# Patient Record
Sex: Male | Born: 1940 | Hispanic: Yes | Marital: Married | Smoking: Never smoker
Health system: Southern US, Community
[De-identification: ages and names within clinical notes are randomized; demographics above are authoritative.]

## PROBLEM LIST (undated history)

## (undated) DIAGNOSIS — K219 Gastro-esophageal reflux disease without esophagitis: Secondary | ICD-10-CM

## (undated) DIAGNOSIS — I10 Essential (primary) hypertension: Secondary | ICD-10-CM

## (undated) DIAGNOSIS — M1990 Unspecified osteoarthritis, unspecified site: Secondary | ICD-10-CM

## (undated) DIAGNOSIS — M549 Dorsalgia, unspecified: Secondary | ICD-10-CM

## (undated) DIAGNOSIS — D649 Anemia, unspecified: Secondary | ICD-10-CM

## (undated) DIAGNOSIS — E785 Hyperlipidemia, unspecified: Secondary | ICD-10-CM

## (undated) DIAGNOSIS — N184 Chronic kidney disease, stage 4 (severe): Secondary | ICD-10-CM

## (undated) DIAGNOSIS — E119 Type 2 diabetes mellitus without complications: Secondary | ICD-10-CM

## (undated) DIAGNOSIS — A0472 Enterocolitis due to Clostridium difficile, not specified as recurrent: Secondary | ICD-10-CM

## (undated) DIAGNOSIS — N400 Enlarged prostate without lower urinary tract symptoms: Secondary | ICD-10-CM

## (undated) HISTORY — DX: Hyperlipidemia, unspecified: E78.5

## (undated) HISTORY — PX: OTHER SURGICAL HISTORY: SHX169

## (undated) HISTORY — PX: CATARACT EXTRACTION: SUR2

## (undated) HISTORY — DX: Type 2 diabetes mellitus without complications: E11.9

## (undated) HISTORY — DX: Essential (primary) hypertension: I10

## (undated) HISTORY — DX: Chronic kidney disease, stage 4 (severe): N18.4

---

## 2004-03-15 ENCOUNTER — Other Ambulatory Visit: Payer: Self-pay

## 2004-09-15 ENCOUNTER — Encounter: Payer: Self-pay | Admitting: Family Medicine

## 2004-10-16 ENCOUNTER — Encounter: Payer: Self-pay | Admitting: Family Medicine

## 2005-07-21 ENCOUNTER — Emergency Department: Payer: Self-pay | Admitting: General Practice

## 2005-07-26 ENCOUNTER — Emergency Department: Payer: Self-pay | Admitting: Emergency Medicine

## 2005-07-28 ENCOUNTER — Emergency Department: Payer: Self-pay | Admitting: Emergency Medicine

## 2007-10-13 ENCOUNTER — Emergency Department: Payer: Self-pay | Admitting: Emergency Medicine

## 2009-03-09 ENCOUNTER — Ambulatory Visit: Payer: Self-pay | Admitting: Family Medicine

## 2009-05-31 ENCOUNTER — Emergency Department: Payer: Self-pay | Admitting: Emergency Medicine

## 2012-12-28 ENCOUNTER — Ambulatory Visit: Payer: Self-pay | Admitting: Surgery

## 2012-12-28 LAB — BASIC METABOLIC PANEL
Anion Gap: 7 (ref 7–16)
Calcium, Total: 8.6 mg/dL (ref 8.5–10.1)
Chloride: 111 mmol/L — ABNORMAL HIGH (ref 98–107)
Co2: 24 mmol/L (ref 21–32)
EGFR (African American): 34 — ABNORMAL LOW
EGFR (Non-African Amer.): 29 — ABNORMAL LOW
Glucose: 153 mg/dL — ABNORMAL HIGH (ref 65–99)
Potassium: 3.7 mmol/L (ref 3.5–5.1)
Sodium: 142 mmol/L (ref 136–145)

## 2012-12-28 LAB — CBC
HCT: 33.4 % — ABNORMAL LOW (ref 40.0–52.0)
HGB: 11.3 g/dL — ABNORMAL LOW (ref 13.0–18.0)
MCHC: 33.8 g/dL (ref 32.0–36.0)
MCV: 89 fL (ref 80–100)
Platelet: 218 10*3/uL (ref 150–440)
RBC: 3.76 10*6/uL — ABNORMAL LOW (ref 4.40–5.90)
RDW: 13.2 % (ref 11.5–14.5)
WBC: 6.8 10*3/uL (ref 3.8–10.6)

## 2012-12-28 LAB — PROTIME-INR
INR: 1
Prothrombin Time: 13.3 secs (ref 11.5–14.7)

## 2013-01-01 ENCOUNTER — Ambulatory Visit: Payer: Self-pay | Admitting: Surgery

## 2013-08-29 ENCOUNTER — Inpatient Hospital Stay: Payer: Self-pay | Admitting: Internal Medicine

## 2013-08-29 LAB — COMPREHENSIVE METABOLIC PANEL
Albumin: 2.9 g/dL — ABNORMAL LOW (ref 3.4–5.0)
Alkaline Phosphatase: 109 U/L (ref 50–136)
Anion Gap: 8 (ref 7–16)
BUN: 46 mg/dL — ABNORMAL HIGH (ref 7–18)
Calcium, Total: 7.9 mg/dL — ABNORMAL LOW (ref 8.5–10.1)
Co2: 20 mmol/L — ABNORMAL LOW (ref 21–32)
EGFR (African American): 21 — ABNORMAL LOW
EGFR (Non-African Amer.): 18 — ABNORMAL LOW
Glucose: 214 mg/dL — ABNORMAL HIGH (ref 65–99)
Osmolality: 292 (ref 275–301)
Potassium: 3.6 mmol/L (ref 3.5–5.1)
SGOT(AST): 22 U/L (ref 15–37)
SGPT (ALT): 19 U/L (ref 12–78)
Sodium: 137 mmol/L (ref 136–145)
Total Protein: 6.9 g/dL (ref 6.4–8.2)

## 2013-08-29 LAB — URINALYSIS, COMPLETE
Glucose,UR: 50 mg/dL (ref 0–75)
Ketone: NEGATIVE
Specific Gravity: 1.015 (ref 1.003–1.030)
Squamous Epithelial: 1

## 2013-08-29 LAB — CBC WITH DIFFERENTIAL/PLATELET
Basophil #: 0 10*3/uL (ref 0.0–0.1)
Basophil %: 0 %
Eosinophil #: 0 10*3/uL (ref 0.0–0.7)
Eosinophil %: 0 %
Lymphocyte %: 2.7 %
MCHC: 33.5 g/dL (ref 32.0–36.0)
MCV: 88 fL (ref 80–100)
Monocyte %: 6 %
Neutrophil #: 28.5 10*3/uL — ABNORMAL HIGH (ref 1.4–6.5)
Neutrophil %: 91.3 %
Platelet: 195 10*3/uL (ref 150–440)
RBC: 3.54 10*6/uL — ABNORMAL LOW (ref 4.40–5.90)
WBC: 31.3 10*3/uL — ABNORMAL HIGH (ref 3.8–10.6)

## 2013-08-30 LAB — BASIC METABOLIC PANEL
Anion Gap: 8 (ref 7–16)
BUN: 52 mg/dL — ABNORMAL HIGH (ref 7–18)
Chloride: 114 mmol/L — ABNORMAL HIGH (ref 98–107)
Co2: 19 mmol/L — ABNORMAL LOW (ref 21–32)
EGFR (African American): 23 — ABNORMAL LOW
EGFR (Non-African Amer.): 20 — ABNORMAL LOW
Glucose: 82 mg/dL (ref 65–99)
Osmolality: 294 (ref 275–301)

## 2013-08-30 LAB — CBC WITH DIFFERENTIAL/PLATELET
HCT: 29.8 % — ABNORMAL LOW (ref 40.0–52.0)
Lymphocyte %: 7.5 %
MCHC: 34 g/dL (ref 32.0–36.0)
MCV: 89 fL (ref 80–100)
Monocyte #: 1.8 x10 3/mm — ABNORMAL HIGH (ref 0.2–1.0)
Monocyte %: 6.2 %
Neutrophil #: 24.4 10*3/uL — ABNORMAL HIGH (ref 1.4–6.5)
Neutrophil %: 85.9 %
RBC: 3.34 10*6/uL — ABNORMAL LOW (ref 4.40–5.90)
WBC: 28.4 10*3/uL — ABNORMAL HIGH (ref 3.8–10.6)

## 2013-08-30 LAB — SODIUM, URINE, RANDOM: Sodium, Urine Random: 25 mmol/L (ref 20–110)

## 2013-08-31 LAB — BASIC METABOLIC PANEL
Calcium, Total: 7.9 mg/dL — ABNORMAL LOW (ref 8.5–10.1)
Chloride: 114 mmol/L — ABNORMAL HIGH (ref 98–107)
Co2: 20 mmol/L — ABNORMAL LOW (ref 21–32)
EGFR (Non-African Amer.): 22 — ABNORMAL LOW
Glucose: 94 mg/dL (ref 65–99)
Potassium: 3.6 mmol/L (ref 3.5–5.1)

## 2013-08-31 LAB — CBC WITH DIFFERENTIAL/PLATELET
Basophil %: 0.2 %
HCT: 27.4 % — ABNORMAL LOW (ref 40.0–52.0)
HGB: 9.3 g/dL — ABNORMAL LOW (ref 13.0–18.0)
Lymphocyte #: 1.5 10*3/uL (ref 1.0–3.6)
Lymphocyte %: 6.6 %
MCHC: 34 g/dL (ref 32.0–36.0)
Monocyte #: 1.2 x10 3/mm — ABNORMAL HIGH (ref 0.2–1.0)
Monocyte %: 5.5 %
Neutrophil #: 19.6 10*3/uL — ABNORMAL HIGH (ref 1.4–6.5)
Neutrophil %: 87.1 %
Platelet: 171 10*3/uL (ref 150–440)
RBC: 3.05 10*6/uL — ABNORMAL LOW (ref 4.40–5.90)
RDW: 14.1 % (ref 11.5–14.5)
WBC: 22.5 10*3/uL — ABNORMAL HIGH (ref 3.8–10.6)

## 2013-09-01 LAB — BASIC METABOLIC PANEL
Anion Gap: 8 (ref 7–16)
Calcium, Total: 8.1 mg/dL — ABNORMAL LOW (ref 8.5–10.1)
Chloride: 115 mmol/L — ABNORMAL HIGH (ref 98–107)
Creatinine: 2.43 mg/dL — ABNORMAL HIGH (ref 0.60–1.30)
EGFR (Non-African Amer.): 25 — ABNORMAL LOW
Glucose: 87 mg/dL (ref 65–99)
Osmolality: 293 (ref 275–301)
Potassium: 3.7 mmol/L (ref 3.5–5.1)
Sodium: 142 mmol/L (ref 136–145)

## 2013-09-01 LAB — CBC WITH DIFFERENTIAL/PLATELET
Basophil #: 0 10*3/uL (ref 0.0–0.1)
Basophil %: 0.4 %
Eosinophil #: 0.3 10*3/uL (ref 0.0–0.7)
Eosinophil %: 2.5 %
HCT: 27.3 % — ABNORMAL LOW (ref 40.0–52.0)
HGB: 9.2 g/dL — ABNORMAL LOW (ref 13.0–18.0)
MCH: 30.2 pg (ref 26.0–34.0)
Monocyte #: 0.8 x10 3/mm (ref 0.2–1.0)
Monocyte %: 6.2 %
Neutrophil #: 9 10*3/uL — ABNORMAL HIGH (ref 1.4–6.5)
Neutrophil %: 74.4 %
Platelet: 181 10*3/uL (ref 150–440)
RBC: 3.06 10*6/uL — ABNORMAL LOW (ref 4.40–5.90)

## 2013-09-02 LAB — CBC WITH DIFFERENTIAL/PLATELET
Basophil #: 0.1 10*3/uL (ref 0.0–0.1)
Basophil %: 1 %
Eosinophil #: 0.2 10*3/uL (ref 0.0–0.7)
HCT: 27 % — ABNORMAL LOW (ref 40.0–52.0)
Lymphocyte #: 1.7 10*3/uL (ref 1.0–3.6)
MCH: 30.7 pg (ref 26.0–34.0)
MCHC: 34.5 g/dL (ref 32.0–36.0)
Monocyte #: 0.7 x10 3/mm (ref 0.2–1.0)
Monocyte %: 9 %
Neutrophil #: 5.1 10*3/uL (ref 1.4–6.5)
Platelet: 184 10*3/uL (ref 150–440)
RDW: 13.7 % (ref 11.5–14.5)
WBC: 7.8 10*3/uL (ref 3.8–10.6)

## 2013-09-02 LAB — BASIC METABOLIC PANEL
Anion Gap: 7 (ref 7–16)
BUN: 36 mg/dL — ABNORMAL HIGH (ref 7–18)
Calcium, Total: 8.5 mg/dL (ref 8.5–10.1)
Chloride: 114 mmol/L — ABNORMAL HIGH (ref 98–107)
Co2: 20 mmol/L — ABNORMAL LOW (ref 21–32)
Creatinine: 2.22 mg/dL — ABNORMAL HIGH (ref 0.60–1.30)
EGFR (African American): 33 — ABNORMAL LOW
EGFR (Non-African Amer.): 28 — ABNORMAL LOW
Osmolality: 290 (ref 275–301)

## 2013-09-07 DIAGNOSIS — N401 Enlarged prostate with lower urinary tract symptoms: Secondary | ICD-10-CM | POA: Insufficient documentation

## 2014-04-13 ENCOUNTER — Emergency Department: Payer: Self-pay | Admitting: Emergency Medicine

## 2014-04-13 LAB — BASIC METABOLIC PANEL
Anion Gap: 7 (ref 7–16)
BUN: 34 mg/dL — AB (ref 7–18)
CALCIUM: 8.3 mg/dL — AB (ref 8.5–10.1)
CHLORIDE: 113 mmol/L — AB (ref 98–107)
Co2: 22 mmol/L (ref 21–32)
Creatinine: 2.5 mg/dL — ABNORMAL HIGH (ref 0.60–1.30)
GFR CALC AF AMER: 28 — AB
GFR CALC NON AF AMER: 25 — AB
Glucose: 202 mg/dL — ABNORMAL HIGH (ref 65–99)
OSMOLALITY: 296 (ref 275–301)
Potassium: 3.8 mmol/L (ref 3.5–5.1)
Sodium: 142 mmol/L (ref 136–145)

## 2014-04-13 LAB — TROPONIN I
Troponin-I: 0.09 ng/mL — ABNORMAL HIGH
Troponin-I: 0.08 ng/mL — ABNORMAL HIGH

## 2014-04-13 LAB — CBC
HCT: 30.4 % — ABNORMAL LOW (ref 40.0–52.0)
HGB: 10.1 g/dL — ABNORMAL LOW (ref 13.0–18.0)
MCH: 30.3 pg (ref 26.0–34.0)
MCHC: 33.1 g/dL (ref 32.0–36.0)
MCV: 91 fL (ref 80–100)
Platelet: 221 10*3/uL (ref 150–440)
RBC: 3.33 10*6/uL — AB (ref 4.40–5.90)
RDW: 13.6 % (ref 11.5–14.5)
WBC: 6.2 10*3/uL (ref 3.8–10.6)

## 2014-04-13 LAB — PRO B NATRIURETIC PEPTIDE: B-Type Natriuretic Peptide: 2286 pg/mL — ABNORMAL HIGH (ref 0–125)

## 2014-05-11 ENCOUNTER — Ambulatory Visit: Payer: Self-pay | Admitting: Ophthalmology

## 2015-02-01 ENCOUNTER — Inpatient Hospital Stay: Payer: Self-pay | Admitting: Internal Medicine

## 2015-02-06 ENCOUNTER — Emergency Department: Payer: Self-pay | Admitting: Emergency Medicine

## 2015-02-07 ENCOUNTER — Other Ambulatory Visit: Payer: Self-pay | Admitting: Physician Assistant

## 2015-02-07 DIAGNOSIS — R079 Chest pain, unspecified: Secondary | ICD-10-CM

## 2015-02-07 DIAGNOSIS — N189 Chronic kidney disease, unspecified: Secondary | ICD-10-CM

## 2015-02-07 DIAGNOSIS — N179 Acute kidney failure, unspecified: Secondary | ICD-10-CM

## 2015-02-07 DIAGNOSIS — I10 Essential (primary) hypertension: Secondary | ICD-10-CM

## 2015-02-10 ENCOUNTER — Inpatient Hospital Stay: Payer: Self-pay | Admitting: Internal Medicine

## 2015-02-15 ENCOUNTER — Ambulatory Visit (INDEPENDENT_AMBULATORY_CARE_PROVIDER_SITE_OTHER): Payer: Medicare Other | Admitting: Cardiovascular Disease

## 2015-02-15 ENCOUNTER — Encounter (INDEPENDENT_AMBULATORY_CARE_PROVIDER_SITE_OTHER): Payer: Self-pay

## 2015-02-15 ENCOUNTER — Encounter: Payer: Self-pay | Admitting: Cardiovascular Disease

## 2015-02-15 VITALS — BP 140/54 | HR 82 | Ht 69.0 in | Wt 194.5 lb

## 2015-02-15 DIAGNOSIS — R519 Headache, unspecified: Secondary | ICD-10-CM | POA: Insufficient documentation

## 2015-02-15 DIAGNOSIS — N184 Chronic kidney disease, stage 4 (severe): Secondary | ICD-10-CM | POA: Insufficient documentation

## 2015-02-15 DIAGNOSIS — R079 Chest pain, unspecified: Secondary | ICD-10-CM | POA: Insufficient documentation

## 2015-02-15 DIAGNOSIS — M542 Cervicalgia: Secondary | ICD-10-CM | POA: Diagnosis not present

## 2015-02-15 DIAGNOSIS — G8929 Other chronic pain: Secondary | ICD-10-CM

## 2015-02-15 DIAGNOSIS — E1165 Type 2 diabetes mellitus with hyperglycemia: Secondary | ICD-10-CM | POA: Diagnosis not present

## 2015-02-15 DIAGNOSIS — R51 Headache: Secondary | ICD-10-CM | POA: Diagnosis not present

## 2015-02-15 DIAGNOSIS — IMO0002 Reserved for concepts with insufficient information to code with codable children: Secondary | ICD-10-CM

## 2015-02-15 NOTE — Patient Instructions (Addendum)
You are doing well. No medication changes were made.  We will schedule you for a lexiscan pharmacologic myoview (stress test) [place]  Your caregiver has ordered a Stress Test with nuclear imaging. The purpose of this test is to evaluate the blood supply to your heart muscle. This procedure is referred to as a "Non-Invasive Stress Test." This is because other than having an IV started in your vein, nothing is inserted or "invades" your body. Cardiac stress tests are done to find areas of poor blood flow to the heart by determining the extent of coronary artery disease (CAD). Some patients exercise on a treadmill, which naturally increases the blood flow to your heart, while others who are  unable to walk on a treadmill due to physical limitations have a pharmacologic/chemical stress agent called Lexiscan . This medicine will mimic walking on a treadmill by temporarily increasing your coronary blood flow.   Please note: these test may take anywhere between 2-4 hours to complete  PLEASE REPORT TO [facility] AT THE FIRST DESK WILL DIRECT YOU WHERE TO GO  Date of Procedure:_____Monday, March 7______  Arrival Time for Procedure:______9:45 am__________  Instructions regarding medication:   __x__:  Hold medications as follows:__Do not take the amlodipine or metoprolol the night before or the morning of your stress test. __________  PLEASE NOTIFY THE OFFICE AT LEAST 24 HOURS IN ADVANCE IF YOU ARE UNABLE TO [place].  [phone] AND  PLEASE NOTIFY NUCLEAR MEDICINE AT [facility] AT LEAST 24 HOURS IN ADVANCE IF YOU ARE UNABLE TO KEEP YOUR APPOINTMENT. [phone]  How to prepare for your Myoview test:  1. Do not eat or drink after midnight 2. No caffeine for 24 hours prior to test 3. No smoking 24 hours prior to test. 4. Your medication may be taken with water.  If your doctor stopped a medication because of this test, do not take that  medication. 5. Ladies, please do not wear dresses.  Skirts or pants are appropriate. Please wear a short sleeve shirt. 6. No perfume, cologne or lotion. 7. Wear comfortable walking shoes. No heels!  Hold the amlodipine and metoprolol the evening before and morning of the test No food the morning of the test No caffeine for 24 hours before the test  Please call us if you have new issues that need to be addressed before your next appt.  Your physician wants you to follow-up in: 6 months.  You will receive a reminder letter in the mail two months in advance. If you don't receive a letter, please call our office to schedule the follow-up appointment.

## 2015-02-15 NOTE — Assessment & Plan Note (Signed)
Recommended close follow-up with primary care. Will likely require endocrine if unable to be controlled with his current medications

## 2015-02-15 NOTE — Assessment & Plan Note (Signed)
Severe headaches and neck pain, likely arthritis. He has seen primary care and was given Flexeril. He reports this has not helped his symptoms. He is unable to take NSAIDs secondary to underlying renal failure. Will refer to the pain clinic. Might need injections or pain medication. He denies having any x-rays or MRI

## 2015-02-15 NOTE — Assessment & Plan Note (Signed)
Suspect symptoms are from arthritis or musculoskeletal. May need imaging such as x-rays, even MRI

## 2015-02-15 NOTE — Assessment & Plan Note (Signed)
Follow-up with renal. Underlying renal failure likely secondary to long history of poor control diabetes

## 2015-02-15 NOTE — Assessment & Plan Note (Signed)
He is high risk of coronary artery disease. Frequent visits to the hospital with admission for neck pain, headache, chest pain. Stress test has been ordered to rule out ischemia

## 2015-02-15 NOTE — Progress Notes (Signed)
Patient ID: Martin Gilbert, male    DOB: 1941/07/27, 74 y.o.   MRN: [id number]  HPI Comments: [name] is a 74 year old gentleman with poorly controlled diabetes, chronic renal failure, hypertension, recently presenting to [facility] 02/07/2015 with chest pain, neck pain, headaches. He presents to establish care in the clinic He has been in the hospital 3 times in February most recently February 26 with discharge 02/12/2015 with headache  In the hospital February 17 to 02/03/2015 for acute on chronic kidney disease, hypertension, lower extremity swelling. Echocardiogram at that time showed normal ejection fraction Primary care had been treating leg edema with Lasix. Lower extremity Doppler ruled out DVT. Lasix likely contributed to worsening renal failure. Seen by renal, started on fluids, Lasix was held. Renal function improved to baseline, creatinine 3.4. Ultrasound showed chronic medical renal disease He was discharged with hydralazine.  Most recent hospital admission he had VQ scan and ruled out for PE. D-dimer was mildly elevated He presents today for discussion of stress testing. He continues to have neck pain and headaches. Occasional chest pain. CT scan of the head in the hospital showed no abnormalities He was felt his headache and neck pain were possibly from arthritis or musculoskeletal etiology. The symptoms have been ongoing for several months  hes not very active at baseline secondary to his neck and headaches.     No Known Allergies  Outpatient Encounter Prescriptions as of 02/15/2015  Medication Sig  . acetaminophen (TYLENOL) 500 MG tablet Take 500 mg by mouth every 8 (eight) hours as needed.  Marland Kitchen amLODipine (NORVASC) 2.5 MG tablet Take 2.5 mg by mouth daily.  Marland Kitchen aspirin 81 MG tablet Take 81 mg by mouth daily.  Marland Kitchen atorvastatin (LIPITOR) 20 MG tablet Take 20 mg by mouth daily.  . ciprofloxacin (CIPRO) 250 MG tablet Take 250 mg by mouth 2 (two) times daily.  .  cyclobenzaprine (FLEXERIL) 10 MG tablet Take 10 mg by mouth 3 (three) times daily as needed for muscle spasms.  Marland Kitchen doxazosin (CARDURA) 2 MG tablet Take 2 mg by mouth daily.  Marland Kitchen glipiZIDE (GLUCOTROL XL) 2.5 MG 24 hr tablet Take 2.5 mg by mouth daily with breakfast.  . hydrALAZINE (APRESOLINE) 50 MG tablet Take 50 mg by mouth 3 (three) times daily.  . metoprolol succinate (TOPROL-XL) 25 MG 24 hr tablet Take 25 mg by mouth daily.  . sodium bicarbonate 650 MG tablet Take 650 mg by mouth 4 (four) times daily.  . Vitamin D, Cholecalciferol, 1000 UNITS TABS Take by mouth.    Past Medical History  Diagnosis Date  . CKD (chronic kidney disease), stage IV   . HTN (hypertension)   . Diabetes mellitus without complication   . Hyperlipidemia     Past Surgical History  Procedure Laterality Date  . Hernia repair      Social History  reports that he has never smoked. He does not have any smokeless tobacco history on file. He reports that he does not drink alcohol or use illicit drugs.  Family History Family history is unknown by patient.    Review of Systems  Constitutional: Negative.   Respiratory: Negative.   Cardiovascular: Negative.   Gastrointestinal: Negative.   Musculoskeletal: Negative.   Skin: Negative.   Neurological: Negative.   Hematological: Negative.   Psychiatric/Behavioral: Negative.   All other systems reviewed and are negative.   BP 140/54 mmHg  Pulse 82  Ht 5\' 9"  (1.753 m)  Wt 194 lb 8 oz (88.225 kg)  BMI 28.71 kg/m2  Physical Exam  Constitutional: He is oriented to person, place, and time. He appears well-developed and well-nourished.  HENT:  Head: Normocephalic.  Nose: Nose normal.  Mouth/Throat: Oropharynx is clear and moist.  Eyes: Conjunctivae are normal. Pupils are equal, round, and reactive to light.  Neck: Normal range of motion. Neck supple. No JVD present.  Cardiovascular: Normal rate, regular rhythm, S1 normal, S2 normal, normal heart sounds and  intact distal pulses.  Exam reveals no gallop and no friction rub.   No murmur heard. Pulmonary/Chest: Effort normal and breath sounds normal. No respiratory distress. He has no wheezes. He has no rales. He exhibits no tenderness.  Abdominal: Soft. Bowel sounds are normal. He exhibits no distension. There is no tenderness.  Musculoskeletal: Normal range of motion. He exhibits no edema or tenderness.  Lymphadenopathy:    He has no cervical adenopathy.  Neurological: He is alert and oriented to person, place, and time. Coordination normal.  Skin: Skin is warm and dry. No rash noted. No erythema.  Psychiatric: He has a normal mood and affect. His behavior is normal. Judgment and thought content normal.      Assessment and Plan   Nursing note and vitals reviewed.

## 2015-02-20 ENCOUNTER — Other Ambulatory Visit: Payer: Self-pay

## 2015-02-20 ENCOUNTER — Ambulatory Visit: Payer: Self-pay | Admitting: Cardiovascular Disease

## 2015-02-20 DIAGNOSIS — R079 Chest pain, unspecified: Secondary | ICD-10-CM

## 2015-02-27 ENCOUNTER — Emergency Department: Payer: Self-pay | Admitting: Student

## 2015-04-07 NOTE — Discharge Summary (Signed)
PATIENT NAME:  Martin Gilbert, Martin [name]:  [id number] DATE OF BIRTH:  1941-06-07  DATE OF ADMISSION:  08/29/2013 DATE OF DISCHARGE:  09/02/2013  PRIMARY CARE PHYSICIAN:  [facility].  DISCHARGE DIAGNOSES: 1.  Urinary tract infection.  2.  Sepsis.  3.  Acute renal failure.  4.  Urinary retention. 5.  Diabetes mellitus.  6.  Benign prostatic hypertrophy.   CONSULTANTS:  None.   IMAGING STUDIES DONE: Include an ultrasound of the kidneys bilaterally showed moderate postvoid residual urinary bladder volume. Enlargement of the prostate gland. Normal kidneys.   ADMITTING HISTORY AND PHYSICAL:  Please see detailed H and P dictated by Dr. Lavetta Nielsen on 08/29/2013.  In brief, a 74 year old Hispanic male patient with past history type 2 diabetes mellitus, hypertension, CKD, presented to the hospital with 2 days of fever, chills and dysuria. The patient was found to have sepsis, UTI, admitted to the hospitalist service.   HOSPITAL COURSE:  Urinary tract infection with sepsis.  Urine cultures were not sent, but patient has responded well to Levaquin with no fever, normal white count. He did have acute renal failure for which ultrasound of the kidneys was done which showed no hydronephrosis secondary to his benign prostatic hypertrophy and ongoing UTI. The patient did have urinary retention for which a Foley has been placed. A Foley removal trial was done, but the patient could not void and he has been started on Flomax, Foley placed back. He will be given a leg bag and the patient will follow up with urology in 1 to 2 weeks. The patient's acute renal failure has resolved. He does have CKD stage III with baseline creatinine of 2.1.   Today, the patient does not have any abdominal pain on exam. Cardiac exam shows S1, S2, without any tachycardia or murmurs.   DISCHARGE MEDICATIONS: Include:  1.  Flomax 0.4 mg oral once a day. 2.  Doxazosin 8 mg oral once a day.  3.  Quinapril 20 mg oral once a day.   4.  Sodium bicarbonate 650 mg 2 tablets oral 2 times a day.  5.  Multivitamin 1 tablet oral once a day. 6.  Glucotrol XL 5 mg oral extended release 2 times a day.  7.  Levaquin 750 mg oral every other day for 6 days.   DISCHARGE INSTRUCTIONS:  Diabetic diet. Activity as tolerated. Follow up urology in 1 to 2 weeks regarding Foley catheter. The patient will also follow up with primary care physician in a week.   TIME SPENT:  On day of discharge in discharge activity was 45 minutes.   ____________________________ Leia Alf Sheniqua Carolan, MD srs:ce D: 09/02/2013 15:28:41 ET T: 09/02/2013 16:57:49 ET JOB#: [id number]  cc: Alveta Heimlich R. Sarahann Horrell, MD, <Dictator> Kelley MD ELECTRONICALLY SIGNED 09/06/2013 10:41

## 2015-04-07 NOTE — Op Note (Signed)
PATIENT NAME:  Martin Gilbert, Martin [name]:  [id number] DATE OF BIRTH:  09/17/1941  DATE OF PROCEDURE:  01/01/2013  PREOPERATIVE DIAGNOSIS: Umbilical hernia.   POSTOPERATIVE DIAGNOSIS: Umbilical hernia.   PROCEDURE: Umbilical hernia repair.   SURGEON: Rochel Brome, MD   ANESTHESIA: General.   INDICATIONS: This 74 year old has had bulging at the umbilicus with associated pain. Hernia was demonstrated on physical exam and appeared to be the cause of the pain, and repair was recommended for definitive treatment.   DESCRIPTION OF PROCEDURE: The patient was placed on the operating table in the supine position under general anesthesia. The abdomen was prepared with ChloraPrep and draped in a sterile manner.   A transversely oriented supraumbilical incision was made some 3 cm in length, carried down through subcutaneous tissues to encounter an umbilical hernia which consisted primarily of herniated properitoneal fat. This bulging properitoneal fat created a mass some 3 cm in dimension. It was dissected free from surrounding structures and was reduced through the fascial ring defect. The defect itself was approximately 12 mm in dimension. The properitoneal fat was dissected away from the fascia circumferentially. The fascia appeared to be somewhat thin, and I elected to use Atrium mesh which was cut to create an oval shape of some 1.5 x 2.5 cm and was placed into the properitoneal plane and sutured to the overlying fascia above and below the defect with through-and-through 0 Surgilon sutures. Next, the repair was further carried out with a transversely oriented suture line of interrupted 0 Surgilon figure-of-eight sutures, incorporating each suture into the mesh. The repair looked good. It was noted that hemostasis was intact. The tissues surrounding the repair were infiltrated with 0.5% Sensorcaine with epinephrine. The skin was closed with running 5-0 Monocryl subcuticular suture and Dermabond. The patient  tolerated surgery satisfactorily and was then prepared for transfer to the recovery room.   ____________________________ Lenna Sciara. Rochel Brome, MD jws:OSi D: 01/01/2013 14:21:01 ET T: 01/02/2013 09:29:49 ET JOB#: [id number]  cc: Loreli Dollar, MD, <Dictator> Loreli Dollar MD ELECTRONICALLY SIGNED 01/04/2013 10:24

## 2015-04-07 NOTE — H&P (Signed)
PATIENT NAME:  Martin Gilbert, Misty MR#:  [id number] DATE OF BIRTH:  06/16/1941  DATE OF ADMISSION:  08/29/2013  REFERRING PHYSICIAN:   Dr. Kaminski PRIMARY CARE PHYSICIAN: [facility]     HISTORY OF PRESENT ILLNESS:  [name] is a 74-year-old Hispanic gentleman who speaks [place] dialect of Spanish with past medical history of type 2 diabetes, hypertension as well as chronic kidney disease who is presenting with 2-day duration of fever, chills and dysuria. He also notes increased urinary frequency and describes burning sensation whenever he urinates. He also mentions abdominal pain present only when urinating which is a burning sensation, intensity ranging between 2 and 4 out of 10 without radiation. No relieving factors. He has noted subjective fevers and chills but did not actually measure his temperature at home. In the Emergency Department work-up, he was found to have urinary tract infection with marked leukocytosis above 30,000.    REVIEW OF SYSTEMS: CONSTITUTIONAL: Fever, chills as above.  He also mentions generalized fatigue and weakness.  EYES: No double vision or pain.  EARS, NOSE AND THROAT:  No hearing loss or dysphagia.  RESPIRATORY: Denies cough or shortness of breath.  CARDIOVASCULAR: No chest pain, palpitations.  GASTROINTESTINAL: No nausea, vomiting, diarrhea. Abdominal pain mentioned as above.  GENITOURINARY: Once again, positive for dysuria, increased frequency, denies any hematuria.  ENDOCRINE: Denies nocturia or thyroid problems.  HEMATOLOGIC/LYMPHATIC: Denies easy bruising or bleeding.  SKIN: Denies any rashes or lesions.  MUSCULOSKELETAL: Denies any arthritis or cramps.  NEUROLOGIC: No paralysis or paresthesias.  PSYCHIATRIC: No anxiety or depressive symptoms.  Otherwise, full review of systems performed by me, with the assistance of daughter for translation, negative.   PAST MEDICAL HISTORY: Diabetes, hypertension, chronic kidney disease.  FAMILY  HISTORY: No known history of heart disease or kidney disease.   SOCIAL HISTORY: Previously drank alcohol, has not for many years. Denies any tobacco or drug usage.   ALLERGIES: TYLENOL, WHICH CAUSES LIP SWELLING.  HOME MEDICATIONS: Doxazosin 8 mg p.o. daily, Glucotrol XL 5 mg p.o. b.i.d., multivitamin 1 tab p.o. daily, quinapril 20 mg p.o. daily, sodium bicarb 650 mg 2 tabs p.o. b.i.d.   PHYSICAL EXAMINATION: VITAL SIGNS: Temperature 99.1, heart rate 70, respirations 18, blood pressure 125/60, saturating 96% on room air. Weight 80.3 kg, BMI 29.5.  GENERAL: No acute distress, awake, alert and oriented. Speaks [place], requires translation through his daughter who is at bedside.  Our Spanish interpreter is unable to speak the dialect of Spanish, and we have no interpreters available via telephone.  HEENT: Normocephalic, atraumatic. Extraocular muscles intact. Pupils equal, round, reactive to light as well as accommodation. Dry mucosal membranes.  CARDIOVASCULAR: S1, S2, regular rate and rhythm. No murmurs, rubs or gallops.  PULMONARY: Clear to auscultation bilaterally without wheezes, rubs or rhonchi.  ABDOMEN: Soft, nontender, nondistended with positive bowel sounds. There is no CVA tenderness noted.  EXTREMITIES: Reveal no cyanosis, edema or clubbing.  NEUROLOGIC: Cranial nerves II through XII intact. No gross focal neurological deficits.   LABORATORY DATA: Sodium 137, potassium 3.6, chloride 109, bicarb 20, BUN 46, creatinine 3.16 with the last known reading of 2.18 in January of 2014. Calcium of 7.9, protein 6.9, albumin 2.9, bilirubin 2.4, alk phos 109, AST 22, ALT 19. WBC 31.3, which is neutrophil predominant, absolute neutrophil count to 28.5, hemoglobin 10.5, MCV of 88, RDW of 14, platelets 195. Urinalysis: Urine turbid with glucosuria of 50, protein 150, leukocyte esterase 3+, nitrite negative, RBCs 140, WBC is 965, bacteria   3+, epithelial cells 1.   ASSESSMENT AND PLAN: A 74-year-old  Hispanic gentleman who speaks [place], history of diabetes, hypertension, chronic kidney disease, presenting with 2 days' duration of fevers, chills, dysuria.   1.  Sepsis secondary to urinary source, meets sepsis criteria with presenting heart rate as well as white count. Panculture including blood and urine. Follow culture data.  He has been given Levaquin thus far. We will continue that for antibiotic coverage. IV fluids. Keep MAP greater than 65. 2.  Acute kidney injury on chronic kidney disease, likely prerenal in nature.  Check urine sodium and creatinine to calculate FENa as well as order a renal ultrasound. Continue IV fluid hydration. Hold ACE inhibitor and avoid further nephrotoxic agents.  3.  Hypertension. Hold his ACE inhibitor.  If needed, can use hydralazine versus Norvasc. 4.  Type 2 diabetes. Hold p.o. agents, place on insulin sliding scale.   CODE STATUS:  The patient is a FULL CODE.     TIME SPENT: 33 minutes.   ____________________________  K. , MD dkh:cb D: 08/29/2013 23:33:50 ET T: 08/29/2013 23:46:54 ET JOB#: [id number]  cc:  K. , MD, <Dictator>  K  MD ELECTRONICALLY SIGNED 08/30/2013 1:32 

## 2015-04-08 ENCOUNTER — Emergency Department
Admit: 2015-04-08 | Disposition: A | Discharge status: Home / Self Care | Payer: Self-pay | Admitting: Emergency Medicine

## 2015-04-08 LAB — BASIC METABOLIC PANEL
ANION GAP: 5 — AB (ref 7–16)
BUN: 60 mg/dL — AB
CHLORIDE: 108 mmol/L
Calcium, Total: 8.5 mg/dL — ABNORMAL LOW
Co2: 25 mmol/L
Creatinine: 4.04 mg/dL — ABNORMAL HIGH
EGFR (African American): 16 — ABNORMAL LOW
EGFR (Non-African Amer.): 14 — ABNORMAL LOW
Glucose: 181 mg/dL — ABNORMAL HIGH
POTASSIUM: 4 mmol/L
Sodium: 138 mmol/L

## 2015-04-08 LAB — CBC
HCT: 28.5 % — ABNORMAL LOW (ref 40.0–52.0)
HGB: 9.5 g/dL — AB (ref 13.0–18.0)
MCH: 30.3 pg (ref 26.0–34.0)
MCHC: 33.4 g/dL (ref 32.0–36.0)
MCV: 91 fL (ref 80–100)
PLATELETS: 302 10*3/uL (ref 150–440)
RBC: 3.14 10*6/uL — AB (ref 4.40–5.90)
RDW: 13 % (ref 11.5–14.5)
WBC: 9.3 10*3/uL (ref 3.8–10.6)

## 2015-04-08 LAB — TROPONIN I: Troponin-I: 0.04 ng/mL — ABNORMAL HIGH

## 2015-04-09 LAB — HEPATIC FUNCTION PANEL A (ARMC)
Albumin: 3.1 g/dL — ABNORMAL LOW
Alkaline Phosphatase: 104 U/L
Bilirubin,Total: 0.5 mg/dL
SGOT(AST): 19 U/L
SGPT (ALT): 13 U/L — ABNORMAL LOW
Total Protein: 7 g/dL

## 2015-04-09 LAB — LIPASE, BLOOD: LIPASE: 46 U/L

## 2015-04-09 LAB — TSH: THYROID STIMULATING HORM: 2.355 u[IU]/mL

## 2015-04-09 LAB — TROPONIN I: TROPONIN-I: 0.04 ng/mL — AB

## 2015-04-09 LAB — CK: CK, TOTAL: 59 U/L

## 2015-04-16 NOTE — Consult Note (Signed)
General Aspect Primary Cardiologist: New to [facility] ____________  74 year old male with history of CKD stage IV, HTN, and DM2 who was recently admitted to [facility] from 2/17-2/19 for acute on CKD and lower extremity swelling presents to [facility] this morning with continued intermittent chest pain, neck pain, and headache.  __________  PMH: 1. CKD stage IV 2. HTN 3. DM2 ___________   Present Illness 74 year old male who is spanish speaking with the above problem list presents to [facility] today with the above CC.  He has never seen a cardiologist previously. No prior ischemic evaluation. During his recent hospitalization from 2/17-2/19 for acute on CKD, accelerated HTN (182/71), and lower extremity swelling he did undergo an echo that showed an EF of 55-60%. At that time he was sent to [facility] from his PCP office 2/2 worsening renal failure and increased lower extremity swelling. PCP had increased his Lasix in an effort to diurese him. He underwent lower extremity doppler which was negative for DVT. He was seen by renal. He received IV fluids which did not significantly improve renal function. His Lasix was stopped. Eventually, his renal did return back to baseline, around 3.4. Ultrasound of his kidneys shows chronic medical renal disease, prostate enlarged, bladder distention. He was discharged with the addition of hydralazine 50 mg tid 2/2 his accelerated HTN.  He comes into [facility] today with continued intermittent chest pain, posterior neck pain, and right sided headache. The chest pain has been present since his above admission. He initially thought he slept wrong while he was admitted, but the pain persisted. He then developed the posterior neck pain and headache. Some associated SOB, nausea, palpitations, and diaphoresis the prior night. No presyncope or syncope. Currently chest pain free. Pain is not exertional as he does not exert himself ever he states. Pain occurs when at rest. He is a non-smoker. Previously  drank moderately years ago. No illicit drugs. He does not know his family medical history as his parents passed when he was young. He does not know his siblings PMH. His lower extremity swelling has resolved. His weight has remained stable. No orthopnea. His daughter is here to translate.   Upon his arrival to [facility] ED his troponin was found to be 0.13-->0.13-->0.11 (previously 0.08 x 2 during last admission, and during April 2015 troponin was 0.09). SCr 3.79. CXR with minimal subsegmental left lower lobe atelectasis. CT head negative.   Physical Exam:  GEN well developed, well nourished, no acute distress   HEENT hearing intact to voice, moist oral mucosa   NECK No masses  no JVD   RESP normal resp effort  clear BS   CARD Regular rate and rhythm  No murmur   ABD denies tenderness  soft   LYMPH negative neck   EXTR negative edema   SKIN normal to palpation   NEURO cranial nerves intact, motor/sensory function intact   PSYCH alert, A+O to time, place, person, good insight   Review of Systems:  Subjective/Chief Complaint leg swelling   General: No Complaints   Skin: No Complaints   ENT: No Complaints   Eyes: No Complaints   Neck: No Complaints   Respiratory: Short of breath   Cardiovascular: Chest pain or discomfort  Edema   Gastrointestinal: Nausea   Genitourinary: No Complaints   Vascular: No Complaints   Musculoskeletal: No Complaints   Neurologic: No Complaints   Hematologic: No Complaints   Endocrine: No Complaints   Psychiatric: No Complaints   Review  of Systems: All other systems were reviewed and found to be negative   Medications/Allergies Reviewed Medications/Allergies reviewed   Family & Social History:  Family and Social History:  Family History Negative  patient is unsure   Social History negative tobacco, negative ETOH, negative Illicit drugs   Place of Living Home     ckd stage iv:    Hyperlipidemia:    HTN:    Diabetes:    Home Medications:  Medication Instructions Status  hydrALAZINE 50 mg oral tablet 1 tab(s) orally 3 times a day Active  amLODIPine 2.5 mg oral tablet 1 tab(s) orally once a day Active  glipiZIDE 2.5 mg oral tablet, extended release 1 tab(s) orally once a day Active  Vitamin D3 1000 intl units oral tablet 1 tab(s) orally once a day Active  sodium bicarbonate 650 mg oral tablet 2 tab(s) orally 2 times a day Active  doxazosin 2 mg oral tablet 1 tab(s) orally once a day (at bedtime) Active  aspirin 81 mg oral tablet 1 tab(s) orally once a day Active   Lab Results:  Routine Chem:  22-Feb-16 22:40   Result Comment TROPONIN - RESULTS VERIFIED BY REPEAT TESTING.  - C/ JENNIFER MEADOWS '@[id number]'  02-06-15.Marland KitchenAJO  - READ-BACK PROCESS PERFORMED.  Result(s) reported on 07 Feb 2015 at 12:03AM.  Lipase 298 (Result(s) reported on 07 Feb 2015 at 12:30AM.)  Glucose, Serum 82  BUN  42  Creatinine (comp)  3.79  Sodium, Serum 141  Potassium, Serum 4.2  Chloride, Serum  111  CO2, Serum 21  Calcium (Total), Serum  7.9  Anion Gap 9  Osmolality (calc) 291  eGFR (African American)  20  eGFR (Non-African American)  17 (eGFR values <77m/min/1.73 m2 may be an indication of chronic kidney disease (CKD). Calculated eGFR, using the MRDR Study equation, is useful in  patients with stable renal function. The eGFR calculation will not be reliable in acutely ill patients when serum creatinine is changing rapidly. It is not useful in patients on dialysis. The eGFR calculation may not be applicable to patients at the low and high extremes of body sizes, pregnant women, and vegetarians.)  Cardiac:  22-Feb-16 22:40   Troponin I  0.13 (0.00-0.05 0.05 ng/mL or less: NEGATIVE  Repeat testing in 3-6 hrs  if clinically indicated. >0.05 ng/mL: POTENTIAL  MYOCARDIAL INJURY. Repeat  testing in 3-6 hrs if  clinically indicated. NOTE: An increase or decrease  of 30% or more on serial  testing suggests a clinically  important change)  23-Feb-16 04:24   Troponin I  0.13 (0.00-0.05 0.05 ng/mL or less: NEGATIVE  Repeat testing in 3-6 hrs  if clinically indicated. >0.05 ng/mL: POTENTIAL  MYOCARDIAL INJURY. Repeat  testing in 3-6 hrs if  clinically indicated. NOTE: An increase or decrease  of 30% or more on serial  testing suggests a  clinically important change)    08:28   Troponin I  0.11 (0.00-0.05 0.05 ng/mL or less: NEGATIVE  Repeat testing in 3-6 hrs  if clinically indicated. >0.05 ng/mL: POTENTIAL  MYOCARDIAL INJURY. Repeat  testing in 3-6 hrs if  clinically indicated. NOTE: An increase or decrease  of 30% or more on serial  testing suggests a  clinically important change)  Routine Coag:  22-Feb-16 22:40   D-Dimer, Quantitative 813 (INTERPRETATION <> Exclusion of Venous Thromboembolism (VTE) - OUTPATIENT ONLY       (Emergency Department or [place])             0-499 ng/ml (FEU)  :  With a low to intermediate pretest                                  probability for VTE this test result                                  excludes the diagnosis of VTE.             > 499 ng/ml (FEU)  : VTE not excluded; additional work up                                  for VTE is required. <> Testing on Inpatients and Evaluation of Disseminated Intravascular        Coagulation (DIC)             Reference Range:  0-499 ng/ml (FEU))  Routine Hem:  22-Feb-16 22:40   WBC (CBC) 7.3  RBC (CBC)  3.15  Hemoglobin (CBC)  9.9  Hematocrit (CBC)  28.6  Platelet Count (CBC) 209 (Result(s) reported on 06 Feb 2015 at 10:58PM.)  MCV 91  MCH 31.4  MCHC 34.6  RDW 13.2   EKG:  Interpretation EKG with NSR with no significant ST or T wave changes   Radiology Results:  XRay:    23-Feb-16 00:51, Chest PA and Lateral  Chest PA and Lateral   REASON FOR EXAM:    chest pain and SOB eval  COMMENTS:       PROCEDURE: DXR - DXR CHEST PA (OR AP) AND LATERAL  - Feb 07 2015 12:51AM     CLINICAL DATA:  Chest pain for 4  days.    EXAM:  CHEST  2 VIEW    COMPARISON:  04/13/2014    FINDINGS:  The cardiomediastinal contours are normal. Minimal subsegmental  atelectasis in the left lower lobe. Pulmonary vasculature is normal.  No consolidation, pleural effusion, or pneumothorax. No acute  osseous abnormalities are seen. Stable degenerative change inthe  thoracic spine.     IMPRESSION:  Minimal subsegmental left lower lobe atelectasis.      Electronically Signed    By: Jeb Levering M.D.    On: 02/07/2015 01:04         Verified By: Rollene Fare. Marisue Humble, M.D.,  CT:    23-Feb-16 00:46, CT Head Without Contrast  CT Head Without Contrast   REASON FOR EXAM:    headache x 4 days  COMMENTS:       PROCEDURE: CT  - CT HEAD WITHOUT CONTRAST  - Feb 07 2015 12:46AM     CLINICAL DATA:  Headache for 4 days.    EXAM:  CT HEAD WITHOUT CONTRAST    TECHNIQUE:  Contiguous axial images were obtained from the base of the skull  through the vertex without intravenous contrast.    COMPARISON:  None.  FINDINGS:  No intracranial hemorrhage, mass effect, or midline shift. No  hydrocephalus. The basilar cisterns are patent. Question low lying  cerebral tonsils. No evidence of territorial infarct. There is  periventricular and deep white matter hypodensity, nonspecific,  likely related to chronic small vessel ischemia. No intracranial  fluid collection. Calvarium is intact. Included paranasal sinuses  and mastoid air cells are well aerated.     IMPRESSION:  No acute intracranial abnormality.  Electronically Signed    By: Jeb Levering M.D.    On: 02/07/2015 01:02         Verified By: Rollene Fare. Marisue Humble, M.D.,  Nuclear Med:    23-Feb-16 12:21, Lung VQ Scan - Nuc Med  Lung VQ Scan - Nuc Med   REASON FOR EXAM:    elevaed d-dimer, chest pain  COMMENTS:       PROCEDURE: NM  - NM VQ LUNG SCAN  - Feb 07 2015 12:21PM     CLINICAL DATA:  Elevated D-dimer and chest pain    EXAM:  NUCLEAR  MEDICINE VENTILATION - PERFUSION LUNG SCAN    TECHNIQUE:  Ventilation images were obtained in multiple projections using  inhaled aerosol technetium 99 M DTPA. Perfusion images were obtained  in multiple projections after intravenous injection of Tc-42mMAA.  RADIOPHARMACEUTICALS:  43.3 mCi Tc-969mTPA aerosol and 4.39 mCi  Tc-9931mA    COMPARISON:  Chest x-ray from earlier in the same day    FINDINGS:  Ventilation: No focal ventilation defect.    Perfusion: No wedge shaped peripheral perfusion defects to suggest  acute pulmonary embolism.     IMPRESSION:  No evidence of pulmonary embolism.    Electronically Signed    By: MarInez CatalinaD.    On: 02/07/2015 12:45         Verified By: MAREverlene Farrier.D.,    Tylenol: Swelling   Impression [age]ar old male with history of CKD stage IV, HTN, and DM2 who [facility] acute on CKD and lower extremity swelling [facility] with continued intermittent chest pain, neck pain, and headache.   1. Atypical chest pain: -No prior ischemic evaluations -Patient is scheduled for VQ scan today -He will need Lexiscan to evaluate for high risk ischemia (will need to wait 48 hours post VQ scan),  could be done as an outpatient would follow up in clinic   2. Elevated D-dimer: VQ normal could be discharged with outpatient follow up for Lexiscan  3. HTN: -Improved from previous admission -Add Toprol 25 mg daily -Continue hydralazine 50 mg tid and amlodipine 2.5 mg daily  4. Headache and neck pain: -Unclear etiology -CT head negative -No signs of infection -Check sed rate  5. Acute on CKD stage IV: -Monitor SCr follow up with renal  6. Lower extremity swelling: -Improved -If this becomes an issue could look at changing amlodipine lasix PRN   Electronic Signatures: DunRise MuA-C)  (Signed 23-Feb-16 11:09)  Authored: General Aspect/Present Illness, History and Physical  Exam, Review of System, Family & Social History, Past Medical History, Home Medications, Labs, Radiology, Allergies, Impression/Plan GolIda RogueD)  (Signed 23-Feb-16 14:06)  Authored: General Aspect/Present Illness, History and Physical Exam, Review of System, Family & Social History, Past Medical History, Home Medications, EKG , Radiology, Impression/Plan  Co-Signer: General Aspect/Present Illness, History and Physical Exam, Review of System, Family & Social History, Past Medical History, Home Medications, Labs, Radiology, Allergies, Impression/Plan   Last Updated: 23-Feb-16 14:06 by GolIda RogueD)

## 2015-04-16 NOTE — H&P (Signed)
PATIENT NAME:  Martin Gilbert, Martin [name]:  [id number] DATE OF BIRTH:  May 31, 1941  DATE OF ADMISSION:  02/06/2015  PRIMARY CARE PHYSICIAN: Nonlocal    REFERRING PHYSICIAN: Loney Hering, MD    CHIEF COMPLAINT: Chest pain and headache for 4 days.   HISTORY OF PRESENT ILLNESS: A 74 years old Spanish-speaking gentleman had presented to the ED with above chief complaint. The patient is alert, awake, oriented. According to patient, patient has had chest pain and headache for the past 4 days. Chest pain is in the substernal area, tight, without radiation. In addition, the patient has a headache, which is constant. In addition, the patient complains of neck pain and back pain. The patient denies any nausea, vomiting, or diarrhea. No cough, sputum, or shortness of breath. The patient was just discharged 5 days ago after treatment of acute renal failure. The patient has elevated troponin 0.13 and elevated D-dimer at 813.     PAST MEDICAL HISTORY: Hypertension, diabetes, acute renal failure, and CKD.   SOCIAL HISTORY: The patient was alcoholic previously but for the last 3 to 4 years, stopped drinking. Denies any alcohol drinking, smoking, or illicit drugs. 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   FAMILY HISTORY: Negative for diabetes and CAD.   SURGICAL HISTORY: none.    ALLERGY: TYLENOL.   HOME MEDICATIONS: Vitamin D3 1000 international units 1 tablet p.o. daily, sodium bicarbonate 650 mg p.o. 2 tablets b.i.d., hydralazine 50 mg p.o. t.i.d., glypizide 2.5 mg p.o. daily, doxazosin 2 mg p.o. daily at bedtime, aspirin 81 mg daily, Norvasc 2.5 mg p.o. daily.    REVIEW OF SYSTEMS: CONSTITUTIONAL: The patient denies any fever, chills but has headache and neck pain.  EYES: No double vision or blurred vision.  EARS, NOSE, THROAT: No postnasal drip, slurred speech, or dysphagia.  CARDIOVASCULAR: Positive for chest tightness. No palpitation, orthopnea, nocturnal dyspnea. Has  leg edema.  PULMONARY: No cough, sputum, shortness of breath, or hematemesis.  GASTROINTESTINAL: No abdominal pain, nausea, vomiting, or diarrhea. No melena or bloody stool.  GENITOURINARY: No dysuria, hematuria, or incontinence but has urine frequency and difficulty to void urine.   HEMATOLOGIC: No easy bruising or bleeding.  NEUROLOGY: No syncope, loss of consciousness, or seizure.   PHYSICAL EXAMINATION: VITAL SIGNS: Temperature 98.2, blood pressure 135/65, pulse 71, O2 saturation 96% in room air.  GENERAL: The patient is alert, awake, oriented, in no acute distress.  HEENT: Pupils round, equal, react to light and accommodation.  NECK: Supple. No JVD or carotid bruit. No lymphadenopathy. No thyromegaly.  CARDIOVASCULAR: S1, S2, regular rate, rhythm. No murmurs or gallop.  PULMONARY: Bilateral air entry. No wheezing or rales. No use of accessory muscles to breathe.  ABDOMEN: Soft and no distention or tenderness. No organomegaly. Bowel sounds present.  EXTREMITIES: Bilateral lower extremity edema 1+. No clubbing or cyanosis. No calf tenderness. Bilateral pedal pulses present.  SKIN: No rash or jaundice.  NEUROLOGIC: A and O x 3. No focal deficit. Power 5/5. Sensation intact.  The patient has tenderness on the head and neck.   LABORATORY DATA: Glucose 82, BUN 42, creatinine 3.79, sodium 141, potassium 4.2, chloride 111, bicarbonate 21, lipase 298, troponin 0.13, WBC 7.3, hemoglobin 9.9, platelets 209. D-dimer 813.   Chest x-ray: Minimal subsegmental left lower lobe atelectasis. CAT scan of head: No acute intracranial abnormality. EKG showed normal sinus rhythm at 89 BPM.   IMPRESSIONS:  1.  Headache and neck pain, unclear etiology.  2.  Chest pain with  elevated troponin.  3.  Elevated D-dimer.  4.  Acute renal failure on chronic kidney disease.  5.  Diabetes.  6.  Hypertension.   PLAN OF TREATMENT:  1.  The patient will be placed for observation. Continue aspirin, give Zocor, and  follow up troponin level, give Lipitor, follow up troponin level.  2.  For elevated D-dimer, we will get a V/Q scan to rule out PE. Since the patient has acute renal failure on chronic kidney disease, we cannot get a CT angiogram at this time.  3.  For diabetes, we will start sliding scale, hold glipizide.  4.  For hypertension, continue patient's hypertension medication.  5.  Discussed the patient's condition and plan of treatment with the patient and patient's wife.   TIME SPENT: About 68 minutes.   All the information is obtained with Spanish interpreter.   The patient wants full code.   ____________________________ Demetrios Loll, MD qc:AT D: 02/07/2015 04:27:32 ET T: 02/07/2015 05:11:43 ET JOB#: [id number]  cc: Demetrios Loll, MD, <Dictator> Demetrios Loll MD ELECTRONICALLY SIGNED 02/09/2015 16:19

## 2015-04-16 NOTE — Discharge Summary (Signed)
PATIENT NAME:  Martin Gilbert, Martin [name]:  [id number] DATE OF BIRTH:  09-Apr-1941  DATE OF ADMISSION:  02/01/2015 DATE OF DISCHARGE:  02/03/2015  ADMITTING DIAGNOSES:  1.  Swelling of the lower extremity.  2.  Acute renal failure.   DISCHARGE DIAGNOSES: 1.  Acute renal failure on chronic renal failure. Likely there is progression of his renal disease. No improvement with IV hydration. Avoid diuretic therapy.  2.  Hypertension essential.  3.  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  4.  Elevated troponin, likely due to demand ischemia.  5.  Anemia, likely anemia of chronic disease.   CONSULTANTS: Dr. Lynnea Ferrier of [facility] nephrology.   PERTINENT LABORATORIES AND EVALUATIONS: Admitting glucose 197, BUN 42, creatinine 3.57, sodium 142, potassium 4.3, chloride 109, CO2 of 25, calcium 8.3. LFTs showed a total protein of 5.8, albumin 2.3, bilirubin total 0.5, alkaline phosphatase 108, troponin was 0.08, WBC 7.2, hemoglobin 10.7, platelet count was 233,000. X-ray of the foot shows no acute abnormality. Bilateral Doppler of the lower extremities shows no evidence of DVT. Ultrasound of his kidneys shows chronic medical renal disease, prostate enlarged, bladder distention.   HOSPITAL COURSE: Please refer to H and P done by the admitting physician. The patient is a 74 year old Spanish male who was sent from his primary care's office due to worsening renal failure. The patient recently had Lasix increased due to swelling of the lower extremities. The patient came to the ED also with complaint of swelling of the legs. He was noted to have a creatinine that was worsening. He was given aggressive IV fluids. By day 2 his renal function did not show much improvement. He was seen by primary nephrology team. They recommended stopping the IV fluids, stopping his Lasix. The patient's renal function remains stable but elevated compared to baseline. He needs this monitored. He has an appointment on March 2  which he needs to keep. At that time, renal function will need to be repeated. At this time he is stable for discharge.   DISCHARGE MEDICATIONS: Amlodipine 2.5 p.o. daily, glipizide 2.5 mg 1 tab p.o. daily, vitamin D3 at 1000 international units daily, sodium bicarbonate 650 two tablets 2 times a day, doxazosin 2 mg at bedtime, aspirin 81 mg 1 tab p.o. daily, hydralazine 50 one tablet p.o. t.i.d.   DIET: Low-sodium, low-fat, low-cholesterol, carbohydrate -controlled diet.   ACTIVITY: As tolerated.   DISCHARGE FOLLOWUP: Follow with [facility] nephrology on 03/02 as previously scheduled. The patient to wear compression stockings at all times. If they become uncomfortable, okay to take off a few hours.    TIME SPENT: 35 minutes on this discharge.   ____________________________ Lafonda Mosses. Posey Pronto, MD shp:at D: 02/03/2015 14:59:00 ET T: 02/03/2015 15:55:06 ET JOB#: [id number]  cc: Marieelena Bartko H. Posey Pronto, MD, <Dictator> Alric Seton MD ELECTRONICALLY SIGNED 02/11/2015 10:32

## 2015-04-16 NOTE — H&P (Signed)
PATIENT NAME:  Martin Gilbert, Martin [name]:  [id number] DATE OF BIRTH:  27-Apr-1941  DATE OF ADMISSION:  02/01/2015  PRIMARY CARE PHYSICIAN: [facility]    PRIMARY NEPHROLOGIST: At [facility]   REFERRING EMERGENCY ROOM PHYSICIAN: Dr. Archie Balboa.   CHIEF COMPLAINT: Swelling on the legs.   HISTORY OF PRESENT ILLNESS: A 74 year old Spanish-speaking man, who has a history of diabetes, hypertension, and chronic kidney disease. Lives with family. He has been having swelling on his legs for the last few days or weeks, so he went to his doctor's office at [facility] and they gave him Lasix, 3 tablets, which he took for the last 3 days. He also had complained of some pain on his foot, which is mainly on the left side and the left leg also appears to be a little more swollen compared to the right one. So finally, after the 3 days of Lasix therapy when he did not benefit that much, he decided to come to the Emergency Room. In the ER, he was noted to have worsening in his baseline renal function. Creatinine was around 2.5 baseline and noted to have more than 3 over here so  hospitalist team for further management of this issue.   I obtained history from patient and his family with the help of Spanish translation from the hospital and the patient denies any fall or injury to his left leg. He denies any shortness of breath or orthopnea symptoms or any palpitations or chest pain. He denies any cough, and he claims to be taking all of his medications, regular basis.   REVIEW OF SYSTEMS:  CONSTITUTIONAL: Negative for fever, fatigue, weakness, pain or weight loss.  EYES: No blurring, double vision, discharge or redness.  EARS, NOSE, THROAT: No tinnitus, ear pain or hearing loss.  RESPIRATORY: No cough, wheezing, shortness of breath and.  CARDIOVASCULAR: No chest pain, orthopnea, edema, arrhythmia, palpitation.  GASTROINTESTINAL: No nausea, vomiting, diarrhea, abdominal pain.  GENITOURINARY: No  dysuria, hematuria, increased frequency.  ENDOCRINE: No heat or cold intolerance.  SKIN: No excessive sweating, rashes, or lesions.  MUSCULOSKELETAL: No pain or swelling in the joints, except for his left side foot.  NEUROLOGICAL: No numbness, weakness, tremor or vertigo.  PSYCHIATRIC: No anxiety, insomnia, bipolar disorder.   PAST MEDICAL HISTORY:  1. Hypertension.  2. Diabetes.  3. Chronic renal failure.   SOCIAL HISTORY: He was an alcoholic previously, but for the last 3 to 4 years, stopped drinking. Denies smoking or illegal drug use. 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   FAMILY HISTORY: Negative for diabetes and coronary artery disease.   HOME MEDICATIONS:  1. Vitamin D3, 1000 international units once a day.  2. Sodium bicarbonate 650 mg 2 tablets 2 times a day.  3. Glipizide 2.5 mg oral once a day.  4. Furosemide 20 mg oral once a day.  5. Doxazosin 2 mg oral tablet once a day.  6. Aspirin 81 mg once a day.  7. Amlodipine 2.5 mg once a day.   PHYSICAL EXAMINATION:  VITAL SIGNS: Temperature 98.6, pulse 62, respirations 19, blood pressure 182/71, and pulse oximetry is 97% on room air.  GENERAL: The patient is fully alert and oriented to time, place, and person. Does not appear in any acute distress.  HEENT: Head and neck atraumatic. Conjunctivae pink. Oral mucosa moist.  NECK: Supple. No JVD. Thyroid nontender.  RESPIRATORY: Bilateral equal and clear air entry.  CARDIOVASCULAR: S1, S2 present,  regular. No murmur appreciated.  ABDOMEN: Soft, nontender. Bowel sounds present. No organomegaly.  SKIN: No acne, rashes, or lesions.  MUSCULOSKELETAL: No tenderness or swelling in the joints.  NEUROLOGICAL: Power 5/5, follows commands and moves all four limbs. No tremor or rigidity.  LEGS: On the left side leg, there is more edema compared to the right one. The edema is pitting and he has some tenderness present on his foot. Pulsations are  intact in both side dorsalis pedis. There is no redness of the skin. Tenderness is present on left side foot on the dorsal and ventral aspect.  PSYCHIATRIC: Does not appear in acute psychiatric illness at this time.   IMPORTANT LABORATORY RESULTS: Troponin 0.08, WBC 7.2, hemoglobin 10.7, platelet count 233,000, MCV is 91. Glucose 197, BUN 42, creatinine 3.57, sodium 142, potassium 4.3, chloride 109, CO2 25. Calcium is 8.3. BNP is 1338.   Left foot x-ray shows no acute finding.   ASSESSMENT AND PLAN: A 74 year old male, who has past history of chronic renal failure and hypertension and diabetes, who came to the Emergency Room with complaints of leg swelling for the last few days and was given Lasix as outpatient, has worsening in renal function.   1. Acute on chronic renal failure. Creatinine is worsening. Will stop his Lasix and continue monitoring. Does not want to give any IV fluids because of renal issues and elevated BNP. We will get consult of nephrology with his primary nephrology team at [facility]  2. Leg edema. This problem the patient has had for the last few days to a week. We will check an echocardiogram to rule out any cardiac weakness that and we will also get deep vein thrombosis studies. Meanwhile, we will continue monitoring.  3. Hypertension. We will continue his home medications.  4. Diabetes. We will stop glipizide because of the renal failure and continue insulin on sliding scale coverage.   CODE STATUS: Full code.   TOTAL TIME SPENT ON THIS ADMISSION: 50 minutes.   ____________________________ Ceasar Lund Anselm Jungling, MD vgv:ap D: 02/01/2015 21:43:37 ET T: 02/01/2015 22:46:26 ET JOB#: [id number]  cc: Ceasar Lund. Anselm Jungling, MD, <Dictator> [place] MD ELECTRONICALLY SIGNED 02/27/2015 12:49

## 2015-04-16 NOTE — Discharge Summary (Signed)
PATIENT NAME:  LANGLEY, KROM MR#:  [id number] DATE OF BIRTH:  1941-07-26  DATE OF ADMISSION:  02/06/2015 DATE OF DISCHARGE:  02/07/2015  DISCHARGE DIAGNOSES:  1.  Musculoskeletal chest pain,  diabetes mellitus,  headache, and neck pain.  2.  Hypertension.  3.  Chronic kidney disease stage 4.   DISCHARGE MEDICATIONS:   1.  Glipizide 2.5 mg p.o. daily.  2.  Vitamin D 1000 units once a day.  3.  Sodium bicarbonate 650 mg 2 tablets p.o. b.i.d.  4.  Doxazocin 2 mg p.o. daily.  5.  Aspirin 81 mg p.o. daily.  6.  Hydralazine 50 mg p.o. t.i.d.  7.  Amlodipine 2.5 mg p.o. daily.  8.  Atorvastatin 20 mg p.o. daily.  9.  Metoprolol succinate 25 mg daily (new medication)  DIET: Low-sodium bite.  CONSULTATION: Cardiology consult with Dr. Rockey Situ from [place].  The patient went to follow Dr. Rockey Situ in about a week for outpatient stress test.   HOSPITAL COURSE: A 74 year old male patient with CKD stage IV, hypertension, diabetes mellitus was admitted because of chest pain. The patient was here recently from 02/01/2015 to 02/03/2015 for acute on chronic renal failure. The patient came in because of chest pain and neck pain and headache. The patient also had multiple other complaints of neck pain and headache as well. The patient also had some associated trouble breathing, nausea, and palpitations.  He did not have any syncope. The patient's troponin was around 0.13, 0.10, and 0.11. The patient's chest x-ray showed some left lower lobe atelectasis. CT of the head was unremarkable. He was seen by Dr. Rockey Situ from cardiology and the patient's D-dimer was slightly high, so he had a V/Q scan which was negative so he has to wait about 48 hours post VQ scan to have a Lexiscan stress test. The patient was advised that he can have a stress test as an outpatient and his chest pain is thought to be secondary to musculoskeletal.  The patient is given an appointment to follow up with Dr.  Rockey Situ.   Hypertension. The patient is on hydralazine and amlodipine and we added Toprol-XL 25 mg daily.   Headache and neck pain likely due to viral and his symptoms resolved.   Chronic kidney disease stage 4.  The patient will follow up with the kidney doctor.  Lower extremity swelling improved. If the patient's swelling continues, amlodipine can be discontinued. The daughter is very helpful with the patient and the translation. The patient understood all of the directions and he was eager to go home. He did not have chest pain or neck pain at the time of discharge.   DISCHARGE VITAL SIGNS: Temperature 98.2 Fahrenheit, heart rate is 66, blood pressure 129/67, saturation 97% on room air.   PHYSICAL EXAMINATION:  CARDIOVASCULAR: S1, S2 regular.  LUNGS: Clear to auscultation. No wheeze. No rales.  ABDOMEN: Soft, nontender, nondistended. Bowel sounds present.   LABORATORY DATA: Electrolytes on admission, sodium 141, potassium 4.2, chloride 111, bicarbonate 21, BUN 42, creatinine 3.7, glucose 82. Troponin 0.13 and 0.13. The patient's CT head was unremarkable. CBC: WBC 7.3, hemoglobin 9.9, hematocrit 28.6, platelets 209,000.  D-dimer 8.13.  V/Q scan is negative for pulmonary emboli.   TIME SPENT: More than 30 minutes.   ____________________________ Epifanio Lesches, MD sk:mc D: 02/08/2015 19:08:00 ET T: 02/09/2015 11:44:38 ET JOB#: [id number]  cc: Epifanio Lesches, MD, <Dictator> Epifanio Lesches MD ELECTRONICALLY SIGNED 02/20/2015 13:16

## 2015-04-16 NOTE — H&P (Signed)
PATIENT NAME:  Martin, [name]:  [id number] DATE OF BIRTH:  [birth year]  DATE OF ADMISSION:  02/10/2015  REFERRING PHYSICIAN: Lurline Hare, MD   PRIMARY CARE PHYSICIAN: Nonlocal.   ADMISSION DIAGNOSIS: Acute on chronic kidney failure.   HISTORY OF PRESENT ILLNESS: This is a 74 year old Hispanic gentleman, who presents to the Emergency Department complaining of headache, posterior neck pain, occasional abdomen and chest pain. The patient states that the latter is intermittent. He denies any associated shortness of breath, nausea, diaphoresis, or numbness in his arms or jaw. He admits that he has felt very cold lately, but denies any fevers. He states that the aforementioned symptoms have been ongoing intermittently since last week when he was admitted to the hospital for the same. He states that all the symptoms seem to be worse at this point and he is concerned that they  have not been addressed by the last hospital admission. In the Emergency Department, the patient was found to have a worsening renal function. Due to his recurrent symptoms and worsening laboratory values, Emergency Department called for admission.   REVIEW OF SYSTEMS:  CONSTITUTIONAL: The patient denies fevers, but admits to chills and general malaise.  EYES: Denies blurred vision or inflammation.  EARS, NOSE AND THROAT: Denies tinnitus or sore throat.  RESPIRATORY: Denies cough or shortness of breath.  CARDIOVASCULAR: Admits to intermittent chest pain that is sharp in character. He denies palpitations or dyspnea on exertion, or paroxysmal nocturnal dyspnea.  GASTROINTESTINAL: Denies nausea, vomiting, diarrhea, or abdominal pain.  GENITOURINARY: Denies dysuria, increased frequency, or hesitancy of urination.  ENDOCRINE: Denies polyuria or polydipsia.  HEMATOLOGIC AND LYMPHATIC: Denies easy bruising or bleeding.  INTEGUMENTARY: Denies rashes or lesions.  NEUROLOGIC: Denies numbness in his extremities or dysarthria.   PSYCHIATRIC: Denies depression or suicidal ideation.   PAST MEDICAL HISTORY: Hypertension, diabetes type 2, chronic kidney disease, and prostatic hypertrophy with questionable malignancy.   PAST SURGICAL HISTORY: Abdominal hernia repair.   SOCIAL HISTORY: The patient does not smoke, drink, or do any drugs. He lives with his wife.   FAMILY HISTORY: He is unaware of any chronic kidney disease in his family members and does not know his brothers and sisters very well, thus, he cannot contribute much to this part of his medical record.   MEDICATIONS:  1. Amlodipine 2.5 mg 1 tablet p.o. daily.  2. Aspirin 81 mg 1 tablet p.o. daily.  3. Atorvastatin 20 mg 1 tablet p.o. daily.  4. Doxazosin 2 mg 1 tablet p.o. at bedtime.  5. Glipizide 2.5 mg extended release 1 tablet p.o. daily.  6. Hydralazine 50 mg 1 tablet p.o. t.i.d.  7. Metoprolol succinate 25 mg extended release tablet 1 tab p.o. daily.  8. Sodium bicarbonate 650 mg 2 tablets p.o. b.i.d.  9. Vitamin D3, 1000 international units 1 tablet p.o. daily.   ALLERGIES: TYLENOL.   PERTINENT LABORATORY RESULTS AND RADIOGRAPHIC FINDINGS: Serum glucose is 111, BUN is 57, creatinine 4.26, serum sodium 141, potassium is 4.1, chloride is 111, bicarbonate is 19, calcium is 8.2. Lipase is 193, serum albumin is 2.7, alkaline phosphatase 127, AST is 31, ALT is 20 total bilirubin 0.5. Troponin is 0.12. White blood cell count is 7, hemoglobin is 9.2, hematocrit is 28.3, platelet count 227,000. MCV is 91. Urinalysis shows 17 white blood cells per high-power field, as well as 2 red blood cells per high-power field, 1+ leukocyte esterase and 3+ bacteria.   Chest x-ray shows shallow inspiration with linear atelectasis at the  left lung base and ultrasound of the abdomen shows mild hepatic steatosis, as well as normal gallbladder and no biliary distention.   PHYSICAL EXAMINATION:  VITAL SIGNS: Temperature is 98.4, pulse 80, respirations 20, blood pressure 152/65,  pulse oximetry is 94% on room air.  GENERAL: The patient is alert and oriented, in no apparent distress. He is somnolent, but easily arousable.  HEENT: Normocephalic, atraumatic. Pupils equal, round, and reactive to light and accommodation. Extraocular movements are intact. Mucous membranes are moist.  NECK: Trachea is midline. No adenopathy. Thyroid is nonpalpable and nontender.  CHEST: Symmetric and atraumatic.  CARDIOVASCULAR: Regular rate and rhythm. Normal S1, S2. No rubs, clicks, or murmurs appreciated.  LUNGS: Clear to auscultation bilaterally. Normal effort and excursion.  ABDOMEN: Positive bowel sounds. Soft, nontender. No hepatosplenomegaly. The patient's abdomen does feel distended.  GENITOURINARY: Deferred.  MUSCULOSKELETAL: The patient moves all 4 extremities equally. I have not tested his gait.  EXTREMITIES: No clubbing, cyanosis, but there is some edema of his lower extremities.  SKIN: Warm and dry. There are no rashes or lesions. The patient is jaundiced. NEUROLOGIC: Cranial nerves II through XII are grossly intact.  PSYCHIATRIC: Mood is normal. Affect is congruent. The patient appears to have good insight and judgment into his medical condition.   ASSESSMENT AND PLAN: This is a 74 year old male admitted for acute on chronic kidney failure.   1. Acute on chronic kidney disease, currently stage IV. We will give the patient intravenous hydration and avoid nephrotoxic agents. I have ordered a nephrology consult.  2. Hypertension. We will continue the patient's home regimen.  3. Diabetes type 2.  We will hold the patient's oral glycemic agents for now, and simply give sliding scale insulin while hospitalized.  4. Prostatic hypertrophy. This diagnosis is unclear. We will obtain a PSA and then an amplified urine DNA test if that appears to be abnormal. We do not have imaging of the prostate at this time either, but the ultrasound of his abdomen at least reveals that he does not have any  metastases that may have infiltrated his liver. We should consider abdominal CT to visualize prostate or any local node involvement.  5. Deep vein thrombosis prophylaxis. Heparin.  6. Gastrointestinal prophylaxis. None.   CODE STATUS: The patient is a full code.   TIME SPENT ON ADMISSION ORDERS AND PATIENT CARE: Approximately 35 minutes.    ____________________________ Norva Riffle. Marcille Blanco, MD msd:ap D: 02/10/2015 08:06:26 ET T: 02/10/2015 08:37:24 ET JOB#: [id number]  cc: Norva Riffle. Marcille Blanco, MD, <Dictator> Norva Riffle Emeli Goguen MD ELECTRONICALLY SIGNED 02/11/2015 19:30

## 2015-04-16 NOTE — Discharge Summary (Signed)
PATIENT NAME:  Martin Gilbert, MOHL MR#:  [id number] DATE OF BIRTH:  [birth year]  DATE OF ADMISSION:  02/10/2015 DATE OF DISCHARGE:  02/12/2015  ADMISSION COMPLAINT:  Headache.   DISCHARGE DIAGNOSES:  1.  Acute on chronic kidney failure stage III. 2.  Headache.  3.  Urinary tract infection versus prostatitis.  4.  Elevated troponin due to cardiac strain and poor renal function.  5.  Diabetes mellitus type 2 with diabetic nephropathy.  6.  Hypertension.  7.  Constipation.   CONSULTATIONS: None.   PROCEDURES: 1.  Chest x-ray shows shallow inspiration and linear atelectasis. No acute changes.  2.  Ultrasound of the abdomen shows mild hepatic steatosis, normal gallbladder. No biliary dilatation.   HISTORY OF PRESENT ILLNESS: This 74 year old Hispanic gentleman presents to the Emergency Room with complaint of headache and neck pain with additional abdominal and chest pain. He was discharged from this facility 3 days prior to his admission and at that time ruled out for ACS. He also complained of chills. No fevers. On presentation his renal function was found to be worse than his baseline and he was admitted for acute renal failure.  HOSPITAL COURSE BY PROBLEM:  1.  Acute on chronic kidney disease stage III+: I called [facility] to discuss his renal function. He has an appointment on Tuesday, 02/14/2015, with them. His renal function at the time of discharge is almost back to his baseline with a creatinine of close to 3. Acute renal failure, likely due to poor p.o. intake and dehydration. He was given IV fluid resuscitation and creatinine improved.  2.  Urinary tract infection versus prostatitis:  The patient complains of lower urinary tract symptoms and has a history of prostatitis in the past. His urinalysis shows 17 white blood cells and unfortunately was not cultured. He was given IV Rocephin while in hospital. He will be treated for prostatitis with a prolonged course of ciprofloxacin.  Note that his PSA was mildly elevated and this should be followed in the outpatient setting once this acute episode has cleared.  3.  Elevated troponin: Please review the last chart from prior discharge. Elevated troponins most likely due to strain. No EKG changes and troponins remained stable from prior admission.  4.  Anemia of chronic disease. No bleeding noted. This is due to renal disease. Hemoglobin fairly stable, slightly decreased after volume resuscitation which is to be expected.  5.  Diabetes mellitus type 2. He will continue on glipizide.  Blood sugars were controlled with sliding scale insulin.  6.  Hypertension: Blood pressure is controlled on hydralazine and amlodipine as well as metoprolol. No changes were made.   HOME MEDICATIONS:  1.  Glipizide 2.5 mg 1 tablet daily.  2.  Vitamin D3 at 1000 international units 1 tablet daily.  3.  Sodium bicarbonate 650 mg 2 tablets twice a day.  4.  Doxazocin 2 mg 1 tablet once a day at bedtime.  5.  Hydralazine 50 mg 1 tablet 3 times a day.  6.  Amlodipine 2.5 mg 1 tablet once a day.  7.  Atorvastatin 20 mg 1 tablet once a day.  8.  Aspirin 81 mg 1 tablet once a day.  9.  Metoprolol succinate extended release 25 mg 1 tablet once a day.  10.  Ciprofloxacin 250 mg 1 tablet every 12 hours for 10 days and then stop.   PHYSICAL EXAMINATION:  VITAL SIGNS: Temperature 97.8, pulse 59, respirations 19, blood pressure 148/63, oxygenation 95% on room air.  GENERAL: No acute distress.  RESPIRATORY: Lungs clear to auscultation bilaterally with good air movement.  CARDIOVASCULAR: Regular rate and rhythm. No murmurs, rubs, or gallops. No peripheral edema. Peripheral pulses 2+.  PSYCHIATRIC: All interviews were performed with the help of a Optometrist. The patient is alert and oriented and should good insight into his clinical condition.   LABORATORY DATA: Sodium 144, potassium 4.0, chloride 115, bicarbonate 20, BUN 42, creatinine 3.4, glucose 81.  Hemoglobin A1c 5.4. Thyroid stimulating hormone 0.93, white blood cells 5.1, hemoglobin 8.3, hematocrit 25.7, platelets 208,000, MCV 91. Urinalysis with 17 white blood cells per high-powered field.   CONDITION ON DISCHARGE: Stable.   DISPOSITION: Discharged to home with no further home health needs.   DISCHARGE INSTRUCTIONS:  DIET: Renal diet.  ACTIVITY: No restrictions.  FOLLOW UP:  Please follow up this week with [facility] as scheduled previously. Follow up within 1-2 weeks with primary care physician.    TIME SPENT ON DISCHARGE: 40 minutes.   ____________________________ Earleen Newport. Volanda Napoleon, MD cpw:mc D: 02/13/2015 20:57:03 ET T: 02/14/2015 09:14:00 ET JOB#: [id number]  cc: Barnetta Chapel P. Volanda Napoleon, MD, <Dictator> [facility] Kelon Easom Christen Butter MD ELECTRONICALLY SIGNED 02/25/2015 10:50

## 2015-04-16 NOTE — Consult Note (Signed)
Admit Diagnosis:   AC ON CH RENAL FAILURE ACUTE ON CHRONIC: Onset Date: 02-Feb-2015, Status: Active, Description: AC ON CH RENAL FAILURE ACUTE ON CHRONIC      Admit Reason:   Acute-on-chronic renal failure (N17.9): Status: Active, Coding System: ICD10, Coded Name: Acute kidney failure, unspecified   General Aspect 74 yo gentleman with CKD IVman with CKD IV, DM, HTN, BPH presents with LE edema not responsive to PO lasix.   Present Illness My Martin Gilbert is a 74 yo gentleman with CKD IV, DM, HTN and BPH. He presented yesterday to ER with worsening of chronic LE edema that was non-responsive to 3 days of 74m PO lasix Rx'd by his PCP. His Cr was noted to be elevated to ~3.5. His BL Cr is 3. He [facility] He has noted increased urination since being here in the hospital despite receiving no further doses of lasix, although he has been getting fluids with medications. He notes improvement in edema since being in the hospital as well. He thinks the compression stockings are really helping. At home he has diabetic socks but not compression stockings.  He also notes that he does all the cooking (his wife is blind), so he is usually up on his feet most of the day. He has nocturia, urinating every 30 min-1 hr. He also notes that he sometimes might use more salt than he should in their cooking. They don't eat out.  No SOB or DOE. He did trip just prior to hospitalization, but he attributes this to age. He did not hurt anything when he tripped.   Home Medications: Medication Instructions Status  furosemide 20 mg oral tablet 1 tab(s) orally once a day (in the morning) Active  amLODIPine 2.5 mg oral tablet 1 tab(s) orally once a day Active  glipiZIDE 2.5 mg oral tablet, extended release 1 tab(s) orally once a day Active  Vitamin D3 1000 intl units oral tablet 1 tab(s) orally once a day Active  sodium bicarbonate 650 mg oral tablet 2 tab(s) orally 2 times a day Active   doxazosin 2 mg oral tablet 1 tab(s) orally once a day (at bedtime) Active  aspirin 81 mg oral tablet 1 tab(s) orally once a day Active    Tylenol: Swelling  Case History and Physical Exam:  Chief Complaint LE edema   Past Medical Health Hypertension, Diabetes Mellitus   Primary Care Provider [facility]  Family History Non-Contributory   HEENT PERLA   Neck/Nodes Supple  No Adenopathy   Chest/Lungs Clear   Cardiovascular No Murmurs or Gallops  Normal Sinus Rhythm   Abdomen Benign   Neurological Grossly WNL   Skin WNL   Nursing/Ancillary Notes: **Vital Signs.:   18-Feb-16 05:13  Celsius 36.7  Pulse Pulse 56  Respirations Respirations 18  Systolic BP Systolic BP 1433 Diastolic BP (mmHg) Diastolic BP (mmHg) 70  Pulse Ox % Pulse Ox % 98    08:49  Pulse Pulse 65  Respirations Respirations 18  Systolic BP Systolic BP 1295 Diastolic BP (mmHg) Diastolic BP (mmHg) 67  Pulse Ox % Pulse Ox % 99  *Intake and Output.:   18-Feb-16 02:59  Grand Totals Intake:   Output:  400    Net:  -400 24 Hr.:  -50  Urine ml     Out:  400    03:00  Grand Totals Intake:  600 Output:      Net:  6188 41Hr.:  5660  04:59  Grand Totals Intake:   Output:  350    Net:  -350 24 Hr.:  200  Urine ml     Out:  350    05:14  Grand Totals Intake:   Output:      Net:   24 Hr.:  200    06:21  Grand Totals Intake:  600 Output:      Net:  600 24 Hr.:  800    Shift 07:00  Grand Totals Intake:  1200 Output:  750    Net:  409 73 Hr.:  800  Urine ml     Out:  750    Daily 07:00  Grand Totals Intake:  1800 Output:  1000    Net:  800 24 Hr.:  800  Urine ml     Out:  1000    07:28  Grand Totals Intake:   Output:  500    Net:  -500 24 Hr.:  -500  Urine ml     Out:  500    Shift 15:00  Grand Totals Intake:   Output:  500    Net:  -500 24 Hr.:  -500  Urine ml     Out:  500   Routine Chem:  17-Feb-16 14:27   Glucose, Serum  197  BUN  42   Creatinine (comp)  3.57  Sodium, Serum 142  Potassium, Serum 4.3  Chloride, Serum  109  CO2, Serum 25  Calcium (Total), Serum  8.3  Anion Gap 8  eGFR (Non-African American)  18 (eGFR values <5m/min/1.73 m2 may be an indication of chronic kidney disease (CKD). Calculated eGFR, using the MRDR Study equation, is useful in  patients with stable renal function. The eGFR calculation will not be reliable in acutely ill patients when serum creatinine is changing rapidly. It is not useful in patients on dialysis. The eGFR calculation may not be applicable to patients at the low and high extremes of body sizes, pregnant women, and vegetarians.)  B-Type Natriuretic Peptide ([facility]  1338 (Result(s) reported on 01 Feb 2015 at 03:24PM.)  18-Feb-16 05:09   Glucose, Serum 90  BUN  39  Creatinine (comp)  3.47  Sodium, Serum 144  Potassium, Serum 4.4  Chloride, Serum  114  CO2, Serum 24  Calcium (Total), Serum  7.9  Anion Gap  6  eGFR (Non-African American)  18 (eGFR values <696mmin/1.73 m2 may be an indication of chronic kidney disease (CKD). Calculated eGFR, using the MRDR Study equation, is useful in  patients with stable renal function. The eGFR calculation will not be reliable in acutely ill patients when serum creatinine is changing rapidly. It is not useful in patients on dialysis. The eGFR calculation may not be applicable to patients at the low and high extremes of body sizes, pregnant women, and vegetarians.)   [place]   18-Feb-16 11:11, [place] Flow Doppler Low Extrem Bilat (Legs)  [place] Flow Doppler Low Extrem Bilat (Legs)   REASON FOR EXAM:    swelling- r/o DCT  COMMENTS:       PROCEDURE: [place]- US[place]OPPLER LOW EXTR BILATERAL  - Feb 02 2015 11:11AM     CLINICAL DATA:  [age]ear old male with a history of swelling    EXAM:  BILATERAL LOWER EXTREMITY VENOUS DOPPLER ULTRASOUND    TECHNIQUE:  Gray-scale sonography with graded compression, as well as color  Doppler and  duplex ultrasound were performed to evaluate the lower  extremity deep venous systems  from the level of the common femoral  vein and including the common femoral, femoral, profunda femoral,  popliteal and calf veins including the posterior tibial, peroneal  and gastrocnemius veins when visible. The superficial great  saphenous vein was also interrogated. Spectral Doppler was utilized  to evaluate flow at rest and with distal augmentation maneuvers in  the common femoral, femoral and popliteal veins.    COMPARISON:  None.    FINDINGS:  RIGHT LOWER EXTREMITY    Common Femoral Vein: No evidence of thrombus. Normal  compressibility, respiratory phasicity and response to augmentation.    Saphenofemoral Junction: No evidence of thrombus. Normal  compressibility and flow on color Doppler imaging.    Profunda Femoral Vein: No evidence of thrombus. Normal  compressibility and flow on color Doppler imaging.    Femoral Vein: No evidence of thrombus. Normal compressibility,  respiratory phasicity and response to augmentation.    Popliteal Vein: No evidence of thrombus. Normal compressibility,  respiratory phasicity and response to augmentation.    Calf Veins: No evidence of thrombus. Normal compressibility and flow  on color Doppler imaging.    Superficial Great Saphenous Vein: No evidence of thrombus. Normal  compressibility and flow on color Doppler imaging.    Other Findings:  None.    LEFT LOWER EXTREMITY    Common Femoral Vein: No evidence of thrombus. Normal  compressibility, respiratory phasicity and response to augmentation.    Saphenofemoral Junction: No evidence of thrombus. Normal  compressibility and flow on color Doppler imaging.    Profunda Femoral Vein: No evidence of thrombus. Normal  compressibility and flow on color Doppler imaging.  Femoral Vein: No evidence of thrombus. Normal compressibility,  respiratory phasicity and response to augmentation.    Popliteal  Vein: No evidence of thrombus. Normal compressibility,  respiratory phasicity and response to augmentation.    Calf Veins: No evidence of thrombus. Normal compressibility and flow  on color Doppler imaging.    Superficial Great Saphenous Vein: No evidence of thrombus. Normal  compressibility and flow on color Doppler imaging.    Other Findings:  None.     IMPRESSION:  Sonographic survey of the bilateral lower extremities negative for  DVT.    Signed,    Dulcy Fanny. Earleen Newport, DO    [facility]    [facility]      Electronically Signed    By: Corrie Mckusick D.O.    On: 02/02/2015 11:22     Verified By: Johny Shears, M.D.,    Impression 74 yo man with HTN, DM, CKD IV with LE edema due to CKD, fluid shifts and possible dietary salt indiscretions. He received 3 doses of lasix PO without response, partly because the dose was very low, but also possibly because the fluid was pooled in his legs. Since being in the hospital, the fluid has mobilized back to his heart and kidneys leading to increased urination. This is likely from elevation of his legs, compression stockings and some IV fluid input. His kidneys likely did experience mild dehydration and thus an acute on chronic picture, although his BL creatinine is actually 3, which may have been very mildly exacerbated by lasix. I believe the acute component is reversible and more likely pre-renal in nature. I don't think he needs any further Lasix at this time, although he may in the future. I do recommend mobilization of fluid with compression stockings and leg elevation. This can be continued at home and has been discussed with the  patient.   Plan - Agree with renal US to make sure no obstruction, given h/o BPH. - Agree wtih ECHO to ensure his heart function is good. - Agree with no Lasix. - Agree with no IVFs. - PO hydration to thirst. - Recommend he use compression stockings at home 10  hours a day, preferably when he is on his feet. - Recommend he try to elevate his legs when he has a chance to sit. This will help reduce nocturia as well. - Recommend low salt diet. - He also asked about queso in his diet. I am not sure his Phos at this time, but Dr Juanito Doom, his outpatient nephrologist, has requested this with his next set of labs. He can provide further recommendations on this at their next appt. - He already has appt with Dr. Juanito Doom at [facility] on [address] on 02/15/15. He should keep that appointment and obtain the requested labwork prior.  All of this was discussed with the patient, his wife and son, with assistance of Spanish interpreter.   Electronic Signatures: Clarice Pole (MD)  (Signed 18-Feb-16 12:36)  Authored: Health Issues, General Aspect/Present Illness, Home Medications, Allergies, History and Physical Exam, Vital Signs, Labs, Radiology, Impression/Plan   Last Updated: 18-Feb-16 12:36 by Clarice Pole (MD)

## 2015-05-27 ENCOUNTER — Emergency Department: Payer: Medicare Other

## 2015-05-27 ENCOUNTER — Emergency Department
Admission: EM | Admit: 2015-05-27 | Discharge: 2015-05-27 | Disposition: A | Discharge status: Home / Self Care | Payer: Medicare Other | Attending: Emergency Medicine | Admitting: Emergency Medicine

## 2015-05-27 DIAGNOSIS — M1712 Unilateral primary osteoarthritis, left knee: Secondary | ICD-10-CM | POA: Diagnosis not present

## 2015-05-27 DIAGNOSIS — M25562 Pain in left knee: Secondary | ICD-10-CM

## 2015-05-27 DIAGNOSIS — Z7982 Long term (current) use of aspirin: Secondary | ICD-10-CM | POA: Diagnosis not present

## 2015-05-27 DIAGNOSIS — M899 Disorder of bone, unspecified: Secondary | ICD-10-CM | POA: Diagnosis not present

## 2015-05-27 DIAGNOSIS — Z79899 Other long term (current) drug therapy: Secondary | ICD-10-CM | POA: Insufficient documentation

## 2015-05-27 MED ORDER — LIDOCAINE HCL (PF) 1 % IJ SOLN: 5.0000 mL | Freq: Once | INTRAMUSCULAR | Status: DC

## 2015-05-27 MED ORDER — OXYCODONE-ACETAMINOPHEN 5-325 MG PO TABS: 1.0000 | ORAL_TABLET | Freq: Four times a day (QID) | ORAL | Status: DC | PRN

## 2015-05-27 MED ORDER — LIDOCAINE HCL (PF) 1 % IJ SOLN
INTRAMUSCULAR | Status: AC
  Filled 2015-05-27: qty 5

## 2015-05-27 NOTE — ED Provider Notes (Signed)
[facility] Emergency Department Provider Note ____________________________________________  Time seen: Approximately 2:51 PM  I have reviewed the triage vital signs and the nursing notes.   HISTORY  Chief Complaint Knee Pain  Historian: Includes patient, wife and 2 granddaughters. Spanish interpreter utilized.  HPI Martin Gilbert is a 74 y.o. male presents to the ER for complaints of left knee pain for 5 days. Patient states that he awoke with left knee pain. Denies fall or injury. Denies any recent increase in ambulation or activity.Reports continues to ambulate but with pain.  States pain to front of his knee at 6/10. Denies pain radiation. Denies pain to left lower leg. States history of similar pain, and states he had fluid removed and a "shot" put in the knee which then resolved the pain.   Denies chest pain, shortness of breath, fevers, abdominal pain, back pain, swelling, or other complaints.    Past Medical History  Diagnosis Date  . CKD (chronic kidney disease), stage IV (Reports not on dialysis)   . HTN (hypertension)   . Diabetes mellitus without complication   . Hyperlipidemia   Gout: states right foot with gout pain 2 weeks ago, now resolved.  Osteoarthritis   Patient Active Problem List   Diagnosis Date Noted  . Pain in the chest 02/15/2015  . Neck pain 02/15/2015  . Diabetes type 2, uncontrolled 02/15/2015  . Chronic kidney disease, stage IV (severe) 02/15/2015  . Frequent headaches 02/15/2015    Past Surgical History  Procedure Laterality Date  . Hernia repair      Current Outpatient Rx  Name  Route  Sig  Dispense  Refill  . acetaminophen (TYLENOL) 500 MG tablet   Oral   Take 500 mg by mouth every 8 (eight) hours as needed.         Marland Kitchen amLODipine (NORVASC) 2.5 MG tablet   Oral   Take 2.5 mg by mouth daily.         Marland Kitchen aspirin 81 MG tablet   Oral   Take 81 mg by mouth daily.         Marland Kitchen atorvastatin (LIPITOR)  20 MG tablet   Oral   Take 20 mg by mouth daily.                      .                          . glipiZIDE (GLUCOTROL XL) 2.5 MG 24 hr tablet   Oral   Take 2.5 mg by mouth daily with breakfast.         . hydrALAZINE (APRESOLINE) 50 MG tablet   Oral   Take 50 mg by mouth 3 (three) times daily.         . metoprolol succinate (TOPROL-XL) 25 MG 24 hr tablet   Oral   Take 25 mg by mouth daily.         . sodium bicarbonate 650 MG tablet   Oral   Take 650 mg by mouth 4 (four) times daily.         . Vitamin D, Cholecalciferol, 1000 UNITS TABS   Oral   Take by mouth.           Allergies Review of patient's allergies indicates no known allergies.  Family History  Problem Relation Age of Onset  . Family history unknown: Yes    Social History History  Substance  Use Topics  . Smoking status: Never Smoker   . Smokeless tobacco: Not on file  . Alcohol Use: No     Comment: Drank in the past, but has stopped for several years.     Review of Systems Constitutional: No fever/chills Eyes: No visual changes. ENT: No sore throat. Cardiovascular: Denies chest pain. Respiratory: Denies shortness of breath. Gastrointestinal: No abdominal pain.  No nausea, no vomiting.  No diarrhea.  No constipation. Genitourinary: Negative for dysuria. Musculoskeletal: Negative for back pain.positive for left knee pain Skin: Negative for rash. Neurological: Negative for headaches, focal weakness or numbness.  10-point ROS otherwise negative.  ____________________________________________   PHYSICAL EXAM:  VITAL SIGNS: ED Triage Vitals  Enc Vitals Group     BP 05/27/15 1354 128/46 mmHg     Pulse Rate 05/27/15 1354 69     Resp 05/27/15 1354 18     Temp 05/27/15 1354 99 F (37.2 C)     Temp Source 05/27/15 1354 Oral     SpO2 05/27/15 1354 96 %     Weight 05/27/15 1354 183 lb (83.008 kg)     Height 05/27/15 1354 5\' 9"  (1.753 m)     Head Cir --      Peak Flow --       Pain Score 05/27/15 1355 10     Pain Loc --      Pain Edu? --      Excl. in [place]? --     Constitutional: Alert and oriented. Well appearing and in no acute distress. Eyes: Conjunctivae are normal. PERRL. EOMI. Head: Atraumatic. Nose: No congestion/rhinnorhea. Mouth/Throat: Mucous membranes are moist.   Neck: No stridor.  No cervical spine tenderness to palpation. Hematological/Lymphatic/Immunilogical: No cervical lymphadenopathy. Cardiovascular: Normal rate, regular rhythm. Grossly normal heart sounds.  Good peripheral circulation. Respiratory: Normal respiratory effort.  No retractions. Lungs CTAB. Gastrointestinal: Soft and nontender. No distention. No CVA tenderness. Musculoskeletal: Upper extremities nontender.  Left anterior knee mild to mod TTP, with painful flexion and extension. Full ROM present. Mild swelling and fluid noted to superior and inferior left patella. No erythema, skin intact. Distal pedal pulses equal bilaterally and easily found.  Neurologic:  Normal speech and language. No gross focal neurologic deficits are appreciated. Speech is normal. No gait instability. Skin:  Skin is warm, dry and intact. No rash noted. Psychiatric: Mood and affect are normal. Speech and behavior are normal.  _________________________________________  RADIOLOGY   ____________________________________________   PROCEDURES  Procedure(s) performed:  Pt refuse left knee arthrocentesis. Procedure, risks and benefits explained.  ____________________________________________   INITIAL IMPRESSION / ASSESSMENT AND PLAN / ED COURSE  Pertinent labs & imaging results that were available during my care of the patient were reviewed by me and considered in my medical decision making (see chart for details).  1510: Pt initially verbalized wanting left knee arthrocentesis in ER. However, pt then reports he does NOT want procedure done in ER. Patient reports he will follow up with his primary care  physician or orthopedic for procedure.   Well appearing. Complains of left knee pain, nontraumatic. History of similar which resolved with joint injection per patient. No signs of infection. Full ROM. Will evaluate by xray and ultrasound. Suspect osteoarthritis. Awaiting results.   1545: Care transferred to [facility], [place]. Arvella Nigh to resume care.  ____________________________________________   FINAL CLINICAL IMPRESSION(S) / ED DIAGNOSES      Left Knee pain, acute     Marylene Land, NP 05/28/15 [place],  MD 05/28/15 2318

## 2015-05-27 NOTE — ED Notes (Signed)
Left knee pain X 5 days, denies injury. Awoke with pain x 5 days ago per patient report. Pain to front of left knee, worse with walking and weight bearing, color WNL.

## 2015-05-27 NOTE — Discharge Instructions (Signed)
Artritis inespecfica (Arthritis, Nonspecific) La artritis es la inflamacin de una articulacin. Los sntomas son dolor, enrojecimiento, calor o hinchazn. Pueden verse involucradas una o ms articulaciones. Hay diferentes tipos de artritis. El mdico no podr Lobbyist cul es el tipo de artritis que usted sufre.  CAUSAS La causa ms frecuente es el desgaste de la articulacin (osteoartritis). Esto ocasiona lesiones en el cartlago, que puede romperse con [place]. Las zonas ms afectadas por este tipo de artritis son las rodillas, caderas, espalda y cuello. Otros tipos de artritis y causas frecuentes de dolor en la articulacin son:  Esguinces y otras lesiones cercanas a la articulacin}. En algunos casos, esguinces y lesiones menores causan dolor e hinchazn que aparece horas ms tarde.  Artritis reumatoidea [employer], pies y rodillas. Generalmente afecta ambos lados del cuerpo al [employer]. Generalmente se asocia a enfermedades crnicas, fiebre, prdida de peso y debilidad general.  Artritis por cristales. La gota y la pseudogota pueden causar dolor intenso agudo ocasional, enrojecimiento e hinchazn del pie, el tobillo o la rodilla.  Artritis infecciosa. Las bacterias pueden penetrar en la articulacin a travs de una herida en la piel. Esto puede causar una infeccin en la articulacin. Las bacterias y virus tambin pueden diseminarse a travs del torrente sanguneo y Print production planner las articulaciones.  Reacciones a medicamentos, infecciosas y Camera operator. En algunos casos las articulaciones duelen levemente y estn ligeramente hinchadas en este tipo de enfermedad. SNTOMAS  El dolor es el sntoma principal.  La articulacin tambin pueden verse roja, hinchada y caliente al tacto.  En ciertos tipos de artritis hay fiebre o malestar general.  En la articulacin que presenta artritis sentir dolor con el movimiento. En otros tipos de artritis hay  rigidez. DIAGNSTICO: El mdico sospechar artritis basndose en la descripcin de los sntomas y en el examen. Ser necesario realizar pruebas para diagnosticar el tipo de artritis.  Anlisis de [place] y en algunos casos de [place].  Radiografas y en algunos casos tomografa computada o diagnstico por imgenes.  La remocin del lquido de Water engineer (artrocentesis) se realiza para Aeronautical engineer presencia de bacterias, cristales o por otras causas. Su mdico (o un especialista) adormecern la zona de la articulacin con un anestsico local y utilizarn una aguja para retirar lquido de la articulacin para ser examinado. Este procedimiento es slo mnimamente molesto.  An con estas pruebas, el mdico no podr decir qu tipo de artritis usted sufre. La consulta con un especialista (reumatlogo) puede ser de [place]. TRATAMIENTO El mdico comentar con usted el tratamiento especfico para su tipo de artritis. Si el tipo especfico no puede determinarse, podrn aplicarse las siguientes recomendaciones generales.  El tratamiento para el dolor intenso de las articulaciones consiste en:  Hacer reposo  Elevar el [place].  Podrn prescribirle medicamentos antiinflamatorios (como ibuprofeno). Evite las actividades que aumenten Conservation officer, historic buildings.  Slo tome medicamentos de venta libre o prescriptos para Glass blower/designer y las [place], segn las indicaciones de su mdico.  Puede aplicarse compresas fras sobre la articulacin dolorida durante 10 a 15 minutos cada hora. Las compresas calientes tambin pueden ser beneficiosas, pero no las utilice durante la noche. No use compresas calientes sin autorizacin de su mdico, si es diabtico.  Una inyeccin de corticoides en la articulacin artrtica puede ayudar a reducir el dolor y la hinchazn.  Si una artritis aguda [employer] siguientes 1  2 [place], ser necesario descartar una infeccin. El tratamiento prolongado implica la modificacin de  [place] y del estilo  de vida para reducir Pension scheme manager. Puede ser necesario que baje de [place]. La actividad fsica es necesaria para nutrir Charity fundraiser de la articulacin y Tax adviser. Esto ayuda a Theatre manager fuertes los msculos que rodean Water engineer. INSTRUCCIONES PARA EL CUIDADO DOMICILIARIO  No tome aspirina para Best boy si se sospecha que sufre gota. Esto eleva los niveles de cido rico.  Solo tome medicamentos que se pueden comprar sin receta o recetados para Conservation officer, historic buildings, Tree surgeon o fiebre, como le indica el mdico.  [place] reposo todo el tiempo que pueda.  Si la articulacin est hinchada, mantngala elevada.  Utilice muletas si la articulacin que le duele est en la pierna.  Beber abundante cantidad de lquidos ser beneficioso para ciertos tipos de artritis.  [place] fsica regular puede ser beneficiosa, incluyendo las actividades de bajo impacto como:  [place].  [place].  Andar en bicicleta.  Caminar.  La rigidez matutina se Charleen Kirks con una ducha caliente.  Tambin es beneficioso que realice ejercicios de amplitud de movimiento. SOLICITE ATENCIN MDICA SI:  No se siente mejor o empeora luego de las 24 horas.  Presenta efectos adversos por los medicamentos y no mejora con Dispensing optician. SOLICITE ATENCIN MDICA INMEDIATAMENTE SI:  Tiene fiebre.  Presenta fiebre o dolor intenso, hinchazn o enrojecimiento.  Muchas articulaciones estn involucradas y estn hinchadas y Tree surgeon.  Tiene un dolor intenso en la espalda o siente debilidad en las piernas.  Pierde el control de la vejiga o del intestino. Document Released: 12/02/2005 Document Revised: 02/24/2012 [facility] Patient Information 2015 [place]. This information is not intended to replace advice given to you by your health care provider. Make sure you discuss any questions you have with your health care  provider.

## 2015-05-27 NOTE — ED Provider Notes (Signed)
  Physical Exam  BP 128/46 mmHg  Pulse 69  Temp(Src) 99 F (37.2 C) (Oral)  Resp 18  Ht 5\' 9"  (1.753 m)  Wt 183 lb (83.008 kg)  BMI 27.01 kg/m2  SpO2 96%  Physical Exam  Constitutional: He is oriented to person, place, and time. He appears well-developed and well-nourished.  HENT:  Head: Normocephalic and atraumatic.  Neck: Normal range of motion. Neck supple.  Cardiovascular: Normal rate and regular rhythm.   Pulmonary/Chest: Effort normal. No respiratory distress.  Musculoskeletal:  Left knee with mild swelling and effusion. Range of motion normal but with mild discomfort with hyperflexion. Mild tenderness to palpation over the anterior knee along the patella. Knee is stable to valgus and varus stress testing. Patient's neuro rest intact left lower extremity  Neurological: He is alert and oriented to person, place, and time.  Skin: Skin is warm and dry.  Psychiatric: He has a normal mood and affect. His behavior is normal. Thought content normal.      ED Course  Procedures Imaging:  Ultrasound left lower extremity: No evidence of DVT  X-ray: 4 views of the left knee showed no evidence of acute bony abnormality. There is arthritic changes present. There is an osseous lesion in the distal portion of the femur. Radiologist recommended nonemergent MRI of the distal femur in the near future.   MDM 1. Patient with nontraumatic left knee pain. History of knee osteoarthritis that has been present in the past, relieved with intra-articular cortisone injection. Patient was offered aspiration of the knee but refused. Plain films of the left knee did show a lesion in the distal femur that will need further workup. This was explained to the patient and family. They will follow-up with [facility] in 2 days. Patient was given a walker to help with ambulation today. He is also given Percocet 5-3 25, one to 2 tabs by mouth every 6 hours as needed for pain quantity #25 was 0  refills.      Duanne Guess, PA-C 05/27/15 1655  Hinda Kehr, MD 05/28/15 [phone]

## 2015-06-05 ENCOUNTER — Other Ambulatory Visit: Payer: Self-pay | Admitting: Surgery

## 2015-06-05 DIAGNOSIS — M170 Bilateral primary osteoarthritis of knee: Secondary | ICD-10-CM

## 2015-06-05 DIAGNOSIS — M899 Disorder of bone, unspecified: Secondary | ICD-10-CM

## 2015-06-13 ENCOUNTER — Other Ambulatory Visit: Payer: Self-pay | Admitting: Surgery

## 2015-06-13 ENCOUNTER — Ambulatory Visit
Admission: RE | Admit: 2015-06-13 | Discharge: 2015-06-13 | Disposition: A | Discharge status: Home / Self Care | Payer: Medicare Other | Source: Ambulatory Visit | Attending: Surgery | Admitting: Surgery

## 2015-06-13 DIAGNOSIS — M899 Disorder of bone, unspecified: Secondary | ICD-10-CM

## 2015-06-13 DIAGNOSIS — M170 Bilateral primary osteoarthritis of knee: Secondary | ICD-10-CM | POA: Diagnosis present

## 2015-07-27 ENCOUNTER — Emergency Department
Admission: EM | Admit: 2015-07-27 | Discharge: 2015-07-27 | Disposition: A | Discharge status: Home / Self Care | Payer: Medicare Other | Attending: Emergency Medicine | Admitting: Emergency Medicine

## 2015-07-27 DIAGNOSIS — Z79899 Other long term (current) drug therapy: Secondary | ICD-10-CM | POA: Diagnosis not present

## 2015-07-27 DIAGNOSIS — I129 Hypertensive chronic kidney disease with stage 1 through stage 4 chronic kidney disease, or unspecified chronic kidney disease: Secondary | ICD-10-CM | POA: Insufficient documentation

## 2015-07-27 DIAGNOSIS — R799 Abnormal finding of blood chemistry, unspecified: Secondary | ICD-10-CM | POA: Insufficient documentation

## 2015-07-27 DIAGNOSIS — Z792 Long term (current) use of antibiotics: Secondary | ICD-10-CM | POA: Diagnosis not present

## 2015-07-27 DIAGNOSIS — N184 Chronic kidney disease, stage 4 (severe): Secondary | ICD-10-CM | POA: Insufficient documentation

## 2015-07-27 DIAGNOSIS — Z7982 Long term (current) use of aspirin: Secondary | ICD-10-CM | POA: Insufficient documentation

## 2015-07-27 DIAGNOSIS — R899 Unspecified abnormal finding in specimens from other organs, systems and tissues: Secondary | ICD-10-CM

## 2015-07-27 LAB — COMPREHENSIVE METABOLIC PANEL
ALBUMIN: 3 g/dL — AB (ref 3.5–5.0)
ALT: 15 U/L — ABNORMAL LOW (ref 17–63)
ANION GAP: 8 (ref 5–15)
AST: 19 U/L (ref 15–41)
Alkaline Phosphatase: 117 U/L (ref 38–126)
BILIRUBIN TOTAL: 0.4 mg/dL (ref 0.3–1.2)
BUN: 62 mg/dL — ABNORMAL HIGH (ref 6–20)
CO2: 17 mmol/L — ABNORMAL LOW (ref 22–32)
CREATININE: 4.8 mg/dL — AB (ref 0.61–1.24)
Calcium: 8.3 mg/dL — ABNORMAL LOW (ref 8.9–10.3)
Chloride: 112 mmol/L — ABNORMAL HIGH (ref 101–111)
GFR calc Af Amer: 12 mL/min — ABNORMAL LOW (ref >60)
GFR calc non Af Amer: 11 mL/min — ABNORMAL LOW (ref >60)
GLUCOSE: 79 mg/dL (ref 65–99)
POTASSIUM: 4.7 mmol/L (ref 3.5–5.1)
Sodium: 137 mmol/L (ref 135–145)
Total Protein: 6.6 g/dL (ref 6.5–8.1)

## 2015-07-27 LAB — CBC
HEMATOCRIT: 30 % — AB (ref 40.0–52.0)
Hemoglobin: 9.8 g/dL — ABNORMAL LOW (ref 13.0–18.0)
MCH: 29.2 pg (ref 26.0–34.0)
MCHC: 32.7 g/dL (ref 32.0–36.0)
MCV: 89.4 fL (ref 80.0–100.0)
Platelets: 265 10*3/uL (ref 150–440)
RBC: 3.36 MIL/uL — ABNORMAL LOW (ref 4.40–5.90)
RDW: 13.6 % (ref 11.5–14.5)
WBC: 9.2 10*3/uL (ref 3.8–10.6)

## 2015-07-27 NOTE — ED Provider Notes (Signed)
[facility] Emergency Department Provider Note    ____________________________________________  Time seen: 1640  I have reviewed the triage vital signs and the nursing notes.   HISTORY  Chief Complaint Abnormal Lab   History limited by: [place] utilized   HPI Martin Gilbert is a 74 y.o. male who presented to the emergency department today at the request of his kidney doctor because of concerns for abnormal labs. He had laboratory work done 2 days ago which showed a decreased bicarbonate and elevated potassium. He does have a history of chronic kidney disease. Patient does admit to not taking his medications for the past 2 weeks. He states he was feeling good so that he didn't need them. He does state that he took his medications after his appointment with his doctor and his blood draw. Denies any chest pain or shortness breath. Denies any weakness.     Past Medical History  Diagnosis Date  . CKD (chronic kidney disease), stage IV   . HTN (hypertension)   . Diabetes mellitus without complication   . Hyperlipidemia     Patient Active Problem List   Diagnosis Date Noted  . Pain in the chest 02/15/2015  . Neck pain 02/15/2015  . Diabetes type 2, uncontrolled 02/15/2015  . Chronic kidney disease, stage IV (severe) 02/15/2015  . Frequent headaches 02/15/2015    Past Surgical History  Procedure Laterality Date  . Hernia repair      Current Outpatient Rx  Name  Route  Sig  Dispense  Refill  . acetaminophen (TYLENOL) 500 MG tablet   Oral   Take 500 mg by mouth every 8 (eight) hours as needed.         Marland Kitchen amLODipine (NORVASC) 2.5 MG tablet   Oral   Take 2.5 mg by mouth daily.         Marland Kitchen aspirin 81 MG tablet   Oral   Take 81 mg by mouth daily.         Marland Kitchen atorvastatin (LIPITOR) 20 MG tablet   Oral   Take 20 mg by mouth daily.         . ciprofloxacin (CIPRO) 250 MG tablet   Oral   Take 250 mg by  mouth 2 (two) times daily.         . cyclobenzaprine (FLEXERIL) 10 MG tablet   Oral   Take 10 mg by mouth 3 (three) times daily as needed for muscle spasms.         Marland Kitchen doxazosin (CARDURA) 2 MG tablet   Oral   Take 2 mg by mouth daily.         Marland Kitchen glipiZIDE (GLUCOTROL XL) 2.5 MG 24 hr tablet   Oral   Take 2.5 mg by mouth daily with breakfast.         . hydrALAZINE (APRESOLINE) 50 MG tablet   Oral   Take 50 mg by mouth 3 (three) times daily.         . metoprolol succinate (TOPROL-XL) 25 MG 24 hr tablet   Oral   Take 25 mg by mouth daily.         Marland Kitchen oxyCODONE-acetaminophen (ROXICET) 5-325 MG per tablet   Oral   Take 1-2 tablets by mouth every 6 (six) hours as needed for severe pain.   25 tablet   0   . sodium bicarbonate 650 MG tablet   Oral   Take 650 mg by mouth 4 (four) times  daily.         . Vitamin D, Cholecalciferol, 1000 UNITS TABS   Oral   Take by mouth.           Allergies Review of patient's allergies indicates no known allergies.  Family History  Problem Relation Age of Onset  . Family history unknown: Yes    Social History Social History  Substance Use Topics  . Smoking status: Never Smoker   . Smokeless tobacco: None  . Alcohol Use: No     Comment: Drank in the past, but has stopped for several years.     Review of Systems  Constitutional: Negative for fever. Cardiovascular: Negative for chest pain. Respiratory: Negative for shortness of breath. Gastrointestinal: Negative for abdominal pain, vomiting and diarrhea. Genitourinary: Negative for dysuria. Musculoskeletal: Negative for back pain. Skin: Negative for rash. Neurological: Negative for headaches, focal weakness or numbness.  10-point ROS otherwise negative.  ____________________________________________   PHYSICAL EXAM:  VITAL SIGNS: ED Triage Vitals  Enc Vitals Group     BP 07/27/15 1501 114/59 mmHg     Pulse Rate 07/27/15 1501 74     Resp 07/27/15 1501 18      Temp 07/27/15 1501 98.4 F (36.9 C)     Temp Source 07/27/15 1501 Oral     SpO2 07/27/15 1501 96 %     Weight 07/27/15 1501 160 lb (72.576 kg)     Height 07/27/15 1501 5\' 6"  (1.676 m)   Constitutional: Alert and oriented. Well appearing and in no distress. Eyes: Conjunctivae are normal. PERRL. Normal extraocular movements. ENT   Head: Normocephalic and atraumatic.   Nose: No congestion/rhinnorhea.   Mouth/Throat: Mucous membranes are moist.   Neck: No stridor. Hematological/Lymphatic/Immunilogical: No cervical lymphadenopathy. Cardiovascular: Normal rate, regular rhythm.  No murmurs, rubs, or gallops. Respiratory: Normal respiratory effort without tachypnea nor retractions. Breath sounds are clear and equal bilaterally. No wheezes/rales/rhonchi. Gastrointestinal: Soft and nontender. No distention.  Genitourinary: Deferred Musculoskeletal: Normal range of motion in all extremities. No joint effusions.  No lower extremity tenderness nor edema. Neurologic:  Normal speech and language. No gross focal neurologic deficits are appreciated. Speech is normal.  Skin:  Skin is warm, dry and intact. No rash noted. Psychiatric: Mood and affect are normal. Speech and behavior are normal. Patient exhibits appropriate insight and judgment.  ____________________________________________    LABS (pertinent positives/negatives)  Labs Reviewed  COMPREHENSIVE METABOLIC PANEL - Abnormal; Notable for the following:    Chloride 112 (*)    CO2 17 (*)    BUN 62 (*)    Creatinine, Ser 4.80 (*)    Calcium 8.3 (*)    Albumin 3.0 (*)    ALT 15 (*)    GFR calc non Af Amer 11 (*)    GFR calc Af Amer 12 (*)    All other components within normal limits  CBC - Abnormal; Notable for the following:    RBC 3.36 (*)    Hemoglobin 9.8 (*)    HCT 30.0 (*)    All other components within normal limits  BLOOD GAS, VENOUS - Abnormal; Notable for the following:    pCO2, Ven 33 (*)    Bicarbonate 17.0  (*)    Acid-base deficit 8.1 (*)    All other components within normal limits     ____________________________________________   EKG  None  ____________________________________________    RADIOLOGY  None  ____________________________________________   PROCEDURES  Procedure(s) performed: None  Critical Care performed: No  ____________________________________________   INITIAL IMPRESSION / ASSESSMENT AND PLAN / ED COURSE  Pertinent labs & imaging results that were available during my care of the patient were reviewed by me and considered in my medical decision making (see chart for details).  Patient presents to the emergency department today because of concerns for abnormal labs. Lab work today is much improved over the outpatient lab. I think likely this is an large part due to the patient restarting his medications. Did instruct patient to take double his bicarbonate for the next couple of days. He will have follow-up appointment on Monday with his nephrologist.  ____________________________________________   FINAL CLINICAL IMPRESSION(S) / ED DIAGNOSES  Final diagnoses:  Abnormal laboratory test     Nance Pear, MD 07/27/15 1708

## 2015-07-27 NOTE — Discharge Instructions (Signed)
Please seek medical attention for any high fevers, chest pain, shortness of breath, change in behavior, persistent vomiting, bloody stool or any other new or concerning symptoms.  Enfermedad renal crnica. (Chronic Kidney Disease) La enfermedad renal crnica se produce cuando los riones sufren un dao durante un largo perodo. Los riones son dos rganos que se encuentran a cada lado de la columna vertebral entre el centro de la espalda y la parte anterior del abdomen. Los riones:   [employer] y el exceso de agua de la [place].  Producen hormonas importantes. Ayudan a [employer], a regular la presin arterial y a formar glbulos rojos.  Regulan los lquidos y los productos qumicos en la sangre y los tejidos. Un dao pequeo puede no causar problemas, pero un dao importante puede impedir que los riones funcionen de la [place]. Si no se toman medidas para frenar el dao renal o evitar que empeore, los riones pueden dejar de funcionar de forma [place]. La mayora de las veces, la enfermedad renal crnica no se cura. Sin embargo, muchas veces se puede Chief Technology Officer, y los que la sufren, por lo general pueden llevar una vida normal. CAUSAS  Las causas ms comunes de la enfermedad renal crnica son la diabetes y la presin arterial alta (hipertensin). Otras causas de enfermedad renal crnica pueden ser:   [employer] filtros del rin.  Enfermedades que afectan el sistema inmunolgico.  Enfermedades genticas.  Medicamentos que daan los riones, como los antiinflamatorios no esteroides [place]).  Envenenamiento o exposicin a sustancias txicas.  Una infeccin urinaria o renal recurrente.  Problemas en el flujo de orina. Las causas pueden ser:  Hotel manager.  Clculos en el rin.  La prstata agrandada en los hombres. SIGNOS Y SNTOMAS  Debido a que el dao renal en la enfermedad renal crnica se produce gradualmente, los sntomas se  desarrollan lentamente y pueden no ser evidentes hasta que el dao renal es grave. Una persona puede tener una enfermedad renal durante aos sin mostrar ningn sntoma. Los sntomas pueden ser:   Hinchazn (edema) de las piernas, tobillos o pies.  Cansancio (letargo).  Nuseas o vmitos.  Confusin.  Problemas en la miccin, tales como:  Disminucin de la produccin de [place].  Aumento de los deseos de orinar, especialmente por la noche.  Accidentes frecuentes en los nios que estn entrenando el control de esfnteres.  Contracciones o calambres musculares.  Falta de aire.  Debilidad.  Picazn persistente.  Prdida del apetito.  Gusto metlico en la boca  Dificultad para dormir.  Movimientos lentos en los nios.  [place] [employer] DIAGNSTICO  La enfermedad renal crnica se Emergency planning/management officer y diagnosticar mediante estudios que incluyen anlisis de [place], de [place], diagnsticos por imgenes, o una biopsia de rin.  TRATAMIENTO  La mayora de las enfermedades renales crnicas no pueden curarse. El tratamiento generalmente incluye el alivio de los sntomas y prevenir o Lexicographer la progresin de la enfermedad. El tratamiento puede incluir:   Una dieta especial. Es posible que tenga que evitar el alcohol y los alimentos con alto contenido de sal y Field seismologist.  Medicamentos. Pueden ser:  Controlar la presin arterial.  Mejorar la anemia.  Mejorar la hinchazn.  Proteger los [employer]. INSTRUCCIONES PARA EL CUIDADO EN EL HOGAR   Siga la dieta que le han indicado.  Tome los medicamentos solamente como se lo haya indicado el mdico. No utilice ningn medicamento nuevo (recetado, de venta libre o suplementos nutricionales) excepto si el  mdico lo aprueba. Muchos medicamentos pueden empeorar la enfermedad renal, o ser necesario ajustar las dosis.  Si fuma, deje de hacerlo. Converse con su mdico acerca de los programas para dejar de fumar.  Concurra a todas las  visitas de control como se lo haya indicado el mdico. SOLICITE ATENCIN MDICA DE INMEDIATO SI:  Empeora o desarrolla nuevos sntomas.  Aparecen sntomas de enfermedad renal en etapa terminal. Estos incluyen:  Dolores de cabeza.  La piel se oscurece o se aclara de manera anormal.  Entumecimiento en las manos o en los pies.  Aparecen hematomas con facilidad.  Hipo frecuente.  Se detiene la menstruacin.  Tiene fiebre.  Disminuye la produccin de [place].  Siente dolor o tiene sangrado al [employer]. ASEGRESE DE QUE:  Comprende estas instrucciones.  Controlar su afeccin.  Recibir ayuda de inmediato si no mejora o si empeora. PARA OBTENER MS INFORMACIN   Asociacin Americana de Pacientes Renales (American Association of Kidney Patients, AAKP): BombTimer.gl  [place] ([place]): www.kidney.org  Fondo Americano para Problemas Renales ([place]): https://mathis.com/  Programa de rehabilitacin de Life Options: www.lifeoptions.org y www.kidneyschool.org Document Released: 02/28/2009 Document Revised: 04/18/2014 [facility] Patient Information 2015 [place], [place]. This information is not intended to replace advice given to you by your health care provider. Make sure you discuss any questions you have with your health care provider.

## 2015-07-27 NOTE — ED Notes (Signed)
With interpretor in room pt states he stopped taking his meds x 14 days while in [place] - came back 2 days ago and restarted meds yesterday. Family did not realize he had stopped his meds.

## 2015-07-27 NOTE — ED Notes (Signed)
Pt sent in by pcp for abnormal lab results. Labs drawn from triage prior to pt coming into room 7.  vbg drawn as ordered.

## 2015-07-27 NOTE — ED Notes (Addendum)
Pt here for elevated BUN/Creat from MD at [facility].the patient c/o LOSS OF appetite and having issues with renal disease..has not started any dialysis.Martin Gilbert

## 2015-07-28 LAB — BLOOD GAS, VENOUS
ACID-BASE DEFICIT: 8.1 mmol/L — AB (ref 0.0–2.0)
Bicarbonate: 17 mEq/L — ABNORMAL LOW (ref 21.0–28.0)
PATIENT TEMPERATURE: 37
pCO2, Ven: 33 mmHg — ABNORMAL LOW (ref 44.0–60.0)
pH, Ven: 7.32 (ref 7.320–7.430)

## 2015-07-31 DIAGNOSIS — M1A3710 Chronic gout due to renal impairment, right ankle and foot, without tophus (tophi): Secondary | ICD-10-CM | POA: Insufficient documentation

## 2015-11-28 ENCOUNTER — Encounter: Payer: Self-pay | Admitting: Cardiovascular Disease

## 2015-11-28 ENCOUNTER — Ambulatory Visit (INDEPENDENT_AMBULATORY_CARE_PROVIDER_SITE_OTHER): Payer: Medicare Other | Admitting: Cardiovascular Disease

## 2015-11-28 VITALS — BP 125/60 | HR 60 | Ht 67.0 in | Wt 189.1 lb

## 2015-11-28 DIAGNOSIS — R519 Headache, unspecified: Secondary | ICD-10-CM

## 2015-11-28 DIAGNOSIS — N184 Chronic kidney disease, stage 4 (severe): Secondary | ICD-10-CM | POA: Diagnosis not present

## 2015-11-28 DIAGNOSIS — I10 Essential (primary) hypertension: Secondary | ICD-10-CM | POA: Diagnosis not present

## 2015-11-28 DIAGNOSIS — E1165 Type 2 diabetes mellitus with hyperglycemia: Secondary | ICD-10-CM

## 2015-11-28 DIAGNOSIS — E1159 Type 2 diabetes mellitus with other circulatory complications: Secondary | ICD-10-CM

## 2015-11-28 DIAGNOSIS — R51 Headache: Secondary | ICD-10-CM

## 2015-11-28 DIAGNOSIS — R079 Chest pain, unspecified: Secondary | ICD-10-CM

## 2015-11-28 DIAGNOSIS — IMO0002 Reserved for concepts with insufficient information to code with codable children: Secondary | ICD-10-CM

## 2015-11-28 NOTE — Assessment & Plan Note (Signed)
Headaches have resolved, no further workup needed, previously felt to be musculoskeletal

## 2015-11-28 NOTE — Assessment & Plan Note (Addendum)
Long times been going through each one of his medications. He is not taking amlodipine or metoprolol  blood pressure elevated on arrival at [id number] systolic down to 0000000 on recheck by myself Encouraged him to hold the amlodipine and metoprolol, stay on hydralazine twice a day and Cardura

## 2015-11-28 NOTE — Progress Notes (Signed)
Patient ID: Martin Gilbert, male    DOB: June 23, 1941, 74 y.o.   MRN: [id number]  HPI Comments: Martin Gilbert is a 74 year old gentleman with poorly controlled diabetes, chronic renal failure creatinine greater than 4, hypertension, previously presenting to [facility] 02/07/2015 with chest pain, neck pain, headaches. He presents for routine follow-up of his chest pain He has been in the hospital 3 times in February most recently February 26 with discharge 02/12/2015 with headache He had a stress test in March 2016 that showed no ischemia  In follow-up today, he reports that he has leg swelling He has not been taking Lasix for several days Intact on review of his medications he has not been taking amlodipine, metoprolol He takes hydralazine 50 mg twice a day, Cardura and his diabetes pill He reports that he did not feel well on metoprolol and amlodipine  EKG on today's visit shows normal sinus rhythm with rate 60 bpm, no significant ST or T-wave changes Eats lots of tortillas Which may contribute to his high sugars Recent flareup of his gout, better now Prednisone did push his sugars higher He does take iron daily. Unclear why he has not been taking his Lasix Headaches seem to have improved, no more chest pain symptoms  Other past medical history In the hospital February 17 to 02/03/2015 for acute on chronic kidney disease, hypertension, lower extremity swelling. Echocardiogram at that time showed normal ejection fraction Primary care had been treating leg edema with Lasix. Lower extremity Doppler ruled out DVT. Lasix likely contributed to worsening renal failure. Seen by renal, started on fluids, Lasix was held. Renal function improved to baseline, creatinine 3.4. Ultrasound showed chronic medical renal disease He was discharged with hydralazine.  Most recent hospital admission he had VQ scan and ruled out for PE. D-dimer was mildly elevated He presents today for discussion of stress  testing. He continues to have neck pain and headaches. Occasional chest pain. CT scan of the head in the hospital showed no abnormalities He was felt his headache and neck pain were possibly from arthritis or musculoskeletal etiology. The symptoms have been ongoing for several months  hes not very active at baseline secondary to his neck and headaches.     Allergies  Allergen Reactions  . Acetaminophen Hives    Possible lip swelling.  Patient is not sure  . Aspirin Rash    Outpatient Encounter Prescriptions as of 11/28/2015  Medication Sig  . acetaminophen (TYLENOL) 500 MG tablet Take 500 mg by mouth every 8 (eight) hours as needed.  Marland Kitchen allopurinol (ZYLOPRIM) 100 MG tablet Take 100 mg by mouth daily.  Marland Kitchen aspirin 81 MG tablet Take 81 mg by mouth daily.  Marland Kitchen atorvastatin (LIPITOR) 20 MG tablet Take 20 mg by mouth daily.  Marland Kitchen doxazosin (CARDURA) 2 MG tablet Take 2 mg by mouth daily.  . ferrous sulfate 325 (65 FE) MG tablet Take 325 mg by mouth daily with breakfast.  . furosemide (LASIX) 40 MG tablet Take 40 mg by mouth daily.  Marland Kitchen glipiZIDE (GLUCOTROL XL) 2.5 MG 24 hr tablet Take 2.5 mg by mouth daily with breakfast.  . hydrALAZINE (APRESOLINE) 50 MG tablet Take 50 mg by mouth 3 (three) times daily.  . Multiple Vitamin (MULTI-VITAMINS) TABS Take 1 tablet by mouth daily.  . sodium bicarbonate 650 MG tablet Take 650 mg by mouth 4 (four) times daily.  . Vitamin D, Cholecalciferol, 1000 UNITS TABS Take by mouth.  . [DISCONTINUED] amLODipine (NORVASC) 2.5 MG tablet Take  2.5 mg by mouth daily.  . [DISCONTINUED] metoprolol succinate (TOPROL-XL) 25 MG 24 hr tablet Take 25 mg by mouth daily.  . [DISCONTINUED] ciprofloxacin (CIPRO) 250 MG tablet Take 250 mg by mouth 2 (two) times daily.  . [DISCONTINUED] cyclobenzaprine (FLEXERIL) 10 MG tablet Take 10 mg by mouth 3 (three) times daily as needed for muscle spasms.  . [DISCONTINUED] oxyCODONE-acetaminophen (ROXICET) 5-325 MG per tablet Take 1-2 tablets  by mouth every 6 (six) hours as needed for severe pain.   No facility-administered encounter medications on file as of 11/28/2015.    Past Medical History  Diagnosis Date  . CKD (chronic kidney disease), stage IV ([place])   . HTN (hypertension)   . Diabetes mellitus without complication ([place])   . Hyperlipidemia     Past Surgical History  Procedure Laterality Date  . Hernia repair      Social History  reports that he has never smoked. He does not have any smokeless tobacco history on file. He reports that he does not drink alcohol or use illicit drugs.  Family History Family history is unknown by patient.    Review of Systems  Constitutional: Negative.   Respiratory: Negative.   Cardiovascular: Negative.   Gastrointestinal: Negative.   Musculoskeletal: Positive for arthralgias.  Neurological: Negative.   Hematological: Negative.   Psychiatric/Behavioral: Negative.   All other systems reviewed and are negative.   BP 125/60 mmHg  Pulse 60  Ht 5\' 7"  (1.702 m)  Wt 189 lb 1.9 oz (85.784 kg)  BMI 29.61 kg/m2  Physical Exam  Constitutional: He is oriented to person, place, and time. He appears well-developed and well-nourished.  HENT:  Head: Normocephalic.  Nose: Nose normal.  Mouth/Throat: Oropharynx is clear and moist.  Eyes: Conjunctivae are normal. Pupils are equal, round, and reactive to light.  Neck: Normal range of motion. Neck supple. No JVD present.  Cardiovascular: Normal rate, regular rhythm, S1 normal, S2 normal, normal heart sounds and intact distal pulses.  Exam reveals no gallop and no friction rub.   No murmur heard. Pulmonary/Chest: Effort normal and breath sounds normal. No respiratory distress. He has no wheezes. He has no rales. He exhibits no tenderness.  Abdominal: Soft. Bowel sounds are normal. He exhibits no distension. There is no tenderness.  Musculoskeletal: Normal range of motion. He exhibits no edema or tenderness.  Lymphadenopathy:    He  has no cervical adenopathy.  Neurological: He is alert and oriented to person, place, and time. Coordination normal.  Skin: Skin is warm and dry. No rash noted. No erythema.  Psychiatric: He has a normal mood and affect. His behavior is normal. Judgment and thought content normal.      Assessment and Plan   Nursing note and vitals reviewed.

## 2015-11-28 NOTE — Assessment & Plan Note (Signed)
Encouraged him to take his Lasix as needed for leg edema, has not been taking this for the past several days He does have 1+ pitting edema to the mid shins likely secondary to renal dysfunction/failure

## 2015-11-28 NOTE — Assessment & Plan Note (Signed)
Followed by nephrology at [facility], Dr. Juanito Doom May be close to dialysis but creatinine has been stable so far

## 2015-11-28 NOTE — Patient Instructions (Addendum)
You are doing well. Blood pressure is ok  Ok to stop the amlodipine and metoprolol  Take lasix as needed for leg swelling  Please call us if you have new issues that need to be addressed before your next appt.  Your physician wants you to follow-up in: 12 months.  You will receive a reminder letter in the mail two months in advance. If you don't receive a letter, please call our office to schedule the follow-up appointment.

## 2015-11-28 NOTE — Assessment & Plan Note (Signed)
No further episodes of chest pain. Prior stress test March 2016 with no ischemia

## 2016-02-09 ENCOUNTER — Encounter: Payer: Self-pay | Admitting: *Deleted

## 2016-02-09 ENCOUNTER — Emergency Department
Admission: EM | Admit: 2016-02-09 | Discharge: 2016-02-09 | Disposition: A | Discharge status: Home / Self Care | Payer: Medicare Other | Attending: Emergency Medicine | Admitting: Emergency Medicine

## 2016-02-09 DIAGNOSIS — Z7982 Long term (current) use of aspirin: Secondary | ICD-10-CM | POA: Insufficient documentation

## 2016-02-09 DIAGNOSIS — R112 Nausea with vomiting, unspecified: Secondary | ICD-10-CM | POA: Insufficient documentation

## 2016-02-09 DIAGNOSIS — E119 Type 2 diabetes mellitus without complications: Secondary | ICD-10-CM | POA: Diagnosis not present

## 2016-02-09 DIAGNOSIS — Z79899 Other long term (current) drug therapy: Secondary | ICD-10-CM | POA: Insufficient documentation

## 2016-02-09 DIAGNOSIS — N184 Chronic kidney disease, stage 4 (severe): Secondary | ICD-10-CM | POA: Diagnosis not present

## 2016-02-09 DIAGNOSIS — I129 Hypertensive chronic kidney disease with stage 1 through stage 4 chronic kidney disease, or unspecified chronic kidney disease: Secondary | ICD-10-CM | POA: Insufficient documentation

## 2016-02-09 LAB — CBC
HEMATOCRIT: 33.3 % — AB (ref 40.0–52.0)
HEMOGLOBIN: 11 g/dL — AB (ref 13.0–18.0)
MCH: 29.9 pg (ref 26.0–34.0)
MCHC: 32.9 g/dL (ref 32.0–36.0)
MCV: 90.7 fL (ref 80.0–100.0)
Platelets: 288 10*3/uL (ref 150–440)
RBC: 3.68 MIL/uL — AB (ref 4.40–5.90)
RDW: 13.8 % (ref 11.5–14.5)
WBC: 15.4 10*3/uL — ABNORMAL HIGH (ref 3.8–10.6)

## 2016-02-09 LAB — COMPREHENSIVE METABOLIC PANEL
ALBUMIN: 3.3 g/dL — AB (ref 3.5–5.0)
ALK PHOS: 126 U/L (ref 38–126)
ALT: 26 U/L (ref 17–63)
AST: 30 U/L (ref 15–41)
Anion gap: 8 (ref 5–15)
BUN: 51 mg/dL — ABNORMAL HIGH (ref 6–20)
CHLORIDE: 110 mmol/L (ref 101–111)
CO2: 19 mmol/L — ABNORMAL LOW (ref 22–32)
Calcium: 9.1 mg/dL (ref 8.9–10.3)
Creatinine, Ser: 4.49 mg/dL — ABNORMAL HIGH (ref 0.61–1.24)
GFR calc Af Amer: 13 mL/min — ABNORMAL LOW (ref >60)
GFR calc non Af Amer: 12 mL/min — ABNORMAL LOW (ref >60)
GLUCOSE: 187 mg/dL — AB (ref 65–99)
Potassium: 4.3 mmol/L (ref 3.5–5.1)
Sodium: 137 mmol/L (ref 135–145)
Total Bilirubin: 0.7 mg/dL (ref 0.3–1.2)
Total Protein: 6.6 g/dL (ref 6.5–8.1)

## 2016-02-09 LAB — LIPASE, BLOOD: LIPASE: 38 U/L (ref 11–51)

## 2016-02-09 MED ORDER — ONDANSETRON HCL 4 MG PO TABS: 4.0000 mg | ORAL_TABLET | Freq: Three times a day (TID) | ORAL | Status: DC | PRN

## 2016-02-09 MED ORDER — ONDANSETRON 4 MG PO TBDP
4.0000 mg | ORAL_TABLET | Freq: Once | ORAL | Status: AC
  Administered 2016-02-09: 4 mg via ORAL
  Filled 2016-02-09: qty 1

## 2016-02-09 NOTE — ED Notes (Addendum)
Per interpeter [phone] pt states vomiting that began this AM, denies any pain or diaherra, states he went to the clinic yesterday for lower back pain and was given predisone and baclofen yesterday and states since then he has been vomiting

## 2016-02-09 NOTE — ED Provider Notes (Signed)
[facility] Emergency Department Provider Note    ____________________________________________  Time seen: ~1800  I have reviewed the triage vital signs and the nursing notes.   HISTORY  Chief Complaint Emesis   History limited by: [place] utilized   HPI Martin Gilbert is a 75 y.o. male who presents to the emergency department today because of concerns for nausea. Patient states that it started this morning. He states it is worse when he stands up. He has not had any true episodes of vomiting.The patient has not had any associated abdominal pain. Patient states he did start a couple medications yesterday for low back pain. These include prednisone and baclofen. No fevers. No chest pain or shortness of breath.    Past Medical History  Diagnosis Date  . CKD (chronic kidney disease), stage IV ([place])   . HTN (hypertension)   . Diabetes mellitus without complication ([place])   . Hyperlipidemia     Patient Active Problem List   Diagnosis Date Noted  . Essential hypertension 11/28/2015  . Pain in the chest 02/15/2015  . Neck pain 02/15/2015  . Diabetes type 2, uncontrolled ([place]) 02/15/2015  . Chronic kidney disease, stage IV (severe) ([place]) 02/15/2015  . Frequent headaches 02/15/2015    Past Surgical History  Procedure Laterality Date  . Hernia repair      Current Outpatient Rx  Name  Route  Sig  Dispense  Refill  . acetaminophen (TYLENOL) 500 MG tablet   Oral   Take 500 mg by mouth every 8 (eight) hours as needed.         Marland Kitchen allopurinol (ZYLOPRIM) 100 MG tablet   Oral   Take 100 mg by mouth daily.         Marland Kitchen aspirin 81 MG tablet   Oral   Take 81 mg by mouth daily.         Marland Kitchen atorvastatin (LIPITOR) 20 MG tablet   Oral   Take 20 mg by mouth daily.         Marland Kitchen doxazosin (CARDURA) 2 MG tablet   Oral   Take 2 mg by mouth daily.         . ferrous sulfate 325 (65 FE) MG tablet   Oral   Take 325  mg by mouth daily with breakfast.      11   . furosemide (LASIX) 40 MG tablet   Oral   Take 40 mg by mouth daily.         Marland Kitchen glipiZIDE (GLUCOTROL XL) 2.5 MG 24 hr tablet   Oral   Take 2.5 mg by mouth daily with breakfast.         . hydrALAZINE (APRESOLINE) 50 MG tablet   Oral   Take 50 mg by mouth 3 (three) times daily.         . Multiple Vitamin (MULTI-VITAMINS) TABS   Oral   Take 1 tablet by mouth daily.         . sodium bicarbonate 650 MG tablet   Oral   Take 650 mg by mouth 4 (four) times daily.         . Vitamin D, Cholecalciferol, 1000 UNITS TABS   Oral   Take by mouth.           Allergies Acetaminophen and Aspirin  Family History  Problem Relation Age of Onset  . Family history unknown: Yes    Social History Social History  Substance Use Topics  .  Smoking status: Never Smoker   . Smokeless tobacco: None  . Alcohol Use: No     Comment: Drank in the past, but has stopped for several years.     Review of Systems  Constitutional: Negative for fever. Cardiovascular: Negative for chest pain. Respiratory: Negative for shortness of breath. Gastrointestinal: Negative for abdominal pain. Positive for nausea Neurological: Negative for headaches, focal weakness or numbness.   10-point ROS otherwise negative.  ____________________________________________   PHYSICAL EXAM:  VITAL SIGNS: ED Triage Vitals  Enc Vitals Group     BP 02/09/16 1707 176/64 mmHg     Pulse Rate 02/09/16 1707 65     Resp 02/09/16 1707 18     Temp 02/09/16 1707 98.2 F (36.8 C)     Temp Source 02/09/16 1707 Oral     SpO2 02/09/16 1707 95 %     Weight 02/09/16 1707 180 lb (81.647 kg)     Height 02/09/16 1707 5' 6.93" (1.7 m)   Constitutional: Alert and oriented. Well appearing and in no distress. Eyes: Conjunctivae are normal. PERRL. Normal extraocular movements. ENT   Head: Normocephalic and atraumatic.   Nose: No congestion/rhinnorhea.   Mouth/Throat:  Mucous membranes are moist.   Neck: No stridor. Hematological/Lymphatic/Immunilogical: No cervical lymphadenopathy. Cardiovascular: Normal rate, regular rhythm.  No murmurs, rubs, or gallops. Respiratory: Normal respiratory effort without tachypnea nor retractions. Breath sounds are clear and equal bilaterally. No wheezes/rales/rhonchi. Gastrointestinal: Soft and nontender. No distention. There is no CVA tenderness. Genitourinary: Deferred Musculoskeletal: Normal range of motion in all extremities. No joint effusions.  No lower extremity tenderness nor edema. Neurologic:  Normal speech and language. No gross focal neurologic deficits are appreciated.  Skin:  Skin is warm, dry and intact. No rash noted. Psychiatric: Mood and affect are normal. Speech and behavior are normal. Patient exhibits appropriate insight and judgment.  ____________________________________________    LABS (pertinent positives/negatives)  Labs Reviewed  COMPREHENSIVE METABOLIC PANEL - Abnormal; Notable for the following:    CO2 19 (*)    Glucose, Bld 187 (*)    BUN 51 (*)    Creatinine, Ser 4.49 (*)    Albumin 3.3 (*)    GFR calc non Af Amer 12 (*)    GFR calc Af Amer 13 (*)    All other components within normal limits  CBC - Abnormal; Notable for the following:    WBC 15.4 (*)    RBC 3.68 (*)    Hemoglobin 11.0 (*)    HCT 33.3 (*)    All other components within normal limits  LIPASE, BLOOD     ____________________________________________   EKG  I, Nance Pear, attending physician, personally viewed and interpreted this EKG  EKG Time: 1829 Rate: 59 Rhythm: sinus bradycardia with 1st degree av block Axis: left axis deviation Intervals: qtc 457 QRS: narrow ST changes: no st elevation Impression: abnormal ekg ____________________________________________    RADIOLOGY  None   ____________________________________________   PROCEDURES  Procedure(s) performed: None  Critical Care  performed: No  ____________________________________________   INITIAL IMPRESSION / ASSESSMENT AND PLAN / ED COURSE  Pertinent labs & imaging results that were available during my care of the patient were reviewed by me and considered in my medical decision making (see chart for details).  Patient presented to the emergency department today because of concerns from some nausea and potential vomiting that started after initiating new medications of prednisone and baclofen. Patient's vital signs here without any fever or tachycardia. Mild leukocytosis on blood  work however could be related to steroid use. On physical exam patient without any abdominal tenderness, distention. Patient also did not complain of any abdominal tenderness. This point I think intra-abdominal infection unlikely. EKG was without any acute ischemic findings. Patient did feel better after Zofran. Was able to tolerate by mouth. Will discharge home. Did discuss return precautions.  ____________________________________________   FINAL CLINICAL IMPRESSION(S) / ED DIAGNOSES  Final diagnoses:  Nausea and vomiting, vomiting of unspecified type     Nance Pear, MD 02/09/16 2029

## 2016-02-09 NOTE — ED Notes (Signed)
AAOx3.  Skin warm and dry.  Discharge done through [facility].  Understanding verbalized.  D/C home

## 2016-02-09 NOTE — Discharge Instructions (Signed)
Please seek medical attention for any high fevers, chest pain, shortness of breath, change in behavior, persistent vomiting, bloody stool or any other new or concerning symptoms.   Nuseas y Vmitos (Nausea and Vomiting) La nusea es la sensacin de Tree surgeon en el estmago o de la necesidad de vomitar. El vmito es un reflejo por el que los contenidos del estmago salen por la boca. El vmito puede ocasionar prdida de lquidos del organismo (deshidratacin). Los nios y los [employer] pueden deshidratarse rpidamente (en especial si tambin tienen diarrea). Las nuseas y los vmitos son sntoma de un trastorno o enfermedad. Es importante Energy manager causa de los sntomas. CAUSAS  Irritacin directa de la membrana que cubre el [place]. Esta irritacin puede ser resultado del aumento de la produccin de cido, (reflujo gastroesofgico), infecciones, intoxicacin alimentaria, ciertos medicamentos (como antinflamatorios no esteroideos), consumo de alcohol o de tabaco.  Seales del cerebro.Estas seales pueden ser un dolor de cabeza, exposicin al calor, trastornos del odo interno, aumento de la presin en el cerebro por lesiones, infeccin, un tumor o conmocin cerebral, estmulos emocionales o problemas metablicos.  Una obstruccin en el tracto gastrointestinal (obstruccin intestinal).  Ciertas enfermedades como la diabetes, problemas en la vescula biliar, apendicitis, problemas renales, cncer, sepsis, sntomas atpicos de infarto o trastornos alimentarios.  Tratamientos mdicos como la quimioterapia y la radiacin.  Medicamentos que inducen al sueo (anestesia general) durante Clementeen Hoof. DIAGNSTICO  El mdico podr solicitarle algunos anlisis si los problemas no mejoran luego de algunos das. Tambin podrn pedirle anlisis si los sntomas son graves o si el motivo de los vmitos o las nuseas no est claro. Los [employer] ser:   Anlisis de [place].  Anlisis de  [place].  Pruebas de materia fecal.  Cultivos (para buscar evidencias de infeccin).  Radiografas u otros estudios por imgenes. Los [employer] de las pruebas lo ayudarn al mdico a tomar decisiones acerca del mejor curso de tratamiento o la necesidad de [employer].  TRATAMIENTO  Debe estar bien hidratado. Beba con frecuencia pequeas cantidades de lquido.Puede beber agua, bebidas deportivas, caldos claros o comer pequeos trocitos de hielo o gelatina para mantenerse hidratado.Cuando coma, hgalo lentamente para evitar las nuseas.Hay medicamentos para evitar las nuseas que pueden aliviarlo.  INSTRUCCIONES PARA EL CUIDADO DOMICILIARIO  Si su mdico le prescribe medicamentos tmelos como se le haya indicado.  Si no tiene hambre, no se fuerce a comer. Sin embargo, es necesario que tome lquidos.  Si tiene hambre alimntese con una dieta normal, a menos que el mdico le indique otra cosa.  Los mejores alimentos son Ardelia Mems combinacin de carbohidratos complejos (arroz, trigo, papas, pan), carnes magras, yogur, frutas y Photographer.  Evite los alimentos ricos en grasas porque dificultan la digestin.  Beba gran cantidad de lquido para mantener la orina de tono claro o color amarillo plido.  Si est deshidratado, consulte a su mdico para que le d instrucciones especficas para volver a hidratarlo. Los signos de deshidratacin son:  Doristine Section sed.  Labios y boca secos.  Mareos.  Elmon Else.  Disminucin de la frecuencia y cantidad de la [place].  Confusin.  Tiene el pulso o la respiracin acelerados. SOLICITE ATENCIN MDICA DE INMEDIATO SI:  Vomita sangre o algo similar a la borra del caf.  La materia fecal (heces) es negra o tiene [place].  Sufre una cefalea grave o rigidez en el cuello.  Se siente confundido.  Siente dolor abdominal intenso.  Tiene dolor en el pecho o dificultad para respirar.  No  orina por 8 horas.  Tiene la piel fra y  pegajosa.  Sigue vomitando durante ms de 24 a 48 horas.  Tiene fiebre. ASEGRESE QUE:   Comprende estas instrucciones.  Controlar su enfermedad.  Solicitar ayuda inmediatamente si no mejora o si empeora.   Esta informacin no tiene Marine scientist el consejo del mdico. Asegrese de hacerle al mdico cualquier pregunta que tenga.   Document Released: 12/22/2007 Document Revised: 02/24/2012 Elsevier Interactive Patient Education [employer].

## 2016-02-11 ENCOUNTER — Emergency Department
Admission: EM | Admit: 2016-02-11 | Discharge: 2016-02-12 | Disposition: A | Discharge status: Home / Self Care | Payer: Medicare Other | Attending: Emergency Medicine | Admitting: Emergency Medicine

## 2016-02-11 ENCOUNTER — Encounter: Payer: Self-pay | Admitting: Emergency Medicine

## 2016-02-11 DIAGNOSIS — E119 Type 2 diabetes mellitus without complications: Secondary | ICD-10-CM | POA: Insufficient documentation

## 2016-02-11 DIAGNOSIS — R1084 Generalized abdominal pain: Secondary | ICD-10-CM | POA: Diagnosis not present

## 2016-02-11 DIAGNOSIS — M549 Dorsalgia, unspecified: Secondary | ICD-10-CM | POA: Diagnosis not present

## 2016-02-11 DIAGNOSIS — R51 Headache: Secondary | ICD-10-CM

## 2016-02-11 DIAGNOSIS — Z79899 Other long term (current) drug therapy: Secondary | ICD-10-CM | POA: Diagnosis not present

## 2016-02-11 DIAGNOSIS — R531 Weakness: Secondary | ICD-10-CM | POA: Diagnosis not present

## 2016-02-11 DIAGNOSIS — G44019 Episodic cluster headache, not intractable: Secondary | ICD-10-CM | POA: Insufficient documentation

## 2016-02-11 DIAGNOSIS — I129 Hypertensive chronic kidney disease with stage 1 through stage 4 chronic kidney disease, or unspecified chronic kidney disease: Secondary | ICD-10-CM | POA: Diagnosis not present

## 2016-02-11 DIAGNOSIS — R519 Headache, unspecified: Secondary | ICD-10-CM

## 2016-02-11 DIAGNOSIS — R110 Nausea: Secondary | ICD-10-CM | POA: Insufficient documentation

## 2016-02-11 DIAGNOSIS — N184 Chronic kidney disease, stage 4 (severe): Secondary | ICD-10-CM | POA: Insufficient documentation

## 2016-02-11 DIAGNOSIS — Z7982 Long term (current) use of aspirin: Secondary | ICD-10-CM | POA: Diagnosis not present

## 2016-02-11 HISTORY — DX: Benign prostatic hyperplasia without lower urinary tract symptoms: N40.0

## 2016-02-11 HISTORY — DX: Dorsalgia, unspecified: M54.9

## 2016-02-11 LAB — CBC
HCT: 37.5 % — ABNORMAL LOW (ref 40.0–52.0)
Hemoglobin: 12.6 g/dL — ABNORMAL LOW (ref 13.0–18.0)
MCH: 30.8 pg (ref 26.0–34.0)
MCHC: 33.6 g/dL (ref 32.0–36.0)
MCV: 91.8 fL (ref 80.0–100.0)
Platelets: 280 10*3/uL (ref 150–440)
RBC: 4.08 MIL/uL — AB (ref 4.40–5.90)
RDW: 14.6 % — AB (ref 11.5–14.5)
WBC: 8.7 10*3/uL (ref 3.8–10.6)

## 2016-02-11 LAB — COMPREHENSIVE METABOLIC PANEL
ALT: 21 U/L (ref 17–63)
AST: 29 U/L (ref 15–41)
Albumin: 3.4 g/dL — ABNORMAL LOW (ref 3.5–5.0)
Alkaline Phosphatase: 103 U/L (ref 38–126)
Anion gap: 9 (ref 5–15)
BILIRUBIN TOTAL: 1.1 mg/dL (ref 0.3–1.2)
BUN: 53 mg/dL — AB (ref 6–20)
CO2: 22 mmol/L (ref 22–32)
CREATININE: 4.26 mg/dL — AB (ref 0.61–1.24)
Calcium: 8.7 mg/dL — ABNORMAL LOW (ref 8.9–10.3)
Chloride: 111 mmol/L (ref 101–111)
GFR, EST AFRICAN AMERICAN: 14 mL/min — AB (ref >60)
GFR, EST NON AFRICAN AMERICAN: 12 mL/min — AB (ref >60)
Glucose, Bld: 266 mg/dL — ABNORMAL HIGH (ref 65–99)
POTASSIUM: 3.9 mmol/L (ref 3.5–5.1)
Sodium: 142 mmol/L (ref 135–145)
TOTAL PROTEIN: 6.8 g/dL (ref 6.5–8.1)

## 2016-02-11 LAB — URINALYSIS COMPLETE WITH MICROSCOPIC (ARMC ONLY)
BILIRUBIN URINE: NEGATIVE
GLUCOSE, UA: 150 mg/dL — AB
Ketones, ur: NEGATIVE mg/dL
Leukocytes, UA: NEGATIVE
Nitrite: NEGATIVE
Protein, ur: >500 mg/dL — AB
Specific Gravity, Urine: 1.017 (ref 1.005–1.030)
pH: 5 (ref 5.0–8.0)

## 2016-02-11 LAB — TROPONIN I: Troponin I: 0.11 ng/mL — ABNORMAL HIGH (ref <0.031)

## 2016-02-11 LAB — LIPASE, BLOOD: Lipase: 49 U/L (ref 11–51)

## 2016-02-11 MED ORDER — IOHEXOL 240 MG/ML SOLN
25.0000 mL | INTRAMUSCULAR | Status: AC
  Administered 2016-02-11: 25 mL via ORAL

## 2016-02-11 NOTE — ED Notes (Signed)
Critical troponin of 0.11 called from lab. Charge rn notified, pt to go to room 17.

## 2016-02-11 NOTE — ED Notes (Signed)
Attempt to utilize interpreter pt's native language not available.

## 2016-02-11 NOTE — ED Notes (Signed)
RAC iv...multiple iv sticks recently

## 2016-02-11 NOTE — ED Notes (Signed)
Pt reports intermittent headache X 2 weeks. Pt c/o abdominal pain rated at a 8 out of 10 with abdominal tenderness in all four quadrants. Pt reports abdominal swelling/bloating. Pt denies N/V/D. Pt c/o weakness in both hands - pt unable to grasp objects X 3 days. Pt also c/o of weakness X 3 days. Pt has slightly stronger grip in left hand. Pt has weak plantarflexion in both feet.

## 2016-02-11 NOTE — ED Notes (Signed)
Pt complains of headache, generalized abd pain for 2 days since starting baclofen and zofran for back pain and nausea. Pt denies diarrhea, vomiting, nausea currently. Pt denies fever. Skin normal color warm and dry. Pt with no facial droop noted, strong equal bilateral upper extremity grips. Pt's daughter states pt has been dropping "things out of his hands since he started the medicine." pt moving all extremities without difficulty.

## 2016-02-12 ENCOUNTER — Emergency Department: Payer: Medicare Other

## 2016-02-12 DIAGNOSIS — G44019 Episodic cluster headache, not intractable: Secondary | ICD-10-CM | POA: Diagnosis not present

## 2016-02-12 LAB — LACTIC ACID, PLASMA: Lactic Acid, Venous: 0.9 mmol/L (ref 0.5–2.0)

## 2016-02-12 LAB — TROPONIN I: TROPONIN I: 0.1 ng/mL — AB (ref <0.031)

## 2016-02-12 MED ORDER — GI COCKTAIL ~~LOC~~
30.0000 mL | Freq: Once | ORAL | Status: AC
  Administered 2016-02-12: 30 mL via ORAL
  Filled 2016-02-12: qty 30

## 2016-02-12 MED ORDER — OXYCODONE HCL 5 MG PO TABS
5.0000 mg | ORAL_TABLET | Freq: Once | ORAL | Status: AC
  Administered 2016-02-12: 5 mg via ORAL
  Filled 2016-02-12: qty 1

## 2016-02-12 NOTE — ED Notes (Signed)
Pt discharged to home with family.  Daughter at [facility] to verbalize understanding of discharge paperwork and follow-up instructions.  Pt reports improved pain with GI cocktail, ambulatory to discharge.

## 2016-02-12 NOTE — ED Provider Notes (Signed)
[facility] Emergency Department Provider Note  ____________________________________________  Time seen: Approximately 2309 AM  I have reviewed the triage vital signs and the nursing notes.   HISTORY  Chief Complaint Abdominal Pain and Headache    HPI Martin Gilbert is a 75 y.o. male who comes into the hospital today with abdominal pain with swelling and bloating. The patient's family also reports that his head is been hurting. He felt as though something was shooting him in the head and his arms felt weak. He was unable to hold anything on the bottom of his feet have been hurting. According to the family the patient's been having these pains since Thursday or Friday. He had been having some back pain and went to the acute care clinic where he was given medicine. The medicine made him nauseous so he came into the hospital on Friday to be seen. He was given pills and has been doing well but the pain in his belly started. His pain is in his entire abdomen. He has not been taking anything for the pain. The headache comes and goes and currently he has no headache. He denies any nausea vomiting or diarrhea and has had no urinary problems. The patient has some kidney disease but is not on dialysis. He's had no chest pain or shortness of breath. The patient rates his abdominal pain and 8 out of 10 in intensity. He is here for evaluation.   Past Medical History  Diagnosis Date  . CKD (chronic kidney disease), stage IV ([place])   . HTN (hypertension)   . Diabetes mellitus without complication ([place])   . Hyperlipidemia   . Back pain   . BPH (benign prostatic hyperplasia)     Patient Active Problem List   Diagnosis Date Noted  . Essential hypertension 11/28/2015  . Pain in the chest 02/15/2015  . Neck pain 02/15/2015  . Diabetes type 2, uncontrolled ([place]) 02/15/2015  . Chronic kidney disease, stage IV (severe) ([place]) 02/15/2015  . Frequent headaches 02/15/2015     Past Surgical History  Procedure Laterality Date  . Hernia repair      Current Outpatient Rx  Name  Route  Sig  Dispense  Refill  . acetaminophen (TYLENOL) 500 MG tablet   Oral   Take 500 mg by mouth every 8 (eight) hours as needed.         Marland Kitchen allopurinol (ZYLOPRIM) 100 MG tablet   Oral   Take 100 mg by mouth daily.         Marland Kitchen aspirin 81 MG tablet   Oral   Take 81 mg by mouth daily.         Marland Kitchen atorvastatin (LIPITOR) 20 MG tablet   Oral   Take 20 mg by mouth daily.         . baclofen (LIORESAL) 10 MG tablet   Oral   Take 10 mg by mouth 3 (three) times daily.         Marland Kitchen doxazosin (CARDURA) 2 MG tablet   Oral   Take 2 mg by mouth daily.         . ferrous sulfate 325 (65 FE) MG tablet   Oral   Take 325 mg by mouth daily with breakfast.      11   . furosemide (LASIX) 40 MG tablet   Oral   Take 40 mg by mouth daily.         . hydrALAZINE (APRESOLINE) 50 MG tablet  Oral   Take 50 mg by mouth 3 (three) times daily.         . ondansetron (ZOFRAN) 4 MG tablet   Oral   Take 1 tablet (4 mg total) by mouth every 8 (eight) hours as needed for nausea or vomiting.   20 tablet   0   . sodium bicarbonate 650 MG tablet   Oral   Take 650 mg by mouth 4 (four) times daily.         . Vitamin D, Cholecalciferol, 1000 UNITS TABS   Oral   Take by mouth.         Marland Kitchen glipiZIDE (GLUCOTROL XL) 2.5 MG 24 hr tablet   Oral   Take 2.5 mg by mouth daily with breakfast.         . Multiple Vitamin (MULTI-VITAMINS) TABS   Oral   Take 1 tablet by mouth daily.           Allergies Acetaminophen and Aspirin  Family History  Problem Relation Age of Onset  . Family history unknown: Yes    Social History Social History  Substance Use Topics  . Smoking status: Never Smoker   . Smokeless tobacco: Never Used  . Alcohol Use: No     Comment: Drank in the past, but has stopped for several years.     Review of Systems Constitutional: No fever/chills Eyes:  No visual changes. ENT: No sore throat. Cardiovascular: Denies chest pain. Respiratory: Denies shortness of breath. Gastrointestinal: abdominal pain and nausea, no vomiting.  No diarrhea.  No constipation. Genitourinary: Negative for dysuria. Musculoskeletal: back pain. Skin: Negative for rash. Neurological: Bilateral upper extremity weakness, intermittent headache  10-point ROS otherwise negative.  ____________________________________________   PHYSICAL EXAM:  VITAL SIGNS: ED Triage Vitals  Enc Vitals Group     BP 02/11/16 1912 152/91 mmHg     Pulse Rate 02/11/16 1912 94     Resp 02/11/16 1912 16     Temp 02/11/16 1912 98 F (36.7 C)     Temp Source 02/11/16 1912 Oral     SpO2 02/11/16 1912 95 %     Weight 02/11/16 1912 170 lb (77.111 kg)     Height 02/11/16 1912 5\' 6"  (1.676 m)     Head Cir --      Peak Flow --      Pain Score 02/11/16 1913 8     Pain Loc --      Pain Edu? --      Excl. in [place]? --     Constitutional: Alert and oriented. Well appearing and in mild distress. Eyes: Conjunctivae are normal. PERRL. EOMI. Head: Atraumatic. Nose: No congestion/rhinnorhea. Mouth/Throat: Mucous membranes are moist.  Oropharynx non-erythematous. Cardiovascular: Normal rate, regular rhythm. Grossly normal heart sounds.  Good peripheral circulation. Respiratory: Normal respiratory effort.  No retractions. Lungs CTAB. Gastrointestinal: Soft and diffusely. No distention. Positive bowel sounds Musculoskeletal: No lower extremity tenderness nor edema.  Neurologic:  Normal speech and language. Cranial nerves II through XII are grossly intact with no focal motor or neuro deficit Skin:  Skin is warm, dry and intact.  Psychiatric: Mood and affect are normal.   ____________________________________________   LABS (all labs ordered are listed, but only abnormal results are displayed)  Labs Reviewed  COMPREHENSIVE METABOLIC PANEL - Abnormal; Notable for the following:    Glucose,  Bld 266 (*)    BUN 53 (*)    Creatinine, Ser 4.26 (*)    Calcium 8.7 (*)  Albumin 3.4 (*)    GFR calc non Af Amer 12 (*)    GFR calc Af Amer 14 (*)    All other components within normal limits  CBC - Abnormal; Notable for the following:    RBC 4.08 (*)    Hemoglobin 12.6 (*)    HCT 37.5 (*)    RDW 14.6 (*)    All other components within normal limits  URINALYSIS COMPLETEWITH MICROSCOPIC ([facility] ONLY) - Abnormal; Notable for the following:    Color, Urine YELLOW (*)    APPearance CLEAR (*)    Glucose, UA 150 (*)    Hgb urine dipstick 1+ (*)    Protein, ur >500 (*)    Bacteria, UA RARE (*)    Squamous Epithelial / LPF 0-5 (*)    All other components within normal limits  TROPONIN I - Abnormal; Notable for the following:    Troponin I 0.11 (*)    All other components within normal limits  TROPONIN I - Abnormal; Notable for the following:    Troponin I 0.10 (*)    All other components within normal limits  LIPASE, BLOOD  LACTIC ACID, PLASMA   ____________________________________________  EKG  ED ECG REPORT I, Loney Hering, the attending physician, personally viewed and interpreted this ECG.   Date: 02/11/2016  EKG Time: 1924  Rate: 81  Rhythm: atrial fibrillation, versus sinus arrhythmia  Axis: Normal  Intervals:none  ST&T Change: None  ____________________________________________  RADIOLOGY  CT head: No acuter intracranial hemorrhage, mild are related atrophy and chronic microvascular ischemic disease.  CT abd and pelvis: No acute intra abdominal or pelvis pathology, no evidence of bowel obstruction or inflammation, Normal appendix, Apparent irregularity of the hepatic contour concerning for underlying cirrhosis.  ____________________________________________   PROCEDURES  Procedure(s) performed: None  Critical Care performed: No  ____________________________________________   INITIAL IMPRESSION / ASSESSMENT AND PLAN / ED COURSE  Pertinent labs  & imaging results that were available during my care of the patient were reviewed by me and considered in my medical decision making (see chart for details).  This is a 75 year old male who comes into the hospital today with some abdominal pain. The pain has been going on for the past day. He had some nausea earlier in the weekend but that has been resolved. I will do a CT scan to evaluate the patient's abdominal pain and bloating. I will be and oral contrast only given the patient can disease.  Patient CT scan is unremarkable. When I did go into the room the patient was sleeping comfortably and when I went back to reassess and he was sitting up on the bed in no acute distress. I did give him a dose of oxycodone as well as a GI cocktail. I informed the patient and his family of the results of the CT scan his blood work which were unremarkable. At this time I am unsure of the cause of the patient's abdominal pain but I feel that he may be discharged home to follow back up with his primary care physician. The patient's family did ask if he should continue to take his medicine which she was given on Friday and I did inform her that should he feel nauseous he should take the medication. Otherwise the patient will be discharged home to follow-up with his primary care physician. ____________________________________________   FINAL CLINICAL IMPRESSION(S) / ED DIAGNOSES  Final diagnoses:  Generalized abdominal pain  Nonintractable episodic headache, unspecified headache type  Loney Hering, MD 02/12/16 [phone]

## 2016-02-12 NOTE — Discharge Instructions (Signed)
Dolor abdominal en adultos (Abdominal Pain, Adult) El dolor puede tener muchas causas. Normalmente la causa del dolor abdominal no es una enfermedad y Teacher, English as a foreign language sin Clinical research associate. Frecuentemente puede controlarse y tratarse en casa. Su mdico le Chartered certified accountant examen fsico y posiblemente solicite anlisis de sangre y radiografas para ayudar a Teacher, adult education la gravedad de su dolor. Sin embargo, en [employer], debe transcurrir ms tiempo antes de que se pueda Pension scheme manager una causa evidente del dolor. Antes de llegar a ese punto, es posible que su mdico no sepa si necesita ms pruebas o un tratamiento ms profundo. INSTRUCCIONES PARA EL CUIDADO EN EL HOGAR  Est atento al dolor para ver si hay cambios. Las siguientes indicaciones ayudarn a Chief Strategy Officer que pueda sentir:  Buena solo medicamentos de venta libre o recetados, segn las indicaciones del mdico.  No tome laxantes a menos que se lo haya indicado su mdico.  Pruebe con Ardelia Mems dieta lquida absoluta (caldo, t o agua) segn se lo indique su mdico. Introduzca gradualmente una dieta normal, segn su tolerancia. SOLICITE ATENCIN MDICA SI:  Tiene dolor abdominal sin explicacin.  Tiene dolor abdominal relacionado con nuseas o diarrea.  Tiene dolor cuando orina o defeca.  Experimenta dolor abdominal que lo despierta de noche.  Tiene dolor abdominal que empeora o mejora cuando come alimentos.  Tiene dolor abdominal que empeora cuando come alimentos grasosos.  Tiene fiebre. SOLICITE ATENCIN MDICA DE INMEDIATO SI:   El dolor no desaparece en un plazo mximo de 2horas.  No deja de (vomitar).  El Social research officer, government se siente solo en partes del abdomen, como el lado derecho o la parte inferior izquierda del abdomen.  Evaca materia fecal sanguinolenta o negra, de aspecto alquitranado. ASEGRESE DE QUE:  Comprende estas instrucciones.  Controlar su afeccin.  Recibir ayuda de inmediato si no mejora o si empeora.   Esta  informacin no tiene Marine scientist el consejo del mdico. Asegrese de hacerle al mdico cualquier pregunta que tenga.   Document Released: 12/02/2005 Document Revised: 12/23/2014 Elsevier Interactive Patient Education 2016 [place] general sin causa (General Headache Without Cause) El dolor de cabeza es un dolor o Tree surgeon que se siente en la zona de la cabeza o del cuello. Puede no tener una causa especfica. Hay muchas causas y tipos de dolores de [place]. Los dolores de cabeza ms comunes son los siguientes:  Cefalea tensional.  Cefaleas migraosas.  Cefalea en brotes.  Cefaleas diarias crnicas. INSTRUCCIONES PARA EL CUIDADO EN EL HOGAR  Controle su afeccin para ver si hay cambios. Siga estos pasos para Aeronautical engineer afeccin: Control del [employer] medicamentos de venta libre y los recetados solamente como se lo haya indicado el mdico.  Cuando sienta dolor de cabeza acustese en un cuarto oscuro y tranquilo.  Si se lo indican, aplique hielo sobre la cabeza y la zona del cuello:  Ponga el hielo en una bolsa plstica.  Coloque una toalla entre la piel y la bolsa de hielo.  Coloque el hielo durante 74minutos, 2 a 3veces por Training and development officer.  Utilice una almohadilla trmica o tome una ducha con agua caliente para aplicar calor en la cabeza y la zona del cuello como se lo haya indicado el [place] luces tenues si le [employer] luces brillantes o sus dolores de cabeza empeoran. Comida y bebida  Mantenga un horario para las comidas.  Limite el consumo de bebidas alcohlicas.  Consuma menos cantidad de cafena o deje de tomarla.  Instrucciones generales  Concurra a todas las visitas de control como se lo haya indicado el mdico. Esto es importante.  Lleve un diario de los dolores de cabeza para Neurosurgeon qu factores pueden desencadenarlos. Por ejemplo, escriba los siguientes datos:  Lo que usted come y Buyer, retail.  Cunto tiempo  duerme.  Algn cambio en su dieta o en los medicamentos.  Pruebe algunas tcnicas de relajacin, como los [place].  Limite el estrs.  Sintese con la espalda recta y no tense los msculos.  No consuma productos que contengan tabaco, incluidos cigarrillos, tabaco de Higher education careers adviser o cigarrillos electrnicos. Si necesita ayuda para dejar de fumar, consulte al mdico.  Haga actividad fsica habitualmente como se lo haya indicado el mdico.  Tenga un horario fijo para dormir. Duerma entre 7 y 9horas o la cantidad de horas que le haya recomendado el mdico. SOLICITE ATENCIN MDICA SI:   Los medicamentos no Dealer los sntomas.  Tiene un dolor de cabeza que es diferente del dolor de cabeza habitual.  Tiene nuseas o vmitos.  Tiene fiebre. SOLICITE ATENCIN MDICA DE INMEDIATO SI:   El dolor se hace cada vez ms intenso.  Ha vomitado repetidas veces.  Presenta rigidez en el cuello.  Sufre prdida de la visin.  Tiene problemas para hablar.  Siente dolor en el ojo o en el odo.  Presenta debilidad muscular o prdida del control muscular.  Pierde el equilibrio o tiene problemas para Writer.  Sufre mareos o se desmaya.  Se siente confundido.   Esta informacin no tiene Marine scientist el consejo del mdico. Asegrese de hacerle al mdico cualquier pregunta que tenga.   Document Released: 09/11/2005 Document Revised: 08/23/2015 Elsevier Interactive Patient Education [employer].

## 2016-05-27 ENCOUNTER — Emergency Department
Admission: EM | Admit: 2016-05-27 | Discharge: 2016-05-27 | Disposition: A | Discharge status: Home / Self Care | Payer: Medicare Other | Attending: Emergency Medicine | Admitting: Emergency Medicine

## 2016-05-27 ENCOUNTER — Encounter: Payer: Self-pay | Admitting: Emergency Medicine

## 2016-05-27 DIAGNOSIS — N184 Chronic kidney disease, stage 4 (severe): Secondary | ICD-10-CM | POA: Diagnosis not present

## 2016-05-27 DIAGNOSIS — Z79899 Other long term (current) drug therapy: Secondary | ICD-10-CM | POA: Diagnosis not present

## 2016-05-27 DIAGNOSIS — E1122 Type 2 diabetes mellitus with diabetic chronic kidney disease: Secondary | ICD-10-CM | POA: Insufficient documentation

## 2016-05-27 DIAGNOSIS — Z7982 Long term (current) use of aspirin: Secondary | ICD-10-CM | POA: Insufficient documentation

## 2016-05-27 DIAGNOSIS — Z7984 Long term (current) use of oral hypoglycemic drugs: Secondary | ICD-10-CM | POA: Insufficient documentation

## 2016-05-27 DIAGNOSIS — H6122 Impacted cerumen, left ear: Secondary | ICD-10-CM | POA: Diagnosis not present

## 2016-05-27 DIAGNOSIS — H9202 Otalgia, left ear: Secondary | ICD-10-CM

## 2016-05-27 DIAGNOSIS — I129 Hypertensive chronic kidney disease with stage 1 through stage 4 chronic kidney disease, or unspecified chronic kidney disease: Secondary | ICD-10-CM | POA: Insufficient documentation

## 2016-05-27 DIAGNOSIS — E785 Hyperlipidemia, unspecified: Secondary | ICD-10-CM | POA: Insufficient documentation

## 2016-05-27 MED ORDER — IBUPROFEN 800 MG PO TABS: 800.0000 mg | ORAL_TABLET | Freq: Three times a day (TID) | ORAL | Status: DC | PRN

## 2016-05-27 MED ORDER — CARBAMIDE PEROXIDE 6.5 % OT SOLN: 5.0000 [drp] | Freq: Two times a day (BID) | OTIC | Status: DC

## 2016-05-27 MED ORDER — BACLOFEN 10 MG PO TABS: 10.0000 mg | ORAL_TABLET | Freq: Three times a day (TID) | ORAL | Status: DC

## 2016-05-27 NOTE — ED Provider Notes (Signed)
[facility] Emergency Department Provider Note  ____________________________________________  Time seen: Approximately 10:39 AM  I have reviewed the triage vital signs and the nursing notes.   HISTORY  Chief Complaint Otalgia    HPI Martin Gilbert is a 75 y.o. male presents for sudden onset of left sided neck pain for several days. Patient states he woke up 2 days ago with pain in his left ear radiating down the neck. Denies chest pain. Denies difficulty breathing. Denies any hearing loss.   Past Medical History  Diagnosis Date  . CKD (chronic kidney disease), stage IV ([place])   . HTN (hypertension)   . Diabetes mellitus without complication ([place])   . Hyperlipidemia   . Back pain   . BPH (benign prostatic hyperplasia)     Patient Active Problem List   Diagnosis Date Noted  . Essential hypertension 11/28/2015  . Pain in the chest 02/15/2015  . Neck pain 02/15/2015  . Diabetes type 2, uncontrolled ([place]) 02/15/2015  . Chronic kidney disease, stage IV (severe) ([place]) 02/15/2015  . Frequent headaches 02/15/2015    Past Surgical History  Procedure Laterality Date  . Hernia repair      Current Outpatient Rx  Name  Route  Sig  Dispense  Refill  . acetaminophen (TYLENOL) 500 MG tablet   Oral   Take 500 mg by mouth every 8 (eight) hours as needed.         Marland Kitchen allopurinol (ZYLOPRIM) 100 MG tablet   Oral   Take 100 mg by mouth daily.         Marland Kitchen aspirin 81 MG tablet   Oral   Take 81 mg by mouth daily.         Marland Kitchen atorvastatin (LIPITOR) 20 MG tablet   Oral   Take 20 mg by mouth daily.         . baclofen (LIORESAL) 10 MG tablet   Oral   Take 1 tablet (10 mg total) by mouth 3 (three) times daily.   30 tablet   0   . carbamide peroxide (DEBROX) 6.5 % otic solution   Right Ear   Place 5 drops into the right ear 2 (two) times daily.   15 mL   2   . doxazosin (CARDURA) 2 MG tablet   Oral   Take 2 mg by mouth daily.         .  ferrous sulfate 325 (65 FE) MG tablet   Oral   Take 325 mg by mouth daily with breakfast.      11   . furosemide (LASIX) 40 MG tablet   Oral   Take 40 mg by mouth daily.         Marland Kitchen glipiZIDE (GLUCOTROL XL) 2.5 MG 24 hr tablet   Oral   Take 2.5 mg by mouth daily with breakfast.         . hydrALAZINE (APRESOLINE) 50 MG tablet   Oral   Take 50 mg by mouth 3 (three) times daily.         Marland Kitchen ibuprofen (ADVIL,MOTRIN) 800 MG tablet   Oral   Take 1 tablet (800 mg total) by mouth every 8 (eight) hours as needed.   30 tablet   0   . Multiple Vitamin (MULTI-VITAMINS) TABS   Oral   Take 1 tablet by mouth daily.         . sodium bicarbonate 650 MG tablet   Oral   Take 650 mg by  mouth 4 (four) times daily.         . Vitamin D, Cholecalciferol, 1000 UNITS TABS   Oral   Take by mouth.           Allergies Acetaminophen and Aspirin  Family History  Problem Relation Age of Onset  . Family history unknown: Yes    Social History Social History  Substance Use Topics  . Smoking status: Never Smoker   . Smokeless tobacco: Never Used  . Alcohol Use: No     Comment: Drank in the past, but has stopped for several years.     Review of Systems Constitutional: No fever/chills Eyes: No visual changes. ENT: No sore throat.Positive for left ear and neck pain. Cardiovascular: Denies chest pain. Respiratory: Denies shortness of breath. Musculoskeletal: Negative for back pain. Skin: Negative for rash. Neurological: Negative for headaches, focal weakness or numbness.  10-point ROS otherwise negative.  ____________________________________________   PHYSICAL EXAM:  VITAL SIGNS: ED Triage Vitals  Enc Vitals Group     BP 05/27/16 0940 154/56 mmHg     Pulse Rate 05/27/16 0940 87     Resp 05/27/16 0940 20     Temp 05/27/16 0940 98 F (36.7 C)     Temp Source 05/27/16 0940 Oral     SpO2 05/27/16 0940 94 %     Weight 05/27/16 0938 183 lb (83.008 kg)     Height --       Head Cir --      Peak Flow --      Pain Score 05/27/16 0937 8     Pain Loc --      Pain Edu? --      Excl. in [place]? --     Constitutional: Alert and oriented. Well appearing and in no acute distress. Eyes: Conjunctivae are normal. PERRL. EOMI. Head: Atraumatic.Left TM cerumen impacted. Right TM unremarkable. Nose: No congestion/rhinnorhea. Mouth/Throat: Mucous membranes are moist.  Oropharynx non-erythematous. Neck: No stridor.  Full range of motion painful with lateralization to the left. Cardiovascular: Normal rate, regular rhythm. Grossly normal heart sounds.  Good peripheral circulation. Respiratory: Normal respiratory effort.  No retractions. Lungs CTAB. Musculoskeletal: No lower extremity tenderness nor edema.  No joint effusions. Neurologic:  Normal speech and language. No gross focal neurologic deficits are appreciated. No gait instability. Skin:  Skin is warm, dry and intact. No rash noted. Psychiatric: Mood and affect are normal. Speech and behavior are normal.  ____________________________________________   LABS (all labs ordered are listed, but only abnormal results are displayed)  Labs Reviewed - No data to display ____________________________________________    PROCEDURES  Procedure(s) performed: None  Critical Care performed: No  ____________________________________________   INITIAL IMPRESSION / ASSESSMENT AND PLAN / ED COURSE  Pertinent labs & imaging results that were available during my care of the patient were reviewed by me and considered in my medical decision making (see chart for details).  Cerumen impaction left ear. Nonspecific left ear pain. Patient given prescription for Debrox and instructed to follow-up with Dr. Richardson Landry, ENT.  ____________________________________________   FINAL CLINICAL IMPRESSION(S) / ED DIAGNOSES  Final diagnoses:  Otalgia, left     This chart was dictated using voice recognition software/Dragon. Despite best  efforts to proofread, errors can occur which can change the meaning. Any change was purely unintentional.   Arlyss Repress, PA-C 05/27/16 1148  Daymon Larsen, MD 05/27/16 [phone]

## 2016-05-27 NOTE — ED Notes (Addendum)
Left ear pain for one week. Per interpreter.

## 2016-05-27 NOTE — ED Notes (Signed)
States he is having some pain to left side of neck for several days   Pain is there with movement but able to turn head w/o diff. Also having some pain to left ear with touch

## 2016-05-27 NOTE — ED Notes (Signed)
Irrigated left ear with 20 mls of warm water  No results  PA aware

## 2016-05-27 NOTE — ED Notes (Signed)
Irrigated ear with 39ml of warm water; no results.

## 2016-05-27 NOTE — Discharge Instructions (Signed)
Gotas ticas - Adultos (Ear Drops, Adult) Le han diagnosticado una afeccin que requiere que se coloque un medicamento en gotas en el odo externo. Elberon gotas en el odo afectado segn las instrucciones. Luego de [employer], deber recostarse con el odo afectado hacia arriba durante diez minutos, para que las gotas permanezcan en el canal auditivo y Journalist, newspaper canal. Contine usando las gotas ticas durante el tiempo que le haya indicado el mdico.  Antes de incorporarse, colquese suavemente una mota de algodn dentro del Control and instrumentation engineer. Deje suficiente algodn afuera como para retirarlo con facilidad. No intente empujar el algodn dentro del canal auditivo con un hisopo u otro instrumento.  No irrigue ni se lave los odos si tiene el tmpano perforado o fue sometido a [place] de [place], salvo que el mdico se lo haya indicado.  Cumpla con las visitas de control con el mdico segn las indicaciones.  Termine todo el medicamento o utilcelo por el tiempo que le haya indicado su mdico. Contine con el uso de las gotas aun si su problema parece haber mejorado luego de algunos das, o contine usndolas del modo en que le han indicado. SOLICITE ATENCIN MDICA SI:  Los sntomas empeoran o Engineer, water.  Nota una secrecin inusual que proviene del odo (en especial, si la secrecin tiene mal olor).  Tiene dificultad para escuchar.  Experimenta alguna forma de mareo intenso, en el que siente como si la habitacin le diera vueltas y tiene nuseas (vrtigo).  La parte externa del odo est irritada, hinchada o ambas. Estos pueden ser sntomas de Nurse, mental health. ASEGRESE DE QUE:   Comprende estas instrucciones.  Controlar su afeccin.  Recibir ayuda de inmediato si no mejora o si empeora.   Esta informacin no tiene Marine scientist el consejo del mdico. Asegrese de hacerle al mdico cualquier  pregunta que tenga.   Document Released: 12/02/2005 Document Revised: 12/23/2014 Elsevier Interactive Patient Education 2016 [place] odos (Earache) El dolor de odos, tambin llamado otalgia, puede tener muchas causas. Puede ser agudo, sordo o ardiente, transitorio o Agricultural engineer. Los dolores de odos pueden deberse a problemas de los odos, como infecciones en el odo medio o el conducto auditivo externo, lesiones, tapones de cera, presin en el odo medio o un cuerpo extrao en el odo. Tambin, a problemas en otras zonas, lo que se conoce como dolor referido. Por ejemplo, el dolor puede deberse a una faringitis, una infeccin dental o problemas de la mandbula o de la articulacin que se encuentra entre la mandbula y el crneo (articulacin temporomandibular o ATM). No siempre es fcil identificar la causa de un dolor de odos. La observacin cautelosa puede ser la [place] en el caso de algunos dolores de odos, [place] se descubra una causa clara. INSTRUCCIONES PARA EL CUIDADO EN EL HOGAR Controle su afeccin para ver si hay cambios. Las siguientes medidas pueden servir para Public house manager cualquier molestia que est sintiendo:  Tome los medicamentos solamente como se lo haya indicado el mdico. Esto incluye la aplicacin de las gotas ticas.  Pngase hielo en la oreja para ayudar a Best boy.  Ponga el hielo en una bolsa plstica.  Coloque una toalla entre la piel y la bolsa de hielo.  Coloque el hielo durante 26mnutos, 2 a 3veces por dTraining and development officer  No se ponga nada en el odo que no sean los medicamentos que el mdico  le recet.  Intente descansar en posicin erguida, en lugar de recostarse. Esto puede ayudar a reducir la presin en el odo medio y Best boy.  Mastique goma de mascar si esto ayuda a Best boy de odos.  Controle cualquier alergia que tenga.  Concurra a todas las visitas de control como se lo haya indicado el mdico. Esto es  importante. SOLICITE ATENCIN MDICA SI:  El dolor no mejora en el trmino de 2das.  Tiene fiebre.  Tiene sntomas nuevos o estos empeoran. SOLICITE ATENCIN MDICA DE INMEDIATO SI:  Sufre un dolor intenso de [place].  Presenta rigidez en el cuello.  Tiene dificultad para tragar.  Hay enrojecimiento o hinchazn detrs de la oreja.  Tiene secrecin del odo.  Tiene prdida de la audicin.  Siente mareos.   Esta informacin no tiene Marine scientist el consejo del mdico. Asegrese de hacerle al mdico cualquier pregunta que tenga.   Document Released: 03/10/2008 Document Revised: 12/23/2014 Elsevier Interactive Patient Education [employer].

## 2016-07-30 ENCOUNTER — Emergency Department
Admission: EM | Admit: 2016-07-30 | Discharge: 2016-07-30 | Disposition: A | Discharge status: Home / Self Care | Payer: Medicare Other | Attending: Emergency Medicine | Admitting: Emergency Medicine

## 2016-07-30 DIAGNOSIS — L299 Pruritus, unspecified: Secondary | ICD-10-CM

## 2016-07-30 DIAGNOSIS — N179 Acute kidney failure, unspecified: Secondary | ICD-10-CM

## 2016-07-30 DIAGNOSIS — E1122 Type 2 diabetes mellitus with diabetic chronic kidney disease: Secondary | ICD-10-CM | POA: Insufficient documentation

## 2016-07-30 DIAGNOSIS — I129 Hypertensive chronic kidney disease with stage 1 through stage 4 chronic kidney disease, or unspecified chronic kidney disease: Secondary | ICD-10-CM | POA: Diagnosis present

## 2016-07-30 DIAGNOSIS — L298 Other pruritus: Secondary | ICD-10-CM | POA: Diagnosis not present

## 2016-07-30 DIAGNOSIS — N184 Chronic kidney disease, stage 4 (severe): Secondary | ICD-10-CM | POA: Insufficient documentation

## 2016-07-30 DIAGNOSIS — Z7984 Long term (current) use of oral hypoglycemic drugs: Secondary | ICD-10-CM | POA: Insufficient documentation

## 2016-07-30 DIAGNOSIS — N189 Chronic kidney disease, unspecified: Secondary | ICD-10-CM

## 2016-07-30 LAB — URINALYSIS COMPLETE WITH MICROSCOPIC (ARMC ONLY)
Bacteria, UA: NONE SEEN
Bilirubin Urine: NEGATIVE
Glucose, UA: 50 mg/dL — AB
Hgb urine dipstick: NEGATIVE
KETONES UR: NEGATIVE mg/dL
LEUKOCYTES UA: NEGATIVE
Nitrite: NEGATIVE
PH: 6 (ref 5.0–8.0)
PROTEIN: 100 mg/dL — AB
RBC / HPF: NONE SEEN RBC/hpf (ref 0–5)
Specific Gravity, Urine: 1.01 (ref 1.005–1.030)

## 2016-07-30 LAB — CBC
HEMATOCRIT: 30.9 % — AB (ref 40.0–52.0)
HEMOGLOBIN: 10.4 g/dL — AB (ref 13.0–18.0)
MCH: 30.2 pg (ref 26.0–34.0)
MCHC: 33.6 g/dL (ref 32.0–36.0)
MCV: 90.1 fL (ref 80.0–100.0)
PLATELETS: 259 10*3/uL (ref 150–440)
RBC: 3.43 MIL/uL — AB (ref 4.40–5.90)
RDW: 14.5 % (ref 11.5–14.5)
WBC: 8.1 10*3/uL (ref 3.8–10.6)

## 2016-07-30 LAB — BASIC METABOLIC PANEL
ANION GAP: 10 (ref 5–15)
BUN: 64 mg/dL — ABNORMAL HIGH (ref 6–20)
CHLORIDE: 108 mmol/L (ref 101–111)
CO2: 20 mmol/L — AB (ref 22–32)
CREATININE: 5.86 mg/dL — AB (ref 0.61–1.24)
Calcium: 8.8 mg/dL — ABNORMAL LOW (ref 8.9–10.3)
GFR calc non Af Amer: 8 mL/min — ABNORMAL LOW (ref >60)
GFR, EST AFRICAN AMERICAN: 10 mL/min — AB (ref >60)
Glucose, Bld: 143 mg/dL — ABNORMAL HIGH (ref 65–99)
POTASSIUM: 3.9 mmol/L (ref 3.5–5.1)
SODIUM: 138 mmol/L (ref 135–145)

## 2016-07-30 LAB — GLUCOSE, CAPILLARY
GLUCOSE-CAPILLARY: 126 mg/dL — AB (ref 65–99)
GLUCOSE-CAPILLARY: 82 mg/dL (ref 65–99)

## 2016-07-30 NOTE — Discharge Instructions (Signed)
Debe hacer un seguimiento con el Dr. Orvil Feil a la 1 PM y su clnica ubicada en [address] en [place]. Vuelva al departamento de emergencia si usted desarrolla dificultad para respirar, palpitaciones, siente que va a desmayarse, confusin o cualquier otro sntoma relacionado con usted.  You must follow-up with Dr. Zollie Scale tomorrow at 1 PM and his clinic located at [facility] [address] in [place]. Return to the emergency department if you develop Shortness of breath, palpitations, you feel like you are going to pass out, confusion, or any other symptoms concerning to you.

## 2016-07-30 NOTE — ED Provider Notes (Signed)
[facility] Emergency Department Provider Note  ____________________________________________  Time seen: Approximately 3:13 PM  I have reviewed the triage vital signs and the nursing notes.   HISTORY  Chief Complaint Rash and Hyperglycemia   HPI Bashir Kusner is a 75 y.o. male a history of chronic kidney disease, BPH, diabetes type 2, hypertension, hyperlipidemia who presents for evaluation of pruritus and elevated blood sugars. Patient reports that he has been having pruritus for the last week which is progressively worse. He also reports that his sugars have been in the 600s at home. He is not an insulin just on oral glycemic agents. Patient was last seen a [facility] nephrology on June 2017 were discussions were started to start patient on hemodialysis. Patient was not interested at that time. Patient reported that he is interested in talking with a nephrologist here for second opinion. He reports that the pruritus is intense and diffuse in his entire body. Denies rash, fever, any new medications. He also denies chest pain, shortness of breath, fever, abdominal pain, nausea, vomiting.  Past Medical History:  Diagnosis Date  . Back pain   . BPH (benign prostatic hyperplasia)   . CKD (chronic kidney disease), stage IV ([place])   . Diabetes mellitus without complication ([place])   . HTN (hypertension)   . Hyperlipidemia     Patient Active Problem List   Diagnosis Date Noted  . Essential hypertension 11/28/2015  . Pain in the chest 02/15/2015  . Neck pain 02/15/2015  . Diabetes type 2, uncontrolled ([place]) 02/15/2015  . Chronic kidney disease, stage IV (severe) ([place]) 02/15/2015  . Frequent headaches 02/15/2015    Past Surgical History:  Procedure Laterality Date  . hernia repair      Prior to Admission medications   Medication Sig Start Date End Date Taking? Authorizing Provider  acetaminophen (TYLENOL) 500 MG tablet Take 500 mg by mouth every 8 (eight)  hours as needed.    Historical Provider, MD  allopurinol (ZYLOPRIM) 100 MG tablet Take 100 mg by mouth daily. 10/17/15   Historical Provider, MD  aspirin 81 MG tablet Take 81 mg by mouth daily.    Historical Provider, MD  atorvastatin (LIPITOR) 20 MG tablet Take 20 mg by mouth daily.    Historical Provider, MD  baclofen (LIORESAL) 10 MG tablet Take 1 tablet (10 mg total) by mouth 3 (three) times daily. 05/27/16   [name], PA-C  carbamide peroxide (DEBROX) 6.5 % otic solution Place 5 drops into the right ear 2 (two) times daily. 05/27/16 05/27/17  [name], PA-C  doxazosin (CARDURA) 2 MG tablet Take 2 mg by mouth daily.    Historical Provider, MD  ferrous sulfate 325 (65 FE) MG tablet Take 325 mg by mouth daily with breakfast. 08/30/15   Historical Provider, MD  furosemide (LASIX) 40 MG tablet Take 40 mg by mouth daily. 10/17/15   Historical Provider, MD  glipiZIDE (GLUCOTROL XL) 2.5 MG 24 hr tablet Take 2.5 mg by mouth daily with breakfast.    Historical Provider, MD  hydrALAZINE (APRESOLINE) 50 MG tablet Take 50 mg by mouth 3 (three) times daily.    Historical Provider, MD  ibuprofen (ADVIL,MOTRIN) 800 MG tablet Take 1 tablet (800 mg total) by mouth every 8 (eight) hours as needed. 05/27/16   Arlyss Repress, PA-C  Multiple Vitamin (MULTI-VITAMINS) TABS Take 1 tablet by mouth daily.    Historical Provider, MD  sodium bicarbonate 650 MG tablet Take 650 mg by mouth  4 (four) times daily.    Historical Provider, MD  Vitamin D, Cholecalciferol, 1000 UNITS TABS Take by mouth.    Historical Provider, MD    Allergies Acetaminophen and Aspirin  Family History  Problem Relation Age of Onset  . Family history unknown: Yes    Social History Social History  Substance Use Topics  . Smoking status: Never Smoker  . Smokeless tobacco: Never Used  . Alcohol use No     Comment: Drank in the past, but has stopped for several years.     Review of Systems  Constitutional: Negative for  fever. Eyes: Negative for visual changes. ENT: Negative for sore throat. Cardiovascular: Negative for chest pain. Respiratory: Negative for shortness of breath. Gastrointestinal: Negative for abdominal pain, vomiting or diarrhea. Genitourinary: Negative for dysuria. Musculoskeletal: Negative for back pain. Skin: Negative for rash. + itching Neurological: Negative for headaches, weakness or numbness.  ____________________________________________   PHYSICAL EXAM:  VITAL SIGNS: ED Triage Vitals  Enc Vitals Group     BP 07/30/16 1149 132/61     Pulse Rate 07/30/16 1149 75     Resp 07/30/16 1149 16     Temp 07/30/16 1149 98.3 F (36.8 C)     Temp Source 07/30/16 1149 Oral     SpO2 07/30/16 1149 96 %     Weight 07/30/16 1057 180 lb (81.6 kg)     Height 07/30/16 1057 5\' 10"  (1.778 m)     Head Circumference --      Peak Flow --      Pain Score --      Pain Loc --      Pain Edu? --      Excl. in [place]? --     Constitutional: Alert and oriented. Well appearing and in no apparent distress. HEENT:      Head: Normocephalic and atraumatic.         Eyes: Conjunctivae are normal. Sclera is non-icteric. EOMI. PERRL      Mouth/Throat: Mucous membranes are moist.       Neck: Supple with no signs of meningismus. Cardiovascular: Regular rate and rhythm. No murmurs, gallops, or rubs. 2+ symmetrical distal pulses are present in all extremities. No JVD. Respiratory: Normal respiratory effort. Lungs are clear to auscultation bilaterally. No wheezes, crackles, or rhonchi.  Gastrointestinal: Soft, non tender, and non distended with positive bowel sounds. No rebound or guarding. Genitourinary: No CVA tenderness. Musculoskeletal: Nontender with normal range of motion in all extremities. No edema, cyanosis, or erythema of extremities. Neurologic: Normal speech and language. Face is symmetric. Moving all extremities. No gross focal neurologic deficits are appreciated. Skin: Skin is warm, dry and  intact. No rash noted. Excoriations presents most on b/l wrists Psychiatric: Mood and affect are normal. Speech and behavior are normal.  ____________________________________________   LABS (all labs ordered are listed, but only abnormal results are displayed)  Labs Reviewed  BASIC METABOLIC PANEL - Abnormal; Notable for the following:       Result Value   CO2 20 (*)    Glucose, Bld 143 (*)    BUN 64 (*)    Creatinine, Ser 5.86 (*)    Calcium 8.8 (*)    GFR calc non Af Amer 8 (*)    GFR calc Af Amer 10 (*)    All other components within normal limits  CBC - Abnormal; Notable for the following:    RBC 3.43 (*)    Hemoglobin 10.4 (*)    HCT 30.9 (*)  All other components within normal limits  URINALYSIS COMPLETEWITH MICROSCOPIC ([facility] ONLY) - Abnormal; Notable for the following:    Color, Urine YELLOW (*)    APPearance CLEAR (*)    Glucose, UA 50 (*)    Protein, ur 100 (*)    Squamous Epithelial / LPF 0-5 (*)    All other components within normal limits  GLUCOSE, CAPILLARY - Abnormal; Notable for the following:    Glucose-Capillary 126 (*)    All other components within normal limits  GLUCOSE, CAPILLARY  CBG MONITORING, ED  CBG MONITORING, ED   ____________________________________________  EKG  none ____________________________________________  RADIOLOGY  none  ____________________________________________   PROCEDURES  Procedure(s) performed: None Procedures Critical Care performed:  None ____________________________________________   INITIAL IMPRESSION / ASSESSMENT AND PLAN / ED COURSE  75 y.o. male a history of chronic kidney disease, BPH, diabetes type 2, hypertension, hyperlipidemia who presents for evaluation of pruritus and elevated blood sugars. Patient's kidney function is deteriorating with current creatinine of 5.86. Patient is mildly acidotic with a bicarbonate of 20. His sugars are well-controlled here at 126. He has no hyperkalemia, no altered  mental status, no oxygen requirement, no shortness of breath. I discussed patient with Dr. Holley Raring from nephrology who was happy to see patient in his office tomorrow at 1 PM for discussion of initiation of dialysis. I do believe the patient's pruritus is uremic in nature as his BUN is 64. Dr. Holley Raring agrees that there is no true indication for emergent dialysis and admission at this time. Patient agrees to follow-up tomorrow at 1 PM. I had a long discussion with the patient about risks and benefits of not initiating dialysis at this time and explained to him the potential of toxin accumulation and even death. Patient understand these risks and will follow-up tomorrow at 1 PM. Will discharge patient at this time.  Clinical Course    Pertinent labs & imaging results that were available during my care of the patient were reviewed by me and considered in my medical decision making (see chart for details).    ____________________________________________   FINAL CLINICAL IMPRESSION(S) / ED DIAGNOSES  Final diagnoses:  Acute on chronic kidney failure ([name])  Uremic pruritus      NEW MEDICATIONS STARTED DURING THIS VISIT:  New Prescriptions   No medications on file     Note:  This document was prepared using Dragon voice recognition software and may include unintentional dictation errors.    Rudene Re, MD 07/30/16 [phone]

## 2016-07-30 NOTE — ED Notes (Signed)
Interpreter used for discharge.

## 2016-07-30 NOTE — ED Triage Notes (Signed)
Pt c/o itchy rash all over for the past 2 weeks and states this morning his glucose was >600 with a known hx of DM.Marland Kitchen

## 2016-07-30 NOTE — ED Notes (Signed)
Pt alert and oriented X4, active, cooperative, pt in NAD. RR even and unlabored, color WNL.  Pt informed to return if any life threatening symptoms occur.   

## 2016-09-01 ENCOUNTER — Emergency Department: Payer: Medicare Other

## 2016-09-01 ENCOUNTER — Emergency Department
Admission: EM | Admit: 2016-09-01 | Discharge: 2016-09-01 | Disposition: A | Discharge status: Home / Self Care | Payer: Medicare Other | Attending: Emergency Medicine | Admitting: Emergency Medicine

## 2016-09-01 ENCOUNTER — Other Ambulatory Visit: Payer: Self-pay

## 2016-09-01 ENCOUNTER — Encounter: Payer: Self-pay | Admitting: Emergency Medicine

## 2016-09-01 DIAGNOSIS — R1013 Epigastric pain: Secondary | ICD-10-CM | POA: Insufficient documentation

## 2016-09-01 DIAGNOSIS — Z79899 Other long term (current) drug therapy: Secondary | ICD-10-CM | POA: Diagnosis not present

## 2016-09-01 DIAGNOSIS — R079 Chest pain, unspecified: Secondary | ICD-10-CM

## 2016-09-01 DIAGNOSIS — E119 Type 2 diabetes mellitus without complications: Secondary | ICD-10-CM | POA: Diagnosis not present

## 2016-09-01 DIAGNOSIS — N184 Chronic kidney disease, stage 4 (severe): Secondary | ICD-10-CM | POA: Diagnosis not present

## 2016-09-01 DIAGNOSIS — I129 Hypertensive chronic kidney disease with stage 1 through stage 4 chronic kidney disease, or unspecified chronic kidney disease: Secondary | ICD-10-CM | POA: Diagnosis not present

## 2016-09-01 LAB — CBC
HCT: 30.6 % — ABNORMAL LOW (ref 40.0–52.0)
HEMOGLOBIN: 10.3 g/dL — AB (ref 13.0–18.0)
MCH: 31.2 pg (ref 26.0–34.0)
MCHC: 33.5 g/dL (ref 32.0–36.0)
MCV: 93.1 fL (ref 80.0–100.0)
PLATELETS: 223 10*3/uL (ref 150–440)
RBC: 3.29 MIL/uL — AB (ref 4.40–5.90)
RDW: 15 % — ABNORMAL HIGH (ref 11.5–14.5)
WBC: 5.7 10*3/uL (ref 3.8–10.6)

## 2016-09-01 LAB — BASIC METABOLIC PANEL
ANION GAP: 8 (ref 5–15)
BUN: 76 mg/dL — ABNORMAL HIGH (ref 6–20)
CALCIUM: 8.7 mg/dL — AB (ref 8.9–10.3)
CHLORIDE: 112 mmol/L — AB (ref 101–111)
CO2: 20 mmol/L — AB (ref 22–32)
Creatinine, Ser: 6.19 mg/dL — ABNORMAL HIGH (ref 0.61–1.24)
GFR calc non Af Amer: 8 mL/min — ABNORMAL LOW (ref >60)
GFR, EST AFRICAN AMERICAN: 9 mL/min — AB (ref >60)
Glucose, Bld: 163 mg/dL — ABNORMAL HIGH (ref 65–99)
Potassium: 4.5 mmol/L (ref 3.5–5.1)
SODIUM: 140 mmol/L (ref 135–145)

## 2016-09-01 LAB — TROPONIN I: TROPONIN I: 0.05 ng/mL — AB (ref <0.03)

## 2016-09-01 LAB — HEPATIC FUNCTION PANEL
ALT: 13 U/L — ABNORMAL LOW (ref 17–63)
AST: 24 U/L (ref 15–41)
Albumin: 3.1 g/dL — ABNORMAL LOW (ref 3.5–5.0)
Alkaline Phosphatase: 85 U/L (ref 38–126)
BILIRUBIN DIRECT: 0.2 mg/dL (ref 0.1–0.5)
BILIRUBIN INDIRECT: 0.7 mg/dL (ref 0.3–0.9)
BILIRUBIN TOTAL: 0.9 mg/dL (ref 0.3–1.2)
Total Protein: 6.6 g/dL (ref 6.5–8.1)

## 2016-09-01 LAB — LIPASE, BLOOD: Lipase: 54 U/L — ABNORMAL HIGH (ref 11–51)

## 2016-09-01 MED ORDER — ONDANSETRON HCL 4 MG/2ML IJ SOLN
4.0000 mg | Freq: Once | INTRAMUSCULAR | Status: AC
Start: 2016-09-01 — End: 2016-09-01
  Administered 2016-09-01: 4 mg via INTRAVENOUS
  Filled 2016-09-01: qty 2

## 2016-09-01 MED ORDER — MORPHINE SULFATE (PF) 4 MG/ML IV SOLN
4.0000 mg | Freq: Once | INTRAVENOUS | Status: AC
  Administered 2016-09-01: 4 mg via INTRAVENOUS
  Filled 2016-09-01: qty 1

## 2016-09-01 MED ORDER — OXYCODONE HCL 5 MG PO TABS: 5.0000 mg | ORAL_TABLET | Freq: Three times a day (TID) | ORAL | 0 refills | Status: DC | PRN

## 2016-09-01 NOTE — ED Notes (Signed)
Pt in via triage with complaints of central chest pain and epigastric pain without radiation x 4-5days.  Pt reports associated dizziness and shortness of breath.  Pt reports generalized weakness and diminished appetite x 1 month.  Pt A/Ox4, vitals WDL, no immediate distress at this time.  Pt speaks a native spanish dialect which our mobile interpreter does not have available.  Family at bedside.

## 2016-09-01 NOTE — ED Provider Notes (Signed)
[facility] Emergency Department Provider Note  Time seen: 1:44 PM  I have reviewed the triage vital signs and the nursing notes.  Attempted to use an interpreter for this visit however the patient speaks a dialect of Spanish that the interpreter could not interpret, so family is interpreting.   HISTORY  Chief Complaint Chest Pain    HPI Martin Gilbert is a 75 y.o. male with a past medical history of CK D, diabetes, hypertension, hyperlipidemia who presents the emergency department with epigastric and central chest pain. According to the patient for the past 5 days he has been experiencing a constant pain in the center upper abdomen and central lower chest. States the pain is moderate, somewhat better if he pushes on the area. Denies any nausea or vomiting. Denies diarrhea. Denies fever. Denies cough or congestion. Denies any worsening with food. Denies alcohol intake.  Past Medical History:  Diagnosis Date  . Back pain   . BPH (benign prostatic hyperplasia)   . CKD (chronic kidney disease), stage IV ([place])   . Diabetes mellitus without complication ([place])   . HTN (hypertension)   . Hyperlipidemia     Patient Active Problem List   Diagnosis Date Noted  . Essential hypertension 11/28/2015  . Pain in the chest 02/15/2015  . Neck pain 02/15/2015  . Diabetes type 2, uncontrolled ([place]) 02/15/2015  . Chronic kidney disease, stage IV (severe) ([place]) 02/15/2015  . Frequent headaches 02/15/2015    Past Surgical History:  Procedure Laterality Date  . hernia repair      Prior to Admission medications   Medication Sig Start Date End Date Taking? Authorizing Provider  acetaminophen (TYLENOL) 500 MG tablet Take 500 mg by mouth every 8 (eight) hours as needed.   Yes Historical Provider, MD  allopurinol (ZYLOPRIM) 100 MG tablet Take 100 mg by mouth daily. 10/17/15  Yes Historical Provider, MD  aspirin 81 MG tablet Take 81 mg by mouth daily.   Yes Historical  Provider, MD  atorvastatin (LIPITOR) 20 MG tablet Take 20 mg by mouth daily.   Yes Historical Provider, MD  baclofen (LIORESAL) 10 MG tablet Take 1 tablet (10 mg total) by mouth 3 (three) times daily. 05/27/16  Yes [name], PA-C  carbamide peroxide (DEBROX) 6.5 % otic solution Place 5 drops into the right ear 2 (two) times daily. 05/27/16 05/27/17 Yes Charles M Beers, PA-C  doxazosin (CARDURA) 2 MG tablet Take 2 mg by mouth daily.   Yes Historical Provider, MD  ferrous sulfate 325 (65 FE) MG tablet Take 325 mg by mouth daily with breakfast. 08/30/15  Yes Historical Provider, MD  furosemide (LASIX) 40 MG tablet Take 40 mg by mouth daily. 10/17/15  Yes Historical Provider, MD  glipiZIDE (GLUCOTROL XL) 2.5 MG 24 hr tablet Take 2.5 mg by mouth daily with breakfast.   Yes Historical Provider, MD  hydrALAZINE (APRESOLINE) 50 MG tablet Take 50 mg by mouth 3 (three) times daily.   Yes Historical Provider, MD  ibuprofen (ADVIL,MOTRIN) 800 MG tablet Take 1 tablet (800 mg total) by mouth every 8 (eight) hours as needed. 05/27/16  Yes [name], PA-C  Multiple Vitamin (MULTI-VITAMINS) TABS Take 1 tablet by mouth daily.   Yes Historical Provider, MD  sodium bicarbonate 650 MG tablet Take 650 mg by mouth 4 (four) times daily.   Yes Historical Provider, MD  Vitamin D, Cholecalciferol, 1000 UNITS TABS Take by mouth.   Yes Historical Provider, MD    Allergies  Allergen Reactions  . Acetaminophen Hives    Possible lip swelling.  Patient is not sure.  09/01/16- patient denies allergy  . Aspirin Rash    09/01/16- patient denies allergy    Family History  Problem Relation Age of Onset  . Family history unknown: Yes    Social History Social History  Substance Use Topics  . Smoking status: Never Smoker  . Smokeless tobacco: Never Used  . Alcohol use No     Comment: Drank in the past, but has stopped for several years.     Review of Systems Constitutional: Negative for fever. Cardiovascular:  Lower central chest pain Respiratory: Negative for shortness of breath. Gastrointestinal: Epigastric pain. Musculoskeletal: Negative for back pain. Neurological: Negative for headache 10-point ROS otherwise negative.  ____________________________________________   PHYSICAL EXAM:  VITAL SIGNS: ED Triage Vitals  Enc Vitals Group     BP 09/01/16 1046 (!) 138/52     Pulse Rate 09/01/16 1046 80     Resp 09/01/16 1046 16     Temp 09/01/16 1046 97.9 F (36.6 C)     Temp Source 09/01/16 1046 Oral     SpO2 09/01/16 1046 94 %     Weight 09/01/16 1048 180 lb (81.6 kg)     Height 09/01/16 1048 5\' 4"  (1.626 m)     Head Circumference --      Peak Flow --      Pain Score 09/01/16 1048 10     Pain Loc --      Pain Edu? --      Excl. in [place]? --     Constitutional: Alert and oriented. Well appearing and in no distress. Eyes: Normal exam ENT   Head: Normocephalic and atraumatic.   Mouth/Throat: Mucous membranes are moist. Cardiovascular: Normal rate, regular rhythm. No murmur Respiratory: Normal respiratory effort without tachypnea nor retractions. Breath sounds are clear Gastrointestinal: Soft, moderate epigastric tenderness palpation. No rebound or guarding. No distention.  Musculoskeletal: Nontender with normal range of motion in all extremities.  Neurologic:  Normal speech and language. No gross focal neurologic deficits  Skin:  Skin is warm, dry and intact.  Psychiatric: Mood and affect are normal.   ____________________________________________    EKG  EKG reviewed and interpreted by myself shows normal sinus rhythm at 81 bpm, narrow QRS, left axis deviation, large within normal intervals with nonspecific ST changes.  ____________________________________________    RADIOLOGY  Chest x-ray shows left basilar atelectasis  ____________________________________________   INITIAL IMPRESSION / ASSESSMENT AND PLAN / ED COURSE  Pertinent labs & imaging results that were  available during my care of the patient were reviewed by me and considered in my medical decision making (see chart for details).  Patient presents the emergency department 5 days of epigastric discomfort, central chest discomfort. Patient's labs show a creatinine of 6.1, patient has a history of chronic kidney disease, states he is being followed at [facility] they have discussed dialysis but the patient is holding off as long as possible. Patient's labs also show an elevated troponin 0.05 this is largely unchanged from the patient's baseline given his history of chronic kidney disease. Patient has a slight lipase elevation at 54, LFTs are otherwise within normal limits.  Patient states his pain has completely resolved. Patient's labs are largely at his baseline besides a slight elevation of the lipase at 54. I discussed with the patient diet recommendations, avoiding fatty/greasy foods, taking pain medication as needed, following up with his primary care doctor. Patient  is agreeable. I discussed strict return precautions for any worsening pain chest pain or trouble breathing. ____________________________________________   FINAL CLINICAL IMPRESSION(S) / ED DIAGNOSES  Epigastric pain Chest pain    [name], MD 09/01/16 1436

## 2016-09-01 NOTE — ED Notes (Signed)
Dr. Joni Fears notified of elevated troponin, told to room patient.

## 2016-09-01 NOTE — ED Triage Notes (Signed)
Patient is non-english speaking and unsure of chief complaint.

## 2016-10-08 DIAGNOSIS — M79672 Pain in left foot: Secondary | ICD-10-CM | POA: Insufficient documentation

## 2016-10-08 DIAGNOSIS — M179 Osteoarthritis of knee, unspecified: Secondary | ICD-10-CM | POA: Insufficient documentation

## 2016-10-08 DIAGNOSIS — M1710 Unilateral primary osteoarthritis, unspecified knee: Secondary | ICD-10-CM | POA: Insufficient documentation

## 2016-10-08 DIAGNOSIS — M79673 Pain in unspecified foot: Secondary | ICD-10-CM | POA: Insufficient documentation

## 2017-01-30 ENCOUNTER — Emergency Department: Payer: Medicare Other

## 2017-01-30 ENCOUNTER — Encounter: Payer: Self-pay | Admitting: *Deleted

## 2017-01-30 ENCOUNTER — Inpatient Hospital Stay
Admission: EM | Admit: 2017-01-30 | Discharge: 2017-02-02 | DRG: 872 | Disposition: A | Discharge status: Home Health | Payer: Medicare Other | Attending: Internal Medicine | Admitting: Internal Medicine

## 2017-01-30 DIAGNOSIS — G8929 Other chronic pain: Secondary | ICD-10-CM | POA: Diagnosis present

## 2017-01-30 DIAGNOSIS — R509 Fever, unspecified: Secondary | ICD-10-CM | POA: Diagnosis not present

## 2017-01-30 DIAGNOSIS — R110 Nausea: Secondary | ICD-10-CM

## 2017-01-30 DIAGNOSIS — Z7982 Long term (current) use of aspirin: Secondary | ICD-10-CM

## 2017-01-30 DIAGNOSIS — R531 Weakness: Secondary | ICD-10-CM

## 2017-01-30 DIAGNOSIS — R0789 Other chest pain: Secondary | ICD-10-CM

## 2017-01-30 DIAGNOSIS — I129 Hypertensive chronic kidney disease with stage 1 through stage 4 chronic kidney disease, or unspecified chronic kidney disease: Secondary | ICD-10-CM | POA: Diagnosis present

## 2017-01-30 DIAGNOSIS — N390 Urinary tract infection, site not specified: Secondary | ICD-10-CM | POA: Diagnosis present

## 2017-01-30 DIAGNOSIS — R609 Edema, unspecified: Secondary | ICD-10-CM

## 2017-01-30 DIAGNOSIS — E1140 Type 2 diabetes mellitus with diabetic neuropathy, unspecified: Secondary | ICD-10-CM | POA: Diagnosis present

## 2017-01-30 DIAGNOSIS — M549 Dorsalgia, unspecified: Secondary | ICD-10-CM

## 2017-01-30 DIAGNOSIS — Z7984 Long term (current) use of oral hypoglycemic drugs: Secondary | ICD-10-CM

## 2017-01-30 DIAGNOSIS — A419 Sepsis, unspecified organism: Secondary | ICD-10-CM | POA: Diagnosis present

## 2017-01-30 DIAGNOSIS — B9620 Unspecified Escherichia coli [E. coli] as the cause of diseases classified elsewhere: Secondary | ICD-10-CM | POA: Diagnosis present

## 2017-01-30 DIAGNOSIS — E785 Hyperlipidemia, unspecified: Secondary | ICD-10-CM | POA: Diagnosis present

## 2017-01-30 DIAGNOSIS — D638 Anemia in other chronic diseases classified elsewhere: Secondary | ICD-10-CM | POA: Diagnosis present

## 2017-01-30 DIAGNOSIS — W19XXXA Unspecified fall, initial encounter: Secondary | ICD-10-CM

## 2017-01-30 DIAGNOSIS — D72829 Elevated white blood cell count, unspecified: Secondary | ICD-10-CM

## 2017-01-30 DIAGNOSIS — N189 Chronic kidney disease, unspecified: Secondary | ICD-10-CM

## 2017-01-30 DIAGNOSIS — E872 Acidosis, unspecified: Secondary | ICD-10-CM

## 2017-01-30 DIAGNOSIS — Z79899 Other long term (current) drug therapy: Secondary | ICD-10-CM

## 2017-01-30 DIAGNOSIS — E860 Dehydration: Secondary | ICD-10-CM | POA: Diagnosis present

## 2017-01-30 DIAGNOSIS — N184 Chronic kidney disease, stage 4 (severe): Secondary | ICD-10-CM | POA: Diagnosis present

## 2017-01-30 DIAGNOSIS — A498 Other bacterial infections of unspecified site: Secondary | ICD-10-CM

## 2017-01-30 DIAGNOSIS — R112 Nausea with vomiting, unspecified: Secondary | ICD-10-CM

## 2017-01-30 DIAGNOSIS — R1032 Left lower quadrant pain: Secondary | ICD-10-CM

## 2017-01-30 DIAGNOSIS — Z23 Encounter for immunization: Secondary | ICD-10-CM

## 2017-01-30 DIAGNOSIS — R079 Chest pain, unspecified: Secondary | ICD-10-CM

## 2017-01-30 DIAGNOSIS — H10022 Other mucopurulent conjunctivitis, left eye: Secondary | ICD-10-CM

## 2017-01-30 DIAGNOSIS — E1122 Type 2 diabetes mellitus with diabetic chronic kidney disease: Secondary | ICD-10-CM | POA: Diagnosis present

## 2017-01-30 DIAGNOSIS — M545 Low back pain: Secondary | ICD-10-CM | POA: Diagnosis present

## 2017-01-30 LAB — COMPREHENSIVE METABOLIC PANEL
ALBUMIN: 2.9 g/dL — AB (ref 3.5–5.0)
ALK PHOS: 99 U/L (ref 38–126)
ALT: 13 U/L — ABNORMAL LOW (ref 17–63)
AST: 22 U/L (ref 15–41)
Anion gap: 9 (ref 5–15)
BILIRUBIN TOTAL: 0.9 mg/dL (ref 0.3–1.2)
BUN: 76 mg/dL — AB (ref 6–20)
CALCIUM: 8.4 mg/dL — AB (ref 8.9–10.3)
CO2: 21 mmol/L — ABNORMAL LOW (ref 22–32)
CREATININE: 6.34 mg/dL — AB (ref 0.61–1.24)
Chloride: 107 mmol/L (ref 101–111)
GFR calc Af Amer: 9 mL/min — ABNORMAL LOW (ref >60)
GFR calc non Af Amer: 8 mL/min — ABNORMAL LOW (ref >60)
GLUCOSE: 168 mg/dL — AB (ref 65–99)
Potassium: 4.2 mmol/L (ref 3.5–5.1)
Sodium: 137 mmol/L (ref 135–145)
TOTAL PROTEIN: 6.7 g/dL (ref 6.5–8.1)

## 2017-01-30 LAB — URINALYSIS, COMPLETE (UACMP) WITH MICROSCOPIC
Bilirubin Urine: NEGATIVE
Glucose, UA: 50 mg/dL — AB
Hgb urine dipstick: NEGATIVE
Ketones, ur: NEGATIVE mg/dL
NITRITE: NEGATIVE
Protein, ur: >=300 mg/dL — AB
SPECIFIC GRAVITY, URINE: 1.01 (ref 1.005–1.030)
pH: 6 (ref 5.0–8.0)

## 2017-01-30 LAB — CBC WITH DIFFERENTIAL/PLATELET
BASOS PCT: 0 %
Basophils Absolute: 0 10*3/uL (ref 0–0.1)
EOS ABS: 0 10*3/uL (ref 0–0.7)
EOS PCT: 0 %
HEMATOCRIT: 26.5 % — AB (ref 40.0–52.0)
Hemoglobin: 8.8 g/dL — ABNORMAL LOW (ref 13.0–18.0)
Lymphocytes Relative: 7 %
Lymphs Abs: 1.5 10*3/uL (ref 1.0–3.6)
MCH: 30.9 pg (ref 26.0–34.0)
MCHC: 33.3 g/dL (ref 32.0–36.0)
MCV: 92.7 fL (ref 80.0–100.0)
MONO ABS: 1 10*3/uL (ref 0.2–1.0)
MONOS PCT: 5 %
NEUTROS ABS: 19.2 10*3/uL — AB (ref 1.4–6.5)
Neutrophils Relative %: 88 %
Platelets: 211 10*3/uL (ref 150–440)
RBC: 2.86 MIL/uL — ABNORMAL LOW (ref 4.40–5.90)
RDW: 14.2 % (ref 11.5–14.5)
WBC: 21.7 10*3/uL — ABNORMAL HIGH (ref 3.8–10.6)

## 2017-01-30 LAB — INFLUENZA PANEL BY PCR (TYPE A & B)
INFLAPCR: NEGATIVE
INFLBPCR: NEGATIVE

## 2017-01-30 LAB — LACTIC ACID, PLASMA: Lactic Acid, Venous: 0.9 mmol/L (ref 0.5–1.9)

## 2017-01-30 MED ORDER — SODIUM CHLORIDE 0.9 % IV SOLN
Freq: Once | INTRAVENOUS | Status: AC
  Administered 2017-01-30: 1000 mL via INTRAVENOUS

## 2017-01-30 MED ORDER — SODIUM CHLORIDE 0.9 % IV BOLUS (SEPSIS): 1000.0000 mL | Freq: Once | INTRAVENOUS | Status: DC

## 2017-01-30 MED ORDER — VANCOMYCIN HCL IN DEXTROSE 1-5 GM/200ML-% IV SOLN
1000.0000 mg | Freq: Once | INTRAVENOUS | Status: AC
  Administered 2017-01-30: 1000 mg via INTRAVENOUS
  Filled 2017-01-30: qty 200

## 2017-01-30 MED ORDER — PIPERACILLIN-TAZOBACTAM 3.375 G IVPB
3.3750 g | Freq: Once | INTRAVENOUS | Status: AC
  Administered 2017-01-30: 3.375 g via INTRAVENOUS
  Filled 2017-01-30: qty 50

## 2017-01-30 NOTE — ED Triage Notes (Signed)
Daughter states pt has a fever, weakness and pain in both legs.  Pt states he fell getting out of the bed today.  Pt struck head on the floor.  No vomiting.  No loc.  Pt has a red mark on left side of face.

## 2017-01-30 NOTE — ED Provider Notes (Signed)
[facility] Emergency Department Provider Note        Time seen: ----------------------------------------- 9:04 PM on 01/30/2017 -----------------------------------------    I have reviewed the triage vital signs and the nursing notes.   HISTORY  Chief Complaint Fever and Code Sepsis    HPI Martin Gilbert is a 76 y.o. male route to the ER by his family for fever and diffuse myalgias. Patient symptoms have been present for the past several days. Nothing makes symptoms better or worse. Reportedly he is still making urine, he has chronic kidney disease. He has not had chest pain, shortness of breath, vomiting or diarrhea. Patient is taking all of his medications as prescribed.   Past Medical History:  Diagnosis Date  . Back pain   . BPH (benign prostatic hyperplasia)   . CKD (chronic kidney disease), stage IV ([place])   . Diabetes mellitus without complication ([place])   . HTN (hypertension)   . Hyperlipidemia     Patient Active Problem List   Diagnosis Date Noted  . Essential hypertension 11/28/2015  . Pain in the chest 02/15/2015  . Neck pain 02/15/2015  . Diabetes type 2, uncontrolled ([place]) 02/15/2015  . Chronic kidney disease, stage IV (severe) ([place]) 02/15/2015  . Frequent headaches 02/15/2015    Past Surgical History:  Procedure Laterality Date  . hernia repair      Allergies Acetaminophen and Aspirin  Social History Social History  Substance Use Topics  . Smoking status: Never Smoker  . Smokeless tobacco: Never Used  . Alcohol use No     Comment: Drank in the past, but has stopped for several years.     Review of Systems Constitutional: Positive for fevers and chills Cardiovascular: Negative for chest pain. Respiratory: Negative for shortness of breath. Gastrointestinal: Negative for abdominal pain, vomiting and diarrhea. Genitourinary: Negative for dysuria. Musculoskeletal: Positive for diffuse muscle aches Skin:  Negative for rash. Neurological: Negative for headaches, positive for weakness  10-point ROS otherwise negative.  ____________________________________________   PHYSICAL EXAM:  VITAL SIGNS: ED Triage Vitals  Enc Vitals Group     BP 01/30/17 2059 (!) 158/60     Pulse Rate 01/30/17 2059 (!) 112     Resp 01/30/17 2059 20     Temp 01/30/17 2059 (!) 102.2 F (39 C)     Temp Source 01/30/17 2059 Oral     SpO2 01/30/17 2059 95 %     Weight 01/30/17 2101 180 lb (81.6 kg)     Height 01/30/17 2101 5\' 7"  (1.702 m)     Head Circumference --      Peak Flow --      Pain Score 01/30/17 2102 10     Pain Loc --      Pain Edu? --      Excl. in [place]? --    Constitutional: Alert and oriented. No distress Eyes: Conjunctivae are normal.  Normal extraocular movements. ENT   Head: Normocephalic and atraumatic.   Nose: No congestion/rhinnorhea.   Mouth/Throat: Mucous membranes are moist.   Neck: No stridor. Cardiovascular: Rapid rate, regular rhythm. No murmurs, rubs, or gallops. Respiratory: Normal respiratory effort without tachypnea nor retractions. Breath sounds are clear and equal bilaterally. No wheezes/rales/rhonchi. Gastrointestinal: Soft and nontender. Normal bowel sounds, No lower extremity tenderness nor edema. Musculoskeletal: Nontender with normal range of motion in all extremities.  Neurologic: No gross focal neurologic deficits are appreciated. Generalized weakness, nothing focal Skin:  Skin is warm, dry and intact. No rash noted.  Psychiatric: Mood and affect are normal. Speech and behavior are normal.  ____________________________________________  EKG: Interpreted by me.Sinus tachycardia with a rate of 106 bpm, right bundle branch block, left anterior fascicular block. Long QT  ____________________________________________  ED COURSE:  Pertinent labs & imaging results that were available during my care of the patient were reviewed by me and considered in my medical  decision making (see chart for details). Patient presents to the ER with febrile illness of uncertain etiology. We will assess with labs and imaging. We will initiate sepsis protocol possible influenza.   Procedures ____________________________________________   LABS (pertinent positives/negatives)  Labs Reviewed  COMPREHENSIVE METABOLIC PANEL - Abnormal; Notable for the following:       Result Value   CO2 21 (*)    Glucose, Bld 168 (*)    BUN 76 (*)    Creatinine, Ser 6.34 (*)    Calcium 8.4 (*)    Albumin 2.9 (*)    ALT 13 (*)    GFR calc non Af Amer 8 (*)    GFR calc Af Amer 9 (*)    All other components within normal limits  CBC WITH DIFFERENTIAL/PLATELET - Abnormal; Notable for the following:    WBC 21.7 (*)    RBC 2.86 (*)    Hemoglobin 8.8 (*)    HCT 26.5 (*)    Neutro Abs 19.2 (*)    All other components within normal limits  URINALYSIS, COMPLETE (UACMP) WITH MICROSCOPIC - Abnormal; Notable for the following:    Color, Urine YELLOW (*)    APPearance CLOUDY (*)    Glucose, UA 50 (*)    Protein, ur >=300 (*)    Leukocytes, UA LARGE (*)    Bacteria, UA RARE (*)    Squamous Epithelial / LPF 0-5 (*)    All other components within normal limits  CULTURE, BLOOD (ROUTINE X 2)  CULTURE, BLOOD (ROUTINE X 2)  LACTIC ACID, PLASMA  INFLUENZA PANEL BY PCR (TYPE A & B)  LACTIC ACID, PLASMA   CRITICAL CARE Performed by: Earleen Newport   Total critical care time: 30 minutes  Critical care time was exclusive of separately billable procedures and treating other patients.  Critical care was necessary to treat or prevent imminent or life-threatening deterioration.  Critical care was time spent personally by me on the following activities: development of treatment plan with patient and/or surrogate as well as nursing, discussions with consultants, evaluation of patient's response to treatment, examination of patient, obtaining history from patient or surrogate, ordering  and performing treatments and interventions, ordering and review of laboratory studies, ordering and review of radiographic studies, pulse oximetry and re-evaluation of patient's condition.  RADIOLOGY Images were viewed by me  Chest x-ray IMPRESSION: Shallow inspiration with slight linear atelectasis in the left mid lung. No evidence of active pulmonary disease. ____________________________________________  FINAL ASSESSMENT AND PLAN  Urosepsis  Plan: Patient with labs and imaging as dictated above. Patient presented with service criteria and has apparent UTI. He has been started on broad-spectrum antibiotics including vancomycin and Zosyn. Cultures have been sent. 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 I will discuss with the hospitalist for admission.   Earleen Newport, MD   Note: This note was generated in part or whole with voice recognition software. Voice recognition is usually quite accurate but there are transcription errors that can and very often do occur. I apologize for any typographical errors that were not  detected and corrected.     Earleen Newport, MD 01/30/17 2256

## 2017-01-30 NOTE — ED Notes (Signed)
Patient transported to X-ray 

## 2017-01-31 ENCOUNTER — Inpatient Hospital Stay: Payer: Medicare Other

## 2017-01-31 ENCOUNTER — Encounter: Payer: Self-pay | Admitting: Internal Medicine

## 2017-01-31 DIAGNOSIS — Z7982 Long term (current) use of aspirin: Secondary | ICD-10-CM | POA: Diagnosis not present

## 2017-01-31 DIAGNOSIS — D638 Anemia in other chronic diseases classified elsewhere: Secondary | ICD-10-CM | POA: Diagnosis present

## 2017-01-31 DIAGNOSIS — Z23 Encounter for immunization: Secondary | ICD-10-CM | POA: Diagnosis not present

## 2017-01-31 DIAGNOSIS — E1122 Type 2 diabetes mellitus with diabetic chronic kidney disease: Secondary | ICD-10-CM | POA: Diagnosis present

## 2017-01-31 DIAGNOSIS — Z79899 Other long term (current) drug therapy: Secondary | ICD-10-CM | POA: Diagnosis not present

## 2017-01-31 DIAGNOSIS — I129 Hypertensive chronic kidney disease with stage 1 through stage 4 chronic kidney disease, or unspecified chronic kidney disease: Secondary | ICD-10-CM | POA: Diagnosis present

## 2017-01-31 DIAGNOSIS — B9620 Unspecified Escherichia coli [E. coli] as the cause of diseases classified elsewhere: Secondary | ICD-10-CM | POA: Diagnosis present

## 2017-01-31 DIAGNOSIS — E1140 Type 2 diabetes mellitus with diabetic neuropathy, unspecified: Secondary | ICD-10-CM | POA: Diagnosis present

## 2017-01-31 DIAGNOSIS — G8929 Other chronic pain: Secondary | ICD-10-CM | POA: Diagnosis present

## 2017-01-31 DIAGNOSIS — E872 Acidosis: Secondary | ICD-10-CM | POA: Diagnosis present

## 2017-01-31 DIAGNOSIS — E785 Hyperlipidemia, unspecified: Secondary | ICD-10-CM | POA: Diagnosis present

## 2017-01-31 DIAGNOSIS — E860 Dehydration: Secondary | ICD-10-CM | POA: Diagnosis present

## 2017-01-31 DIAGNOSIS — M545 Low back pain: Secondary | ICD-10-CM | POA: Diagnosis present

## 2017-01-31 DIAGNOSIS — A419 Sepsis, unspecified organism: Secondary | ICD-10-CM | POA: Diagnosis present

## 2017-01-31 DIAGNOSIS — R509 Fever, unspecified: Secondary | ICD-10-CM | POA: Diagnosis present

## 2017-01-31 DIAGNOSIS — N390 Urinary tract infection, site not specified: Secondary | ICD-10-CM | POA: Diagnosis present

## 2017-01-31 DIAGNOSIS — Z7984 Long term (current) use of oral hypoglycemic drugs: Secondary | ICD-10-CM | POA: Diagnosis not present

## 2017-01-31 DIAGNOSIS — H10022 Other mucopurulent conjunctivitis, left eye: Secondary | ICD-10-CM | POA: Diagnosis not present

## 2017-01-31 DIAGNOSIS — N184 Chronic kidney disease, stage 4 (severe): Secondary | ICD-10-CM | POA: Diagnosis present

## 2017-01-31 LAB — BASIC METABOLIC PANEL
ANION GAP: 9 (ref 5–15)
BUN: 72 mg/dL — AB (ref 6–20)
CHLORIDE: 110 mmol/L (ref 101–111)
CO2: 21 mmol/L — ABNORMAL LOW (ref 22–32)
Calcium: 7.8 mg/dL — ABNORMAL LOW (ref 8.9–10.3)
Creatinine, Ser: 6 mg/dL — ABNORMAL HIGH (ref 0.61–1.24)
GFR calc Af Amer: 9 mL/min — ABNORMAL LOW (ref >60)
GFR calc non Af Amer: 8 mL/min — ABNORMAL LOW (ref >60)
GLUCOSE: 131 mg/dL — AB (ref 65–99)
POTASSIUM: 4 mmol/L (ref 3.5–5.1)
Sodium: 140 mmol/L (ref 135–145)

## 2017-01-31 LAB — GLUCOSE, CAPILLARY
GLUCOSE-CAPILLARY: 128 mg/dL — AB (ref 65–99)
Glucose-Capillary: 157 mg/dL — ABNORMAL HIGH (ref 65–99)
Glucose-Capillary: 114 mg/dL — ABNORMAL HIGH (ref 65–99)
Glucose-Capillary: 166 mg/dL — ABNORMAL HIGH (ref 65–99)

## 2017-01-31 LAB — CBC
HEMATOCRIT: 24.2 % — AB (ref 40.0–52.0)
HEMOGLOBIN: 8 g/dL — AB (ref 13.0–18.0)
MCH: 31 pg (ref 26.0–34.0)
MCHC: 33.1 g/dL (ref 32.0–36.0)
MCV: 93.8 fL (ref 80.0–100.0)
Platelets: 180 10*3/uL (ref 150–440)
RBC: 2.58 MIL/uL — ABNORMAL LOW (ref 4.40–5.90)
RDW: 14 % (ref 11.5–14.5)
WBC: 25.9 10*3/uL — AB (ref 3.8–10.6)

## 2017-01-31 MED ORDER — HEPARIN SODIUM (PORCINE) 5000 UNIT/ML IJ SOLN
5000.0000 [IU] | Freq: Three times a day (TID) | INTRAMUSCULAR | Status: DC
  Administered 2017-01-31 – 2017-02-02 (×7): 5000 [IU] via SUBCUTANEOUS
  Filled 2017-01-31 (×7): qty 1

## 2017-01-31 MED ORDER — ONDANSETRON HCL 4 MG/2ML IJ SOLN
4.0000 mg | Freq: Four times a day (QID) | INTRAMUSCULAR | Status: DC | PRN
  Administered 2017-01-31 – 2017-02-02 (×7): 4 mg via INTRAVENOUS
  Filled 2017-01-31 (×7): qty 2

## 2017-01-31 MED ORDER — INSULIN ASPART 100 UNIT/ML ~~LOC~~ SOLN
0.0000 [IU] | Freq: Three times a day (TID) | SUBCUTANEOUS | Status: DC
  Administered 2017-01-31: 3 [IU] via SUBCUTANEOUS
  Administered 2017-01-31 – 2017-02-02 (×2): 2 [IU] via SUBCUTANEOUS
  Filled 2017-01-31: qty 2
  Filled 2017-01-31: qty 3
  Filled 2017-01-31: qty 2

## 2017-01-31 MED ORDER — PNEUMOCOCCAL VAC POLYVALENT 25 MCG/0.5ML IJ INJ: 0.5000 mL | INJECTION | INTRAMUSCULAR | Status: DC

## 2017-01-31 MED ORDER — SODIUM CHLORIDE 0.9 % IV BOLUS (SEPSIS)
1000.0000 mL | Freq: Once | INTRAVENOUS | Status: AC
  Administered 2017-01-31: 1000 mL via INTRAVENOUS

## 2017-01-31 MED ORDER — ACETAMINOPHEN 325 MG PO TABS: 650.0000 mg | ORAL_TABLET | Freq: Four times a day (QID) | ORAL | Status: DC | PRN

## 2017-01-31 MED ORDER — DEXTROSE 5 % IV SOLN
2.0000 g | Freq: Once | INTRAVENOUS | Status: AC
  Administered 2017-01-31: 2 g via INTRAVENOUS
  Filled 2017-01-31: qty 2

## 2017-01-31 MED ORDER — SODIUM CHLORIDE 0.9% FLUSH
3.0000 mL | Freq: Two times a day (BID) | INTRAVENOUS | Status: DC
  Administered 2017-01-31 – 2017-02-02 (×6): 3 mL via INTRAVENOUS

## 2017-01-31 MED ORDER — MORPHINE SULFATE (PF) 2 MG/ML IV SOLN
INTRAVENOUS | Status: AC
  Administered 2017-01-31: 2 mg via INTRAVENOUS
  Filled 2017-01-31: qty 1

## 2017-01-31 MED ORDER — ATORVASTATIN CALCIUM 20 MG PO TABS
20.0000 mg | ORAL_TABLET | Freq: Every day | ORAL | Status: DC
  Administered 2017-01-31 – 2017-02-02 (×3): 20 mg via ORAL
  Filled 2017-01-31 (×3): qty 1

## 2017-01-31 MED ORDER — INFLUENZA VAC SPLIT QUAD 0.5 ML IM SUSY: 0.5000 mL | PREFILLED_SYRINGE | INTRAMUSCULAR | Status: DC

## 2017-01-31 MED ORDER — SENNOSIDES-DOCUSATE SODIUM 8.6-50 MG PO TABS
1.0000 | ORAL_TABLET | Freq: Every evening | ORAL | Status: DC | PRN
  Administered 2017-02-02: 1 via ORAL
  Filled 2017-01-31: qty 1

## 2017-01-31 MED ORDER — OXYCODONE HCL 5 MG PO TABS
5.0000 mg | ORAL_TABLET | ORAL | Status: DC | PRN
  Administered 2017-01-31 – 2017-02-02 (×6): 5 mg via ORAL
  Filled 2017-01-31 (×6): qty 1

## 2017-01-31 MED ORDER — MORPHINE SULFATE (PF) 2 MG/ML IV SOLN
2.0000 mg | Freq: Once | INTRAVENOUS | Status: AC
  Administered 2017-01-31: 2 mg via INTRAVENOUS

## 2017-01-31 MED ORDER — ONDANSETRON HCL 4 MG PO TABS: 4.0000 mg | ORAL_TABLET | Freq: Four times a day (QID) | ORAL | Status: DC | PRN

## 2017-01-31 MED ORDER — DEXTROSE 5 % IV SOLN
1.0000 g | INTRAVENOUS | Status: DC
  Administered 2017-01-31 – 2017-02-01 (×2): 1 g via INTRAVENOUS
  Filled 2017-01-31 (×3): qty 10

## 2017-01-31 MED ORDER — SODIUM BICARBONATE 650 MG PO TABS
650.0000 mg | ORAL_TABLET | Freq: Two times a day (BID) | ORAL | Status: DC
  Administered 2017-01-31 – 2017-02-02 (×6): 650 mg via ORAL
  Filled 2017-01-31 (×6): qty 1

## 2017-01-31 MED ORDER — SODIUM CHLORIDE 0.9 % IV SOLN
INTRAVENOUS | Status: DC
  Administered 2017-01-31 – 2017-02-01 (×4): via INTRAVENOUS

## 2017-01-31 MED ORDER — ENOXAPARIN SODIUM 40 MG/0.4ML ~~LOC~~ SOLN: 40.0000 mg | SUBCUTANEOUS | Status: DC

## 2017-01-31 MED ORDER — INSULIN ASPART 100 UNIT/ML ~~LOC~~ SOLN: 0.0000 [IU] | Freq: Every day | SUBCUTANEOUS | Status: DC

## 2017-01-31 MED ORDER — DOXAZOSIN MESYLATE 4 MG PO TABS
2.0000 mg | ORAL_TABLET | Freq: Every day | ORAL | Status: DC
  Administered 2017-01-31 – 2017-02-02 (×3): 2 mg via ORAL
  Filled 2017-01-31 (×3): qty 1

## 2017-01-31 MED ORDER — CEFTRIAXONE SODIUM 2 G IJ SOLR
2.0000 g | Freq: Once | INTRAMUSCULAR | Status: DC
Start: 2017-01-31 — End: 2017-01-31

## 2017-01-31 NOTE — Progress Notes (Signed)
Pharmacy Antibiotic Note  Martin Gilbert is a 76 y.o. male admitted on 01/30/2017 with UTI.  Pharmacy has been consulted for ceftriaxone dosing.  Plan: Ceftriaxone 1 gm IV Q24H  Height: 5\' 7"  (170.2 cm) Weight: 176 lb 14.4 oz (80.2 kg) IBW/kg (Calculated) : 66.1  Temp (24hrs), Avg:101.1 F (38.4 C), Min:99.9 F (37.7 C), Max:102.2 F (39 C)   Recent Labs Lab 01/30/17 2112  WBC 21.7*  CREATININE 6.34*  LATICACIDVEN 0.9    Estimated Creatinine Clearance: 10.1 mL/min (by C-G formula based on SCr of 6.34 mg/dL (H)).    Allergies  Allergen Reactions  . Acetaminophen Hives    Possible lip swelling.  Patient is not sure.  09/01/16- patient denies allergy  . Aspirin Rash    09/01/16- patient denies allergy   Thank you for allowing pharmacy to be a part of this patient's care.  Laural Benes, Pharm.D., BCPS Clinical Pharmacist 01/31/2017 3:50 AM

## 2017-01-31 NOTE — Care Management (Signed)
When CM was available to assess, patient's daughter was not present and was informed that [place] do not speak with patient's language.  Daughter has to translate

## 2017-01-31 NOTE — Progress Notes (Signed)
[facility]                                                                                                                                                                                  Patient Demographics   Martin Gilbert, is a 76 y.o. male, DOB - 08-Oct-1941, PPI:[id number]  Admit date - 01/30/2017   Admitting Physician Saundra Shelling, MD  Outpatient Primary MD for the patient is [name], MD   LOS - 0  Subjective: Patient admitted with sepsis and a UTI. He speaks in a tribal language so his daughter was used as a Optometrist. Patient still has nausea and vomiting but otherwise denies any chest pain or palpitations    Review of Systems:   CONSTITUTIONAL: No documented fever. No fatigue,Significant weakness weakness. No weight gain, no weight loss.  EYES: No blurry or double vision.  ENT: No tinnitus. No postnasal drip. No redness of the oropharynx.  RESPIRATORY: No cough, no wheeze, no hemoptysis. No dyspnea.  CARDIOVASCULAR: No chest pain. No orthopnea. No palpitations. No syncope.  GASTROINTESTINAL: Positive nausea, as it if vomiting or diarrhea. No abdominal pain. No melena or hematochezia.  GENITOURINARY: No dysuria or hematuria.  ENDOCRINE: No polyuria or nocturia. No heat or cold intolerance.  HEMATOLOGY: No anemia. No bruising. No bleeding.  INTEGUMENTARY: No rashes. No lesions.  MUSCULOSKELETAL: No arthritis. No swelling. No gout.  NEUROLOGIC: No numbness, tingling, or ataxia. No seizure-type activity.  PSYCHIATRIC: No anxiety. No insomnia. No ADD.    Vitals:   Vitals:   01/31/17 0550 01/31/17 0647 01/31/17 0839 01/31/17 1226  BP:  (!) 132/49 127/77 (!) 133/56  Pulse:  81 87 75  Resp:   18 18  Temp: 99.1 F (37.3 C) 98.8 F (37.1 C) 98.3 F (36.8 C) 98.3 F (36.8 C)  TempSrc: Oral Oral Oral Oral  SpO2:  100% 96% 93%  Weight:      Height:        Wt Readings from Last 3 Encounters:  01/31/17 176 lb  14.4 oz (80.2 kg)  09/01/16 180 lb (81.6 kg)  07/30/16 180 lb (81.6 kg)     Intake/Output Summary (Last 24 hours) at 01/31/17 1623 Last data filed at 01/31/17 0158  Gross per 24 hour  Intake             2250 ml  Output              100 ml  Net             2150 ml    Physical Exam:   GENERAL: Pleasant-appearing in no apparent distress.  HEAD, EYES,  EARS, NOSE AND THROAT: Atraumatic, normocephalic. Extraocular muscles are intact. Pupils equal and reactive to light. Sclerae anicteric. No conjunctival injection. No oro-pharyngeal erythema.  NECK: Supple. There is no jugular venous distention. No bruits, no lymphadenopathy, no thyromegaly.  HEART: Regular rate and rhythm,. No murmurs, no rubs, no clicks.  LUNGS: Clear to auscultation bilaterally. No rales or rhonchi. No wheezes.  ABDOMEN: Soft, flat, nontender, nondistended. Has good bowel sounds. No hepatosplenomegaly appreciated.  EXTREMITIES: No evidence of any cyanosis, clubbing, or peripheral edema.  +2 pedal and radial pulses bilaterally.  NEUROLOGIC: The patient is alert, awake, and oriented x3 with no focal motor or sensory deficits appreciated bilaterally.  SKIN: Moist and warm with no rashes appreciated.  Psych: Not anxious, depressed LN: No inguinal LN enlargement    Antibiotics   Anti-infectives    Start     Dose/Rate Route Frequency Ordered Stop   01/31/17 2200  cefTRIAXone (ROCEPHIN) 1 g in dextrose 5 % 50 mL IVPB     1 g 100 mL/hr over 30 Minutes Intravenous Every 24 hours 01/31/17 0349     01/31/17 0345  cefTRIAXone (ROCEPHIN) 2 g in dextrose 5 % 50 mL IVPB  Status:  Discontinued     2 g 100 mL/hr over 30 Minutes Intravenous  Once 01/31/17 0337 01/31/17 0339   01/31/17 0345  cefTRIAXone (ROCEPHIN) 2 g in dextrose 5 % 50 mL IVPB     2 g 100 mL/hr over 30 Minutes Intravenous  Once 01/31/17 0339 01/31/17 0428   01/30/17 2145  vancomycin (VANCOCIN) IVPB 1000 mg/200 mL premix     1,000 mg 200 mL/hr over 60 Minutes  Intravenous  Once 01/30/17 2137 01/30/17 2255   01/30/17 2145  piperacillin-tazobactam (ZOSYN) IVPB 3.375 g     3.375 g 12.5 mL/hr over 240 Minutes Intravenous  Once 01/30/17 2137 01/31/17 0155      Medications   Scheduled Meds: . atorvastatin  20 mg Oral Daily  . cefTRIAXone (ROCEPHIN) IVPB 1 gram/50 mL D5W  1 g Intravenous Q24H  . doxazosin  2 mg Oral Daily  . heparin  5,000 Units Subcutaneous Q8H  . [START ON 02/01/2017] Influenza vac split quadrivalent PF  0.5 mL Intramuscular Tomorrow-1000  . insulin aspart  0-15 Units Subcutaneous TID WC  . insulin aspart  0-5 Units Subcutaneous QHS  . [START ON 02/01/2017] pneumococcal 23 valent vaccine  0.5 mL Intramuscular Tomorrow-1000  . sodium bicarbonate  650 mg Oral BID  . sodium chloride flush  3 mL Intravenous Q12H   Continuous Infusions: . sodium chloride 125 mL/hr at 01/31/17 1217   PRN Meds:.acetaminophen, ondansetron **OR** ondansetron (ZOFRAN) IV, oxyCODONE, senna-docusate   Data Review:   Micro Results Recent Results (from the past 240 hour(s))  Culture, blood (Routine x 2)     Status: None (Preliminary result)   Collection Time: 01/30/17  9:12 PM  Result Value Ref Range Status   Specimen Description BLOOD L HAND  Final   Special Requests BOTTLES DRAWN AEROBIC AND ANAEROBIC BCAV  Final   Culture NO GROWTH < 12 HOURS  Final   Report Status PENDING  Incomplete  Culture, blood (Routine x 2)     Status: None (Preliminary result)   Collection Time: 01/30/17  9:12 PM  Result Value Ref Range Status   Specimen Description BLOOD L FOREARM  Final   Special Requests BOTTLES DRAWN AEROBIC AND ANAEROBIC BCLV  Final   Culture NO GROWTH < 12 HOURS  Final   Report Status PENDING  Incomplete    Radiology Reports Dg Chest 2 View  Result Date: 01/30/2017 CLINICAL DATA:  Fever, weakness, and pain in both legs since the morning. [place] getting out of bed today. EXAM: CHEST  2 VIEW COMPARISON:  09/01/2016 FINDINGS: Shallow inspiration.  Slight linear atelectasis in the left mid lung. No focal airspace disease or consolidation. No blunting of costophrenic angles. No pneumothorax. Mediastinal contours appear intact. Normal heart size and pulmonary vascularity. Degenerative changes in the spine. IMPRESSION: Shallow inspiration with slight linear atelectasis in the left mid lung. No evidence of active pulmonary disease. Electronically Signed   By: Lucienne Capers M.D.   On: 01/30/2017 21:40   Dg Lumbar Spine 2-3 Views  Result Date: 01/31/2017 CLINICAL DATA:  Lower back pain without known injury. EXAM: LUMBAR SPINE - 2-3 VIEW COMPARISON:  None. FINDINGS: No fracture or spondylolisthesis is noted. Mild degenerative disc disease is noted at T12-L1 and L1-2 with anterior osteophyte formation. Remaining disc spaces appear intact. IMPRESSION: Mild degenerative disc disease as described above. No acute abnormality seen in the lumbar spine. Electronically Signed   By: Marijo Conception, M.D.   On: 01/31/2017 10:14   Dg Pelvis 1-2 Views  Result Date: 01/31/2017 CLINICAL DATA:  Pelvic pain without known injury. EXAM: PELVIS - 1-2 VIEW COMPARISON:  None. FINDINGS: There is no evidence of pelvic fracture or diastasis. No pelvic bone lesions are seen. IMPRESSION: Normal pelvis. Electronically Signed   By: Marijo Conception, M.D.   On: 01/31/2017 10:12   US Renal  Result Date: 01/31/2017 CLINICAL DATA:  Chronic kidney disease. EXAM: RENAL / URINARY TRACT ULTRASOUND COMPLETE COMPARISON:  02/12/2016 CT abdomen/ pelvis. FINDINGS: Right Kidney: Length: 10.5 cm. Mildly echogenic right kidney with mild right renal parenchymal atrophy. No right hydronephrosis. No right renal mass. Left Kidney: Length: 11.2 cm. Mildly echogenic left kidney with mild left renal parenchymal atrophy. No left hydronephrosis. Simple appearing 0.8 x 0.8 x 1.0 cm partially exophytic renal cyst in the lower left kidney. Bladder: Appears normal for degree of bladder distention. Mass-effect  on the bladder base by the enlarged prostate, which measures approximately 5.4 x 4.5 x 4.2 cm. IMPRESSION: 1. No hydronephrosis. 2. Mildly echogenic and mildly atrophic kidneys bilaterally, compatible with the provided history of chronic kidney disease. 3. Tiny benign-appearing lower left renal cyst. 4. Normal bladder. 5. Enlarged prostate. Electronically Signed   By: Ilona Sorrel M.D.   On: 01/31/2017 10:02     CBC  Recent Labs Lab 01/30/17 2112 01/31/17 0627  WBC 21.7* 25.9*  HGB 8.8* 8.0*  HCT 26.5* 24.2*  PLT 211 180  MCV 92.7 93.8  MCH 30.9 31.0  MCHC 33.3 33.1  RDW 14.2 14.0  LYMPHSABS 1.5  --   MONOABS 1.0  --   EOSABS 0.0  --   BASOSABS 0.0  --     Chemistries   Recent Labs Lab 01/30/17 2112 01/31/17 0627  NA 137 140  K 4.2 4.0  CL 107 110  CO2 21* 21*  GLUCOSE 168* 131*  BUN 76* 72*  CREATININE 6.34* 6.00*  CALCIUM 8.4* 7.8*  AST 22  --   ALT 13*  --   ALKPHOS 99  --   BILITOT 0.9  --    ------------------------------------------------------------------------------------------------------------------ estimated creatinine clearance is 10.6 mL/min (by C-G formula based on SCr of 6 mg/dL (H)). ------------------------------------------------------------------------------------------------------------------ No results for input(s): HGBA1C in the last 72 hours. ------------------------------------------------------------------------------------------------------------------ No results for input(s): CHOL, HDL, LDLCALC, TRIG, CHOLHDL, LDLDIRECT in the  last 72 hours. ------------------------------------------------------------------------------------------------------------------ No results for input(s): TSH, T4TOTAL, T3FREE, THYROIDAB in the last 72 hours.  Invalid input(s): FREET3 ------------------------------------------------------------------------------------------------------------------ No results for input(s): VITAMINB12, FOLATE, FERRITIN, TIBC, IRON,  RETICCTPCT in the last 72 hours.  Coagulation profile No results for input(s): INR, PROTIME in the last 168 hours.  No results for input(s): DDIMER in the last 72 hours.  Cardiac Enzymes No results for input(s): CKMB, TROPONINI, MYOGLOBIN in the last 168 hours.  Invalid input(s): CK ------------------------------------------------------------------------------------------------------------------ Invalid input(s): [place]  Patient is 76 year old admitted with sepsis due to UTI 1. Sepsis related to urinary tract infection continue therapy with IV ceftriaxone 2. Urinary tract infection all her urine cultures ultrasound of the abdomen reviewed there is no evidence of stone or hydronephrosis 3. Dehydration and 2 new IV fluids 4. Leukocytosis related to #1 follow WBC 5. Anemia of chronic disease 6. Chronic kidney disease stage IV renal function close to baseline if worsens will consider nephrology evaluation 7. Hypertension blood pressure is currently stable 8. Hyperlipidemia continue atorvastatin     Code Status Orders        Start     Ordered   01/31/17 0235  Full code  Continuous     01/31/17 0236    Code Status History    Date Active Date Inactive Code Status Order ID Comments User Context   This patient has a current code status but no historical code status.           Consults   none DVT Prophylaxis  heparin  Lab Results  Component Value Date   PLT 180 01/31/2017     Time Spent in minutes   31min  Greater than 50% of time spent in care coordination and counseling patient regarding the condition and plan of care.   Dustin Flock M.D on 01/31/2017 at 4:23 PM  Between 7am to 6pm - Pager - [phone]  After 6pm go to [email or web address] - password [email or web address] [place] [facility]   Office  [phone]

## 2017-01-31 NOTE — ED Notes (Signed)
Daughter (Angie) contact number [phone]).

## 2017-01-31 NOTE — Progress Notes (Addendum)
Pt's native language is [place] (a [place] dialect) however, pt does speak spanish. Pt has some impaired hearing.

## 2017-01-31 NOTE — Progress Notes (Signed)
Pt has been admitted to the floor, Pt is spanish speaking, on arrival to the floor, pt started vomiting, and C/O nausea, severe pain on the LLQ, MD Pyreddy made aware, a Lumbar and a pelvis x-ray have been ordered as instructed by MD. Pt is alert and oriented, forgetful at times. Will continue to monitor.

## 2017-01-31 NOTE — H&P (Signed)
[facility] [place] NAME: Martin Gilbert    MR#:  [id number]  DATE OF BIRTH:  1941/01/07  DATE OF ADMISSION:  01/30/2017  PRIMARY CARE PHYSICIAN: [name], MD   REQUESTING/REFERRING PHYSICIAN:   CHIEF COMPLAINT:   Chief Complaint  Patient presents with  . Fever  . Code Sepsis    HISTORY OF PRESENT ILLNESS: Martin Gilbert  is a 76 y.o. male with a known history of Stage IV chronic kidney disease, chronic back pain, diabetes. This type II, hypertension, hyperlipidemia presented to the emergency room with fever and confusion. Patient had a fever of 102.62F. He had foul-smelling urine. He speaks Spanish and information obtained with the help of interpreter. Has chills and low back discomfort. Patient also lost balance and fell at home and he has low back pain on the left side. The pain is aching in nature 3 out of 10. He is awake and responds to verbal commands and more alert in the emergency room. Complaints of dysuria and increased frequency of urination. No complaints of any chest pain, shortness of breath. Patient had tachycardia fever in the emergency room and was started on IV fluids based on sepsis protocol and was given IV antibiotics. Hospitalist service was consulted for further care of the patient.  PAST MEDICAL HISTORY:   Past Medical History:  Diagnosis Date  . Back pain   . BPH (benign prostatic hyperplasia)   . CKD (chronic kidney disease), stage IV ([name])   . Diabetes mellitus without complication ([name])   . HTN (hypertension)   . Hyperlipidemia     PAST SURGICAL HISTORY: Past Surgical History:  Procedure Laterality Date  . hernia repair      SOCIAL HISTORY:  Social History  Substance Use Topics  . Smoking status: Never Smoker  . Smokeless tobacco: Never Used  . Alcohol use No     Comment: Drank in the past, but has stopped for several years.     FAMILY HISTORY:  Family History   Problem Relation Age of Onset  . Family history unknown: Yes    DRUG ALLERGIES:  Allergies  Allergen Reactions  . Acetaminophen Hives    Possible lip swelling.  Patient is not sure.  09/01/16- patient denies allergy  . Aspirin Rash    09/01/16- patient denies allergy    REVIEW OF SYSTEMS:   CONSTITUTIONAL: Had fever, fatigue and weakness.  EYES: No blurred or double vision.  EARS, NOSE, AND THROAT: No tinnitus or ear pain.  RESPIRATORY: No cough, shortness of breath, wheezing or hemoptysis.  CARDIOVASCULAR: No chest pain, orthopnea, edema.  GASTROINTESTINAL: No nausea, vomiting, diarrhea or abdominal pain.  GENITOURINARY: Has dysuria, no hematuria.  ENDOCRINE: No polyuria, nocturia,  HEMATOLOGY: No anemia, easy bruising or bleeding SKIN: No rash or lesion. MUSCULOSKELETAL: No joint pain or arthritis.   NEUROLOGIC: No tingling, numbness, weakness.  PSYCHIATRY: No anxiety or depression.   MEDICATIONS AT HOME:  Prior to Admission medications   Medication Sig Start Date End Date Taking? Authorizing Provider  allopurinol (ZYLOPRIM) 100 MG tablet Take 100 mg by mouth daily. 10/17/15  Yes Historical Provider, MD  doxazosin (CARDURA) 2 MG tablet Take 2 mg by mouth daily.   Yes Historical Provider, MD  ferrous sulfate 325 (65 FE) MG tablet Take 325 mg by mouth daily with breakfast. 08/30/15  Yes Historical Provider, MD  furosemide (LASIX) 40 MG tablet Take 40 mg by mouth daily. 10/17/15  Yes  Historical Provider, MD  glipiZIDE (GLUCOTROL) 5 MG tablet Take 5 mg by mouth daily. 12/27/16  Yes Historical Provider, MD  hydrALAZINE (APRESOLINE) 50 MG tablet Take 50 mg by mouth 3 (three) times daily.   Yes Historical Provider, MD  sodium bicarbonate 650 MG tablet Take 650 mg by mouth 2 (two) times daily.    Yes Historical Provider, MD  Vitamin D, Cholecalciferol, 1000 UNITS TABS Take by mouth.   Yes Historical Provider, MD  acetaminophen (TYLENOL) 500 MG tablet Take 500 mg by mouth every 8 (eight)  hours as needed.    Historical Provider, MD  aspirin 81 MG tablet Take 81 mg by mouth daily.    Historical Provider, MD  atorvastatin (LIPITOR) 20 MG tablet Take 20 mg by mouth daily.    Historical Provider, MD  baclofen (LIORESAL) 10 MG tablet Take 1 tablet (10 mg total) by mouth 3 (three) times daily. Patient not taking: Reported on 01/30/2017 05/27/16   [name], PA-C  carbamide peroxide (DEBROX) 6.5 % otic solution Place 5 drops into the right ear 2 (two) times daily. Patient not taking: Reported on 01/30/2017 05/27/16 05/27/17  [name], PA-C  ibuprofen (ADVIL,MOTRIN) 800 MG tablet Take 1 tablet (800 mg total) by mouth every 8 (eight) hours as needed. Patient not taking: Reported on 01/30/2017 05/27/16   [name], PA-C  Multiple Vitamin (MULTI-VITAMINS) TABS Take 1 tablet by mouth daily.    Historical Provider, MD  oxyCODONE (ROXICODONE) 5 MG immediate release tablet Take 1 tablet (5 mg total) by mouth every 8 (eight) hours as needed. Patient not taking: Reported on 01/30/2017 09/01/16 09/01/17  [name], MD      PHYSICAL EXAMINATION:   VITAL SIGNS: Blood pressure (!) 156/70, pulse (!) 101, temperature 99.9 F (37.7 C), temperature source Oral, resp. rate (!) 22, height 5\' 7"  (1.702 m), weight 81.6 kg (180 lb), SpO2 96 %.  GENERAL:  76 y.o.-year-old patient lying in the bed with no acute distress.  EYES: Pupils equal, round, reactive to light and accommodation. No scleral icterus. Extraocular muscles intact.  HEENT: Head atraumatic, normocephalic. Oropharynx dry and nasopharynx clear.  NECK:  Supple, no jugular venous distention. No thyroid enlargement, no tenderness.  LUNGS: Normal breath sounds bilaterally, no wheezing, rales,rhonchi or crepitation. No use of accessory muscles of respiration.  CARDIOVASCULAR: S1, S2 normal. No murmurs, rubs, or gallops.  ABDOMEN: Soft, nontender, nondistended. Bowel sounds present. No organomegaly or mass.  Mild tenderness left  costo vertebral angle area EXTREMITIES: No pedal edema, cyanosis, or clubbing.  NEUROLOGIC: Cranial nerves II through XII are intact. Muscle strength 5/5 in all extremities. Sensation intact. Gait not checked.  PSYCHIATRIC: The patient is alert and oriented x 2 SKIN: No obvious rash, lesion, or ulcer.   LABORATORY PANEL:   CBC  Recent Labs Lab 01/30/17 2112  WBC 21.7*  HGB 8.8*  HCT 26.5*  PLT 211  MCV 92.7  MCH 30.9  MCHC 33.3  RDW 14.2  LYMPHSABS 1.5  MONOABS 1.0  EOSABS 0.0  BASOSABS 0.0   ------------------------------------------------------------------------------------------------------------------  Chemistries   Recent Labs Lab 01/30/17 2112  NA 137  K 4.2  CL 107  CO2 21*  GLUCOSE 168*  BUN 76*  CREATININE 6.34*  CALCIUM 8.4*  AST 22  ALT 13*  ALKPHOS 99  BILITOT 0.9   ------------------------------------------------------------------------------------------------------------------ estimated creatinine clearance is 10.1 mL/min (by C-G formula based on SCr of 6.34 mg/dL (H)). ------------------------------------------------------------------------------------------------------------------ No results for input(s): TSH, T4TOTAL, T3FREE, THYROIDAB  in the last 72 hours.  Invalid input(s): FREET3   Coagulation profile No results for input(s): INR, PROTIME in the last 168 hours. ------------------------------------------------------------------------------------------------------------------- No results for input(s): DDIMER in the last 72 hours. -------------------------------------------------------------------------------------------------------------------  Cardiac Enzymes No results for input(s): CKMB, TROPONINI, MYOGLOBIN in the last 168 hours.  Invalid input(s): CK ------------------------------------------------------------------------------------------------------------------ Invalid input(s):  POCBNP  ---------------------------------------------------------------------------------------------------------------  Urinalysis    Component Value Date/Time   COLORURINE YELLOW (A) 01/30/2017 2221   APPEARANCEUR CLOUDY (A) 01/30/2017 2221   APPEARANCEUR Turbid 08/29/2013 2049   LABSPEC 1.010 01/30/2017 2221   LABSPEC 1.015 08/29/2013 2049   PHURINE 6.0 01/30/2017 2221   GLUCOSEU 50 (A) 01/30/2017 2221   GLUCOSEU 50 mg/dL 08/29/2013 2049   HGBUR NEGATIVE 01/30/2017 [place] NEGATIVE 01/30/2017 2221   BILIRUBINUR Negative 08/29/2013 2049   KETONESUR NEGATIVE 01/30/2017 2221   PROTEINUR >=300 (A) 01/30/2017 2221   NITRITE NEGATIVE 01/30/2017 2221   LEUKOCYTESUR LARGE (A) 01/30/2017 2221   LEUKOCYTESUR 3+ 08/29/2013 2049     RADIOLOGY: Dg Chest 2 View  Result Date: 01/30/2017 CLINICAL DATA:  Fever, weakness, and pain in both legs since the morning. [place] getting out of bed today. EXAM: CHEST  2 VIEW COMPARISON:  09/01/2016 FINDINGS: Shallow inspiration. Slight linear atelectasis in the left mid lung. No focal airspace disease or consolidation. No blunting of costophrenic angles. No pneumothorax. Mediastinal contours appear intact. Normal heart size and pulmonary vascularity. Degenerative changes in the spine. IMPRESSION: Shallow inspiration with slight linear atelectasis in the left mid lung. No evidence of active pulmonary disease. Electronically Signed   By: Lucienne Capers M.D.   On: 01/30/2017 21:40    EKG: Orders placed or performed during the hospital encounter of 01/30/17  . EKG 12-Lead  . EKG 12-Lead    IMPRESSION AND PLAN: 76 year old male patient with history of chronic kidney disease stage IV, hypertension, hyperlipidemia, type 2 diabetes mellitus presented to the emergency room with fever and dysuria. Admitting diagnosis 1. Sepsis 2. Urinary tract infection 3. Dehydration 4. Leukocytosis 5. Anemia of chronic disease 6. Chronic kidney disease stage  IV 7. Hypertension 8. Hyperlipidemia Treatment plan Admit patient to inpatient service IV fluid hydration based on sepsis protocol. Start patient on IV Rocephin antibiotic Patient received vancomycin and Zosyn antibiotic in the emergency room Follow-up cultures Follow-up renal function and electrolytes DVT prophylaxis subcutaneous heparin Supportive care All the records are reviewed and case discussed with ED provider. Management plans discussed with the patient, family and they are in agreement.  CODE STATUS:FULL CODE    Code Status Orders        Start     Ordered   01/31/17 0235  Full code  Continuous     01/31/17 0236    Code Status History    Date Active Date Inactive Code Status Order ID Comments User Context   This patient has a current code status but no historical code status.       TOTAL TIME TAKING CARE OF THIS PATIENT: 52 minutes.    Saundra Shelling M.D on 01/31/2017 at 3:32 AM  Between 7am to 6pm - Pager - [phone]  After 6pm go to [email or web address] - password [email or web address] [facility]  Office  [phone]  CC: Primary care physician; [name], MD

## 2017-02-01 ENCOUNTER — Inpatient Hospital Stay: Payer: Medicare Other

## 2017-02-01 LAB — BASIC METABOLIC PANEL
ANION GAP: 6 (ref 5–15)
BUN: 68 mg/dL — AB (ref 6–20)
CHLORIDE: 113 mmol/L — AB (ref 101–111)
CO2: 20 mmol/L — ABNORMAL LOW (ref 22–32)
Calcium: 7.9 mg/dL — ABNORMAL LOW (ref 8.9–10.3)
Creatinine, Ser: 6.22 mg/dL — ABNORMAL HIGH (ref 0.61–1.24)
GFR calc Af Amer: 9 mL/min — ABNORMAL LOW (ref >60)
GFR, EST NON AFRICAN AMERICAN: 8 mL/min — AB (ref >60)
GLUCOSE: 85 mg/dL (ref 65–99)
POTASSIUM: 4 mmol/L (ref 3.5–5.1)
Sodium: 139 mmol/L (ref 135–145)

## 2017-02-01 LAB — GLUCOSE, CAPILLARY
GLUCOSE-CAPILLARY: 144 mg/dL — AB (ref 65–99)
GLUCOSE-CAPILLARY: 134 mg/dL — AB (ref 65–99)
GLUCOSE-CAPILLARY: 137 mg/dL — AB (ref 65–99)
Glucose-Capillary: 93 mg/dL (ref 65–99)

## 2017-02-01 LAB — CBC
HEMATOCRIT: 23.4 % — AB (ref 40.0–52.0)
HEMOGLOBIN: 7.6 g/dL — AB (ref 13.0–18.0)
MCH: 30.7 pg (ref 26.0–34.0)
MCHC: 32.7 g/dL (ref 32.0–36.0)
MCV: 94.1 fL (ref 80.0–100.0)
PLATELETS: 173 10*3/uL (ref 150–440)
RBC: 2.48 MIL/uL — ABNORMAL LOW (ref 4.40–5.90)
RDW: 14.4 % (ref 11.5–14.5)
WBC: 19.8 10*3/uL — ABNORMAL HIGH (ref 3.8–10.6)

## 2017-02-01 MED ORDER — PANTOPRAZOLE SODIUM 40 MG PO TBEC
40.0000 mg | DELAYED_RELEASE_TABLET | Freq: Every day | ORAL | Status: DC
  Administered 2017-02-01 – 2017-02-02 (×2): 40 mg via ORAL
  Filled 2017-02-01 (×2): qty 1

## 2017-02-01 NOTE — Progress Notes (Signed)
[facility] at [facility]                                                                                                                                                                                  Patient Demographics   Martin Gilbert, is a 76 y.o. male, DOB - 09-Mar-1941, FOY:[id number]  Admit date - 01/30/2017   Admitting Physician Saundra Shelling, MD  Outpatient Primary MD for the patient is [name], MD    Subjective: Patient admitted with sepsis and a UTI. Intermittent nausea and vomiting and left-sided abdominal pain, no flank pain, admits of suprapubic pain as well as dysuria symptoms, is concerned about prostate infection. Overall, patient feels a little bit better than on admission, still very weak    Review of Systems:   CONSTITUTIONAL: No documented fever. No fatigue,Significant weakness weakness. No weight gain, no weight loss.  EYES: No blurry or double vision.  ENT: No tinnitus. No postnasal drip. No redness of the oropharynx.  RESPIRATORY: No cough, no wheeze, no hemoptysis. No dyspnea.  CARDIOVASCULAR: No chest pain. No orthopnea. No palpitations. No syncope.  GASTROINTESTINAL: Positive nausea, as it if vomiting or diarrhea. No abdominal pain. No melena or hematochezia.  GENITOURINARY: No dysuria or hematuria.  ENDOCRINE: No polyuria or nocturia. No heat or cold intolerance.  HEMATOLOGY: No anemia. No bruising. No bleeding.  INTEGUMENTARY: No rashes. No lesions.  MUSCULOSKELETAL: No arthritis. No swelling. No gout.  NEUROLOGIC: No numbness, tingling, or ataxia. No seizure-type activity.  PSYCHIATRIC: No anxiety. No insomnia. No ADD.    Vitals:   Vitals:   01/31/17 2007 01/31/17 2056 02/01/17 0441 02/01/17 1102  BP: (!) 155/54  (!) 136/57 (!) 148/62  Pulse: 87  80 83  Resp: 18  18 18   Temp:  98.7 F (37.1 C) 98.1 F (36.7 C) 98.3 F (36.8 C)  TempSrc:  Oral Oral Oral  SpO2: 90%  93% 98%  Weight:      Height:         Wt Readings from Last 3 Encounters:  01/31/17 80.2 kg (176 lb 14.4 oz)  09/01/16 81.6 kg (180 lb)  07/30/16 81.6 kg (180 lb)     Intake/Output Summary (Last 24 hours) at 02/01/17 1459 Last data filed at 02/01/17 1322  Gross per 24 hour  Intake          3560.83 ml  Output              850 ml  Net          2710.83 ml    Physical Exam:   GENERAL: Pleasant-appearing in no apparent distress.  HEAD, EYES, EARS,  NOSE AND THROAT: Atraumatic, normocephalic. Extraocular muscles are intact. Pupils equal and reactive to light. Sclerae anicteric. No conjunctival injection. No oro-pharyngeal erythema.  NECK: Supple. There is no jugular venous distention. No bruits, no lymphadenopathy, no thyromegaly.  HEART: Regular rate and rhythm,. No murmurs, no rubs, no clicks.  LUNGS: Clear to auscultation bilaterally. No rales or rhonchi. No wheezes. Significant tenderness on left chest/rib palpation ABDOMEN: Soft, flat, nontender, nondistended. Has good bowel sounds. No hepatosplenomegaly appreciated.  EXTREMITIES: No evidence of any cyanosis, clubbing, or peripheral edema.  +2 pedal and radial pulses bilaterally.  NEUROLOGIC: The patient is alert, awake, and oriented x3 with no focal motor or sensory deficits appreciated bilaterally.  SKIN: Moist and warm with no rashes appreciated.  Psych: Not anxious, depressed LN: No inguinal LN enlargement    Antibiotics   Anti-infectives    Start     Dose/Rate Route Frequency Ordered Stop   01/31/17 2200  cefTRIAXone (ROCEPHIN) 1 g in dextrose 5 % 50 mL IVPB     1 g 100 mL/hr over 30 Minutes Intravenous Every 24 hours 01/31/17 0349     01/31/17 0345  cefTRIAXone (ROCEPHIN) 2 g in dextrose 5 % 50 mL IVPB  Status:  Discontinued     2 g 100 mL/hr over 30 Minutes Intravenous  Once 01/31/17 0337 01/31/17 0339   01/31/17 0345  cefTRIAXone (ROCEPHIN) 2 g in dextrose 5 % 50 mL IVPB     2 g 100 mL/hr over 30 Minutes Intravenous  Once 01/31/17 0339 01/31/17  0428   01/30/17 2145  vancomycin (VANCOCIN) IVPB 1000 mg/200 mL premix     1,000 mg 200 mL/hr over 60 Minutes Intravenous  Once 01/30/17 2137 01/30/17 2255   01/30/17 2145  piperacillin-tazobactam (ZOSYN) IVPB 3.375 g     3.375 g 12.5 mL/hr over 240 Minutes Intravenous  Once 01/30/17 2137 01/31/17 0155      Medications   Scheduled Meds: . atorvastatin  20 mg Oral Daily  . cefTRIAXone (ROCEPHIN) IVPB 1 gram/50 mL D5W  1 g Intravenous Q24H  . doxazosin  2 mg Oral Daily  . heparin  5,000 Units Subcutaneous Q8H  . Influenza vac split quadrivalent PF  0.5 mL Intramuscular Tomorrow-1000  . insulin aspart  0-15 Units Subcutaneous TID WC  . insulin aspart  0-5 Units Subcutaneous QHS  . pneumococcal 23 valent vaccine  0.5 mL Intramuscular Tomorrow-1000  . sodium bicarbonate  650 mg Oral BID  . sodium chloride flush  3 mL Intravenous Q12H   Continuous Infusions:  PRN Meds:.acetaminophen, ondansetron **OR** ondansetron (ZOFRAN) IV, oxyCODONE, senna-docusate   Data Review:   Micro Results Recent Results (from the past 240 hour(s))  Culture, blood (Routine x 2)     Status: None (Preliminary result)   Collection Time: 01/30/17  9:12 PM  Result Value Ref Range Status   Specimen Description BLOOD L HAND  Final   Special Requests BOTTLES DRAWN AEROBIC AND ANAEROBIC BCAV  Final   Culture NO GROWTH 2 DAYS  Final   Report Status PENDING  Incomplete  Culture, blood (Routine x 2)     Status: None (Preliminary result)   Collection Time: 01/30/17  9:12 PM  Result Value Ref Range Status   Specimen Description BLOOD L FOREARM  Final   Special Requests BOTTLES DRAWN AEROBIC AND ANAEROBIC BCLV  Final   Culture NO GROWTH 2 DAYS  Final   Report Status PENDING  Incomplete  Urine culture     Status: Abnormal (Preliminary result)  Collection Time: 01/30/17 10:21 PM  Result Value Ref Range Status   Specimen Description URINE, CLEAN CATCH  Final   Special Requests Normal  Final   Culture (A)  Final     >=100,000 COLONIES/mL ESCHERICHIA COLI SUSCEPTIBILITIES TO FOLLOW Performed at [facility], [address] [place], [place] [address]    Report Status PENDING  Incomplete    Radiology Reports Dg Chest 2 View  Result Date: 01/30/2017 CLINICAL DATA:  Fever, weakness, and pain in both legs since the morning. [place] getting out of bed today. EXAM: CHEST  2 VIEW COMPARISON:  09/01/2016 FINDINGS: Shallow inspiration. Slight linear atelectasis in the left mid lung. No focal airspace disease or consolidation. No blunting of costophrenic angles. No pneumothorax. Mediastinal contours appear intact. Normal heart size and pulmonary vascularity. Degenerative changes in the spine. IMPRESSION: Shallow inspiration with slight linear atelectasis in the left mid lung. No evidence of active pulmonary disease. Electronically Signed   By: Lucienne Capers M.D.   On: 01/30/2017 21:40   Dg Ribs Unilateral Left  Result Date: 02/01/2017 CLINICAL DATA:  Patient with left-sided chest pain. EXAM: LEFT RIBS - 2 VIEW COMPARISON:  Chest radiograph 01/30/2017. FINDINGS: No evidence for displaced left-sided rib fracture. Elevation left hemidiaphragm. No large area of pulmonary consolidation. Stable nodular opacity left lung apex, similar in appearance to chest radiograph 05/31/2009. IMPRESSION: No acute osseous abnormality. No evidence for left-sided displaced rib fracture. Electronically Signed   By: Lovey Newcomer M.D.   On: 02/01/2017 13:53   Dg Lumbar Spine 2-3 Views  Result Date: 01/31/2017 CLINICAL DATA:  Lower back pain without known injury. EXAM: LUMBAR SPINE - 2-3 VIEW COMPARISON:  None. FINDINGS: No fracture or spondylolisthesis is noted. Mild degenerative disc disease is noted at T12-L1 and L1-2 with anterior osteophyte formation. Remaining disc spaces appear intact. IMPRESSION: Mild degenerative disc disease as described above. No acute abnormality seen in the lumbar spine. Electronically Signed   By: Marijo Conception, M.D.   On: 01/31/2017 10:14   Dg Pelvis 1-2 Views  Result Date: 01/31/2017 CLINICAL DATA:  Pelvic pain without known injury. EXAM: PELVIS - 1-2 VIEW COMPARISON:  None. FINDINGS: There is no evidence of pelvic fracture or diastasis. No pelvic bone lesions are seen. IMPRESSION: Normal pelvis. Electronically Signed   By: Marijo Conception, M.D.   On: 01/31/2017 10:12   US Renal  Result Date: 01/31/2017 CLINICAL DATA:  Chronic kidney disease. EXAM: RENAL / URINARY TRACT ULTRASOUND COMPLETE COMPARISON:  02/12/2016 CT abdomen/ pelvis. FINDINGS: Right Kidney: Length: 10.5 cm. Mildly echogenic right kidney with mild right renal parenchymal atrophy. No right hydronephrosis. No right renal mass. Left Kidney: Length: 11.2 cm. Mildly echogenic left kidney with mild left renal parenchymal atrophy. No left hydronephrosis. Simple appearing 0.8 x 0.8 x 1.0 cm partially exophytic renal cyst in the lower left kidney. Bladder: Appears normal for degree of bladder distention. Mass-effect on the bladder base by the enlarged prostate, which measures approximately 5.4 x 4.5 x 4.2 cm. IMPRESSION: 1. No hydronephrosis. 2. Mildly echogenic and mildly atrophic kidneys bilaterally, compatible with the provided history of chronic kidney disease. 3. Tiny benign-appearing lower left renal cyst. 4. Normal bladder. 5. Enlarged prostate. Electronically Signed   By: Ilona Sorrel M.D.   On: 01/31/2017 10:02     CBC  Recent Labs Lab 01/30/17 2112 01/31/17 0627 02/01/17 0524  WBC 21.7* 25.9* 19.8*  HGB 8.8* 8.0* 7.6*  HCT 26.5* 24.2* 23.4*  PLT 211  180 173  MCV 92.7 93.8 94.1  MCH 30.9 31.0 30.7  MCHC 33.3 33.1 32.7  RDW 14.2 14.0 14.4  LYMPHSABS 1.5  --   --   MONOABS 1.0  --   --   EOSABS 0.0  --   --   BASOSABS 0.0  --   --     Chemistries   Recent Labs Lab 01/30/17 2112 01/31/17 0627 02/01/17 0524  NA 137 140 139  K 4.2 4.0 4.0  CL 107 110 113*  CO2 21* 21* 20*  GLUCOSE 168* 131* 85  BUN 76* 72*  68*  CREATININE 6.34* 6.00* 6.22*  CALCIUM 8.4* 7.8* 7.9*  AST 22  --   --   ALT 13*  --   --   ALKPHOS 99  --   --   BILITOT 0.9  --   --    ------------------------------------------------------------------------------------------------------------------ estimated creatinine clearance is 10.2 mL/min (by C-G formula based on SCr of 6.22 mg/dL (H)). ------------------------------------------------------------------------------------------------------------------ No results for input(s): HGBA1C in the last 72 hours. ------------------------------------------------------------------------------------------------------------------ No results for input(s): CHOL, HDL, LDLCALC, TRIG, CHOLHDL, LDLDIRECT in the last 72 hours. ------------------------------------------------------------------------------------------------------------------ No results for input(s): TSH, T4TOTAL, T3FREE, THYROIDAB in the last 72 hours.  Invalid input(s): FREET3 ------------------------------------------------------------------------------------------------------------------ No results for input(s): VITAMINB12, FOLATE, FERRITIN, TIBC, IRON, RETICCTPCT in the last 72 hours.  Coagulation profile No results for input(s): INR, PROTIME in the last 168 hours.  No results for input(s): DDIMER in the last 72 hours.  Cardiac Enzymes No results for input(s): CKMB, TROPONINI, MYOGLOBIN in the last 168 hours.  Invalid input(s): CK ------------------------------------------------------------------------------------------------------------------ Invalid input(s): [place]  Patient is 76 year old admitted with sepsis due to UTI 1. Sepsis related to urinary tract infection, ? acute prostatitis due to Escherichia coli,  continue therapy with IV ceftriaxone, awaiting for sensitivities, possible discharge home within the next few days 2. Urinary tract infection due to Escherichia coli, questionable acute  prostatitis, ultrasound of the abdomen reviewed there is no evidence of stone or hydronephrosis. The patient will need to have urologist follow-up as outpatient .  3.  left-sided rib pain due to fall, getting X-ray, incentive spirometry, pain medications as needed 4. Leukocytosis related to #1 , improving WBC, follow closely 5. Anemia of chronic disease, may need to be transfused, we'll discuss tomorrow pros and cons 6. Chronic kidney disease stage IV renal function close to baseline if worsens will consider nephrology evaluation 7. Hypertension blood pressure is currently stable, discontinue IV fluids 8. Hyperlipidemia continue atorvastatin 9. Intermittent nausea and vomiting of unclear etiology, follow oral intake, continue patient on Protonix 10. Generalized weakness, get physical therapist involved for recommendations, may not be able to go back home immediately    Code Status Orders        Start     Ordered   01/31/17 0235  Full code  Continuous     01/31/17 0236    Code Status History    Date Active Date Inactive Code Status Order ID Comments User Context   This patient has a current code status but no historical code status.           Consults   none DVT Prophylaxis  heparin  Lab Results  Component Value Date   PLT 173 02/01/2017     Time Spent in minutes   39min  Recent unplanned, DC planning was discussed this patient and family extensively via Spanish interpreter   Kayla Deshaies M.D on 02/01/2017 at 2:59  PM  Between 7am to 6pm - Pager - [phone]  After 6pm go to [email or web address] - password [email or web address] [facility]   Office  [phone]

## 2017-02-01 NOTE — Progress Notes (Signed)
Nsr. Room air. Takes meds ok. A & O. Pt reported pain and received meds. Family at the bedside. Mri and xray was done. Pt has no further concerns at this time.

## 2017-02-02 DIAGNOSIS — R531 Weakness: Secondary | ICD-10-CM

## 2017-02-02 DIAGNOSIS — E872 Acidosis, unspecified: Secondary | ICD-10-CM

## 2017-02-02 DIAGNOSIS — N390 Urinary tract infection, site not specified: Secondary | ICD-10-CM

## 2017-02-02 DIAGNOSIS — H10022 Other mucopurulent conjunctivitis, left eye: Secondary | ICD-10-CM

## 2017-02-02 DIAGNOSIS — W19XXXA Unspecified fall, initial encounter: Secondary | ICD-10-CM

## 2017-02-02 DIAGNOSIS — R110 Nausea: Secondary | ICD-10-CM

## 2017-02-02 DIAGNOSIS — R112 Nausea with vomiting, unspecified: Secondary | ICD-10-CM

## 2017-02-02 DIAGNOSIS — E1140 Type 2 diabetes mellitus with diabetic neuropathy, unspecified: Secondary | ICD-10-CM

## 2017-02-02 DIAGNOSIS — D72829 Elevated white blood cell count, unspecified: Secondary | ICD-10-CM

## 2017-02-02 DIAGNOSIS — A498 Other bacterial infections of unspecified site: Secondary | ICD-10-CM

## 2017-02-02 DIAGNOSIS — R0789 Other chest pain: Secondary | ICD-10-CM

## 2017-02-02 DIAGNOSIS — D638 Anemia in other chronic diseases classified elsewhere: Secondary | ICD-10-CM

## 2017-02-02 LAB — URINE CULTURE: SPECIAL REQUESTS: NORMAL

## 2017-02-02 LAB — CBC
HCT: 25.3 % — ABNORMAL LOW (ref 40.0–52.0)
Hemoglobin: 8.1 g/dL — ABNORMAL LOW (ref 13.0–18.0)
MCH: 30.6 pg (ref 26.0–34.0)
MCHC: 32.1 g/dL (ref 32.0–36.0)
MCV: 95.1 fL (ref 80.0–100.0)
PLATELETS: 194 10*3/uL (ref 150–440)
RBC: 2.66 MIL/uL — AB (ref 4.40–5.90)
RDW: 14.6 % — AB (ref 11.5–14.5)
WBC: 9.6 10*3/uL (ref 3.8–10.6)

## 2017-02-02 LAB — BASIC METABOLIC PANEL
Anion gap: 10 (ref 5–15)
BUN: 66 mg/dL — AB (ref 6–20)
CALCIUM: 8.3 mg/dL — AB (ref 8.9–10.3)
CO2: 19 mmol/L — ABNORMAL LOW (ref 22–32)
CREATININE: 6.22 mg/dL — AB (ref 0.61–1.24)
Chloride: 113 mmol/L — ABNORMAL HIGH (ref 101–111)
GFR, EST AFRICAN AMERICAN: 9 mL/min — AB (ref >60)
GFR, EST NON AFRICAN AMERICAN: 8 mL/min — AB (ref >60)
Glucose, Bld: 103 mg/dL — ABNORMAL HIGH (ref 65–99)
Potassium: 4.1 mmol/L (ref 3.5–5.1)
SODIUM: 142 mmol/L (ref 135–145)

## 2017-02-02 LAB — GLUCOSE, CAPILLARY
GLUCOSE-CAPILLARY: 119 mg/dL — AB (ref 65–99)
GLUCOSE-CAPILLARY: 139 mg/dL — AB (ref 65–99)

## 2017-02-02 MED ORDER — ONDANSETRON HCL 4 MG PO TABS: 4.0000 mg | ORAL_TABLET | Freq: Four times a day (QID) | ORAL | 0 refills | Status: DC | PRN

## 2017-02-02 MED ORDER — GABAPENTIN 100 MG PO CAPS: 100.0000 mg | ORAL_CAPSULE | Freq: Three times a day (TID) | ORAL | Status: DC | PRN

## 2017-02-02 MED ORDER — GABAPENTIN 100 MG PO CAPS: 100.0000 mg | ORAL_CAPSULE | Freq: Three times a day (TID) | ORAL | Status: DC

## 2017-02-02 MED ORDER — CIPROFLOXACIN HCL 0.3 % OP SOLN
1.0000 [drp] | OPHTHALMIC | Status: DC
  Administered 2017-02-02: 1 [drp] via OPHTHALMIC
  Filled 2017-02-02: qty 2.5

## 2017-02-02 MED ORDER — GLUCERNA SHAKE PO LIQD: 237.0000 mL | Freq: Three times a day (TID) | ORAL | 6 refills | Status: DC

## 2017-02-02 MED ORDER — CIPROFLOXACIN HCL 0.3 % OP SOLN: 1.0000 [drp] | OPHTHALMIC | 0 refills | Status: DC

## 2017-02-02 MED ORDER — GLUCERNA SHAKE PO LIQD
237.0000 mL | Freq: Three times a day (TID) | ORAL | Status: DC
  Administered 2017-02-02: 237 mL via ORAL

## 2017-02-02 MED ORDER — SENNOSIDES-DOCUSATE SODIUM 8.6-50 MG PO TABS: 1.0000 | ORAL_TABLET | Freq: Every evening | ORAL | 6 refills | Status: DC | PRN

## 2017-02-02 MED ORDER — CEPHALEXIN 500 MG PO CAPS: 500.0000 mg | ORAL_CAPSULE | Freq: Two times a day (BID) | ORAL | 0 refills | Status: DC

## 2017-02-02 MED ORDER — GABAPENTIN 100 MG PO CAPS: 100.0000 mg | ORAL_CAPSULE | Freq: Three times a day (TID) | ORAL | 0 refills | Status: DC | PRN

## 2017-02-02 MED ORDER — PANTOPRAZOLE SODIUM 40 MG PO TBEC: 40.0000 mg | DELAYED_RELEASE_TABLET | Freq: Every day | ORAL | 6 refills | Status: DC

## 2017-02-02 MED ORDER — ALBUTEROL SULFATE (2.5 MG/3ML) 0.083% IN NEBU
2.5000 mg | INHALATION_SOLUTION | RESPIRATORY_TRACT | Status: DC | PRN
  Administered 2017-02-02 (×2): 2.5 mg via RESPIRATORY_TRACT
  Filled 2017-02-02 (×2): qty 3

## 2017-02-02 NOTE — Discharge Summary (Signed)
[place] [facility] [place] NAME: Martin Gilbert    MR#:  [id number]  DATE OF BIRTH:  1941/06/20  DATE OF ADMISSION:  01/30/2017 ADMITTING PHYSICIAN: Saundra Shelling, MD  DATE OF DISCHARGE: No discharge date for patient encounter.  PRIMARY CARE PHYSICIAN: [name], MD     ADMISSION DIAGNOSIS:  CKD (chronic kidney disease) [N18.9] Sepsis, due to unspecified organism ([place]) [A41.9] Urinary tract infection without hematuria, site unspecified [N39.0]  DISCHARGE DIAGNOSIS:  Principal Problem:   Sepsis ([place]) Active Problems:   UTI (urinary tract infection)   E coli infection   Lactic acidosis   Left-sided chest wall pain   Fall   Generalized weakness   Leukocytosis   Nausea   Pink eye, left   Diabetic neuropathy ([place])   Anemia of chronic disease   SECONDARY DIAGNOSIS:   Past Medical History:  Diagnosis Date  . Back pain   . BPH (benign prostatic hyperplasia)   . CKD (chronic kidney disease), stage IV ([place])   . Diabetes mellitus without complication ([place])   . HTN (hypertension)   . Hyperlipidemia     .pro HOSPITAL COURSE:   The patient is a 76 year old Spanish male with past medical history significant for history of multiple medical problems, including back pain, CK D stage IV, BPH, diabetes, hypertension, hyperlipidemia, gout, who presents to the hospital with complaints of fever and confusion, foul smelling urine, dysuria, increased frequency of urination. In emergency room, patient was noted to be tachycardic, initiated on IV fluids based on sepsis protocol and admitted. Initial labs revealed elevated white blood cell count to about 22,000. Urinalysis revealed pyuria. Patient was initiated on Rocephin intravenously and improved clinically. He is blood cultures came back negative, urine culture showed 100,000 colony-forming units of Escherichia coli, sensitive to multiple antibiotics but resistant to ampicillin,  trimethoprim sulfamethoxazole, intermediately sensitive to ampicillin/ sulbactam. Because of fall, he was complaining of left-sided pain, rib x-ray showed no fracture. Patient also had complaints about feet numbness and pain, he underwent MRI of lumbar spine, revealing specifically inflammation of the erector spinae muscles and upper sacral and lower lumbar area, degenerative joint disease was also noted, due to concern of diabetic neuropathy, the patient was initiated on low-dose of gabapentin.  His kidney function remained stable. Ultrasound of kidneys was unremarkable. The patient was noted to have left lower extremity swelling, Doppler ultrasound revealed no DVT. Patient was evaluated by physical therapist, recommended home health services, patient was felt to be stable to be discharged home today. Discussion by problem: 1.Sepsis related to urinary tract infection, ? acute prostatitis due to Escherichia coli,  continue therapy with Keflex to complete 7 day course, may need to have prolonged antibiotic therapy if recurrent symptoms and concerns of acute prostatitis. 2.Urinary tract infection due to Escherichia coli,  ultrasound of the abdomen reviewed there is no evidence of stone or hydronephrosis. The patient will need to have urologist follow-up as outpatient if recurrent symptoms and concerns for acute prostatitis .  3. left-sided rib pain due to fall, X-ray revealed no rib fracture, the patient is to continue incentive spirometry, pain medications as needed 4.Leukocytosis related to #1 , normalized WBC 5.Anemia of chronic disease, hemoglobin level has improved stopping IV fluids, now 8.1, no need for transfusion  6.Chronic kidney disease stage IV renal function close to baseline patient is to follow-up as outpatient primary nephrologist, as per prior schedule 7.Hypertension blood pressure is stable, patient is to resume  hydralazine as per outpatient medication regiment 8.Hyperlipidemia  continue atorvastatin 9. Intermittent nausea and vomiting of unclear etiology, could be gastroparesis due to diabetes, patient is to continue Protonix and Zofran, patient's family were advised to consider buying nutritional supplements including Glucerna. Patient is to stop ibuprofen 10. Generalized weakness, appreciated physical therapist recommendations, I'll discharge patient with home health services, prescription for rolling walker is given upon discharge 11. Left conjunctivitis, initiate patient on ciprofloxacin drops, follow-up with primary ophthalmologist as outpatient if no improvement 12. Left lower extremity swelling, no DVT 13. Lower extremity numbness, no peripheral vascular disease, clinically, MRI of the lumbar spine was unremarkable, likely diabetic neuropathy, initiate patient on gabapentin, patient's family was advised that the patient may become more somnolent on this medication, they voiced understanding  DISCHARGE CONDITIONS:   Stable  CONSULTS OBTAINED:    DRUG ALLERGIES:   Allergies  Allergen Reactions  . Acetaminophen Hives    Possible lip swelling.  Patient is not sure.  09/01/16- patient denies allergy  . Aspirin Rash    09/01/16- patient denies allergy    DISCHARGE MEDICATIONS:   Current Discharge Medication List    START taking these medications   Details  cephALEXin (KEFLEX) 500 MG capsule Take 1 capsule (500 mg total) by mouth 2 (two) times daily. Qty: 10 capsule, Refills: 0    ciprofloxacin (CILOXAN) 0.3 % ophthalmic solution Place 1 drop into the left eye every 4 (four) hours while awake. Administer 1 drop, every 2 hours, while awake, for 2 days. Then 1 drop, every 4 hours, while awake, for the next 5 days. Qty: 5 mL, Refills: 0    feeding supplement, GLUCERNA SHAKE, (GLUCERNA SHAKE) LIQD Take 237 mLs by mouth 3 (three) times daily between meals. Qty: 90 Can, Refills: 6    gabapentin (NEURONTIN) 100 MG capsule Take 1 capsule (100 mg total) by mouth  every 8 (eight) hours as needed (tingling or numbness in feet). Qty: 90 capsule, Refills: 0    ondansetron (ZOFRAN) 4 MG tablet Take 1 tablet (4 mg total) by mouth every 6 (six) hours as needed for nausea. Qty: 20 tablet, Refills: 0    pantoprazole (PROTONIX) 40 MG tablet Take 1 tablet (40 mg total) by mouth daily. Qty: 30 tablet, Refills: 6    senna-docusate (SENOKOT-S) 8.6-50 MG tablet Take 1 tablet by mouth at bedtime as needed for mild constipation. Qty: 30 tablet, Refills: 6      CONTINUE these medications which have NOT CHANGED   Details  allopurinol (ZYLOPRIM) 100 MG tablet Take 100 mg by mouth daily.    doxazosin (CARDURA) 2 MG tablet Take 2 mg by mouth daily.    ferrous sulfate 325 (65 FE) MG tablet Take 325 mg by mouth daily with breakfast. Refills: 11    furosemide (LASIX) 40 MG tablet Take 40 mg by mouth daily.    glipiZIDE (GLUCOTROL) 5 MG tablet Take 5 mg by mouth daily.    hydrALAZINE (APRESOLINE) 50 MG tablet Take 50 mg by mouth 3 (three) times daily.    sodium bicarbonate 650 MG tablet Take 650 mg by mouth 2 (two) times daily.     Vitamin D, Cholecalciferol, 1000 UNITS TABS Take by mouth.    acetaminophen (TYLENOL) 500 MG tablet Take 500 mg by mouth every 8 (eight) hours as needed.    aspirin 81 MG tablet Take 81 mg by mouth daily.    atorvastatin (LIPITOR) 20 MG tablet Take 20 mg by mouth daily.    baclofen (  LIORESAL) 10 MG tablet Take 1 tablet (10 mg total) by mouth 3 (three) times daily. Qty: 30 tablet, Refills: 0    carbamide peroxide (DEBROX) 6.5 % otic solution Place 5 drops into the right ear 2 (two) times daily. Qty: 15 mL, Refills: 2    Multiple Vitamin (MULTI-VITAMINS) TABS Take 1 tablet by mouth daily.    oxyCODONE (ROXICODONE) 5 MG immediate release tablet Take 1 tablet (5 mg total) by mouth every 8 (eight) hours as needed. Qty: 20 tablet, Refills: 0      STOP taking these medications     ibuprofen (ADVIL,MOTRIN) 800 MG tablet           DISCHARGE INSTRUCTIONS:    The patient is to follow-up with primary care physician as outpatient  If you experience worsening of your admission symptoms, develop shortness of breath, life threatening emergency, suicidal or homicidal thoughts you must seek medical attention immediately by calling 911 or calling your MD immediately  if symptoms less severe.  You Must read complete instructions/literature along with all the possible adverse reactions/side effects for all the Medicines you take and that have been prescribed to you. Take any new Medicines after you have completely understood and accept all the possible adverse reactions/side effects.   Please note  You were cared for by a hospitalist during your hospital stay. If you have any questions about your discharge medications or the care you received while you were in the hospital after you are discharged, you can call the unit and asked to speak with the hospitalist on call if the hospitalist that took care of you is not available. Once you are discharged, your primary care physician will handle any further medical issues. Please note that NO REFILLS for any discharge medications will be authorized once you are discharged, as it is imperative that you return to your primary care physician (or establish a relationship with a primary care physician if you do not have one) for your aftercare needs so that they can reassess your need for medications and monitor your lab values.    Today   CHIEF COMPLAINT:   Chief Complaint  Patient presents with  . Fever  . Code Sepsis    HISTORY OF PRESENT ILLNESS:  Isao Seltzer  is a 76 y.o. male with a known history of multiple medical problems, including back pain, CK D stage IV, BPH, diabetes, hypertension, hyperlipidemia, gout, who presents to the hospital with complaints of fever and confusion, foul smelling urine, dysuria, increased frequency of urination. In emergency room,  patient was noted to be tachycardic, initiated on IV fluids based on sepsis protocol and admitted. Initial labs revealed elevated white blood cell count to about 22,000. Urinalysis revealed pyuria. Patient was initiated on Rocephin intravenously and improved clinically. He is blood cultures came back negative, urine culture showed 100,000 colony-forming units of Escherichia coli, sensitive to multiple antibiotics but resistant to ampicillin, trimethoprim sulfamethoxazole, intermediately sensitive to ampicillin/ sulbactam. Because of fall, he was complaining of left-sided pain, rib x-ray showed no fracture. Patient also had complaints about feet numbness and pain, he underwent MRI of lumbar spine, revealing specifically inflammation of the erector spinae muscles and upper sacral and lower lumbar area, degenerative joint disease was also noted, due to concern of diabetic neuropathy, the patient was initiated on low-dose of gabapentin.  His kidney function remained stable. Ultrasound of kidneys was unremarkable. The patient was noted to have left lower extremity swelling, Doppler ultrasound revealed no DVT. Patient  was evaluated by physical therapist, recommended home health services, patient was felt to be stable to be discharged home today. Discussion by problem: 1.Sepsis related to urinary tract infection, ? acute prostatitis due to Escherichia coli,  continue therapy with Keflex to complete 7 day course, may need to have prolonged antibiotic therapy if recurrent symptoms and concerns of acute prostatitis. 2.Urinary tract infection due to Escherichia coli,  ultrasound of the abdomen reviewed there is no evidence of stone or hydronephrosis. The patient will need to have urologist follow-up as outpatient if recurrent symptoms and concerns for acute prostatitis .  3. left-sided rib pain due to fall, X-ray revealed no rib fracture, the patient is to continue incentive spirometry, pain medications as  needed 4.Leukocytosis related to #1 , normalized WBC 5.Anemia of chronic disease, hemoglobin level has improved stopping IV fluids, now 8.1, no need for transfusion  6.Chronic kidney disease stage IV renal function close to baseline patient is to follow-up as outpatient primary nephrologist, as per prior schedule 7.Hypertension blood pressure is stable, patient is to resume hydralazine as per outpatient medication regiment 8.Hyperlipidemia continue atorvastatin 9. Intermittent nausea and vomiting of unclear etiology, could be gastroparesis due to diabetes, patient is to continue Protonix and Zofran, patient's family were advised to consider buying nutritional supplements including Glucerna. Patient is to stop ibuprofen 10. Generalized weakness, appreciated physical therapist recommendations, I'll discharge patient with home health services, prescription for rolling walker is given upon discharge 11. Left conjunctivitis, initiate patient on ciprofloxacin drops, follow-up with primary ophthalmologist as outpatient if no improvement 12. Left lower extremity swelling, no DVT 13. Lower extremity numbness, no peripheral vascular disease, clinically, MRI of the lumbar spine was unremarkable, likely diabetic neuropathy, initiate patient on gabapentin, patient's family was advised that the patient may become more somnolent on this medication, they voiced understanding    VITAL SIGNS:  Blood pressure (!) 152/61, pulse 91, temperature 98.4 F (36.9 C), temperature source Oral, resp. rate 20, height 5\' 7"  (1.702 m), weight 80.2 kg (176 lb 14.4 oz), SpO2 99 %.  I/O:   Intake/Output Summary (Last 24 hours) at 02/02/17 1340 Last data filed at 02/02/17 0809  Gross per 24 hour  Intake                0 ml  Output              600 ml  Net             -600 ml    PHYSICAL EXAMINATION:  GENERAL:  76 y.o.-year-old patient lying in the bed with no acute distress.  EYES: Pupils equal, round, reactive to  light and accommodation. No scleral icterus. Extraocular muscles intact.  HEENT: Head atraumatic, normocephalic. Oropharynx and nasopharynx clear.  NECK:  Supple, no jugular venous distention. No thyroid enlargement, no tenderness.  LUNGS: Normal breath sounds bilaterally, no wheezing, rales,rhonchi or crepitation. No use of accessory muscles of respiration.  CARDIOVASCULAR: S1, S2 normal. No murmurs, rubs, or gallops.  ABDOMEN: Soft, non-tender, non-distended. Bowel sounds present. No organomegaly or mass.  EXTREMITIES: No pedal edema, cyanosis, or clubbing.  NEUROLOGIC: Cranial nerves II through XII are intact. Muscle strength 5/5 in all extremities. Sensation intact. Gait not checked.  PSYCHIATRIC: The patient is alert and oriented x 3.  SKIN: No obvious rash, lesion, or ulcer.   DATA REVIEW:   CBC  Recent Labs Lab 02/02/17 0547  WBC 9.6  HGB 8.1*  HCT 25.3*  PLT 194    Chemistries  Recent Labs Lab 01/30/17 2112  02/02/17 0547  NA 137  < > 142  K 4.2  < > 4.1  CL 107  < > 113*  CO2 21*  < > 19*  GLUCOSE 168*  < > 103*  BUN 76*  < > 66*  CREATININE 6.34*  < > 6.22*  CALCIUM 8.4*  < > 8.3*  AST 22  --   --   ALT 13*  --   --   ALKPHOS 99  --   --   BILITOT 0.9  --   --   < > = values in this interval not displayed.  Cardiac Enzymes No results for input(s): TROPONINI in the last 168 hours.  Microbiology Results  Results for orders placed or performed during the hospital encounter of 01/30/17  Culture, blood (Routine x 2)     Status: None (Preliminary result)   Collection Time: 01/30/17  9:12 PM  Result Value Ref Range Status   Specimen Description BLOOD L HAND  Final   Special Requests BOTTLES DRAWN AEROBIC AND ANAEROBIC BCAV  Final   Culture NO GROWTH 3 DAYS  Final   Report Status PENDING  Incomplete  Culture, blood (Routine x 2)     Status: None (Preliminary result)   Collection Time: 01/30/17  9:12 PM  Result Value Ref Range Status   Specimen Description  BLOOD L FOREARM  Final   Special Requests BOTTLES DRAWN AEROBIC AND ANAEROBIC BCLV  Final   Culture NO GROWTH 3 DAYS  Final   Report Status PENDING  Incomplete  Urine culture     Status: Abnormal   Collection Time: 01/30/17 10:21 PM  Result Value Ref Range Status   Specimen Description URINE, CLEAN CATCH  Final   Special Requests Normal  Final   Culture >=100,000 COLONIES/mL ESCHERICHIA COLI (A)  Final   Report Status 02/02/2017 FINAL  Final   Organism ID, Bacteria ESCHERICHIA COLI (A)  Final      Susceptibility   Escherichia coli - MIC*    AMPICILLIN >=32 RESISTANT Resistant     CEFAZOLIN <=4 SENSITIVE Sensitive     CEFTRIAXONE <=1 SENSITIVE Sensitive     CIPROFLOXACIN <=0.25 SENSITIVE Sensitive     GENTAMICIN <=1 SENSITIVE Sensitive     IMIPENEM <=0.25 SENSITIVE Sensitive     NITROFURANTOIN <=16 SENSITIVE Sensitive     TRIMETH/SULFA >=320 RESISTANT Resistant     AMPICILLIN/SULBACTAM 16 INTERMEDIATE Intermediate     PIP/TAZO <=4 SENSITIVE Sensitive     Extended ESBL NEGATIVE Sensitive     * >=100,000 COLONIES/mL ESCHERICHIA COLI    RADIOLOGY:  Dg Ribs Unilateral Left  Result Date: 02/01/2017 CLINICAL DATA:  Patient with left-sided chest pain. EXAM: LEFT RIBS - 2 VIEW COMPARISON:  Chest radiograph 01/30/2017. FINDINGS: No evidence for displaced left-sided rib fracture. Elevation left hemidiaphragm. No large area of pulmonary consolidation. Stable nodular opacity left lung apex, similar in appearance to chest radiograph 05/31/2009. IMPRESSION: No acute osseous abnormality. No evidence for left-sided displaced rib fracture. Electronically Signed   By: Lovey Newcomer M.D.   On: 02/01/2017 13:53   Mr Lumbar Spine Wo Contrast  Result Date: 02/01/2017 CLINICAL DATA:  76 year old male. Stage 4 chronic kidney disease. Presented to the ED recently with fever and confusion. 102.2 Fahrenheit. Foul smelling urine. Recent fall at home. Left side low back pain. Initial encounter. EXAM: MRI LUMBAR  SPINE WITHOUT CONTRAST TECHNIQUE: Multiplanar, multisequence MR imaging of the lumbar spine was performed. No intravenous contrast was  administered. COMPARISON:  Lumbar radiographs 01/31/2017. CT Abdomen and Pelvis 02/12/2016 FINDINGS: Segmentation: Normal as seen on the comparison CT Abdomen and Pelvis. Alignment: Stable and essentially normal vertebral height and alignment compared to 2017. Vertebrae: Visualized bone marrow signal is within normal limits. No marrow edema or evidence of acute osseous abnormality. Visible sacral ala and SI joints appear normal and intact. Conus medullaris: Extends to the T12-L1 level and appears normal. Paraspinal and other soft tissues: Confluent abnormal bilateral erector spinae muscle edema, mostly from the L3 level to the sacrum (series 5 images 9 on the right and images 14 and 15 on the left). No discrete intramuscular fluid or fluid collection. The psoas muscles are normal. The lumbar epidural space is normal. There is nonspecific perinephric stranding and trace fluid which on the right does abut the superior right psoas muscle. Partially visible chronic renal atrophy. No retroperitoneal lymphadenopathy. Disc levels: T11-T12:  Negative. T12-L1:  Negative. L1-L2: Mild circumferential disc bulge with small left lateral recess disc protrusion (series 6, image 5). No significant stenosis. L2-L3: Mostly far lateral disc bulging. Mild facet hypertrophy. No significant stenosis. L3-L4: Circumferential disc bulge with broad-based posterior component. Mild to moderate facet and left ligament flavum hypertrophy. No significant stenosis. L4-L5: Circumferential disc bulge with broad-based posterior component. Left paracentral annular fissure (series 6, image 25). Moderate facet and ligament flavum hypertrophy. Mild to moderate spinal and left greater than right lateral recess stenosis. Mild bilateral L4 foraminal stenosis. L5-S1: Mild mostly far lateral disc bulging. Epidural lipomatosis  which largely effaces CSF from the thecal sac at this level. No other stenosis. IMPRESSION: 1. Confluent edema in the bilateral lower lumbar and upper sacral erector spinae muscles, indicating nonspecific myositis. This could be posttraumatic or inflammatory. There is no associated muscle abscess. 2. No acute or inflammatory changes identified in the lumbar spine. 3. Lumbar spine degeneration resulting in multifactorial mild to moderate spinal and left greater than right lateral recess stenosis at L4-L5. Electronically Signed   By: Genevie Ann M.D.   On: 02/01/2017 15:14   US Venous Img Lower Unilateral Left  Result Date: 02/01/2017 CLINICAL DATA:  LEFT leg swelling for 6 months EXAM: LEFT LOWER EXTREMITY VENOUS DOPPLER ULTRASOUND TECHNIQUE: Gray-scale sonography with graded compression, as well as color Doppler and duplex ultrasound were performed to evaluate the lower extremity deep venous systems from the level of the common femoral vein and including the common femoral, femoral, profunda femoral, popliteal and calf veins including the posterior tibial, peroneal and gastrocnemius veins when visible. The superficial great saphenous vein was also interrogated. Spectral Doppler was utilized to evaluate flow at rest and with distal augmentation maneuvers in the common femoral, femoral and popliteal veins. COMPARISON:  05/27/2015 FINDINGS: Contralateral Common Femoral Vein: Respiratory phasicity is normal and symmetric with the symptomatic side. No evidence of thrombus. Normal compressibility. Common Femoral Vein: No evidence of thrombus. Normal compressibility, respiratory phasicity and response to augmentation. Saphenofemoral Junction: No evidence of thrombus. Normal compressibility and flow on color Doppler imaging. Profunda Femoral Vein: No evidence of thrombus. Normal compressibility and flow on color Doppler imaging. Femoral Vein: No evidence of thrombus. Normal compressibility, respiratory phasicity and  response to augmentation. Popliteal Vein: No evidence of thrombus. Normal compressibility, respiratory phasicity and response to augmentation. Calf Veins: No evidence of thrombus. Normal compressibility and flow on color Doppler imaging. Superficial Great Saphenous Vein: No evidence of thrombus. Normal compressibility and flow on color Doppler imaging. Venous Reflux:  None. Other Findings:  None. IMPRESSION: No  evidence of deep venous thrombosis in the LEFT lower extremity. Electronically Signed   By: Lavonia Dana M.D.   On: 02/01/2017 18:17    EKG:   Orders placed or performed during the hospital encounter of 01/30/17  . EKG 12-Lead  . EKG 12-Lead      Management plans discussed with the patient, family and they are in agreement.  CODE STATUS:     Code Status Orders        Start     Ordered   01/31/17 0235  Full code  Continuous     01/31/17 0236    Code Status History    Date Active Date Inactive Code Status Order ID Comments User Context   This patient has a current code status but no historical code status.      TOTAL TIME TAKING CARE OF THIS PATIENT: 40 minutes.    Theodoro Grist M.D on 02/02/2017 at 1:40 PM  Between 7am to 6pm - Pager - [phone]  After 6pm go to [email or web address] - password [email or web address] [facility]  Office  [phone]  CC: Primary care physician; [name], MD

## 2017-02-02 NOTE — Care Management Note (Signed)
Case Management Note  Patient Details  Name: Martin Gilbert MRN: [id number] Date of Birth: 04-16-1941  Subjective/Objective:   Discussed discharge planning with daughter Martin Gilbert at [phone]. Daughter Martin Gilbert was provided with the name and phone number of [facility]. A referral for HH-PT was called to [place] at [facility] with a note that all calls for appointments should go to daughter Martin Gilbert. Martin Gilbert is aware that a RW has been requested from [facility] in [place] to be delivered to Martin Gilbert room today.                Action/Plan:   Expected Discharge Date:  02/02/17               Expected Discharge Plan:     In-House Referral:     Discharge planning Services     Post Acute Care Choice:    Choice offered to:     DME Arranged:    DME Agency:     HH Arranged:    HH Agency:     Status of Service:     If discussed at [employer] [facility] [employer], dates discussed:    Additional Comments:  Tarris Delbene A, RN 02/02/2017, 2:05 PM

## 2017-02-02 NOTE — Progress Notes (Signed)
PT Cancellation Note  Patient Details Name: Martin Gilbert MRN: [id number] DOB: 1941-07-20   Cancelled Treatment:    Reason Eval/Treat Not Completed: Other (comment) (Declined since he has been up walking alone with RW).  Leaving orders open per PT judgment about family deciding together and pt declining therapy.  May change his mind, will check again tomorrow.   Ramond Dial 02/02/2017, 9:40 AM   Mee Hives, PT MS Acute Rehab Dept. Number: [place] and [place]

## 2017-02-02 NOTE — Progress Notes (Signed)
Pt has inspiratory and expiratory wheezing this morning, MD Pyreddy made aware, will administer albuterol as prescribed by MD, Pt is still c/o Nausea and is still vomiting small amounts, antiemetics have been administered. Will continue to monitor pt.

## 2017-02-02 NOTE — Progress Notes (Signed)
Pharmacy Antibiotic Note  Martin Gilbert is a 76 y.o. male admitted on 01/30/2017 with UTI.  Pharmacy has been consulted for ceftriaxone dosing.  Plan: Ceftriaxone 1 gm IV Q24H. Urine Culture Sensitive to Cefazolin. Please consider narrowing therapy.   Height: 5\' 7"  (170.2 cm) Weight: 176 lb 14.4 oz (80.2 kg) IBW/kg (Calculated) : 66.1  Temp (24hrs), Avg:98.3 F (36.8 C), Min:98.1 F (36.7 C), Max:98.7 F (37.1 C)   Recent Labs Lab 01/30/17 2112 01/31/17 0627 02/01/17 0524 02/02/17 0547  WBC 21.7* 25.9* 19.8* 9.6  CREATININE 6.34* 6.00* 6.22* 6.22*  LATICACIDVEN 0.9  --   --   --     Estimated Creatinine Clearance: 10.2 mL/min (by C-G formula based on SCr of 6.22 mg/dL (H)).    Allergies  Allergen Reactions  . Acetaminophen Hives    Possible lip swelling.  Patient is not sure.  09/01/16- patient denies allergy  . Aspirin Rash    09/01/16- patient denies allergy   Antibiotics: Ceftriaxone 2/15 >>  Microbiology: 2/15 UCx: Ecoli - Sensitive to ceftriaxone, cefazolin  2/15 BCx: no growth to date.    Pharmacy will continue to monitor and adjust per consult.  Simpson,Michael L 02/02/2017 12:48 PM

## 2017-02-02 NOTE — Progress Notes (Signed)
Discharge paperwork reviewed with patient and his family. Interpreter is at the bedside. Information regarding follow-up appointments, medications and education for all newly prescribed meds was provided, all questions answered. Peripheral IV removed, catheter intact. Heart monitor removed, returned to the nurse station. Transport requested via wheelchair to the lobby for discharge.

## 2017-02-02 NOTE — Evaluation (Signed)
Physical Therapy Evaluation Patient Details Name: Martin Gilbert MRN: [id number] DOB: 12/05/1941 Today's Date: 02/02/2017   History of Present Illness  76 yo male with recent fall was admitted, has sepsi from UTI and PMHx:  CKD 5, DM, chronic back pain.  He has been on and off RW for a 6 month period due to joint pain in knees.  Clinical Impression  Pt is up to walk with PT and noted some instability which RW and careful supervision manages.  He is with family who translate due to issues of translation service being able to help with this today.  He has ranked his foot and knee pain an 8 and used some limiting of distance to avoid overstressing his legs.  Will continue on acutely as he is here to try steps next visit if able, then will transition home with RW and HHPT for more strengthening.    Follow Up Recommendations Home health PT;Supervision for mobility/OOB    Equipment Recommendations  Rolling walker with 5" wheels    Recommendations for Other Services       Precautions / Restrictions Precautions Precautions: Fall (telemetry) Restrictions Weight Bearing Restrictions: No      Mobility  Bed Mobility Overal bed mobility: Needs Assistance Bed Mobility: Supine to Sit;Sit to Supine     Supine to sit: Min assist Sit to supine: Min assist      Transfers Overall transfer level: Needs assistance Equipment used: Rolling walker (2 wheeled);1 person hand held assist Transfers: Sit to/from [facility] Sit to Stand: Min guard Stand pivot transfers: Min guard       General transfer comment: able to stand from the commode  Ambulation/Gait Ambulation/Gait assistance: Min guard Ambulation Distance (Feet): 150 Feet Assistive device: Rolling walker (2 wheeled);1 person hand held assist Gait Pattern/deviations: Step-through pattern;Wide base of support;Decreased stride length Gait velocity: reduced Gait velocity interpretation: Below normal speed for  age/gender General Gait Details: used gait belt and controlled pace to reduce his need for support  Stairs            Wheelchair Mobility    Modified Rankin (Stroke Patients Only)       Balance Overall balance assessment: Needs assistance;History of Falls Sitting-balance support: Feet supported Sitting balance-Leahy Scale: Good     Standing balance support: Bilateral upper extremity supported Standing balance-Leahy Scale: Fair                               Pertinent Vitals/Pain Pain Assessment: 0-10 Pain Score: 8  Pain Location: B knees and bottoms of feet esp L foot Pain Descriptors / Indicators: Sore;Tender Pain Intervention(s): Limited activity within patient's tolerance;Monitored during session;Repositioned    Home Living Family/patient expects to be discharged to:: Private residence Living Arrangements: Spouse/significant other Available Help at Discharge: Family;Available 24 hours/day Type of Home: House Home Access: Stairs to enter Entrance Stairs-Rails: Right Entrance Stairs-Number of Steps: 3 Home Layout: One level Home Equipment: Walker - standard Additional Comments: pt dislikes the std walker and is difficult to use    Prior Function Level of Independence: Independent with assistive device(s)               Hand Dominance   Dominant Hand: Right    Extremity/Trunk Assessment   Upper Extremity Assessment Upper Extremity Assessment: Overall WFL for tasks assessed    Lower Extremity Assessment Lower Extremity Assessment: Overall WFL for tasks assessed    Cervical /  Trunk Assessment Cervical / Trunk Assessment: Normal  Communication   Communication: Prefers language other than English  Cognition Arousal/Alertness: Awake/alert Behavior During Therapy: WFL for tasks assessed/performed Overall Cognitive Status: Within Functional Limits for tasks assessed                      General Comments      Exercises      Assessment/Plan    PT Assessment Patient needs continued PT services  PT Problem List Decreased strength;Decreased range of motion;Decreased activity tolerance;Decreased balance;Decreased mobility;Decreased coordination;Decreased knowledge of use of DME;Decreased safety awareness;Cardiopulmonary status limiting activity;Decreased skin integrity;Pain          PT Treatment Interventions DME instruction;Stair training;Gait training;Functional mobility training;Therapeutic activities;Therapeutic exercise;Balance training;Neuromuscular re-education;Patient/family education    PT Goals (Current goals can be found in the Care Plan section)  Acute Rehab PT Goals Patient Stated Goal: to get his pain managed (via translation) PT Goal Formulation: With patient/family Time For Goal Achievement: 02/16/17 Potential to Achieve Goals: Good    Frequency Min 2X/week   Barriers to discharge Inaccessible home environment has 3 steps to enter house    Co-evaluation               End of Session Equipment Utilized During Treatment: Gait belt Activity Tolerance: Patient tolerated treatment well;Patient limited by fatigue;Patient limited by pain (foot and knee pain) Patient left: in bed;with call bell/phone within reach;with family/visitor present;with nursing/sitter in room (MD in to discuss his care and discharge needs) Nurse Communication: Mobility status         Time: 6599-3570 PT Time Calculation (min) (ACUTE ONLY): 23 min   Charges:   PT Evaluation $PT Eval Low Complexity: 1 Procedure PT Treatments $Gait Training: 8-22 mins   PT G Codes:        Ramond Dial 2017/03/01, 12:31 PM  Mee Hives, PT MS Acute Rehab Dept. Number: [place] and [place]

## 2017-02-02 NOTE — Progress Notes (Signed)
Patient/family declining alarms while family is at bedside. Agrees to call for assistance and let staff know when family leaves so alarms can be turned on.

## 2017-02-03 ENCOUNTER — Encounter: Payer: Self-pay | Admitting: Emergency Medicine

## 2017-02-03 ENCOUNTER — Emergency Department
Admission: EM | Admit: 2017-02-03 | Discharge: 2017-02-03 | Disposition: A | Discharge status: Home / Self Care | Payer: Medicare Other | Attending: Emergency Medicine | Admitting: Emergency Medicine

## 2017-02-03 DIAGNOSIS — Z794 Long term (current) use of insulin: Secondary | ICD-10-CM | POA: Diagnosis not present

## 2017-02-03 DIAGNOSIS — I129 Hypertensive chronic kidney disease with stage 1 through stage 4 chronic kidney disease, or unspecified chronic kidney disease: Secondary | ICD-10-CM | POA: Diagnosis not present

## 2017-02-03 DIAGNOSIS — E1122 Type 2 diabetes mellitus with diabetic chronic kidney disease: Secondary | ICD-10-CM | POA: Diagnosis not present

## 2017-02-03 DIAGNOSIS — N184 Chronic kidney disease, stage 4 (severe): Secondary | ICD-10-CM | POA: Insufficient documentation

## 2017-02-03 DIAGNOSIS — Z79899 Other long term (current) drug therapy: Secondary | ICD-10-CM | POA: Insufficient documentation

## 2017-02-03 DIAGNOSIS — Z7982 Long term (current) use of aspirin: Secondary | ICD-10-CM | POA: Diagnosis not present

## 2017-02-03 DIAGNOSIS — R339 Retention of urine, unspecified: Secondary | ICD-10-CM | POA: Insufficient documentation

## 2017-02-03 LAB — CBC
HEMATOCRIT: 22.9 % — AB (ref 40.0–52.0)
HEMOGLOBIN: 7.7 g/dL — AB (ref 13.0–18.0)
MCH: 31.3 pg (ref 26.0–34.0)
MCHC: 33.7 g/dL (ref 32.0–36.0)
MCV: 92.7 fL (ref 80.0–100.0)
Platelets: 204 10*3/uL (ref 150–440)
RBC: 2.47 MIL/uL — ABNORMAL LOW (ref 4.40–5.90)
RDW: 14.3 % (ref 11.5–14.5)
WBC: 7.7 10*3/uL (ref 3.8–10.6)

## 2017-02-03 LAB — BASIC METABOLIC PANEL
Anion gap: 8 (ref 5–15)
BUN: 68 mg/dL — AB (ref 6–20)
CHLORIDE: 114 mmol/L — AB (ref 101–111)
CO2: 19 mmol/L — AB (ref 22–32)
CREATININE: 6.76 mg/dL — AB (ref 0.61–1.24)
Calcium: 8.2 mg/dL — ABNORMAL LOW (ref 8.9–10.3)
GFR calc Af Amer: 8 mL/min — ABNORMAL LOW (ref >60)
GFR calc non Af Amer: 7 mL/min — ABNORMAL LOW (ref >60)
GLUCOSE: 107 mg/dL — AB (ref 65–99)
Potassium: 4.3 mmol/L (ref 3.5–5.1)
Sodium: 141 mmol/L (ref 135–145)

## 2017-02-03 LAB — URINALYSIS, COMPLETE (UACMP) WITH MICROSCOPIC
BILIRUBIN URINE: NEGATIVE
Glucose, UA: 50 mg/dL — AB
Ketones, ur: NEGATIVE mg/dL
Leukocytes, UA: NEGATIVE
Nitrite: NEGATIVE
Protein, ur: >=300 mg/dL — AB
SPECIFIC GRAVITY, URINE: 1.011 (ref 1.005–1.030)
pH: 6 (ref 5.0–8.0)

## 2017-02-03 LAB — GLUCOSE, CAPILLARY: GLUCOSE-CAPILLARY: 134 mg/dL — AB (ref 65–99)

## 2017-02-03 NOTE — ED Notes (Signed)
Pt repositioned in bed for comfort. Family at bedside.

## 2017-02-03 NOTE — ED Triage Notes (Addendum)
Daughter says pt was discharged yesterday after being admitted for 3 days for sepsis; she says pt has a urinary tract infection and is now unable to void; last voided some time yesterday; pt skin tone dusky;

## 2017-02-03 NOTE — ED Notes (Signed)
Pt fitted with leg bag. Daughter instructed on proper leg bag drainage and s/s of uti.

## 2017-02-03 NOTE — ED Notes (Signed)
Pt bladder scanned with 249ml present in bladder. Pt complains of pelvic pain and inablity to urinate since 1700 yesterday.

## 2017-02-03 NOTE — ED Provider Notes (Signed)
[facility] Emergency Department Provider Note   ____________________________________________   First MD Initiated Contact with Patient 02/03/17 [phone]     (approximate)  I have reviewed the triage vital signs and the nursing notes.   HISTORY  Chief Complaint Urinary Retention    HPI Martin Gilbert is a 76 y.o. male who comes into the hospital today with difficulty urinating. The patient last urinated about yesterday afternoon. He denies any back pain but has had some left-sided abdominal pain. He fell on Thursday and was seen here in the hospital and admitted for 2 days. He was sent home yesterday. He had a UTI and is currently taking an antibiotic. He has not had any fevers. He has had a catheter in the past and followed up with a doctor in [place]. The patient is here today for evaluation and treatment. He's had some lower abdominal pain.   Past Medical History:  Diagnosis Date  . Back pain   . BPH (benign prostatic hyperplasia)   . CKD (chronic kidney disease), stage IV ( [place])   . Diabetes mellitus without complication ([place])   . HTN (hypertension)   . Hyperlipidemia     Patient Active Problem List   Diagnosis Date Noted  . UTI (urinary tract infection) 02/02/2017  . E coli infection 02/02/2017  . Lactic acidosis 02/02/2017  . Diabetic neuropathy ([place]) 02/02/2017  . Left-sided chest wall pain 02/02/2017  . Fall 02/02/2017  . Generalized weakness 02/02/2017  . Pink eye, left 02/02/2017  . Leukocytosis 02/02/2017  . Anemia of chronic disease 02/02/2017  . Nausea 02/02/2017  . Sepsis ([place]) 01/31/2017  . Essential hypertension 11/28/2015  . Pain in the chest 02/15/2015  . Neck pain 02/15/2015  . Diabetes type 2, uncontrolled ([place]) 02/15/2015  . Chronic kidney disease, stage IV (severe) ([place]) 02/15/2015  . Frequent headaches 02/15/2015    Past Surgical History:  Procedure Laterality Date  . hernia repair      Prior to  Admission medications   Medication Sig Start Date End Date Taking? Authorizing Provider  acetaminophen (TYLENOL) 500 MG tablet Take 500 mg by mouth every 8 (eight) hours as needed.    Historical Provider, MD  allopurinol (ZYLOPRIM) 100 MG tablet Take 100 mg by mouth daily. 10/17/15   Historical Provider, MD  aspirin 81 MG tablet Take 81 mg by mouth daily.    Historical Provider, MD  atorvastatin (LIPITOR) 20 MG tablet Take 20 mg by mouth daily.    Historical Provider, MD  baclofen (LIORESAL) 10 MG tablet Take 1 tablet (10 mg total) by mouth 3 (three) times daily. Patient not taking: Reported on 01/30/2017 05/27/16   [name], PA-C  carbamide peroxide (DEBROX) 6.5 % otic solution Place 5 drops into the right ear 2 (two) times daily. Patient not taking: Reported on 01/30/2017 05/27/16 05/27/17  [name], PA-C  cephALEXin (KEFLEX) 500 MG capsule Take 1 capsule (500 mg total) by mouth 2 (two) times daily. 02/02/17   Theodoro Grist, MD  ciprofloxacin (CILOXAN) 0.3 % ophthalmic solution Place 1 drop into the left eye every 4 (four) hours while awake. Administer 1 drop, every 2 hours, while awake, for 2 days. Then 1 drop, every 4 hours, while awake, for the next 5 days. 02/02/17   Theodoro Grist, MD  doxazosin (CARDURA) 2 MG tablet Take 2 mg by mouth daily.    Historical Provider, MD  feeding supplement, GLUCERNA SHAKE, (GLUCERNA SHAKE) LIQD Take 237 mLs by mouth 3 (  three) times daily between meals. 02/02/17   Theodoro Grist, MD  ferrous sulfate 325 (65 FE) MG tablet Take 325 mg by mouth daily with breakfast. 08/30/15   Historical Provider, MD  furosemide (LASIX) 40 MG tablet Take 40 mg by mouth daily. 10/17/15   Historical Provider, MD  gabapentin (NEURONTIN) 100 MG capsule Take 1 capsule (100 mg total) by mouth every 8 (eight) hours as needed (tingling or numbness in feet). 02/02/17   Theodoro Grist, MD  glipiZIDE (GLUCOTROL) 5 MG tablet Take 5 mg by mouth daily. 12/27/16   Historical Provider, MD    hydrALAZINE (APRESOLINE) 50 MG tablet Take 50 mg by mouth 3 (three) times daily.    Historical Provider, MD  Multiple Vitamin (MULTI-VITAMINS) TABS Take 1 tablet by mouth daily.    Historical Provider, MD  ondansetron (ZOFRAN) 4 MG tablet Take 1 tablet (4 mg total) by mouth every 6 (six) hours as needed for nausea. 02/02/17   Theodoro Grist, MD  oxyCODONE (ROXICODONE) 5 MG immediate release tablet Take 1 tablet (5 mg total) by mouth every 8 (eight) hours as needed. Patient not taking: Reported on 01/30/2017 09/01/16 09/01/17  [name], MD  pantoprazole (PROTONIX) 40 MG tablet Take 1 tablet (40 mg total) by mouth daily. 02/03/17   Theodoro Grist, MD  senna-docusate (SENOKOT-S) 8.6-50 MG tablet Take 1 tablet by mouth at bedtime as needed for mild constipation. 02/02/17   Theodoro Grist, MD  sodium bicarbonate 650 MG tablet Take 650 mg by mouth 2 (two) times daily.     Historical Provider, MD  Vitamin D, Cholecalciferol, 1000 UNITS TABS Take by mouth.    Historical Provider, MD    Allergies Acetaminophen and Aspirin  Family History  Problem Relation Age of Onset  . Family history unknown: Yes    Social History Social History  Substance Use Topics  . Smoking status: Never Smoker  . Smokeless tobacco: Never Used  . Alcohol use No     Comment: Drank in the past, but has stopped for several years.     Review of Systems Constitutional: No fever/chills Eyes: No visual changes. ENT: No sore throat. Cardiovascular: Denies chest pain. Respiratory: Denies shortness of breath. Gastrointestinal: abdominal pain.  No nausea, no vomiting.  No diarrhea.  No constipation. Genitourinary: Inability to urinate Musculoskeletal: Negative for back pain. Skin: Negative for rash. Neurological: Negative for headaches, focal weakness or numbness.  10-point ROS otherwise negative.  ____________________________________________   PHYSICAL EXAM:  VITAL SIGNS: ED Triage Vitals  Enc Vitals Group      BP 02/03/17 0315 (!) 175/97     Pulse Rate 02/03/17 0315 (!) 107     Resp 02/03/17 0315 20     Temp 02/03/17 0315 98.3 F (36.8 C)     Temp Source 02/03/17 0315 Oral     SpO2 02/03/17 0315 97 %     Weight 02/03/17 0310 176 lb (79.8 kg)     Height 02/03/17 0310 5\' 7"  (1.702 m)     Head Circumference --      Peak Flow --      Pain Score --      Pain Loc --      Pain Edu? --      Excl. in [place]? --     Constitutional: Alert and oriented. Well appearing and in no acute distress. Eyes: Conjunctivae are normal. PERRL. EOMI. Head: Atraumatic. Nose: No congestion/rhinnorhea. Mouth/Throat: Mucous membranes are moist.  Oropharynx non-erythematous. Cardiovascular: Normal rate, regular rhythm. Grossly  normal heart sounds.  Good peripheral circulation. Respiratory: Normal respiratory effort.  No retractions. Lungs CTAB. Gastrointestinal: Soft and nontender. No distention. Positive bowel sounds Musculoskeletal: No lower extremity tenderness nor edema.   Neurologic:  Normal speech and language.  Skin:  Skin is warm, dry and intact.  Psychiatric: Mood and affect are normal.   ____________________________________________   LABS (all labs ordered are listed, but only abnormal results are displayed)  Labs Reviewed  CBC - Abnormal; Notable for the following:       Result Value   RBC 2.47 (*)    Hemoglobin 7.7 (*)    HCT 22.9 (*)    All other components within normal limits  BASIC METABOLIC PANEL - Abnormal; Notable for the following:    Chloride 114 (*)    CO2 19 (*)    Glucose, Bld 107 (*)    BUN 68 (*)    Creatinine, Ser 6.76 (*)    Calcium 8.2 (*)    GFR calc non Af Amer 7 (*)    GFR calc Af Amer 8 (*)    All other components within normal limits  URINALYSIS, COMPLETE (UACMP) WITH MICROSCOPIC - Abnormal; Notable for the following:    Color, Urine YELLOW (*)    APPearance HAZY (*)    Glucose, UA 50 (*)    Hgb urine dipstick SMALL (*)    Protein, ur >=300 (*)    Bacteria, UA RARE  (*)    Squamous Epithelial / LPF 0-5 (*)    All other components within normal limits   ____________________________________________  EKG  none ____________________________________________  RADIOLOGY  none ____________________________________________   PROCEDURES  Procedure(s) performed: None  Procedures  Critical Care performed: No  ____________________________________________   INITIAL IMPRESSION / ASSESSMENT AND PLAN / ED COURSE  Pertinent labs & imaging results that were available during my care of the patient were reviewed by me and considered in my medical decision making (see chart for details).  This is a 76 year old male who comes into the hospital today with urinary retention. The patient had a Foley placed and his discomfort was improved. We did check some blood work and the patient does have a creatinine that 6.76. The patient's urine doesn't show any sign of infection and his electrolytes are the same as they were when he was previously in the hospital. The patient's hemoglobin has gone from 8.1-7.7 but again he does need follow-up. The patient will be discharged to home to follow-up      ____________________________________________   FINAL CLINICAL IMPRESSION(S) / ED DIAGNOSES  Final diagnoses:  Urinary retention      NEW MEDICATIONS STARTED DURING THIS VISIT:  New Prescriptions   No medications on file     Note:  This document was prepared using Dragon voice recognition software and may include unintentional dictation errors.    Loney Hering, MD 02/03/17 [phone]

## 2017-02-04 LAB — CULTURE, BLOOD (ROUTINE X 2)
CULTURE: NO GROWTH
Culture: NO GROWTH

## 2017-02-05 DIAGNOSIS — T83098A Other mechanical complication of other indwelling urethral catheter, initial encounter: Secondary | ICD-10-CM | POA: Diagnosis not present

## 2017-02-05 DIAGNOSIS — Z7982 Long term (current) use of aspirin: Secondary | ICD-10-CM | POA: Insufficient documentation

## 2017-02-05 DIAGNOSIS — Y828 Other medical devices associated with adverse incidents: Secondary | ICD-10-CM | POA: Insufficient documentation

## 2017-02-05 DIAGNOSIS — Z79899 Other long term (current) drug therapy: Secondary | ICD-10-CM | POA: Diagnosis not present

## 2017-02-05 DIAGNOSIS — N184 Chronic kidney disease, stage 4 (severe): Secondary | ICD-10-CM | POA: Diagnosis not present

## 2017-02-05 DIAGNOSIS — R339 Retention of urine, unspecified: Secondary | ICD-10-CM | POA: Insufficient documentation

## 2017-02-05 DIAGNOSIS — I129 Hypertensive chronic kidney disease with stage 1 through stage 4 chronic kidney disease, or unspecified chronic kidney disease: Secondary | ICD-10-CM | POA: Insufficient documentation

## 2017-02-05 DIAGNOSIS — E1122 Type 2 diabetes mellitus with diabetic chronic kidney disease: Secondary | ICD-10-CM | POA: Diagnosis not present

## 2017-02-05 NOTE — ED Triage Notes (Signed)
Pt had catheter placed Sunday for urinary retention and had catheter placed. States catheter accidentally got pulled out 30 min pta, here for replacement.

## 2017-02-05 NOTE — ED Triage Notes (Signed)
Pt speaks a dialect no interpreter available, daughter able to interpret effectively.

## 2017-02-06 ENCOUNTER — Emergency Department
Admission: EM | Admit: 2017-02-06 | Discharge: 2017-02-06 | Disposition: A | Discharge status: Home / Self Care | Payer: Medicare Other | Attending: Emergency Medicine | Admitting: Emergency Medicine

## 2017-02-06 DIAGNOSIS — R339 Retention of urine, unspecified: Secondary | ICD-10-CM

## 2017-02-06 DIAGNOSIS — T83098A Other mechanical complication of other indwelling urethral catheter, initial encounter: Secondary | ICD-10-CM | POA: Diagnosis not present

## 2017-02-06 DIAGNOSIS — T839XXA Unspecified complication of genitourinary prosthetic device, implant and graft, initial encounter: Secondary | ICD-10-CM

## 2017-02-06 LAB — URINALYSIS, ROUTINE W REFLEX MICROSCOPIC
BILIRUBIN URINE: NEGATIVE
Glucose, UA: NEGATIVE mg/dL
Ketones, ur: NEGATIVE mg/dL
NITRITE: NEGATIVE
Protein, ur: 100 mg/dL — AB
SPECIFIC GRAVITY, URINE: 1.006 (ref 1.005–1.030)
Squamous Epithelial / LPF: NONE SEEN
pH: 6 (ref 5.0–8.0)

## 2017-02-06 NOTE — Discharge Instructions (Signed)
A urine culture is pending, and you will be contacted by a nurse if you need antibiotics.  Please follow up with your urologist as scheduled.

## 2017-02-06 NOTE — ED Provider Notes (Signed)
[facility] Emergency Department Provider Note  ____________________________________________   First MD Initiated Contact with Patient 02/06/17 0102     (approximate)  I have reviewed the triage vital signs and the nursing notes.   HISTORY  Chief Complaint Urinary Retention  The patient speaks a unique dialect which is not available through the interpreter line.  His daughter serves as the interpreter.  HPI Martin Gilbert is a 76 y.o. male with a recent history of acute urinary retention with an indwelling Foley catheter with plans to follow up with urology in about 2 weeks.  He presents tonight with his family because as he was moving around his catheter came out and they need it replaced.  He reports that it burns when he urinates since getting the Foley but now he feels some pain because of the pressure because he has only been able to urinate a small trickle since the Foley came out.  He denies fever/chills, chest pain, shortness of breath, nausea, vomiting.  The mouth function of the Foley occurred acutely and nothing makes it better nor worse.   Past Medical History:  Diagnosis Date  . Back pain   . BPH (benign prostatic hyperplasia)   . CKD (chronic kidney disease), stage IV ([place])   . Diabetes mellitus without complication ([place])   . HTN (hypertension)   . Hyperlipidemia     Patient Active Problem List   Diagnosis Date Noted  . UTI (urinary tract infection) 02/02/2017  . E coli infection 02/02/2017  . Lactic acidosis 02/02/2017  . Diabetic neuropathy ([place]) 02/02/2017  . Left-sided chest wall pain 02/02/2017  . Fall 02/02/2017  . Generalized weakness 02/02/2017  . Pink eye, left 02/02/2017  . Leukocytosis 02/02/2017  . Anemia of chronic disease 02/02/2017  . Nausea 02/02/2017  . Sepsis ([place]) 01/31/2017  . Essential hypertension 11/28/2015  . Pain in the chest 02/15/2015  . Neck pain 02/15/2015  . Diabetes type 2, uncontrolled  ([place]) 02/15/2015  . Chronic kidney disease, stage IV (severe) ([place]) 02/15/2015  . Frequent headaches 02/15/2015    Past Surgical History:  Procedure Laterality Date  . hernia repair      Prior to Admission medications   Medication Sig Start Date End Date Taking? Authorizing Provider  acetaminophen (TYLENOL) 500 MG tablet Take 500 mg by mouth every 8 (eight) hours as needed.    Historical Provider, MD  allopurinol (ZYLOPRIM) 100 MG tablet Take 100 mg by mouth daily. 10/17/15   Historical Provider, MD  aspirin 81 MG tablet Take 81 mg by mouth daily.    Historical Provider, MD  atorvastatin (LIPITOR) 20 MG tablet Take 20 mg by mouth daily.    Historical Provider, MD  baclofen (LIORESAL) 10 MG tablet Take 1 tablet (10 mg total) by mouth 3 (three) times daily. Patient not taking: Reported on 01/30/2017 05/27/16   [name], PA-C  carbamide peroxide (DEBROX) 6.5 % otic solution Place 5 drops into the right ear 2 (two) times daily. Patient not taking: Reported on 01/30/2017 05/27/16 05/27/17  [name], PA-C  cephALEXin (KEFLEX) 500 MG capsule Take 1 capsule (500 mg total) by mouth 2 (two) times daily. 02/02/17   Theodoro Grist, MD  ciprofloxacin (CILOXAN) 0.3 % ophthalmic solution Place 1 drop into the left eye every 4 (four) hours while awake. Administer 1 drop, every 2 hours, while awake, for 2 days. Then 1 drop, every 4 hours, while awake, for the next 5 days. 02/02/17  Theodoro Grist, MD  doxazosin (CARDURA) 2 MG tablet Take 2 mg by mouth daily.    Historical Provider, MD  feeding supplement, GLUCERNA SHAKE, (GLUCERNA SHAKE) LIQD Take 237 mLs by mouth 3 (three) times daily between meals. 02/02/17   Theodoro Grist, MD  ferrous sulfate 325 (65 FE) MG tablet Take 325 mg by mouth daily with breakfast. 08/30/15   Historical Provider, MD  furosemide (LASIX) 40 MG tablet Take 40 mg by mouth daily. 10/17/15   Historical Provider, MD  gabapentin (NEURONTIN) 100 MG capsule Take 1 capsule (100 mg total)  by mouth every 8 (eight) hours as needed (tingling or numbness in feet). 02/02/17   Theodoro Grist, MD  glipiZIDE (GLUCOTROL) 5 MG tablet Take 5 mg by mouth daily. 12/27/16   Historical Provider, MD  hydrALAZINE (APRESOLINE) 50 MG tablet Take 50 mg by mouth 3 (three) times daily.    Historical Provider, MD  Multiple Vitamin (MULTI-VITAMINS) TABS Take 1 tablet by mouth daily.    Historical Provider, MD  ondansetron (ZOFRAN) 4 MG tablet Take 1 tablet (4 mg total) by mouth every 6 (six) hours as needed for nausea. 02/02/17   Theodoro Grist, MD  oxyCODONE (ROXICODONE) 5 MG immediate release tablet Take 1 tablet (5 mg total) by mouth every 8 (eight) hours as needed. Patient not taking: Reported on 01/30/2017 09/01/16 09/01/17  [name], MD  pantoprazole (PROTONIX) 40 MG tablet Take 1 tablet (40 mg total) by mouth daily. 02/03/17   Theodoro Grist, MD  senna-docusate (SENOKOT-S) 8.6-50 MG tablet Take 1 tablet by mouth at bedtime as needed for mild constipation. 02/02/17   Theodoro Grist, MD  sodium bicarbonate 650 MG tablet Take 650 mg by mouth 2 (two) times daily.     Historical Provider, MD  Vitamin D, Cholecalciferol, 1000 UNITS TABS Take by mouth.    Historical Provider, MD    Allergies Acetaminophen and Aspirin  Family History  Problem Relation Age of Onset  . Family history unknown: Yes    Social History Social History  Substance Use Topics  . Smoking status: Never Smoker  . Smokeless tobacco: Never Used  . Alcohol use No     Comment: Drank in the past, but has stopped for several years.     Review of Systems Constitutional: No fever/chills Cardiovascular: Denies chest pain. Respiratory: Denies shortness of breath. Gastrointestinal: No abdominal pain.  No nausea, no vomiting.  No diarrhea.  No constipation. Genitourinary: +dysuria, +retention Musculoskeletal: Negative for back pain.  ____________________________________________   PHYSICAL EXAM:  VITAL SIGNS: ED Triage Vitals    Enc Vitals Group     BP 02/05/17 2312 (!) 166/72     Pulse Rate 02/05/17 2311 91     Resp 02/05/17 2311 18     Temp 02/05/17 2311 99 F (37.2 C)     Temp Source 02/05/17 2311 Oral     SpO2 02/05/17 2311 98 %     Weight 02/05/17 2311 190 lb (86.2 kg)     Height 02/05/17 2311 5\' 5"  (1.651 m)     Head Circumference --      Peak Flow --      Pain Score 02/05/17 2311 0     Pain Loc --      Pain Edu? --      Excl. in [place]? --     Constitutional: Alert and oriented. Well appearing and in no acute distress. Eyes: Conjunctivae are normal. PERRL. EOMI. Head: Atraumatic. Cardiovascular: Normal rate, regular rhythm. Good peripheral  circulation.  Respiratory: Normal respiratory effort.  No retractions.  Gastrointestinal: Soft and nontender. No distention.  Musculoskeletal: No lower extremity tenderness nor edema. No gross deformities of extremities. Neurologic:  No gross focal neurologic deficits are appreciated.  Skin:  Skin is warm, dry and intact. No rash noted.  ____________________________________________   LABS (all labs ordered are listed, but only abnormal results are displayed)  Labs Reviewed  URINALYSIS, ROUTINE W REFLEX MICROSCOPIC - Abnormal; Notable for the following:       Result Value   Color, Urine STRAW (*)    APPearance CLEAR (*)    Hgb urine dipstick SMALL (*)    Protein, ur 100 (*)    Leukocytes, UA LARGE (*)    Bacteria, UA RARE (*)    All other components within normal limits  URINE CULTURE   ____________________________________________  EKG  None - EKG not ordered by ED physician ____________________________________________  RADIOLOGY   No results found.  ____________________________________________   PROCEDURES  Procedure(s) performed:   Procedures   Critical Care performed: No ____________________________________________   INITIAL IMPRESSION / ASSESSMENT AND PLAN / ED COURSE  Pertinent labs & imaging results that were available  during my care of the patient were reviewed by me and considered in my medical decision making (see chart for details).  Foley replaced.  Sending UA and urine culture since he has had a Foley for a few days and his initial UA several days ago was not completely normal.  Anticipate d/c and outpatient follow up with Urology.   Clinical Course as of Feb 06 157  Thu Feb 06, 2017  0156 No clear indication of infection on UA given that he has had a Foley recently.  Will not treat empirically, advised family that a nurse will call if he needs antibiotics.  [CF]    Clinical Course User Index [CF] Hinda Kehr, MD    ____________________________________________  FINAL CLINICAL IMPRESSION(S) / ED DIAGNOSES  Final diagnoses:  Problem with Foley catheter, initial encounter [facility])  Urinary retention     MEDICATIONS GIVEN DURING THIS VISIT:  Medications - No data to display   NEW OUTPATIENT MEDICATIONS STARTED DURING THIS VISIT:  New Prescriptions   No medications on file    Modified Medications   No medications on file    Discontinued Medications   No medications on file     Note:  This document was prepared using Dragon voice recognition software and may include unintentional dictation errors.    Hinda Kehr, MD 02/06/17 [phone]

## 2017-02-07 LAB — URINE CULTURE
CULTURE: NO GROWTH
Special Requests: NORMAL

## 2017-02-15 ENCOUNTER — Emergency Department: Payer: Medicare Other

## 2017-02-15 ENCOUNTER — Encounter: Payer: Self-pay | Admitting: Emergency Medicine

## 2017-02-15 ENCOUNTER — Inpatient Hospital Stay
Admission: EM | Admit: 2017-02-15 | Discharge: 2017-02-19 | DRG: 371 | Disposition: A | Discharge status: Home / Self Care | Payer: Medicare Other | Attending: Internal Medicine | Admitting: Internal Medicine

## 2017-02-15 DIAGNOSIS — R109 Unspecified abdominal pain: Secondary | ICD-10-CM

## 2017-02-15 DIAGNOSIS — Z794 Long term (current) use of insulin: Secondary | ICD-10-CM

## 2017-02-15 DIAGNOSIS — A0472 Enterocolitis due to Clostridium difficile, not specified as recurrent: Secondary | ICD-10-CM | POA: Diagnosis present

## 2017-02-15 DIAGNOSIS — Z886 Allergy status to analgesic agent status: Secondary | ICD-10-CM

## 2017-02-15 DIAGNOSIS — R600 Localized edema: Secondary | ICD-10-CM | POA: Diagnosis present

## 2017-02-15 DIAGNOSIS — D631 Anemia in chronic kidney disease: Secondary | ICD-10-CM | POA: Diagnosis present

## 2017-02-15 DIAGNOSIS — E1122 Type 2 diabetes mellitus with diabetic chronic kidney disease: Secondary | ICD-10-CM | POA: Diagnosis present

## 2017-02-15 DIAGNOSIS — K7460 Unspecified cirrhosis of liver: Secondary | ICD-10-CM | POA: Diagnosis present

## 2017-02-15 DIAGNOSIS — E785 Hyperlipidemia, unspecified: Secondary | ICD-10-CM | POA: Diagnosis present

## 2017-02-15 DIAGNOSIS — E8809 Other disorders of plasma-protein metabolism, not elsewhere classified: Secondary | ICD-10-CM | POA: Diagnosis present

## 2017-02-15 DIAGNOSIS — M109 Gout, unspecified: Secondary | ICD-10-CM | POA: Diagnosis present

## 2017-02-15 DIAGNOSIS — E1121 Type 2 diabetes mellitus with diabetic nephropathy: Secondary | ICD-10-CM | POA: Diagnosis present

## 2017-02-15 DIAGNOSIS — N2581 Secondary hyperparathyroidism of renal origin: Secondary | ICD-10-CM | POA: Diagnosis present

## 2017-02-15 DIAGNOSIS — E1140 Type 2 diabetes mellitus with diabetic neuropathy, unspecified: Secondary | ICD-10-CM | POA: Diagnosis present

## 2017-02-15 DIAGNOSIS — Z936 Other artificial openings of urinary tract status: Secondary | ICD-10-CM

## 2017-02-15 DIAGNOSIS — Z87440 Personal history of urinary (tract) infections: Secondary | ICD-10-CM | POA: Diagnosis not present

## 2017-02-15 DIAGNOSIS — I120 Hypertensive chronic kidney disease with stage 5 chronic kidney disease or end stage renal disease: Secondary | ICD-10-CM | POA: Diagnosis present

## 2017-02-15 DIAGNOSIS — R338 Other retention of urine: Secondary | ICD-10-CM | POA: Diagnosis present

## 2017-02-15 DIAGNOSIS — N401 Enlarged prostate with lower urinary tract symptoms: Secondary | ICD-10-CM | POA: Diagnosis present

## 2017-02-15 DIAGNOSIS — N179 Acute kidney failure, unspecified: Secondary | ICD-10-CM | POA: Diagnosis present

## 2017-02-15 DIAGNOSIS — N400 Enlarged prostate without lower urinary tract symptoms: Secondary | ICD-10-CM | POA: Diagnosis present

## 2017-02-15 DIAGNOSIS — Z79899 Other long term (current) drug therapy: Secondary | ICD-10-CM

## 2017-02-15 DIAGNOSIS — K859 Acute pancreatitis without necrosis or infection, unspecified: Secondary | ICD-10-CM | POA: Diagnosis present

## 2017-02-15 DIAGNOSIS — R918 Other nonspecific abnormal finding of lung field: Secondary | ICD-10-CM

## 2017-02-15 DIAGNOSIS — E43 Unspecified severe protein-calorie malnutrition: Secondary | ICD-10-CM | POA: Diagnosis present

## 2017-02-15 DIAGNOSIS — E872 Acidosis: Secondary | ICD-10-CM | POA: Diagnosis present

## 2017-02-15 DIAGNOSIS — N185 Chronic kidney disease, stage 5: Secondary | ICD-10-CM | POA: Diagnosis present

## 2017-02-15 DIAGNOSIS — R509 Fever, unspecified: Secondary | ICD-10-CM

## 2017-02-15 DIAGNOSIS — Z7982 Long term (current) use of aspirin: Secondary | ICD-10-CM

## 2017-02-15 DIAGNOSIS — R197 Diarrhea, unspecified: Secondary | ICD-10-CM | POA: Diagnosis present

## 2017-02-15 LAB — URINALYSIS, COMPLETE (UACMP) WITH MICROSCOPIC
Bilirubin Urine: NEGATIVE
Glucose, UA: 50 mg/dL — AB
Hgb urine dipstick: NEGATIVE
KETONES UR: NEGATIVE mg/dL
Leukocytes, UA: NEGATIVE
NITRITE: NEGATIVE
PH: 6 (ref 5.0–8.0)
Protein, ur: >=300 mg/dL — AB
Specific Gravity, Urine: 1.01 (ref 1.005–1.030)
Squamous Epithelial / LPF: NONE SEEN

## 2017-02-15 LAB — GASTROINTESTINAL PANEL BY PCR, STOOL (REPLACES STOOL CULTURE)

## 2017-02-15 LAB — COMPREHENSIVE METABOLIC PANEL
ALBUMIN: 2.7 g/dL — AB (ref 3.5–5.0)
ALK PHOS: 89 U/L (ref 38–126)
ALT: 12 U/L — AB (ref 17–63)
ANION GAP: 9 (ref 5–15)
AST: 19 U/L (ref 15–41)
BILIRUBIN TOTAL: 0.7 mg/dL (ref 0.3–1.2)
BUN: 62 mg/dL — AB (ref 6–20)
CALCIUM: 8.5 mg/dL — AB (ref 8.9–10.3)
CO2: 17 mmol/L — AB (ref 22–32)
Chloride: 110 mmol/L (ref 101–111)
Creatinine, Ser: 6.21 mg/dL — ABNORMAL HIGH (ref 0.61–1.24)
GFR calc Af Amer: 9 mL/min — ABNORMAL LOW (ref >60)
GFR calc non Af Amer: 8 mL/min — ABNORMAL LOW (ref >60)
GLUCOSE: 148 mg/dL — AB (ref 65–99)
Potassium: 4.1 mmol/L (ref 3.5–5.1)
SODIUM: 136 mmol/L (ref 135–145)
TOTAL PROTEIN: 6.4 g/dL — AB (ref 6.5–8.1)

## 2017-02-15 LAB — PHOSPHORUS: Phosphorus: 4 mg/dL (ref 2.5–4.6)

## 2017-02-15 LAB — C DIFFICILE QUICK SCREEN W PCR REFLEX
C DIFFICILE (CDIFF) INTERP: DETECTED
C DIFFICILE (CDIFF) TOXIN: POSITIVE — AB
C Diff antigen: POSITIVE — AB

## 2017-02-15 LAB — CBC
HCT: 25.9 % — ABNORMAL LOW (ref 40.0–52.0)
Hemoglobin: 8.5 g/dL — ABNORMAL LOW (ref 13.0–18.0)
MCH: 30.9 pg (ref 26.0–34.0)
MCHC: 32.7 g/dL (ref 32.0–36.0)
MCV: 94.4 fL (ref 80.0–100.0)
Platelets: 346 10*3/uL (ref 150–440)
RBC: 2.74 MIL/uL — ABNORMAL LOW (ref 4.40–5.90)
RDW: 14.9 % — AB (ref 11.5–14.5)
WBC: 11.7 10*3/uL — ABNORMAL HIGH (ref 3.8–10.6)

## 2017-02-15 LAB — LIPASE, BLOOD: Lipase: 178 U/L — ABNORMAL HIGH (ref 11–51)

## 2017-02-15 LAB — GLUCOSE, CAPILLARY
GLUCOSE-CAPILLARY: 117 mg/dL — AB (ref 65–99)
Glucose-Capillary: 110 mg/dL — ABNORMAL HIGH (ref 65–99)
Glucose-Capillary: 103 mg/dL — ABNORMAL HIGH (ref 65–99)
Glucose-Capillary: 119 mg/dL — ABNORMAL HIGH (ref 65–99)

## 2017-02-15 MED ORDER — HYDRALAZINE HCL 20 MG/ML IJ SOLN: 10.0000 mg | INTRAMUSCULAR | Status: DC | PRN

## 2017-02-15 MED ORDER — ONDANSETRON HCL 4 MG/2ML IJ SOLN
4.0000 mg | Freq: Once | INTRAMUSCULAR | Status: AC
  Administered 2017-02-15: 4 mg via INTRAVENOUS
  Filled 2017-02-15: qty 2

## 2017-02-15 MED ORDER — SODIUM CHLORIDE 0.9 % IV SOLN
INTRAVENOUS | Status: DC
  Administered 2017-02-15: 10:00:00 via INTRAVENOUS

## 2017-02-15 MED ORDER — INSULIN ASPART 100 UNIT/ML ~~LOC~~ SOLN
0.0000 [IU] | Freq: Three times a day (TID) | SUBCUTANEOUS | Status: DC
  Administered 2017-02-16 – 2017-02-18 (×4): 1 [IU] via SUBCUTANEOUS
  Administered 2017-02-19: 2 [IU] via SUBCUTANEOUS
  Filled 2017-02-15: qty 1
  Filled 2017-02-15: qty 2
  Filled 2017-02-15 (×3): qty 1

## 2017-02-15 MED ORDER — SODIUM CHLORIDE 0.9 % IV BOLUS (SEPSIS)
1000.0000 mL | Freq: Once | INTRAVENOUS | Status: AC
  Administered 2017-02-15: 1000 mL via INTRAVENOUS

## 2017-02-15 MED ORDER — ONDANSETRON HCL 4 MG/2ML IJ SOLN
4.0000 mg | Freq: Once | INTRAMUSCULAR | Status: AC
  Administered 2017-02-15: 4 mg via INTRAVENOUS

## 2017-02-15 MED ORDER — ONDANSETRON HCL 4 MG/2ML IJ SOLN
4.0000 mg | Freq: Four times a day (QID) | INTRAMUSCULAR | Status: DC | PRN
  Administered 2017-02-15 – 2017-02-17 (×4): 4 mg via INTRAVENOUS
  Filled 2017-02-15 (×4): qty 2

## 2017-02-15 MED ORDER — HEPARIN SODIUM (PORCINE) 5000 UNIT/ML IJ SOLN
5000.0000 [IU] | Freq: Three times a day (TID) | INTRAMUSCULAR | Status: DC
  Administered 2017-02-15 – 2017-02-18 (×11): 5000 [IU] via SUBCUTANEOUS
  Filled 2017-02-15 (×11): qty 1

## 2017-02-15 MED ORDER — METRONIDAZOLE 500 MG PO TABS: 500.0000 mg | ORAL_TABLET | Freq: Once | ORAL | Status: DC

## 2017-02-15 MED ORDER — ONDANSETRON HCL 4 MG PO TABS: 4.0000 mg | ORAL_TABLET | Freq: Four times a day (QID) | ORAL | Status: DC | PRN

## 2017-02-15 MED ORDER — SODIUM BICARBONATE 650 MG PO TABS
650.0000 mg | ORAL_TABLET | Freq: Three times a day (TID) | ORAL | Status: DC
  Administered 2017-02-15 – 2017-02-19 (×12): 650 mg via ORAL
  Filled 2017-02-15 (×12): qty 1

## 2017-02-15 MED ORDER — LORAZEPAM 1 MG PO TABS: 1.0000 mg | ORAL_TABLET | Freq: Once | ORAL | Status: DC

## 2017-02-15 MED ORDER — OXYCODONE HCL 5 MG PO TABS
5.0000 mg | ORAL_TABLET | ORAL | Status: DC | PRN
  Administered 2017-02-15 – 2017-02-17 (×7): 5 mg via ORAL
  Filled 2017-02-15 (×8): qty 1

## 2017-02-15 MED ORDER — ONDANSETRON HCL 4 MG/2ML IJ SOLN
INTRAMUSCULAR | Status: AC
  Filled 2017-02-15: qty 2

## 2017-02-15 MED ORDER — MORPHINE SULFATE (PF) 2 MG/ML IV SOLN: 2.0000 mg | INTRAVENOUS | Status: DC | PRN

## 2017-02-15 MED ORDER — METRONIDAZOLE IN NACL 5-0.79 MG/ML-% IV SOLN
500.0000 mg | Freq: Three times a day (TID) | INTRAVENOUS | Status: DC
  Administered 2017-02-15 – 2017-02-16 (×3): 500 mg via INTRAVENOUS
  Filled 2017-02-15 (×5): qty 100

## 2017-02-15 MED ORDER — IOPAMIDOL (ISOVUE-300) INJECTION 61%
15.0000 mL | INTRAVENOUS | Status: AC
  Administered 2017-02-15 (×2): 15 mL via ORAL

## 2017-02-15 MED ORDER — MORPHINE SULFATE (PF) 4 MG/ML IV SOLN
4.0000 mg | Freq: Once | INTRAVENOUS | Status: AC
  Administered 2017-02-15: 4 mg via INTRAVENOUS
  Filled 2017-02-15: qty 1

## 2017-02-15 MED ORDER — IOPAMIDOL (ISOVUE-300) INJECTION 61%: 30.0000 mL | Freq: Once | INTRAVENOUS | Status: DC | PRN

## 2017-02-15 MED ORDER — INSULIN ASPART 100 UNIT/ML ~~LOC~~ SOLN: 0.0000 [IU] | Freq: Every day | SUBCUTANEOUS | Status: DC

## 2017-02-15 MED ORDER — METRONIDAZOLE IN NACL 5-0.79 MG/ML-% IV SOLN
500.0000 mg | Freq: Once | INTRAVENOUS | Status: AC
  Administered 2017-02-15: 500 mg via INTRAVENOUS
  Filled 2017-02-15: qty 100

## 2017-02-15 NOTE — ED Provider Notes (Signed)
[facility] Emergency Department Provider Note  Time seen: 3:51 AM  I have reviewed the triage vital signs and the nursing notes.   HISTORY  Chief Complaint Diarrhea and Abdominal Pain    HPI Martin Gilbert is a 76 y.o. male with a past medical history of CK D, diabetes, hypertension, hyperlipidemia, recent urinary tract infection with urinary retention status post Foley catheter which remains inserted, presents to the emergency department for abdominal discomfort and diarrhea. According to the patient and family for the past 2 days the patient has been complaining of diffuse abdominal pain with very frequent episodes of diarrhea, daughter states every 15 minutes.Denies any nausea or vomiting. Denies fever. Denies black or bloody stool. Currently describes abdominal pain as moderate.  Past Medical History:  Diagnosis Date  . Back pain   . BPH (benign prostatic hyperplasia)   . CKD (chronic kidney disease), stage IV ([place])   . Diabetes mellitus without complication ([place])   . HTN (hypertension)   . Hyperlipidemia     Patient Active Problem List   Diagnosis Date Noted  . UTI (urinary tract infection) 02/02/2017  . E coli infection 02/02/2017  . Lactic acidosis 02/02/2017  . Diabetic neuropathy ([place]) 02/02/2017  . Left-sided chest wall pain 02/02/2017  . Fall 02/02/2017  . Generalized weakness 02/02/2017  . Pink eye, left 02/02/2017  . Leukocytosis 02/02/2017  . Anemia of chronic disease 02/02/2017  . Nausea 02/02/2017  . Sepsis ([place]) 01/31/2017  . Essential hypertension 11/28/2015  . Pain in the chest 02/15/2015  . Neck pain 02/15/2015  . Diabetes type 2, uncontrolled ([place]) 02/15/2015  . Chronic kidney disease, stage IV (severe) ([place]) 02/15/2015  . Frequent headaches 02/15/2015    Past Surgical History:  Procedure Laterality Date  . hernia repair      Prior to Admission medications   Medication Sig Start Date End Date Taking?  Authorizing Provider  acetaminophen (TYLENOL) 500 MG tablet Take 500 mg by mouth every 8 (eight) hours as needed.    Historical Provider, MD  allopurinol (ZYLOPRIM) 100 MG tablet Take 100 mg by mouth daily. 10/17/15   Historical Provider, MD  aspirin 81 MG tablet Take 81 mg by mouth daily.    Historical Provider, MD  atorvastatin (LIPITOR) 20 MG tablet Take 20 mg by mouth daily.    Historical Provider, MD  baclofen (LIORESAL) 10 MG tablet Take 1 tablet (10 mg total) by mouth 3 (three) times daily. Patient not taking: Reported on 01/30/2017 05/27/16   [name], PA-C  carbamide peroxide (DEBROX) 6.5 % otic solution Place 5 drops into the right ear 2 (two) times daily. Patient not taking: Reported on 01/30/2017 05/27/16 05/27/17  [name], PA-C  cephALEXin (KEFLEX) 500 MG capsule Take 1 capsule (500 mg total) by mouth 2 (two) times daily. 02/02/17   Theodoro Grist, MD  ciprofloxacin (CILOXAN) 0.3 % ophthalmic solution Place 1 drop into the left eye every 4 (four) hours while awake. Administer 1 drop, every 2 hours, while awake, for 2 days. Then 1 drop, every 4 hours, while awake, for the next 5 days. 02/02/17   Theodoro Grist, MD  doxazosin (CARDURA) 2 MG tablet Take 2 mg by mouth daily.    Historical Provider, MD  feeding supplement, GLUCERNA SHAKE, (GLUCERNA SHAKE) LIQD Take 237 mLs by mouth 3 (three) times daily between meals. 02/02/17   Theodoro Grist, MD  ferrous sulfate 325 (65 FE) MG tablet Take 325 mg by mouth daily with  breakfast. 08/30/15   Historical Provider, MD  furosemide (LASIX) 40 MG tablet Take 40 mg by mouth daily. 10/17/15   Historical Provider, MD  gabapentin (NEURONTIN) 100 MG capsule Take 1 capsule (100 mg total) by mouth every 8 (eight) hours as needed (tingling or numbness in feet). 02/02/17   Theodoro Grist, MD  glipiZIDE (GLUCOTROL) 5 MG tablet Take 5 mg by mouth daily. 12/27/16   Historical Provider, MD  hydrALAZINE (APRESOLINE) 50 MG tablet Take 50 mg by mouth 3 (three) times  daily.    Historical Provider, MD  Multiple Vitamin (MULTI-VITAMINS) TABS Take 1 tablet by mouth daily.    Historical Provider, MD  ondansetron (ZOFRAN) 4 MG tablet Take 1 tablet (4 mg total) by mouth every 6 (six) hours as needed for nausea. 02/02/17   Theodoro Grist, MD  oxyCODONE (ROXICODONE) 5 MG immediate release tablet Take 1 tablet (5 mg total) by mouth every 8 (eight) hours as needed. Patient not taking: Reported on 01/30/2017 09/01/16 09/01/17  [name], MD  pantoprazole (PROTONIX) 40 MG tablet Take 1 tablet (40 mg total) by mouth daily. 02/03/17   Theodoro Grist, MD  senna-docusate (SENOKOT-S) 8.6-50 MG tablet Take 1 tablet by mouth at bedtime as needed for mild constipation. 02/02/17   Theodoro Grist, MD  sodium bicarbonate 650 MG tablet Take 650 mg by mouth 2 (two) times daily.     Historical Provider, MD  Vitamin D, Cholecalciferol, 1000 UNITS TABS Take by mouth.    Historical Provider, MD    Allergies  Allergen Reactions  . Acetaminophen Hives    Possible lip swelling.  Patient is not sure.  09/01/16- patient denies allergy  . Aspirin Rash    09/01/16- patient denies allergy    Family History  Problem Relation Age of Onset  . Family history unknown: Yes    Social History Social History  Substance Use Topics  . Smoking status: Never Smoker  . Smokeless tobacco: Never Used  . Alcohol use No     Comment: Drank in the past, but has stopped for several years.     Review of Systems Constitutional: Negative for fever. Cardiovascular: Negative for chest pain. Respiratory: Negative for shortness of breath. Gastrointestinal: Moderate abdominal discomfort. Positive for diarrhea, negative for nausea or vomiting. Genitourinary: Negative for dysuria. Neurological: Negative for headache 10-point ROS otherwise negative.  ____________________________________________   PHYSICAL EXAM:  VITAL SIGNS: ED Triage Vitals  Enc Vitals Group     BP 02/15/17 0312 (!) 161/59      Pulse Rate 02/15/17 0312 97     Resp 02/15/17 0312 18     Temp 02/15/17 0312 98.9 F (37.2 C)     Temp Source 02/15/17 0312 Oral     SpO2 02/15/17 0312 99 %     Weight 02/15/17 0312 190 lb (86.2 kg)     Height 02/15/17 0312 5\' 7"  (1.702 m)     Head Circumference --      Peak Flow --      Pain Score 02/15/17 0313 9     Pain Loc --      Pain Edu? --      Excl. in [place]? --     Constitutional: Alert and oriented. Well appearing and in no distress. Eyes: Normal exam ENT   Head: Normocephalic and atraumatic.   Mouth/Throat: Mucous membranes are moist. Cardiovascular: Normal rate, regular rhythm. No murmur Respiratory: Normal respiratory effort without tachypnea nor retractions. Breath sounds are clear  Gastrointestinal: Soft, mild diffuse  abdominal tenderness with moderate left lower quadrant tenderness. No rebound or guarding. No distention. Musculoskeletal: Nontender with normal range of motion in all extremities. Neurologic:   No gross focal neurologic deficits Skin:  Skin is warm, dry and intact.  Psychiatric: Mood and affect are normal.   ____________________________________________     RADIOLOGY  CT pending  ____________________________________________   INITIAL IMPRESSION / ASSESSMENT AND PLAN / ED COURSE  Pertinent labs & imaging results that were available during my care of the patient were reviewed by me and considered in my medical decision making (see chart for details).  Patient with diarrhea and abdominal pain. We'll obtain a CT scan of the abdomen to further evaluate bait we will also obtain stool cultures and send blood work. We'll IV hydrate and treat pain. Overall the patient appears well, no acute distress.  Patient's labs show an elevated creatinine of 6.2 however this appears to be his baseline. Patient's hemoglobin of 8.5 again appears to be at baseline. Patient does have an elevated lipase of 178 which appears new most consistent with acute  pancreatitis. Currently awaiting stool samples will CT scan. The patient's discomfort with elevated lipase I anticipate likely admission to the hospital.  Labs are consistent with acute pancreatitis as well as C. difficile positive. We will start IV Flagyl. CT is pending. Patient will be admitted to the medical service for further treatment.  ____________________________________________   FINAL CLINICAL IMPRESSION(S) / ED DIAGNOSES  Diarrhea Abdominal pain Acute pancreatitis C. difficile   [name], MD 02/15/17 [phone]

## 2017-02-15 NOTE — Consult Note (Signed)
[facility]  CONSULT NOTE    Date: 02/15/2017                  Patient Name:  Martin Gilbert  MRN: [id number]  DOB: 04-11-1941  Age / Sex: 76 y.o., male         PCP: [name], MD                 Service Requesting Consult: Dr. Vianne Bulls                 Reason for Consult: Chronic Kidney Disease stage V            History of Present Illness: Martin Gilbert is a 76 y.o. Hispanic male Spanish speaking with diabetes mellitus type II, hypertension, hepatic cirrhosis, BPH, gout, diabetic neuropathy who was admitted to [facility] on 02/15/2017 for C. difficile colitis [A04.72] Abdominal pain, unspecified abdominal location [R10.9] Acute pancreatitis, unspecified complication status, unspecified pancreatitis type [K85.90]  Patient with abdominal pain and diarrhea. History taken with assistance of interpreter. Patient states he was told he does not currently need dialysis. However he states if he needs dialysis, he will proceed with dialysis.    Medications: Outpatient medications: Prescriptions Prior to Admission  Medication Sig Dispense Refill Last Dose  . acetaminophen (TYLENOL) 500 MG tablet Take 500 mg by mouth every 8 (eight) hours as needed.   PRN at PRN  . allopurinol (ZYLOPRIM) 100 MG tablet Take 100 mg by mouth daily.   UNKNOWN at UNKNOWN  . amLODipine (NORVASC) 2.5 MG tablet Take 2.5 mg by mouth daily.   UNKNOWN at UNKNOWN  . aspirin 81 MG tablet Take 81 mg by mouth daily.   UNKNOWN at UNKNOWN  . atorvastatin (LIPITOR) 20 MG tablet Take 20 mg by mouth daily.   UNKNOWN at UNKNOWN  . doxazosin (CARDURA) 2 MG tablet Take 2 mg by mouth at bedtime.    UNKNOWN at UNKNOWN  . ferrous sulfate 325 (65 FE) MG tablet Take 325 mg by mouth daily with breakfast.  11 UNKNOWN at UNKNOWN  . furosemide (LASIX) 40 MG tablet Take 40 mg by mouth daily.   UNKNOWN at UNKNOWN  . gabapentin (NEURONTIN) 100 MG capsule Take 1 capsule (100 mg total) by mouth  every 8 (eight) hours as needed (tingling or numbness in feet). 90 capsule 0 UNKNOWN at UNKNOWN  . glipiZIDE (GLUCOTROL) 5 MG tablet Take 5 mg by mouth daily.   UNKNOWN at UNKNOWN  . hydrALAZINE (APRESOLINE) 50 MG tablet Take 50 mg by mouth 3 (three) times daily.   UNKNOWN at UNKNOWN  . metoprolol succinate (TOPROL-XL) 25 MG 24 hr tablet Take 25 mg by mouth daily.   UNKNOWN at [facility]  . Multiple Vitamin (MULTI-VITAMINS) TABS Take 1 tablet by mouth daily.   UNKNOWN at UNKNOWN  . pantoprazole (PROTONIX) 40 MG tablet Take 1 tablet (40 mg total) by mouth daily. 30 tablet 6 UNKNOWN at UNKNOWN  . Polyvinyl Alcohol-Povidone (REFRESH OP) Place 1 drop into both eyes 2 (two) times daily.   UNKNOWN at UNKNOWN  . senna-docusate (SENOKOT-S) 8.6-50 MG tablet Take 1 tablet by mouth at bedtime as needed for mild constipation. 30 tablet 6 UNKNOWN at UNKNOWN  . sodium bicarbonate 650 MG tablet Take 650 mg by mouth 2 (two) times daily.    UNKNOWN at UNKNOWN  . Vitamin D, Cholecalciferol, 1000 UNITS TABS Take by mouth.   UNKNOWN at UNKNOWN  . baclofen (LIORESAL) 10  MG tablet Take 1 tablet (10 mg total) by mouth 3 (three) times daily. (Patient not taking: Reported on 01/30/2017) 30 tablet 0 Not Taking at Unknown time  . carbamide peroxide (DEBROX) 6.5 % otic solution Place 5 drops into the right ear 2 (two) times daily. (Patient not taking: Reported on 01/30/2017) 15 mL 2 Not Taking at Unknown time  . feeding supplement, GLUCERNA SHAKE, (GLUCERNA SHAKE) LIQD Take 237 mLs by mouth 3 (three) times daily between meals. 90 Can 6   . ondansetron (ZOFRAN) 4 MG tablet Take 1 tablet (4 mg total) by mouth every 6 (six) hours as needed for nausea. 20 tablet 0   . oxyCODONE (ROXICODONE) 5 MG immediate release tablet Take 1 tablet (5 mg total) by mouth every 8 (eight) hours as needed. (Patient not taking: Reported on 01/30/2017) 20 tablet 0 Not Taking at Unknown time    Current medications: Current Facility-Administered Medications   Medication Dose Route Frequency Provider Last Rate Last Dose  . heparin injection 5,000 Units  5,000 Units Subcutaneous Q8H Saundra Shelling, MD   5,000 Units at 02/15/17 0940  . hydrALAZINE (APRESOLINE) injection 10 mg  10 mg Intravenous Q4H PRN Pavan Pyreddy, MD      . insulin aspart (novoLOG) injection 0-5 Units  0-5 Units Subcutaneous QHS Pavan Pyreddy, MD      . insulin aspart (novoLOG) injection 0-9 Units  0-9 Units Subcutaneous TID WC Pavan Pyreddy, MD      . metroNIDAZOLE (FLAGYL) IVPB 500 mg  500 mg Intravenous Q8H Pavan Pyreddy, MD      . morphine 2 MG/ML injection 2 mg  2 mg Intravenous Q4H PRN Pavan Pyreddy, MD      . ondansetron (ZOFRAN) tablet 4 mg  4 mg Oral Q6H PRN Saundra Shelling, MD       Or  . ondansetron (ZOFRAN) injection 4 mg  4 mg Intravenous Q6H PRN Pavan Pyreddy, MD      . oxyCODONE (Oxy IR/ROXICODONE) immediate release tablet 5 mg  5 mg Oral Q4H PRN Saundra Shelling, MD   5 mg at 02/15/17 4403      Allergies: Allergies  Allergen Reactions  . Acetaminophen Hives    Possible lip swelling.  Patient is not sure.  09/01/16- patient denies allergy  . Aspirin Rash    09/01/16- patient denies allergy      Past Medical History: Past Medical History:  Diagnosis Date  . Back pain   . BPH (benign prostatic hyperplasia)   . CKD (chronic kidney disease), stage IV ([place])   . Diabetes mellitus without complication ([place])   . HTN (hypertension)   . Hyperlipidemia      Past Surgical History: Past Surgical History:  Procedure Laterality Date  . hernia repair       Family History: Family History  Problem Relation Age of Onset  . Diabetes Neg Hx   . Hypertension Neg Hx      Social History: Social History   Social History  . Marital status: Married    Spouse name: N/A  . Number of children: N/A  . Years of education: N/A   Occupational History  . retired    Social History Main Topics  . Smoking status: Never Smoker  . Smokeless tobacco: Never Used  .  Alcohol use No     Comment: Drank in the past, but has stopped for several years.   . Drug use: No  . Sexual activity: Not on file   Other Topics Concern  .  Not on file   Social History Narrative  . No narrative on file     Review of Systems: Review of Systems  Constitutional: Negative.  Negative for chills, diaphoresis, fever, malaise/fatigue and weight loss.  HENT: Negative.  Negative for congestion, ear discharge, ear pain, hearing loss, nosebleeds, sinus pain, sore throat and tinnitus.   Eyes: Negative.  Negative for blurred vision, double vision, photophobia, pain, discharge and redness.  Respiratory: Negative.  Negative for cough, hemoptysis, sputum production, shortness of breath, wheezing and stridor.   Cardiovascular: Positive for leg swelling. Negative for chest pain, palpitations, orthopnea, claudication and PND.  Gastrointestinal: Positive for abdominal pain, diarrhea and nausea. Negative for blood in stool, constipation, heartburn, melena and vomiting.  Genitourinary: Negative.  Negative for dysuria, flank pain, frequency, hematuria and urgency.  Musculoskeletal: Negative.  Negative for back pain, falls, joint pain, myalgias and neck pain.  Skin: Negative.  Negative for itching and rash.  Neurological: Negative for dizziness, tingling, tremors, sensory change, speech change, focal weakness, seizures, loss of consciousness, weakness and headaches.  Endo/Heme/Allergies: Negative for environmental allergies and polydipsia. Does not bruise/bleed easily.  Psychiatric/Behavioral: Negative.  Negative for depression, hallucinations, memory loss, substance abuse and suicidal ideas. The patient is not nervous/anxious and does not have insomnia.     Vital Signs: Blood pressure (!) 142/56, pulse 85, temperature 98.4 F (36.9 C), temperature source Oral, resp. rate 16, height 5\' 7"  (1.702 m), weight 86.2 kg (190 lb), SpO2 97 %.  Weight trends: Filed Weights   02/15/17 2774   Weight: 86.2 kg (190 lb)    Physical Exam: General: NAD, laying in bed  Head: Normocephalic, atraumatic. Moist oral mucosal membranes  Eyes: Anicteric, PERRL  Neck: Supple, trachea midline  Lungs:  Clear to auscultation  Heart: Regular rate and rhythm  Abdomen:  Soft, nontender,   Extremities: Trace-1+ peripheral edema.  Neurologic: Nonfocal, moving all four extremities  Skin: No lesions  Access: none     Lab results: Basic Metabolic Panel:  Recent Labs Lab 02/15/17 0343  NA 136  K 4.1  CL 110  CO2 17*  GLUCOSE 148*  BUN 62*  CREATININE 6.21*  CALCIUM 8.5*    Liver Function Tests:  Recent Labs Lab 02/15/17 0343  AST 19  ALT 12*  ALKPHOS 89  BILITOT 0.7  PROT 6.4*  ALBUMIN 2.7*    Recent Labs Lab 02/15/17 0343  LIPASE 178*   No results for input(s): AMMONIA in the last 168 hours.  CBC:  Recent Labs Lab 02/15/17 0343  WBC 11.7*  HGB 8.5*  HCT 25.9*  MCV 94.4  PLT 346    Cardiac Enzymes: No results for input(s): CKTOTAL, CKMB, CKMBINDEX, TROPONINI in the last 168 hours.  BNP: Invalid input(s): POCBNP  CBG:  Recent Labs Lab 02/15/17 0918 02/15/17 1140  GLUCAP 110* 103*    Microbiology: Results for orders placed or performed during the hospital encounter of 02/15/17  Gastrointestinal Panel by PCR , Stool     Status: None   Collection Time: 02/15/17  4:15 AM  Result Value Ref Range Status   Campylobacter species NOT DETECTED NOT DETECTED Final   Plesimonas shigelloides NOT DETECTED NOT DETECTED Final   Salmonella species NOT DETECTED NOT DETECTED Final   Yersinia enterocolitica NOT DETECTED NOT DETECTED Final   Vibrio species NOT DETECTED NOT DETECTED Final   Vibrio cholerae NOT DETECTED NOT DETECTED Final   Enteroaggregative E coli (EAEC) NOT DETECTED NOT DETECTED Final   Enteropathogenic E coli (EPEC)  NOT DETECTED NOT DETECTED Final   Enterotoxigenic E coli (ETEC) NOT DETECTED NOT DETECTED Final   Shiga like toxin producing  E coli (STEC) NOT DETECTED NOT DETECTED Final   Shigella/Enteroinvasive E coli (EIEC) NOT DETECTED NOT DETECTED Final   Cryptosporidium NOT DETECTED NOT DETECTED Final   Cyclospora cayetanensis NOT DETECTED NOT DETECTED Final   Entamoeba histolytica NOT DETECTED NOT DETECTED Final   Giardia lamblia NOT DETECTED NOT DETECTED Final   Adenovirus F40/41 NOT DETECTED NOT DETECTED Final   Astrovirus NOT DETECTED NOT DETECTED Final   Norovirus GI/GII NOT DETECTED NOT DETECTED Final   Rotavirus A NOT DETECTED NOT DETECTED Final   Sapovirus (I, II, IV, and V) NOT DETECTED NOT DETECTED Final  C difficile quick scan w PCR reflex     Status: Abnormal   Collection Time: 02/15/17  4:15 AM  Result Value Ref Range Status   C Diff antigen POSITIVE (A) NEGATIVE Final   C Diff toxin POSITIVE (A) NEGATIVE Final   C Diff interpretation Toxin producing C. difficile detected.  Final    Comment: CRITICAL RESULT CALLED TO, READ BACK BY AND VERIFIED WITH: Martin Gilbert AT 9509 02/15/17.PMH     Coagulation Studies: No results for input(s): LABPROT, INR in the last 72 hours.  Urinalysis:  Recent Labs  02/15/17 0343  COLORURINE YELLOW*  LABSPEC 1.010  PHURINE 6.0  GLUCOSEU 50*  HGBUR NEGATIVE  BILIRUBINUR NEGATIVE  KETONESUR NEGATIVE  PROTEINUR >=300*  NITRITE NEGATIVE  LEUKOCYTESUR NEGATIVE      Imaging: Ct Abdomen Pelvis Wo Contrast  Result Date: 02/15/2017 CLINICAL DATA:  Acute onset of generalized abdominal pain and diarrhea. Recent placement of catheter for urinary retention. Initial encounter. EXAM: CT ABDOMEN AND PELVIS WITHOUT CONTRAST TECHNIQUE: Multidetector CT imaging of the abdomen and pelvis was performed following the standard protocol without IV contrast. COMPARISON:  CT of the abdomen and pelvis performed 02/12/2016, MRI of the lumbar spine performed 02/01/2017, and renal ultrasound performed 01/31/2017 FINDINGS: Lower chest: Mild coronary artery calcifications are seen. Minimal bibasilar  atelectasis is noted. A small hiatal hernia is noted. Hepatobiliary: The nodular contour of the liver is compatible with hepatic cirrhosis. Minimal soft tissue inflammation is noted about the gallbladder. The gallbladder is otherwise grossly unremarkable. The common bile duct remains normal in caliber. Pancreas: The pancreas is within normal limits. Spleen: The spleen is unremarkable in appearance. Adrenals/Urinary Tract: The adrenal glands are grossly unremarkable in appearance. Mild bilateral renal atrophy is noted, with nonspecific perinephric stranding noted bilaterally. There is no evidence of hydronephrosis. No renal or ureteral stones are identified. Stomach/Bowel: The stomach is unremarkable in appearance. The small bowel is within normal limits. The appendix is normal in caliber, without evidence of appendicitis. Mild soft tissue inflammation is suggested about the cecum, and trace soft tissue inflammation is seen along the paracolic gutters bilaterally. There is question of additional soft tissue inflammation about the sigmoid colon. This raises concern for a mild infectious or inflammatory process. Vascular/Lymphatic: The abdominal aorta is unremarkable in appearance. The inferior vena cava is grossly unremarkable. No retroperitoneal lymphadenopathy is seen. Scattered mildly prominent bilateral pelvic sidewall nodes are seen, measuring up to 1.0 cm in size. Reproductive: Mild soft tissue inflammation about the bladder is nonspecific. The prostate is enlarged, measuring up to 5.8 cm in transverse dimension, with impression on the base of the bladder. The patient's Foley catheter is noted ending at the bladder. Other: No additional soft tissue abnormalities are seen. Musculoskeletal: No acute osseous abnormalities  are identified. The visualized musculature is unremarkable in appearance. IMPRESSION: 1. Mild soft tissue inflammation suggested about the cecum, and trace soft tissue inflammation along the  paracolic gutters bilaterally. Question of additional soft tissue inflammation about the sigmoid colon. This raises concern for a mild infectious or inflammatory process. 2. Minimal soft tissue inflammation about the gallbladder. The gallbladder is otherwise grossly unremarkable. Would correlate for any evidence of very mild cholecystitis. 3. Enlarged prostate, with impression on the base of bladder. Would correlate with PSA. 4. Mildly prominent bilateral pelvic sidewall nodes, measuring up to 1.0 cm, slightly more prominent than in 2017. Metastatic disease cannot be excluded. 5. Findings of hepatic cirrhosis. 6. Mild coronary artery calcifications seen. 7. Small hiatal hernia noted. 8. Mild bowel renal atrophy. Electronically Signed   By: Garald Balding M.D.   On: 02/15/2017 06:52      Assessment & Plan: Martin Gilbert is a 76 y.o. Hispanic male Spanish speaking with diabetes mellitus type II, hypertension, hepatic cirrhosis, BPH, gout, diabetic neuropathy who was admitted to [facility] on 02/15/2017 for C. difficile colitis [A04.72] Abdominal pain, unspecified abdominal location [R10.9] Acute pancreatitis, unspecified complication status, unspecified pancreatitis type [K85.90]  1. Acute renal failure on chronic kidney disease stage V: with proteinuria, metabolic acidosis and hypoalbuminemia.  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 Baseline creatinine of 6.23, GFR of 8 on 02/11/17. Followed by Dr. Juanito Doom, [facility].  No acute indication for dialysis at this time.  - Discontinue IV fluids - Renally dose all medications - Discussed dialysis and diet with patient - Continue sodium bicarbonate  2. Hypertension: blood pressure mildly elevated. With peripheral edema on examination.  Home regimen of amlodipine, furosemide, hydralazine, and metoprolol. Not on ACE-I/ARB due to advanced renal disease.   3. Diabetes mellitus type II with chronic kidney disease:  insulin dependent - check hemoglobin A1c.   4. Anemia of chronic kidney disease: gets outpatient Aranesp. Hemoglobin 8.5  5. Secondary Hyperparathyroidism: PTH from 2016 at 91. No recent PTH.  - Check PTH and phosphorus.   LOS: 0 Martin Gilbert 3/3/201811:59 AM

## 2017-02-15 NOTE — ED Notes (Signed)
Patient had large emesis in CT. MD Paduchowski ordered 4 mg Zofran IV. RN to CT to administer medication - medication given.

## 2017-02-15 NOTE — Progress Notes (Signed)
Admitted this morning because of diarrhea, C. difficile colitis. Continue Flagyl. According to [facility] nephrology records patient does not want to continue dialysis.  Has abdominal pain, diarrhea.  Vitals, labs reviewed  Physical examination ;alert awake oriented.  Cardiovascula;r S1-S2 regular  Lungs ;clear to auscultation  Abdomen soft but tender tender in epigastric and right and left lower quadrants area. Management and plan;  #1 acute C. difficile colitis: The nausea and vomiting: Continue Flagyl, gentle hydration 2.CKD  stage IV I: Patient does not want dialysis when he reaches ESRD. As per medical records from [facility] nephrology. Time;25 min D/w nephrology here.

## 2017-02-15 NOTE — H&P (Signed)
[facility] [place] NAME: Martin Gilbert    MR#:  [id number]  DATE OF BIRTH:  1941/08/08  DATE OF ADMISSION:  02/15/2017  PRIMARY CARE PHYSICIAN: [name], MD   REQUESTING/REFERRING PHYSICIAN:   CHIEF COMPLAINT:   Chief Complaint  Patient presents with  . Diarrhea  . Abdominal Pain    HISTORY OF PRESENT ILLNESS: Martin Gilbert  is a 76 y.o. male with a known history of Chronic kidney disease stage IV, benign prostate hypertrophy, diabetes mellitus type 2, hypertension, hyperlipidemia presented to the emergency room with diarrhea for the last 2 days. The diarrhea is watery in nature. Patient also has abdominal discomfort is aching in nature 3 out of 10 on a scale of 1-10. Has nausea and patient had an episode of vomiting after he was done with CT abdomen today in the emergency room. Patient was evaluated and his stool was positive for C. difficile toxin. He was started on metronidazole antibiotic. Patient was recently treated for urinary tract infection with oral antibiotics prior to arrival to the emergency room today. He has indwelling Foley catheter from home. No complaints of any chest pain, shortness of breath. No headache dizziness and blurry vision. No fever or chills and cough. Hospitalist service was consulted for further care of the patient.  PAST MEDICAL HISTORY:   Past Medical History:  Diagnosis Date  . Back pain   . BPH (benign prostatic hyperplasia)   . CKD (chronic kidney disease), stage IV ([place])   . Diabetes mellitus without complication ([place])   . HTN (hypertension)   . Hyperlipidemia     PAST SURGICAL HISTORY: Past Surgical History:  Procedure Laterality Date  . hernia repair      SOCIAL HISTORY:  Social History  Substance Use Topics  . Smoking status: Never Smoker  . Smokeless tobacco: Never Used  . Alcohol use No     Comment: Drank in the past, but has stopped for several years.      FAMILY HISTORY:  Family History  Problem Relation Age of Onset  . Family history unknown: Yes    DRUG ALLERGIES:  Allergies  Allergen Reactions  . Acetaminophen Hives    Possible lip swelling.  Patient is not sure.  09/01/16- patient denies allergy  . Aspirin Rash    09/01/16- patient denies allergy    REVIEW OF SYSTEMS:   CONSTITUTIONAL: No fever, has weakness.  EYES: No blurred or double vision.  EARS, NOSE, AND THROAT: No tinnitus or ear pain.  RESPIRATORY: No cough, shortness of breath, wheezing or hemoptysis.  CARDIOVASCULAR: No chest pain, orthopnea, edema.  GASTROINTESTINAL: Has nausea, vomiting, diarrhea and abdominal pain.  GENITOURINARY: No dysuria, hematuria.  ENDOCRINE: No polyuria, nocturia,  HEMATOLOGY: No anemia, easy bruising or bleeding SKIN: No rash or lesion. MUSCULOSKELETAL: No joint pain or arthritis.   NEUROLOGIC: No tingling, numbness, weakness.  PSYCHIATRY: No anxiety or depression.   MEDICATIONS AT HOME:  Prior to Admission medications   Medication Sig Start Date End Date Taking? Authorizing Provider  acetaminophen (TYLENOL) 500 MG tablet Take 500 mg by mouth every 8 (eight) hours as needed.    Historical Provider, MD  allopurinol (ZYLOPRIM) 100 MG tablet Take 100 mg by mouth daily. 10/17/15   Historical Provider, MD  aspirin 81 MG tablet Take 81 mg by mouth daily.    Historical Provider, MD  atorvastatin (LIPITOR) 20 MG tablet Take 20 mg by mouth daily.  Historical Provider, MD  baclofen (LIORESAL) 10 MG tablet Take 1 tablet (10 mg total) by mouth 3 (three) times daily. Patient not taking: Reported on 01/30/2017 05/27/16   [name], PA-C  carbamide peroxide (DEBROX) 6.5 % otic solution Place 5 drops into the right ear 2 (two) times daily. Patient not taking: Reported on 01/30/2017 05/27/16 05/27/17  [name], PA-C  cephALEXin (KEFLEX) 500 MG capsule Take 1 capsule (500 mg total) by mouth 2 (two) times daily. 02/02/17   Theodoro Grist, MD   ciprofloxacin (CILOXAN) 0.3 % ophthalmic solution Place 1 drop into the left eye every 4 (four) hours while awake. Administer 1 drop, every 2 hours, while awake, for 2 days. Then 1 drop, every 4 hours, while awake, for the next 5 days. 02/02/17   Theodoro Grist, MD  doxazosin (CARDURA) 2 MG tablet Take 2 mg by mouth daily.    Historical Provider, MD  feeding supplement, GLUCERNA SHAKE, (GLUCERNA SHAKE) LIQD Take 237 mLs by mouth 3 (three) times daily between meals. 02/02/17   Theodoro Grist, MD  ferrous sulfate 325 (65 FE) MG tablet Take 325 mg by mouth daily with breakfast. 08/30/15   Historical Provider, MD  furosemide (LASIX) 40 MG tablet Take 40 mg by mouth daily. 10/17/15   Historical Provider, MD  gabapentin (NEURONTIN) 100 MG capsule Take 1 capsule (100 mg total) by mouth every 8 (eight) hours as needed (tingling or numbness in feet). 02/02/17   Theodoro Grist, MD  glipiZIDE (GLUCOTROL) 5 MG tablet Take 5 mg by mouth daily. 12/27/16   Historical Provider, MD  hydrALAZINE (APRESOLINE) 50 MG tablet Take 50 mg by mouth 3 (three) times daily.    Historical Provider, MD  Multiple Vitamin (MULTI-VITAMINS) TABS Take 1 tablet by mouth daily.    Historical Provider, MD  ondansetron (ZOFRAN) 4 MG tablet Take 1 tablet (4 mg total) by mouth every 6 (six) hours as needed for nausea. 02/02/17   Theodoro Grist, MD  oxyCODONE (ROXICODONE) 5 MG immediate release tablet Take 1 tablet (5 mg total) by mouth every 8 (eight) hours as needed. Patient not taking: Reported on 01/30/2017 09/01/16 09/01/17  [name], MD  pantoprazole (PROTONIX) 40 MG tablet Take 1 tablet (40 mg total) by mouth daily. 02/03/17   Theodoro Grist, MD  senna-docusate (SENOKOT-S) 8.6-50 MG tablet Take 1 tablet by mouth at bedtime as needed for mild constipation. 02/02/17   Theodoro Grist, MD  sodium bicarbonate 650 MG tablet Take 650 mg by mouth 2 (two) times daily.     Historical Provider, MD  Vitamin D, Cholecalciferol, 1000 UNITS TABS Take by  mouth.    Historical Provider, MD      PHYSICAL EXAMINATION:   VITAL SIGNS: Blood pressure (!) 152/67, pulse 88, temperature 98.8 F (37.1 C), temperature source Oral, resp. rate 16, height 5\' 7"  (1.702 m), weight 86.2 kg (190 lb), SpO2 96 %.  GENERAL:  76 y.o.-year-old patient lying in the bed with no acute distress.  EYES: Pupils equal, round, reactive to light and accommodation. No scleral icterus. Extraocular muscles intact.  HEENT: Head atraumatic, normocephalic. Oropharynx dry and nasopharynx clear.  NECK:  Supple, no jugular venous distention. No thyroid enlargement, no tenderness.  LUNGS: Normal breath sounds bilaterally, no wheezing, rales,rhonchi or crepitation. No use of accessory muscles of respiration.  CARDIOVASCULAR: S1, S2 normal. No murmurs, rubs, or gallops.  ABDOMEN: Soft, tenderness around umbilicus, nondistended. Bowel sounds present. No organomegaly or mass.  EXTREMITIES: No pedal edema, cyanosis, or  clubbing.  NEUROLOGIC: Cranial nerves II through XII are intact. Muscle strength 5/5 in all extremities. Sensation intact. Gait not checked.  PSYCHIATRIC: The patient is alert and oriented x 3.  SKIN: No obvious rash, lesion, or ulcer.   LABORATORY PANEL:   CBC  Recent Labs Lab 02/15/17 0343  WBC 11.7*  HGB 8.5*  HCT 25.9*  PLT 346  MCV 94.4  MCH 30.9  MCHC 32.7  RDW 14.9*   ------------------------------------------------------------------------------------------------------------------  Chemistries   Recent Labs Lab 02/15/17 0343  NA 136  K 4.1  CL 110  CO2 17*  GLUCOSE 148*  BUN 62*  CREATININE 6.21*  CALCIUM 8.5*  AST 19  ALT 12*  ALKPHOS 89  BILITOT 0.7   ------------------------------------------------------------------------------------------------------------------ estimated creatinine clearance is 10.6 mL/min (by C-G formula based on SCr of 6.21 mg/dL  (H)). ------------------------------------------------------------------------------------------------------------------ No results for input(s): TSH, T4TOTAL, T3FREE, THYROIDAB in the last 72 hours.  Invalid input(s): FREET3   Coagulation profile No results for input(s): INR, PROTIME in the last 168 hours. ------------------------------------------------------------------------------------------------------------------- No results for input(s): DDIMER in the last 72 hours. -------------------------------------------------------------------------------------------------------------------  Cardiac Enzymes No results for input(s): CKMB, TROPONINI, MYOGLOBIN in the last 168 hours.  Invalid input(s): CK ------------------------------------------------------------------------------------------------------------------ Invalid input(s): POCBNP  ---------------------------------------------------------------------------------------------------------------  Urinalysis    Component Value Date/Time   COLORURINE YELLOW (A) 02/15/2017 0343   APPEARANCEUR CLEAR (A) 02/15/2017 0343   APPEARANCEUR Turbid 08/29/2013 2049   LABSPEC 1.010 02/15/2017 0343   LABSPEC 1.015 08/29/2013 2049   PHURINE 6.0 02/15/2017 0343   GLUCOSEU 50 (A) 02/15/2017 0343   GLUCOSEU 50 mg/dL 08/29/2013 2049   HGBUR NEGATIVE 02/15/2017 0343   BILIRUBINUR NEGATIVE 02/15/2017 0343   BILIRUBINUR Negative 08/29/2013 2049   KETONESUR NEGATIVE 02/15/2017 0343   PROTEINUR >=300 (A) 02/15/2017 0343   NITRITE NEGATIVE 02/15/2017 0343   LEUKOCYTESUR NEGATIVE 02/15/2017 0343   LEUKOCYTESUR 3+ 08/29/2013 2049     RADIOLOGY: Ct Abdomen Pelvis Wo Contrast  Result Date: 02/15/2017 CLINICAL DATA:  Acute onset of generalized abdominal pain and diarrhea. Recent placement of catheter for urinary retention. Initial encounter. EXAM: CT ABDOMEN AND PELVIS WITHOUT CONTRAST TECHNIQUE: Multidetector CT imaging of the abdomen and pelvis  was performed following the standard protocol without IV contrast. COMPARISON:  CT of the abdomen and pelvis performed 02/12/2016, MRI of the lumbar spine performed 02/01/2017, and renal ultrasound performed 01/31/2017 FINDINGS: Lower chest: Mild coronary artery calcifications are seen. Minimal bibasilar atelectasis is noted. A small hiatal hernia is noted. Hepatobiliary: The nodular contour of the liver is compatible with hepatic cirrhosis. Minimal soft tissue inflammation is noted about the gallbladder. The gallbladder is otherwise grossly unremarkable. The common bile duct remains normal in caliber. Pancreas: The pancreas is within normal limits. Spleen: The spleen is unremarkable in appearance. Adrenals/Urinary Tract: The adrenal glands are grossly unremarkable in appearance. Mild bilateral renal atrophy is noted, with nonspecific perinephric stranding noted bilaterally. There is no evidence of hydronephrosis. No renal or ureteral stones are identified. Stomach/Bowel: The stomach is unremarkable in appearance. The small bowel is within normal limits. The appendix is normal in caliber, without evidence of appendicitis. Mild soft tissue inflammation is suggested about the cecum, and trace soft tissue inflammation is seen along the paracolic gutters bilaterally. There is question of additional soft tissue inflammation about the sigmoid colon. This raises concern for a mild infectious or inflammatory process. Vascular/Lymphatic: The abdominal aorta is unremarkable in appearance. The inferior vena cava is grossly unremarkable. No retroperitoneal lymphadenopathy is seen. Scattered mildly prominent bilateral  pelvic sidewall nodes are seen, measuring up to 1.0 cm in size. Reproductive: Mild soft tissue inflammation about the bladder is nonspecific. The prostate is enlarged, measuring up to 5.8 cm in transverse dimension, with impression on the base of the bladder. The patient's Foley catheter is noted ending at the  bladder. Other: No additional soft tissue abnormalities are seen. Musculoskeletal: No acute osseous abnormalities are identified. The visualized musculature is unremarkable in appearance. IMPRESSION: 1. Mild soft tissue inflammation suggested about the cecum, and trace soft tissue inflammation along the paracolic gutters bilaterally. Question of additional soft tissue inflammation about the sigmoid colon. This raises concern for a mild infectious or inflammatory process. 2. Minimal soft tissue inflammation about the gallbladder. The gallbladder is otherwise grossly unremarkable. Would correlate for any evidence of very mild cholecystitis. 3. Enlarged prostate, with impression on the base of bladder. Would correlate with PSA. 4. Mildly prominent bilateral pelvic sidewall nodes, measuring up to 1.0 cm, slightly more prominent than in 2017. Metastatic disease cannot be excluded. 5. Findings of hepatic cirrhosis. 6. Mild coronary artery calcifications seen. 7. Small hiatal hernia noted. 8. Mild bowel renal atrophy. Electronically Signed   By: Garald Balding M.D.   On: 02/15/2017 06:52    EKG: Orders placed or performed during the hospital encounter of 01/30/17  . EKG 12-Lead  . EKG 12-Lead    IMPRESSION AND PLAN: 76 year old male patient with history of chronic kidney disease stage IV, hypertension, type 2 diabetes mellitus, benign prostate hypertrophy presented to the emergency room with diarrhea, nausea and abdominal discomfort. Admitting diagnosis 1. Acute Clostridium difficile colitis 2. Acute pancreatitis 3. Nausea and vomiting 4. Chronic kidney disease stage IV 5. Hypertension Treatment plan  Admit patient to medical floor IV fluid hydration   nephrology consultation Start patient on metronidazole antibiotic IV Zofran for nausea and vomiting Enteric precautions Keep patient nothing by mouth Follow-up lipase level Supportive care   All the records are reviewed and case discussed with ED  provider. Management plans discussed with the patient, family and they are in agreement.  CODE STATUS:FULL CODE Code Status History    Date Active Date Inactive Code Status Order ID Comments User Context   01/31/2017  2:36 AM 02/02/2017  8:22 PM Full Code [id number]  Saundra Shelling, MD ED       TOTAL TIME TAKING CARE OF THIS PATIENT: 51 minutes.    Saundra Shelling M.D on 02/15/2017 at 7:34 AM  Between 7am to 6pm - Pager - [phone]  After 6pm go to [email or web address] - password [email or web address] [facility]  Office  [phone]  CC: Primary care physician; [name], MD

## 2017-02-15 NOTE — ED Triage Notes (Addendum)
Pt to triage via [place], report diarrhea and generalized abd pain since yesterday.  States diarrhea ever few minutes, but very little in volume.  Pt reports urinary catheter in place x 2 weeks for urinary retention

## 2017-02-16 LAB — BASIC METABOLIC PANEL
Anion gap: 6 (ref 5–15)
BUN: 58 mg/dL — ABNORMAL HIGH (ref 6–20)
CHLORIDE: 115 mmol/L — AB (ref 101–111)
CO2: 19 mmol/L — AB (ref 22–32)
Calcium: 8.3 mg/dL — ABNORMAL LOW (ref 8.9–10.3)
Creatinine, Ser: 6.37 mg/dL — ABNORMAL HIGH (ref 0.61–1.24)
GFR calc non Af Amer: 8 mL/min — ABNORMAL LOW (ref >60)
GFR, EST AFRICAN AMERICAN: 9 mL/min — AB (ref >60)
Glucose, Bld: 111 mg/dL — ABNORMAL HIGH (ref 65–99)
POTASSIUM: 4.4 mmol/L (ref 3.5–5.1)
SODIUM: 140 mmol/L (ref 135–145)

## 2017-02-16 LAB — CBC
HEMATOCRIT: 25.4 % — AB (ref 40.0–52.0)
HEMOGLOBIN: 8.2 g/dL — AB (ref 13.0–18.0)
MCH: 30.9 pg (ref 26.0–34.0)
MCHC: 32.4 g/dL (ref 32.0–36.0)
MCV: 95.4 fL (ref 80.0–100.0)
Platelets: 355 10*3/uL (ref 150–440)
RBC: 2.67 MIL/uL — AB (ref 4.40–5.90)
RDW: 15 % — ABNORMAL HIGH (ref 11.5–14.5)
WBC: 17.1 10*3/uL — AB (ref 3.8–10.6)

## 2017-02-16 LAB — HEMOGLOBIN A1C
Hgb A1c MFr Bld: 5.2 % (ref 4.8–5.6)
Mean Plasma Glucose: 103 mg/dL

## 2017-02-16 LAB — GLUCOSE, CAPILLARY
Glucose-Capillary: 104 mg/dL — ABNORMAL HIGH (ref 65–99)
Glucose-Capillary: 119 mg/dL — ABNORMAL HIGH (ref 65–99)
Glucose-Capillary: 121 mg/dL — ABNORMAL HIGH (ref 65–99)
Glucose-Capillary: 160 mg/dL — ABNORMAL HIGH (ref 65–99)

## 2017-02-16 LAB — LIPASE, BLOOD: Lipase: 62 U/L — ABNORMAL HIGH (ref 11–51)

## 2017-02-16 MED ORDER — HYDRALAZINE HCL 50 MG PO TABS
50.0000 mg | ORAL_TABLET | Freq: Three times a day (TID) | ORAL | Status: DC
  Administered 2017-02-16 – 2017-02-19 (×7): 50 mg via ORAL
  Filled 2017-02-16 (×8): qty 1

## 2017-02-16 MED ORDER — ACETAMINOPHEN 325 MG PO TABS
650.0000 mg | ORAL_TABLET | Freq: Once | ORAL | Status: AC
  Administered 2017-02-16: 650 mg via ORAL
  Filled 2017-02-16: qty 2

## 2017-02-16 MED ORDER — IBUPROFEN 400 MG PO TABS
400.0000 mg | ORAL_TABLET | Freq: Once | ORAL | Status: DC
  Filled 2017-02-16: qty 1

## 2017-02-16 MED ORDER — GLUCERNA SHAKE PO LIQD
237.0000 mL | Freq: Three times a day (TID) | ORAL | Status: DC
  Administered 2017-02-16 – 2017-02-19 (×8): 237 mL via ORAL

## 2017-02-16 MED ORDER — METOPROLOL SUCCINATE ER 25 MG PO TB24
25.0000 mg | ORAL_TABLET | Freq: Every day | ORAL | Status: DC
  Administered 2017-02-16 – 2017-02-19 (×3): 25 mg via ORAL
  Filled 2017-02-16 (×4): qty 1

## 2017-02-16 MED ORDER — VANCOMYCIN 50 MG/ML ORAL SOLUTION
125.0000 mg | Freq: Four times a day (QID) | ORAL | Status: DC
  Administered 2017-02-16 – 2017-02-19 (×12): 125 mg via ORAL
  Filled 2017-02-16 (×13): qty 2.5

## 2017-02-16 MED ORDER — DOXAZOSIN MESYLATE 2 MG PO TABS
2.0000 mg | ORAL_TABLET | Freq: Every day | ORAL | Status: DC
  Administered 2017-02-16 – 2017-02-18 (×3): 2 mg via ORAL
  Filled 2017-02-16 (×4): qty 1

## 2017-02-16 MED ORDER — FERROUS SULFATE 325 (65 FE) MG PO TABS
325.0000 mg | ORAL_TABLET | Freq: Every day | ORAL | Status: DC
  Administered 2017-02-17 – 2017-02-19 (×3): 325 mg via ORAL
  Filled 2017-02-16 (×3): qty 1

## 2017-02-16 MED ORDER — AMLODIPINE BESYLATE 5 MG PO TABS
2.5000 mg | ORAL_TABLET | Freq: Every day | ORAL | Status: DC
  Administered 2017-02-16 – 2017-02-19 (×3): 2.5 mg via ORAL
  Filled 2017-02-16 (×4): qty 1

## 2017-02-16 MED ORDER — GABAPENTIN 100 MG PO CAPS: 100.0000 mg | ORAL_CAPSULE | Freq: Three times a day (TID) | ORAL | Status: DC | PRN

## 2017-02-16 NOTE — Progress Notes (Signed)
Language line used, pt states he is not allergic to tylenol or aspirin

## 2017-02-16 NOTE — Progress Notes (Signed)
[facility]  ROUNDING NOTE   Subjective:   Wife at bedside. History taken with assistance of Spanish interpreter.   Complains about his foley catheter which was exchanged last night.   + diarrhea and abdominal pain.  Objective:  Vital signs in last 24 hours:  Temp:  [98.4 F (36.9 C)-98.8 F (37.1 C)] 98.8 F (37.1 C) (03/04 0543) Pulse Rate:  [80-83] 81 (03/04 0543) Resp:  [16-19] 16 (03/04 0543) BP: (138-159)/(44-66) 142/44 (03/04 0543) SpO2:  [97 %] 97 % (03/04 0543)  Weight change:  Filed Weights   02/15/17 0312  Weight: 86.2 kg (190 lb)    Intake/Output: I/O last 3 completed shifts: In: 1100 [IV Piggyback:1100] Out: 600 [Urine:600]   Intake/Output this shift:  No intake/output data recorded.  Physical Exam: General: NAD, laying in bed  Head: Normocephalic, atraumatic. Moist oral mucosal membranes  Eyes: Anicteric, PERRL  Neck: Supple, trachea midline  Lungs:  Clear to auscultation  Heart: Regular rate and rhythm  Abdomen:  Soft, tender to palpation  Extremities:  1+ peripheral edema.  Neurologic: Nonfocal, moving all four extremities  Skin: No lesions  Access: none    Basic Metabolic Panel:  Recent Labs Lab 02/15/17 0343 02/15/17 1426 02/16/17 0342  NA 136  --  140  K 4.1  --  4.4  CL 110  --  115*  CO2 17*  --  19*  GLUCOSE 148*  --  111*  BUN 62*  --  58*  CREATININE 6.21*  --  6.37*  CALCIUM 8.5*  --  8.3*  PHOS  --  4.0  --     Liver Function Tests:  Recent Labs Lab 02/15/17 0343  AST 19  ALT 12*  ALKPHOS 89  BILITOT 0.7  PROT 6.4*  ALBUMIN 2.7*    Recent Labs Lab 02/15/17 0343 02/16/17 0342  LIPASE 178* 62*   No results for input(s): AMMONIA in the last 168 hours.  CBC:  Recent Labs Lab 02/15/17 0343 02/16/17 0342  WBC 11.7* 17.1*  HGB 8.5* 8.2*  HCT 25.9* 25.4*  MCV 94.4 95.4  PLT 346 355    Cardiac Enzymes: No results for input(s): CKTOTAL, CKMB, CKMBINDEX, TROPONINI in the last 168  hours.  BNP: Invalid input(s): POCBNP  CBG:  Recent Labs Lab 02/15/17 0918 02/15/17 1140 02/15/17 1640 02/15/17 2114 02/16/17 0741  GLUCAP 110* 103* 117* 119* 104*    Microbiology: Results for orders placed or performed during the hospital encounter of 02/15/17  Gastrointestinal Panel by PCR , Stool     Status: None   Collection Time: 02/15/17  4:15 AM  Result Value Ref Range Status   Campylobacter species NOT DETECTED NOT DETECTED Final   Plesimonas shigelloides NOT DETECTED NOT DETECTED Final   Salmonella species NOT DETECTED NOT DETECTED Final   Yersinia enterocolitica NOT DETECTED NOT DETECTED Final   Vibrio species NOT DETECTED NOT DETECTED Final   Vibrio cholerae NOT DETECTED NOT DETECTED Final   Enteroaggregative E coli (EAEC) NOT DETECTED NOT DETECTED Final   Enteropathogenic E coli (EPEC) NOT DETECTED NOT DETECTED Final   Enterotoxigenic E coli (ETEC) NOT DETECTED NOT DETECTED Final   Shiga like toxin producing E coli (STEC) NOT DETECTED NOT DETECTED Final   Shigella/Enteroinvasive E coli (EIEC) NOT DETECTED NOT DETECTED Final   Cryptosporidium NOT DETECTED NOT DETECTED Final   Cyclospora cayetanensis NOT DETECTED NOT DETECTED Final   Entamoeba histolytica NOT DETECTED NOT DETECTED Final   Giardia lamblia NOT DETECTED NOT DETECTED  Final   Adenovirus F40/41 NOT DETECTED NOT DETECTED Final   Astrovirus NOT DETECTED NOT DETECTED Final   Norovirus GI/GII NOT DETECTED NOT DETECTED Final   Rotavirus A NOT DETECTED NOT DETECTED Final   Sapovirus (I, II, IV, and V) NOT DETECTED NOT DETECTED Final  C difficile quick scan w PCR reflex     Status: Abnormal   Collection Time: 02/15/17  4:15 AM  Result Value Ref Range Status   C Diff antigen POSITIVE (A) NEGATIVE Final   C Diff toxin POSITIVE (A) NEGATIVE Final   C Diff interpretation Toxin producing C. difficile detected.  Final    Comment: CRITICAL RESULT CALLED TO, READ BACK BY AND VERIFIED WITH: [facility] YUAL AT 3016  02/15/17.PMH     Coagulation Studies: No results for input(s): LABPROT, INR in the last 72 hours.  Urinalysis:  Recent Labs  02/15/17 0343  COLORURINE YELLOW*  LABSPEC 1.010  PHURINE 6.0  GLUCOSEU 50*  HGBUR NEGATIVE  BILIRUBINUR NEGATIVE  KETONESUR NEGATIVE  PROTEINUR >=300*  NITRITE NEGATIVE  LEUKOCYTESUR NEGATIVE      Imaging: Ct Abdomen Pelvis Wo Contrast  Result Date: 02/15/2017 CLINICAL DATA:  Acute onset of generalized abdominal pain and diarrhea. Recent placement of catheter for urinary retention. Initial encounter. EXAM: CT ABDOMEN AND PELVIS WITHOUT CONTRAST TECHNIQUE: Multidetector CT imaging of the abdomen and pelvis was performed following the standard protocol without IV contrast. COMPARISON:  CT of the abdomen and pelvis performed 02/12/2016, MRI of the lumbar spine performed 02/01/2017, and renal ultrasound performed 01/31/2017 FINDINGS: Lower chest: Mild coronary artery calcifications are seen. Minimal bibasilar atelectasis is noted. A small hiatal hernia is noted. Hepatobiliary: The nodular contour of the liver is compatible with hepatic cirrhosis. Minimal soft tissue inflammation is noted about the gallbladder. The gallbladder is otherwise grossly unremarkable. The common bile duct remains normal in caliber. Pancreas: The pancreas is within normal limits. Spleen: The spleen is unremarkable in appearance. Adrenals/Urinary Tract: The adrenal glands are grossly unremarkable in appearance. Mild bilateral renal atrophy is noted, with nonspecific perinephric stranding noted bilaterally. There is no evidence of hydronephrosis. No renal or ureteral stones are identified. Stomach/Bowel: The stomach is unremarkable in appearance. The small bowel is within normal limits. The appendix is normal in caliber, without evidence of appendicitis. Mild soft tissue inflammation is suggested about the cecum, and trace soft tissue inflammation is seen along the paracolic gutters bilaterally.  There is question of additional soft tissue inflammation about the sigmoid colon. This raises concern for a mild infectious or inflammatory process. Vascular/Lymphatic: The abdominal aorta is unremarkable in appearance. The inferior vena cava is grossly unremarkable. No retroperitoneal lymphadenopathy is seen. Scattered mildly prominent bilateral pelvic sidewall nodes are seen, measuring up to 1.0 cm in size. Reproductive: Mild soft tissue inflammation about the bladder is nonspecific. The prostate is enlarged, measuring up to 5.8 cm in transverse dimension, with impression on the base of the bladder. The patient's Foley catheter is noted ending at the bladder. Other: No additional soft tissue abnormalities are seen. Musculoskeletal: No acute osseous abnormalities are identified. The visualized musculature is unremarkable in appearance. IMPRESSION: 1. Mild soft tissue inflammation suggested about the cecum, and trace soft tissue inflammation along the paracolic gutters bilaterally. Question of additional soft tissue inflammation about the sigmoid colon. This raises concern for a mild infectious or inflammatory process. 2. Minimal soft tissue inflammation about the gallbladder. The gallbladder is otherwise grossly unremarkable. Would correlate for any evidence of very mild cholecystitis. 3. Enlarged prostate,  with impression on the base of bladder. Would correlate with PSA. 4. Mildly prominent bilateral pelvic sidewall nodes, measuring up to 1.0 cm, slightly more prominent than in 2017. Metastatic disease cannot be excluded. 5. Findings of hepatic cirrhosis. 6. Mild coronary artery calcifications seen. 7. Small hiatal hernia noted. 8. Mild bowel renal atrophy. Electronically Signed   By: Garald Balding M.D.   On: 02/15/2017 06:52     Medications:    . heparin  5,000 Units Subcutaneous Q8H  . insulin aspart  0-5 Units Subcutaneous QHS  . insulin aspart  0-9 Units Subcutaneous TID WC  . metronidazole  500 mg  Intravenous Q8H  . sodium bicarbonate  650 mg Oral TID   hydrALAZINE, morphine injection, ondansetron **OR** ondansetron (ZOFRAN) IV, oxyCODONE  Assessment/ Plan:  [name] is a 76 y.o. Hispanic male Spanish speaking with diabetes mellitus type II, hypertension, hepatic cirrhosis, BPH, gout, diabetic neuropathy who was admitted to [facility] on 02/15/2017 for C. difficile colitis   1. Acute renal failure on chronic kidney disease stage V: with proteinuria, metabolic acidosis and hypoalbuminemia.  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 Baseline creatinine of 6.23, GFR of 8 on 02/11/17. Followed by Dr. Juanito Doom, [facility].  No acute indication for dialysis at this time.  - Discontinued IV fluids - Renally dose all medications - Discussed dialysis and diet with patient. Discussed vascular access.  - Continue sodium bicarbonate  2. Hypertension: blood pressure mildly elevated. With peripheral edema on examination.  Home regimen of amlodipine, furosemide, hydralazine, and metoprolol. Not on ACE-I/ARB due to advanced renal disease.   3. Diabetes mellitus type II with chronic kidney disease: insulin dependent - pending hemoglobin A1c.   4. Anemia of chronic kidney disease: gets outpatient Aranesp. Hemoglobin 8.2  5. Secondary Hyperparathyroidism: PTH from 2016 at 89. No recent PTH. Calcium and phosphorus at goal.  - pending PTH    LOS: [place], [place] 3/4/20189:43 AM

## 2017-02-16 NOTE — Progress Notes (Signed)
[name] [place] NAME: Martin Gilbert    MR#:  [id number]  DATE OF BIRTH:  12/18/1940  SUBJECTIVE:seen at bedside,admitted for colitis/cdiff/ Still has diarrhea ,4 tiimes since last night .says that his foley is bothering him ,having difficulty even getting up to sit on the side of bed to eat food. Spoke with help of translator.c/o some left lower quadrant abdominal pain.  CHIEF COMPLAINT:   Chief Complaint  Patient presents with  . Diarrhea  . Abdominal Pain    REVIEW OF SYSTEMS:   ROS CONSTITUTIONAL: has, fatigue or weakness.  EYES: No blurred or double vision.  EARS, NOSE, AND THROAT: No tinnitus or ear pain.  RESPIRATORY: No cough, shortness of breath, wheezing or hemoptysis.  CARDIOVASCULAR: No chest pain, orthopnea, edema.  GASTROINTESTINAL: has diarrhea and abdominal pain.  GENITOURINARY:has bladder spasms..  ENDOCRINE: No polyuria, nocturia,  HEMATOLOGY: No anemia, easy bruising or bleeding SKIN: No rash or lesion. MUSCULOSKELETAL: No joint pain or arthritis.   NEUROLOGIC: No tingling, numbness, weakness.  PSYCHIATRY: No anxiety or depression.   DRUG ALLERGIES:   Allergies  Allergen Reactions  . Acetaminophen Hives    Possible lip swelling.  Patient is not sure.  09/01/16- patient denies allergy  . Aspirin Rash    09/01/16- patient denies allergy    VITALS:  Blood pressure (!) 142/44, pulse 81, temperature 98.8 F (37.1 C), temperature source Oral, resp. rate 16, height 5\' 7"  (1.702 m), weight 86.2 kg (190 lb), SpO2 97 %.  PHYSICAL EXAMINATION:  GENERAL:  76 y.o.-year-old patient lying in the bed with no acute distress.  EYES: Pupils equal, round, reactive to light and accommodation. No scleral icterus. Extraocular muscles intact.  HEENT: Head atraumatic, normocephalic. Oropharynx and nasopharynx clear.  NECK:  Supple, no jugular venous distention. No thyroid enlargement, no tenderness.  LUNGS:  Normal breath sounds bilaterally, no wheezing, rales,rhonchi or crepitation. No use of accessory muscles of respiration.  CARDIOVASCULAR: S1, S2 normal. No murmurs, rubs, or gallops.  ABDOMEN: Soft, slight left lower quadrant tenderness . Bowel sounds present. No organomegaly or mass.  EXTREMITIES: No pedal edema, cyanosis, or clubbing.  NEUROLOGIC: Cranial nerves II through XII are intact. Muscle strength 5/5 in all extremities. Sensation intact. Gait not checked.  PSYCHIATRIC: The patient is alert and oriented x 3.  SKIN: No obvious rash, lesion, or ulcer.    LABORATORY PANEL:   CBC  Recent Labs Lab 02/16/17 0342  WBC 17.1*  HGB 8.2*  HCT 25.4*  PLT 355   ------------------------------------------------------------------------------------------------------------------  Chemistries   Recent Labs Lab 02/15/17 0343 02/16/17 0342  NA 136 140  K 4.1 4.4  CL 110 115*  CO2 17* 19*  GLUCOSE 148* 111*  BUN 62* 58*  CREATININE 6.21* 6.37*  CALCIUM 8.5* 8.3*  AST 19  --   ALT 12*  --   ALKPHOS 89  --   BILITOT 0.7  --    ------------------------------------------------------------------------------------------------------------------  Cardiac Enzymes No results for input(s): TROPONINI in the last 168 hours. ------------------------------------------------------------------------------------------------------------------  RADIOLOGY:  Ct Abdomen Pelvis Wo Contrast  Result Date: 02/15/2017 CLINICAL DATA:  Acute onset of generalized abdominal pain and diarrhea. Recent placement of catheter for urinary retention. Initial encounter. EXAM: CT ABDOMEN AND PELVIS WITHOUT CONTRAST TECHNIQUE: Multidetector CT imaging of the abdomen and pelvis was performed following the standard protocol without IV contrast. COMPARISON:  CT of the abdomen and pelvis performed 02/12/2016, MRI of the lumbar spine performed 02/01/2017, and renal  ultrasound performed 01/31/2017 FINDINGS: Lower chest: Mild  coronary artery calcifications are seen. Minimal bibasilar atelectasis is noted. A small hiatal hernia is noted. Hepatobiliary: The nodular contour of the liver is compatible with hepatic cirrhosis. Minimal soft tissue inflammation is noted about the gallbladder. The gallbladder is otherwise grossly unremarkable. The common bile duct remains normal in caliber. Pancreas: The pancreas is within normal limits. Spleen: The spleen is unremarkable in appearance. Adrenals/Urinary Tract: The adrenal glands are grossly unremarkable in appearance. Mild bilateral renal atrophy is noted, with nonspecific perinephric stranding noted bilaterally. There is no evidence of hydronephrosis. No renal or ureteral stones are identified. Stomach/Bowel: The stomach is unremarkable in appearance. The small bowel is within normal limits. The appendix is normal in caliber, without evidence of appendicitis. Mild soft tissue inflammation is suggested about the cecum, and trace soft tissue inflammation is seen along the paracolic gutters bilaterally. There is question of additional soft tissue inflammation about the sigmoid colon. This raises concern for a mild infectious or inflammatory process. Vascular/Lymphatic: The abdominal aorta is unremarkable in appearance. The inferior vena cava is grossly unremarkable. No retroperitoneal lymphadenopathy is seen. Scattered mildly prominent bilateral pelvic sidewall nodes are seen, measuring up to 1.0 cm in size. Reproductive: Mild soft tissue inflammation about the bladder is nonspecific. The prostate is enlarged, measuring up to 5.8 cm in transverse dimension, with impression on the base of the bladder. The patient's Foley catheter is noted ending at the bladder. Other: No additional soft tissue abnormalities are seen. Musculoskeletal: No acute osseous abnormalities are identified. The visualized musculature is unremarkable in appearance. IMPRESSION: 1. Mild soft tissue inflammation suggested about  the cecum, and trace soft tissue inflammation along the paracolic gutters bilaterally. Question of additional soft tissue inflammation about the sigmoid colon. This raises concern for a mild infectious or inflammatory process. 2. Minimal soft tissue inflammation about the gallbladder. The gallbladder is otherwise grossly unremarkable. Would correlate for any evidence of very mild cholecystitis. 3. Enlarged prostate, with impression on the base of bladder. Would correlate with PSA. 4. Mildly prominent bilateral pelvic sidewall nodes, measuring up to 1.0 cm, slightly more prominent than in 2017. Metastatic disease cannot be excluded. 5. Findings of hepatic cirrhosis. 6. Mild coronary artery calcifications seen. 7. Small hiatal hernia noted. 8. Mild bowel renal atrophy. Electronically Signed   By: Garald Balding M.D.   On: 02/15/2017 06:52    EKG:   Orders placed or performed during the hospital encounter of 01/30/17  . EKG 12-Lead  . EKG 12-Lead    ASSESSMENT AND PLAN:  [age] yr [age] male with cdiff.cloitis;on flagyl IV.continue ,add probiotic. 2.ckd stage 5;no  Acute indication for HD 3.htn;controlled 4.h/o BPH and urine  Re tension;has chronic foley,urology consulted due to patient c/p bladder spams and need for changing to smaller size foley.contiue Cardura 5.DMII;continue SSI,glipizide 6.HLP;continue statins 7.PT eval am D/w patient  And wife,used spanish interpreter.    All the records are reviewed and case discussed with Care Management/Social Workerr. Management plans discussed with the patient, family and they are in agreement.  CODE STATUS: full  TOTAL TIME TAKING CARE OF THIS PATIENT: 35 minutes.   POSSIBLE D/C IN 1-2DAYS, DEPENDING ON CLINICAL CONDITION.   Epifanio Lesches M.D on 02/16/2017 at 2:45 PM  Between 7am to 6pm - Pager - [phone]  After 6pm go to [email or web address] - password [email or web address] [facility]  Office  [phone]  CC: Primary care  physician; [name], MD   Note: This dictation  was prepared with Dragon dictation along with smaller phrase technology. Any transcriptional errors that result from this process are unintentional.

## 2017-02-16 NOTE — Progress Notes (Signed)
Notified Md of fever and allergies orders taken

## 2017-02-16 NOTE — Progress Notes (Signed)
Dr Ara Kussmaul aware pt not allergic to tylenol and pharmacy recommend giving this for fever based on his CKD instead of ibuprofen

## 2017-02-17 ENCOUNTER — Inpatient Hospital Stay: Payer: Medicare Other

## 2017-02-17 LAB — URINALYSIS, COMPLETE (UACMP) WITH MICROSCOPIC
BILIRUBIN URINE: NEGATIVE
GLUCOSE, UA: 50 mg/dL — AB
Hgb urine dipstick: NEGATIVE
Ketones, ur: NEGATIVE mg/dL
NITRITE: NEGATIVE
PH: 5 (ref 5.0–8.0)
Protein, ur: >=300 mg/dL — AB
SPECIFIC GRAVITY, URINE: 1.014 (ref 1.005–1.030)

## 2017-02-17 LAB — GLUCOSE, CAPILLARY
GLUCOSE-CAPILLARY: 101 mg/dL — AB (ref 65–99)
Glucose-Capillary: 140 mg/dL — ABNORMAL HIGH (ref 65–99)
Glucose-Capillary: 100 mg/dL — ABNORMAL HIGH (ref 65–99)
Glucose-Capillary: 139 mg/dL — ABNORMAL HIGH (ref 65–99)

## 2017-02-17 LAB — CBC
HCT: 23.3 % — ABNORMAL LOW (ref 40.0–52.0)
Hemoglobin: 7.6 g/dL — ABNORMAL LOW (ref 13.0–18.0)
MCH: 30.7 pg (ref 26.0–34.0)
MCHC: 32.7 g/dL (ref 32.0–36.0)
MCV: 93.9 fL (ref 80.0–100.0)
PLATELETS: 330 10*3/uL (ref 150–440)
RBC: 2.48 MIL/uL — AB (ref 4.40–5.90)
RDW: 15.2 % — ABNORMAL HIGH (ref 11.5–14.5)
WBC: 12.3 10*3/uL — AB (ref 3.8–10.6)

## 2017-02-17 NOTE — Care Management (Signed)
Patient admitted for colitis.  Patient lives at home with spouse.  Adult daughter available for support.  Reported that patient speaks "Achi".  Patient was provided a RW previous admission.  Referral for home health was made to [facility].  Per [place] with [facility] case was not opened.  Daughter Angie declined services, stating that her father had improved, and no longer "needed services".Marland Kitchen

## 2017-02-17 NOTE — Progress Notes (Signed)
Date: 02/17/2017,   MRN# [id number] Josearmando Kuhnert Sosa Mar 02, 1941 Code Status:     Code Status Orders        Start     Ordered   02/15/17 0912  Full code  Continuous     02/15/17 0911    Code Status History    Date Active Date Inactive Code Status Order ID Comments User Context   01/31/2017  2:36 AM 02/02/2017  8:22 PM Full Code [id number]  Saundra Shelling, MD ED     Hosp day:@LENGTHOFSTAYDAYS @ Referring MD: @ATDPROV @       CC: RUL 1 cm mass  GDJ:MEQA IS A 76 YO MALE, HERE WTH FEVER, LOOSE STOOLS. ON W/U NOTED TO HAVE 1 CM RUL NODULE.   PMHX:   Past Medical History:  Diagnosis Date  . Back pain   . BPH (benign prostatic hyperplasia)   . CKD (chronic kidney disease), stage IV ([place])   . Diabetes mellitus without complication ([place])   . HTN (hypertension)   . Hyperlipidemia    Surgical Hx:  Past Surgical History:  Procedure Laterality Date  . hernia repair     Family Hx:  Family History  Problem Relation Age of Onset  . Diabetes Neg Hx   . Hypertension Neg Hx    Social Hx:   Social History  Substance Use Topics  . Smoking status: Never Smoker  . Smokeless tobacco: Never Used  . Alcohol use No     Comment: Drank in the past, but has stopped for several years.    Medication:    Home Medication:    Current Medication: @CURMEDTAB @   Allergies:  Acetaminophen and Aspirin  Review of Systems: Gen:  Denies  fever, sweats, chills HEENT: Denies blurred vision, double vision, ear pain, eye pain, hearing loss, nose bleeds, sore throat Cvc:  No dizziness, chest pain or heaviness Resp:    Gi: Denies swallowing difficulty, stomach pain, nausea or vomiting, diarrhea, constipation, bowel incontinence Gu:  Denies bladder incontinence, burning urine Ext:   No Joint pain, stiffness or swelling Skin: No skin rash, easy bruising or bleeding or hives Endoc:  No polyuria, polydipsia , polyphagia or weight change Psych: No depression, insomnia or hallucinations  Other:  All  other systems negative  Physical Examination:   VS: BP (!) 111/44 (BP Location: Left Arm) Comment: MD aware  Pulse 72   Temp 99.1 F (37.3 C) (Oral)   Resp 17   Ht 5\' 7"  (1.702 m)   Wt 190 lb (86.2 kg)   SpO2 99%   BMI 29.76 kg/m   General Appearance: No distress  Neuro: without focal findings, mental status, speech normal, alert and oriented, cranial nerves 2-12 intact, reflexes normal and symmetric, sensation grossly normal  HEENT: PERRLA, EOM intact, no ptosis, no other lesions noticed, Mallampati: Pulmonary:.No wheezing, No rales  Sputum Production:   Cardiovascular:  Normal S1,S2.  No m/r/g.  Abdominal aorta pulsation normal.    Abdomen:Benign, Soft, non-tender, No masses, hepatosplenomegaly, No lymphadenopathy Endoc: No evident thyromegaly, no signs of acromegaly or Cushing features Skin:   warm, no rashes, no ecchymosis  Extremities: normal, no cyanosis, clubbing, no edema, warm with normal capillary refill. Other findings:   Labs results:   Recent Labs     02/15/17  0343  02/16/17  0342  02/17/17  1218  HGB  8.5*  8.2*  7.6*  HCT  25.9*  25.4*  23.3*  MCV  94.4  95.4  93.9  WBC  11.7*  17.1*  12.3*  BUN  62*  58*   --   CREATININE  6.21*  6.37*   --   GLUCOSE  148*  111*   --   CALCIUM  8.5*  8.3*   --   ,    No results for input(s): PH in the last 72 hours.  Invalid input(s): PCO2, PO2, BASEEXCESS, BASEDEFICITE, TFT  Culture results:     Rad results:   Dg Chest Port 1 View  Result Date: 02/17/2017 CLINICAL DATA:  Fever EXAM: PORTABLE CHEST 1 VIEW COMPARISON:  01/30/2017. FINDINGS: 0938 hours. The cardio pericardial silhouette is enlarged. Interstitial markings are diffusely coarsened with chronic features. 1 cm nodular density identified adjacent to the anterior right first rib and. This was not evident on prior studies from 09/01/2016 or 02/10/2015. Subsegmental atelectasis noted left lower lung. The visualized bony structures of the thorax are intact.  IMPRESSION: 1 cm right upper lobe pulmonary nodule. CT chest recommended to further evaluate. These results will be called to the ordering clinician or representative by the Radiologist Assistant, and communication documented in the PACS or zVision Dashboard. Electronically Signed   By: Misty Stanley M.D.   On: 02/17/2017 10:13   COMPARISON:  CT of the abdomen and pelvis performed 02/12/2016, MRI of the lumbar spine performed 02/01/2017, and renal ultrasound performed 01/31/2017  FINDINGS: Lower chest: Mild coronary artery calcifications are seen. Minimal bibasilar atelectasis is noted. A small hiatal hernia is noted.  Hepatobiliary: The nodular contour of the liver is compatible with hepatic cirrhosis. Minimal soft tissue inflammation is noted about the gallbladder. The gallbladder is otherwise grossly unremarkable. The common bile duct remains normal in caliber.  Pancreas: The pancreas is within normal limits.  Spleen: The spleen is unremarkable in appearance.  Adrenals/Urinary Tract: The adrenal glands are grossly unremarkable in appearance.  Mild bilateral renal atrophy is noted, with nonspecific perinephric stranding noted bilaterally. There is no evidence of hydronephrosis. No renal or ureteral stones are identified.  Stomach/Bowel: The stomach is unremarkable in appearance. The small bowel is within normal limits. The appendix is normal in caliber, without evidence of appendicitis.  Mild soft tissue inflammation is suggested about the cecum, and trace soft tissue inflammation is seen along the paracolic gutters bilaterally. There is question of additional soft tissue inflammation about the sigmoid colon. This raises concern for a mild infectious or inflammatory process.  Vascular/Lymphatic: The abdominal aorta is unremarkable in appearance. The inferior vena cava is grossly unremarkable. No retroperitoneal lymphadenopathy is seen.  Scattered mildly prominent  bilateral pelvic sidewall nodes are seen, measuring up to 1.0 cm in size.  Reproductive: Mild soft tissue inflammation about the bladder is nonspecific. The prostate is enlarged, measuring up to 5.8 cm in transverse dimension, with impression on the base of the bladder. The patient's Foley catheter is noted ending at the bladder.  Other: No additional soft tissue abnormalities are seen.  Musculoskeletal: No acute osseous abnormalities are identified. The visualized musculature is unremarkable in appearance.  IMPRESSION: 1. Mild soft tissue inflammation suggested about the cecum, and trace soft tissue inflammation along the paracolic gutters bilaterally. Question of additional soft tissue inflammation about the sigmoid colon. This raises concern for a mild infectious or inflammatory process. 2. Minimal soft tissue inflammation about the gallbladder. The gallbladder is otherwise grossly unremarkable. Would correlate for any evidence of very mild cholecystitis. 3. Enlarged prostate, with impression on the base of bladder. Would correlate with PSA. 4. Mildly prominent bilateral pelvic sidewall nodes, measuring  up to 1.0 cm, slightly more prominent than in 2017. Metastatic disease cannot be excluded. 5. Findings of hepatic cirrhosis. 6. Mild coronary artery calcifications seen. 7. Small hiatal hernia noted. 8. Mild bowel renal atrophy.   Electronically Signed   By: Garald Balding M.D.   On: 02/15/2017 06:52    Assessment and Plan: Asked to see regarding RUL nodule, 1  Cm. ? Etiology. ? Malignant,  Chest ct scan Tissue dx next most likely  Pelvic nodes ? Related,  As per above May need pet scan Check ace level, psa,  Further input after the chest ct scan  I have personally obtained a history, examined the patient, evaluated laboratory and imaging results, formulated the assessment and plan and placed orders.  The Patient requires high complexity decision making  for assessment and support, frequent evaluation and titration of therapies, application of advanced monitoring technologies and extensive interpretation of multiple databases.   Zacaria Pousson,M.D. Pulmonary & Critical care Medicine [facility] [place]

## 2017-02-17 NOTE — Progress Notes (Signed)
[facility]  ROUNDING NOTE   Subjective:   Wife, daughter and grandaughter at bedside. History taken with assistance of Spanish interpreter.   Complains about his foley catheter  Foul-smelling urine noted Patient also noted to have low-grade fever  serum creatinine remains critically elevated at 6.37./GFR 8 His chest x-ray shows 1 cm RUL lung nodule  Objective:  Vital signs in last 24 hours:  Temp:  [98.5 F (36.9 C)-102 F (38.9 C)] 99.1 F (37.3 C) (03/05 1336) Pulse Rate:  [72-87] 72 (03/05 1336) Resp:  [17-19] 17 (03/05 1336) BP: (111-130)/(38-50) 111/44 (03/05 1510) SpO2:  [95 %-99 %] 99 % (03/05 1336) Weight:  [79.5 kg (175 lb 3.2 oz)] 79.5 kg (175 lb 3.2 oz) (03/05 1710)  Weight change:  Filed Weights   02/15/17 0312 02/17/17 1710  Weight: 86.2 kg (190 lb) 79.5 kg (175 lb 3.2 oz)    Intake/Output: I/O last 3 completed shifts: In: 0  Out: 775 [Urine:775]   Intake/Output this shift:  Total I/O In: -  Out: 100 [Urine:100]  Physical Exam: General: NAD, laying in bed  Head: Normocephalic, atraumatic. Moist oral mucosal membranes  Eyes: Anicteric,  Neck: Supple, trachea midline  Lungs:  Clear to auscultation  Heart: Regular rate and rhythm  Abdomen:  Soft, tender to palpation  Extremities:  1+ peripheral edema.  Neurologic: Nonfocal, moving all four extremities  Skin: No lesions  Access: none    Basic Metabolic Panel:  Recent Labs Lab 02/15/17 0343 02/15/17 1426 02/16/17 0342  NA 136  --  140  K 4.1  --  4.4  CL 110  --  115*  CO2 17*  --  19*  GLUCOSE 148*  --  111*  BUN 62*  --  58*  CREATININE 6.21*  --  6.37*  CALCIUM 8.5*  --  8.3*  PHOS  --  4.0  --     Liver Function Tests:  Recent Labs Lab 02/15/17 0343  AST 19  ALT 12*  ALKPHOS 89  BILITOT 0.7  PROT 6.4*  ALBUMIN 2.7*    Recent Labs Lab 02/15/17 0343 02/16/17 0342  LIPASE 178* 62*   No results for input(s): AMMONIA in the last 168  hours.  CBC:  Recent Labs Lab 02/15/17 0343 02/16/17 0342 02/17/17 1218  WBC 11.7* 17.1* 12.3*  HGB 8.5* 8.2* 7.6*  HCT 25.9* 25.4* 23.3*  MCV 94.4 95.4 93.9  PLT 346 355 330    Cardiac Enzymes: No results for input(s): CKTOTAL, CKMB, CKMBINDEX, TROPONINI in the last 168 hours.  BNP: Invalid input(s): POCBNP  CBG:  Recent Labs Lab 02/16/17 1645 02/16/17 2109 02/17/17 0758 02/17/17 1135 02/17/17 1640  GLUCAP 121* 160* 101* 140* 100*    Microbiology: Results for orders placed or performed during the hospital encounter of 02/15/17  Gastrointestinal Panel by PCR , Stool     Status: None   Collection Time: 02/15/17  4:15 AM  Result Value Ref Range Status   Campylobacter species NOT DETECTED NOT DETECTED Final   Plesimonas shigelloides NOT DETECTED NOT DETECTED Final   Salmonella species NOT DETECTED NOT DETECTED Final   Yersinia enterocolitica NOT DETECTED NOT DETECTED Final   Vibrio species NOT DETECTED NOT DETECTED Final   Vibrio cholerae NOT DETECTED NOT DETECTED Final   Enteroaggregative E coli (EAEC) NOT DETECTED NOT DETECTED Final   Enteropathogenic E coli (EPEC) NOT DETECTED NOT DETECTED Final   Enterotoxigenic E coli (ETEC) NOT DETECTED NOT DETECTED Final   Shiga like toxin  producing E coli (STEC) NOT DETECTED NOT DETECTED Final   Shigella/Enteroinvasive E coli (EIEC) NOT DETECTED NOT DETECTED Final   Cryptosporidium NOT DETECTED NOT DETECTED Final   Cyclospora cayetanensis NOT DETECTED NOT DETECTED Final   Entamoeba histolytica NOT DETECTED NOT DETECTED Final   Giardia lamblia NOT DETECTED NOT DETECTED Final   Adenovirus F40/41 NOT DETECTED NOT DETECTED Final   Astrovirus NOT DETECTED NOT DETECTED Final   Norovirus GI/GII NOT DETECTED NOT DETECTED Final   Rotavirus A NOT DETECTED NOT DETECTED Final   Sapovirus (I, II, IV, and V) NOT DETECTED NOT DETECTED Final  C difficile quick scan w PCR reflex     Status: Abnormal   Collection Time: 02/15/17  4:15 AM   Result Value Ref Range Status   C Diff antigen POSITIVE (A) NEGATIVE Final   C Diff toxin POSITIVE (A) NEGATIVE Final   C Diff interpretation Toxin producing C. difficile detected.  Final    Comment: CRITICAL RESULT CALLED TO, READ BACK BY AND VERIFIED WITH: [facility] YUAL AT 1194 02/15/17.PMH   CULTURE, BLOOD (ROUTINE X 2) w Reflex to ID Panel     Status: None (Preliminary result)   Collection Time: 02/16/17  8:57 PM  Result Value Ref Range Status   Specimen Description BLOOD RIGHT ARM  Final   Special Requests BOTTLES DRAWN AEROBIC AND ANAEROBIC BCAV  Final   Culture NO GROWTH < 12 HOURS  Final   Report Status PENDING  Incomplete  CULTURE, BLOOD (ROUTINE X 2) w Reflex to ID Panel     Status: None (Preliminary result)   Collection Time: 02/16/17  9:00 PM  Result Value Ref Range Status   Specimen Description BLOOD LEFT ANTECUBITAL  Final   Special Requests BOTTLES DRAWN AEROBIC AND ANAEROBIC BCAV  Final   Culture NO GROWTH < 12 HOURS  Final   Report Status PENDING  Incomplete    Coagulation Studies: No results for input(s): LABPROT, INR in the last 72 hours.  Urinalysis:  Recent Labs  02/15/17 0343 02/17/17 1711  COLORURINE YELLOW* YELLOW*  LABSPEC 1.010 1.014  PHURINE 6.0 5.0  GLUCOSEU 50* 50*  HGBUR NEGATIVE NEGATIVE  BILIRUBINUR NEGATIVE NEGATIVE  KETONESUR NEGATIVE NEGATIVE  PROTEINUR >=300* >=300*  NITRITE NEGATIVE NEGATIVE  LEUKOCYTESUR NEGATIVE TRACE*      Imaging: Dg Chest Port 1 View  Result Date: 02/17/2017 CLINICAL DATA:  Fever EXAM: PORTABLE CHEST 1 VIEW COMPARISON:  01/30/2017. FINDINGS: 0938 hours. The cardio pericardial silhouette is enlarged. Interstitial markings are diffusely coarsened with chronic features. 1 cm nodular density identified adjacent to the anterior right first rib and. This was not evident on prior studies from 09/01/2016 or 02/10/2015. Subsegmental atelectasis noted left lower lung. The visualized bony structures of the thorax are intact.  IMPRESSION: 1 cm right upper lobe pulmonary nodule. CT chest recommended to further evaluate. These results will be called to the ordering clinician or representative by the Radiologist Assistant, and communication documented in the PACS or zVision Dashboard. Electronically Signed   By: Misty Stanley M.D.   On: 02/17/2017 10:13     Medications:    . amLODipine  2.5 mg Oral Daily  . doxazosin  2 mg Oral QHS  . feeding supplement (GLUCERNA SHAKE)  237 mL Oral TID BM  . ferrous sulfate  325 mg Oral Q breakfast  . heparin  5,000 Units Subcutaneous Q8H  . hydrALAZINE  50 mg Oral TID  . insulin aspart  0-5 Units Subcutaneous QHS  . insulin aspart  0-9 Units Subcutaneous TID WC  . metoprolol succinate  25 mg Oral Daily  . sodium bicarbonate  650 mg Oral TID  . vancomycin  125 mg Oral Q6H   gabapentin, hydrALAZINE, morphine injection, ondansetron **OR** ondansetron (ZOFRAN) IV, oxyCODONE  Assessment/ Plan:  [name] is a 76 y.o. Hispanic male Spanish speaking with diabetes mellitus type II, hypertension, hepatic cirrhosis, BPH, gout, diabetic neuropathy who was admitted to [facility] on 02/15/2017 for C. difficile colitis   1. Acute renal failure on chronic kidney disease stage V: with proteinuria, metabolic acidosis and hypoalbuminemia.  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 Baseline creatinine of 6.23, GFR of 8 on 02/11/17. Followed by Dr. Juanito Doom, [facility].  - S Creatinine remains critically elevated - Discontinued IV fluids - Renally dose all medications - Discuss possibility of dialysis with patient (especially if needed contrast studies)  2. Hypertension: blood pressure mildly elevated. With peripheral edema on examination.  Home regimen of amlodipine, furosemide, hydralazine, and metoprolol. Not on ACE-I/ARB due to advanced renal disease.   3. Diabetes mellitus type II with chronic kidney disease: insulin dependent - Hemoglobin A1c.   5.1 %  4. Anemia of chronic kidney disease: gets outpatient Aranesp. Hemoglobin 7.6   5. RUL lung nodule - Pulmonary evaluation pending   LOS: 2 Jolyssa Oplinger 3/5/20185:43 PM

## 2017-02-17 NOTE — Progress Notes (Addendum)
[place] at [place] NAME: Martin Gilbert    MR#:  [id number]  DATE OF BIRTH:  May 05, 1941  SUBJECTIVE:seen at bedside,admitted for colitis/cdiff/ Still has diarrhea watery 5 tiimes last night. Had nausea and vomiting this morning. Patient to spike fever 102 Fahrenheit last night.  bloodCultures are done. Chest x-ray done this morning shows right upper lobe nodule size 1 cm. Woke with the help of a Patent attorney.   CHIEF COMPLAINT:   Chief Complaint  Patient presents with  . Diarrhea  . Abdominal Pain    REVIEW OF SYSTEMS:   ROS CONSTITUTIONAL: has, fatigue or weakness.  EYES: No blurred or double vision.  EARS, NOSE, AND THROAT: No tinnitus or ear pain.  RESPIRATORY: No cough, shortness of breath, wheezing or hemoptysis.  CARDIOVASCULAR: No chest pain, orthopnea, edema.  GASTROINTESTINAL: has diarrhea and abdominal pain.  GENITOURINARY:has bladder spasms..  ENDOCRINE: No polyuria, nocturia,  HEMATOLOGY: No anemia, easy bruising or bleeding SKIN: No rash or lesion. MUSCULOSKELETAL: No joint pain or arthritis.   NEUROLOGIC: No tingling, numbness, weakness.  PSYCHIATRY: No anxiety or depression.   DRUG ALLERGIES:   Allergies  Allergen Reactions  . Acetaminophen Hives    Possible lip swelling.  Patient is not sure.  09/01/16- patient denies allergy  . Aspirin Rash    09/01/16- patient denies allergy    VITALS:  Blood pressure (!) 129/50, pulse 72, temperature 99.1 F (37.3 C), temperature source Oral, resp. rate 19, height 5\' 7"  (1.702 m), weight 86.2 kg (190 lb), SpO2 98 %.  PHYSICAL EXAMINATION:  GENERAL:  76 y.o.-year-old patient lying in the bed with no acute distress.  EYES: Pupils equal, round, reactive to light and accommodation. No scleral icterus. Extraocular muscles intact.  HEENT: Head atraumatic, normocephalic. Oropharynx and nasopharynx clear.  NECK:  Supple, no jugular venous distention. No  thyroid enlargement, no tenderness.  LUNGS: Normal breath sounds bilaterally, no wheezing, rales,rhonchi or crepitation. No use of accessory muscles of respiration.  CARDIOVASCULAR: S1, S2 normal. No murmurs, rubs, or gallops.  ABDOMEN: Soft, slight left lower quadrant tenderness . Bowel sounds present. No organomegaly or mass.  EXTREMITIES: No pedal edema, cyanosis, or clubbing.  NEUROLOGIC: Cranial nerves II through XII are intact. Muscle strength 5/5 in all extremities. Sensation intact. Gait not checked.  PSYCHIATRIC: The patient is alert and oriented x 3.  SKIN: No obvious rash, lesion, or ulcer.    LABORATORY PANEL:   CBC  Recent Labs Lab 02/16/17 0342  WBC 17.1*  HGB 8.2*  HCT 25.4*  PLT 355   ------------------------------------------------------------------------------------------------------------------  Chemistries   Recent Labs Lab 02/15/17 0343 02/16/17 0342  NA 136 140  K 4.1 4.4  CL 110 115*  CO2 17* 19*  GLUCOSE 148* 111*  BUN 62* 58*  CREATININE 6.21* 6.37*  CALCIUM 8.5* 8.3*  AST 19  --   ALT 12*  --   ALKPHOS 89  --   BILITOT 0.7  --    ------------------------------------------------------------------------------------------------------------------  Cardiac Enzymes No results for input(s): TROPONINI in the last 168 hours. ------------------------------------------------------------------------------------------------------------------  RADIOLOGY:  Dg Chest Port 1 View  Result Date: 02/17/2017 CLINICAL DATA:  Fever EXAM: PORTABLE CHEST 1 VIEW COMPARISON:  01/30/2017. FINDINGS: 0938 hours. The cardio pericardial silhouette is enlarged. Interstitial markings are diffusely coarsened with chronic features. 1 cm nodular density identified adjacent to the anterior right first rib and. This was not evident on prior studies from 09/01/2016 or 02/10/2015. Subsegmental atelectasis noted left  lower lung. The visualized bony structures of the thorax are  intact. IMPRESSION: 1 cm right upper lobe pulmonary nodule. CT chest recommended to further evaluate. These results will be called to the ordering clinician or representative by the Radiologist Assistant, and communication documented in the PACS or zVision Dashboard. Electronically Signed   By: Misty Stanley M.D.   On: 02/17/2017 10:13    EKG:   Orders placed or performed during the hospital encounter of 01/30/17  . EKG 12-Lead  . EKG 12-Lead    ASSESSMENT AND PLAN:  [age] yr [age] male with cdiff.cloitis;still has diarrhea, consult gastroenterology, added  mouth vancomycin   2.ckd stage 5;no  Acute indication for HD 3.htn;   bp is borderline this morning, hold her morning blood pressure medications. 4.h/o BPH and urine  Retention;;has chronic foley,urology consulted . Patient has urology appointment tomorrow for removal of Foley catheter. Foley changed to leg bag yesterday, patient more comfortable today but because patient is  In  Hospital for C. difficile colitis patient needs to be seen by urology to evaluate for follow up on urine retention.   5.DMII;continue SSI,glipizide 6.HLP;continue statins 7.fever;follow blood cultures,chest xray showed right upper lobe nodule needs CT chest with contrast.Spoke with nephro  About this as pt has ckd stage 5 and  Possible need for HD  8..liver cirrhosis seen on CT scan;no symptoms / All the records are reviewed and case discussed with Care Management/Social Workerr. Management plans discussed with the patient, family and they are in agreement.  CODE STATUS: full  TOTAL TIME TAKING CARE OF THIS PATIENT: 35 minutes.   POSSIBLE D/C IN 1-2DAYS, DEPENDING ON CLINICAL CONDITION.   Epifanio Lesches M.D on 02/17/2017 at 11:56 AM  Between 7am to 6pm - Pager - [phone]  After 6pm go to [email or web address] - password [email or web address] [facility]  Office  [phone]  CC: Primary care physician; [name], MD   Note:  This dictation was prepared with Dragon dictation along with smaller phrase technology. Any transcriptional errors that result from this process are unintentional.

## 2017-02-17 NOTE — Progress Notes (Signed)
Interpreter in to assist with communication at shift assessment; patient denies questions, pain or any distress at this time. [name], RN 8:26 PM 02/17/2017

## 2017-02-17 NOTE — Progress Notes (Signed)
Initial Nutrition Assessment  DOCUMENTATION CODES:   Severe malnutrition in context of acute illness/injury  INTERVENTION:  Encouraged adequate intake through small, frequent meals due to patient's poor appetite. RD to order snacks between meals.  Continue Glucerna Shake po TID as previously ordered, each supplement provides 220 kcal and 10 grams of protein.  Reviewed handout on Renal Diet in Spanish with patient and family with help of translator. Due to history of hypertension, encouraged low sodium intake to preserve kidney function.  NUTRITION DIAGNOSIS:   Inadequate oral intake related to poor appetite, nausea, vomiting, other (see comment) (diarrhea, abdominal pain) as evidenced by per patient/family report, percent weight loss.  GOAL:   Patient will meet greater than or equal to 90% of their needs  MONITOR:   PO intake, Supplement acceptance, Labs, Weight trends, I & O's  REASON FOR ASSESSMENT:   Consult Assessment of nutrition requirement/status  ASSESSMENT:   76 year old male with PMHx of CKD (now stage V but no acute need for HD), HTN, DM type 2, HLD presented with diarrhea, N/V, abdominal pain found to have C. Diff colitis.   RD received consult to assess nutrition requirements/status. Also in comments of consult asked to complete education with patient/family on diet for Kidney Disease and Diabetes Mellitus.   Spoke with patient and family members at bedside with help of translator. Patient reports his appetite has been poor for the past 2 months. He reports he has been attempting to eat 2 meals per day but is not able to finish his meals. Reports eating 2-3 tortillas with beans, fish, or eggs and broth. He reports even if he finishes a meal at home he has bad nausea and has an episode of vomiting shortly after meals. Patient also endorses diarrhea and abdominal pain. Encouraged patient to ask for PRN Zofran when he is having nausea. Instead of drinking Glucerna shakes  at home, patient reports he would prefer to eat small, frequent meals. Reviewed appropriate options for patient to have at home. He reports he typically cooks for himself at home from fresh ingredients and he does not like what is on the menu here.  Patient reports UBW 180-190 lbs and he does not believe he has lost weight. RD obtained bed scale weight of 175.2 lbs so patient has lost 14.8 lbs (7.8% body weight) over 2 months, which is significant for time frame.  Medications reviewed and include: ferrous sulfate 325 mg daily, Novolog sliding scale TID with meals and daily at bedtime, vancomycin, Zofran 4 mg Q6hrs PRN.   Labs reviewed: CBG 101-160 past 24 hrs, Chloride 115, CO2 19, BUN 58, Creatinine 6.37, Lipase 62, EGFR 8. Potassium and Phosphorus WNL. HgbA1c 5.2 on 3/3.   Nutrition-Focused physical exam completed. Findings are mild-moderate fat depletion, mild-moderate muscle depletion, and mild pitting edema.   Discussed with RN.   Diet Order:  Diet renal with fluid restriction Fluid restriction: 1200 mL Fluid; Room service appropriate? Yes; Fluid consistency: Thin  Skin:  Reviewed, no issues  Last BM:  02/17/2017  Height:   Ht Readings from Last 1 Encounters:  02/15/17 '5\' 7"'  (1.702 m)    Weight:   Wt Readings from Last 1 Encounters:  02/17/17 175 lb 3.2 oz (79.5 kg)    Ideal Body Weight:  67.3 kg  BMI:  Body mass index is 27.44 kg/m.  Estimated Nutritional Needs:   Kcal:  9629-5284 (MSJ x 1.2-1.4)  Protein:  65-75 grams (0.8-0.9 grams/kg)  Fluid:  UOP + 1  L  EDUCATION NEEDS:   Education needs addressed  Willey Blade, MS, RD, LDN Pager: [phone] After Hours Pager: [phone]

## 2017-02-17 NOTE — Progress Notes (Deleted)
[place] at [place] NAME: Martin Gilbert    MR#:  [id number]  DATE OF BIRTH:  October 31, 1941  SUBJECTIVE:seen at bedside,admitted for colitis/cdiff/ Still has diarrhea watery 5 tiimes last night. Had nausea and vomiting this morning. Patient to spike fever 102 Fahrenheit last night.  bloodCultures are done. Chest x-ray done this morning shows right upper lobe nodule size 1 cm. Woke with the help of a Patent attorney.   CHIEF COMPLAINT:   Chief Complaint  Patient presents with  . Diarrhea  . Abdominal Pain    REVIEW OF SYSTEMS:   ROS CONSTITUTIONAL: has, fatigue or weakness.  EYES: No blurred or double vision.  EARS, NOSE, AND THROAT: No tinnitus or ear pain.  RESPIRATORY: No cough, shortness of breath, wheezing or hemoptysis.  CARDIOVASCULAR: No chest pain, orthopnea, edema.  GASTROINTESTINAL: has diarrhea and abdominal pain.  GENITOURINARY:has bladder spasms..  ENDOCRINE: No polyuria, nocturia,  HEMATOLOGY: No anemia, easy bruising or bleeding SKIN: No rash or lesion. MUSCULOSKELETAL: No joint pain or arthritis.   NEUROLOGIC: No tingling, numbness, weakness.  PSYCHIATRY: No anxiety or depression.   DRUG ALLERGIES:   Allergies  Allergen Reactions  . Acetaminophen Hives    Possible lip swelling.  Patient is not sure.  09/01/16- patient denies allergy  . Aspirin Rash    09/01/16- patient denies allergy    VITALS:  Blood pressure (!) 129/50, pulse 72, temperature 99.1 F (37.3 C), temperature source Oral, resp. rate 19, height 5\' 7"  (1.702 m), weight 86.2 kg (190 lb), SpO2 98 %.  PHYSICAL EXAMINATION:  GENERAL:  76 y.o.-year-old patient lying in the bed with no acute distress.  EYES: Pupils equal, round, reactive to light and accommodation. No scleral icterus. Extraocular muscles intact.  HEENT: Head atraumatic, normocephalic. Oropharynx and nasopharynx clear.  NECK:  Supple, no jugular venous distention. No  thyroid enlargement, no tenderness.  LUNGS: Normal breath sounds bilaterally, no wheezing, rales,rhonchi or crepitation. No use of accessory muscles of respiration.  CARDIOVASCULAR: S1, S2 normal. No murmurs, rubs, or gallops.  ABDOMEN: Soft, slight left lower quadrant tenderness . Bowel sounds present. No organomegaly or mass.  EXTREMITIES: No pedal edema, cyanosis, or clubbing.  NEUROLOGIC: Cranial nerves II through XII are intact. Muscle strength 5/5 in all extremities. Sensation intact. Gait not checked.  PSYCHIATRIC: The patient is alert and oriented x 3.  SKIN: No obvious rash, lesion, or ulcer.    LABORATORY PANEL:   CBC  Recent Labs Lab 02/16/17 0342  WBC 17.1*  HGB 8.2*  HCT 25.4*  PLT 355   ------------------------------------------------------------------------------------------------------------------  Chemistries   Recent Labs Lab 02/15/17 0343 02/16/17 0342  NA 136 140  K 4.1 4.4  CL 110 115*  CO2 17* 19*  GLUCOSE 148* 111*  BUN 62* 58*  CREATININE 6.21* 6.37*  CALCIUM 8.5* 8.3*  AST 19  --   ALT 12*  --   ALKPHOS 89  --   BILITOT 0.7  --    ------------------------------------------------------------------------------------------------------------------  Cardiac Enzymes No results for input(s): TROPONINI in the last 168 hours. ------------------------------------------------------------------------------------------------------------------  RADIOLOGY:  Dg Chest Port 1 View  Result Date: 02/17/2017 CLINICAL DATA:  Fever EXAM: PORTABLE CHEST 1 VIEW COMPARISON:  01/30/2017. FINDINGS: 0938 hours. The cardio pericardial silhouette is enlarged. Interstitial markings are diffusely coarsened with chronic features. 1 cm nodular density identified adjacent to the anterior right first rib and. This was not evident on prior studies from 09/01/2016 or 02/10/2015. Subsegmental atelectasis noted left  lower lung. The visualized bony structures of the thorax are  intact. IMPRESSION: 1 cm right upper lobe pulmonary nodule. CT chest recommended to further evaluate. These results will be called to the ordering clinician or representative by the Radiologist Assistant, and communication documented in the PACS or zVision Dashboard. Electronically Signed   By: Misty Stanley M.D.   On: 02/17/2017 10:13    EKG:   Orders placed or performed during the hospital encounter of 01/30/17  . EKG 12-Lead  . EKG 12-Lead    ASSESSMENT AND PLAN:  [age] yr old male with cdiff.cloitis;still has diarrhea, consult gastroenterology, add mouth vancomycin   2.ckd stage 5;no  Acute indication for HD 3.htn;   bp is borderline this morning, hold her morning blood pressure medications. 4.h/o BPH and urine  Retention;;has chronic foley,urology consulted . Patient has urology appointment tomorrow for removal of Foley catheter. Foley changed to leg bag yesterday, patient more comfortable today but because patient is  In  Hospital for C. difficile colitis patient needs to be seen by urology to evaluate for follow up on urine retention.   5.DMII;continue SSI,glipizide 6.HLP;continue statins 7.fever;follow blood cultures,chest xray showed right upper lobe nodule needs CT chest with contrast.Spoke with nephro  About this as pt has ckd stage 5 and  Possible need for HD  All the records are reviewed and case discussed with Care Management/Social Workerr. Management plans discussed with the patient, family and they are in agreement. D/w family with help of spanish translator, CODE STATUS: full  TOTAL TIME TAKING CARE OF THIS PATIENT: 35 minutes.   POSSIBLE D/C IN 1-2DAYS, DEPENDING ON CLINICAL CONDITION.   Epifanio Lesches M.D on 02/17/2017 at 11:46 AM  Between 7am to 6pm - Pager - [phone]  After 6pm go to [email or web address] - password [email or web address] [facility]  Office  [phone]  CC: Primary care physician; [name], MD   Note: This  dictation was prepared with Dragon dictation along with smaller phrase technology. Any transcriptional errors that result from this process are unintentional.

## 2017-02-17 NOTE — Care Management Important Message (Signed)
Important Message  Patient Details  Name: Martin Gilbert MRN: [id number] Date of Birth: December 27, 1940   Medicare Important Message Given:  Yes    Beverly Sessions, RN 02/17/2017, 2:18 PM

## 2017-02-17 NOTE — Progress Notes (Signed)
Dr. Vianne Bulls notified of pt.'s current BP being 129/50 and HR 72, pt has due Norvasc 2.5 mg PO, hydralazine 50mg  PO, and Toprol-XL 25 mg PO. New orders to hold at this time. Dr. Vianne Bulls also made aware that pt spiked a fever of 102 last night at 2021, current temp is 99.1 at 0900 02/17/2017. New verbal order to place chest x-ray portable view. Will continue to monitor pt.  Jakaiden Fill [employer]

## 2017-02-17 NOTE — Progress Notes (Addendum)
Verbal order from Dr. Vianne Bulls to hold hydralazine PO, current BP 111/44. Also, verbal order to collect urinalysis.   Martin Gilbert [employer]

## 2017-02-18 ENCOUNTER — Inpatient Hospital Stay: Payer: Medicare Other

## 2017-02-18 ENCOUNTER — Ambulatory Visit: Payer: Self-pay | Admitting: Urology

## 2017-02-18 DIAGNOSIS — R338 Other retention of urine: Secondary | ICD-10-CM

## 2017-02-18 DIAGNOSIS — A0472 Enterocolitis due to Clostridium difficile, not specified as recurrent: Secondary | ICD-10-CM

## 2017-02-18 DIAGNOSIS — E43 Unspecified severe protein-calorie malnutrition: Secondary | ICD-10-CM | POA: Insufficient documentation

## 2017-02-18 DIAGNOSIS — N401 Enlarged prostate with lower urinary tract symptoms: Secondary | ICD-10-CM

## 2017-02-18 LAB — GLUCOSE, CAPILLARY
GLUCOSE-CAPILLARY: 129 mg/dL — AB (ref 65–99)
GLUCOSE-CAPILLARY: 195 mg/dL — AB (ref 65–99)
Glucose-Capillary: 92 mg/dL (ref 65–99)
Glucose-Capillary: 142 mg/dL — ABNORMAL HIGH (ref 65–99)

## 2017-02-18 LAB — PARATHYROID HORMONE, INTACT (NO CA): PTH: 59 pg/mL (ref 15–65)

## 2017-02-18 MED ORDER — TAMSULOSIN HCL 0.4 MG PO CAPS
0.4000 mg | ORAL_CAPSULE | Freq: Every day | ORAL | Status: DC
  Administered 2017-02-18: 0.4 mg via ORAL
  Filled 2017-02-18: qty 1

## 2017-02-18 MED ORDER — CEPHALEXIN 500 MG PO CAPS
500.0000 mg | ORAL_CAPSULE | Freq: Once | ORAL | Status: AC
  Administered 2017-02-18: 500 mg via ORAL
  Filled 2017-02-18: qty 1

## 2017-02-18 MED ORDER — ATORVASTATIN CALCIUM 20 MG PO TABS
20.0000 mg | ORAL_TABLET | Freq: Every day | ORAL | Status: DC
  Administered 2017-02-18: 20 mg via ORAL
  Filled 2017-02-18: qty 1

## 2017-02-18 NOTE — Plan of Care (Signed)
Problem: Bowel/Gastric: Goal: Will not experience complications related to bowel motility Outcome: Progressing Diarrhea lessening.

## 2017-02-18 NOTE — Consult Note (Addendum)
Consult: Urinary retention, BPH Requested by: Dr. Vianne Bulls  History of Present Illness: 76 year old male admitted with C. difficile colitis. He was seen about 3 weeks ago in the emergency department with a septic picture and admitted for urinary tract infection. Urine culture grew Escherichia coli. He was treated for antibiotics and discharged. He returned 2 days later on 02/03/2017 to the emergency department with urinary retention. A Foley catheter was placed and he was discharged. His Foley catheter "fell out" and was replaced February 22. He was readmitted 2 days ago C. difficile colitis and diarrhea. CT scan of the abdomen and pelvis was obtained which showed atrophic kidneys. No hydronephrosis. Prostate was about 75 g. His diarrhea has improved. Urology was consulted for the retention. He has a history of BPH and followed with Dr. [name]. He had an episode of urinary retention in 2014 and took tamsulosin during this time. He's currently on cardura 2 mg. He is feeling better today and has no complaints. Catheter is draining normally. Urine clear. I called and had his daughter on the phone who served as interpreter. He has stage V chronic kidney disease.   Past Medical History:  Diagnosis Date  . Back pain   . BPH (benign prostatic hyperplasia)   . CKD (chronic kidney disease), stage IV ([place])   . Diabetes mellitus without complication ([place])   . HTN (hypertension)   . Hyperlipidemia    Past Surgical History:  Procedure Laterality Date  . hernia repair      Home Medications:  Prescriptions Prior to Admission  Medication Sig Dispense Refill Last Dose  . acetaminophen (TYLENOL) 500 MG tablet Take 500 mg by mouth every 8 (eight) hours as needed.   PRN at PRN  . allopurinol (ZYLOPRIM) 100 MG tablet Take 100 mg by mouth daily.   UNKNOWN at UNKNOWN  . amLODipine (NORVASC) 2.5 MG tablet Take 2.5 mg by mouth daily.   UNKNOWN at UNKNOWN  . aspirin 81 MG tablet Take 81 mg by mouth daily.   UNKNOWN  at UNKNOWN  . atorvastatin (LIPITOR) 20 MG tablet Take 20 mg by mouth daily.   UNKNOWN at UNKNOWN  . doxazosin (CARDURA) 2 MG tablet Take 2 mg by mouth at bedtime.    UNKNOWN at UNKNOWN  . ferrous sulfate 325 (65 FE) MG tablet Take 325 mg by mouth daily with breakfast.  11 UNKNOWN at UNKNOWN  . furosemide (LASIX) 40 MG tablet Take 40 mg by mouth daily.   UNKNOWN at UNKNOWN  . gabapentin (NEURONTIN) 100 MG capsule Take 1 capsule (100 mg total) by mouth every 8 (eight) hours as needed (tingling or numbness in feet). 90 capsule 0 UNKNOWN at UNKNOWN  . glipiZIDE (GLUCOTROL) 5 MG tablet Take 5 mg by mouth daily.   UNKNOWN at UNKNOWN  . hydrALAZINE (APRESOLINE) 50 MG tablet Take 50 mg by mouth 3 (three) times daily.   UNKNOWN at UNKNOWN  . metoprolol succinate (TOPROL-XL) 25 MG 24 hr tablet Take 25 mg by mouth daily.   UNKNOWN at [facility]  . Multiple Vitamin (MULTI-VITAMINS) TABS Take 1 tablet by mouth daily.   UNKNOWN at UNKNOWN  . pantoprazole (PROTONIX) 40 MG tablet Take 1 tablet (40 mg total) by mouth daily. 30 tablet 6 UNKNOWN at UNKNOWN  . Polyvinyl Alcohol-Povidone (REFRESH OP) Place 1 drop into both eyes 2 (two) times daily.   UNKNOWN at UNKNOWN  . senna-docusate (SENOKOT-S) 8.6-50 MG tablet Take 1 tablet by mouth at bedtime as needed for mild constipation. [place]  tablet 6 UNKNOWN at UNKNOWN  . sodium bicarbonate 650 MG tablet Take 650 mg by mouth 2 (two) times daily.    UNKNOWN at UNKNOWN  . Vitamin D, Cholecalciferol, 1000 UNITS TABS Take by mouth.   UNKNOWN at UNKNOWN  . baclofen (LIORESAL) 10 MG tablet Take 1 tablet (10 mg total) by mouth 3 (three) times daily. (Patient not taking: Reported on 01/30/2017) 30 tablet 0 Not Taking at Unknown time  . carbamide peroxide (DEBROX) 6.5 % otic solution Place 5 drops into the right ear 2 (two) times daily. (Patient not taking: Reported on 01/30/2017) 15 mL 2 Not Taking at Unknown time  . feeding supplement, GLUCERNA SHAKE, (GLUCERNA SHAKE) LIQD Take 237 mLs  by mouth 3 (three) times daily between meals. 90 Can 6   . ondansetron (ZOFRAN) 4 MG tablet Take 1 tablet (4 mg total) by mouth every 6 (six) hours as needed for nausea. 20 tablet 0   . oxyCODONE (ROXICODONE) 5 MG immediate release tablet Take 1 tablet (5 mg total) by mouth every 8 (eight) hours as needed. (Patient not taking: Reported on 01/30/2017) 20 tablet 0 Not Taking at Unknown time   Allergies:  Allergies  Allergen Reactions  . Acetaminophen Hives    Possible lip swelling.  Patient is not sure.  09/01/16- patient denies allergy  . Aspirin Rash    09/01/16- patient denies allergy    Family History  Problem Relation Age of Onset  . Diabetes Neg Hx   . Hypertension Neg Hx    Social History:  reports that he has never smoked. He has never used smokeless tobacco. He reports that he does not drink alcohol or use drugs.  ROS: A complete review of systems was performed.  All systems are negative except for pertinent findings as noted. Review of Systems  All other systems reviewed and are negative.    Physical Exam:  Vital signs in last 24 hours: Temp:  [98 F (36.7 C)-98.7 F (37.1 C)] 98 F (36.7 C) (03/06 1139) Pulse Rate:  [66-73] 66 (03/06 1139) Resp:  [17-18] 17 (03/06 1139) BP: (106-145)/(34-56) 138/46 (03/06 1139) SpO2:  [97 %-98 %] 98 % (03/06 1139) Weight:  [79.5 kg (175 lb 3.2 oz)] 79.5 kg (175 lb 3.2 oz) (03/05 1710) General:  Alert and oriented, No acute distress HEENT: Normocephalic, atraumatic Neck: No JVD or lymphadenopathy Cardiovascular: Regular rate and rhythm Lungs: Regular rate and effort Abdomen: Soft, nontender, nondistended, no abdominal masses Back: No CVA tenderness Extremities: No edema Neurologic: Grossly intact GU: urine clear   Laboratory Data:  Results for orders placed or performed during the hospital encounter of 02/15/17 (from the past 24 hour(s))  Glucose, capillary     Status: Abnormal   Collection Time: 02/17/17  4:40 PM  Result  Value Ref Range   Glucose-Capillary 100 (H) 65 - 99 mg/dL   Comment 1 Notify RN   Urinalysis, Complete w Microscopic     Status: Abnormal   Collection Time: 02/17/17  5:11 PM  Result Value Ref Range   Color, Urine YELLOW (A) YELLOW   APPearance HAZY (A) CLEAR   Specific Gravity, Urine 1.014 1.005 - 1.030   pH 5.0 5.0 - 8.0   Glucose, UA 50 (A) NEGATIVE mg/dL   Hgb urine dipstick NEGATIVE NEGATIVE   Bilirubin Urine NEGATIVE NEGATIVE   Ketones, ur NEGATIVE NEGATIVE mg/dL   Protein, ur >=300 (A) NEGATIVE mg/dL   Nitrite NEGATIVE NEGATIVE   Leukocytes, UA TRACE (A) NEGATIVE  RBC / HPF 6-30 0 - 5 RBC/hpf   WBC, UA 6-30 0 - 5 WBC/hpf   Bacteria, UA RARE (A) NONE SEEN   Squamous Epithelial / LPF 0-5 (A) NONE SEEN   Mucous PRESENT    Budding Yeast PRESENT   Glucose, capillary     Status: Abnormal   Collection Time: 02/17/17  9:36 PM  Result Value Ref Range   Glucose-Capillary 139 (H) 65 - 99 mg/dL   Comment 1 Notify RN   Glucose, capillary     Status: None   Collection Time: 02/18/17  7:38 AM  Result Value Ref Range   Glucose-Capillary 92 65 - 99 mg/dL   Comment 1 Notify RN   Glucose, capillary     Status: Abnormal   Collection Time: 02/18/17 11:37 AM  Result Value Ref Range   Glucose-Capillary 142 (H) 65 - 99 mg/dL   Comment 1 Notify RN    Recent Results (from the past 240 hour(s))  Gastrointestinal Panel by PCR , Stool     Status: None   Collection Time: 02/15/17  4:15 AM  Result Value Ref Range Status   Campylobacter species NOT DETECTED NOT DETECTED Final   Plesimonas shigelloides NOT DETECTED NOT DETECTED Final   Salmonella species NOT DETECTED NOT DETECTED Final   Yersinia enterocolitica NOT DETECTED NOT DETECTED Final   Vibrio species NOT DETECTED NOT DETECTED Final   Vibrio cholerae NOT DETECTED NOT DETECTED Final   Enteroaggregative E coli (EAEC) NOT DETECTED NOT DETECTED Final   Enteropathogenic E coli (EPEC) NOT DETECTED NOT DETECTED Final   Enterotoxigenic E  coli (ETEC) NOT DETECTED NOT DETECTED Final   Shiga like toxin producing E coli (STEC) NOT DETECTED NOT DETECTED Final   Shigella/Enteroinvasive E coli (EIEC) NOT DETECTED NOT DETECTED Final   Cryptosporidium NOT DETECTED NOT DETECTED Final   Cyclospora cayetanensis NOT DETECTED NOT DETECTED Final   Entamoeba histolytica NOT DETECTED NOT DETECTED Final   Giardia lamblia NOT DETECTED NOT DETECTED Final   Adenovirus F40/41 NOT DETECTED NOT DETECTED Final   Astrovirus NOT DETECTED NOT DETECTED Final   Norovirus GI/GII NOT DETECTED NOT DETECTED Final   Rotavirus A NOT DETECTED NOT DETECTED Final   Sapovirus (I, II, IV, and V) NOT DETECTED NOT DETECTED Final  C difficile quick scan w PCR reflex     Status: Abnormal   Collection Time: 02/15/17  4:15 AM  Result Value Ref Range Status   C Diff antigen POSITIVE (A) NEGATIVE Final   C Diff toxin POSITIVE (A) NEGATIVE Final   C Diff interpretation Toxin producing C. difficile detected.  Final    Comment: CRITICAL RESULT CALLED TO, READ BACK BY AND VERIFIED WITH: [facility] YUAL AT 0240 02/15/17.PMH   CULTURE, BLOOD (ROUTINE X 2) w Reflex to ID Panel     Status: None (Preliminary result)   Collection Time: 02/16/17  8:57 PM  Result Value Ref Range Status   Specimen Description BLOOD RIGHT ARM  Final   Special Requests BOTTLES DRAWN AEROBIC AND ANAEROBIC BCAV  Final   Culture NO GROWTH 2 DAYS  Final   Report Status PENDING  Incomplete  CULTURE, BLOOD (ROUTINE X 2) w Reflex to ID Panel     Status: None (Preliminary result)   Collection Time: 02/16/17  9:00 PM  Result Value Ref Range Status   Specimen Description BLOOD LEFT ANTECUBITAL  Final   Special Requests BOTTLES DRAWN AEROBIC AND ANAEROBIC BCAV  Final   Culture NO GROWTH 2 DAYS  Final   Report Status PENDING  Incomplete   Creatinine:  Recent Labs  02/15/17 0343 02/16/17 0342  CREATININE 6.21* 6.37*    Impression/Assessment/plan:  1) BPH -  patient on a low dose of Cardura. I will add  tamsulosin. F/u outpt for DRE/PSA (pt with 1 cm LAD in pelvis).   2 urinary retention-will DC Foley at midnight. He is likely colonized due to the catheter so I'll cover him with one dose of cephalexin. He is on Vanc for C. Difficile.  Coby Shrewsberry 02/18/2017, 1:42 PM

## 2017-02-18 NOTE — Progress Notes (Signed)
[facility]  ROUNDING NOTE   Subjective:   No new BMP tpday Patient clinically appears better States he has not had any more loose stools Able to eat without nausea or vomiting Has been evaluated by urologist  Objective:  Vital signs in last 24 hours:  Temp:  [98 F (36.7 C)-98.7 F (37.1 C)] 98 F (36.7 C) (03/06 1139) Pulse Rate:  [66-73] 66 (03/06 1139) Resp:  [17-18] 17 (03/06 1139) BP: (106-145)/(34-56) 138/46 (03/06 1139) SpO2:  [97 %-98 %] 98 % (03/06 1139) Weight:  [79.5 kg (175 lb 3.2 oz)] 79.5 kg (175 lb 3.2 oz) (03/05 1710)  Weight change:  Filed Weights   02/15/17 0312 02/17/17 1710  Weight: 86.2 kg (190 lb) 79.5 kg (175 lb 3.2 oz)    Intake/Output: I/O last 3 completed shifts: In: -  Out: 550 [Urine:550]   Intake/Output this shift:  Total I/O In: -  Out: 75 [Urine:75]  Physical Exam: General: NAD, laying in bed  Head: Normocephalic, atraumatic. Moist oral mucosal membranes  Eyes: Anicteric,  Neck: Supple, trachea midline  Lungs:  Clear to auscultation  Heart: Regular rate and rhythm  Abdomen:  Soft, tender to palpation  Extremities:  1+ peripheral edema.  Neurologic: Nonfocal, moving all four extremities  Skin: No lesions  Access: none    Basic Metabolic Panel:  Recent Labs Lab 02/15/17 0343 02/15/17 1426 02/16/17 0342  NA 136  --  140  K 4.1  --  4.4  CL 110  --  115*  CO2 17*  --  19*  GLUCOSE 148*  --  111*  BUN 62*  --  58*  CREATININE 6.21*  --  6.37*  CALCIUM 8.5*  --  8.3*  PHOS  --  4.0  --     Liver Function Tests:  Recent Labs Lab 02/15/17 0343  AST 19  ALT 12*  ALKPHOS 89  BILITOT 0.7  PROT 6.4*  ALBUMIN 2.7*    Recent Labs Lab 02/15/17 0343 02/16/17 0342  LIPASE 178* 62*   No results for input(s): AMMONIA in the last 168 hours.  CBC:  Recent Labs Lab 02/15/17 0343 02/16/17 0342 02/17/17 1218  WBC 11.7* 17.1* 12.3*  HGB 8.5* 8.2* 7.6*  HCT 25.9* 25.4* 23.3*  MCV 94.4 95.4 93.9   PLT 346 355 330    Cardiac Enzymes: No results for input(s): CKTOTAL, CKMB, CKMBINDEX, TROPONINI in the last 168 hours.  BNP: Invalid input(s): POCBNP  CBG:  Recent Labs Lab 02/17/17 1135 02/17/17 1640 02/17/17 2136 02/18/17 0738 02/18/17 1137  GLUCAP 140* 100* 139* 92 142*    Microbiology: Results for orders placed or performed during the hospital encounter of 02/15/17  Gastrointestinal Panel by PCR , Stool     Status: None   Collection Time: 02/15/17  4:15 AM  Result Value Ref Range Status   Campylobacter species NOT DETECTED NOT DETECTED Final   Plesimonas shigelloides NOT DETECTED NOT DETECTED Final   Salmonella species NOT DETECTED NOT DETECTED Final   Yersinia enterocolitica NOT DETECTED NOT DETECTED Final   Vibrio species NOT DETECTED NOT DETECTED Final   Vibrio cholerae NOT DETECTED NOT DETECTED Final   Enteroaggregative E coli (EAEC) NOT DETECTED NOT DETECTED Final   Enteropathogenic E coli (EPEC) NOT DETECTED NOT DETECTED Final   Enterotoxigenic E coli (ETEC) NOT DETECTED NOT DETECTED Final   Shiga like toxin producing E coli (STEC) NOT DETECTED NOT DETECTED Final   Shigella/Enteroinvasive E coli (EIEC) NOT DETECTED NOT DETECTED Final  Cryptosporidium NOT DETECTED NOT DETECTED Final   Cyclospora cayetanensis NOT DETECTED NOT DETECTED Final   Entamoeba histolytica NOT DETECTED NOT DETECTED Final   Giardia lamblia NOT DETECTED NOT DETECTED Final   Adenovirus F40/41 NOT DETECTED NOT DETECTED Final   Astrovirus NOT DETECTED NOT DETECTED Final   Norovirus GI/GII NOT DETECTED NOT DETECTED Final   Rotavirus A NOT DETECTED NOT DETECTED Final   Sapovirus (I, II, IV, and V) NOT DETECTED NOT DETECTED Final  C difficile quick scan w PCR reflex     Status: Abnormal   Collection Time: 02/15/17  4:15 AM  Result Value Ref Range Status   C Diff antigen POSITIVE (A) NEGATIVE Final   C Diff toxin POSITIVE (A) NEGATIVE Final   C Diff interpretation Toxin producing C.  difficile detected.  Final    Comment: CRITICAL RESULT CALLED TO, READ BACK BY AND VERIFIED WITH: [facility] YUAL AT 2542 02/15/17.PMH   CULTURE, BLOOD (ROUTINE X 2) w Reflex to ID Panel     Status: None (Preliminary result)   Collection Time: 02/16/17  8:57 PM  Result Value Ref Range Status   Specimen Description BLOOD RIGHT ARM  Final   Special Requests BOTTLES DRAWN AEROBIC AND ANAEROBIC BCAV  Final   Culture NO GROWTH 2 DAYS  Final   Report Status PENDING  Incomplete  CULTURE, BLOOD (ROUTINE X 2) w Reflex to ID Panel     Status: None (Preliminary result)   Collection Time: 02/16/17  9:00 PM  Result Value Ref Range Status   Specimen Description BLOOD LEFT ANTECUBITAL  Final   Special Requests BOTTLES DRAWN AEROBIC AND ANAEROBIC BCAV  Final   Culture NO GROWTH 2 DAYS  Final   Report Status PENDING  Incomplete    Coagulation Studies: No results for input(s): LABPROT, INR in the last 72 hours.  Urinalysis:  Recent Labs  02/17/17 1711  COLORURINE YELLOW*  LABSPEC 1.014  PHURINE 5.0  GLUCOSEU 50*  HGBUR NEGATIVE  BILIRUBINUR NEGATIVE  KETONESUR NEGATIVE  PROTEINUR >=300*  NITRITE NEGATIVE  LEUKOCYTESUR TRACE*      Imaging: Ct Chest Wo Contrast  Result Date: 02/18/2017 CLINICAL DATA:  Possible right upper lung nodule on recent chest radiograph. EXAM: CT CHEST WITHOUT CONTRAST TECHNIQUE: Multidetector CT imaging of the chest was performed following the standard protocol without IV contrast. COMPARISON:  02/17/2017 chest radiograph. FINDINGS: Cardiovascular: Top-normal heart size. No significant pericardial fluid/thickening. Left main, left anterior descending, left circumflex and right coronary atherosclerosis. Atherosclerotic nonaneurysmal thoracic aorta. Normal caliber pulmonary arteries. Mediastinum/Nodes: No discrete thyroid nodules. Unremarkable esophagus. No pathologically enlarged axillary, mediastinal or gross hilar lymph nodes, noting limited sensitivity for the detection of  hilar adenopathy on this noncontrast study. Lungs/Pleura: No pneumothorax. No pleural effusion. Peripheral right upper lobe 3 mm solid pulmonary nodule (series 3/ image 57). Patchy subpleural reticulation, ground-glass attenuation and subpleural lines in both lungs with a posterior basilar predominance. Stable parenchymal bands in the left lower lobe and dependent inferior segment lingula, compatible postinfectious/postinflammatory scarring. No significant regions of traction bronchiectasis or frank honeycombing. No acute consolidative airspace disease, lung masses or additional significant pulmonary nodules. Upper abdomen: Irregular liver surface with relative hypertrophy of the lateral segment left liver lobe, suggesting cirrhosis. Musculoskeletal: No aggressive appearing focal osseous lesions. Mild thoracic spondylosis. IMPRESSION: 1. Nodular opacity described on the 02/17/2017 chest radiograph report in the right upper lung region correlates with normal calcification at the right first costochondral junction. 2. Solitary 3 mm solid peripheral right upper lobe  pulmonary nodule (which is occult on chest radiograph). No follow-up needed if patient is low-risk. Non-contrast chest CT can be considered in 12 months if patient is high-risk. This recommendation follows the consensus statement: Guidelines for Management of Incidental Pulmonary Nodules Detected on CT Images: From the Fleischner Society 2017; Radiology 2017; 284:228-243. 3. Patchy subpleural reticulation, ground-glass attenuation and subpleural lines in both lungs with a posterior basilar predominance. Findings could represent an interstitial lung disease such as nonspecific interstitial pneumonia (NSIP) or usual interstitial pneumonia (UIP). Consider a follow-up high-resolution chest CT in 12 months to assess temporal pattern stability, as clinically warranted. 4. Morphologic changes suggestive of cirrhosis. Consider hepatic elastography for further liver  fibrosis risk stratification, as clinically warranted. 5. Aortic atherosclerosis. Left main and 3 vessel coronary atherosclerosis. Electronically Signed   By: Ilona Sorrel M.D.   On: 02/18/2017 09:28   Dg Chest Port 1 View  Result Date: 02/17/2017 CLINICAL DATA:  Fever EXAM: PORTABLE CHEST 1 VIEW COMPARISON:  01/30/2017. FINDINGS: 0938 hours. The cardio pericardial silhouette is enlarged. Interstitial markings are diffusely coarsened with chronic features. 1 cm nodular density identified adjacent to the anterior right first rib and. This was not evident on prior studies from 09/01/2016 or 02/10/2015. Subsegmental atelectasis noted left lower lung. The visualized bony structures of the thorax are intact. IMPRESSION: 1 cm right upper lobe pulmonary nodule. CT chest recommended to further evaluate. These results will be called to the ordering clinician or representative by the Radiologist Assistant, and communication documented in the PACS or zVision Dashboard. Electronically Signed   By: Misty Stanley M.D.   On: 02/17/2017 10:13     Medications:    . amLODipine  2.5 mg Oral Daily  . atorvastatin  20 mg Oral q1800  . [START ON 02/19/2017] cephALEXin  500 mg Oral Once  . doxazosin  2 mg Oral QHS  . feeding supplement (GLUCERNA SHAKE)  237 mL Oral TID BM  . ferrous sulfate  325 mg Oral Q breakfast  . heparin  5,000 Units Subcutaneous Q8H  . hydrALAZINE  50 mg Oral TID  . insulin aspart  0-5 Units Subcutaneous QHS  . insulin aspart  0-9 Units Subcutaneous TID WC  . metoprolol succinate  25 mg Oral Daily  . sodium bicarbonate  650 mg Oral TID  . tamsulosin  0.4 mg Oral QPC supper  . vancomycin  125 mg Oral Q6H   gabapentin, hydrALAZINE, morphine injection, ondansetron **OR** ondansetron (ZOFRAN) IV, oxyCODONE  Assessment/ Plan:  Mr. Aviv Lengacher is a 76 y.o. Hispanic male Spanish speaking with diabetes mellitus type II, hypertension, hepatic cirrhosis, BPH, gout, diabetic neuropathy who  was admitted to [facility] on 02/15/2017 for C. difficile colitis   1. Acute renal failure on chronic kidney disease stage V: with proteinuria, metabolic acidosis and hypoalbuminemia.  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 Baseline creatinine of 6.23, GFR of 8 on 02/11/17. Followed by Dr. Juanito Doom, [facility].  - S Creatinine remains critically elevated - Renally dose all medications - Discuss possibility of dialysis with patient but he wants to discuss this with outpatient physicians first  2. Hypertension: blood pressure mildly elevated. With peripheral edema on examination.  Home regimen of amlodipine, furosemide, hydralazine, and metoprolol. Not on ACE-I/ARB due to advanced renal disease.   3. Diabetes mellitus type II with chronic kidney disease: insulin dependent - Hemoglobin A1c.  5.1 %  4. Anemia of chronic kidney disease: gets outpatient Aranesp. Hemoglobin 7.6   5.  RUL lung nodule - Pulmonary evaluation ongoing. CT chest neg for mailgnancy  6. VANC orally for C Diff    LOS: 3 Caelyn Route 3/6/20183:57 PM

## 2017-02-18 NOTE — Progress Notes (Signed)
[facility] [place] NAME: Martin Gilbert    MR#:  [id number]  DATE OF BIRTH:  May 07, 1941  SUBJECTIVE.feeling better today.no diarrhoea,mild abdominal pain.  Ct chest negative for  Malignancy.spoke to urology about foley cath and possible removal.  CHIEF COMPLAINT:   Chief Complaint  Patient presents with  . Diarrhea  . Abdominal Pain    REVIEW OF SYSTEMS:   ROS CONSTITUTIONAL: has, fatigue or weakness.  EYES: No blurred or double vision.  EARS, NOSE, AND THROAT: No tinnitus or ear pain.  RESPIRATORY: No cough, shortness of breath, wheezing or hemoptysis.  CARDIOVASCULAR: No chest pain, orthopnea, edema.  GASTROINTESTINAL: diarrhoea resolved  GENITOURINARY:has bladder spasms..  ENDOCRINE: No polyuria, nocturia,  HEMATOLOGY: No anemia, easy bruising or bleeding SKIN: No rash or lesion. MUSCULOSKELETAL: No joint pain or arthritis.   NEUROLOGIC: No tingling, numbness, weakness.  PSYCHIATRY: No anxiety or depression.   DRUG ALLERGIES:   Allergies  Allergen Reactions  . Acetaminophen Hives    Possible lip swelling.  Patient is not sure.  09/01/16- patient denies allergy  . Aspirin Rash    09/01/16- patient denies allergy    VITALS:  Blood pressure (!) 138/46, pulse 66, temperature 98 F (36.7 C), temperature source Oral, resp. rate 17, height 5\' 7"  (1.702 m), weight 79.5 kg (175 lb 3.2 oz), SpO2 98 %.  PHYSICAL EXAMINATION:  GENERAL:  76 y.o.-year-old patient lying in the bed with no acute distress.  EYES: Pupils equal, round, reactive to light and accommodation. No scleral icterus. Extraocular muscles intact.  HEENT: Head atraumatic, normocephalic. Oropharynx and nasopharynx clear.  NECK:  Supple, no jugular venous distention. No thyroid enlargement, no tenderness.  LUNGS: Normal breath sounds bilaterally, no wheezing, rales,rhonchi or crepitation. No use of accessory muscles of respiration.  CARDIOVASCULAR: S1,  S2 normal. No murmurs, rubs, or gallops.  ABDOMEN: Soft, slight left lower quadrant tenderness . Bowel sounds present. No organomegaly or mass.  EXTREMITIES: No pedal edema, cyanosis, or clubbing.  NEUROLOGIC: Cranial nerves II through XII are intact. Muscle strength 5/5 in all extremities. Sensation intact. Gait not checked.  PSYCHIATRIC: The patient is alert and oriented x 3.  SKIN: No obvious rash, lesion, or ulcer.    LABORATORY PANEL:   CBC  Recent Labs Lab 02/17/17 1218  WBC 12.3*  HGB 7.6*  HCT 23.3*  PLT 330   ------------------------------------------------------------------------------------------------------------------  Chemistries   Recent Labs Lab 02/15/17 0343 02/16/17 0342  NA 136 140  K 4.1 4.4  CL 110 115*  CO2 17* 19*  GLUCOSE 148* 111*  BUN 62* 58*  CREATININE 6.21* 6.37*  CALCIUM 8.5* 8.3*  AST 19  --   ALT 12*  --   ALKPHOS 89  --   BILITOT 0.7  --    ------------------------------------------------------------------------------------------------------------------  Cardiac Enzymes No results for input(s): TROPONINI in the last 168 hours. ------------------------------------------------------------------------------------------------------------------  RADIOLOGY:  Ct Chest Wo Contrast  Result Date: 02/18/2017 CLINICAL DATA:  Possible right upper lung nodule on recent chest radiograph. EXAM: CT CHEST WITHOUT CONTRAST TECHNIQUE: Multidetector CT imaging of the chest was performed following the standard protocol without IV contrast. COMPARISON:  02/17/2017 chest radiograph. FINDINGS: Cardiovascular: Top-normal heart size. No significant pericardial fluid/thickening. Left main, left anterior descending, left circumflex and right coronary atherosclerosis. Atherosclerotic nonaneurysmal thoracic aorta. Normal caliber pulmonary arteries. Mediastinum/Nodes: No discrete thyroid nodules. Unremarkable esophagus. No pathologically enlarged axillary,  mediastinal or gross hilar lymph nodes, noting limited sensitivity for the detection of  hilar adenopathy on this noncontrast study. Lungs/Pleura: No pneumothorax. No pleural effusion. Peripheral right upper lobe 3 mm solid pulmonary nodule (series 3/ image 57). Patchy subpleural reticulation, ground-glass attenuation and subpleural lines in both lungs with a posterior basilar predominance. Stable parenchymal bands in the left lower lobe and dependent inferior segment lingula, compatible postinfectious/postinflammatory scarring. No significant regions of traction bronchiectasis or frank honeycombing. No acute consolidative airspace disease, lung masses or additional significant pulmonary nodules. Upper abdomen: Irregular liver surface with relative hypertrophy of the lateral segment left liver lobe, suggesting cirrhosis. Musculoskeletal: No aggressive appearing focal osseous lesions. Mild thoracic spondylosis. IMPRESSION: 1. Nodular opacity described on the 02/17/2017 chest radiograph report in the right upper lung region correlates with normal calcification at the right first costochondral junction. 2. Solitary 3 mm solid peripheral right upper lobe pulmonary nodule (which is occult on chest radiograph). No follow-up needed if patient is low-risk. Non-contrast chest CT can be considered in 12 months if patient is high-risk. This recommendation follows the consensus statement: Guidelines for Management of Incidental Pulmonary Nodules Detected on CT Images: From the Fleischner Society 2017; Radiology 2017; 284:228-243. 3. Patchy subpleural reticulation, ground-glass attenuation and subpleural lines in both lungs with a posterior basilar predominance. Findings could represent an interstitial lung disease such as nonspecific interstitial pneumonia (NSIP) or usual interstitial pneumonia (UIP). Consider a follow-up high-resolution chest CT in 12 months to assess temporal pattern stability, as clinically warranted. 4.  Morphologic changes suggestive of cirrhosis. Consider hepatic elastography for further liver fibrosis risk stratification, as clinically warranted. 5. Aortic atherosclerosis. Left main and 3 vessel coronary atherosclerosis. Electronically Signed   By: Ilona Sorrel M.D.   On: 02/18/2017 09:28   Dg Chest Port 1 View  Result Date: 02/17/2017 CLINICAL DATA:  Fever EXAM: PORTABLE CHEST 1 VIEW COMPARISON:  01/30/2017. FINDINGS: 0938 hours. The cardio pericardial silhouette is enlarged. Interstitial markings are diffusely coarsened with chronic features. 1 cm nodular density identified adjacent to the anterior right first rib and. This was not evident on prior studies from 09/01/2016 or 02/10/2015. Subsegmental atelectasis noted left lower lung. The visualized bony structures of the thorax are intact. IMPRESSION: 1 cm right upper lobe pulmonary nodule. CT chest recommended to further evaluate. These results will be called to the ordering clinician or representative by the Radiologist Assistant, and communication documented in the PACS or zVision Dashboard. Electronically Signed   By: Misty Stanley M.D.   On: 02/17/2017 10:13    EKG:   Orders placed or performed during the hospital encounter of 01/30/17  . EKG 12-Lead  . EKG 12-Lead    ASSESSMENT AND PLAN:  [age] yr [age] male with cdiff.colitis;improving with po vanco.no diarrhea today.he feels better.   2.ckd stage 5;no  Acute indication for HD..check labs for am, 3.htn;  4.h/o BPH and urine  Retention;;foley discontinued by urology,continue flomax and Cardura;bladder training today.   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 PT consult,likley d/c am D/w pt and wife thru [place] interpreter. All the records are reviewed and case discussed with Care Management/Social Workerr. Management plans discussed with  the patient, family and they are in agreement.  CODE STATUS: full  TOTAL TIME TAKING CARE OF THIS PATIENT: 35 minutes.   POSSIBLE D/C IN 1-2DAYS, DEPENDING ON CLINICAL CONDITION.   Epifanio Lesches M.D on 02/18/2017 at 8:00 PM  Between 7am to 6pm - Pager - [phone]  After 6pm go to  [email or web address] - password [email or web address] [facility]  Office  [phone]  CC: Primary care physician; [name], MD   Note: This dictation was prepared with Dragon dictation along with smaller phrase technology. Any transcriptional errors that result from this process are unintentional.

## 2017-02-19 LAB — CBC
HEMATOCRIT: 22.8 % — AB (ref 40.0–52.0)
HEMOGLOBIN: 7.5 g/dL — AB (ref 13.0–18.0)
MCH: 31.2 pg (ref 26.0–34.0)
MCHC: 33 g/dL (ref 32.0–36.0)
MCV: 94.6 fL (ref 80.0–100.0)
Platelets: 340 10*3/uL (ref 150–440)
RBC: 2.41 MIL/uL — ABNORMAL LOW (ref 4.40–5.90)
RDW: 15.8 % — AB (ref 11.5–14.5)
WBC: 8 10*3/uL (ref 3.8–10.6)

## 2017-02-19 LAB — BASIC METABOLIC PANEL
ANION GAP: 7 (ref 5–15)
BUN: 52 mg/dL — ABNORMAL HIGH (ref 6–20)
CALCIUM: 7.9 mg/dL — AB (ref 8.9–10.3)
CHLORIDE: 116 mmol/L — AB (ref 101–111)
CO2: 18 mmol/L — ABNORMAL LOW (ref 22–32)
CREATININE: 7.49 mg/dL — AB (ref 0.61–1.24)
GFR calc non Af Amer: 6 mL/min — ABNORMAL LOW (ref >60)
GFR, EST AFRICAN AMERICAN: 7 mL/min — AB (ref >60)
Glucose, Bld: 103 mg/dL — ABNORMAL HIGH (ref 65–99)
Potassium: 3.9 mmol/L (ref 3.5–5.1)
SODIUM: 141 mmol/L (ref 135–145)

## 2017-02-19 LAB — GLUCOSE, CAPILLARY
GLUCOSE-CAPILLARY: 102 mg/dL — AB (ref 65–99)
GLUCOSE-CAPILLARY: 168 mg/dL — AB (ref 65–99)

## 2017-02-19 MED ORDER — VANCOMYCIN 50 MG/ML ORAL SOLUTION: 125.0000 mg | Freq: Four times a day (QID) | ORAL | 0 refills | Status: DC

## 2017-02-19 MED ORDER — TAMSULOSIN HCL 0.4 MG PO CAPS: 0.4000 mg | ORAL_CAPSULE | Freq: Every day | ORAL | 0 refills | Status: DC

## 2017-02-19 NOTE — Progress Notes (Signed)
Interpreter utilized for medication pass and update of patient care. Daughter updated as well.

## 2017-02-19 NOTE — Progress Notes (Signed)
Foley removed per order. Patient tolerated well . Leg bag was empty upon removal. Martin Gilbert

## 2017-02-19 NOTE — Care Management (Signed)
Patient to discharge home today. Spoke with daughter [name].  Angie declines any home health services at discharge.  Patient lives at home with spouse, adult children coming and check on them daily.  No additional needs identified.

## 2017-02-19 NOTE — Discharge Summary (Signed)
Martin Gilbert, is a 76 y.o. male  DOB [birth year]  MRN [id number].  Admission date:  02/15/2017  Admitting Physician  Saundra Shelling, MD  Discharge Date:  02/19/2017   Primary MD  [name], MD  Recommendations for primary care physician for things to follow:   All up with primary doctor in 1 week Follow up with [facility] nephrology in 1 week  Admission Diagnosis  C. difficile colitis [A04.72] Abdominal pain, unspecified abdominal location [R10.9] Acute pancreatitis, unspecified complication status, unspecified pancreatitis type [K85.90]   Discharge Diagnosis  C. difficile colitis [A04.72] Abdominal pain, unspecified abdominal location [R10.9] Acute pancreatitis, unspecified complication status, unspecified pancreatitis type [K85.90]    Active Problems:   C. difficile colitis   Protein-calorie malnutrition, severe      Past Medical History:  Diagnosis Date  . Back pain   . BPH (benign prostatic hyperplasia)   . CKD (chronic kidney disease), stage IV ([place])   . Diabetes mellitus without complication ([place])   . HTN (hypertension)   . Hyperlipidemia     Past Surgical History:  Procedure Laterality Date  . hernia repair         History of present illness and  Hospital Course:     Kindly see H&P for history of present illness and admission details, please review complete Labs, Consult reports and Test reports for all details in brief  HPI  from the history and physical done on the day of admission 76 year old Spanish-speaking male with history of diabetes mellitus type 2, hypertension, liver cirrhosis, gout, BPH admitted because of diarrhea and found to have C. difficile colitis.   Hospital Course  1. C. difficile colitis spell. It initially started on Flagyl but patient continued to have diarrhea so   and started on by mouth vancomycin on Sunday he will be discharged with vancomycin 125 mg 4 times daily for 12 days. Stop date for antibiotic is March 18.  #2 acute on chronic renal failure with CK disease stage V: Patient follows up [facility]. Baseline creatinine 6.23. Patient creatinine varied between 6.2-6.3 but today it is 7.49. But he has no shortness of breath, potassium is 3.9.  I will discuss with nephrology,before discharging.  Pt  told me that he takes herbal supplements from [place] and he thinks that his CK D is not getting worse, he was told that he needs dialysis 2 years ago but he still not started on dialysis and he is convinced that herbal supplements are helping him to stabilize and prevent further worsening of renal function. Used Spanish interpreter throughout his hospital stay.  3. Diabetes mellitus type 2: Continue his glipizide 5 mg by mouth daily.  #4 right upper nodule seen on the chest x-ray, pulmonary Dr. Raul Del has followed the patient, CT chest without contrast ordered, patient CT chest without contrast did not show any malignancy, it did show opacification of the ribs. The same explained to the patient.  #5 hepatic cirrhosis without any complaints.  #6, bilateral pelvic sidewall nodes: Slightly more prominent than 2017. Patient needs to follow-up with primary doctor for repeat abdominal CAT scan. 7 mild coronary calcifications: Patient sees cardiologist in next few days.  #8 urine retention, seen by urology, patient had a Foley from before, Foley discontinued, . Voiding well. Patient is on Flomax, Cardura, continue, follow up with urology as an outpatient.  #9. severe malnutrition in the context of acute illness: Seen by dietary, continue Glucerna. The patient by mouth intake is  better.  Discharge Condition: stable   Follow UP  Follow-up Information    Bender, Durene Cal, MD Follow up in 1 week(s).   Specialty:  Family Medicine Contact  information: [place] [address] [phone]        [facility] nephrology Follow up in 1 week(s).             Discharge Instructions  and  Discharge Medications      Allergies as of 02/19/2017      Reactions   Acetaminophen Hives   Possible lip swelling.  Patient is not sure.  09/01/16- patient denies allergy   Aspirin Rash   09/01/16- patient denies allergy      Medication List    STOP taking these medications   furosemide 40 MG tablet Commonly known as:  LASIX   oxyCODONE 5 MG immediate release tablet Commonly known as:  ROXICODONE   senna-docusate 8.6-50 MG tablet Commonly known as:  Senokot-S     TAKE these medications   acetaminophen 500 MG tablet Commonly known as:  TYLENOL Take 500 mg by mouth every 8 (eight) hours as needed.   allopurinol 100 MG tablet Commonly known as:  ZYLOPRIM Take 100 mg by mouth daily.   amLODipine 2.5 MG tablet Commonly known as:  NORVASC Take 2.5 mg by mouth daily.   aspirin 81 MG tablet Take 81 mg by mouth daily.   atorvastatin 20 MG tablet Commonly known as:  LIPITOR Take 20 mg by mouth daily.   baclofen 10 MG tablet Commonly known as:  LIORESAL Take 1 tablet (10 mg total) by mouth 3 (three) times daily.   carbamide peroxide 6.5 % otic solution Commonly known as:  DEBROX Place 5 drops into the right ear 2 (two) times daily.   doxazosin 2 MG tablet Commonly known as:  CARDURA Take 2 mg by mouth at bedtime.   feeding supplement (GLUCERNA SHAKE) Liqd Take 237 mLs by mouth 3 (three) times daily between meals.   ferrous sulfate 325 (65 FE) MG tablet Take 325 mg by mouth daily with breakfast.   gabapentin 100 MG capsule Commonly known as:  NEURONTIN Take 1 capsule (100 mg total) by mouth every 8 (eight) hours as needed (tingling or numbness in feet).   glipiZIDE 5 MG tablet Commonly known as:  GLUCOTROL Take 5 mg by mouth daily.   hydrALAZINE 50 MG tablet Commonly known as:   APRESOLINE Take 50 mg by mouth 3 (three) times daily.   metoprolol succinate 25 MG 24 hr tablet Commonly known as:  TOPROL-XL Take 25 mg by mouth daily.   MULTI-VITAMINS Tabs Take 1 tablet by mouth daily.   ondansetron 4 MG tablet Commonly known as:  ZOFRAN Take 1 tablet (4 mg total) by mouth every 6 (six) hours as needed for nausea.   pantoprazole 40 MG tablet Commonly known as:  PROTONIX Take 1 tablet (40 mg total) by mouth daily.   REFRESH OP Place 1 drop into both eyes 2 (two) times daily.   sodium bicarbonate 650 MG tablet Take 650 mg by mouth 2 (two) times daily.   tamsulosin 0.4 MG Caps capsule Commonly known as:  FLOMAX Take 1 capsule (0.4 mg total) by mouth daily after supper.   vancomycin 50 mg/mL oral solution Commonly known as:  VANCOCIN Take 2.5 mLs (125 mg total) by mouth every 6 (six) hours.   Vitamin D (Cholecalciferol) 1000 units Tabs Take by mouth.  Diet and Activity recommendation: See Discharge Instructions above   Consults obtained - nephrology,pulmonology   Major procedures and Radiology Reports - PLEASE review detailed and final reports for all details, in brief -     Ct Abdomen Pelvis Wo Contrast  Result Date: 02/15/2017 CLINICAL DATA:  Acute onset of generalized abdominal pain and diarrhea. Recent placement of catheter for urinary retention. Initial encounter. EXAM: CT ABDOMEN AND PELVIS WITHOUT CONTRAST TECHNIQUE: Multidetector CT imaging of the abdomen and pelvis was performed following the standard protocol without IV contrast. COMPARISON:  CT of the abdomen and pelvis performed 02/12/2016, MRI of the lumbar spine performed 02/01/2017, and renal ultrasound performed 01/31/2017 FINDINGS: Lower chest: Mild coronary artery calcifications are seen. Minimal bibasilar atelectasis is noted. A small hiatal hernia is noted. Hepatobiliary: The nodular contour of the liver is compatible with hepatic cirrhosis. Minimal soft tissue inflammation  is noted about the gallbladder. The gallbladder is otherwise grossly unremarkable. The common bile duct remains normal in caliber. Pancreas: The pancreas is within normal limits. Spleen: The spleen is unremarkable in appearance. Adrenals/Urinary Tract: The adrenal glands are grossly unremarkable in appearance. Mild bilateral renal atrophy is noted, with nonspecific perinephric stranding noted bilaterally. There is no evidence of hydronephrosis. No renal or ureteral stones are identified. Stomach/Bowel: The stomach is unremarkable in appearance. The small bowel is within normal limits. The appendix is normal in caliber, without evidence of appendicitis. Mild soft tissue inflammation is suggested about the cecum, and trace soft tissue inflammation is seen along the paracolic gutters bilaterally. There is question of additional soft tissue inflammation about the sigmoid colon. This raises concern for a mild infectious or inflammatory process. Vascular/Lymphatic: The abdominal aorta is unremarkable in appearance. The inferior vena cava is grossly unremarkable. No retroperitoneal lymphadenopathy is seen. Scattered mildly prominent bilateral pelvic sidewall nodes are seen, measuring up to 1.0 cm in size. Reproductive: Mild soft tissue inflammation about the bladder is nonspecific. The prostate is enlarged, measuring up to 5.8 cm in transverse dimension, with impression on the base of the bladder. The patient's Foley catheter is noted ending at the bladder. Other: No additional soft tissue abnormalities are seen. Musculoskeletal: No acute osseous abnormalities are identified. The visualized musculature is unremarkable in appearance. IMPRESSION: 1. Mild soft tissue inflammation suggested about the cecum, and trace soft tissue inflammation along the paracolic gutters bilaterally. Question of additional soft tissue inflammation about the sigmoid colon. This raises concern for a mild infectious or inflammatory process. 2.  Minimal soft tissue inflammation about the gallbladder. The gallbladder is otherwise grossly unremarkable. Would correlate for any evidence of very mild cholecystitis. 3. Enlarged prostate, with impression on the base of bladder. Would correlate with PSA. 4. Mildly prominent bilateral pelvic sidewall nodes, measuring up to 1.0 cm, slightly more prominent than in 2017. Metastatic disease cannot be excluded. 5. Findings of hepatic cirrhosis. 6. Mild coronary artery calcifications seen. 7. Small hiatal hernia noted. 8. Mild bowel renal atrophy. Electronically Signed   By: Garald Balding M.D.   On: 02/15/2017 06:52   Dg Chest 2 View  Result Date: 01/30/2017 CLINICAL DATA:  Fever, weakness, and pain in both legs since the morning. [place] getting out of bed today. EXAM: CHEST  2 VIEW COMPARISON:  09/01/2016 FINDINGS: Shallow inspiration. Slight linear atelectasis in the left mid lung. No focal airspace disease or consolidation. No blunting of costophrenic angles. No pneumothorax. Mediastinal contours appear intact. Normal heart size and pulmonary vascularity. Degenerative changes in the spine. IMPRESSION: Shallow inspiration with  slight linear atelectasis in the left mid lung. No evidence of active pulmonary disease. Electronically Signed   By: Lucienne Capers M.D.   On: 01/30/2017 21:40   Dg Ribs Unilateral Left  Result Date: 02/01/2017 CLINICAL DATA:  Patient with left-sided chest pain. EXAM: LEFT RIBS - 2 VIEW COMPARISON:  Chest radiograph 01/30/2017. FINDINGS: No evidence for displaced left-sided rib fracture. Elevation left hemidiaphragm. No large area of pulmonary consolidation. Stable nodular opacity left lung apex, similar in appearance to chest radiograph 05/31/2009. IMPRESSION: No acute osseous abnormality. No evidence for left-sided displaced rib fracture. Electronically Signed   By: Lovey Newcomer M.D.   On: 02/01/2017 13:53   Dg Lumbar Spine 2-3 Views  Result Date: 01/31/2017 CLINICAL DATA:  Lower  back pain without known injury. EXAM: LUMBAR SPINE - 2-3 VIEW COMPARISON:  None. FINDINGS: No fracture or spondylolisthesis is noted. Mild degenerative disc disease is noted at T12-L1 and L1-2 with anterior osteophyte formation. Remaining disc spaces appear intact. IMPRESSION: Mild degenerative disc disease as described above. No acute abnormality seen in the lumbar spine. Electronically Signed   By: Marijo Conception, M.D.   On: 01/31/2017 10:14   Dg Pelvis 1-2 Views  Result Date: 01/31/2017 CLINICAL DATA:  Pelvic pain without known injury. EXAM: PELVIS - 1-2 VIEW COMPARISON:  None. FINDINGS: There is no evidence of pelvic fracture or diastasis. No pelvic bone lesions are seen. IMPRESSION: Normal pelvis. Electronically Signed   By: Marijo Conception, M.D.   On: 01/31/2017 10:12   Ct Chest Wo Contrast  Result Date: 02/18/2017 CLINICAL DATA:  Possible right upper lung nodule on recent chest radiograph. EXAM: CT CHEST WITHOUT CONTRAST TECHNIQUE: Multidetector CT imaging of the chest was performed following the standard protocol without IV contrast. COMPARISON:  02/17/2017 chest radiograph. FINDINGS: Cardiovascular: Top-normal heart size. No significant pericardial fluid/thickening. Left main, left anterior descending, left circumflex and right coronary atherosclerosis. Atherosclerotic nonaneurysmal thoracic aorta. Normal caliber pulmonary arteries. Mediastinum/Nodes: No discrete thyroid nodules. Unremarkable esophagus. No pathologically enlarged axillary, mediastinal or gross hilar lymph nodes, noting limited sensitivity for the detection of hilar adenopathy on this noncontrast study. Lungs/Pleura: No pneumothorax. No pleural effusion. Peripheral right upper lobe 3 mm solid pulmonary nodule (series 3/ image 57). Patchy subpleural reticulation, ground-glass attenuation and subpleural lines in both lungs with a posterior basilar predominance. Stable parenchymal bands in the left lower lobe and dependent inferior  segment lingula, compatible postinfectious/postinflammatory scarring. No significant regions of traction bronchiectasis or frank honeycombing. No acute consolidative airspace disease, lung masses or additional significant pulmonary nodules. Upper abdomen: Irregular liver surface with relative hypertrophy of the lateral segment left liver lobe, suggesting cirrhosis. Musculoskeletal: No aggressive appearing focal osseous lesions. Mild thoracic spondylosis. IMPRESSION: 1. Nodular opacity described on the 02/17/2017 chest radiograph report in the right upper lung region correlates with normal calcification at the right first costochondral junction. 2. Solitary 3 mm solid peripheral right upper lobe pulmonary nodule (which is occult on chest radiograph). No follow-up needed if patient is low-risk. Non-contrast chest CT can be considered in 12 months if patient is high-risk. This recommendation follows the consensus statement: Guidelines for Management of Incidental Pulmonary Nodules Detected on CT Images: From the Fleischner Society 2017; Radiology 2017; 284:228-243. 3. Patchy subpleural reticulation, ground-glass attenuation and subpleural lines in both lungs with a posterior basilar predominance. Findings could represent an interstitial lung disease such as nonspecific interstitial pneumonia (NSIP) or usual interstitial pneumonia (UIP). Consider a follow-up high-resolution chest CT in 12 months to  assess temporal pattern stability, as clinically warranted. 4. Morphologic changes suggestive of cirrhosis. Consider hepatic elastography for further liver fibrosis risk stratification, as clinically warranted. 5. Aortic atherosclerosis. Left main and 3 vessel coronary atherosclerosis. Electronically Signed   By: Ilona Sorrel M.D.   On: 02/18/2017 09:28   Mr Lumbar Spine Wo Contrast  Result Date: 02/01/2017 CLINICAL DATA:  76 year old male. Stage 4 chronic kidney disease. Presented to the ED recently with fever and  confusion. 102.2 Fahrenheit. Foul smelling urine. Recent fall at home. Left side low back pain. Initial encounter. EXAM: MRI LUMBAR SPINE WITHOUT CONTRAST TECHNIQUE: Multiplanar, multisequence MR imaging of the lumbar spine was performed. No intravenous contrast was administered. COMPARISON:  Lumbar radiographs 01/31/2017. CT Abdomen and Pelvis 02/12/2016 FINDINGS: Segmentation: Normal as seen on the comparison CT Abdomen and Pelvis. Alignment: Stable and essentially normal vertebral height and alignment compared to 2017. Vertebrae: Visualized bone marrow signal is within normal limits. No marrow edema or evidence of acute osseous abnormality. Visible sacral ala and SI joints appear normal and intact. Conus medullaris: Extends to the T12-L1 level and appears normal. Paraspinal and other soft tissues: Confluent abnormal bilateral erector spinae muscle edema, mostly from the L3 level to the sacrum (series 5 images 9 on the right and images 14 and 15 on the left). No discrete intramuscular fluid or fluid collection. The psoas muscles are normal. The lumbar epidural space is normal. There is nonspecific perinephric stranding and trace fluid which on the right does abut the superior right psoas muscle. Partially visible chronic renal atrophy. No retroperitoneal lymphadenopathy. Disc levels: T11-T12:  Negative. T12-L1:  Negative. L1-L2: Mild circumferential disc bulge with small left lateral recess disc protrusion (series 6, image 5). No significant stenosis. L2-L3: Mostly far lateral disc bulging. Mild facet hypertrophy. No significant stenosis. L3-L4: Circumferential disc bulge with broad-based posterior component. Mild to moderate facet and left ligament flavum hypertrophy. No significant stenosis. L4-L5: Circumferential disc bulge with broad-based posterior component. Left paracentral annular fissure (series 6, image 25). Moderate facet and ligament flavum hypertrophy. Mild to moderate spinal and left greater than  right lateral recess stenosis. Mild bilateral L4 foraminal stenosis. L5-S1: Mild mostly far lateral disc bulging. Epidural lipomatosis which largely effaces CSF from the thecal sac at this level. No other stenosis. IMPRESSION: 1. Confluent edema in the bilateral lower lumbar and upper sacral erector spinae muscles, indicating nonspecific myositis. This could be posttraumatic or inflammatory. There is no associated muscle abscess. 2. No acute or inflammatory changes identified in the lumbar spine. 3. Lumbar spine degeneration resulting in multifactorial mild to moderate spinal and left greater than right lateral recess stenosis at L4-L5. Electronically Signed   By: Genevie Ann M.D.   On: 02/01/2017 15:14   US Renal  Result Date: 01/31/2017 CLINICAL DATA:  Chronic kidney disease. EXAM: RENAL / URINARY TRACT ULTRASOUND COMPLETE COMPARISON:  02/12/2016 CT abdomen/ pelvis. FINDINGS: Right Kidney: Length: 10.5 cm. Mildly echogenic right kidney with mild right renal parenchymal atrophy. No right hydronephrosis. No right renal mass. Left Kidney: Length: 11.2 cm. Mildly echogenic left kidney with mild left renal parenchymal atrophy. No left hydronephrosis. Simple appearing 0.8 x 0.8 x 1.0 cm partially exophytic renal cyst in the lower left kidney. Bladder: Appears normal for degree of bladder distention. Mass-effect on the bladder base by the enlarged prostate, which measures approximately 5.4 x 4.5 x 4.2 cm. IMPRESSION: 1. No hydronephrosis. 2. Mildly echogenic and mildly atrophic kidneys bilaterally, compatible with the provided history of chronic kidney disease.  3. Tiny benign-appearing lower left renal cyst. 4. Normal bladder. 5. Enlarged prostate. Electronically Signed   By: Ilona Sorrel M.D.   On: 01/31/2017 10:02   US Venous Img Lower Unilateral Left  Result Date: 02/01/2017 CLINICAL DATA:  LEFT leg swelling for 6 months EXAM: LEFT LOWER EXTREMITY VENOUS DOPPLER ULTRASOUND TECHNIQUE: Gray-scale sonography with  graded compression, as well as color Doppler and duplex ultrasound were performed to evaluate the lower extremity deep venous systems from the level of the common femoral vein and including the common femoral, femoral, profunda femoral, popliteal and calf veins including the posterior tibial, peroneal and gastrocnemius veins when visible. The superficial great saphenous vein was also interrogated. Spectral Doppler was utilized to evaluate flow at rest and with distal augmentation maneuvers in the common femoral, femoral and popliteal veins. COMPARISON:  05/27/2015 FINDINGS: Contralateral Common Femoral Vein: Respiratory phasicity is normal and symmetric with the symptomatic side. No evidence of thrombus. Normal compressibility. Common Femoral Vein: No evidence of thrombus. Normal compressibility, respiratory phasicity and response to augmentation. Saphenofemoral Junction: No evidence of thrombus. Normal compressibility and flow on color Doppler imaging. Profunda Femoral Vein: No evidence of thrombus. Normal compressibility and flow on color Doppler imaging. Femoral Vein: No evidence of thrombus. Normal compressibility, respiratory phasicity and response to augmentation. Popliteal Vein: No evidence of thrombus. Normal compressibility, respiratory phasicity and response to augmentation. Calf Veins: No evidence of thrombus. Normal compressibility and flow on color Doppler imaging. Superficial Great Saphenous Vein: No evidence of thrombus. Normal compressibility and flow on color Doppler imaging. Venous Reflux:  None. Other Findings:  None. IMPRESSION: No evidence of deep venous thrombosis in the LEFT lower extremity. Electronically Signed   By: Lavonia Dana M.D.   On: 02/01/2017 18:17   Dg Chest Port 1 View  Result Date: 02/17/2017 CLINICAL DATA:  Fever EXAM: PORTABLE CHEST 1 VIEW COMPARISON:  01/30/2017. FINDINGS: 0938 hours. The cardio pericardial silhouette is enlarged. Interstitial markings are diffusely  coarsened with chronic features. 1 cm nodular density identified adjacent to the anterior right first rib and. This was not evident on prior studies from 09/01/2016 or 02/10/2015. Subsegmental atelectasis noted left lower lung. The visualized bony structures of the thorax are intact. IMPRESSION: 1 cm right upper lobe pulmonary nodule. CT chest recommended to further evaluate. These results will be called to the ordering clinician or representative by the Radiologist Assistant, and communication documented in the PACS or zVision Dashboard. Electronically Signed   By: Misty Stanley M.D.   On: 02/17/2017 10:13    Micro Results     Recent Results (from the past 240 hour(s))  Gastrointestinal Panel by PCR , Stool     Status: None   Collection Time: 02/15/17  4:15 AM  Result Value Ref Range Status   Campylobacter species NOT DETECTED NOT DETECTED Final   Plesimonas shigelloides NOT DETECTED NOT DETECTED Final   Salmonella species NOT DETECTED NOT DETECTED Final   Yersinia enterocolitica NOT DETECTED NOT DETECTED Final   Vibrio species NOT DETECTED NOT DETECTED Final   Vibrio cholerae NOT DETECTED NOT DETECTED Final   Enteroaggregative E coli (EAEC) NOT DETECTED NOT DETECTED Final   Enteropathogenic E coli (EPEC) NOT DETECTED NOT DETECTED Final   Enterotoxigenic E coli (ETEC) NOT DETECTED NOT DETECTED Final   Shiga like toxin producing E coli (STEC) NOT DETECTED NOT DETECTED Final   Shigella/Enteroinvasive E coli (EIEC) NOT DETECTED NOT DETECTED Final   Cryptosporidium NOT DETECTED NOT DETECTED Final   Cyclospora cayetanensis NOT  DETECTED NOT DETECTED Final   Entamoeba histolytica NOT DETECTED NOT DETECTED Final   Giardia lamblia NOT DETECTED NOT DETECTED Final   Adenovirus F40/41 NOT DETECTED NOT DETECTED Final   Astrovirus NOT DETECTED NOT DETECTED Final   Norovirus GI/GII NOT DETECTED NOT DETECTED Final   Rotavirus A NOT DETECTED NOT DETECTED Final   Sapovirus (I, II, IV, and V) NOT  DETECTED NOT DETECTED Final  C difficile quick scan w PCR reflex     Status: Abnormal   Collection Time: 02/15/17  4:15 AM  Result Value Ref Range Status   C Diff antigen POSITIVE (A) NEGATIVE Final   C Diff toxin POSITIVE (A) NEGATIVE Final   C Diff interpretation Toxin producing C. difficile detected.  Final    Comment: CRITICAL RESULT CALLED TO, READ BACK BY AND VERIFIED WITH: [facility] YUAL AT 4268 02/15/17.PMH   CULTURE, BLOOD (ROUTINE X 2) w Reflex to ID Panel     Status: None (Preliminary result)   Collection Time: 02/16/17  8:57 PM  Result Value Ref Range Status   Specimen Description BLOOD RIGHT ARM  Final   Special Requests BOTTLES DRAWN AEROBIC AND ANAEROBIC BCAV  Final   Culture NO GROWTH 3 DAYS  Final   Report Status PENDING  Incomplete  CULTURE, BLOOD (ROUTINE X 2) w Reflex to ID Panel     Status: None (Preliminary result)   Collection Time: 02/16/17  9:00 PM  Result Value Ref Range Status   Specimen Description BLOOD LEFT ANTECUBITAL  Final   Special Requests BOTTLES DRAWN AEROBIC AND ANAEROBIC BCAV  Final   Culture NO GROWTH 3 DAYS  Final   Report Status PENDING  Incomplete       Today   Subjective:   [name] today has No diarrhea, eager . to go home..   Objective:   Blood pressure (!) 112/40, pulse 62, temperature 98.2 F (36.8 C), temperature source Oral, resp. rate 18, height 5\' 7"  (1.702 m), weight 79.5 kg (175 lb 3.2 oz), SpO2 98 %.   Intake/Output Summary (Last 24 hours) at 02/19/17 1154 Last data filed at 02/19/17 0211  Gross per 24 hour  Intake                0 ml  Output              175 ml  Net             -175 ml    Exam Awake Alert, Oriented x 3, No new F.N deficits, Normal affect [place].AT,PERRAL Supple Neck,No JVD, No cervical lymphadenopathy appriciated.  Symmetrical Chest wall movement, Good air movement bilaterally, CTAB RRR,No Gallops,Rubs or new Murmurs, No Parasternal Heave +ve B.Sounds, Abd Soft, Non tender, No  organomegaly appriciated, No rebound -guarding or rigidity. No Cyanosis, Clubbing or edema, No new Rash or bruise  Data Review   CBC w Diff: Lab Results  Component Value Date   WBC 8.0 02/19/2017   HGB 7.5 (L) 02/19/2017   HGB 9.5 (L) 04/08/2015   HCT 22.8 (L) 02/19/2017   HCT 28.5 (L) 04/08/2015   PLT 340 02/19/2017   PLT 302 04/08/2015   LYMPHOPCT 7 01/30/2017   LYMPHOPCT 22.2 09/02/2013   MONOPCT 5 01/30/2017   MONOPCT 9.0 09/02/2013   EOSPCT 0 01/30/2017   EOSPCT 2.1 09/02/2013   BASOPCT 0 01/30/2017   BASOPCT 1.0 09/02/2013    CMP: Lab Results  Component Value Date   NA 141 02/19/2017   NA  138 04/08/2015   K 3.9 02/19/2017   K 4.0 04/08/2015   CL 116 (H) 02/19/2017   CL 108 04/08/2015   CO2 18 (L) 02/19/2017   CO2 25 04/08/2015   BUN 52 (H) 02/19/2017   BUN 60 (H) 04/08/2015   CREATININE 7.49 (H) 02/19/2017   CREATININE 4.04 (H) 04/08/2015   PROT 6.4 (L) 02/15/2017   PROT 7.0 04/09/2015   ALBUMIN 2.7 (L) 02/15/2017   ALBUMIN 3.1 (L) 04/09/2015   BILITOT 0.7 02/15/2017   BILITOT 0.5 04/09/2015   ALKPHOS 89 02/15/2017   ALKPHOS 104 04/09/2015   AST 19 02/15/2017   AST 19 04/09/2015   ALT 12 (L) 02/15/2017   ALT 13 (L) 04/09/2015  .   Total Time in preparing paper work, data evaluation and todays exam - 49 minutes  Lennyn Gange M.D on 02/19/2017 at 11:54 AM    Note: This dictation was prepared with Dragon dictation along with smaller phrase technology. Any transcriptional errors that result from this process are unintentional.

## 2017-02-19 NOTE — Progress Notes (Signed)
Interpreter utilized for discharge summary IV site removed.  Antibiotic prescription provided. Concerns addressed.  Discharged with grand daughter and wife at bedside

## 2017-02-21 ENCOUNTER — Ambulatory Visit: Payer: Medicare Other | Admitting: Cardiovascular Disease

## 2017-02-21 LAB — CULTURE, BLOOD (ROUTINE X 2)
Culture: NO GROWTH
Culture: NO GROWTH

## 2017-03-26 ENCOUNTER — Inpatient Hospital Stay
Admission: EM | Admit: 2017-03-26 | Discharge: 2017-04-04 | DRG: 371 | Disposition: A | Discharge status: Skilled Nursing Facility | Payer: Medicare Other | Attending: Internal Medicine | Admitting: Internal Medicine

## 2017-03-26 DIAGNOSIS — Z7984 Long term (current) use of oral hypoglycemic drugs: Secondary | ICD-10-CM

## 2017-03-26 DIAGNOSIS — N179 Acute kidney failure, unspecified: Secondary | ICD-10-CM

## 2017-03-26 DIAGNOSIS — M109 Gout, unspecified: Secondary | ICD-10-CM | POA: Diagnosis present

## 2017-03-26 DIAGNOSIS — K859 Acute pancreatitis without necrosis or infection, unspecified: Secondary | ICD-10-CM

## 2017-03-26 DIAGNOSIS — I214 Non-ST elevation (NSTEMI) myocardial infarction: Secondary | ICD-10-CM

## 2017-03-26 DIAGNOSIS — Z79899 Other long term (current) drug therapy: Secondary | ICD-10-CM

## 2017-03-26 DIAGNOSIS — E1140 Type 2 diabetes mellitus with diabetic neuropathy, unspecified: Secondary | ICD-10-CM | POA: Diagnosis present

## 2017-03-26 DIAGNOSIS — A0472 Enterocolitis due to Clostridium difficile, not specified as recurrent: Secondary | ICD-10-CM

## 2017-03-26 DIAGNOSIS — G9349 Other encephalopathy: Secondary | ICD-10-CM | POA: Diagnosis not present

## 2017-03-26 DIAGNOSIS — E1122 Type 2 diabetes mellitus with diabetic chronic kidney disease: Secondary | ICD-10-CM | POA: Diagnosis present

## 2017-03-26 DIAGNOSIS — E1121 Type 2 diabetes mellitus with diabetic nephropathy: Secondary | ICD-10-CM | POA: Diagnosis present

## 2017-03-26 DIAGNOSIS — D638 Anemia in other chronic diseases classified elsewhere: Secondary | ICD-10-CM | POA: Diagnosis present

## 2017-03-26 DIAGNOSIS — I248 Other forms of acute ischemic heart disease: Secondary | ICD-10-CM

## 2017-03-26 DIAGNOSIS — E860 Dehydration: Secondary | ICD-10-CM

## 2017-03-26 DIAGNOSIS — A0471 Enterocolitis due to Clostridium difficile, recurrent: Secondary | ICD-10-CM | POA: Diagnosis not present

## 2017-03-26 DIAGNOSIS — R109 Unspecified abdominal pain: Secondary | ICD-10-CM | POA: Diagnosis not present

## 2017-03-26 DIAGNOSIS — Z7982 Long term (current) use of aspirin: Secondary | ICD-10-CM

## 2017-03-26 DIAGNOSIS — I2489 Other forms of acute ischemic heart disease: Secondary | ICD-10-CM

## 2017-03-26 DIAGNOSIS — E8809 Other disorders of plasma-protein metabolism, not elsewhere classified: Secondary | ICD-10-CM | POA: Diagnosis present

## 2017-03-26 DIAGNOSIS — R0602 Shortness of breath: Secondary | ICD-10-CM

## 2017-03-26 DIAGNOSIS — N186 End stage renal disease: Secondary | ICD-10-CM | POA: Diagnosis present

## 2017-03-26 DIAGNOSIS — D631 Anemia in chronic kidney disease: Secondary | ICD-10-CM | POA: Diagnosis present

## 2017-03-26 DIAGNOSIS — E872 Acidosis: Secondary | ICD-10-CM | POA: Diagnosis present

## 2017-03-26 DIAGNOSIS — N189 Chronic kidney disease, unspecified: Secondary | ICD-10-CM

## 2017-03-26 DIAGNOSIS — K529 Noninfective gastroenteritis and colitis, unspecified: Secondary | ICD-10-CM

## 2017-03-26 DIAGNOSIS — R338 Other retention of urine: Secondary | ICD-10-CM | POA: Diagnosis present

## 2017-03-26 DIAGNOSIS — E785 Hyperlipidemia, unspecified: Secondary | ICD-10-CM | POA: Diagnosis present

## 2017-03-26 DIAGNOSIS — N401 Enlarged prostate with lower urinary tract symptoms: Secondary | ICD-10-CM | POA: Diagnosis present

## 2017-03-26 DIAGNOSIS — I120 Hypertensive chronic kidney disease with stage 5 chronic kidney disease or end stage renal disease: Secondary | ICD-10-CM | POA: Diagnosis present

## 2017-03-26 DIAGNOSIS — R5383 Other fatigue: Secondary | ICD-10-CM

## 2017-03-26 LAB — COMPREHENSIVE METABOLIC PANEL
ALT: 14 U/L — AB (ref 17–63)
AST: 20 U/L (ref 15–41)
Albumin: 3.3 g/dL — ABNORMAL LOW (ref 3.5–5.0)
Alkaline Phosphatase: 98 U/L (ref 38–126)
Anion gap: 10 (ref 5–15)
BUN: 97 mg/dL — AB (ref 6–20)
CHLORIDE: 116 mmol/L — AB (ref 101–111)
CO2: 12 mmol/L — ABNORMAL LOW (ref 22–32)
Calcium: 9 mg/dL (ref 8.9–10.3)
Creatinine, Ser: 11.51 mg/dL — ABNORMAL HIGH (ref 0.61–1.24)
GFR calc Af Amer: 4 mL/min — ABNORMAL LOW (ref >60)
GFR calc non Af Amer: 4 mL/min — ABNORMAL LOW (ref >60)
Glucose, Bld: 126 mg/dL — ABNORMAL HIGH (ref 65–99)
POTASSIUM: 4.3 mmol/L (ref 3.5–5.1)
SODIUM: 138 mmol/L (ref 135–145)
TOTAL PROTEIN: 7 g/dL (ref 6.5–8.1)
Total Bilirubin: 0.5 mg/dL (ref 0.3–1.2)

## 2017-03-26 LAB — CBC
HEMATOCRIT: 32.1 % — AB (ref 40.0–52.0)
HEMOGLOBIN: 10.4 g/dL — AB (ref 13.0–18.0)
MCH: 30.9 pg (ref 26.0–34.0)
MCHC: 32.5 g/dL (ref 32.0–36.0)
MCV: 95.1 fL (ref 80.0–100.0)
Platelets: 272 10*3/uL (ref 150–440)
RBC: 3.38 MIL/uL — AB (ref 4.40–5.90)
RDW: 15 % — ABNORMAL HIGH (ref 11.5–14.5)
WBC: 12.2 10*3/uL — AB (ref 3.8–10.6)

## 2017-03-26 LAB — LIPASE, BLOOD: LIPASE: 159 U/L — AB (ref 11–51)

## 2017-03-26 NOTE — ED Triage Notes (Signed)
Pt to triage via wheelchair. Pt reports via [facility] interpreter Sherre Scarlet that he has been having diarrhea and abd pain for about 10 days. Reports there is what looks like mucous in the stool. Denies blood in his stool. Reports 5 to 6 times a day having diarrhea.

## 2017-03-27 ENCOUNTER — Emergency Department: Payer: Medicare Other

## 2017-03-27 ENCOUNTER — Inpatient Hospital Stay (HOSPITAL_COMMUNITY)
Admit: 2017-03-27 | Discharge: 2017-03-27 | Disposition: A | Discharge status: Home / Self Care | Payer: Medicare Other | Attending: Physician Assistant | Admitting: Physician Assistant

## 2017-03-27 DIAGNOSIS — A0472 Enterocolitis due to Clostridium difficile, not specified as recurrent: Secondary | ICD-10-CM | POA: Diagnosis not present

## 2017-03-27 DIAGNOSIS — I248 Other forms of acute ischemic heart disease: Secondary | ICD-10-CM

## 2017-03-27 DIAGNOSIS — Z7984 Long term (current) use of oral hypoglycemic drugs: Secondary | ICD-10-CM | POA: Diagnosis not present

## 2017-03-27 DIAGNOSIS — I120 Hypertensive chronic kidney disease with stage 5 chronic kidney disease or end stage renal disease: Secondary | ICD-10-CM | POA: Diagnosis present

## 2017-03-27 DIAGNOSIS — G9341 Metabolic encephalopathy: Secondary | ICD-10-CM | POA: Diagnosis not present

## 2017-03-27 DIAGNOSIS — R7989 Other specified abnormal findings of blood chemistry: Secondary | ICD-10-CM | POA: Diagnosis not present

## 2017-03-27 DIAGNOSIS — E1121 Type 2 diabetes mellitus with diabetic nephropathy: Secondary | ICD-10-CM | POA: Diagnosis present

## 2017-03-27 DIAGNOSIS — E1122 Type 2 diabetes mellitus with diabetic chronic kidney disease: Secondary | ICD-10-CM | POA: Diagnosis present

## 2017-03-27 DIAGNOSIS — G9349 Other encephalopathy: Secondary | ICD-10-CM | POA: Diagnosis not present

## 2017-03-27 DIAGNOSIS — A0471 Enterocolitis due to Clostridium difficile, recurrent: Secondary | ICD-10-CM | POA: Diagnosis present

## 2017-03-27 DIAGNOSIS — E860 Dehydration: Secondary | ICD-10-CM | POA: Diagnosis present

## 2017-03-27 DIAGNOSIS — N401 Enlarged prostate with lower urinary tract symptoms: Secondary | ICD-10-CM | POA: Diagnosis present

## 2017-03-27 DIAGNOSIS — E1140 Type 2 diabetes mellitus with diabetic neuropathy, unspecified: Secondary | ICD-10-CM | POA: Diagnosis present

## 2017-03-27 DIAGNOSIS — N186 End stage renal disease: Secondary | ICD-10-CM | POA: Diagnosis present

## 2017-03-27 DIAGNOSIS — R338 Other retention of urine: Secondary | ICD-10-CM | POA: Diagnosis present

## 2017-03-27 DIAGNOSIS — E785 Hyperlipidemia, unspecified: Secondary | ICD-10-CM | POA: Diagnosis present

## 2017-03-27 DIAGNOSIS — K529 Noninfective gastroenteritis and colitis, unspecified: Secondary | ICD-10-CM

## 2017-03-27 DIAGNOSIS — Z7982 Long term (current) use of aspirin: Secondary | ICD-10-CM | POA: Diagnosis not present

## 2017-03-27 DIAGNOSIS — E8809 Other disorders of plasma-protein metabolism, not elsewhere classified: Secondary | ICD-10-CM | POA: Diagnosis present

## 2017-03-27 DIAGNOSIS — N179 Acute kidney failure, unspecified: Secondary | ICD-10-CM | POA: Diagnosis present

## 2017-03-27 DIAGNOSIS — N189 Chronic kidney disease, unspecified: Secondary | ICD-10-CM | POA: Diagnosis not present

## 2017-03-27 DIAGNOSIS — Z79899 Other long term (current) drug therapy: Secondary | ICD-10-CM | POA: Diagnosis not present

## 2017-03-27 DIAGNOSIS — E872 Acidosis: Secondary | ICD-10-CM | POA: Diagnosis present

## 2017-03-27 DIAGNOSIS — R109 Unspecified abdominal pain: Secondary | ICD-10-CM | POA: Diagnosis present

## 2017-03-27 LAB — URINALYSIS, COMPLETE (UACMP) WITH MICROSCOPIC
BACTERIA UA: NONE SEEN
BILIRUBIN URINE: NEGATIVE
GLUCOSE, UA: 50 mg/dL — AB
HGB URINE DIPSTICK: NEGATIVE
KETONES UR: NEGATIVE mg/dL
NITRITE: NEGATIVE
PH: 5 (ref 5.0–8.0)
PROTEIN: 100 mg/dL — AB
Specific Gravity, Urine: 1.014 (ref 1.005–1.030)

## 2017-03-27 LAB — CBC
HEMATOCRIT: 28.2 % — AB (ref 40.0–52.0)
Hemoglobin: 9 g/dL — ABNORMAL LOW (ref 13.0–18.0)
MCH: 30 pg (ref 26.0–34.0)
MCHC: 31.8 g/dL — AB (ref 32.0–36.0)
MCV: 94.2 fL (ref 80.0–100.0)
Platelets: 238 10*3/uL (ref 150–440)
RBC: 2.99 MIL/uL — ABNORMAL LOW (ref 4.40–5.90)
RDW: 15.2 % — AB (ref 11.5–14.5)
WBC: 11.4 10*3/uL — ABNORMAL HIGH (ref 3.8–10.6)

## 2017-03-27 LAB — PROTIME-INR
INR: 1.35
PROTHROMBIN TIME: 16.8 s — AB (ref 11.4–15.2)

## 2017-03-27 LAB — ECHOCARDIOGRAM COMPLETE
Height: 68 in
Weight: 2800 oz

## 2017-03-27 LAB — BASIC METABOLIC PANEL
Anion gap: 11 (ref 5–15)
BUN: 106 mg/dL — AB (ref 6–20)
CHLORIDE: 118 mmol/L — AB (ref 101–111)
CO2: 11 mmol/L — AB (ref 22–32)
Calcium: 8.5 mg/dL — ABNORMAL LOW (ref 8.9–10.3)
Creatinine, Ser: 11.83 mg/dL — ABNORMAL HIGH (ref 0.61–1.24)
GFR calc Af Amer: 4 mL/min — ABNORMAL LOW (ref >60)
GFR calc non Af Amer: 4 mL/min — ABNORMAL LOW (ref >60)
GLUCOSE: 97 mg/dL (ref 65–99)
POTASSIUM: 4.2 mmol/L (ref 3.5–5.1)
Sodium: 140 mmol/L (ref 135–145)

## 2017-03-27 LAB — APTT: aPTT: 158 seconds — ABNORMAL HIGH (ref 24–36)

## 2017-03-27 LAB — C DIFFICILE QUICK SCREEN W PCR REFLEX
C DIFFICILE (CDIFF) INTERP: DETECTED
C DIFFICILE (CDIFF) TOXIN: POSITIVE — AB
C DIFFICLE (CDIFF) ANTIGEN: POSITIVE — AB

## 2017-03-27 LAB — HEPARIN LEVEL (UNFRACTIONATED): Heparin Unfractionated: 0.68 IU/mL (ref 0.30–0.70)

## 2017-03-27 LAB — TROPONIN I
TROPONIN I: 0.08 ng/mL — AB (ref <0.03)
Troponin I: 0.12 ng/mL (ref <0.03)
Troponin I: 0.08 ng/mL (ref <0.03)
Troponin I: 0.05 ng/mL (ref <0.03)
Troponin I: 0.08 ng/mL (ref <0.03)

## 2017-03-27 LAB — LACTIC ACID, PLASMA: Lactic Acid, Venous: 0.9 mmol/L (ref 0.5–1.9)

## 2017-03-27 MED ORDER — POLYVINYL ALCOHOL 1.4 % OP SOLN
1.0000 [drp] | Freq: Two times a day (BID) | OPHTHALMIC | Status: DC
  Administered 2017-03-27 – 2017-04-04 (×14): 1 [drp] via OPHTHALMIC
  Filled 2017-03-27 (×2): qty 15

## 2017-03-27 MED ORDER — SODIUM BICARBONATE 650 MG PO TABS
650.0000 mg | ORAL_TABLET | Freq: Two times a day (BID) | ORAL | Status: DC
  Administered 2017-03-27 – 2017-04-04 (×10): 650 mg via ORAL
  Filled 2017-03-27 (×11): qty 1

## 2017-03-27 MED ORDER — PROMETHAZINE HCL 25 MG/ML IJ SOLN
12.5000 mg | Freq: Four times a day (QID) | INTRAMUSCULAR | Status: DC | PRN
  Administered 2017-03-27 (×2): 12.5 mg via INTRAVENOUS
  Filled 2017-03-27 (×2): qty 1

## 2017-03-27 MED ORDER — GABAPENTIN 100 MG PO CAPS: 100.0000 mg | ORAL_CAPSULE | Freq: Three times a day (TID) | ORAL | Status: DC | PRN

## 2017-03-27 MED ORDER — ONDANSETRON HCL 4 MG PO TABS
4.0000 mg | ORAL_TABLET | Freq: Four times a day (QID) | ORAL | Status: DC | PRN
Start: 2017-03-27 — End: 2017-04-04

## 2017-03-27 MED ORDER — METOPROLOL SUCCINATE ER 25 MG PO TB24
25.0000 mg | ORAL_TABLET | Freq: Every day | ORAL | Status: DC
  Administered 2017-04-01 – 2017-04-04 (×3): 25 mg via ORAL
  Filled 2017-03-27 (×4): qty 1

## 2017-03-27 MED ORDER — SODIUM CHLORIDE 0.9% FLUSH
3.0000 mL | Freq: Two times a day (BID) | INTRAVENOUS | Status: DC
  Administered 2017-03-27 – 2017-03-29 (×4): 3 mL via INTRAVENOUS

## 2017-03-27 MED ORDER — ADULT MULTIVITAMIN W/MINERALS CH
1.0000 | ORAL_TABLET | Freq: Every day | ORAL | Status: DC
  Administered 2017-03-28 – 2017-04-04 (×5): 1 via ORAL
  Filled 2017-03-27 (×11): qty 1

## 2017-03-27 MED ORDER — HYDRALAZINE HCL 50 MG PO TABS
50.0000 mg | ORAL_TABLET | Freq: Three times a day (TID) | ORAL | Status: DC
  Administered 2017-03-27 – 2017-04-04 (×10): 50 mg via ORAL
  Filled 2017-03-27 (×11): qty 1

## 2017-03-27 MED ORDER — SODIUM CHLORIDE 0.9 % IV SOLN
INTRAVENOUS | Status: DC
  Administered 2017-03-27: 06:00:00 via INTRAVENOUS

## 2017-03-27 MED ORDER — MORPHINE SULFATE (PF) 4 MG/ML IV SOLN
4.0000 mg | Freq: Once | INTRAVENOUS | Status: AC
  Administered 2017-03-27: 4 mg via INTRAVENOUS
  Filled 2017-03-27: qty 1

## 2017-03-27 MED ORDER — ONDANSETRON HCL 4 MG/2ML IJ SOLN
4.0000 mg | Freq: Once | INTRAMUSCULAR | Status: AC
  Administered 2017-03-27: 4 mg via INTRAVENOUS
  Filled 2017-03-27: qty 2

## 2017-03-27 MED ORDER — ONDANSETRON HCL 4 MG/2ML IJ SOLN
4.0000 mg | Freq: Four times a day (QID) | INTRAMUSCULAR | Status: DC | PRN
  Administered 2017-03-27 (×2): 4 mg via INTRAVENOUS
  Filled 2017-03-27 (×2): qty 2

## 2017-03-27 MED ORDER — SODIUM CHLORIDE 0.9 % IV BOLUS (SEPSIS)
500.0000 mL | Freq: Once | INTRAVENOUS | Status: AC
  Administered 2017-03-27: 500 mL via INTRAVENOUS

## 2017-03-27 MED ORDER — CIPROFLOXACIN IN D5W 400 MG/200ML IV SOLN
400.0000 mg | Freq: Once | INTRAVENOUS | Status: AC
  Administered 2017-03-27: 400 mg via INTRAVENOUS
  Filled 2017-03-27: qty 200

## 2017-03-27 MED ORDER — PANTOPRAZOLE SODIUM 40 MG PO TBEC
40.0000 mg | DELAYED_RELEASE_TABLET | Freq: Every day | ORAL | Status: DC
  Administered 2017-03-28 – 2017-04-04 (×5): 40 mg via ORAL
  Filled 2017-03-27 (×7): qty 1

## 2017-03-27 MED ORDER — DOXAZOSIN MESYLATE 4 MG PO TABS
2.0000 mg | ORAL_TABLET | Freq: Every day | ORAL | Status: DC
  Administered 2017-03-27: 2 mg via ORAL
  Filled 2017-03-27: qty 1

## 2017-03-27 MED ORDER — IOPAMIDOL (ISOVUE-300) INJECTION 61%
30.0000 mL | INTRAVENOUS | Status: AC
  Administered 2017-03-27: 30 mL via ORAL

## 2017-03-27 MED ORDER — FERROUS SULFATE 325 (65 FE) MG PO TABS
325.0000 mg | ORAL_TABLET | Freq: Every day | ORAL | Status: DC
  Administered 2017-03-28 – 2017-04-04 (×5): 325 mg via ORAL
  Filled 2017-03-27 (×7): qty 1

## 2017-03-27 MED ORDER — ATORVASTATIN CALCIUM 20 MG PO TABS
20.0000 mg | ORAL_TABLET | Freq: Every day | ORAL | Status: DC
  Administered 2017-03-27 – 2017-04-04 (×6): 20 mg via ORAL
  Filled 2017-03-27 (×6): qty 1

## 2017-03-27 MED ORDER — MORPHINE SULFATE (PF) 4 MG/ML IV SOLN
2.0000 mg | INTRAVENOUS | Status: DC | PRN
  Administered 2017-03-27 (×2): 2 mg via INTRAVENOUS
  Filled 2017-03-27 (×2): qty 1

## 2017-03-27 MED ORDER — ASPIRIN EC 81 MG PO TBEC
81.0000 mg | DELAYED_RELEASE_TABLET | Freq: Every day | ORAL | Status: DC
  Administered 2017-03-28 – 2017-04-04 (×5): 81 mg via ORAL
  Filled 2017-03-27 (×7): qty 1

## 2017-03-27 MED ORDER — METRONIDAZOLE IN NACL 5-0.79 MG/ML-% IV SOLN: 500.0000 mg | Freq: Three times a day (TID) | INTRAVENOUS | Status: DC

## 2017-03-27 MED ORDER — METRONIDAZOLE IN NACL 5-0.79 MG/ML-% IV SOLN: 500.0000 mg | Freq: Once | INTRAVENOUS | Status: DC

## 2017-03-27 MED ORDER — GLUCERNA SHAKE PO LIQD
237.0000 mL | Freq: Three times a day (TID) | ORAL | Status: DC
  Administered 2017-04-01 – 2017-04-04 (×6): 237 mL via ORAL

## 2017-03-27 MED ORDER — VANCOMYCIN 50 MG/ML ORAL SOLUTION
125.0000 mg | Freq: Four times a day (QID) | ORAL | Status: DC
  Administered 2017-03-27 – 2017-03-28 (×6): 125 mg via ORAL
  Filled 2017-03-27 (×11): qty 2.5

## 2017-03-27 MED ORDER — BACLOFEN 10 MG PO TABS
10.0000 mg | ORAL_TABLET | Freq: Three times a day (TID) | ORAL | Status: DC
  Administered 2017-03-27 – 2017-03-28 (×3): 10 mg via ORAL
  Filled 2017-03-27 (×5): qty 1

## 2017-03-27 MED ORDER — ACETAMINOPHEN 325 MG PO TABS
650.0000 mg | ORAL_TABLET | Freq: Four times a day (QID) | ORAL | Status: DC | PRN
  Administered 2017-03-31 – 2017-04-03 (×2): 650 mg via ORAL
  Filled 2017-03-27 (×2): qty 2

## 2017-03-27 MED ORDER — HEPARIN BOLUS VIA INFUSION
4000.0000 [IU] | Freq: Once | INTRAVENOUS | Status: AC
  Administered 2017-03-27: 4000 [IU] via INTRAVENOUS
  Filled 2017-03-27: qty 4000

## 2017-03-27 MED ORDER — TAMSULOSIN HCL 0.4 MG PO CAPS
0.4000 mg | ORAL_CAPSULE | Freq: Every day | ORAL | Status: DC
  Administered 2017-03-27 – 2017-04-04 (×6): 0.4 mg via ORAL
  Filled 2017-03-27 (×6): qty 1

## 2017-03-27 MED ORDER — CARBOXYMETHYLCELLULOSE SODIUM 1 % OP SOLN
1.0000 [drp] | Freq: Two times a day (BID) | OPHTHALMIC | Status: DC
  Filled 2017-03-27 (×14): qty 15

## 2017-03-27 MED ORDER — AMLODIPINE BESYLATE 5 MG PO TABS
2.5000 mg | ORAL_TABLET | Freq: Every day | ORAL | Status: DC
  Administered 2017-04-01 – 2017-04-04 (×3): 2.5 mg via ORAL
  Filled 2017-03-27 (×3): qty 1

## 2017-03-27 MED ORDER — ACETAMINOPHEN 650 MG RE SUPP: 650.0000 mg | Freq: Four times a day (QID) | RECTAL | Status: DC | PRN

## 2017-03-27 MED ORDER — HEPARIN (PORCINE) IN NACL 100-0.45 UNIT/ML-% IJ SOLN
1000.0000 [IU]/h | INTRAMUSCULAR | Status: DC
  Administered 2017-03-27: 1000 [IU]/h via INTRAVENOUS
  Filled 2017-03-27: qty 250

## 2017-03-27 MED ORDER — SODIUM BICARBONATE 8.4 % IV SOLN
INTRAVENOUS | Status: DC
  Administered 2017-03-27 – 2017-03-29 (×4): via INTRAVENOUS
  Filled 2017-03-27 (×5): qty 150

## 2017-03-27 NOTE — Progress Notes (Signed)
Patient has had problems with nausea, vomiting and abdominal pain during the shift. MD aware. Patient's nausea has reduced when given IV zofran and IV phenergan. Patient remains NPO. Patient agreed to having dialysis this evening; Dr. Juleen China notified and orders for vascular surgery consult placed.

## 2017-03-27 NOTE — ED Provider Notes (Signed)
[facility] Emergency Department Provider Note   ____________________________________________   First MD Initiated Contact with Patient 03/27/17 0000     (approximate)  I have reviewed the triage vital signs and the nursing notes.   HISTORY  Chief Complaint Abdominal Pain and Diarrhea    HPI Martin Gilbert is a 76 y.o. male who comes into the hospital today with diarrhea and body aches. His daughter reports as been going on for the past 2 weeks. The patient decided to come into the hospital for evaluation. The patient had seen his kidney doctor and was also told to come in. The patient does not get dialysis but has very significant kidney disease. He gets shots every month for low iron. He reports that he has abdominal pain all over his abdomen that he rates a 9 out of 10 in intensity. He is also having some nausea with no vomiting. He reports that the diarrhea is up to 7 times a day and he was on antibiotics for an infection about a month ago. The patient has not had any swelling nor has he had any fever but has been urinating less. He has not been eating or drinking much and his doctor is concerned about dehydration. He sees [facility] nephrology. The patient is not had any sick contacts. The patient reports that he's had some mild shortness of breath with some chest pain. He is here today for evaluation.   Past Medical History:  Diagnosis Date  . Back pain   . BPH (benign prostatic hyperplasia)   . CKD (chronic kidney disease), stage IV ([place])   . Diabetes mellitus without complication ([place])   . HTN (hypertension)   . Hyperlipidemia     Patient Active Problem List   Diagnosis Date Noted  . Protein-calorie malnutrition, severe 02/18/2017  . C. difficile colitis 02/15/2017  . UTI (urinary tract infection) 02/02/2017  . E coli infection 02/02/2017  . Lactic acidosis 02/02/2017  . Diabetic neuropathy ([place]) 02/02/2017  . Left-sided chest wall pain  02/02/2017  . Fall 02/02/2017  . Generalized weakness 02/02/2017  . Pink eye, left 02/02/2017  . Leukocytosis 02/02/2017  . Anemia of chronic disease 02/02/2017  . Nausea 02/02/2017  . Sepsis ([place]) 01/31/2017  . Essential hypertension 11/28/2015  . Pain in the chest 02/15/2015  . Neck pain 02/15/2015  . Diabetes type 2, uncontrolled ([place]) 02/15/2015  . Chronic kidney disease, stage IV (severe) ([place]) 02/15/2015  . Frequent headaches 02/15/2015    Past Surgical History:  Procedure Laterality Date  . hernia repair      Prior to Admission medications   Medication Sig Start Date End Date Taking? Authorizing Provider  acetaminophen (TYLENOL) 500 MG tablet Take 500 mg by mouth every 8 (eight) hours as needed.   Yes Historical Provider, MD  allopurinol (ZYLOPRIM) 100 MG tablet Take 100 mg by mouth daily. 10/17/15  Yes Historical Provider, MD  amLODipine (NORVASC) 2.5 MG tablet Take 2.5 mg by mouth daily.   Yes Historical Provider, MD  aspirin 81 MG tablet Take 81 mg by mouth daily.   Yes Historical Provider, MD  atorvastatin (LIPITOR) 20 MG tablet Take 20 mg by mouth daily.   Yes Historical Provider, MD  doxazosin (CARDURA) 2 MG tablet Take 2 mg by mouth at bedtime.    Yes Historical Provider, MD  ferrous sulfate 325 (65 FE) MG tablet Take 325 mg by mouth daily with breakfast. 08/30/15  Yes Historical Provider, MD  gabapentin (NEURONTIN) 100  MG capsule Take 1 capsule (100 mg total) by mouth every 8 (eight) hours as needed (tingling or numbness in feet). 02/02/17  Yes Theodoro Grist, MD  glipiZIDE (GLUCOTROL) 5 MG tablet Take 5 mg by mouth daily. 12/27/16  Yes Historical Provider, MD  hydrALAZINE (APRESOLINE) 50 MG tablet Take 50 mg by mouth 3 (three) times daily.   Yes Historical Provider, MD  metoprolol succinate (TOPROL-XL) 25 MG 24 hr tablet Take 25 mg by mouth daily.   Yes Historical Provider, MD  Multiple Vitamin (MULTI-VITAMINS) TABS Take 1 tablet by mouth daily.   Yes Historical Provider,  MD  ondansetron (ZOFRAN) 4 MG tablet Take 1 tablet (4 mg total) by mouth every 6 (six) hours as needed for nausea. 02/02/17  Yes Theodoro Grist, MD  pantoprazole (PROTONIX) 40 MG tablet Take 1 tablet (40 mg total) by mouth daily. 02/03/17  Yes Theodoro Grist, MD  sodium bicarbonate 650 MG tablet Take 650 mg by mouth 2 (two) times daily.    Yes Historical Provider, MD  tamsulosin (FLOMAX) 0.4 MG CAPS capsule Take 1 capsule (0.4 mg total) by mouth daily after supper. 02/19/17  Yes Epifanio Lesches, MD  Vitamin D, Cholecalciferol, 1000 UNITS TABS Take by mouth.   Yes Historical Provider, MD  baclofen (LIORESAL) 10 MG tablet Take 1 tablet (10 mg total) by mouth 3 (three) times daily. Patient not taking: Reported on 01/30/2017 05/27/16   [name], PA-C  carbamide peroxide (DEBROX) 6.5 % otic solution Place 5 drops into the right ear 2 (two) times daily. Patient not taking: Reported on 01/30/2017 05/27/16 05/27/17  [name], PA-C  feeding supplement, GLUCERNA SHAKE, (GLUCERNA SHAKE) LIQD Take 237 mLs by mouth 3 (three) times daily between meals. 02/02/17   Theodoro Grist, MD  Polyvinyl Alcohol-Povidone (REFRESH OP) Place 1 drop into both eyes 2 (two) times daily.    Historical Provider, MD  vancomycin (VANCOCIN) 50 mg/mL oral solution Take 2.5 mLs (125 mg total) by mouth every 6 (six) hours. Patient not taking: Reported on 03/27/2017 02/19/17   Epifanio Lesches, MD    Allergies Patient has no active allergies.  Family History  Problem Relation Age of Onset  . Diabetes Neg Hx   . Hypertension Neg Hx     Social History Social History  Substance Use Topics  . Smoking status: Never Smoker  . Smokeless tobacco: Never Used  . Alcohol use No     Comment: Drank in the past, but has stopped for several years.     Review of Systems Constitutional: No fever/chills Eyes: No visual changes. ENT: No sore throat. Cardiovascular: chest pain. Respiratory:  shortness of breath. Gastrointestinal:  abdominal pain, nausea, and diarrhea, no vomiting. No constipation. Genitourinary: Negative for dysuria. Musculoskeletal: Negative for back pain. Skin: Negative for rash. Neurological: Negative for headaches, focal weakness or numbness.  10-point ROS otherwise negative.  ____________________________________________   PHYSICAL EXAM:  VITAL SIGNS: ED Triage Vitals [03/26/17 2006]  Enc Vitals Group     BP (!) 130/39     Pulse Rate 88     Resp 18     Temp 98.2 F (36.8 C)     Temp Source Oral     SpO2 98 %     Weight 175 lb (79.4 kg)     Height 5\' 8"  (1.727 m)     Head Circumference      Peak Flow      Pain Score 9     Pain Loc  Pain Edu?      Excl. in [place]?     Constitutional: Alert and oriented. Well appearing and in moderate distress. Eyes: Conjunctivae are normal. PERRL. EOMI. Head: Atraumatic. Nose: No congestion/rhinnorhea. Mouth/Throat: Mucous membranes are moist.  Oropharynx non-erythematous. Cardiovascular: Normal rate, regular rhythm. Grossly normal heart sounds.  Good peripheral circulation. Respiratory: Normal respiratory effort.  No retractions. Lungs CTAB. Gastrointestinal: Soft and nontender. No distention, positive bowel sounds Musculoskeletal: No lower extremity tenderness nor edema.   Neurologic:  Normal speech and language.  Skin:  Skin is warm, dry and intact.  Psychiatric: Mood and affect are normal.   ____________________________________________   LABS (all labs ordered are listed, but only abnormal results are displayed)  Labs Reviewed  LIPASE, BLOOD - Abnormal; Notable for the following:       Result Value   Lipase 159 (*)    All other components within normal limits  COMPREHENSIVE METABOLIC PANEL - Abnormal; Notable for the following:    Chloride 116 (*)    CO2 12 (*)    Glucose, Bld 126 (*)    BUN 97 (*)    Creatinine, Ser 11.51 (*)    Albumin 3.3 (*)    ALT 14 (*)    GFR calc non Af Amer 4 (*)    GFR calc Af Amer 4 (*)    All  other components within normal limits  CBC - Abnormal; Notable for the following:    WBC 12.2 (*)    RBC 3.38 (*)    Hemoglobin 10.4 (*)    HCT 32.1 (*)    RDW 15.0 (*)    All other components within normal limits  TROPONIN I - Abnormal; Notable for the following:    Troponin I 0.12 (*)    All other components within normal limits  LACTIC ACID, PLASMA  URINALYSIS, COMPLETE (UACMP) WITH MICROSCOPIC  TROPONIN I   ____________________________________________  EKG  ED ECG REPORT I, Loney Hering, the attending physician, personally viewed and interpreted this ECG.   Date: 03/27/2017  EKG Time: 0056  Rate: 95  Rhythm: normal sinus rhythm, RBBB  Axis: left axis deviation  Intervals:none  ST&T Change: Flipped T waves in leads V3 and II  ____________________________________________  RADIOLOGY  CT abd and pelvis ____________________________________________   PROCEDURES  Procedure(s) performed: None  Procedures  Critical Care performed: No  ____________________________________________   INITIAL IMPRESSION / ASSESSMENT AND PLAN / ED COURSE  Pertinent labs & imaging results that were available during my care of the patient were reviewed by me and considered in my medical decision making (see chart for details).  This is a 76 year old who comes into the hospital today with some abdominal pain and diarrhea. The patient has had symptoms for 2 weeks. He was on antibiotics prior to the symptoms so there is a concern for C. difficile. The patient also has an elevated creatinine. His creatinine is normally in the 6-7 range and today it is 11.5. I did order a lactic acid and I will give the patient a 500, bolus of normal saline. His lipase is 159 with a concern for pancreatitis. I will give the patient some morphine and Zofran and reassess him once I have received the results of his CT scan.  Clinical Course as of Mar 27 258  Thu Mar 27, 2017  0238 1. Mild diffuse wall  thickening along the entirety of the colon, with vague diffuse soft tissue inflammation. This is concerning for acute infectious or inflammatory  colitis. 2. Mildly nodular contour of the liver raises raises question for mild hepatic cirrhosis. 3. Mild bilateral renal atrophy noted. 4. Enlarged prostate noted. 5. Scattered coronary artery calcifications.   CT Abdomen Pelvis Wo Contrast [AW]  7741 CT Abdomen Pelvis Wo Contrast [AW]    Clinical Course User Index [AW] Loney Hering, MD   The patient's CT shows some diffuse wall thickening along the entire colon. With some concern for infectious colitis. I will give the patient some ciprofloxacin and Flagyl. The patient also appears to have pancreatitis and his cardiac enzymes are elevated. I will give him a second bolus of normal saline but he will be admitted to the hospitalist service. I will also start the patient on heparin. I discussed this with the patient and his daughter.  ____________________________________________   FINAL CLINICAL IMPRESSION(S) / ED DIAGNOSES  Final diagnoses:  Colitis  Acute renal failure superimposed on chronic kidney disease, unspecified CKD stage, unspecified acute renal failure type ([place])  Dehydration  NSTEMI (non-ST elevated myocardial infarction) ([place])  Acute pancreatitis, unspecified complication status, unspecified pancreatitis type      NEW MEDICATIONS STARTED DURING THIS VISIT:  New Prescriptions   No medications on file     Note:  This document was prepared using Dragon voice recognition software and may include unintentional dictation errors.    Loney Hering, MD 03/27/17 [phone]

## 2017-03-27 NOTE — Progress Notes (Signed)
   Discussed with IM. This is not a NSTEMI. No need for formal cardiology consult. Will stop heparin gtt. Can check echo.

## 2017-03-27 NOTE — Care Management (Signed)
Patient from home with recent admission for colitis.  He is currently under treatment for C Diff with oral vancomycin solution.  Patient is followed by [facility] nephology.  Dr Juleen China  spoke with patient and family and even though patient is in need of dialysis, he is currently declining.  he has asked Dr Juleen China to speak with his nephrologist at [facility].  His creatinine is 11.83.

## 2017-03-27 NOTE — Progress Notes (Signed)
[facility]  ROUNDING NOTE   Subjective:   Martin Gilbert admitted to [facility] on 03/26/2017 for Dehydration [E86.0] Colitis [K52.9] NSTEMI (non-ST elevated myocardial infarction) [facility]) [I21.4] Acute pancreatitis, unspecified complication status, unspecified pancreatitis type [K85.90] Acute renal failure superimposed on chronic kidney disease, unspecified CKD stage, unspecified acute renal failure type ([place]) [N17.9, N18.9]   Family at bedside. Patient states he is unsure he wants dialysis at this time. Family would like him to initiate dialysis.   Objective:  Vital signs in last 24 hours:  Temp:  [97.5 F (36.4 C)-98.8 F (37.1 C)] 97.5 F (36.4 C) (04/12 1055) Pulse Rate:  [88-96] 93 (04/12 1055) Resp:  [17-18] 18 (04/12 1055) BP: (130-153)/(39-66) 140/39 (04/12 1055) SpO2:  [95 %-99 %] 95 % (04/12 1055) Weight:  [79.4 kg (175 lb)] 79.4 kg (175 lb) (04/11 2006)  Weight change:  Filed Weights   03/26/17 2006  Weight: 79.4 kg (175 lb)    Intake/Output: I/O last 3 completed shifts: In: 792.5 [I.V.:92.5; IV Piggyback:700] Out: -    Intake/Output this shift:  Total I/O In: 120 [P.O.:120] Out: -   Physical Exam: General: Ill appearing  Head: Normocephalic, atraumatic. Moist oral mucosal membranes  Eyes: Anicteric, PERRL  Neck: Supple, trachea midline  Lungs:  Clear to auscultation  Heart: Regular rate and rhythm  Abdomen:  Soft, nontender,   Extremities:  no peripheral edema.  Neurologic: Nonfocal, moving all four extremities  Skin: No lesions  Access: none    Basic Metabolic Panel:  Recent Labs Lab 03/26/17 2028 03/27/17 0556  NA 138 140  K 4.3 4.2  CL 116* 118*  CO2 12* 11*  GLUCOSE 126* 97  BUN 97* 106*  CREATININE 11.51* 11.83*  CALCIUM 9.0 8.5*    Liver Function Tests:  Recent Labs Lab 03/26/17 2028  AST 20  ALT 14*  ALKPHOS 98  BILITOT 0.5  PROT 7.0  ALBUMIN 3.3*    Recent Labs Lab 03/26/17 2028  LIPASE 159*    No results for input(s): AMMONIA in the last 168 hours.  CBC:  Recent Labs Lab 03/26/17 2028 03/27/17 0556  WBC 12.2* 11.4*  HGB 10.4* 9.0*  HCT 32.1* 28.2*  MCV 95.1 94.2  PLT 272 238    Cardiac Enzymes:  Recent Labs Lab 03/26/17 2028 03/27/17 0058 03/27/17 0556 03/27/17 1214  TROPONINI 0.12* 0.08* 0.05* 0.08*    BNP: Invalid input(s): POCBNP  CBG: No results for input(s): GLUCAP in the last 168 hours.  Microbiology: Results for orders placed or performed during the hospital encounter of 03/26/17  C difficile quick scan w PCR reflex     Status: Abnormal   Collection Time: 03/27/17  4:33 AM  Result Value Ref Range Status   C Diff antigen POSITIVE (A) NEGATIVE Final    Comment: CRITICAL RESULT CALLED TO, READ BACK BY AND VERIFIED WITH: ROBERT Martinique ON 03/27/17 AT605 BY TLB    C Diff toxin POSITIVE (A) NEGATIVE Final   C Diff interpretation Toxin producing C. difficile detected.  Final    Coagulation Studies:  Recent Labs  03/27/17 0415  LABPROT 16.8*  INR 1.35    Urinalysis:  Recent Labs  03/27/17 0750  COLORURINE YELLOW*  LABSPEC 1.014  PHURINE 5.0  GLUCOSEU 50*  HGBUR NEGATIVE  BILIRUBINUR NEGATIVE  KETONESUR NEGATIVE  PROTEINUR 100*  NITRITE NEGATIVE  LEUKOCYTESUR LARGE*      Imaging: Ct Abdomen Pelvis Wo Contrast  Result Date: 03/27/2017 CLINICAL DATA:  Acute onset of  diarrhea and generalized abdominal pain. Mucus in the stool. Initial encounter. EXAM: CT ABDOMEN AND PELVIS WITHOUT CONTRAST TECHNIQUE: Multidetector CT imaging of the abdomen and pelvis was performed following the standard protocol without IV contrast. COMPARISON:  CT of the abdomen and pelvis performed 02/15/2017 FINDINGS: Lower chest: Scattered coronary artery calcification is seen. Mild left basilar atelectasis or scarring is noted. Hepatobiliary: The mildly nodular contour of the liver raises question for mild hepatic cirrhosis. The gallbladder is grossly  unremarkable. The common bile duct remains normal in caliber. Pancreas: The pancreas is within normal limits. Spleen: The spleen is unremarkable in appearance. Adrenals/Urinary Tract: The adrenal glands are unremarkable in appearance. Mild bilateral renal atrophy is noted. Nonspecific perinephric stranding is noted bilaterally. There is no evidence of hydronephrosis. No renal or ureteral stones are identified. Stomach/Bowel: The appendix is grossly unremarkable in appearance. There is no evidence for appendicitis. Mild diffuse wall thickening is noted along the entirety of the colon, with vague diffuse soft tissue inflammation. There is no evidence of perforation. Vascular/Lymphatic: The abdominal aorta is unremarkable in appearance. The inferior vena cava is grossly unremarkable. No retroperitoneal lymphadenopathy is seen. No pelvic sidewall lymphadenopathy is identified. Reproductive: The bladder is mildly distended and grossly unremarkable. There is impression on the base of the bladder from the enlarged prostate, which measures 5.5 cm in transverse dimension. Other: No additional soft tissue abnormalities are seen. Musculoskeletal: No acute osseous abnormalities are identified. The visualized musculature is unremarkable in appearance. IMPRESSION: 1. Mild diffuse wall thickening along the entirety of the colon, with vague diffuse soft tissue inflammation. This is concerning for acute infectious or inflammatory colitis. 2. Mildly nodular contour of the liver raises raises question for mild hepatic cirrhosis. 3. Mild bilateral renal atrophy noted. 4. Enlarged prostate noted. 5. Scattered coronary artery calcifications. Electronically Signed   By: Garald Balding M.D.   On: 03/27/2017 02:33     Medications:   .  sodium bicarbonate  infusion 1000 mL 75 mL/hr at 03/27/17 1031   . amLODipine  2.5 mg Oral Daily  . aspirin EC  81 mg Oral Daily  . atorvastatin  20 mg Oral Daily  . baclofen  10 mg Oral TID  .  doxazosin  2 mg Oral QHS  . feeding supplement (GLUCERNA SHAKE)  237 mL Oral TID BM  . ferrous sulfate  325 mg Oral Q breakfast  . hydrALAZINE  50 mg Oral TID  . metoprolol succinate  25 mg Oral Daily  . multivitamin with minerals  1 tablet Oral Daily  . pantoprazole  40 mg Oral Daily  . polyvinyl alcohol  1 drop Both Eyes BID  . sodium bicarbonate  650 mg Oral BID  . sodium chloride flush  3 mL Intravenous Q12H  . tamsulosin  0.4 mg Oral QPC supper  . vancomycin  125 mg Oral Q6H   acetaminophen **OR** acetaminophen, gabapentin, morphine injection, ondansetron (ZOFRAN) IV, ondansetron, promethazine  Assessment/ Plan:  Martin Gilbert is a 76 y.o. Hispanic malewith diabetes mellitus type II, hypertension, hepatic cirrhosis, BPH, gout, diabetic neuropathy  1. Acute renal failure with metabolic acidosis on chronic kidney disease stage V: with proteinuria, metabolic acidosis and hypoalbuminemia.  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 Baseline creatinine of 6.23, GFR of 8 on 02/11/17. Followed by Dr. Juanito Doom, [facility].  - Discussed case with Dr. Juanito Doom who states patient is not interested in dialysis at this time.  - Dialysis and ESRD education provided patient and  family - IV sodium bicarb gtt  2. Hypertension: blood pressure at goal Home regimen of amlodipine, furosemide, hydralazine, and metoprolol. Not on ACE-I/ARB due to advanced renal disease.   3. Diabetes mellitus type II with chronic kidney disease: insulin dependent  4. Anemia of chronic kidney disease: gets outpatient Aranesp. Hemoglobin 9   5. Colitis: failure outpatient therapy for c. Diff colitis. Seems patient stopped taking his antibiotics.    LOS: 0 [place] Yeske 4/12/20182:18 PM

## 2017-03-27 NOTE — Plan of Care (Signed)
Problem: Pain Managment: Goal: General experience of comfort will improve Outcome: Not Progressing Pt c/o ABD pain 9/10.

## 2017-03-27 NOTE — ED Notes (Signed)
Pt states he needs to use the restroom, pt assisted to BR, hat placed to collect stool sample as well as urinal for urine sample.

## 2017-03-27 NOTE — H&P (Signed)
[facility] NAME: Martin Gilbert    MR#:  [id number]  DATE OF BIRTH:  [birth year]  DATE OF ADMISSION:  03/26/2017  PRIMARY CARE PHYSICIAN: [name], MD   REQUESTING/REFERRING PHYSICIAN:   CHIEF COMPLAINT:   Chief Complaint  Patient presents with  . Abdominal Pain  . Diarrhea    HISTORY OF PRESENT ILLNESS: Martin Gilbert  is a 76 y.o. male with a known history of Chronic kidney disease stage IV, type 2 diabetes mellitus, hypertension, hyperlipidemia, benign prostate hypertrophy, C. difficile colitis presented to the emergency room with abdominal discomfort and diarrhea. Patient had loose stool for the last 2 weeks. He has abdominal discomfort which is aching in nature around the umbilicus 6 out of 10 on a scale of 1-10. Patient is Spanish speaking but was accompanied by daughter was able to give information. No complaints of any shortness of breath. Had an episode of chest chest pain which is sharp in nature prior to presentation to the emergency room. The pain was 3 out of 10 on a scale of 1-10. Patient was recently treated for C. difficile colitis at our hospital and discharged on 02/19/2017. His troponin was elevated and creatinine was also elevated and around 11.5 and BUN was 97. Patient was started on IV heparin drip for non-ST elevation MI in the emergency room. He was also given IV ciprofloxacin and IV Flagyl antibiotics in the emergency room after he was worked up with a CT abdomen which showed colitis. No history of any hematemesis, hemoptysis and rectal bleed. Hospitalist service was consulted for further care of the patient.  PAST MEDICAL HISTORY:   Past Medical History:  Diagnosis Date  . Back pain   . BPH (benign prostatic hyperplasia)   . CKD (chronic kidney disease), stage IV ([place])   . Diabetes mellitus without complication ([place])   . HTN (hypertension)   . Hyperlipidemia     PAST  SURGICAL HISTORY: Past Surgical History:  Procedure Laterality Date  . hernia repair      SOCIAL HISTORY:  Social History  Substance Use Topics  . Smoking status: Never Smoker  . Smokeless tobacco: Never Used  . Alcohol use No     Comment: Drank in the past, but has stopped for several years.     FAMILY HISTORY:  Family History  Problem Relation Age of Onset  . Diabetes Neg Hx   . Hypertension Neg Hx   Mother deceased Father deceased  DRUG ALLERGIES: No Active Allergies  REVIEW OF SYSTEMS:   CONSTITUTIONAL: No fever, has weakness.  EYES: No blurred or double vision.  EARS, NOSE, AND THROAT: No tinnitus or ear pain.  RESPIRATORY: No cough, shortness of breath, wheezing or hemoptysis.  CARDIOVASCULAR: No chest pain, orthopnea, edema.  GASTROINTESTINAL: No nausea, vomiting,  has diarrhea and abdominal pain.  GENITOURINARY: No dysuria, hematuria.  ENDOCRINE: No polyuria, nocturia,  HEMATOLOGY: No anemia, easy bruising or bleeding SKIN: No rash or lesion. MUSCULOSKELETAL: No joint pain or arthritis.   NEUROLOGIC: No tingling, numbness, weakness.  PSYCHIATRY: No anxiety or depression.   MEDICATIONS AT HOME:  Prior to Admission medications   Medication Sig Start Date End Date Taking? Authorizing Provider  acetaminophen (TYLENOL) 500 MG tablet Take 500 mg by mouth every 8 (eight) hours as needed.   Yes Historical Provider, MD  allopurinol (ZYLOPRIM) 100 MG tablet Take 100 mg by mouth daily. 10/17/15  Yes Historical Provider, MD  amLODipine (NORVASC) 2.5 MG tablet Take 2.5 mg by mouth daily.   Yes Historical Provider, MD  aspirin 81 MG tablet Take 81 mg by mouth daily.   Yes Historical Provider, MD  atorvastatin (LIPITOR) 20 MG tablet Take 20 mg by mouth daily.   Yes Historical Provider, MD  doxazosin (CARDURA) 2 MG tablet Take 2 mg by mouth at bedtime.    Yes Historical Provider, MD  ferrous sulfate 325 (65 FE) MG tablet Take 325 mg by mouth daily with breakfast. 08/30/15  Yes  Historical Provider, MD  gabapentin (NEURONTIN) 100 MG capsule Take 1 capsule (100 mg total) by mouth every 8 (eight) hours as needed (tingling or numbness in feet). 02/02/17  Yes Theodoro Grist, MD  glipiZIDE (GLUCOTROL) 5 MG tablet Take 5 mg by mouth daily. 12/27/16  Yes Historical Provider, MD  hydrALAZINE (APRESOLINE) 50 MG tablet Take 50 mg by mouth 3 (three) times daily.   Yes Historical Provider, MD  metoprolol succinate (TOPROL-XL) 25 MG 24 hr tablet Take 25 mg by mouth daily.   Yes Historical Provider, MD  Multiple Vitamin (MULTI-VITAMINS) TABS Take 1 tablet by mouth daily.   Yes Historical Provider, MD  ondansetron (ZOFRAN) 4 MG tablet Take 1 tablet (4 mg total) by mouth every 6 (six) hours as needed for nausea. 02/02/17  Yes Theodoro Grist, MD  pantoprazole (PROTONIX) 40 MG tablet Take 1 tablet (40 mg total) by mouth daily. 02/03/17  Yes Theodoro Grist, MD  sodium bicarbonate 650 MG tablet Take 650 mg by mouth 2 (two) times daily.    Yes Historical Provider, MD  tamsulosin (FLOMAX) 0.4 MG CAPS capsule Take 1 capsule (0.4 mg total) by mouth daily after supper. 02/19/17  Yes Epifanio Lesches, MD  Vitamin D, Cholecalciferol, 1000 UNITS TABS Take by mouth.   Yes Historical Provider, MD  baclofen (LIORESAL) 10 MG tablet Take 1 tablet (10 mg total) by mouth 3 (three) times daily. Patient not taking: Reported on 01/30/2017 05/27/16   [name], PA-C  carbamide peroxide (DEBROX) 6.5 % otic solution Place 5 drops into the right ear 2 (two) times daily. Patient not taking: Reported on 01/30/2017 05/27/16 05/27/17  [name], PA-C  feeding supplement, GLUCERNA SHAKE, (GLUCERNA SHAKE) LIQD Take 237 mLs by mouth 3 (three) times daily between meals. 02/02/17   Theodoro Grist, MD  Polyvinyl Alcohol-Povidone (REFRESH OP) Place 1 drop into both eyes 2 (two) times daily.    Historical Provider, MD  vancomycin (VANCOCIN) 50 mg/mL oral solution Take 2.5 mLs (125 mg total) by mouth every 6 (six)  hours. Patient not taking: Reported on 03/27/2017 02/19/17   Epifanio Lesches, MD      PHYSICAL EXAMINATION:   VITAL SIGNS: Blood pressure 136/66, pulse 93, temperature 98.2 F (36.8 C), temperature source Oral, resp. rate 17, height 5\' 8"  (1.727 m), weight 79.4 kg (175 lb), SpO2 98 %.  GENERAL:  76 y.o.-year-old patient lying in the bed with no acute distress.  EYES: Pupils equal, round, reactive to light and accommodation. No scleral icterus. Extraocular muscles intact.  HEENT: Head atraumatic, normocephalic. Oropharynx dry and nasopharynx clear.  NECK:  Supple, no jugular venous distention. No thyroid enlargement, no tenderness.  LUNGS: Normal breath sounds bilaterally, no wheezing, rales,rhonchi or crepitation. No use of accessory muscles of respiration.  CARDIOVASCULAR: S1, S2 normal. No murmurs, rubs, or gallops.  ABDOMEN: Soft, tenderness noted around umbilicus, nondistended. Bowel sounds present. No organomegaly or mass.  EXTREMITIES: No pedal edema, cyanosis, or clubbing.  NEUROLOGIC: Cranial nerves II through XII are intact. Muscle strength 5/5 in all extremities. Sensation intact. Gait not checked.  PSYCHIATRIC: The patient is alert and oriented x 3.  SKIN: No obvious rash, lesion, or ulcer.   LABORATORY PANEL:   CBC  Recent Labs Lab 03/26/17 2028  WBC 12.2*  HGB 10.4*  HCT 32.1*  PLT 272  MCV 95.1  MCH 30.9  MCHC 32.5  RDW 15.0*   ------------------------------------------------------------------------------------------------------------------  Chemistries   Recent Labs Lab 03/26/17 2028  NA 138  K 4.3  CL 116*  CO2 12*  GLUCOSE 126*  BUN 97*  CREATININE 11.51*  CALCIUM 9.0  AST 20  ALT 14*  ALKPHOS 98  BILITOT 0.5   ------------------------------------------------------------------------------------------------------------------ estimated creatinine clearance is 5.3 mL/min (A) (by C-G formula based on SCr of 11.51 mg/dL  (H)). ------------------------------------------------------------------------------------------------------------------ No results for input(s): TSH, T4TOTAL, T3FREE, THYROIDAB in the last 72 hours.  Invalid input(s): FREET3   Coagulation profile No results for input(s): INR, PROTIME in the last 168 hours. ------------------------------------------------------------------------------------------------------------------- No results for input(s): DDIMER in the last 72 hours. -------------------------------------------------------------------------------------------------------------------  Cardiac Enzymes  Recent Labs Lab 03/26/17 2028 03/27/17 0058  TROPONINI 0.12* 0.08*   ------------------------------------------------------------------------------------------------------------------ Invalid input(s): POCBNP  ---------------------------------------------------------------------------------------------------------------  Urinalysis    Component Value Date/Time   COLORURINE YELLOW (A) 02/17/2017 1711   APPEARANCEUR HAZY (A) 02/17/2017 1711   APPEARANCEUR Turbid 08/29/2013 2049   LABSPEC 1.014 02/17/2017 1711   LABSPEC 1.015 08/29/2013 2049   PHURINE 5.0 02/17/2017 1711   GLUCOSEU 50 (A) 02/17/2017 1711   GLUCOSEU 50 mg/dL 08/29/2013 2049   HGBUR NEGATIVE 02/17/2017 1711   BILIRUBINUR NEGATIVE 02/17/2017 1711   BILIRUBINUR Negative 08/29/2013 2049   KETONESUR NEGATIVE 02/17/2017 1711   PROTEINUR >=300 (A) 02/17/2017 1711   NITRITE NEGATIVE 02/17/2017 1711   LEUKOCYTESUR TRACE (A) 02/17/2017 1711   LEUKOCYTESUR 3+ 08/29/2013 2049     RADIOLOGY: Ct Abdomen Pelvis Wo Contrast  Result Date: 03/27/2017 CLINICAL DATA:  Acute onset of diarrhea and generalized abdominal pain. Mucus in the stool. Initial encounter. EXAM: CT ABDOMEN AND PELVIS WITHOUT CONTRAST TECHNIQUE: Multidetector CT imaging of the abdomen and pelvis was performed following the standard protocol without IV  contrast. COMPARISON:  CT of the abdomen and pelvis performed 02/15/2017 FINDINGS: Lower chest: Scattered coronary artery calcification is seen. Mild left basilar atelectasis or scarring is noted. Hepatobiliary: The mildly nodular contour of the liver raises question for mild hepatic cirrhosis. The gallbladder is grossly unremarkable. The common bile duct remains normal in caliber. Pancreas: The pancreas is within normal limits. Spleen: The spleen is unremarkable in appearance. Adrenals/Urinary Tract: The adrenal glands are unremarkable in appearance. Mild bilateral renal atrophy is noted. Nonspecific perinephric stranding is noted bilaterally. There is no evidence of hydronephrosis. No renal or ureteral stones are identified. Stomach/Bowel: The appendix is grossly unremarkable in appearance. There is no evidence for appendicitis. Mild diffuse wall thickening is noted along the entirety of the colon, with vague diffuse soft tissue inflammation. There is no evidence of perforation. Vascular/Lymphatic: The abdominal aorta is unremarkable in appearance. The inferior vena cava is grossly unremarkable. No retroperitoneal lymphadenopathy is seen. No pelvic sidewall lymphadenopathy is identified. Reproductive: The bladder is mildly distended and grossly unremarkable. There is impression on the base of the bladder from the enlarged prostate, which measures 5.5 cm in transverse dimension. Other: No additional soft tissue abnormalities are seen. Musculoskeletal: No acute osseous abnormalities are identified. The visualized musculature is unremarkable in appearance. IMPRESSION: 1. Mild diffuse wall  thickening along the entirety of the colon, with vague diffuse soft tissue inflammation. This is concerning for acute infectious or inflammatory colitis. 2. Mildly nodular contour of the liver raises raises question for mild hepatic cirrhosis. 3. Mild bilateral renal atrophy noted. 4. Enlarged prostate noted. 5. Scattered coronary  artery calcifications. Electronically Signed   By: Garald Balding M.D.   On: 03/27/2017 02:33    EKG: Orders placed or performed during the hospital encounter of 03/26/17  . ED EKG  . ED EKG  . EKG 12-Lead  . EKG 12-Lead    IMPRESSION AND PLAN: 76 year old male patient with history of chronic kidney disease stage IV, diabetes mellitus, hypertension, prostate hypertrophy, history of C. difficile colitis presented to the emergency room with abdominal pain and diarrhea. Patient has elevated troponin. Admitting diagnosis 1. Acute colitis 2. Non-ST elevation MI 3. Acute on chronic renal failure 4. Dehydration 5. Diabetes mellitus 6. Hypertension Treatment plan Admit patient to medical floor with cardiac monitoring Cycle serial troponin check for ischemia IV heparin drip for anticoagulation Check stool for C. difficile toxin Start patient on oral vancomycin Monitor electrolytes Clear liquid diet Monitor hemoglobin and hematocrit Cardiology and nephrology consultation Supportive care  All the records are reviewed and case discussed with ED provider. Management plans discussed with the patient, family and they are in agreement.  CODE STATUS:FULL CODE Surrogate Decision Maker : Daughter Code Status History    Date Active Date Inactive Code Status Order ID Comments User Context   02/15/2017  9:11 AM 02/19/2017  6:04 PM Full Code [id number]  Saundra Shelling, MD Inpatient   01/31/2017  2:36 AM 02/02/2017  8:22 PM Full Code [id number]  Saundra Shelling, MD ED       TOTAL TIME TAKING CARE OF THIS PATIENT: 50 minutes.    Saundra Shelling M.D on 03/27/2017 at 3:30 AM  Between 7am to 6pm - Pager - [phone]  After 6pm go to [email or web address] - password [email or web address]  Office  [phone]  CC: Primary care physician; [name], MD

## 2017-03-27 NOTE — ED Notes (Signed)
Pt instructed urine sample is needed, pt states he is unable to urinate at this time. Pt has specimen cup with him incase he is able to urinate.

## 2017-03-27 NOTE — Progress Notes (Signed)
ANTICOAGULATION CONSULT NOTE - Initial Consult  Pharmacy Consult for heparin drip Indication: chest pain/ACS  No Active Allergies  Patient Measurements: Height: 5\' 8"  (172.7 cm) Weight: 175 lb (79.4 kg) IBW/kg (Calculated) : 68.4 Heparin Dosing Weight: 79 kg  Vital Signs: Temp: 98.2 F (36.8 C) (04/11 2006) Temp Source: Oral (04/11 2006) BP: 136/66 (04/12 0300) Pulse Rate: 93 (04/12 0300)  Labs:  Recent Labs  03/26/17 2028  HGB 10.4*  HCT 32.1*  PLT 272  CREATININE 11.51*  TROPONINI 0.12*    Estimated Creatinine Clearance: 5.3 mL/min (A) (by C-G formula based on SCr of 11.51 mg/dL (H)).   Medical History: Past Medical History:  Diagnosis Date  . Back pain   . BPH (benign prostatic hyperplasia)   . CKD (chronic kidney disease), stage IV ([place])   . Diabetes mellitus without complication ([place])   . HTN (hypertension)   . Hyperlipidemia     Medications:  Scheduled:  . heparin  4,000 Units Intravenous Once  . vancomycin  125 mg Oral Q6H    Assessment: Patient admitted w/ CP trops 0.12 Being started on heparin drip  Goal of Therapy:  Heparin level 0.3-0.7 units/ml Monitor platelets by anticoagulation protocol: Yes   Plan:  Give 4000 units bolus x 1  Will start heparin drip @ 1000 units/hr (12 unit/kg/hr). Will check HL @ 1200 8 hours after start of drip. Baseline labs ordered.  Thank you for this consult.  Tobie Lords, PharmD, BCPS Clinical Pharmacist 03/27/2017

## 2017-03-27 NOTE — Progress Notes (Signed)
Admitted this morning for abdominal pain, diarrhea, found to have C. difficile colitis.   spoke thru  [employer]. Has been having abdominal pain for 10 days and he thought he would get better but pain didn't get better; became to hospital today. Never had chest pain. No shortness of breath. Has been having diarrhea. He was admitted in March for C. difficile colitis, this time stool culture showed C. difficile. Has been having nausea multiple times this morning, received Zofran, after getting His Nausea Little Better. Having Abdominal Pain Mainly in the Epigastric and Left Upper and Lower Quadrant Area  7by 10 Severity.  Examination: Alert, awake, oriented. Cardiovascular: S1, S2 regular. Lungs the: Clear to auscultation, no wheeze, no rales. GI: Abdomen tenderness in the epigastric and left lower quadrant area, bowel sounds present. No organomegaly. Lab data: White count up at 11.4, BUN 106, creatinine 11.83, bicarbonate 11. UA also showed cloudy urine, many WBC.   #1. Recurrent C. difficile colitis: One episode in March and this is secondary episode, patient to be started on by mouth vancomycin, get gastroenterology consult for consulting fecal transplant. #2 acute on chronic renal failure with metabolic acidosis likely secondary to C. difficile colitis, continue bicarbonate drip, appreciate nephrology consult, patient to be monitored on telemetry, has no hyperkalemia or shortness of breath at this time. Patient follows up at [facility] nephrology. Nausea, abdominal pain due to C. difficile colitis. It is showed a diffuse colitis of entire colon. Continue pain medicines, nausea medicines, treat cdiff. D/w him and wife  With help of spanish translator,  3.DMII(;npo,hold oral diabtes meds Time spent;35 min

## 2017-03-27 NOTE — Consult Note (Signed)
Lucilla Lame, MD [facility]  [address] [place], [place] [address] Phone: [phone] Fax : [phone]  Consultation  Referring Provider:     Dr. Vianne Bulls Primary Care Physician:  [name], MD Primary Gastroenterologist:  None.          Reason for Consultation:     C. Difficile colitis  Date of Admission:  03/26/2017 Date of Consultation:  03/27/2017         HPI:   Martin Gilbert is a 76 y.o. male Who is admitted with C. Difficile colitis.  The patient was seen at the beginning of March in this hospital for diarrhea and abdominal pain.  The patient had been treated for prostatitis with antibiotics and developed C. Difficile colitis.  The patient was then discharged with vancomycin 125 mg to be taken 4 times a day.  The patient does not speak any English and the information is gotten by a family member.  The patient states that he was not sleeping well and thought it might be a medication so he stopped it with only one week of the medication taken.  The patient then states that approximately 2 weeks ago he started to have diarrhea again and was now admitted with abdominal pain diarrhea and found to have C. Difficile positive.  Past Medical History:  Diagnosis Date  . Back pain   . BPH (benign prostatic hyperplasia)   . CKD (chronic kidney disease), stage IV ([place])   . Diabetes mellitus without complication ([place])   . HTN (hypertension)   . Hyperlipidemia     Past Surgical History:  Procedure Laterality Date  . hernia repair      Prior to Admission medications   Medication Sig Start Date End Date Taking? Authorizing Provider  acetaminophen (TYLENOL) 500 MG tablet Take 500 mg by mouth every 8 (eight) hours as needed.   Yes Historical Provider, MD  allopurinol (ZYLOPRIM) 100 MG tablet Take 100 mg by mouth daily. 10/17/15  Yes Historical Provider, MD  amLODipine (NORVASC) 2.5 MG tablet Take 2.5 mg by mouth daily.   Yes Historical Provider, MD  aspirin 81 MG  tablet Take 81 mg by mouth daily.   Yes Historical Provider, MD  atorvastatin (LIPITOR) 20 MG tablet Take 20 mg by mouth daily.   Yes Historical Provider, MD  doxazosin (CARDURA) 2 MG tablet Take 2 mg by mouth at bedtime.    Yes Historical Provider, MD  ferrous sulfate 325 (65 FE) MG tablet Take 325 mg by mouth daily with breakfast. 08/30/15  Yes Historical Provider, MD  gabapentin (NEURONTIN) 100 MG capsule Take 1 capsule (100 mg total) by mouth every 8 (eight) hours as needed (tingling or numbness in feet). 02/02/17  Yes Theodoro Grist, MD  glipiZIDE (GLUCOTROL) 5 MG tablet Take 5 mg by mouth daily. 12/27/16  Yes Historical Provider, MD  hydrALAZINE (APRESOLINE) 50 MG tablet Take 50 mg by mouth 3 (three) times daily.   Yes Historical Provider, MD  metoprolol succinate (TOPROL-XL) 25 MG 24 hr tablet Take 25 mg by mouth daily.   Yes Historical Provider, MD  Multiple Vitamin (MULTI-VITAMINS) TABS Take 1 tablet by mouth daily.   Yes Historical Provider, MD  ondansetron (ZOFRAN) 4 MG tablet Take 1 tablet (4 mg total) by mouth every 6 (six) hours as needed for nausea. 02/02/17  Yes Theodoro Grist, MD  pantoprazole (PROTONIX) 40 MG tablet Take 1 tablet (40 mg total) by mouth daily. 02/03/17  Yes Theodoro Grist, MD  sodium  bicarbonate 650 MG tablet Take 650 mg by mouth 2 (two) times daily.    Yes Historical Provider, MD  tamsulosin (FLOMAX) 0.4 MG CAPS capsule Take 1 capsule (0.4 mg total) by mouth daily after supper. 02/19/17  Yes Epifanio Lesches, MD  Vitamin D, Cholecalciferol, 1000 UNITS TABS Take by mouth.   Yes Historical Provider, MD  baclofen (LIORESAL) 10 MG tablet Take 1 tablet (10 mg total) by mouth 3 (three) times daily. Patient not taking: Reported on 01/30/2017 05/27/16   [name], PA-C  carbamide peroxide (DEBROX) 6.5 % otic solution Place 5 drops into the right ear 2 (two) times daily. Patient not taking: Reported on 01/30/2017 05/27/16 05/27/17  [name], PA-C  feeding supplement,  GLUCERNA SHAKE, (GLUCERNA SHAKE) LIQD Take 237 mLs by mouth 3 (three) times daily between meals. 02/02/17   Theodoro Grist, MD  Polyvinyl Alcohol-Povidone (REFRESH OP) Place 1 drop into both eyes 2 (two) times daily.    Historical Provider, MD  vancomycin (VANCOCIN) 50 mg/mL oral solution Take 2.5 mLs (125 mg total) by mouth every 6 (six) hours. Patient not taking: Reported on 03/27/2017 02/19/17   Epifanio Lesches, MD    Family History  Problem Relation Age of Onset  . Diabetes Neg Hx   . Hypertension Neg Hx      Social History  Substance Use Topics  . Smoking status: Never Smoker  . Smokeless tobacco: Never Used  . Alcohol use No     Comment: Drank in the past, but has stopped for several years.     Allergies as of 03/26/2017  . (No Active Allergies)    Review of Systems:    All systems reviewed and negative except where noted in HPI.   Physical Exam:  Vital signs in last 24 hours: Temp:  [97.5 F (36.4 C)-98.8 F (37.1 C)] 97.5 F (36.4 C) (04/12 1055) Pulse Rate:  [88-96] 93 (04/12 1055) Resp:  [17-18] 18 (04/12 1055) BP: (130-153)/(39-66) 140/39 (04/12 1055) SpO2:  [95 %-99 %] 95 % (04/12 1055) Weight:  [175 lb (79.4 kg)] 175 lb (79.4 kg) (04/11 2006) Last BM Date: 03/27/17 General:   Pleasant, cooperative in NAD Head:  Normocephalic and atraumatic. Eyes:   No icterus.   Conjunctiva pink. PERRLA. Ears:  Normal auditory acuity. Neck:  Supple; no masses or thyroidomegaly Lungs: Respirations even and unlabored. Lungs clear to auscultation bilaterally.   No wheezes, crackles, or rhonchi.  Heart:  Regular rate and rhythm;  Without murmur, clicks, rubs or gallops Abdomen:  Soft, Distended, Diffusely tender. Normal bowel sounds. No appreciable masses or hepatomegaly.  No rebound or guarding.  Rectal:  Not performed. Msk:  Symmetrical without gross deformities.    Extremities:  Without edema, cyanosis or clubbing. Neurologic:  Alert and oriented x3;  grossly normal  neurologically. Skin:  Intact without significant lesions or rashes. Cervical Nodes:  No significant cervical adenopathy. Psych:  Alert and cooperative. Normal affect.  LAB RESULTS:  Recent Labs  03/26/17 2028 03/27/17 0556  WBC 12.2* 11.4*  HGB 10.4* 9.0*  HCT 32.1* 28.2*  PLT 272 238   BMET  Recent Labs  03/26/17 2028 03/27/17 0556  NA 138 140  K 4.3 4.2  CL 116* 118*  CO2 12* 11*  GLUCOSE 126* 97  BUN 97* 106*  CREATININE 11.51* 11.83*  CALCIUM 9.0 8.5*   LFT  Recent Labs  03/26/17 2028  PROT 7.0  ALBUMIN 3.3*  AST 20  ALT 14*  ALKPHOS 98  BILITOT 0.5   PT/INR  Recent Labs  03/27/17 0415  LABPROT 16.8*  INR 1.35    STUDIES: Ct Abdomen Pelvis Wo Contrast  Result Date: 03/27/2017 CLINICAL DATA:  Acute onset of diarrhea and generalized abdominal pain. Mucus in the stool. Initial encounter. EXAM: CT ABDOMEN AND PELVIS WITHOUT CONTRAST TECHNIQUE: Multidetector CT imaging of the abdomen and pelvis was performed following the standard protocol without IV contrast. COMPARISON:  CT of the abdomen and pelvis performed 02/15/2017 FINDINGS: Lower chest: Scattered coronary artery calcification is seen. Mild left basilar atelectasis or scarring is noted. Hepatobiliary: The mildly nodular contour of the liver raises question for mild hepatic cirrhosis. The gallbladder is grossly unremarkable. The common bile duct remains normal in caliber. Pancreas: The pancreas is within normal limits. Spleen: The spleen is unremarkable in appearance. Adrenals/Urinary Tract: The adrenal glands are unremarkable in appearance. Mild bilateral renal atrophy is noted. Nonspecific perinephric stranding is noted bilaterally. There is no evidence of hydronephrosis. No renal or ureteral stones are identified. Stomach/Bowel: The appendix is grossly unremarkable in appearance. There is no evidence for appendicitis. Mild diffuse wall thickening is noted along the entirety of the colon, with vague  diffuse soft tissue inflammation. There is no evidence of perforation. Vascular/Lymphatic: The abdominal aorta is unremarkable in appearance. The inferior vena cava is grossly unremarkable. No retroperitoneal lymphadenopathy is seen. No pelvic sidewall lymphadenopathy is identified. Reproductive: The bladder is mildly distended and grossly unremarkable. There is impression on the base of the bladder from the enlarged prostate, which measures 5.5 cm in transverse dimension. Other: No additional soft tissue abnormalities are seen. Musculoskeletal: No acute osseous abnormalities are identified. The visualized musculature is unremarkable in appearance. IMPRESSION: 1. Mild diffuse wall thickening along the entirety of the colon, with vague diffuse soft tissue inflammation. This is concerning for acute infectious or inflammatory colitis. 2. Mildly nodular contour of the liver raises raises question for mild hepatic cirrhosis. 3. Mild bilateral renal atrophy noted. 4. Enlarged prostate noted. 5. Scattered coronary artery calcifications. Electronically Signed   By: Garald Balding M.D.   On: 03/27/2017 02:33      Impression / Plan:   Martin Gilbert is a 76 y.o. y/o male with His admitted last month with C. Difficile colitis and sent home on vancomycin.  The patient did not finish the course of vancomycin because he was feeling better.  The patient is now back with the reoccurrence of his C. Difficile colitis.  By the new 2018 guidelines the patient should be treated as a first reoccurrence with a prolonged tapered and pulsed Vancomycin  (eg, 125 mg 4 times per day for 10-14 days, 2 times per day for a week, once per day for a week, and then every 2 or 3 days for 2-8 weeks). If this does not eradicate the patient C. Difficile then the patient may need to be treated with fidaxomicin versus a stool transplant.  Nothing further to do from a GI point of view unless this treatment does not eradicate his C. Difficile.   The patient has been told the importance of compliance with this antibiotic since he was not compliant with the last treatment.   Thank you for involving me in the care of this patient.      LOS: 0 days   Lucilla Lame, MD  03/27/2017, 6:20 PM   Note: This dictation was prepared with Dragon dictation along with smaller phrase technology. Any transcriptional errors that result from this process are unintentional.

## 2017-03-27 NOTE — Progress Notes (Signed)
Initial Nutrition Assessment  DOCUMENTATION CODES:   Severe malnutrition in context of acute illness/injury  INTERVENTION:  1. Monitor for diet advancement 2. Encouraged PO intake as appropriate with aid of translator 3. Continue Glucerna Shake po TID, each supplement provides 220 kcal and 10 grams of protein when appropriate   NUTRITION DIAGNOSIS:   Malnutrition related to acute illness as evidenced by moderate depletion of body fat, moderate depletions of muscle mass, energy intake < or equal to 50% for > or equal to 5 days.  GOAL:   Patient will meet greater than or equal to 90% of their needs  MONITOR:   I & O's, Labs, Diet advancement, Skin  REASON FOR ASSESSMENT:   Malnutrition Screening Tool    ASSESSMENT:   Martin Gilbert  is a 76 y.o. male with a known history of Chronic kidney disease stage IV, type 2 diabetes mellitus, hypertension, hyperlipidemia, benign prostate hypertrophy, C. difficile colitis presented to the emergency room with abdominal discomfort and diarrhea  Spoke with patient at bedside via translator He has eaten nothing for the past 2 weeks due to nausea/vomiting/pain Prior to this he was eating 2 meals per day with beans, eggs, fish, broth and 2-3 tortillas.  Family states his weight is now 183# per chart weight is 175# Still experiencing severe nausea, had recently vomited prior to our visit. 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 Consult as necessary. Patient not interested in dialysis at this time per Nephrology.  Nutrition-Focused physical exam completed. Findings are moderate fat depletion, moderate muscle depletion, and no edema.   Due to PO intake <50% for > 5 days, and moderate muscle wasting/fat depletions patient meets criteria for severe malnutrition in context of acute illness  Labs and medications reviewed: Iron, MVI w/ Minerals NaHCO3 w/ D5 @ 4mL/hr --> 306  calories  Diet Order:  Diet NPO time specified Except for: Sips with Meds  Skin:  Reviewed, no issues  Last BM:  03/27/2017  Height:   Ht Readings from Last 1 Encounters:  03/26/17 5\' 8"  (1.727 m)    Weight:   Wt Readings from Last 1 Encounters:  03/26/17 175 lb (79.4 kg)    Ideal Body Weight:  70 kg  BMI:  Body mass index is 26.61 kg/m.  Estimated Nutritional Needs:   Kcal:  1600-2000 calories  Protein:  80-95 grams  Fluid:  >/= 1.6L  EDUCATION NEEDS:   Education needs no appropriate at this time  Satira Anis. Octavie Westerhold, MS, RD LDN Inpatient Clinical Dietitian Pager [phone]

## 2017-03-27 NOTE — Progress Notes (Signed)
*  PRELIMINARY RESULTS* Echocardiogram 2D Echocardiogram has been performed.  Martin Gilbert 03/27/2017, 10:52 AM

## 2017-03-27 NOTE — ED Notes (Signed)
Pt unable to provide urine sample, pt did have a very small liquid BM which was sent to lab.

## 2017-03-28 ENCOUNTER — Inpatient Hospital Stay: Payer: Medicare Other

## 2017-03-28 ENCOUNTER — Encounter
Admission: EM | Disposition: A | Discharge status: Skilled Nursing Facility | Payer: Self-pay | Source: Home / Self Care | Attending: Internal Medicine

## 2017-03-28 DIAGNOSIS — N189 Chronic kidney disease, unspecified: Secondary | ICD-10-CM

## 2017-03-28 DIAGNOSIS — R109 Unspecified abdominal pain: Secondary | ICD-10-CM

## 2017-03-28 LAB — COMPREHENSIVE METABOLIC PANEL WITH GFR
ALT: 9 U/L — ABNORMAL LOW (ref 17–63)
AST: 21 U/L (ref 15–41)
Albumin: 2.3 g/dL — ABNORMAL LOW (ref 3.5–5.0)
Alkaline Phosphatase: 74 U/L (ref 38–126)
Anion gap: 12 (ref 5–15)
BUN: 114 mg/dL — ABNORMAL HIGH (ref 6–20)
CO2: 18 mmol/L — ABNORMAL LOW (ref 22–32)
Calcium: 7.8 mg/dL — ABNORMAL LOW (ref 8.9–10.3)
Chloride: 110 mmol/L (ref 101–111)
Creatinine, Ser: 13.21 mg/dL — ABNORMAL HIGH (ref 0.61–1.24)
GFR calc Af Amer: 4 mL/min — ABNORMAL LOW
GFR calc non Af Amer: 3 mL/min — ABNORMAL LOW
Glucose, Bld: 99 mg/dL (ref 65–99)
Potassium: 3.5 mmol/L (ref 3.5–5.1)
Sodium: 140 mmol/L (ref 135–145)
Total Bilirubin: 0.6 mg/dL (ref 0.3–1.2)
Total Protein: 5 g/dL — ABNORMAL LOW (ref 6.5–8.1)

## 2017-03-28 LAB — CBC
HEMATOCRIT: 28.2 % — AB (ref 40.0–52.0)
HEMATOCRIT: 26.8 % — AB (ref 40.0–52.0)
HEMOGLOBIN: 9.1 g/dL — AB (ref 13.0–18.0)
HEMOGLOBIN: 8.9 g/dL — AB (ref 13.0–18.0)
MCH: 30.4 pg (ref 26.0–34.0)
MCH: 31.1 pg (ref 26.0–34.0)
MCHC: 32.3 g/dL (ref 32.0–36.0)
MCHC: 33.3 g/dL (ref 32.0–36.0)
MCV: 94.3 fL (ref 80.0–100.0)
MCV: 93.2 fL (ref 80.0–100.0)
PLATELETS: 221 10*3/uL (ref 150–440)
Platelets: 226 10*3/uL (ref 150–440)
RBC: 2.99 MIL/uL — ABNORMAL LOW (ref 4.40–5.90)
RBC: 2.87 MIL/uL — AB (ref 4.40–5.90)
RDW: 15 % — AB (ref 11.5–14.5)
RDW: 14.9 % — ABNORMAL HIGH (ref 11.5–14.5)
WBC: 13.9 10*3/uL — ABNORMAL HIGH (ref 3.8–10.6)
WBC: 12.9 10*3/uL — AB (ref 3.8–10.6)

## 2017-03-28 LAB — GLUCOSE, CAPILLARY: Glucose-Capillary: 114 mg/dL — ABNORMAL HIGH (ref 65–99)

## 2017-03-28 LAB — PHOSPHORUS: Phosphorus: 5.7 mg/dL — ABNORMAL HIGH (ref 2.5–4.6)

## 2017-03-28 SURGERY — DIALYSIS/PERMA CATHETER INSERTION
Anesthesia: LOCAL

## 2017-03-28 MED ORDER — ALBUMIN HUMAN 25 % IV SOLN
12.5000 g | Freq: Once | INTRAVENOUS | Status: AC
  Administered 2017-03-28: 12.5 g via INTRAVENOUS
  Filled 2017-03-28: qty 50

## 2017-03-28 MED ORDER — SODIUM CHLORIDE 0.9 % IV SOLN: INTRAVENOUS | Status: DC

## 2017-03-28 NOTE — Progress Notes (Signed)
Called vascular lab regarding patient and plan for dialysis, dr. dew in procedure. Message left patient has no orders for dialysis catheter access.

## 2017-03-28 NOTE — Consult Note (Signed)
[facility] Consult Note  MRN : [id number]  Martin Gilbert is a 76 y.o. (04-11-1941) male who presents with chief complaint of  Chief Complaint  Patient presents with  . Abdominal Pain  . Diarrhea   History of Present Illness:  Martin Gilbert  is a 76 year old male with a known history of chronic kidney disease stage IV, type 2 diabetes mellitus, hypertension, hyperlipidemia, benign prostate hypertrophy, C. difficile colitis who presented to the emergency room with abdominal discomfort and diarrhea. He was admitted to the hospitalist service for care. He was found to have acute on chronic renal failure with metabolic acidosis due to C.Diff. Patient was unsure if he wanted dialysis, however agreed to do trial of dialysis. Consulted by nephrology for placement of temporary dialysis catheter for hemodialysis.   At the time of placement, patient was lethargic. Permission was obtained by family at bedside.   Current Facility-Administered Medications  Medication Dose Route Frequency Provider Last Rate Last Dose  . 0.9 %  sodium chloride infusion   Intravenous Continuous Algernon Huxley, MD      . acetaminophen (TYLENOL) tablet 650 mg  650 mg Oral Q6H PRN Saundra Shelling, MD       Or  . acetaminophen (TYLENOL) suppository 650 mg  650 mg Rectal Q6H PRN Pavan Pyreddy, MD      . amLODipine (NORVASC) tablet 2.5 mg  2.5 mg Oral Daily Saundra Shelling, MD   Stopped at 03/27/17 1000  . aspirin EC tablet 81 mg  81 mg Oral Daily Pavan Pyreddy, MD   81 mg at 03/28/17 1047  . atorvastatin (LIPITOR) tablet 20 mg  20 mg Oral Daily Saundra Shelling, MD   20 mg at 03/27/17 1837  . baclofen (LIORESAL) tablet 10 mg  10 mg Oral TID Saundra Shelling, MD   10 mg at 03/28/17 1047  . doxazosin (CARDURA) tablet 2 mg  2 mg Oral QHS Saundra Shelling, MD   2 mg at 03/27/17 2250  . feeding supplement (GLUCERNA SHAKE) (GLUCERNA SHAKE) liquid 237 mL  237 mL Oral TID BM Pavan Pyreddy, MD      .  ferrous sulfate tablet 325 mg  325 mg Oral Q breakfast Saundra Shelling, MD   325 mg at 03/28/17 0827  . gabapentin (NEURONTIN) capsule 100 mg  100 mg Oral Q8H PRN Pavan Pyreddy, MD      . hydrALAZINE (APRESOLINE) tablet 50 mg  50 mg Oral TID Saundra Shelling, MD   Stopped at 03/28/17 1016  . metoprolol succinate (TOPROL-XL) 24 hr tablet 25 mg  25 mg Oral Daily Saundra Shelling, MD   Stopped at 03/27/17 1000  . multivitamin with minerals tablet 1 tablet  1 tablet Oral Daily Saundra Shelling, MD   1 tablet at 03/28/17 1047  . ondansetron (ZOFRAN) injection 4 mg  4 mg Intravenous Q6H PRN Epifanio Lesches, MD   4 mg at 03/27/17 1541  . ondansetron (ZOFRAN) tablet 4 mg  4 mg Oral Q6H PRN Pavan Pyreddy, MD      . pantoprazole (PROTONIX) EC tablet 40 mg  40 mg Oral Daily Pavan Pyreddy, MD   40 mg at 03/28/17 1048  . polyvinyl alcohol (LIQUIFILM TEARS) 1.4 % ophthalmic solution 1 drop  1 drop Both Eyes BID Epifanio Lesches, MD   1 drop at 03/28/17 1048  . promethazine (PHENERGAN) injection 12.5 mg  12.5 mg Intravenous Q6H PRN Epifanio Lesches, MD   12.5 mg at 03/27/17 1855  .  sodium bicarbonate 150 mEq in dextrose 5 % 1,000 mL infusion   Intravenous Continuous Lavonia Dana, MD 75 mL/hr at 03/28/17 1149    . sodium bicarbonate tablet 650 mg  650 mg Oral BID Saundra Shelling, MD   650 mg at 03/28/17 1048  . sodium chloride flush (NS) 0.9 % injection 3 mL  3 mL Intravenous Q12H Pavan Pyreddy, MD   3 mL at 03/28/17 1049  . tamsulosin (FLOMAX) capsule 0.4 mg  0.4 mg Oral QPC supper Saundra Shelling, MD   0.4 mg at 03/27/17 1837  . vancomycin (VANCOCIN) 50 mg/mL oral solution 125 mg  125 mg Oral Q6H Saundra Shelling, MD   125 mg at 03/28/17 1149   Past Medical History:  Diagnosis Date  . Back pain   . BPH (benign prostatic hyperplasia)   . CKD (chronic kidney disease), stage IV ([place])   . Diabetes mellitus without complication ([place])   . HTN (hypertension)   . Hyperlipidemia    Past Surgical History:  Procedure  Laterality Date  . hernia repair     Social History Social History  Substance Use Topics  . Smoking status: Never Smoker  . Smokeless tobacco: Never Used  . Alcohol use No     Comment: Drank in the past, but has stopped for several years.    Family History Family History  Problem Relation Age of Onset  . Diabetes Neg Hx   . Hypertension Neg Hx   Unable to obtain as patient was minimal responsive.   No Known Allergies   REVIEW OF SYSTEMS (Negative unless checked)  **Gained from previous notes**   Constitutional: [] Weight loss  [] Fever  [] Chills Cardiac: [] Chest pain   [] Chest pressure   [] Palpitations   [x] Shortness of breath when laying flat   [] Shortness of breath at rest   [] Shortness of breath with exertion. Vascular:  [] Pain in legs with walking   [] Pain in legs at rest   [] Pain in legs when laying flat   [] Claudication   [] Pain in feet when walking  [] Pain in feet at rest  [] Pain in feet when laying flat   [] History of DVT   [] Phlebitis   [] Swelling in legs   [] Varicose veins   [] Non-healing ulcers Pulmonary:   [] Uses home oxygen   [] Productive cough   [] Hemoptysis   [] Wheeze  [] COPD   [] Asthma Neurologic:  [] Dizziness  [] Blackouts   [] Seizures   [] History of stroke   [] History of TIA  [] Aphasia   [] Temporary blindness   [] Dysphagia   [] Weakness or numbness in arms   [] Weakness or numbness in legs Musculoskeletal:  [] Arthritis   [] Joint swelling   [] Joint pain   [] Low back pain Hematologic:  [] Easy bruising  [] Easy bleeding   [] Hypercoagulable state   [] Anemic  [] Hepatitis Gastrointestinal:  [] Blood in stool   [] Vomiting blood  [] Gastroesophageal reflux/heartburn   [] Difficulty swallowing. Genitourinary:  [x] Chronic kidney disease   [] Difficult urination  [] Frequent urination  [] Burning with urination   [] Blood in urine Skin:  [] Rashes   [] Ulcers   [] Wounds Psychological:  [] History of anxiety   []  History of major depression.  Physical Examination  Vitals:   03/27/17  2054 03/28/17 0347 03/28/17 0800 03/28/17 1153  BP: (!) 135/45 (!) 112/52 (!) 121/34 (!) 125/45  Pulse: (!) 101 91 94 94  Resp: 18 20 18 17   Temp: 98 F (36.7 C) 98.3 F (36.8 C) 97.7 F (36.5 C) 98.1 F (36.7 C)  TempSrc: Oral Oral Oral Oral  SpO2: 92% 98% 94% 95%  Weight:      Height:       Body mass index is 26.61 kg/m. Gen:  WD/WN, NAD Head: [place]/AT, No temporalis wasting.  Ear/Nose/Throat: Hearing grossly intact Eyes: Sclera non-icteric, conjunctiva clear Neck: Trachea midline.  No JVD.  Pulmonary:  Labored while lying flat Cardiac: RRR, normal S1, S2. Vascular:  Vessel Right Left  Radial Palpable Palpable  Ulnar Palpable Palpable  Brachial Palpable Palpable  Carotid Palpable, without bruit Palpable, without bruit  Aorta Not palpable N/A  Femoral Palpable Palpable  Popliteal Palpable Palpable  PT Palpable Palpable  DP Palpable Palpable   Gastrointestinal: soft, non-tender/non-distended. No guarding/reflex.  Musculoskeletal: Extremities without ischemic changes.  No deformity or atrophy. No edema. Neurologic: Responds to pain. Symmetrical.   Dermatologic: No rashes or ulcers noted.  No cellulitis or open wounds. Lymph : No Cervical, Axillary, or Inguinal lymphadenopathy.  CBC Lab Results  Component Value Date   WBC 13.9 (H) 03/28/2017   HGB 9.1 (L) 03/28/2017   HCT 28.2 (L) 03/28/2017   MCV 94.3 03/28/2017   PLT 226 03/28/2017   BMET    Component Value Date/Time   NA 140 03/27/2017 0556   NA 138 04/08/2015 2133   K 4.2 03/27/2017 0556   K 4.0 04/08/2015 2133   CL 118 (H) 03/27/2017 0556   CL 108 04/08/2015 2133   CO2 11 (L) 03/27/2017 0556   CO2 25 04/08/2015 2133   GLUCOSE 97 03/27/2017 0556   GLUCOSE 181 (H) 04/08/2015 2133   BUN 106 (H) 03/27/2017 0556   BUN 60 (H) 04/08/2015 2133   CREATININE 11.83 (H) 03/27/2017 0556   CREATININE 4.04 (H) 04/08/2015 2133   CALCIUM 8.5 (L) 03/27/2017 0556   CALCIUM 8.5 (L) 04/08/2015 2133   GFRNONAA 4 (L)  03/27/2017 0556   GFRNONAA 14 (L) 04/08/2015 2133   GFRAA 4 (L) 03/27/2017 0556   GFRAA 16 (L) 04/08/2015 2133   Estimated Creatinine Clearance: 5.1 mL/min (A) (by C-G formula based on SCr of 11.83 mg/dL (H)).  COAG Lab Results  Component Value Date   INR 1.35 03/27/2017   INR 1.0 12/28/2012   Radiology Ct Abdomen Pelvis Wo Contrast  Result Date: 03/27/2017 CLINICAL DATA:  Acute onset of diarrhea and generalized abdominal pain. Mucus in the stool. Initial encounter. EXAM: CT ABDOMEN AND PELVIS WITHOUT CONTRAST TECHNIQUE: Multidetector CT imaging of the abdomen and pelvis was performed following the standard protocol without IV contrast. COMPARISON:  CT of the abdomen and pelvis performed 02/15/2017 FINDINGS: Lower chest: Scattered coronary artery calcification is seen. Mild left basilar atelectasis or scarring is noted. Hepatobiliary: The mildly nodular contour of the liver raises question for mild hepatic cirrhosis. The gallbladder is grossly unremarkable. The common bile duct remains normal in caliber. Pancreas: The pancreas is within normal limits. Spleen: The spleen is unremarkable in appearance. Adrenals/Urinary Tract: The adrenal glands are unremarkable in appearance. Mild bilateral renal atrophy is noted. Nonspecific perinephric stranding is noted bilaterally. There is no evidence of hydronephrosis. No renal or ureteral stones are identified. Stomach/Bowel: The appendix is grossly unremarkable in appearance. There is no evidence for appendicitis. Mild diffuse wall thickening is noted along the entirety of the colon, with vague diffuse soft tissue inflammation. There is no evidence of perforation. Vascular/Lymphatic: The abdominal aorta is unremarkable in appearance. The inferior vena cava is grossly unremarkable. No retroperitoneal lymphadenopathy is seen. No pelvic sidewall lymphadenopathy is identified. Reproductive: The bladder is mildly distended and grossly unremarkable. There  is  impression on the base of the bladder from the enlarged prostate, which measures 5.5 cm in transverse dimension. Other: No additional soft tissue abnormalities are seen. Musculoskeletal: No acute osseous abnormalities are identified. The visualized musculature is unremarkable in appearance. IMPRESSION: 1. Mild diffuse wall thickening along the entirety of the colon, with vague diffuse soft tissue inflammation. This is concerning for acute infectious or inflammatory colitis. 2. Mildly nodular contour of the liver raises raises question for mild hepatic cirrhosis. 3. Mild bilateral renal atrophy noted. 4. Enlarged prostate noted. 5. Scattered coronary artery calcifications. Electronically Signed   By: Garald Balding M.D.   On: 03/27/2017 02:33   Dg Chest 1 View  Result Date: 03/28/2017 CLINICAL DATA:  Infectious colitis.  Abdominal pain. EXAM: CHEST 1 VIEW COMPARISON:  02/17/2017 FINDINGS: Chronic mild cardiomegaly. Artifact overlies the chest. The right chest appears clear. There is worsened atelectasis in the left lower lobe. Possible small left effusion. IMPRESSION: Atelectasis in the left lower lobe.  Possible small left effusion. Electronically Signed   By: Nelson Chimes M.D.   On: 03/28/2017 13:51   Assessment/Plan Harlan Magdalene Patricia is a 76 year old male with a known history of chronic kidney disease stage IV, type 2 diabetes mellitus, hypertension, hyperlipidemia, benign prostate hypertrophy with C. difficile colitis with metabolic acidosis / acute on chronic renal disease in need of dialysis.  1. Metabolic Acidosis: patient is in need of dialysis due to acute on chronic renal failure. Recommend placement of temporary dialysis catheter in order to dialyze. 2. Acute on Chronic Renal Failure: If patient does not recover he will need more permanent dialysis access. Would place permcath - please re-consult if needed. Outpatient follow up with vein mapping in future.    Discussed with Dr.  Mayme Genta, PA-C  03/28/2017 5:30 PM

## 2017-03-28 NOTE — Op Note (Signed)
  OPERATIVE NOTE   PROCEDURE: 1. Ultrasound guidance for vascular access right femoral vein 2. Placement of a 30 cm triple lumen dialysis catheter right femoral vein  PRE-OPERATIVE DIAGNOSIS: 1. Acute renal failure   POST-OPERATIVE DIAGNOSIS: Same  SURGEON: Leotis Pain, MD  ASSISTANT(S): Hezzie Bump, PA-C  ANESTHESIA: local  ESTIMATED BLOOD LOSS: Minimal   FINDING(S): 1. None  SPECIMEN(S): None  INDICATIONS:  Patient is a 77 y.o.male who presents with renal failure and he needs a catheter for dialysis at this time.  Risks and benefits were discussed, and informed consent was obtained..  DESCRIPTION: After obtaining full informed written consent, the patient was laid flat in the bed. The right groin was sterilely prepped and draped in a sterile surgical field was created. The right femoral vein was visualized with ultrasound and found to be widely patent. It was then accessed under direct guidance without difficulty with a Seldinger needle and a permanent image was recorded. A J-wire was then placed. After skin nick and dilatation, a 30 cm triple lumen dialysis catheter was placed over the wire and the wire was removed. The lumens withdrew dark red nonpulsatile blood and flushed easily with sterile saline. The catheter was secured to the skin with 3 nylon sutures. Sterile dressing was placed.  COMPLICATIONS: None  CONDITION: Stable  Leotis Pain 03/28/2017 5:32 PM  This note was created with Dragon Medical transcription system. Any errors in dictation are purely unintentional.

## 2017-03-28 NOTE — Progress Notes (Signed)
Post- HD VS.

## 2017-03-28 NOTE — Progress Notes (Signed)
Spoke with dr. Vianne Bulls regarding patient bp 121/34 and scheduled bp medication. Per md hold metoprolol, norvasc and hydralazine. Okay to start on clear liquid diet, if tolerates may advance to soft diet this afternoon

## 2017-03-28 NOTE — Progress Notes (Signed)
[facility] [place] NAME: Martin Gilbert    MR#:  [id number]  DATE OF BIRTH:  Feb 21, 1941  SUBJECTIVE: admitted  for C. difficile colitis, has recurrent C. difficile colitis, less nauseous but he is very lethargic today. According the family had diarrhea last night. BP soft.   CHIEF COMPLAINT:   Chief Complaint  Patient presents with  . Abdominal Pain  . Diarrhea    REVIEW OF SYSTEMS:    Review of Systems  Unable to perform ROS: Acuity of condition    Nutrition:  not Tolerating Diet: Tolerating PT:      DRUG ALLERGIES:  No Known Allergies  VITALS:  Blood pressure (!) 125/45, pulse 94, temperature 98.1 F (36.7 C), temperature source Oral, resp. rate 17, height 5\' 8"  (1.727 m), weight 79.4 kg (175 lb), SpO2 95 %.  PHYSICAL EXAMINATION:   Physical Exam  GENERAL:  76 y.o.-year-old patient lying in the bed Moaning.  EYES: Pupils equal, round, reactive to light No scleral icterus. Extraocular muscles intact.  HEENT: Head atraumatic, normocephalic.  Oropharynx and nasopharynx clear.  NECK:  Supple, no jugular venous distention. No thyroid enlargement, no tenderness.  LUNGS: Normal breath sounds bilaterally, no wheezing, rales,rhonchi or crepitation. No use of accessory muscles of respiration.  CARDIOVASCULAR: S1, S2 normal. No murmurs, rubs, or gallops.  ABDOMEN: grinning to palpating left side of abdomen EXTREMITIES: No pedal edema, cyanosis, or clubbing.  NEUROLOGIC: Lethargic today PSYCHIATRIC:  lethargic.  SKIN: No obvious rash, lesion, or ulcer.    LABORATORY PANEL:   CBC  Recent Labs Lab 03/28/17 0640  WBC 13.9*  HGB 9.1*  HCT 28.2*  PLT 226   ------------------------------------------------------------------------------------------------------------------  Chemistries   Recent Labs Lab 03/26/17 2028 03/27/17 0556  NA 138 140  K 4.3 4.2  CL 116* 118*  CO2 12* 11*  GLUCOSE 126* 97   BUN 97* 106*  CREATININE 11.51* 11.83*  CALCIUM 9.0 8.5*  AST 20  --   ALT 14*  --   ALKPHOS 98  --   BILITOT 0.5  --    ------------------------------------------------------------------------------------------------------------------  Cardiac Enzymes  Recent Labs Lab 03/27/17 1732  TROPONINI 0.08*   ------------------------------------------------------------------------------------------------------------------  RADIOLOGY:  Ct Abdomen Pelvis Wo Contrast  Result Date: 03/27/2017 CLINICAL DATA:  Acute onset of diarrhea and generalized abdominal pain. Mucus in the stool. Initial encounter. EXAM: CT ABDOMEN AND PELVIS WITHOUT CONTRAST TECHNIQUE: Multidetector CT imaging of the abdomen and pelvis was performed following the standard protocol without IV contrast. COMPARISON:  CT of the abdomen and pelvis performed 02/15/2017 FINDINGS: Lower chest: Scattered coronary artery calcification is seen. Mild left basilar atelectasis or scarring is noted. Hepatobiliary: The mildly nodular contour of the liver raises question for mild hepatic cirrhosis. The gallbladder is grossly unremarkable. The common bile duct remains normal in caliber. Pancreas: The pancreas is within normal limits. Spleen: The spleen is unremarkable in appearance. Adrenals/Urinary Tract: The adrenal glands are unremarkable in appearance. Mild bilateral renal atrophy is noted. Nonspecific perinephric stranding is noted bilaterally. There is no evidence of hydronephrosis. No renal or ureteral stones are identified. Stomach/Bowel: The appendix is grossly unremarkable in appearance. There is no evidence for appendicitis. Mild diffuse wall thickening is noted along the entirety of the colon, with vague diffuse soft tissue inflammation. There is no evidence of perforation. Vascular/Lymphatic: The abdominal aorta is unremarkable in appearance. The inferior vena cava is grossly unremarkable. No retroperitoneal lymphadenopathy is seen. No  pelvic sidewall lymphadenopathy is  identified. Reproductive: The bladder is mildly distended and grossly unremarkable. There is impression on the base of the bladder from the enlarged prostate, which measures 5.5 cm in transverse dimension. Other: No additional soft tissue abnormalities are seen. Musculoskeletal: No acute osseous abnormalities are identified. The visualized musculature is unremarkable in appearance. IMPRESSION: 1. Mild diffuse wall thickening along the entirety of the colon, with vague diffuse soft tissue inflammation. This is concerning for acute infectious or inflammatory colitis. 2. Mildly nodular contour of the liver raises raises question for mild hepatic cirrhosis. 3. Mild bilateral renal atrophy noted. 4. Enlarged prostate noted. 5. Scattered coronary artery calcifications. Electronically Signed   By: Garald Balding M.D.   On: 03/27/2017 02:33     ASSESSMENT AND PLAN:   Active Problems:   Anemia of chronic disease   Colitis   Demand ischemia ([place])   ARF (acute renal failure) ([name])   Enteritis due to Clostridium difficile   #1 recurrent C. difficile colitis. Continue vancomycin, temperature in his prolonged course of vancomycin 125 mg 4 times daily for 14 days, after that 2 times per day for week and then once per day for 1 week and then every 2-3 days for next 2-8 weeks.. Seen by gastroenterology, appreciate their recommendation. #2 uremic encephalopathy;with worsening renal failure, very lethargic today, continued bicarbonate drip, nephro following, patient is now willing to do trial dialysis. Vascular  consulted. Discussed with family about this through interpreter.  #3 essential hypertension: BP is little soft, hold amlodipine, furosemide, hydralazine. Patient anyways lethargic today. #4 diabetes mellitus type 2 l hold the insulin at this time because of lethargy and poor by mouth intake. #5 prognosis poor, explained to family that he is really sick. Used hospital  provided through [place] interpreter.  All the records are reviewed and case discussed with Care Management/Social Workerr. Management plans discussed with the patient, family and they are in agreement.  CODE STATUS: full  TOTAL TIME TAKING CARE OF THIS PATIENT: 35 minutes.   POSSIBLE D/C IN 7 DAYS, DEPENDING ON CLINICAL CONDITION.   Epifanio Lesches M.D on 03/28/2017 at 12:52 PM  Between 7am to 6pm - Pager - [phone]  After 6pm go to [email or web address] - password [email or web address] [facility]  Office  [phone]  CC: Primary care physician; [name], MD

## 2017-03-28 NOTE — Care Management (Signed)
Patient is to start a dialysis trial today. Updated [name] with [facility] [employer].

## 2017-03-28 NOTE — Care Management Important Message (Deleted)
Important Message  Patient Details  Name: Martin Gilbert MRN: [id number] Date of Birth: 1941-11-18   Medicare Important Message Given:  Yes Signed IM notice given and given in [place], [place], RN 03/28/2017, 11:54 AM

## 2017-03-28 NOTE — Progress Notes (Signed)
[facility]  ROUNDING NOTE   Subjective:   History taken with assistance of an interpreter.  Patient now willing to do a trial of dialysis.   Continues to have abdominal pain and diarrhea.   Objective:  Vital signs in last 24 hours:  Temp:  [97.5 F (36.4 C)-98.3 F (36.8 C)] 97.7 F (36.5 C) (04/13 0800) Pulse Rate:  [91-101] 94 (04/13 0800) Resp:  [18-20] 18 (04/13 0800) BP: (112-140)/(34-52) 121/34 (04/13 0800) SpO2:  [92 %-98 %] 94 % (04/13 0800)  Weight change:  Filed Weights   03/26/17 2006  Weight: 79.4 kg (175 lb)    Intake/Output: I/O last 3 completed shifts: In: 2116.3 [P.O.:120; I.V.:1296.3; IV Piggyback:700] Out: 200 [Emesis/NG output:200]   Intake/Output this shift:  No intake/output data recorded.  Physical Exam: General: Ill appearing  Head: Normocephalic, atraumatic. Moist oral mucosal membranes  Eyes: Anicteric, PERRL  Neck: Supple, trachea midline  Lungs:  Clear to auscultation  Heart: Regular rate and rhythm  Abdomen:  Soft, nontender,   Extremities:  no peripheral edema.  Neurologic: Nonfocal, moving all four extremities  Skin: No lesions  Access: none    Basic Metabolic Panel:  Recent Labs Lab 03/26/17 2028 03/27/17 0556  NA 138 140  K 4.3 4.2  CL 116* 118*  CO2 12* 11*  GLUCOSE 126* 97  BUN 97* 106*  CREATININE 11.51* 11.83*  CALCIUM 9.0 8.5*    Liver Function Tests:  Recent Labs Lab 03/26/17 2028  AST 20  ALT 14*  ALKPHOS 98  BILITOT 0.5  PROT 7.0  ALBUMIN 3.3*    Recent Labs Lab 03/26/17 2028  LIPASE 159*   No results for input(s): AMMONIA in the last 168 hours.  CBC:  Recent Labs Lab 03/26/17 2028 03/27/17 0556 03/28/17 0640  WBC 12.2* 11.4* 13.9*  HGB 10.4* 9.0* 9.1*  HCT 32.1* 28.2* 28.2*  MCV 95.1 94.2 94.3  PLT 272 238 226    Cardiac Enzymes:  Recent Labs Lab 03/26/17 2028 03/27/17 0058 03/27/17 0556 03/27/17 1214 03/27/17 1732  TROPONINI 0.12* 0.08* 0.05* 0.08* 0.08*     BNP: Invalid input(s): POCBNP  CBG:  Recent Labs Lab 03/28/17 0733  GLUCAP 114*    Microbiology: Results for orders placed or performed during the hospital encounter of 03/26/17  C difficile quick scan w PCR reflex     Status: Abnormal   Collection Time: 03/27/17  4:33 AM  Result Value Ref Range Status   C Diff antigen POSITIVE (A) NEGATIVE Final    Comment: CRITICAL RESULT CALLED TO, READ BACK BY AND VERIFIED WITH: ROBERT Martinique ON 03/27/17 AT605 BY TLB    C Diff toxin POSITIVE (A) NEGATIVE Final   C Diff interpretation Toxin producing C. difficile detected.  Final    Coagulation Studies:  Recent Labs  03/27/17 0415  LABPROT 16.8*  INR 1.35    Urinalysis:  Recent Labs  03/27/17 0750  COLORURINE YELLOW*  LABSPEC 1.014  PHURINE 5.0  GLUCOSEU 50*  HGBUR NEGATIVE  BILIRUBINUR NEGATIVE  KETONESUR NEGATIVE  PROTEINUR 100*  NITRITE NEGATIVE  LEUKOCYTESUR LARGE*      Imaging: Ct Abdomen Pelvis Wo Contrast  Result Date: 03/27/2017 CLINICAL DATA:  Acute onset of diarrhea and generalized abdominal pain. Mucus in the stool. Initial encounter. EXAM: CT ABDOMEN AND PELVIS WITHOUT CONTRAST TECHNIQUE: Multidetector CT imaging of the abdomen and pelvis was performed following the standard protocol without IV contrast. COMPARISON:  CT of the abdomen and pelvis performed 02/15/2017 FINDINGS: Lower chest:  Scattered coronary artery calcification is seen. Mild left basilar atelectasis or scarring is noted. Hepatobiliary: The mildly nodular contour of the liver raises question for mild hepatic cirrhosis. The gallbladder is grossly unremarkable. The common bile duct remains normal in caliber. Pancreas: The pancreas is within normal limits. Spleen: The spleen is unremarkable in appearance. Adrenals/Urinary Tract: The adrenal glands are unremarkable in appearance. Mild bilateral renal atrophy is noted. Nonspecific perinephric stranding is noted bilaterally. There is no evidence of  hydronephrosis. No renal or ureteral stones are identified. Stomach/Bowel: The appendix is grossly unremarkable in appearance. There is no evidence for appendicitis. Mild diffuse wall thickening is noted along the entirety of the colon, with vague diffuse soft tissue inflammation. There is no evidence of perforation. Vascular/Lymphatic: The abdominal aorta is unremarkable in appearance. The inferior vena cava is grossly unremarkable. No retroperitoneal lymphadenopathy is seen. No pelvic sidewall lymphadenopathy is identified. Reproductive: The bladder is mildly distended and grossly unremarkable. There is impression on the base of the bladder from the enlarged prostate, which measures 5.5 cm in transverse dimension. Other: No additional soft tissue abnormalities are seen. Musculoskeletal: No acute osseous abnormalities are identified. The visualized musculature is unremarkable in appearance. IMPRESSION: 1. Mild diffuse wall thickening along the entirety of the colon, with vague diffuse soft tissue inflammation. This is concerning for acute infectious or inflammatory colitis. 2. Mildly nodular contour of the liver raises raises question for mild hepatic cirrhosis. 3. Mild bilateral renal atrophy noted. 4. Enlarged prostate noted. 5. Scattered coronary artery calcifications. Electronically Signed   By: Garald Balding M.D.   On: 03/27/2017 02:33     Medications:   .  sodium bicarbonate  infusion 1000 mL 75 mL/hr at 03/28/17 0151   . amLODipine  2.5 mg Oral Daily  . aspirin EC  81 mg Oral Daily  . atorvastatin  20 mg Oral Daily  . baclofen  10 mg Oral TID  . doxazosin  2 mg Oral QHS  . feeding supplement (GLUCERNA SHAKE)  237 mL Oral TID BM  . ferrous sulfate  325 mg Oral Q breakfast  . hydrALAZINE  50 mg Oral TID  . metoprolol succinate  25 mg Oral Daily  . multivitamin with minerals  1 tablet Oral Daily  . pantoprazole  40 mg Oral Daily  . polyvinyl alcohol  1 drop Both Eyes BID  . sodium  bicarbonate  650 mg Oral BID  . sodium chloride flush  3 mL Intravenous Q12H  . tamsulosin  0.4 mg Oral QPC supper  . vancomycin  125 mg Oral Q6H   acetaminophen **OR** acetaminophen, gabapentin, morphine injection, ondansetron (ZOFRAN) IV, ondansetron, promethazine  Assessment/ Plan:  [name] is a 76 y.o. Hispanic malewith diabetes mellitus type II, hypertension, hepatic cirrhosis, BPH, gout, diabetic neuropathy  1. Acute renal failure with metabolic acidosis on chronic kidney disease stage V: with proteinuria, metabolic acidosis and hypoalbuminemia.  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 Baseline creatinine of 6.23, GFR of 8 on 02/11/17. Followed by Dr. Juanito Doom, [facility].  - Discussed case with Dr. Juanito Doom - Dialysis and ESRD education provided patient and family - IV sodium bicarb gtt - Consult vascular for dialysis catheter. Initiate dialysis once catheter is placed.   2. Hypertension: blood pressure at goal Home regimen of amlodipine, furosemide, hydralazine, and metoprolol. Not on ACE-I/ARB due to advanced renal disease.   3. Diabetes mellitus type II with chronic kidney disease: insulin dependent  4. Anemia of chronic kidney  disease: gets outpatient Aranesp. Hemoglobin 9.1 - discussed ESA therapy.    5. Colitis: failure outpatient therapy for c. Diff colitis. Seems patient stopped taking his antibiotics.  - appreciate GI input - PO vanco   LOS: 1 Aleanna Menge 4/13/201810:51 AM

## 2017-03-29 ENCOUNTER — Inpatient Hospital Stay: Payer: Medicare Other

## 2017-03-29 LAB — CBC
HEMATOCRIT: 23.3 % — AB (ref 40.0–52.0)
Hemoglobin: 7.7 g/dL — ABNORMAL LOW (ref 13.0–18.0)
MCH: 30.1 pg (ref 26.0–34.0)
MCHC: 33 g/dL (ref 32.0–36.0)
MCV: 91.2 fL (ref 80.0–100.0)
PLATELETS: 191 10*3/uL (ref 150–440)
RBC: 2.55 MIL/uL — ABNORMAL LOW (ref 4.40–5.90)
RDW: 15 % — AB (ref 11.5–14.5)
WBC: 11.3 10*3/uL — ABNORMAL HIGH (ref 3.8–10.6)

## 2017-03-29 LAB — BASIC METABOLIC PANEL
Anion gap: 9 (ref 5–15)
BUN: 75 mg/dL — ABNORMAL HIGH (ref 6–20)
CHLORIDE: 104 mmol/L (ref 101–111)
CO2: 27 mmol/L (ref 22–32)
CREATININE: 9.47 mg/dL — AB (ref 0.61–1.24)
Calcium: 7.4 mg/dL — ABNORMAL LOW (ref 8.9–10.3)
GFR calc non Af Amer: 5 mL/min — ABNORMAL LOW (ref >60)
GFR, EST AFRICAN AMERICAN: 5 mL/min — AB (ref >60)
GLUCOSE: 110 mg/dL — AB (ref 65–99)
Potassium: 3.2 mmol/L — ABNORMAL LOW (ref 3.5–5.1)
Sodium: 140 mmol/L (ref 135–145)

## 2017-03-29 LAB — HEPATITIS B SURFACE ANTIBODY,QUALITATIVE: Hep B S Ab: NONREACTIVE

## 2017-03-29 LAB — RENAL FUNCTION PANEL
ALBUMIN: 2.2 g/dL — AB (ref 3.5–5.0)
Anion gap: 8 (ref 5–15)
BUN: 76 mg/dL — ABNORMAL HIGH (ref 6–20)
CALCIUM: 7.3 mg/dL — AB (ref 8.9–10.3)
CO2: 28 mmol/L (ref 22–32)
CREATININE: 9.75 mg/dL — AB (ref 0.61–1.24)
Chloride: 105 mmol/L (ref 101–111)
GFR calc non Af Amer: 5 mL/min — ABNORMAL LOW (ref >60)
GFR, EST AFRICAN AMERICAN: 5 mL/min — AB (ref >60)
Glucose, Bld: 111 mg/dL — ABNORMAL HIGH (ref 65–99)
PHOSPHORUS: 4.9 mg/dL — AB (ref 2.5–4.6)
Potassium: 3.3 mmol/L — ABNORMAL LOW (ref 3.5–5.1)
SODIUM: 141 mmol/L (ref 135–145)

## 2017-03-29 LAB — LACTIC ACID, PLASMA
LACTIC ACID, VENOUS: 1.3 mmol/L (ref 0.5–1.9)
Lactic Acid, Venous: 1.4 mmol/L (ref 0.5–1.9)

## 2017-03-29 LAB — PARATHYROID HORMONE, INTACT (NO CA): PTH: 91 pg/mL — ABNORMAL HIGH (ref 15–65)

## 2017-03-29 LAB — AMMONIA: Ammonia: 16 umol/L (ref 9–35)

## 2017-03-29 LAB — GLUCOSE, CAPILLARY: Glucose-Capillary: 105 mg/dL — ABNORMAL HIGH (ref 65–99)

## 2017-03-29 LAB — HEPATITIS B CORE ANTIBODY, TOTAL: Hep B Core Total Ab: NEGATIVE

## 2017-03-29 LAB — HEPATITIS C ANTIBODY: HCV Ab: <0.1 s/co ratio (ref 0.0–0.9)

## 2017-03-29 LAB — HEPATITIS B SURFACE ANTIGEN: Hepatitis B Surface Ag: NEGATIVE

## 2017-03-29 MED ORDER — ORAL CARE MOUTH RINSE: 15.0000 mL | Freq: Two times a day (BID) | OROMUCOSAL | Status: DC

## 2017-03-29 MED ORDER — MORPHINE SULFATE (PF) 4 MG/ML IV SOLN
1.0000 mg | Freq: Once | INTRAVENOUS | Status: AC
Start: 2017-03-29 — End: 2017-03-29
  Administered 2017-03-29: 1 mg via INTRAVENOUS
  Filled 2017-03-29: qty 1

## 2017-03-29 MED ORDER — MORPHINE SULFATE (PF) 4 MG/ML IV SOLN
1.0000 mg | INTRAVENOUS | Status: DC | PRN
  Administered 2017-03-30 (×4): 1 mg via INTRAVENOUS
  Filled 2017-03-29 (×6): qty 1

## 2017-03-29 MED ORDER — KCL IN DEXTROSE-NACL 20-5-0.45 MEQ/L-%-% IV SOLN
INTRAVENOUS | Status: DC
  Administered 2017-03-29: 13:00:00 via INTRAVENOUS
  Filled 2017-03-29 (×3): qty 1000

## 2017-03-29 MED ORDER — CHLORHEXIDINE GLUCONATE 0.12 % MT SOLN
15.0000 mL | Freq: Two times a day (BID) | OROMUCOSAL | Status: DC
  Administered 2017-03-29 – 2017-04-04 (×9): 15 mL via OROMUCOSAL
  Filled 2017-03-29 (×9): qty 15

## 2017-03-29 MED ORDER — METRONIDAZOLE IN NACL 5-0.79 MG/ML-% IV SOLN
500.0000 mg | Freq: Three times a day (TID) | INTRAVENOUS | Status: DC
  Administered 2017-03-29 – 2017-04-01 (×9): 500 mg via INTRAVENOUS
  Filled 2017-03-29 (×12): qty 100

## 2017-03-29 MED ORDER — DEXTROSE-NACL 5-0.45 % IV SOLN: INTRAVENOUS | Status: DC

## 2017-03-29 MED ORDER — EPOETIN ALFA 10000 UNIT/ML IJ SOLN
10000.0000 [IU] | Freq: Once | INTRAMUSCULAR | Status: AC
  Administered 2017-03-29: 10000 [IU] via INTRAVENOUS
  Filled 2017-03-29: qty 1

## 2017-03-29 NOTE — Progress Notes (Signed)
Interpreter Raphael in to interpret for assessment.  Pt lying in bed moaning with eyes closed.  No verbalization with family or interpreter.  Family requesting med for pain.  Will request med from MD.

## 2017-03-29 NOTE — Progress Notes (Signed)
[facility]  ROUNDING NOTE   Subjective:   Seen and examined on hemodialysis. Second dialysis treatment.     HEMODIALYSIS FLOWSHEET:  Blood Flow Rate (mL/min): 250 mL/min Arterial Pressure (mmHg): -90 mmHg Venous Pressure (mmHg): 90 mmHg Transmembrane Pressure (mmHg): 50 mmHg Ultrafiltration Rate (mL/min): 40 mL/min Dialysate Flow Rate (mL/min): 500 ml/min Conductivity: Machine : 14.1 Conductivity: Machine : 14.1 Dialysis Fluid Bolus: Normal Saline Bolus Amount (mL): 250 mL Dialysate Change:  (3K) Intra-Hemodialysis Comments: Tolerating tx well.   Objective:  Vital signs in last 24 hours:  Temp:  [97.5 F (36.4 C)-98.5 F (36.9 C)] 98.2 F (36.8 C) (04/14 0838) Pulse Rate:  [60-100] 88 (04/14 0838) Resp:  [15-18] 16 (04/14 0838) BP: (79-143)/(42-76) 130/53 (04/14 0838) SpO2:  [94 %-100 %] 100 % (04/14 0838) Weight:  [77.9 kg (171 lb 11.8 oz)-79.3 kg (174 lb 13.2 oz)] 79.2 kg (174 lb 9.7 oz) (04/14 0838)  Weight change:  Filed Weights   03/28/17 2038 03/29/17 0523 03/29/17 0838  Weight: 77.9 kg (171 lb 11.8 oz) 79.3 kg (174 lb 13.2 oz) 79.2 kg (174 lb 9.7 oz)    Intake/Output: I/O last 3 completed shifts: In: 1865 [I.V.:1865] Out: -750    Intake/Output this shift:  Total I/O In: -  Out: -400   Physical Exam: General: Ill appearing  Head: Normocephalic, atraumatic. Moist oral mucosal membranes  Eyes: Anicteric, PERRL  Neck: Supple, trachea midline  Lungs:  Clear to auscultation  Heart: Regular rate and rhythm  Abdomen:  Soft, nontender,   Extremities:  no peripheral edema.  Neurologic: Nonfocal, moving all four extremities  Skin: No lesions  Access: none    Basic Metabolic Panel:  Recent Labs Lab 03/26/17 2028 03/27/17 0556 03/28/17 1800 03/29/17 0521  NA 138 140 140 140  141  K 4.3 4.2 3.5 3.2*  3.3*  CL 116* 118* 110 104  105  CO2 12* 11* 18* 27  28  GLUCOSE 126* 97 99 110*  111*  BUN 97* 106* 114* 75*  76*   CREATININE 11.51* 11.83* 13.21* 9.47*  9.75*  CALCIUM 9.0 8.5* 7.8* 7.4*  7.3*  PHOS  --   --  5.7* 4.9*    Liver Function Tests:  Recent Labs Lab 03/26/17 2028 03/28/17 1800 03/29/17 0521  AST 20 21  --   ALT 14* 9*  --   ALKPHOS 98 74  --   BILITOT 0.5 0.6  --   PROT 7.0 5.0*  --   ALBUMIN 3.3* 2.3* 2.2*    Recent Labs Lab 03/26/17 2028  LIPASE 159*   No results for input(s): AMMONIA in the last 168 hours.  CBC:  Recent Labs Lab 03/26/17 2028 03/27/17 0556 03/28/17 0640 03/28/17 1800 03/29/17 0521  WBC 12.2* 11.4* 13.9* 12.9* 11.3*  HGB 10.4* 9.0* 9.1* 8.9* 7.7*  HCT 32.1* 28.2* 28.2* 26.8* 23.3*  MCV 95.1 94.2 94.3 93.2 91.2  PLT 272 238 226 221 191    Cardiac Enzymes:  Recent Labs Lab 03/26/17 2028 03/27/17 0058 03/27/17 0556 03/27/17 1214 03/27/17 1732  TROPONINI 0.12* 0.08* 0.05* 0.08* 0.08*    BNP: Invalid input(s): POCBNP  CBG:  Recent Labs Lab 03/28/17 0733 03/29/17 0656  GLUCAP 114* 105*    Microbiology: Results for orders placed or performed during the hospital encounter of 03/26/17  C difficile quick scan w PCR reflex     Status: Abnormal   Collection Time: 03/27/17  4:33 AM  Result Value Ref Range Status   C  Diff antigen POSITIVE (A) NEGATIVE Final    Comment: CRITICAL RESULT CALLED TO, READ BACK BY AND VERIFIED WITH: ROBERT Martinique ON 03/27/17 AT605 BY TLB    C Diff toxin POSITIVE (A) NEGATIVE Final   C Diff interpretation Toxin producing C. difficile detected.  Final    Coagulation Studies:  Recent Labs  03/27/17 0415  LABPROT 16.8*  INR 1.35    Urinalysis:  Recent Labs  03/27/17 0750  COLORURINE YELLOW*  LABSPEC 1.014  PHURINE 5.0  GLUCOSEU 50*  HGBUR NEGATIVE  BILIRUBINUR NEGATIVE  KETONESUR NEGATIVE  PROTEINUR 100*  NITRITE NEGATIVE  LEUKOCYTESUR LARGE*      Imaging: Dg Chest 1 View  Result Date: 03/28/2017 CLINICAL DATA:  Infectious colitis.  Abdominal pain. EXAM: CHEST 1 VIEW  COMPARISON:  02/17/2017 FINDINGS: Chronic mild cardiomegaly. Artifact overlies the chest. The right chest appears clear. There is worsened atelectasis in the left lower lobe. Possible small left effusion. IMPRESSION: Atelectasis in the left lower lobe.  Possible small left effusion. Electronically Signed   By: Nelson Chimes M.D.   On: 03/28/2017 13:51     Medications:   . sodium chloride    .  sodium bicarbonate  infusion 1000 mL 75 mL/hr at 03/29/17 0214   . amLODipine  2.5 mg Oral Daily  . aspirin EC  81 mg Oral Daily  . atorvastatin  20 mg Oral Daily  . baclofen  10 mg Oral TID  . doxazosin  2 mg Oral QHS  . feeding supplement (GLUCERNA SHAKE)  237 mL Oral TID BM  . ferrous sulfate  325 mg Oral Q breakfast  . hydrALAZINE  50 mg Oral TID  . metoprolol succinate  25 mg Oral Daily  . multivitamin with minerals  1 tablet Oral Daily  . pantoprazole  40 mg Oral Daily  . polyvinyl alcohol  1 drop Both Eyes BID  . sodium bicarbonate  650 mg Oral BID  . sodium chloride flush  3 mL Intravenous Q12H  . tamsulosin  0.4 mg Oral QPC supper  . vancomycin  125 mg Oral Q6H   acetaminophen **OR** acetaminophen, gabapentin, ondansetron (ZOFRAN) IV, ondansetron, promethazine  Assessment/ Plan:  [name] is a 76 y.o. Hispanic malewith diabetes mellitus type II, hypertension, hepatic cirrhosis, BPH, gout, diabetic neuropathy  1. End Stage Renal Disease: with proteinuria, metabolic acidosis and hypoalbuminemia.  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 Baseline creatinine of 6.23, GFR of 8 on 02/11/17. Followed by Dr. Juanito Doom, [facility].  - Started on hemodialysis on 4/13.  Plan for dialysis outpatient at [place].   2. Hypertension: blood pressure at goal Home regimen of amlodipine, furosemide, hydralazine, and metoprolol. Not on ACE-I/ARB due to advanced renal disease.   3. Diabetes mellitus type II with chronic kidney disease:  insulin dependent  4. Anemia of chronic kidney disease: gets outpatient Aranesp.  - EPO with HD   5. Colitis: failure outpatient therapy for c. Diff colitis. Seems patient stopped taking his antibiotics.  - appreciate GI input - PO vanco   LOS: [place], [place] 4/14/20189:23 AM

## 2017-03-29 NOTE — Progress Notes (Signed)
[place] [facility] [place] NAME: Martin Gilbert    MR#:  [id number]  DATE OF BIRTH:  Nov 20, 1941  SUBJECTIVE: admitted  for C. difficile colitis, has recurrent C. difficile colitis, less nauseous but he is very lethargic today. According the family had diarrhea last night. BP soft.   CHIEF COMPLAINT:   Chief Complaint  Patient presents with  . Abdominal Pain  . Diarrhea    REVIEW OF SYSTEMS:    Review of Systems  Unable to perform ROS: Acuity of condition    Nutrition:  not Tolerating Diet: Tolerating PT:      DRUG ALLERGIES:  No Known Allergies  VITALS:  Blood pressure (!) 130/53, pulse 88, temperature 98.2 F (36.8 C), temperature source Axillary, resp. rate 16, height 5\' 8"  (1.727 m), weight 79.2 kg (174 lb 9.7 oz), SpO2 100 %.  PHYSICAL EXAMINATION:   Physical Exam  GENERAL:  76 y.o.-year-old patient lying in the bed Moaning.  EYES: Pupils equal, round, reactive to light No scleral icterus. Extraocular muscles intact.  HEENT: Head atraumatic, normocephalic.  Oropharynx and nasopharynx clear.  NECK:  Supple, no jugular venous distention. No thyroid enlargement, no tenderness.  LUNGS: Normal breath sounds bilaterally, no wheezing, rales,rhonchi or crepitation. No use of accessory muscles of respiration.  CARDIOVASCULAR: S1, S2 normal. No murmurs, rubs, or gallops.  ABDOMEN: grinning to palpating left side of abdomen EXTREMITIES: No pedal edema, cyanosis, or clubbing.  NEUROLOGIC: Lethargic today PSYCHIATRIC:  lethargic.  SKIN: No obvious rash, lesion, or ulcer.    LABORATORY PANEL:   CBC  Recent Labs Lab 03/29/17 0521  WBC 11.3*  HGB 7.7*  HCT 23.3*  PLT 191   ------------------------------------------------------------------------------------------------------------------  Chemistries   Recent Labs Lab 03/28/17 1800 03/29/17 0521  NA 140 140  141  K 3.5 3.2*  3.3*  CL 110 104  105  CO2  18* 27  28  GLUCOSE 99 110*  111*  BUN 114* 75*  76*  CREATININE 13.21* 9.47*  9.75*  CALCIUM 7.8* 7.4*  7.3*  AST 21  --   ALT 9*  --   ALKPHOS 74  --   BILITOT 0.6  --    ------------------------------------------------------------------------------------------------------------------  Cardiac Enzymes  Recent Labs Lab 03/27/17 1732  TROPONINI 0.08*   ------------------------------------------------------------------------------------------------------------------  RADIOLOGY:  Dg Chest 1 View  Result Date: 03/28/2017 CLINICAL DATA:  Infectious colitis.  Abdominal pain. EXAM: CHEST 1 VIEW COMPARISON:  02/17/2017 FINDINGS: Chronic mild cardiomegaly. Artifact overlies the chest. The right chest appears clear. There is worsened atelectasis in the left lower lobe. Possible small left effusion. IMPRESSION: Atelectasis in the left lower lobe.  Possible small left effusion. Electronically Signed   By: Nelson Chimes M.D.   On: 03/28/2017 13:51     ASSESSMENT AND PLAN:   Active Problems:   Anemia of chronic disease   Colitis   Demand ischemia ([place])   ARF (acute renal failure) ([name])   Enteritis due to Clostridium difficile   #1 recurrent C. difficile colitis. Continue vancomycin, temperature in his prolonged course of vancomycin 125 mg 4 times daily for 14 days, after that 2 times per day for week and then once per day for 1 week and then every 2-3 days for next 2-8 weeks.. Seen by gastroenterology, appreciate their recommendation. #2 lethargy;.unable to exclude acute cva.checked CT head ,ABG,ammonia level. Also follow up CT abdomen today for colitis   #3 essential hypertension:due to lethargy hold all po meds.  #  4 diabetes mellitus type 2 ; hold the insulin at this time because of lethargy and poor by mouth intake.   #5 prognosis poor, explained to family that he is really sick. Used hospital provided through [place] interpreter.  All the records are reviewed and case  discussed with Care Management/Social Workerr. Management plans discussed with the patient, family and they are in agreement.  CODE STATUS: full  TOTAL TIME TAKING CARE OF THIS PATIENT: 35 minutes.   POSSIBLE D/C IN 7 DAYS, DEPENDING ON CLINICAL CONDITION.   Epifanio Lesches M.D on 03/29/2017 at 10:52 AM  Between 7am to 6pm - Pager - [phone]  After 6pm go to [email or web address] - password [email or web address] [facility]  Office  [phone]  CC: Primary care physician; [name], MD

## 2017-03-29 NOTE — Progress Notes (Signed)
Pt was very lethargic this am , unable to take po meds, CT head and abdo done. Incontinent of loose green BM x 2 , incontinent of urine x 2. This afternoon pt became increasingly restless in bed, moaning more, but still not opening eyes, no speech, not following commands. Bladder scan done by RN as pt appears to be holding penis a lot Scan reveals 190 mls, MD notified of restlessness, family requests for more interventions and scan results. New orders noted. In and out foley placed and 225 mls removed, Img IV MS given. Family updated and questions answered as asked

## 2017-03-29 NOTE — Progress Notes (Signed)
Family at bedside , asking for what is wrong with him, RN explained in detail current tests and plan for am ( dialysis)  several times, family still voice concerns. Little effect noted post in/out cath and 1mg  IV Morphine , pt is still groaning at times , but is still very lethargic, not answering questions, non verbal. New consult noted

## 2017-03-30 DIAGNOSIS — G9341 Metabolic encephalopathy: Secondary | ICD-10-CM

## 2017-03-30 LAB — FOLATE: FOLATE: 17.8 ng/mL (ref >5.9)

## 2017-03-30 LAB — VITAMIN B12: VITAMIN B 12: 321 pg/mL (ref 180–914)

## 2017-03-30 LAB — TSH: TSH: 1.934 u[IU]/mL (ref 0.350–4.500)

## 2017-03-30 LAB — GLUCOSE, CAPILLARY: GLUCOSE-CAPILLARY: 88 mg/dL (ref 65–99)

## 2017-03-30 MED ORDER — PIPERACILLIN-TAZOBACTAM 3.375 G IVPB
3.3750 g | Freq: Two times a day (BID) | INTRAVENOUS | Status: DC
  Administered 2017-03-30 – 2017-04-02 (×8): 3.375 g via INTRAVENOUS
  Filled 2017-03-30 (×10): qty 50

## 2017-03-30 MED ORDER — THIAMINE HCL 100 MG/ML IJ SOLN
100.0000 mg | Freq: Every day | INTRAMUSCULAR | Status: DC
  Administered 2017-03-30 – 2017-04-01 (×3): 100 mg via INTRAVENOUS
  Filled 2017-03-30 (×3): qty 2

## 2017-03-30 MED ORDER — FOLIC ACID 5 MG/ML IJ SOLN
1.0000 mg | Freq: Every day | INTRAMUSCULAR | Status: DC
  Administered 2017-03-30 – 2017-04-01 (×3): 1 mg via INTRAVENOUS
  Filled 2017-03-30 (×4): qty 0.2

## 2017-03-30 MED ORDER — LORAZEPAM 2 MG/ML IJ SOLN
1.0000 mg | Freq: Once | INTRAMUSCULAR | Status: AC
  Administered 2017-03-30: 1 mg via INTRAVENOUS
  Filled 2017-03-30: qty 1

## 2017-03-30 NOTE — Consult Note (Signed)
Reason for Consult:Confusion Referring Physician: Vianne Bulls  CC: Confusion  HPI: Martin Gilbert is an 76 y.o. male who is unable to provide any history today.  All history provided by daughter who acts as an Astronomer.  They report has had complaints of abdominal pain for the past couple of months and has not been eating well.  Diagnosed with c. Diff in March.  On Wednesday he began to complain of pain and seemed confused as well.  Prior to his decline the patient was providing care for his mother and driving.  Patient now at times does not recognize family members and is severely agitated.  He has not eaten for 5 days.  He received dialysis Friday and yesterday.  He seemed to have some initial improvement but remains significantly altered.    Past Medical History:  Diagnosis Date  . Back pain   . BPH (benign prostatic hyperplasia)   . CKD (chronic kidney disease), stage IV ([place])   . Diabetes mellitus without complication ([place])   . HTN (hypertension)   . Hyperlipidemia     Past Surgical History:  Procedure Laterality Date  . hernia repair      Family History  Problem Relation Age of Onset  . Diabetes Neg Hx   . Hypertension Neg Hx     Social History:  reports that he has never smoked. He has never used smokeless tobacco. He reports that he does not drink alcohol or use drugs.  No Known Allergies  Medications:  I have reviewed the patient's current medications. Prior to Admission:  Prescriptions Prior to Admission  Medication Sig Dispense Refill Last Dose  . acetaminophen (TYLENOL) 500 MG tablet Take 500 mg by mouth every 8 (eight) hours as needed.   PRN at PRN  . allopurinol (ZYLOPRIM) 100 MG tablet Take 100 mg by mouth daily.   03/26/2017 at Unknown time  . amLODipine (NORVASC) 2.5 MG tablet Take 2.5 mg by mouth daily.   03/26/2017 at Unknown time  . aspirin 81 MG tablet Take 81 mg by mouth daily.   03/26/2017 at Unknown time  . atorvastatin (LIPITOR) 20 MG tablet Take  20 mg by mouth daily.   03/26/2017 at Unknown time  . doxazosin (CARDURA) 2 MG tablet Take 2 mg by mouth at bedtime.    03/26/2017 at Unknown time  . ferrous sulfate 325 (65 FE) MG tablet Take 325 mg by mouth daily with breakfast.  11 03/26/2017 at Unknown time  . gabapentin (NEURONTIN) 100 MG capsule Take 1 capsule (100 mg total) by mouth every 8 (eight) hours as needed (tingling or numbness in feet). 90 capsule 0 03/26/2017 at Unknown time  . glipiZIDE (GLUCOTROL) 5 MG tablet Take 5 mg by mouth daily.   03/26/2017 at Unknown time  . hydrALAZINE (APRESOLINE) 50 MG tablet Take 50 mg by mouth 3 (three) times daily.   03/26/2017 at Unknown time  . metoprolol succinate (TOPROL-XL) 25 MG 24 hr tablet Take 25 mg by mouth daily.   03/26/2017 at Unknown time  . Multiple Vitamin (MULTI-VITAMINS) TABS Take 1 tablet by mouth daily.   03/26/2017 at Unknown time  . ondansetron (ZOFRAN) 4 MG tablet Take 1 tablet (4 mg total) by mouth every 6 (six) hours as needed for nausea. 20 tablet 0 03/26/2017 at Unknown time  . pantoprazole (PROTONIX) 40 MG tablet Take 1 tablet (40 mg total) by mouth daily. 30 tablet 6 03/26/2017 at Unknown time  . sodium bicarbonate 650 MG tablet Take 650  mg by mouth 2 (two) times daily.    03/26/2017 at Unknown time  . tamsulosin (FLOMAX) 0.4 MG CAPS capsule Take 1 capsule (0.4 mg total) by mouth daily after supper. 30 capsule 0 03/26/2017 at Unknown time  . Vitamin D, Cholecalciferol, 1000 UNITS TABS Take by mouth.   03/26/2017 at Unknown time  . baclofen (LIORESAL) 10 MG tablet Take 1 tablet (10 mg total) by mouth 3 (three) times daily. (Patient not taking: Reported on 01/30/2017) 30 tablet 0 Not Taking at Unknown time  . carbamide peroxide (DEBROX) 6.5 % otic solution Place 5 drops into the right ear 2 (two) times daily. (Patient not taking: Reported on 01/30/2017) 15 mL 2 Not Taking at Unknown time  . feeding supplement, GLUCERNA SHAKE, (GLUCERNA SHAKE) LIQD Take 237 mLs by mouth 3 (three) times daily  between meals. 90 Can 6   . Polyvinyl Alcohol-Povidone (REFRESH OP) Place 1 drop into both eyes 2 (two) times daily.   UNKNOWN at UNKNOWN  . vancomycin (VANCOCIN) 50 mg/mL oral solution Take 2.5 mLs (125 mg total) by mouth every 6 (six) hours. (Patient not taking: Reported on 03/27/2017) 120 mL 0 Completed Course at Unknown time   Scheduled: . amLODipine  2.5 mg Oral Daily  . aspirin EC  81 mg Oral Daily  . atorvastatin  20 mg Oral Daily  . baclofen  10 mg Oral TID  . chlorhexidine  15 mL Mouth Rinse BID  . doxazosin  2 mg Oral QHS  . feeding supplement (GLUCERNA SHAKE)  237 mL Oral TID BM  . ferrous sulfate  325 mg Oral Q breakfast  . hydrALAZINE  50 mg Oral TID  . mouth rinse  15 mL Mouth Rinse q12n4p  . metoprolol succinate  25 mg Oral Daily  . metronidazole  500 mg Intravenous Q8H  . multivitamin with minerals  1 tablet Oral Daily  . pantoprazole  40 mg Oral Daily  . piperacillin-tazobactam (ZOSYN)  IV  3.375 g Intravenous Q12H  . polyvinyl alcohol  1 drop Both Eyes BID  . sodium bicarbonate  650 mg Oral BID  . tamsulosin  0.4 mg Oral QPC supper    ROS: History obtained from daughter  General ROS: negative for - chills, fatigue, fever, night sweats, weight gain or weight loss Psychological ROS: negative for - behavioral disorder, hallucinations, memory difficulties, mood swings or suicidal ideation Ophthalmic ROS: negative for - blurry vision, double vision, eye pain or loss of vision ENT ROS: negative for - epistaxis, nasal discharge, oral lesions, sore throat, tinnitus or vertigo Allergy and Immunology ROS: negative for - hives or itchy/watery eyes Hematological and Lymphatic ROS: negative for - bleeding problems, bruising or swollen lymph nodes Endocrine ROS: negative for - galactorrhea, hair pattern changes, polydipsia/polyuria or temperature intolerance Respiratory ROS: negative for - cough, hemoptysis, shortness of breath or wheezing Cardiovascular ROS: negative for -  chest pain, dyspnea on exertion, edema or irregular heartbeat Gastrointestinal ROS: abdominal pain, diarrhea Genito-Urinary ROS: negative for - dysuria, hematuria, incontinence or urinary frequency/urgency Musculoskeletal ROS: pain Neurological ROS: as noted in HPI Dermatological ROS: negative for rash and skin lesion changes  Physical Examination: Blood pressure (!) 141/58, pulse 79, temperature 98.6 F (37 C), temperature source Oral, resp. rate 18, height 5\' 8"  (1.727 m), weight 79.2 kg (174 lb 9.7 oz), SpO2 96 %.  HEENT-  Normocephalic, no lesions, without obvious abnormality.  Normal external eye and conjunctiva.  Normal TM's bilaterally.  Normal auditory canals and external ears. Normal  external nose, mucus membranes and septum.  Normal pharynx. Cardiovascular- S1, S2 normal, pulses palpable throughout   Lungs- chest clear, no wheezing, rales, normal symmetric air entry Abdomen- soft, non-tender; bowel sounds normal; no masses,  no organomegaly Extremities- no edema Lymph-no adenopathy palpable Musculoskeletal-no joint tenderness, deformity or swelling Skin-warm and dry, no hyperpigmentation, vitiligo, or suspicious lesions  Neurological Examination   Mental Status: Awake but agitated.  Able to recognize his daughter but does not follow commands.  Gives one word answers in Spanish at times but mostly just groaning.  . Cranial Nerves: II: Patient not cooperative for pupils to be evaluated.   III,IV, VI: oculocephalic maneuvers intact V,VII: face symmetric VIII: hearing normal bilaterally IX,X: unable to test XI: unable to test XII: unable to test Motor: Moves all extremities strongly against gravity with no focal weakness noted Sensory: unable to test Deep Tendon Reflexes: Symmetric throughout Plantars: Right: downgoing   Left: downgoing Cerebellar: unable to test Gait: not tested due to safety concerns   Laboratory Studies:   Basic Metabolic Panel:  Recent Labs Lab  03/26/17 2028 03/27/17 0556 03/28/17 1800 03/29/17 0521  NA 138 140 140 140  141  K 4.3 4.2 3.5 3.2*  3.3*  CL 116* 118* 110 104  105  CO2 12* 11* 18* 27  28  GLUCOSE 126* 97 99 110*  111*  BUN 97* 106* 114* 75*  76*  CREATININE 11.51* 11.83* 13.21* 9.47*  9.75*  CALCIUM 9.0 8.5* 7.8* 7.4*  7.3*  PHOS  --   --  5.7* 4.9*    Liver Function Tests:  Recent Labs Lab 03/26/17 2028 03/28/17 1800 03/29/17 0521  AST 20 21  --   ALT 14* 9*  --   ALKPHOS 98 74  --   BILITOT 0.5 0.6  --   PROT 7.0 5.0*  --   ALBUMIN 3.3* 2.3* 2.2*    Recent Labs Lab 03/26/17 2028  LIPASE 159*    Recent Labs Lab 03/29/17 1326  AMMONIA 16    CBC:  Recent Labs Lab 03/26/17 2028 03/27/17 0556 03/28/17 0640 03/28/17 1800 03/29/17 0521  WBC 12.2* 11.4* 13.9* 12.9* 11.3*  HGB 10.4* 9.0* 9.1* 8.9* 7.7*  HCT 32.1* 28.2* 28.2* 26.8* 23.3*  MCV 95.1 94.2 94.3 93.2 91.2  PLT 272 238 226 221 191    Cardiac Enzymes:  Recent Labs Lab 03/26/17 2028 03/27/17 0058 03/27/17 0556 03/27/17 1214 03/27/17 1732  TROPONINI 0.12* 0.08* 0.05* 0.08* 0.08*    BNP: Invalid input(s): POCBNP  CBG:  Recent Labs Lab 03/28/17 0733 03/29/17 0656 03/30/17 0806  GLUCAP 114* 105* 88    Microbiology: Results for orders placed or performed during the hospital encounter of 03/26/17  C difficile quick scan w PCR reflex     Status: Abnormal   Collection Time: 03/27/17  4:33 AM  Result Value Ref Range Status   C Diff antigen POSITIVE (A) NEGATIVE Final    Comment: CRITICAL RESULT CALLED TO, READ BACK BY AND VERIFIED WITH: ROBERT Martinique ON 03/27/17 AT605 BY TLB    C Diff toxin POSITIVE (A) NEGATIVE Final   C Diff interpretation Toxin producing C. difficile detected.  Final    Coagulation Studies: No results for input(s): LABPROT, INR in the last 72 hours.  Urinalysis:  Recent Labs Lab 03/27/17 0750  COLORURINE YELLOW*  LABSPEC 1.014  PHURINE 5.0  GLUCOSEU 50*  HGBUR NEGATIVE   BILIRUBINUR NEGATIVE  KETONESUR NEGATIVE  PROTEINUR 100*  NITRITE NEGATIVE  LEUKOCYTESUR LARGE*  Lipid Panel:  No results found for: CHOL, TRIG, HDL, CHOLHDL, VLDL, LDLCALC  HgbA1C:  Lab Results  Component Value Date   HGBA1C 5.2 02/15/2017    Urine Drug Screen:  No results found for: LABOPIA, COCAINSCRNUR, LABBENZ, AMPHETMU, THCU, LABBARB  Alcohol Level: No results for input(s): ETH in the last 168 hours.  Other results: EKG:  sinus rhythm at 95 bpm.  Imaging: Ct Abdomen Pelvis Wo Contrast  Result Date: 03/29/2017 CLINICAL DATA:  76 year old male presenting with diarrhea. Increasing lethargy and altered mental status. Acute renal failure. EXAM: CT ABDOMEN AND PELVIS WITHOUT CONTRAST TECHNIQUE: Multidetector CT imaging of the abdomen and pelvis was performed following the standard protocol without IV contrast. COMPARISON:  CT Abdomen and Pelvis 03/27/2017 and earlier. FINDINGS: Lower chest: Small layering pleural effusions are new from 2 days ago. New bilateral lower lobe peribronchial opacity greater on the left. Mildly lower lung volumes. Stable cardiac size. No pericardial effusion. Calcified coronary artery atherosclerosis. Hepatobiliary: Nodular contour again noted. Stable mildly distended gallbladder. No discrete liver lesion in the absence of IV contrast. Pancreas: Negative. Spleen: Negative, no splenomegaly. Adrenals/Urinary Tract: Negative adrenal glands. Stable noncontrast kidneys with no hydronephrosis or hydroureter. Mild calcified renal artery atherosclerosis on the right. Negative course of both ureters. Unremarkable urinary bladder. No abdominal free fluid.  No free air. Stomach/Bowel: Fluid in the rectum. Similar appearance of mild wall thickening in the rectosigmoid colon. Redundant sigmoid. Increased sigmoid mesenteric stranding. Similar appearance of mild indistinctness and wall thickening in the distal left colon. Increased mild pericolic gutter stranding. The splenic  flexure, transverse and right colon have a more normal appearance although are intermittently fluid-filled. Negative appendix. No dilated small bowel. Negative stomach except for possible small sliding-type hiatal hernia. Negative duodenum. Vascular/Lymphatic: Right femoral vein approach dual lumen central catheter terminates in the distal IVC. Mild Calcified aortic atherosclerosis. Vascular patency is not evaluated in the absence of IV contrast. Reproductive: Stable prostate hypertrophy. Other: Trace pelvic free fluid and increased presacral stranding. Musculoskeletal: No acute osseous abnormality identified. IMPRESSION: 1. New small bilateral pleural effusions and increased bilateral lower lobe peribronchial opacity which could reflect atelectasis or developing infection. 2. Mild generalized colitis again suspected, with interval increased inflammatory stranding in the sigmoid mesentery and new trace pelvic free fluid. 3. Right femoral vein approach dual lumen catheter terminates at the distal IVC. 4. Nodular liver again suspicious for cirrhosis. 5. Mild Calcified aortic atherosclerosis. Electronically Signed   By: Genevie Ann M.D.   On: 03/29/2017 15:16   Dg Chest 1 View  Result Date: 03/28/2017 CLINICAL DATA:  Infectious colitis.  Abdominal pain. EXAM: CHEST 1 VIEW COMPARISON:  02/17/2017 FINDINGS: Chronic mild cardiomegaly. Artifact overlies the chest. The right chest appears clear. There is worsened atelectasis in the left lower lobe. Possible small left effusion. IMPRESSION: Atelectasis in the left lower lobe.  Possible small left effusion. Electronically Signed   By: Nelson Chimes M.D.   On: 03/28/2017 13:51   Ct Head Wo Contrast  Result Date: 03/29/2017 CLINICAL DATA:  Increased lethargy.  Altered mental status. EXAM: CT HEAD WITHOUT CONTRAST TECHNIQUE: Contiguous axial images were obtained from the base of the skull through the vertex without intravenous contrast. COMPARISON:  02/12/2016 FINDINGS:  Brain: Mild chronic ischemic changes in the periventricular white matter. Global atrophy. No mass effect, midline shift, or acute hemorrhage. Vascular: No hyperdense vessel or unexpected calcification. Skull: No acute fracture.  No destructive bone lesion. Sinuses/Orbits: Minimal mucosal thickening in the maxillary sinuses. Mastoid air  cells clear. Other: None. IMPRESSION: No acute intracranial pathology. Electronically Signed   By: Marybelle Killings M.D.   On: 03/29/2017 11:41     Assessment/Plan: 76 year old male with altered mental status.  Admission BUN/Cr elevated above baseline at 97/11.51 to a max of 114/13.21 on 4/13.  Now at 76/9.75.  Patient cognitive function may be lagging behind the numbers with the patient having altered mental status due to uremia.  Head CT reviewed and was unremarkable. Neurological examination is nonfocal.  Due to poor intake over the past few months can not rule out a nutritional deficiency as well.  Further work up recommended.  Recommendations: 1. B1, B12, folate, TSH 2. Agree with continued addressing of renal function. 3.  IV thiamine today 4.  Will continue to follow with you.    Alexis Goodell, MD Neurology [phone] 03/30/2017, 11:44 AM

## 2017-03-30 NOTE — Progress Notes (Signed)
Dr Ara Kussmaul made aware of family refusing Morphine and requesting different medication.  Informed that Morphine would be best med for pain and to prevent oversedation and to educate family.  Different family members present on return to room.  Morphine offered and given.

## 2017-03-30 NOTE — Progress Notes (Signed)
IV Morphine medication order received from MD.  Pt granddaughter in room and refuses Morphine.  States med did not work for patient earlier today.  Pt remains agitated in bed.

## 2017-03-30 NOTE — Plan of Care (Signed)
Patient appear to be sleeping, family at bedside.

## 2017-03-30 NOTE — Progress Notes (Signed)
Have attempted to insert NG tube as per order with staff and family assistance, patient become very agitated,  patient very combative, screaming and moving his head violently, I stop afraid that patient might hurt himself, MD notified

## 2017-03-30 NOTE — Progress Notes (Signed)
[facility] [place] NAME: Martin Gilbert    MR#:  [id number]  DATE OF BIRTH:  [birth year]  SUBJECTIVE: Treated for C. difficile colitis, worsening renal function, started on hemodialysis, patient  Had 2  HDsessions so far. Continues to be lethargic for the last 2 days, workup has been negative including head CT, ABG, ammonia levels. Patient moaning as if he is in pain and family says that he is mainly complaining of pain around prostate area. No fever or hypoxia.   CHIEF COMPLAINT:   Chief Complaint  Patient presents with  . Abdominal Pain  . Diarrhea    REVIEW OF SYSTEMS:    Review of Systems  Unable to perform ROS: Acuity of condition    Nutrition:  not Tolerating Diet: Tolerating PT:      DRUG ALLERGIES:  No Known Allergies  VITALS:  Blood pressure (!) 141/58, pulse 79, temperature 98.6 F (37 C), temperature source Oral, resp. rate 18, height 5\' 8"  (1.727 m), weight 79.2 kg (174 lb 9.7 oz), SpO2 96 %.  PHYSICAL EXAMINATION:   Physical Exam  GENERAL:  76 y.o.-year-old patient lying in the bed Moaning.  EYES: Pupils equal, round, reactive to light No scleral icterus. Extraocular muscles intact.  HEENT: Head atraumatic, normocephalic.  Oropharynx and nasopharynx clear.  NECK:  Supple, no jugular venous distention. No thyroid enlargement, no tenderness.  LUNGS: Normal breath sounds bilaterally, no wheezing, rales,rhonchi or crepitation. No use of accessory muscles of respiration.  CARDIOVASCULAR: S1, S2 normal. No murmurs, rubs, or gallops.  ABDOMEN: grinning to palpating left side of abdomen EXTREMITIES: No pedal edema, cyanosis, or clubbing.  NEUROLOGIC: Lethargic today PSYCHIATRIC:  lethargic.  SKIN: No obvious rash, lesion, or ulcer.    LABORATORY PANEL:   CBC  Recent Labs Lab 03/29/17 0521  WBC 11.3*  HGB 7.7*  HCT 23.3*  PLT 191    ------------------------------------------------------------------------------------------------------------------  Chemistries   Recent Labs Lab 03/28/17 1800 03/29/17 0521  NA 140 140  141  K 3.5 3.2*  3.3*  CL 110 104  105  CO2 18* 27  28  GLUCOSE 99 110*  111*  BUN 114* 75*  76*  CREATININE 13.21* 9.47*  9.75*  CALCIUM 7.8* 7.4*  7.3*  AST 21  --   ALT 9*  --   ALKPHOS 74  --   BILITOT 0.6  --    ------------------------------------------------------------------------------------------------------------------  Cardiac Enzymes  Recent Labs Lab 03/27/17 1732  TROPONINI 0.08*   ------------------------------------------------------------------------------------------------------------------  RADIOLOGY:  Ct Abdomen Pelvis Wo Contrast  Result Date: 03/29/2017 CLINICAL DATA:  76 year old male presenting with diarrhea. Increasing lethargy and altered mental status. Acute renal failure. EXAM: CT ABDOMEN AND PELVIS WITHOUT CONTRAST TECHNIQUE: Multidetector CT imaging of the abdomen and pelvis was performed following the standard protocol without IV contrast. COMPARISON:  CT Abdomen and Pelvis 03/27/2017 and earlier. FINDINGS: Lower chest: Small layering pleural effusions are new from 2 days ago. New bilateral lower lobe peribronchial opacity greater on the left. Mildly lower lung volumes. Stable cardiac size. No pericardial effusion. Calcified coronary artery atherosclerosis. Hepatobiliary: Nodular contour again noted. Stable mildly distended gallbladder. No discrete liver lesion in the absence of IV contrast. Pancreas: Negative. Spleen: Negative, no splenomegaly. Adrenals/Urinary Tract: Negative adrenal glands. Stable noncontrast kidneys with no hydronephrosis or hydroureter. Mild calcified renal artery atherosclerosis on the right. Negative course of both ureters. Unremarkable urinary bladder. No abdominal free fluid.  No free air. Stomach/Bowel: Fluid in the rectum.  Similar appearance of mild wall thickening in the rectosigmoid colon. Redundant sigmoid. Increased sigmoid mesenteric stranding. Similar appearance of mild indistinctness and wall thickening in the distal left colon. Increased mild pericolic gutter stranding. The splenic flexure, transverse and right colon have a more normal appearance although are intermittently fluid-filled. Negative appendix. No dilated small bowel. Negative stomach except for possible small sliding-type hiatal hernia. Negative duodenum. Vascular/Lymphatic: Right femoral vein approach dual lumen central catheter terminates in the distal IVC. Mild Calcified aortic atherosclerosis. Vascular patency is not evaluated in the absence of IV contrast. Reproductive: Stable prostate hypertrophy. Other: Trace pelvic free fluid and increased presacral stranding. Musculoskeletal: No acute osseous abnormality identified. IMPRESSION: 1. New small bilateral pleural effusions and increased bilateral lower lobe peribronchial opacity which could reflect atelectasis or developing infection. 2. Mild generalized colitis again suspected, with interval increased inflammatory stranding in the sigmoid mesentery and new trace pelvic free fluid. 3. Right femoral vein approach dual lumen catheter terminates at the distal IVC. 4. Nodular liver again suspicious for cirrhosis. 5. Mild Calcified aortic atherosclerosis. Electronically Signed   By: Genevie Ann M.D.   On: 03/29/2017 15:16   Dg Chest 1 View  Result Date: 03/28/2017 CLINICAL DATA:  Infectious colitis.  Abdominal pain. EXAM: CHEST 1 VIEW COMPARISON:  02/17/2017 FINDINGS: Chronic mild cardiomegaly. Artifact overlies the chest. The right chest appears clear. There is worsened atelectasis in the left lower lobe. Possible small left effusion. IMPRESSION: Atelectasis in the left lower lobe.  Possible small left effusion. Electronically Signed   By: Nelson Chimes M.D.   On: 03/28/2017 13:51   Ct Head Wo Contrast  Result  Date: 03/29/2017 CLINICAL DATA:  Increased lethargy.  Altered mental status. EXAM: CT HEAD WITHOUT CONTRAST TECHNIQUE: Contiguous axial images were obtained from the base of the skull through the vertex without intravenous contrast. COMPARISON:  02/12/2016 FINDINGS: Brain: Mild chronic ischemic changes in the periventricular white matter. Global atrophy. No mass effect, midline shift, or acute hemorrhage. Vascular: No hyperdense vessel or unexpected calcification. Skull: No acute fracture.  No destructive bone lesion. Sinuses/Orbits: Minimal mucosal thickening in the maxillary sinuses. Mastoid air cells clear. Other: None. IMPRESSION: No acute intracranial pathology. Electronically Signed   By: Marybelle Killings M.D.   On: 03/29/2017 11:41     ASSESSMENT AND PLAN:   Active Problems:   Anemia of chronic disease   Colitis   Demand ischemia ([place])   ARF (acute renal failure) ([name])   Enteritis due to Clostridium difficile   #1 .recurrent C. difficile colitis.; Ordered IV Flagyl yesterday due to lethargy and unable to give by mouth vancomycin. Today we will insert NG tube, start by mouth vancomycin also. Discussed with patient's family through [place] interpreter.   #2 lethargy;.at least multifactorial due to infection, renal failure, multiple medical problems with diabetes ,may ne underlying nutritional deficiency. Spoke with neurology, order thiamine and folic acid levels and also start IV thiamine today.  #3 essential hypertension:due to lethargy hold all po meds.  #4 diabetes mellitus type 2 ; hold the insulin at this time because of lethargy and poor by mouth intake.   #5 prognosis poor, explained to family that he is really sick. Used hospital provided through Spanish interpreter/ #6 possible BPH: Ordered Foley catheter, urology consult will be placed.  6 agitation: Use Risperdal.  All the records are reviewed and case discussed with Care Management/Social Workerr. Management plans discussed  with the patient, family and they are in agreement.  CODE STATUS: full  TOTAL TIME TAKING CARE OF THIS PATIENT: 35 minutes.   POSSIBLE D/C IN 7 DAYS, DEPENDING ON CLINICAL CONDITION.   Epifanio Lesches M.D on 03/30/2017 at 11:57 AM  Between 7am to 6pm - Pager - [phone]  After 6pm go to [email or web address] - password [email or web address] [facility]  Office  [phone]  CC: Primary care physician; [name], MD

## 2017-03-30 NOTE — Progress Notes (Signed)
[facility]   Right groin temporary dialysis cathter seems to be working well.  If permcath is needed please re-consult.  If patient would like to follow up with [place] as outpatient (usually seen at [facility]) please make an appointment for vein mapping in our clinic.   Marcelle Overlie PA-C 03/30/2017 3:11 PM

## 2017-03-30 NOTE — Plan of Care (Signed)
Problem: Safety: Goal: Ability to remain free from injury will improve Outcome: Not Progressing Family at bedside this shift.  Pt remains agitated and constantly moaning.  Slept for short interval after IV Morphine given.

## 2017-03-30 NOTE — Progress Notes (Signed)
Pt remains agitated, thrashing about in bed.  Follows no commands.  Family requesting med be given for possible pain.  Explained morphine ordered and offered earlier but family declined.  Family requesting patient be "put to sleep".  Explained that meds to sedate will not be given since patient lethargic earlier.  Continues to request med.  MD to be notified.

## 2017-03-30 NOTE — Progress Notes (Signed)
[facility]  ROUNDING NOTE   Subjective:   History taken from family through [place].  Patient continues to be confused. Complains of pain and diarrhea.   Switched to IV metronidazole due from PO vancomycin.   Second hemodialysis treatment yesterday. Tolerated treatment well. Third treatment for tomorrow.   Objective:  Vital signs in last 24 hours:  Temp:  [98.6 F (37 C)-98.9 F (37.2 C)] 98.6 F (37 C) (04/15 0447) Pulse Rate:  [79-97] 79 (04/15 0447) Resp:  [16-18] 18 (04/15 0447) BP: (141-179)/(58-91) 141/58 (04/15 0447) SpO2:  [92 %-96 %] 96 % (04/15 0447)  Weight change: 1.3 kg (2 lb 13.9 oz) Filed Weights   03/28/17 2038 03/29/17 0523 03/29/17 0838  Weight: 77.9 kg (171 lb 11.8 oz) 79.3 kg (174 lb 13.2 oz) 79.2 kg (174 lb 9.7 oz)    Intake/Output: I/O last 3 completed shifts: In: 1123 [I.V.:823; IV Piggyback:300] Out: -925 [Urine:225]   Intake/Output this shift:  No intake/output data recorded.  Physical Exam: General: Ill appearing  Head: Normocephalic, atraumatic. Moist oral mucosal membranes  Eyes: Anicteric, PERRL  Neck: Supple, trachea midline  Lungs:  Clear to auscultation  Heart: Regular rate and rhythm  Abdomen:  Soft, mild tenderness  Extremities:  no peripheral edema.  Neurologic: Unable to answer questions or follow commands, moving all four extremities  Skin: No lesions  Access: Right femoral temp HD catheter 4/13 Dr. Lucky Cowboy    Basic Metabolic Panel:  Recent Labs Lab 03/26/17 2028 03/27/17 0556 03/28/17 1800 03/29/17 0521  NA 138 140 140 140  141  K 4.3 4.2 3.5 3.2*  3.3*  CL 116* 118* 110 104  105  CO2 12* 11* 18* 27  28  GLUCOSE 126* 97 99 110*  111*  BUN 97* 106* 114* 75*  76*  CREATININE 11.51* 11.83* 13.21* 9.47*  9.75*  CALCIUM 9.0 8.5* 7.8* 7.4*  7.3*  PHOS  --   --  5.7* 4.9*    Liver Function Tests:  Recent Labs Lab 03/26/17 2028 03/28/17 1800 03/29/17 0521  AST 20 21  --   ALT  14* 9*  --   ALKPHOS 98 74  --   BILITOT 0.5 0.6  --   PROT 7.0 5.0*  --   ALBUMIN 3.3* 2.3* 2.2*    Recent Labs Lab 03/26/17 2028  LIPASE 159*    Recent Labs Lab 03/29/17 1326  AMMONIA 16    CBC:  Recent Labs Lab 03/26/17 2028 03/27/17 0556 03/28/17 0640 03/28/17 1800 03/29/17 0521  WBC 12.2* 11.4* 13.9* 12.9* 11.3*  HGB 10.4* 9.0* 9.1* 8.9* 7.7*  HCT 32.1* 28.2* 28.2* 26.8* 23.3*  MCV 95.1 94.2 94.3 93.2 91.2  PLT 272 238 226 221 191    Cardiac Enzymes:  Recent Labs Lab 03/26/17 2028 03/27/17 0058 03/27/17 0556 03/27/17 1214 03/27/17 1732  TROPONINI 0.12* 0.08* 0.05* 0.08* 0.08*    BNP: Invalid input(s): POCBNP  CBG:  Recent Labs Lab 03/28/17 0733 03/29/17 0656 03/30/17 0806  GLUCAP 114* 105* 79    Microbiology: Results for orders placed or performed during the hospital encounter of 03/26/17  C difficile quick scan w PCR reflex     Status: Abnormal   Collection Time: 03/27/17  4:33 AM  Result Value Ref Range Status   C Diff antigen POSITIVE (A) NEGATIVE Final    Comment: CRITICAL RESULT CALLED TO, READ BACK BY AND VERIFIED WITH: ROBERT Martinique ON 03/27/17 AT605 BY TLB    C Diff toxin POSITIVE (  A) NEGATIVE Final   C Diff interpretation Toxin producing C. difficile detected.  Final    Coagulation Studies: No results for input(s): LABPROT, INR in the last 72 hours.  Urinalysis: No results for input(s): COLORURINE, LABSPEC, PHURINE, GLUCOSEU, HGBUR, BILIRUBINUR, KETONESUR, PROTEINUR, UROBILINOGEN, NITRITE, LEUKOCYTESUR in the last 72 hours.  Invalid input(s): APPERANCEUR    Imaging: Ct Abdomen Pelvis Wo Contrast  Result Date: 03/29/2017 CLINICAL DATA:  76 year old male presenting with diarrhea. Increasing lethargy and altered mental status. Acute renal failure. EXAM: CT ABDOMEN AND PELVIS WITHOUT CONTRAST TECHNIQUE: Multidetector CT imaging of the abdomen and pelvis was performed following the standard protocol without IV contrast.  COMPARISON:  CT Abdomen and Pelvis 03/27/2017 and earlier. FINDINGS: Lower chest: Small layering pleural effusions are new from 2 days ago. New bilateral lower lobe peribronchial opacity greater on the left. Mildly lower lung volumes. Stable cardiac size. No pericardial effusion. Calcified coronary artery atherosclerosis. Hepatobiliary: Nodular contour again noted. Stable mildly distended gallbladder. No discrete liver lesion in the absence of IV contrast. Pancreas: Negative. Spleen: Negative, no splenomegaly. Adrenals/Urinary Tract: Negative adrenal glands. Stable noncontrast kidneys with no hydronephrosis or hydroureter. Mild calcified renal artery atherosclerosis on the right. Negative course of both ureters. Unremarkable urinary bladder. No abdominal free fluid.  No free air. Stomach/Bowel: Fluid in the rectum. Similar appearance of mild wall thickening in the rectosigmoid colon. Redundant sigmoid. Increased sigmoid mesenteric stranding. Similar appearance of mild indistinctness and wall thickening in the distal left colon. Increased mild pericolic gutter stranding. The splenic flexure, transverse and right colon have a more normal appearance although are intermittently fluid-filled. Negative appendix. No dilated small bowel. Negative stomach except for possible small sliding-type hiatal hernia. Negative duodenum. Vascular/Lymphatic: Right femoral vein approach dual lumen central catheter terminates in the distal IVC. Mild Calcified aortic atherosclerosis. Vascular patency is not evaluated in the absence of IV contrast. Reproductive: Stable prostate hypertrophy. Other: Trace pelvic free fluid and increased presacral stranding. Musculoskeletal: No acute osseous abnormality identified. IMPRESSION: 1. New small bilateral pleural effusions and increased bilateral lower lobe peribronchial opacity which could reflect atelectasis or developing infection. 2. Mild generalized colitis again suspected, with interval  increased inflammatory stranding in the sigmoid mesentery and new trace pelvic free fluid. 3. Right femoral vein approach dual lumen catheter terminates at the distal IVC. 4. Nodular liver again suspicious for cirrhosis. 5. Mild Calcified aortic atherosclerosis. Electronically Signed   By: Genevie Ann M.D.   On: 03/29/2017 15:16   Dg Chest 1 View  Result Date: 03/28/2017 CLINICAL DATA:  Infectious colitis.  Abdominal pain. EXAM: CHEST 1 VIEW COMPARISON:  02/17/2017 FINDINGS: Chronic mild cardiomegaly. Artifact overlies the chest. The right chest appears clear. There is worsened atelectasis in the left lower lobe. Possible small left effusion. IMPRESSION: Atelectasis in the left lower lobe.  Possible small left effusion. Electronically Signed   By: Nelson Chimes M.D.   On: 03/28/2017 13:51   Ct Head Wo Contrast  Result Date: 03/29/2017 CLINICAL DATA:  Increased lethargy.  Altered mental status. EXAM: CT HEAD WITHOUT CONTRAST TECHNIQUE: Contiguous axial images were obtained from the base of the skull through the vertex without intravenous contrast. COMPARISON:  02/12/2016 FINDINGS: Brain: Mild chronic ischemic changes in the periventricular white matter. Global atrophy. No mass effect, midline shift, or acute hemorrhage. Vascular: No hyperdense vessel or unexpected calcification. Skull: No acute fracture.  No destructive bone lesion. Sinuses/Orbits: Minimal mucosal thickening in the maxillary sinuses. Mastoid air cells clear. Other: None. IMPRESSION: No acute intracranial  pathology. Electronically Signed   By: Marybelle Killings M.D.   On: 03/29/2017 11:41     Medications:   . sodium chloride    . dextrose 5 % and 0.45 % NaCl with KCl 20 mEq/L 20 mL/hr at 03/29/17 1600   . amLODipine  2.5 mg Oral Daily  . aspirin EC  81 mg Oral Daily  . atorvastatin  20 mg Oral Daily  . baclofen  10 mg Oral TID  . chlorhexidine  15 mL Mouth Rinse BID  . doxazosin  2 mg Oral QHS  . feeding supplement (GLUCERNA SHAKE)  237  mL Oral TID BM  . ferrous sulfate  325 mg Oral Q breakfast  . hydrALAZINE  50 mg Oral TID  . mouth rinse  15 mL Mouth Rinse q12n4p  . metoprolol succinate  25 mg Oral Daily  . metronidazole  500 mg Intravenous Q8H  . multivitamin with minerals  1 tablet Oral Daily  . pantoprazole  40 mg Oral Daily  . piperacillin-tazobactam (ZOSYN)  IV  3.375 g Intravenous Q12H  . polyvinyl alcohol  1 drop Both Eyes BID  . sodium bicarbonate  650 mg Oral BID  . tamsulosin  0.4 mg Oral QPC supper   acetaminophen **OR** acetaminophen, gabapentin, morphine injection, ondansetron (ZOFRAN) IV, ondansetron, promethazine  Assessment/ Plan:  [name] is a 76 y.o. Hispanic malewith diabetes mellitus type II, hypertension, hepatic cirrhosis, BPH, gout, diabetic neuropathy  1. End Stage Renal Disease: with proteinuria, metabolic acidosis and hypoalbuminemia.  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 Baseline creatinine of 6.23, GFR of 8 on 02/11/17. Followed by Dr. Juanito Doom, [facility].  - Place foley catheter for accurate urine assessment.  - Started on hemodialysis on 4/13.  Plan for dialysis outpatient at [place].   2. Hypertension: blood pressure at goal Home regimen of amlodipine, furosemide, hydralazine, and metoprolol. Not on ACE-I/ARB due to advanced renal disease.   3. Diabetes mellitus type II with chronic kidney disease: insulin dependent  4. Anemia of chronic kidney disease: gets outpatient [facility].  - EPO with HD   5. Colitis: failure outpatient therapy for c. Diff colitis. Seems patient stopped taking his antibiotics.  - appreciate GI input and ID input.  - IV metronidazole.   6. Encephalopathy: concerning for uremia. However has completed two treatments of dialysis.  - Appreciate neurology input.    LOS: 3 Martin Gilbert 4/15/201811:29 AM

## 2017-03-30 NOTE — Progress Notes (Signed)
14 Fr foley catheter inserted as per order, patient tolerated, family and family  educated about need for foley catheter via an interpreter .  Patient appear to be calm at this time after receiving 1 mg ativan as per order

## 2017-03-30 NOTE — Progress Notes (Signed)
Pharmacy Antibiotic Note  Martin Gilbert is a 76 y.o. male admitted on 03/26/2017 with pneumonia.  Pharmacy has been consulted for Zosyn dosing.  Plan: Zosyn 3.375gm q12hr (patient on hemodialysis)  Height: 5\' 8"  (172.7 cm) Weight: 174 lb 9.7 oz (79.2 kg) IBW/kg (Calculated) : 68.4  Temp (24hrs), Avg:98.7 F (37.1 C), Min:98.6 F (37 C), Max:98.9 F (37.2 C)   Recent Labs Lab 03/26/17 2028 03/27/17 0014 03/27/17 0556 03/28/17 0640 03/28/17 1800 03/29/17 0521 03/29/17 1326 03/29/17 1642  WBC 12.2*  --  11.4* 13.9* 12.9* 11.3*  --   --   CREATININE 11.51*  --  11.83*  --  13.21* 9.47*  9.75*  --   --   LATICACIDVEN  --  0.9  --   --   --   --  1.3 1.4    Estimated Creatinine Clearance: 6.4 mL/min (A) (by C-G formula based on SCr of 9.47 mg/dL (H)).    No Known Allergies   Thank you for allowing pharmacy to be a part of this patient's care.  Tollie Canada K 03/30/2017 10:31 AM

## 2017-03-31 LAB — RENAL FUNCTION PANEL
Albumin: 2.1 g/dL — ABNORMAL LOW (ref 3.5–5.0)
Anion gap: 11 (ref 5–15)
BUN: 56 mg/dL — AB (ref 6–20)
CHLORIDE: 104 mmol/L (ref 101–111)
CO2: 27 mmol/L (ref 22–32)
CREATININE: 8.71 mg/dL — AB (ref 0.61–1.24)
Calcium: 7.3 mg/dL — ABNORMAL LOW (ref 8.9–10.3)
GFR calc Af Amer: 6 mL/min — ABNORMAL LOW (ref >60)
GFR calc non Af Amer: 5 mL/min — ABNORMAL LOW (ref >60)
Glucose, Bld: 72 mg/dL (ref 65–99)
Phosphorus: 4.5 mg/dL (ref 2.5–4.6)
Potassium: 3.1 mmol/L — ABNORMAL LOW (ref 3.5–5.1)
Sodium: 142 mmol/L (ref 135–145)

## 2017-03-31 LAB — CBC
HCT: 24.9 % — ABNORMAL LOW (ref 40.0–52.0)
Hemoglobin: 8.1 g/dL — ABNORMAL LOW (ref 13.0–18.0)
MCH: 30.1 pg (ref 26.0–34.0)
MCHC: 32.5 g/dL (ref 32.0–36.0)
MCV: 92.6 fL (ref 80.0–100.0)
PLATELETS: 204 10*3/uL (ref 150–440)
RBC: 2.69 MIL/uL — ABNORMAL LOW (ref 4.40–5.90)
RDW: 14.3 % (ref 11.5–14.5)
WBC: 11.7 10*3/uL — ABNORMAL HIGH (ref 3.8–10.6)

## 2017-03-31 NOTE — Progress Notes (Signed)
[facility] [place] NAME: Martin Gilbert    MR#:  [id number]  DATE OF BIRTH:  February 19, 1941  SUBJECTIVE: slightly more alert,awake.asking for food.   CHIEF COMPLAINT:   Chief Complaint  Patient presents with  . Abdominal Pain  . Diarrhea    REVIEW OF SYSTEMS:    Review of Systems  Unable to perform ROS: Acuity of condition    Nutrition:  not Tolerating Diet: Tolerating PT:      DRUG ALLERGIES:  No Known Allergies  VITALS:  Blood pressure (!) 160/96, pulse 88, temperature 98.2 F (36.8 C), temperature source Axillary, resp. rate 18, height 5\' 8"  (1.727 m), weight 79.7 kg (175 lb 11.3 oz), SpO2 99 %.  PHYSICAL EXAMINATION:   Physical Exam  GENERAL:  76 y.o.-year-old patient lying in the bed Moaning.  EYES: Pupils equal, round, reactive to light No scleral icterus. Extraocular muscles intact.  HEENT: Head atraumatic, normocephalic.  Oropharynx and nasopharynx clear.  NECK:  Supple, no jugular venous distention. No thyroid enlargement, no tenderness.  LUNGS: Normal breath sounds bilaterally, no wheezing, rales,rhonchi or crepitation. No use of accessory muscles of respiration.  CARDIOVASCULAR: S1, S2 normal. No murmurs, rubs, or gallops.  ABDOMEN: grinning to palpating left side of abdomen EXTREMITIES: No pedal edema, cyanosis, or clubbing.  NEUROLOGIC:more alert,awake. PSYCHIATRIC:  More alert.  SKIN: No obvious rash, lesion, or ulcer.    LABORATORY PANEL:   CBC  Recent Labs Lab 03/31/17 1030  WBC 11.7*  HGB 8.1*  HCT 24.9*  PLT 204   ------------------------------------------------------------------------------------------------------------------  Chemistries   Recent Labs Lab 03/28/17 1800  03/31/17 1030  NA 140  < > 142  K 3.5  < > 3.1*  CL 110  < > 104  CO2 18*  < > 27  GLUCOSE 99  < > 72  BUN 114*  < > 56*  CREATININE 13.21*  < > 8.71*  CALCIUM 7.8*  < > 7.3*  AST 21  --   --    ALT 9*  --   --   ALKPHOS 74  --   --   BILITOT 0.6  --   --   < > = values in this interval not displayed. ------------------------------------------------------------------------------------------------------------------  Cardiac Enzymes  Recent Labs Lab 03/27/17 1732  TROPONINI 0.08*   ------------------------------------------------------------------------------------------------------------------  RADIOLOGY:  No results found.   ASSESSMENT AND PLAN:   Active Problems:   Anemia of chronic disease   Colitis   Demand ischemia ([place])   ARF (acute renal failure) ([name])   Enteritis due to Clostridium difficile   #1 .recurrent C. difficile colitis.; Ordered IV Flagyl yesterday due to lethargy and unable to give by mouth vancomycin.could not insert NG tube yesterday due  To agitation.. Discussed with patient's family through [place] interpreter.   #2 lethargy;.at least multifactorial due to infection, renal failure, multiple medical problems with diabetes ,may ne underlying nutritional deficiency. Spoke with neurology, order thiamine and folic acid levels.started thiamine,folic acid more alert.  #3 essential hypertension:speech eval today to start diet and po meds. #4 diabetes mellitus type 2 ; hold the insulin at this time because of lethargy and poor by mouth intake.   #5  pts daughter speaks good english and she translated.  Family happy with treatment and his overall improvement. #6 possible BPH: Foley draining clear urine.6 agitation: Use Risperdal.  All the records are reviewed and case discussed with Care Management/Social Workerr. Management plans discussed with the patient,  family and they are in agreement.  CODE STATUS: full  TOTAL TIME TAKING CARE OF THIS PATIENT: 35 minutes.   POSSIBLE D/C IN 7 DAYS, DEPENDING ON CLINICAL CONDITION.   Epifanio Lesches M.D on 03/31/2017 at 7:32 PM  Between 7am to 6pm - Pager - [phone]  After 6pm go to  [email or web address] - password [email or web address] [facility]  Office  [phone]  CC: Primary care physician; [name], MD

## 2017-03-31 NOTE — Progress Notes (Signed)
Subjective: Per family patient more cooperative today.  Appears to be doing somewhat better.    Objective: Current vital signs: BP (!) 157/63 (BP Location: Right Arm)   Pulse (P) 62   Temp 98.3 F (36.8 C) (Axillary)   Resp (P) 15   Ht 5\' 8"  (1.727 m)   Wt 79.7 kg (175 lb 11.3 oz)   SpO2 93%   BMI 26.72 kg/m  Vital signs in last 24 hours: Temp:  [97.5 F (36.4 C)-98.3 F (36.8 C)] 98.3 F (36.8 C) (04/16 1036) Pulse Rate:  [60-96] (P) 62 (04/16 1100) Resp:  [15-18] (P) 15 (04/16 1100) BP: (153-159)/(53-71) 157/63 (04/16 1043) SpO2:  [93 %-99 %] 93 % (04/16 1043) Weight:  [79.7 kg (175 lb 11.3 oz)] 79.7 kg (175 lb 11.3 oz) (04/16 1036)  Intake/Output from previous day: 04/15 0701 - 04/16 0700 In: 370 [I.V.:120; IV Piggyback:250] Out: 400 [Urine:400] Intake/Output this shift: No intake/output data recorded. Nutritional status: Diet NPO time specified  Neurologic Exam: Lethargic on dialysis.  No agitation noted.    Lab Results: Basic Metabolic Panel:  Recent Labs Lab 03/26/17 2028 03/27/17 0556 03/28/17 1800 03/29/17 0521  NA 138 140 140 140  141  K 4.3 4.2 3.5 3.2*  3.3*  CL 116* 118* 110 104  105  CO2 12* 11* 18* 27  28  GLUCOSE 126* 97 99 110*  111*  BUN 97* 106* 114* 75*  76*  CREATININE 11.51* 11.83* 13.21* 9.47*  9.75*  CALCIUM 9.0 8.5* 7.8* 7.4*  7.3*  PHOS  --   --  5.7* 4.9*    Liver Function Tests:  Recent Labs Lab 03/26/17 2028 03/28/17 1800 03/29/17 0521  AST 20 21  --   ALT 14* 9*  --   ALKPHOS 98 74  --   BILITOT 0.5 0.6  --   PROT 7.0 5.0*  --   ALBUMIN 3.3* 2.3* 2.2*    Recent Labs Lab 03/26/17 2028  LIPASE 159*    Recent Labs Lab 03/29/17 1326  AMMONIA 16    CBC:  Recent Labs Lab 03/26/17 2028 03/27/17 0556 03/28/17 0640 03/28/17 1800 03/29/17 0521  WBC 12.2* 11.4* 13.9* 12.9* 11.3*  HGB 10.4* 9.0* 9.1* 8.9* 7.7*  HCT 32.1* 28.2* 28.2* 26.8* 23.3*  MCV 95.1 94.2 94.3 93.2 91.2  PLT 272 238 226 221  191    Cardiac Enzymes:  Recent Labs Lab 03/26/17 2028 03/27/17 0058 03/27/17 0556 03/27/17 1214 03/27/17 1732  TROPONINI 0.12* 0.08* 0.05* 0.08* 0.08*    Lipid Panel: No results for input(s): CHOL, TRIG, HDL, CHOLHDL, VLDL, LDLCALC in the last 168 hours.  CBG:  Recent Labs Lab 03/28/17 0733 03/29/17 0656 03/30/17 0806  GLUCAP 114* 105* 88    Microbiology: Results for orders placed or performed during the hospital encounter of 03/26/17  C difficile quick scan w PCR reflex     Status: Abnormal   Collection Time: 03/27/17  4:33 AM  Result Value Ref Range Status   C Diff antigen POSITIVE (A) NEGATIVE Final    Comment: CRITICAL RESULT CALLED TO, READ BACK BY AND VERIFIED WITH: ROBERT Martinique ON 03/27/17 AT605 BY TLB    C Diff toxin POSITIVE (A) NEGATIVE Final   C Diff interpretation Toxin producing C. difficile detected.  Final    Coagulation Studies: No results for input(s): LABPROT, INR in the last 72 hours.  Imaging: Ct Abdomen Pelvis Wo Contrast  Result Date: 03/29/2017 CLINICAL DATA:  76 year old male presenting with diarrhea. Increasing  lethargy and altered mental status. Acute renal failure. EXAM: CT ABDOMEN AND PELVIS WITHOUT CONTRAST TECHNIQUE: Multidetector CT imaging of the abdomen and pelvis was performed following the standard protocol without IV contrast. COMPARISON:  CT Abdomen and Pelvis 03/27/2017 and earlier. FINDINGS: Lower chest: Small layering pleural effusions are new from 2 days ago. New bilateral lower lobe peribronchial opacity greater on the left. Mildly lower lung volumes. Stable cardiac size. No pericardial effusion. Calcified coronary artery atherosclerosis. Hepatobiliary: Nodular contour again noted. Stable mildly distended gallbladder. No discrete liver lesion in the absence of IV contrast. Pancreas: Negative. Spleen: Negative, no splenomegaly. Adrenals/Urinary Tract: Negative adrenal glands. Stable noncontrast kidneys with no hydronephrosis or  hydroureter. Mild calcified renal artery atherosclerosis on the right. Negative course of both ureters. Unremarkable urinary bladder. No abdominal free fluid.  No free air. Stomach/Bowel: Fluid in the rectum. Similar appearance of mild wall thickening in the rectosigmoid colon. Redundant sigmoid. Increased sigmoid mesenteric stranding. Similar appearance of mild indistinctness and wall thickening in the distal left colon. Increased mild pericolic gutter stranding. The splenic flexure, transverse and right colon have a more normal appearance although are intermittently fluid-filled. Negative appendix. No dilated small bowel. Negative stomach except for possible small sliding-type hiatal hernia. Negative duodenum. Vascular/Lymphatic: Right femoral vein approach dual lumen central catheter terminates in the distal IVC. Mild Calcified aortic atherosclerosis. Vascular patency is not evaluated in the absence of IV contrast. Reproductive: Stable prostate hypertrophy. Other: Trace pelvic free fluid and increased presacral stranding. Musculoskeletal: No acute osseous abnormality identified. IMPRESSION: 1. New small bilateral pleural effusions and increased bilateral lower lobe peribronchial opacity which could reflect atelectasis or developing infection. 2. Mild generalized colitis again suspected, with interval increased inflammatory stranding in the sigmoid mesentery and new trace pelvic free fluid. 3. Right femoral vein approach dual lumen catheter terminates at the distal IVC. 4. Nodular liver again suspicious for cirrhosis. 5. Mild Calcified aortic atherosclerosis. Electronically Signed   By: Genevie Ann M.D.   On: 03/29/2017 15:16   Ct Head Wo Contrast  Result Date: 03/29/2017 CLINICAL DATA:  Increased lethargy.  Altered mental status. EXAM: CT HEAD WITHOUT CONTRAST TECHNIQUE: Contiguous axial images were obtained from the base of the skull through the vertex without intravenous contrast. COMPARISON:  02/12/2016  FINDINGS: Brain: Mild chronic ischemic changes in the periventricular white matter. Global atrophy. No mass effect, midline shift, or acute hemorrhage. Vascular: No hyperdense vessel or unexpected calcification. Skull: No acute fracture.  No destructive bone lesion. Sinuses/Orbits: Minimal mucosal thickening in the maxillary sinuses. Mastoid air cells clear. Other: None. IMPRESSION: No acute intracranial pathology. Electronically Signed   By: Marybelle Killings M.D.   On: 03/29/2017 11:41    Medications:  I have reviewed the patient's current medications. Scheduled: . amLODipine  2.5 mg Oral Daily  . aspirin EC  81 mg Oral Daily  . atorvastatin  20 mg Oral Daily  . chlorhexidine  15 mL Mouth Rinse BID  . feeding supplement (GLUCERNA SHAKE)  237 mL Oral TID BM  . ferrous sulfate  325 mg Oral Q breakfast  . folic acid  1 mg Intravenous Daily  . hydrALAZINE  50 mg Oral TID  . mouth rinse  15 mL Mouth Rinse q12n4p  . metoprolol succinate  25 mg Oral Daily  . metronidazole  500 mg Intravenous Q8H  . multivitamin with minerals  1 tablet Oral Daily  . pantoprazole  40 mg Oral Daily  . piperacillin-tazobactam (ZOSYN)  IV  3.375 g Intravenous Q12H  .  polyvinyl alcohol  1 drop Both Eyes BID  . sodium bicarbonate  650 mg Oral BID  . tamsulosin  0.4 mg Oral QPC supper  . thiamine injection  100 mg Intravenous Daily    Assessment/Plan: Patient some improved.  B1 pending.  B12, TSH, and folate are normal.    Recommendations: 1.  Will continue to follow with you.   LOS: 4 days   Alexis Goodell, MD Neurology [phone] 03/31/2017  11:24 AM

## 2017-03-31 NOTE — Progress Notes (Signed)
POST HD TX

## 2017-03-31 NOTE — Progress Notes (Signed)
Paged Dr. Candiss Norse regarding nephrology permission to use triple lumen dialysis cath power injectable third lumen for IV abx and IV fluids. Dr. Candiss Norse verbalized it is okay to use third power lumen. Will continue to monitor.

## 2017-03-31 NOTE — Progress Notes (Signed)
[facility]  ROUNDING NOTE   Subjective:   Patient is still very lethargic Family- wife and daughter [name] at bedside Foley in place. UOP 400 cc recorded Patient seen during dialysis Tolerating well   HEMODIALYSIS FLOWSHEET:  Blood Flow Rate (mL/min): 300 mL/min Arterial Pressure (mmHg): -100 mmHg Venous Pressure (mmHg): 100 mmHg Transmembrane Pressure (mmHg): 60 mmHg Ultrafiltration Rate (mL/min): 30 mL/min Dialysate Flow Rate (mL/min): 600 ml/min Conductivity: Machine : 14 Conductivity: Machine : 14 Dialysis Fluid Bolus: Normal Saline Bolus Amount (mL): 250 mL Dialysate Change: Other (comment) (3K) Intra-Hemodialysis Comments: BFr increased w/o complications.     Objective:  Vital signs in last 24 hours:  Temp:  [97.5 F (36.4 C)-98.3 F (36.8 C)] 98.3 F (36.8 C) (04/16 1036) Pulse Rate:  [60-96] (P) 62 (04/16 1100) Resp:  [15-18] (P) 15 (04/16 1100) BP: (153-159)/(53-71) 157/63 (04/16 1043) SpO2:  [93 %-99 %] 93 % (04/16 1043) Weight:  [79.7 kg (175 lb 11.3 oz)] 79.7 kg (175 lb 11.3 oz) (04/16 1036)  Weight change:  Filed Weights   03/29/17 0523 03/29/17 0838 03/31/17 1036  Weight: 79.3 kg (174 lb 13.2 oz) 79.2 kg (174 lb 9.7 oz) 79.7 kg (175 lb 11.3 oz)    Intake/Output: I/O last 3 completed shifts: In: 793 [I.V.:343; IV Piggyback:450] Out: 400 [Urine:400]   Intake/Output this shift:  No intake/output data recorded.  Physical Exam: General: Ill appearing  Head: Normocephalic, atraumatic. Moist oral mucosal membranes  Eyes: Anicteric,   Neck: Supple, trachea midline  Lungs:  Clear to auscultation  Heart: Regular rate and rhythm  Abdomen:  Soft, mild tenderness  Extremities:  no peripheral edema.  Neurologic: Unable to answer questions or follow commands, moving all four extremities  Skin: No lesions  Access: Right femoral temp HD catheter 4/13 Dr. Lucky Cowboy    Basic Metabolic Panel:  Recent Labs Lab 03/26/17 2028 03/27/17 0556  03/28/17 1800 03/29/17 0521  NA 138 140 140 140  141  K 4.3 4.2 3.5 3.2*  3.3*  CL 116* 118* 110 104  105  CO2 12* 11* 18* 27  28  GLUCOSE 126* 97 99 110*  111*  BUN 97* 106* 114* 75*  76*  CREATININE 11.51* 11.83* 13.21* 9.47*  9.75*  CALCIUM 9.0 8.5* 7.8* 7.4*  7.3*  PHOS  --   --  5.7* 4.9*    Liver Function Tests:  Recent Labs Lab 03/26/17 2028 03/28/17 1800 03/29/17 0521  AST 20 21  --   ALT 14* 9*  --   ALKPHOS 98 74  --   BILITOT 0.5 0.6  --   PROT 7.0 5.0*  --   ALBUMIN 3.3* 2.3* 2.2*    Recent Labs Lab 03/26/17 2028  LIPASE 159*    Recent Labs Lab 03/29/17 1326  AMMONIA 16    CBC:  Recent Labs Lab 03/26/17 2028 03/27/17 0556 03/28/17 0640 03/28/17 1800 03/29/17 0521  WBC 12.2* 11.4* 13.9* 12.9* 11.3*  HGB 10.4* 9.0* 9.1* 8.9* 7.7*  HCT 32.1* 28.2* 28.2* 26.8* 23.3*  MCV 95.1 94.2 94.3 93.2 91.2  PLT 272 238 226 221 191    Cardiac Enzymes:  Recent Labs Lab 03/26/17 2028 03/27/17 0058 03/27/17 0556 03/27/17 1214 03/27/17 1732  TROPONINI 0.12* 0.08* 0.05* 0.08* 0.08*    BNP: Invalid input(s): POCBNP  CBG:  Recent Labs Lab 03/28/17 0733 03/29/17 0656 03/30/17 0806  GLUCAP 114* 105* 38    Microbiology: Results for orders placed or performed during the hospital encounter of 03/26/17  C difficile quick scan w PCR reflex     Status: Abnormal   Collection Time: 03/27/17  4:33 AM  Result Value Ref Range Status   C Diff antigen POSITIVE (A) NEGATIVE Final    Comment: CRITICAL RESULT CALLED TO, READ BACK BY AND VERIFIED WITH: ROBERT Martinique ON 03/27/17 AT605 BY TLB    C Diff toxin POSITIVE (A) NEGATIVE Final   C Diff interpretation Toxin producing C. difficile detected.  Final    Coagulation Studies: No results for input(s): LABPROT, INR in the last 72 hours.  Urinalysis: No results for input(s): COLORURINE, LABSPEC, PHURINE, GLUCOSEU, HGBUR, BILIRUBINUR, KETONESUR, PROTEINUR, UROBILINOGEN, NITRITE, LEUKOCYTESUR in  the last 72 hours.  Invalid input(s): APPERANCEUR    Imaging: Ct Abdomen Pelvis Wo Contrast  Result Date: 03/29/2017 CLINICAL DATA:  76 year old male presenting with diarrhea. Increasing lethargy and altered mental status. Acute renal failure. EXAM: CT ABDOMEN AND PELVIS WITHOUT CONTRAST TECHNIQUE: Multidetector CT imaging of the abdomen and pelvis was performed following the standard protocol without IV contrast. COMPARISON:  CT Abdomen and Pelvis 03/27/2017 and earlier. FINDINGS: Lower chest: Small layering pleural effusions are new from 2 days ago. New bilateral lower lobe peribronchial opacity greater on the left. Mildly lower lung volumes. Stable cardiac size. No pericardial effusion. Calcified coronary artery atherosclerosis. Hepatobiliary: Nodular contour again noted. Stable mildly distended gallbladder. No discrete liver lesion in the absence of IV contrast. Pancreas: Negative. Spleen: Negative, no splenomegaly. Adrenals/Urinary Tract: Negative adrenal glands. Stable noncontrast kidneys with no hydronephrosis or hydroureter. Mild calcified renal artery atherosclerosis on the right. Negative course of both ureters. Unremarkable urinary bladder. No abdominal free fluid.  No free air. Stomach/Bowel: Fluid in the rectum. Similar appearance of mild wall thickening in the rectosigmoid colon. Redundant sigmoid. Increased sigmoid mesenteric stranding. Similar appearance of mild indistinctness and wall thickening in the distal left colon. Increased mild pericolic gutter stranding. The splenic flexure, transverse and right colon have a more normal appearance although are intermittently fluid-filled. Negative appendix. No dilated small bowel. Negative stomach except for possible small sliding-type hiatal hernia. Negative duodenum. Vascular/Lymphatic: Right femoral vein approach dual lumen central catheter terminates in the distal IVC. Mild Calcified aortic atherosclerosis. Vascular patency is not evaluated in  the absence of IV contrast. Reproductive: Stable prostate hypertrophy. Other: Trace pelvic free fluid and increased presacral stranding. Musculoskeletal: No acute osseous abnormality identified. IMPRESSION: 1. New small bilateral pleural effusions and increased bilateral lower lobe peribronchial opacity which could reflect atelectasis or developing infection. 2. Mild generalized colitis again suspected, with interval increased inflammatory stranding in the sigmoid mesentery and new trace pelvic free fluid. 3. Right femoral vein approach dual lumen catheter terminates at the distal IVC. 4. Nodular liver again suspicious for cirrhosis. 5. Mild Calcified aortic atherosclerosis. Electronically Signed   By: Genevie Ann M.D.   On: 03/29/2017 15:16   Ct Head Wo Contrast  Result Date: 03/29/2017 CLINICAL DATA:  Increased lethargy.  Altered mental status. EXAM: CT HEAD WITHOUT CONTRAST TECHNIQUE: Contiguous axial images were obtained from the base of the skull through the vertex without intravenous contrast. COMPARISON:  02/12/2016 FINDINGS: Brain: Mild chronic ischemic changes in the periventricular white matter. Global atrophy. No mass effect, midline shift, or acute hemorrhage. Vascular: No hyperdense vessel or unexpected calcification. Skull: No acute fracture.  No destructive bone lesion. Sinuses/Orbits: Minimal mucosal thickening in the maxillary sinuses. Mastoid air cells clear. Other: None. IMPRESSION: No acute intracranial pathology. Electronically Signed   By: Marybelle Killings M.D.   On:  03/29/2017 11:41     Medications:   . sodium chloride    . dextrose 5 % and 0.45 % NaCl with KCl 20 mEq/L 20 mL/hr at 03/29/17 1600   . amLODipine  2.5 mg Oral Daily  . aspirin EC  81 mg Oral Daily  . atorvastatin  20 mg Oral Daily  . chlorhexidine  15 mL Mouth Rinse BID  . feeding supplement (GLUCERNA SHAKE)  237 mL Oral TID BM  . ferrous sulfate  325 mg Oral Q breakfast  . folic acid  1 mg Intravenous Daily  .  hydrALAZINE  50 mg Oral TID  . mouth rinse  15 mL Mouth Rinse q12n4p  . metoprolol succinate  25 mg Oral Daily  . metronidazole  500 mg Intravenous Q8H  . multivitamin with minerals  1 tablet Oral Daily  . pantoprazole  40 mg Oral Daily  . piperacillin-tazobactam (ZOSYN)  IV  3.375 g Intravenous Q12H  . polyvinyl alcohol  1 drop Both Eyes BID  . sodium bicarbonate  650 mg Oral BID  . tamsulosin  0.4 mg Oral QPC supper  . thiamine injection  100 mg Intravenous Daily   acetaminophen **OR** acetaminophen, gabapentin, morphine injection, ondansetron (ZOFRAN) IV, ondansetron, promethazine  Assessment/ Plan:  Mr. Jamesen Stahnke is a 76 y.o. Hispanic malewith diabetes mellitus type II, hypertension, hepatic cirrhosis, BPH, gout, diabetic neuropathy  1. End Stage Renal Disease: with proteinuria, metabolic acidosis and hypoalbuminemia.  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 Baseline creatinine of 6.23, GFR of 8 on 02/11/17.    - Foley was placed yesterday (4/15)  for urinary retention and discomfort  - Started on hemodialysis on 4/13.  - Plan for dialysis outpatient at [facility].  - will need to be able to sit for dialysis in the chair  2.  Diabetes mellitus type II with chronic kidney disease: insulin dependent  3. Anemia of chronic kidney disease: gets outpatient [facility].  - EPO with HD   4. Colitis: failure outpatient therapy for c. Diff colitis.    - management as per primary team  5. Encephalopathy: concerning for uremia.  - d/c baclofen - minimize neuro-active agents   LOS: 4 Dalyce Renne 4/16/201811:20 AM

## 2017-04-01 LAB — HEPATITIS B SURFACE ANTIGEN: Hepatitis B Surface Ag: NEGATIVE

## 2017-04-01 MED ORDER — VANCOMYCIN 50 MG/ML ORAL SOLUTION: 125.0000 mg | ORAL | Status: DC

## 2017-04-01 MED ORDER — VANCOMYCIN 50 MG/ML ORAL SOLUTION: 125.0000 mg | Freq: Every day | ORAL | Status: DC

## 2017-04-01 MED ORDER — CEFAZOLIN IN D5W 1 GM/50ML IV SOLN
1.0000 g | INTRAVENOUS | Status: DC
  Filled 2017-04-01: qty 50

## 2017-04-01 MED ORDER — FOLIC ACID 1 MG PO TABS
1.0000 mg | ORAL_TABLET | Freq: Every day | ORAL | Status: DC
  Administered 2017-04-02 – 2017-04-04 (×3): 1 mg via ORAL
  Filled 2017-04-01 (×3): qty 1

## 2017-04-01 MED ORDER — VANCOMYCIN 50 MG/ML ORAL SOLUTION: 125.0000 mg | Freq: Two times a day (BID) | ORAL | Status: DC

## 2017-04-01 MED ORDER — OXYCODONE HCL 5 MG PO TABS: 5.0000 mg | ORAL_TABLET | Freq: Once | ORAL | Status: DC

## 2017-04-01 MED ORDER — TUBERCULIN PPD 5 UNIT/0.1ML ID SOLN
5.0000 [IU] | Freq: Once | INTRADERMAL | Status: AC
  Administered 2017-04-01: 5 [IU] via INTRADERMAL
  Filled 2017-04-01: qty 0.1

## 2017-04-01 MED ORDER — VITAMIN B-1 100 MG PO TABS
100.0000 mg | ORAL_TABLET | Freq: Every day | ORAL | Status: DC
  Administered 2017-04-02 – 2017-04-04 (×3): 100 mg via ORAL
  Filled 2017-04-01 (×3): qty 1

## 2017-04-01 MED ORDER — VANCOMYCIN 50 MG/ML ORAL SOLUTION
125.0000 mg | Freq: Four times a day (QID) | ORAL | Status: DC
  Administered 2017-04-01 – 2017-04-04 (×12): 125 mg via ORAL
  Filled 2017-04-01 (×16): qty 2.5

## 2017-04-01 NOTE — Plan of Care (Signed)
Problem: Bowel/Gastric: Goal: Will not experience complications related to bowel motility Outcome: Not Progressing Patient positive for CDiff. Patient still having episodes of diarrhea.

## 2017-04-01 NOTE — Progress Notes (Signed)
[facility] NAME: Martin Gilbert    MR#:  [id number]  DATE OF BIRTH:  1941-07-16  SUBJECTIVE: Seen at bedside, alert, awake, oriented. Complains ofmild abdominal pain.   CHIEF COMPLAINT:   Chief Complaint  Patient presents with  . Abdominal Pain  . Diarrhea    REVIEW OF SYSTEMS:    Review of Systems  Constitutional: Negative for chills and fever.  HENT: Negative for hearing loss.   Eyes: Negative for blurred vision, double vision and photophobia.  Respiratory: Negative for cough, hemoptysis and shortness of breath.   Cardiovascular: Negative for palpitations, orthopnea and leg swelling.  Gastrointestinal: Positive for abdominal pain. Negative for diarrhea and vomiting.  Genitourinary: Negative for dysuria and urgency.  Musculoskeletal: Negative for myalgias and neck pain.  Skin: Negative for rash.  Neurological: Negative for dizziness, focal weakness, seizures, weakness and headaches.  Psychiatric/Behavioral: Negative for memory loss. The patient does not have insomnia.     Nutrition:  not Tolerating Diet: Tolerating PT:      DRUG ALLERGIES:  No Known Allergies  VITALS:  Blood pressure (!) 151/51, pulse 78, temperature 98.2 F (36.8 C), temperature source Oral, resp. rate 17, height 5\' 8"  (1.727 m), weight 79.7 kg (175 lb 11.3 oz), SpO2 96 %.  PHYSICAL EXAMINATION:   Physical Exam  GENERAL:  76 y.o.-year-old patient lying in the bed Moaning.  EYES: Pupils equal, round, reactive to light No scleral icterus. Extraocular muscles intact.  HEENT: Head atraumatic, normocephalic.  Oropharynx and nasopharynx clear.  NECK:  Supple, no jugular venous distention. No thyroid enlargement, no tenderness.  LUNGS: Normal breath sounds bilaterally, no wheezing, rales,rhonchi or crepitation. No use of accessory muscles of respiration.  CARDIOVASCULAR: S1, S2 normal. No murmurs, rubs, or gallops.  ABDOMEN:  grinning to palpating left side of abdomen EXTREMITIES: No pedal edema, cyanosis, or clubbing.  NEUROLOGIC:more alert,awake. PSYCHIATRIC:  More alert.  SKIN: No obvious rash, lesion, or ulcer.    LABORATORY PANEL:   CBC  Recent Labs Lab 03/31/17 1030  WBC 11.7*  HGB 8.1*  HCT 24.9*  PLT 204   ------------------------------------------------------------------------------------------------------------------  Chemistries   Recent Labs Lab 03/28/17 1800  03/31/17 1030  NA 140  < > 142  K 3.5  < > 3.1*  CL 110  < > 104  CO2 18*  < > 27  GLUCOSE 99  < > 72  BUN 114*  < > 56*  CREATININE 13.21*  < > 8.71*  CALCIUM 7.8*  < > 7.3*  AST 21  --   --   ALT 9*  --   --   ALKPHOS 74  --   --   BILITOT 0.6  --   --   < > = values in this interval not displayed. ------------------------------------------------------------------------------------------------------------------  Cardiac Enzymes  Recent Labs Lab 03/27/17 1732  TROPONINI 0.08*   ------------------------------------------------------------------------------------------------------------------  RADIOLOGY:  No results found.   ASSESSMENT AND PLAN:   Active Problems:   Anemia of chronic disease   Colitis   Demand ischemia ([place])   ARF (acute renal failure) ([name])   Enteritis due to Clostridium difficile   #1 .recurrent C. difficile colitis.;She is alert, awake, oriented, resume by mouth vancomycin and stop IV Flagyl.   #2 lethargy;.at least multifactorial due to infection, renal failure, multiple medical problems with diabetes ,may ne underlying nutritional deficiency. Lethargy resolving, continue by mouth thiamine, folic acid. Continue to treat infection, continue hemodialysis, patient  Needs  permacath , #3 essential hypertension:start po meds #4 diabetes mellitus type 2 ; continue SSI  With coverage  #5  pts daughter speaks good english and she translated.  Family happy with treatment and his overall  improvement. #6 possible BPH:;continue flomax.  Foley draining clear urine.6 agitation: Use Risperdal. 7 ESRD: Hemodialysis started. Not aware of this,  explained familyto tell him about this hemodialysis, so that we can plan for permacath placement.  All the records are reviewed and case discussed with Care Management/Social Workerr. Management plans discussed with the patient, family and they are in agreement.  CODE STATUS: full  TOTAL TIME TAKING CARE OF THIS PATIENT: 35 minutes.   POSSIBLE D/C IN 7 DAYS, DEPENDING ON CLINICAL CONDITION.   Epifanio Lesches M.D on 04/01/2017 at 1:28 PM  Between 7am to 6pm - Pager - [phone]  After 6pm go to [email or web address] - password [email or web address] [facility]  Office  [phone]  CC: Primary care physician; [name], MD

## 2017-04-01 NOTE — Progress Notes (Signed)
Initial Nutrition Assessment  DOCUMENTATION CODES:   Severe malnutrition in context of acute illness/injury  INTERVENTION:  1. Continue Glucerna Shake po TID, each supplement provides 220 kcal and 10 grams of protein  NUTRITION DIAGNOSIS:   Malnutrition related to acute illness as evidenced by moderate depletion of body fat, moderate depletions of muscle mass, energy intake < or equal to 50% for > or equal to 5 days. -ongoing  GOAL:   Patient will meet greater than or equal to 90% of their needs -progressing  ASSESSMENT:   Martin Gilbert  is a 76 y.o. male with a known history of Chronic kidney disease stage IV, type 2 diabetes mellitus, hypertension, hyperlipidemia, benign prostate hypertrophy, C. difficile colitis presented to the emergency room with abdominal discomfort and diarrhea  Diet was advanced yesterday afternoon. Improving, nausea resolving, still having some diarrhea. Consumed a glucerna shake this morning.  Ate scrambled eggs, toast, orange juice this morning. Also consumed some apple juice. Continue to monitor PO intake.  Labs and medications reviewed: Iron, Folic Acid, MVI w/ Minerals, Thiamine 1/2 NS w/ D5 and KCL 18mEq @ 24mL/hr --> 82 calories  Diet Order:  DIET SOFT Room service appropriate? Yes; Fluid consistency: Thin  Skin:  Reviewed, no issues  Last BM:  03/30/2017  Height:   Ht Readings from Last 1 Encounters:  03/26/17 5\' 8"  (1.727 m)    Weight:   Wt Readings from Last 1 Encounters:  03/31/17 175 lb 11.3 oz (79.7 kg)    Ideal Body Weight:  70 kg  BMI:  Body mass index is 26.72 kg/m.  Estimated Nutritional Needs:   Kcal:  1600-2000 calories  Protein:  80-95 grams  Fluid:  >/= 1.6L  EDUCATION NEEDS:   Education needs no appropriate at this time  Satira Anis. Martin Sliwa, MS, RD LDN Inpatient Clinical Dietitian Pager [phone]

## 2017-04-01 NOTE — Plan of Care (Signed)
Problem: Pain Managment: Goal: General experience of comfort will improve Outcome: Progressing No complaints of pain.

## 2017-04-01 NOTE — Progress Notes (Signed)
Subjective: Patient sitting up in bed today.  Improved.    Objective: Current vital signs: BP (!) 151/51 (BP Location: Left Arm)   Pulse 78   Temp 98.2 F (36.8 C) (Oral)   Resp 17   Ht 5\' 8"  (1.727 m)   Wt 79.7 kg (175 lb 11.3 oz)   SpO2 96%   BMI 26.72 kg/m  Vital signs in last 24 hours: Temp:  [97.8 F (36.6 C)-98.9 F (37.2 C)] 98.2 F (36.8 C) (04/17 1248) Pulse Rate:  [76-91] 78 (04/17 1248) Resp:  [17-18] 17 (04/17 0932) BP: (151-177)/(51-96) 151/51 (04/17 1248) SpO2:  [93 %-99 %] 96 % (04/17 1248)  Intake/Output from previous day: 04/16 0701 - 04/17 0700 In: 310.3 [I.V.:160.3; IV Piggyback:150] Out: -100 [Urine:300] Intake/Output this shift: No intake/output data recorded. Nutritional status: DIET SOFT Room service appropriate? Yes; Fluid consistency: Thin  Neurologic Exam: Mental Status: Alert.  Follows some simple commands.   Cranial Nerves: III,IV, VI: ptosis not present, extra-ocular motions intact bilaterally V,VII: smile symmetric, facial light touch sensation normal bilaterally VIII: hearing normal bilaterally IX,X: gag reflex present XI: bilateral shoulder shrug XII: midline tongue extension Motor: Right : Upper extremity   5/5    Left:     Upper extremity   5/5  Lower extremity   5/5     Lower extremity   5/5 Tone and bulk:normal tone throughout; no atrophy noted    Lab Results: Basic Metabolic Panel:  Recent Labs Lab 03/26/17 2028 03/27/17 0556 03/28/17 1800 03/29/17 0521 03/31/17 1030  NA 138 140 140 140  141 142  K 4.3 4.2 3.5 3.2*  3.3* 3.1*  CL 116* 118* 110 104  105 104  CO2 12* 11* 18* 27  28 27   GLUCOSE 126* 97 99 110*  111* 72  BUN 97* 106* 114* 75*  76* 56*  CREATININE 11.51* 11.83* 13.21* 9.47*  9.75* 8.71*  CALCIUM 9.0 8.5* 7.8* 7.4*  7.3* 7.3*  PHOS  --   --  5.7* 4.9* 4.5    Liver Function Tests:  Recent Labs Lab 03/26/17 2028 03/28/17 1800 03/29/17 0521 03/31/17 1030  AST 20 21  --   --   ALT 14*  9*  --   --   ALKPHOS 98 74  --   --   BILITOT 0.5 0.6  --   --   PROT 7.0 5.0*  --   --   ALBUMIN 3.3* 2.3* 2.2* 2.1*    Recent Labs Lab 03/26/17 2028  LIPASE 159*    Recent Labs Lab 03/29/17 1326  AMMONIA 16    CBC:  Recent Labs Lab 03/27/17 0556 03/28/17 0640 03/28/17 1800 03/29/17 0521 03/31/17 1030  WBC 11.4* 13.9* 12.9* 11.3* 11.7*  HGB 9.0* 9.1* 8.9* 7.7* 8.1*  HCT 28.2* 28.2* 26.8* 23.3* 24.9*  MCV 94.2 94.3 93.2 91.2 92.6  PLT 238 226 221 191 204    Cardiac Enzymes:  Recent Labs Lab 03/26/17 2028 03/27/17 0058 03/27/17 0556 03/27/17 1214 03/27/17 1732  TROPONINI 0.12* 0.08* 0.05* 0.08* 0.08*    Lipid Panel: No results for input(s): CHOL, TRIG, HDL, CHOLHDL, VLDL, LDLCALC in the last 168 hours.  CBG:  Recent Labs Lab 03/28/17 0733 03/29/17 0656 03/30/17 0806  GLUCAP 114* 105* 88    Microbiology: Results for orders placed or performed during the hospital encounter of 03/26/17  C difficile quick scan w PCR reflex     Status: Abnormal   Collection Time: 03/27/17  4:33 AM  Result  Value Ref Range Status   C Diff antigen POSITIVE (A) NEGATIVE Final    Comment: CRITICAL RESULT CALLED TO, READ BACK BY AND VERIFIED WITH: ROBERT Martinique ON 03/27/17 AT605 BY TLB    C Diff toxin POSITIVE (A) NEGATIVE Final   C Diff interpretation Toxin producing C. difficile detected.  Final    Coagulation Studies: No results for input(s): LABPROT, INR in the last 72 hours.  Imaging: No results found.  Medications:  I have reviewed the patient's current medications. Scheduled: . amLODipine  2.5 mg Oral Daily  . aspirin EC  81 mg Oral Daily  . atorvastatin  20 mg Oral Daily  . chlorhexidine  15 mL Mouth Rinse BID  . feeding supplement (GLUCERNA SHAKE)  237 mL Oral TID BM  . ferrous sulfate  325 mg Oral Q breakfast  . folic acid  1 mg Intravenous Daily  . hydrALAZINE  50 mg Oral TID  . mouth rinse  15 mL Mouth Rinse q12n4p  . metoprolol succinate  25  mg Oral Daily  . multivitamin with minerals  1 tablet Oral Daily  . pantoprazole  40 mg Oral Daily  . polyvinyl alcohol  1 drop Both Eyes BID  . sodium bicarbonate  650 mg Oral BID  . tamsulosin  0.4 mg Oral QPC supper  . thiamine injection  100 mg Intravenous Daily    Assessment/Plan: Patient continues to improve.  Today talking ,ore and following commands.  Mental status likely metabolic and resolving.    No further neurologic intervention is recommended at this time.  If further questions arise, please call or page at that time.  Thank you for allowing neurology to participate in the care of this patient.    LOS: 5 days   Alexis Goodell, MD Neurology [phone] 04/01/2017  1:28 PM

## 2017-04-01 NOTE — Progress Notes (Signed)
Interpreter requested for assessment and for medication pass. Upon arrival to room, family member was very upset that I had an interpreter with me. Family member stated, "As long as I'm here we do not need an interpreter, the doctor said it was ok, ask the doctor." I explained to the family member that I was just following protocol and that legally we have to provide the patient with a medical interpreter. Family member said they did not want an interpreter. Interpreter was by the door of the room and witnessed the conversation. Interpreter and daughter had a small conversation in [place] and then the interpreter left. After the interpreter left I explained to the family that I wasn't trying to upset them that I was just following protocol. The daughter stated she understood and was not upset but that they just do not want an interpreter because she "leaves things out, and doesn't say everything."

## 2017-04-01 NOTE — Care Management (Signed)
Relayed during progression that patient's daughter does not wish to use interpreter services because "the information being translated is not correct.  Discussed having family sign waiver and referred primary nurse to unit director.

## 2017-04-01 NOTE — Progress Notes (Signed)
Speech Therapy Note: received order; reviewed chart notes; consulted w/ NSG and called Daughter by telephone. Both NSG and Daughter denied any difficulty swallowing, and Daughter stated pt was verbally communicating w/ herself and family more at his baseline today.  Pt does require an Astronomer d/t language barrier. Will f/u w/ pt and family tomorrow when Daughter is present to determine if ST services are indicated/needed. Daughter agreed; NSG updated and agreed.  Pt will continue w/ current regular diet at this time.    Orinda Kenner, [place], CCC-SLP

## 2017-04-01 NOTE — Progress Notes (Signed)
[facility]  ROUNDING NOTE   Subjective:   Patient is much more alert today Family- wife and daughter Angie at bedside He has started to take some by mouth    Objective:  Vital signs in last 24 hours:  Temp:  [97.8 F (36.6 C)-98.9 F (37.2 C)] 98.2 F (36.8 C) (04/17 1248) Pulse Rate:  [76-80] 78 (04/17 1248) Resp:  [17-18] 17 (04/17 0932) BP: (151-162)/(51-63) 151/51 (04/17 1248) SpO2:  [93 %-97 %] 96 % (04/17 1248)  Weight change:  Filed Weights   03/29/17 0523 03/29/17 0838 03/31/17 1036  Weight: 79.3 kg (174 lb 13.2 oz) 79.2 kg (174 lb 9.7 oz) 79.7 kg (175 lb 11.3 oz)    Intake/Output: I/O last 3 completed shifts: In: 680.3 [I.V.:280.3; IV Piggyback:400] Out: 300 [Urine:700]   Intake/Output this shift:  No intake/output data recorded.  Physical Exam: General: Ill appearing  Head: Normocephalic, atraumatic. Moist oral mucosal membranes  Eyes: Anicteric,   Neck: Supple, trachea midline  Lungs:  Clear to auscultation  Heart: Regular rate and rhythm  Abdomen:  Soft, mild tenderness  Extremities:  no peripheral edema.  Neurologic: Alert and able to follow simple commands  Skin: No lesions  Access: Right femoral temp HD catheter 4/13 Dr. Lucky Cowboy    Basic Metabolic Panel:  Recent Labs Lab 03/26/17 2028 03/27/17 0556 03/28/17 1800 03/29/17 0521 03/31/17 1030  NA 138 140 140 140  141 142  K 4.3 4.2 3.5 3.2*  3.3* 3.1*  CL 116* 118* 110 104  105 104  CO2 12* 11* 18* 27  28 27   GLUCOSE 126* 97 99 110*  111* 72  BUN 97* 106* 114* 75*  76* 56*  CREATININE 11.51* 11.83* 13.21* 9.47*  9.75* 8.71*  CALCIUM 9.0 8.5* 7.8* 7.4*  7.3* 7.3*  PHOS  --   --  5.7* 4.9* 4.5    Liver Function Tests:  Recent Labs Lab 03/26/17 2028 03/28/17 1800 03/29/17 0521 03/31/17 1030  AST 20 21  --   --   ALT 14* 9*  --   --   ALKPHOS 98 74  --   --   BILITOT 0.5 0.6  --   --   PROT 7.0 5.0*  --   --   ALBUMIN 3.3* 2.3* 2.2* 2.1*    Recent  Labs Lab 03/26/17 2028  LIPASE 159*    Recent Labs Lab 03/29/17 1326  AMMONIA 16    CBC:  Recent Labs Lab 03/27/17 0556 03/28/17 0640 03/28/17 1800 03/29/17 0521 03/31/17 1030  WBC 11.4* 13.9* 12.9* 11.3* 11.7*  HGB 9.0* 9.1* 8.9* 7.7* 8.1*  HCT 28.2* 28.2* 26.8* 23.3* 24.9*  MCV 94.2 94.3 93.2 91.2 92.6  PLT 238 226 221 191 204    Cardiac Enzymes:  Recent Labs Lab 03/26/17 2028 03/27/17 0058 03/27/17 0556 03/27/17 1214 03/27/17 1732  TROPONINI 0.12* 0.08* 0.05* 0.08* 0.08*    BNP: Invalid input(s): POCBNP  CBG:  Recent Labs Lab 03/28/17 0733 03/29/17 0656 03/30/17 0806  GLUCAP 114* 105* 88    Microbiology: Results for orders placed or performed during the hospital encounter of 03/26/17  C difficile quick scan w PCR reflex     Status: Abnormal   Collection Time: 03/27/17  4:33 AM  Result Value Ref Range Status   C Diff antigen POSITIVE (A) NEGATIVE Final    Comment: CRITICAL RESULT CALLED TO, READ BACK BY AND VERIFIED WITH: ROBERT Martinique ON 03/27/17 AT605 BY TLB    C Diff toxin  POSITIVE (A) NEGATIVE Final   C Diff interpretation Toxin producing C. difficile detected.  Final    Coagulation Studies: No results for input(s): LABPROT, INR in the last 72 hours.  Urinalysis: No results for input(s): COLORURINE, LABSPEC, PHURINE, GLUCOSEU, HGBUR, BILIRUBINUR, KETONESUR, PROTEINUR, UROBILINOGEN, NITRITE, LEUKOCYTESUR in the last 72 hours.  Invalid input(s): APPERANCEUR    Imaging: No results found.   Medications:   . sodium chloride    . dextrose 5 % and 0.45 % NaCl with KCl 20 mEq/L 20 mL/hr at 03/29/17 1600  . piperacillin-tazobactam (ZOSYN)  IV     . amLODipine  2.5 mg Oral Daily  . aspirin EC  81 mg Oral Daily  . atorvastatin  20 mg Oral Daily  . chlorhexidine  15 mL Mouth Rinse BID  . feeding supplement (GLUCERNA SHAKE)  237 mL Oral TID BM  . ferrous sulfate  325 mg Oral Q breakfast  . [START ON 5/32/9924] folic acid  1 mg Oral  Daily  . hydrALAZINE  50 mg Oral TID  . mouth rinse  15 mL Mouth Rinse q12n4p  . metoprolol succinate  25 mg Oral Daily  . multivitamin with minerals  1 tablet Oral Daily  . pantoprazole  40 mg Oral Daily  . polyvinyl alcohol  1 drop Both Eyes BID  . sodium bicarbonate  650 mg Oral BID  . tamsulosin  0.4 mg Oral QPC supper  . [START ON 04/02/2017] thiamine  100 mg Oral Daily  . tuberculin  5 Units Intradermal Once  . vancomycin  125 mg Oral QID   Followed by  . [START ON 04/15/2017] vancomycin  125 mg Oral BID   Followed by  . [START ON 04/23/2017] vancomycin  125 mg Oral Daily   Followed by  . [START ON 04/30/2017] vancomycin  125 mg Oral QODAY   Followed by  . [START ON 05/08/2017] vancomycin  125 mg Oral Q3 days   acetaminophen **OR** acetaminophen, gabapentin, morphine injection, ondansetron (ZOFRAN) IV, ondansetron, promethazine  Assessment/ Plan:  Mr. Makye Radle is a 76 y.o. Hispanic malewith diabetes mellitus type II, hypertension, hepatic cirrhosis, BPH, gout, diabetic neuropathy  1. End Stage Renal Disease: with proteinuria, metabolic acidosis and hypoalbuminemia.  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 Baseline creatinine of 6.23, GFR of 8 on 02/11/17.    - Foley was placed  (4/15)  for urinary retention and discomfort  - Started on hemodialysis on 4/13.  - Plan for dialysis outpatient at [facility] as that unit is closest to his house - patient to sit in the chair for dialysis tomorrow - PermCath tomorrow - Family to help with breaking the news to patient that he will require permanent dialysis  2.  Diabetes mellitus type II with chronic kidney disease: insulin dependent  3. Anemia of chronic kidney disease: gets outpatient Aranesp.  - EPO with HD   4. C diff Colitis: failure outpatient therapy for c. Diff colitis.    - management as per primary team  5. Encephalopathy: concerning for uremia, medication related.   - d/c baclofen - minimize neuro-active agents   LOS: 5 Marisella Puccio 4/17/20186:58 PM

## 2017-04-01 NOTE — Progress Notes (Signed)
PPD test administered to left forearm at 1925. Please read in 48 hours. Earleen Reaper, RN

## 2017-04-02 ENCOUNTER — Encounter
Admission: EM | Disposition: A | Discharge status: Skilled Nursing Facility | Payer: Self-pay | Source: Home / Self Care | Attending: Internal Medicine

## 2017-04-02 LAB — BLOOD GAS, ARTERIAL
ACID-BASE EXCESS: 9.4 mmol/L — AB (ref 0.0–2.0)
Bicarbonate: 32.1 mmol/L — ABNORMAL HIGH (ref 20.0–28.0)
FIO2: 0.21
O2 SAT: 95.2 %
PATIENT TEMPERATURE: 37
PCO2 ART: 35 mmHg (ref 32.0–48.0)
pH, Arterial: 7.57 — ABNORMAL HIGH (ref 7.350–7.450)
pO2, Arterial: 65 mmHg — ABNORMAL LOW (ref 83.0–108.0)

## 2017-04-02 SURGERY — DIALYSIS/PERMA CATHETER INSERTION
Anesthesia: Moderate Sedation

## 2017-04-02 MED ORDER — DEXTROSE 5 % IV SOLN
2.0000 g | Freq: Once | INTRAVENOUS | Status: AC
  Administered 2017-04-02: 2 g via INTRAVENOUS
  Filled 2017-04-02: qty 2000

## 2017-04-02 MED ORDER — MELATONIN 5 MG PO TABS
5.0000 mg | ORAL_TABLET | Freq: Every day | ORAL | Status: DC
  Administered 2017-04-02 – 2017-04-03 (×2): 5 mg via ORAL
  Filled 2017-04-02 (×3): qty 1

## 2017-04-02 NOTE — Progress Notes (Signed)
Post hd tx VS

## 2017-04-02 NOTE — Care Management (Signed)
Shanon Payor ?TTS first shift has offered.  Patient's PD to be read tomorrow.  Patient is more alert today and staff aware he should sit for his dialysis treatment. Perm cath scheduled to be inserted today but a dialysis tx may have interfered with that schedule.

## 2017-04-02 NOTE — Progress Notes (Signed)
[place] at [place] NAME: Martin Gilbert    MR#:  [id number]  DATE OF BIRTH:  26-Aug-1941  Patient is very alert, awake, oriented, going for permacath placement.  No Abdominal pain or diarrhea.   CHIEF COMPLAINT:   Chief Complaint  Patient presents with  . Abdominal Pain  . Diarrhea    REVIEW OF SYSTEMS:    Review of Systems  Constitutional: Negative for chills and fever.  HENT: Negative for hearing loss.   Eyes: Negative for blurred vision, double vision and photophobia.  Respiratory: Negative for cough, hemoptysis and shortness of breath.   Cardiovascular: Negative for palpitations, orthopnea and leg swelling.  Gastrointestinal: Negative for abdominal pain, diarrhea and vomiting.  Genitourinary: Negative for dysuria and urgency.  Musculoskeletal: Negative for myalgias and neck pain.  Skin: Negative for rash.  Neurological: Negative for dizziness, focal weakness, seizures, weakness and headaches.  Psychiatric/Behavioral: Negative for memory loss. The patient does not have insomnia.     Nutrition:  not Tolerating Diet: Tolerating PT:      DRUG ALLERGIES:  No Known Allergies  VITALS:  Blood pressure (!) 162/67, pulse 63, temperature 98.7 F (37.1 C), temperature source Oral, resp. rate 18, height 5\' 8"  (1.727 m), weight 79.7 kg (175 lb 11.3 oz), SpO2 94 %.  PHYSICAL EXAMINATION:   Physical Exam  GENERAL:  76 y.o.-year-old patient lying in the bed Moaning.  EYES: Pupils equal, round, reactive to light No scleral icterus. Extraocular muscles intact.  HEENT: Head atraumatic, normocephalic.  Oropharynx and nasopharynx clear.  NECK:  Supple, no jugular venous distention. No thyroid enlargement, no tenderness.  LUNGS: Normal breath sounds bilaterally, no wheezing, rales,rhonchi or crepitation. No use of accessory muscles of respiration.  CARDIOVASCULAR: S1, S2 normal. No murmurs, rubs, or gallops.  ABDOMEN:  grinning to palpating left side of abdomen EXTREMITIES: No pedal edema, cyanosis, or clubbing.  NEUROLOGIC:more alert,awake. PSYCHIATRIC:  More alert.  SKIN: No obvious rash, lesion, or ulcer.    LABORATORY PANEL:   CBC  Recent Labs Lab 03/31/17 1030  WBC 11.7*  HGB 8.1*  HCT 24.9*  PLT 204   ------------------------------------------------------------------------------------------------------------------  Chemistries   Recent Labs Lab 03/28/17 1800  03/31/17 1030  NA 140  < > 142  K 3.5  < > 3.1*  CL 110  < > 104  CO2 18*  < > 27  GLUCOSE 99  < > 72  BUN 114*  < > 56*  CREATININE 13.21*  < > 8.71*  CALCIUM 7.8*  < > 7.3*  AST 21  --   --   ALT 9*  --   --   ALKPHOS 74  --   --   BILITOT 0.6  --   --   < > = values in this interval not displayed. ------------------------------------------------------------------------------------------------------------------  Cardiac Enzymes  Recent Labs Lab 03/27/17 1732  TROPONINI 0.08*   ------------------------------------------------------------------------------------------------------------------  RADIOLOGY:  No results found.   ASSESSMENT AND PLAN:   Active Problems:   Anemia of chronic disease   Colitis   Demand ischemia ([place])   ARF (acute renal failure) ([name])   Enteritis due to Clostridium difficile   #1 .recurrent C. difficile colitis.;She is alert, awake, oriented, resume by mouth vancomycin and stop IV Flagyl. The improving., Extended dose of vancomycin this time.  #2 lethargy;.at least multifactorial due to infection, renal failure, multiple medical problems with diabetes ,may ne underlying nutritional deficiency. Lethargy resolving, continue by mouth thiamine, folic  acid. Continue to treat infection, continue hemodialysis, permacath placement today .  He  knows that he needs to be on dialysis and he agreed for permacath. #3 essential hypertension:started  po meds #4 diabetes mellitus type 2 ;  continue SSI  With coverage  #5  pts daughter speaks good english and she translated.  Family happy with treatment and his overall improvement. #6 possible BPH:;continue flomax.  Foley draining clear urine .6 agitation: Use Risperdal. 7 ESRD: On hemodialysis, plan for dialysis day after permacath placement, discontinue Foley and start Flomax if patient retains urine consult urology.  All the records are reviewed and case discussed with Care Management/Social Workerr. Management plans discussed with the patient, family and they are in agreement.  CODE STATUS: full  TOTAL TIME TAKING CARE OF THIS PATIENT: 35 minutes.   POSSIBLE D/C IN 7 DAYS, DEPENDING ON CLINICAL CONDITION.   Epifanio Lesches M.D on 04/02/2017 at 2:17 PM  Between 7am to 6pm - Pager - [phone]  After 6pm go to [email or web address] - password [email or web address] [facility]  Office  [phone]  CC: Primary care physician; [name], MD

## 2017-04-02 NOTE — Progress Notes (Signed)
SLP Cancellation Note  Patient Details Name: Martin Gilbert MRN: [id number] DOB: 1941/05/01   Cancelled treatment:       Reason Eval/Treat Not Completed: SLP screened, no needs identified, will sign off (reviewed chart notes; consulted NSG today)  NSG indicated pt is swallowing pills appropriately one at a time w/ water w/ no overt s/s of aspiration noted. Following up on the report from speaking w/ the Daughter, pt appears at his baseline for swallowing. MD's note this AM indicated pt was much more alert, and again, Daughter indicated much more at his baseline w/ communicating w/ others.  No skilled ST services indicated at this time. ST will sign off w/ NSG to reconsult if indicated. NSG agreed.   Orinda Kenner, MS, CCC-SLP Watson,Katherine 04/02/2017, 12:19 PM

## 2017-04-02 NOTE — Progress Notes (Signed)
[facility]  ROUNDING NOTE   Subjective:   Patient is much more alert today Family- wife and daughter Martin Gilbert at bedside He has started to eat better  Objective:  Vital signs in last 24 hours:  Temp:  [97.8 F (36.6 C)-98.7 F (37.1 C)] 98.4 F (36.9 C) (04/18 1458) Pulse Rate:  [63-78] 69 (04/18 1630) Resp:  [13-19] 15 (04/18 1630) BP: (135-174)/(53-73) 163/53 (04/18 1630) SpO2:  [94 %-100 %] 98 % (04/18 1630) Weight:  [77.5 kg (170 lb 13.7 oz)] 77.5 kg (170 lb 13.7 oz) (04/18 1458)  Weight change:  Filed Weights   03/29/17 0838 03/31/17 1036 04/02/17 1458  Weight: 79.2 kg (174 lb 9.7 oz) 79.7 kg (175 lb 11.3 oz) 77.5 kg (170 lb 13.7 oz)    Intake/Output: I/O last 3 completed shifts: In: -  Out: 550 [Urine:550]   Intake/Output this shift:  Total I/O In: 1119.3 [I.V.:949.3; IV Piggyback:170] Out: 150 [Urine:150]  Physical Exam: General: Ill appearing  Head: Normocephalic, atraumatic. Moist oral mucosal membranes  Eyes: Anicteric,   Neck: Supple, trachea midline  Lungs:  Clear to auscultation  Heart: Regular rate and rhythm  Abdomen:  Soft, mild tenderness  Extremities:  no peripheral edema.  Neurologic: Alert and able to follow simple commands  Skin: No lesions  Access: Right femoral temp HD catheter 4/13 Dr. Lucky Gilbert    Basic Metabolic Panel:  Recent Labs Lab 03/26/17 2028 03/27/17 0556 03/28/17 1800 03/29/17 0521 03/31/17 1030  NA 138 140 140 140  141 142  K 4.3 4.2 3.5 3.2*  3.3* 3.1*  CL 116* 118* 110 104  105 104  CO2 12* 11* 18* 27  28 27   GLUCOSE 126* 97 99 110*  111* 72  BUN 97* 106* 114* 75*  76* 56*  CREATININE 11.51* 11.83* 13.21* 9.47*  9.75* 8.71*  CALCIUM 9.0 8.5* 7.8* 7.4*  7.3* 7.3*  PHOS  --   --  5.7* 4.9* 4.5    Liver Function Tests:  Recent Labs Lab 03/26/17 2028 03/28/17 1800 03/29/17 0521 03/31/17 1030  AST 20 21  --   --   ALT 14* 9*  --   --   ALKPHOS 98 74  --   --   BILITOT 0.5 0.6  --   --    PROT 7.0 5.0*  --   --   ALBUMIN 3.3* 2.3* 2.2* 2.1*    Recent Labs Lab 03/26/17 2028  LIPASE 159*    Recent Labs Lab 03/29/17 1326  AMMONIA 16    CBC:  Recent Labs Lab 03/27/17 0556 03/28/17 0640 03/28/17 1800 03/29/17 0521 03/31/17 1030  WBC 11.4* 13.9* 12.9* 11.3* 11.7*  HGB 9.0* 9.1* 8.9* 7.7* 8.1*  HCT 28.2* 28.2* 26.8* 23.3* 24.9*  MCV 94.2 94.3 93.2 91.2 92.6  PLT 238 226 221 191 204    Cardiac Enzymes:  Recent Labs Lab 03/26/17 2028 03/27/17 0058 03/27/17 0556 03/27/17 1214 03/27/17 1732  TROPONINI 0.12* 0.08* 0.05* 0.08* 0.08*    BNP: Invalid input(s): POCBNP  CBG:  Recent Labs Lab 03/28/17 0733 03/29/17 0656 03/30/17 0806  GLUCAP 114* 105* 88    Microbiology: Results for orders placed or performed during the hospital encounter of 03/26/17  C difficile quick scan w PCR reflex     Status: Abnormal   Collection Time: 03/27/17  4:33 AM  Result Value Ref Range Status   C Diff antigen POSITIVE (A) NEGATIVE Final    Comment: CRITICAL RESULT CALLED TO, READ BACK BY  AND VERIFIED WITH: Martin Martinique ON 03/27/17 AT605 BY TLB    C Diff toxin POSITIVE (A) NEGATIVE Final   C Diff interpretation Toxin producing C. difficile detected.  Final    Coagulation Studies: No results for input(s): LABPROT, INR in the last 72 hours.  Urinalysis: No results for input(s): COLORURINE, LABSPEC, PHURINE, GLUCOSEU, HGBUR, BILIRUBINUR, KETONESUR, PROTEINUR, UROBILINOGEN, NITRITE, LEUKOCYTESUR in the last 72 hours.  Invalid input(s): APPERANCEUR    Imaging: No results found.   Medications:   . sodium chloride    . piperacillin-tazobactam (ZOSYN)  IV     . amLODipine  2.5 mg Oral Daily  . aspirin EC  81 mg Oral Daily  . atorvastatin  20 mg Oral Daily  . chlorhexidine  15 mL Mouth Rinse BID  . feeding supplement (GLUCERNA SHAKE)  237 mL Oral TID BM  . ferrous sulfate  325 mg Oral Q breakfast  . folic acid  1 mg Oral Daily  . hydrALAZINE  50 mg  Oral TID  . mouth rinse  15 mL Mouth Rinse q12n4p  . Melatonin  5 mg Oral QHS  . metoprolol succinate  25 mg Oral Daily  . multivitamin with minerals  1 tablet Oral Daily  . pantoprazole  40 mg Oral Daily  . polyvinyl alcohol  1 drop Both Eyes BID  . sodium bicarbonate  650 mg Oral BID  . tamsulosin  0.4 mg Oral QPC supper  . thiamine  100 mg Oral Daily  . tuberculin  5 Units Intradermal Once  . vancomycin  125 mg Oral QID   Followed by  . [START ON 04/15/2017] vancomycin  125 mg Oral BID   Followed by  . [START ON 04/23/2017] vancomycin  125 mg Oral Daily   Followed by  . [START ON 04/30/2017] vancomycin  125 mg Oral QODAY   Followed by  . [START ON 05/08/2017] vancomycin  125 mg Oral Q3 days   acetaminophen **OR** acetaminophen, gabapentin, morphine injection, ondansetron (ZOFRAN) IV, ondansetron, promethazine  Assessment/ Plan:  Martin Gilbert is a 76 y.o. Hispanic malewith diabetes mellitus type II, hypertension, hepatic cirrhosis, BPH, gout, diabetic neuropathy  1. End Stage Renal Disease: with proteinuria, metabolic acidosis and hypoalbuminemia.  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 Baseline creatinine of 6.23, GFR of 8 on 02/11/17.    - Foley was placed  (4/15)  for urinary retention and discomfort  - Started on hemodialysis on 4/13.  - Plan for dialysis outpatient at [facility] as that unit is closest to his house - patient to sit in the chair for dialysis tomorrow - PermCath placement pending    2.  Diabetes mellitus type II with chronic kidney disease: insulin dependent  3. Anemia of chronic kidney disease: gets outpatient Aranesp.  - EPO with HD   4. C diff Colitis: failure outpatient therapy for c. Diff colitis.    - management as per primary team  5. Encephalopathy: concerning for uremia, medication related.  - d/c baclofen - minimize neuro-active agents - seems to have helped - melatonin for sleep at  night   LOS: 6 Martin Gilbert 4/18/20185:12 PM

## 2017-04-03 ENCOUNTER — Encounter
Admission: EM | Disposition: A | Discharge status: Skilled Nursing Facility | Payer: Self-pay | Source: Home / Self Care | Attending: Internal Medicine

## 2017-04-03 ENCOUNTER — Inpatient Hospital Stay
Admission: EM | Disposition: A | Discharge status: Skilled Nursing Facility | Payer: Self-pay | Source: Home / Self Care | Attending: Internal Medicine

## 2017-04-03 DIAGNOSIS — N186 End stage renal disease: Secondary | ICD-10-CM

## 2017-04-03 HISTORY — PX: DIALYSIS/PERMA CATHETER INSERTION: CATH118288

## 2017-04-03 LAB — BASIC METABOLIC PANEL
ANION GAP: 6 (ref 5–15)
BUN: 19 mg/dL (ref 6–20)
CALCIUM: 7.5 mg/dL — AB (ref 8.9–10.3)
CO2: 31 mmol/L (ref 22–32)
CREATININE: 4.72 mg/dL — AB (ref 0.61–1.24)
Chloride: 101 mmol/L (ref 101–111)
GFR, EST AFRICAN AMERICAN: 13 mL/min — AB (ref >60)
GFR, EST NON AFRICAN AMERICAN: 11 mL/min — AB (ref >60)
GLUCOSE: 116 mg/dL — AB (ref 65–99)
Potassium: 3.5 mmol/L (ref 3.5–5.1)
Sodium: 138 mmol/L (ref 135–145)

## 2017-04-03 LAB — CBC
HCT: 25.6 % — ABNORMAL LOW (ref 40.0–52.0)
Hemoglobin: 8.5 g/dL — ABNORMAL LOW (ref 13.0–18.0)
MCH: 30.6 pg (ref 26.0–34.0)
MCHC: 33.1 g/dL (ref 32.0–36.0)
MCV: 92.6 fL (ref 80.0–100.0)
Platelets: 186 K/uL (ref 150–440)
RBC: 2.77 MIL/uL — ABNORMAL LOW (ref 4.40–5.90)
RDW: 14.5 % (ref 11.5–14.5)
WBC: 7.6 K/uL (ref 3.8–10.6)

## 2017-04-03 SURGERY — DIALYSIS/PERMA CATHETER INSERTION
Anesthesia: Moderate Sedation

## 2017-04-03 MED ORDER — MIDAZOLAM HCL 2 MG/2ML IJ SOLN
INTRAMUSCULAR | Status: DC | PRN
  Administered 2017-04-03: 2 mg via INTRAVENOUS

## 2017-04-03 MED ORDER — FENTANYL CITRATE (PF) 100 MCG/2ML IJ SOLN
INTRAMUSCULAR | Status: DC | PRN
  Administered 2017-04-03: 50 ug via INTRAVENOUS

## 2017-04-03 MED ORDER — HEPARIN (PORCINE) IN NACL 2-0.9 UNIT/ML-% IJ SOLN
INTRAMUSCULAR | Status: AC
  Filled 2017-04-03: qty 500

## 2017-04-03 MED ORDER — HEPARIN SODIUM (PORCINE) 1000 UNIT/ML IJ SOLN
INTRAMUSCULAR | Status: AC
  Filled 2017-04-03: qty 1

## 2017-04-03 MED ORDER — LIDOCAINE-EPINEPHRINE (PF) 2 %-1:200000 IJ SOLN
INTRAMUSCULAR | Status: AC
  Filled 2017-04-03: qty 20

## 2017-04-03 MED ORDER — HEPARIN SODIUM (PORCINE) 10000 UNIT/ML IJ SOLN
INTRAMUSCULAR | Status: AC
  Filled 2017-04-03: qty 1

## 2017-04-03 MED ORDER — FENTANYL CITRATE (PF) 100 MCG/2ML IJ SOLN
INTRAMUSCULAR | Status: AC
  Filled 2017-04-03: qty 2

## 2017-04-03 MED ORDER — MIDAZOLAM HCL 2 MG/2ML IJ SOLN
INTRAMUSCULAR | Status: AC
  Filled 2017-04-03: qty 2

## 2017-04-03 MED ORDER — SODIUM CHLORIDE 0.9 % IV SOLN: INTRAVENOUS | Status: DC

## 2017-04-03 SURGICAL SUPPLY — 8 items
CANNULA 5F STIFF (CANNULA) ×3 IMPLANT
CATH PALINDROME RT-P 15FX19CM (CATHETERS) ×3 IMPLANT
DERMABOND ADVANCED (GAUZE/BANDAGES/DRESSINGS) ×2
DERMABOND ADVANCED .7 DNX12 (GAUZE/BANDAGES/DRESSINGS) ×1 IMPLANT
DRAPE BRACHIAL (DRAPES) ×3 IMPLANT
PACK ANGIOGRAPHY (CUSTOM PROCEDURE TRAY) ×3 IMPLANT
SUT MNCRL AB 4-0 PS2 18 (SUTURE) ×3 IMPLANT
TOWEL OR 17X26 4PK STRL BLUE (TOWEL DISPOSABLE) ×3 IMPLANT

## 2017-04-03 NOTE — Progress Notes (Signed)
[place] at [place] NAME: Martin Gilbert    MR#:  [id number]  DATE OF BIRTH:  01/30/1941  Patient is very alert, awake, oriented,s/p permacath today.eating well,less nominal pain and diarrhea.   CHIEF COMPLAINT:   Chief Complaint  Patient presents with  . Abdominal Pain  . Diarrhea    REVIEW OF SYSTEMS:    Review of Systems  Constitutional: Negative for chills and fever.  HENT: Negative for hearing loss.   Eyes: Negative for blurred vision, double vision and photophobia.  Respiratory: Negative for cough, hemoptysis and shortness of breath.   Cardiovascular: Negative for palpitations, orthopnea and leg swelling.  Gastrointestinal: Negative for abdominal pain, diarrhea and vomiting.  Genitourinary: Negative for dysuria and urgency.  Musculoskeletal: Negative for myalgias and neck pain.  Skin: Negative for rash.  Neurological: Negative for dizziness, focal weakness, seizures, weakness and headaches.  Psychiatric/Behavioral: Negative for memory loss. The patient does not have insomnia.     Nutrition:  not Tolerating Diet: Tolerating PT:      DRUG ALLERGIES:  No Known Allergies  VITALS:  Blood pressure (!) 132/51, pulse 83, temperature 98.2 F (36.8 C), temperature source Oral, resp. rate 16, height 5\' 8"  (1.727 m), weight 75.7 kg (166 lb 14.4 oz), SpO2 93 %.  PHYSICAL EXAMINATION:   Physical Exam  GENERAL:  76 y.o.-year-old patient lying in the bed Moaning.  EYES: Pupils equal, round, reactive to light No scleral icterus. Extraocular muscles intact.  HEENT: Head atraumatic, normocephalic.  Oropharynx and nasopharynx clear.  NECK:  Supple, no jugular venous distention. No thyroid enlargement, no tenderness.  LUNGS: Normal breath sounds bilaterally, no wheezing, rales,rhonchi or crepitation. No use of accessory muscles of respiration.  CARDIOVASCULAR: S1, S2 normal. No murmurs, rubs, or gallops.  ABDOMEN:  grinning to palpating left side of abdomen EXTREMITIES: No pedal edema, cyanosis, or clubbing.  NEUROLOGIC:more alert,awake. PSYCHIATRIC:  More alert.  SKIN: No obvious rash, lesion, or ulcer.    LABORATORY PANEL:   CBC  Recent Labs Lab 04/03/17 0542  WBC 7.6  HGB 8.5*  HCT 25.6*  PLT 186   ------------------------------------------------------------------------------------------------------------------  Chemistries   Recent Labs Lab 03/28/17 1800  04/03/17 0542  NA 140  < > 138  K 3.5  < > 3.5  CL 110  < > 101  CO2 18*  < > 31  GLUCOSE 99  < > 116*  BUN 114*  < > 19  CREATININE 13.21*  < > 4.72*  CALCIUM 7.8*  < > 7.5*  AST 21  --   --   ALT 9*  --   --   ALKPHOS 74  --   --   BILITOT 0.6  --   --   < > = values in this interval not displayed. ------------------------------------------------------------------------------------------------------------------  Cardiac Enzymes  Recent Labs Lab 03/27/17 1732  TROPONINI 0.08*   ------------------------------------------------------------------------------------------------------------------  RADIOLOGY:  No results found.   ASSESSMENT AND PLAN:   Active Problems:   Anemia of chronic disease   Colitis   Demand ischemia (Martin Gilbert)   ARF (acute renal failure) ([name])   Enteritis due to Clostridium difficile   #1 .recurrent C. difficile colitis.;She is alert, awake, oriented, resume by mouth vancomycin and stopped IV Flagyl. The improving., Extended dose of vancomycin this time.  #2 lethargy;.at least multifactorial due to infection, renal failure, multiple medical problems with diabetes ,may ne underlying nutritional deficiency. Lethargy resolving, continue by mouth thiamine, folic acid. Continue to  treat infection, continue hemodialysis,  .  He  knows that he needs to be on dialysis and he agreed for permacath. Mental status improved. Continue thiamine, folic acid, permacath placed already, patient needs  associated now with hemodialysis,   #3 essential hypertension;; controlled  #4 diabetes mellitus type 2 ; continue SSI  With coverage  #5  pts daughter speaks good english and she translated.  Family happy with treatment and his overall improvement. #6 possible BPH:;continue flomax. Discontinue Foley catheter  .6 agitation: Use Risperdal. 7 ESRD: Outpatient dialysis on Tuesday, Thursday, Saturday first shift. At [facility]  has been offered.   plan for today ;discontinue Foley, physical therapy evaluation, likely discharge tomorrow depending on physical therapy  eval. Spoke to family,RN<nephro.   All the records are reviewed and case discussed with Care Management/Social Workerr. Management plans discussed with the patient, family and they are in agreement.  CODE STATUS: full  TOTAL TIME TAKING CARE OF THIS PATIENT: 35 minutes.   POSSIBLE D/C IN 7 DAYS, DEPENDING ON CLINICAL CONDITION.   Epifanio Lesches M.D on 04/03/2017 at 12:12 PM  Between 7am to 6pm - Pager - [phone]  After 6pm go to [email or web address] - password [email or web address] [facility]  Office  [phone]  CC: Primary care physician; [name], MD

## 2017-04-03 NOTE — Progress Notes (Signed)
[facility]  ROUNDING NOTE   Subjective:   Patient had tunneled dialysis catheter placed today Feels well.  Family at bedside. Denies acute complaints Still has Foley catheter in place  Objective:  Vital signs in last 24 hours:  Temp:  [98 F (36.7 C)-98.5 F (36.9 C)] 98.2 F (36.8 C) (04/19 1145) Pulse Rate:  [68-89] 83 (04/19 1145) Resp:  [13-23] 16 (04/19 1145) BP: (124-163)/(50-127) 132/51 (04/19 1145) SpO2:  [93 %-100 %] 93 % (04/19 1145) Weight:  [75.7 kg (166 lb 14.4 oz)-77.5 kg (170 lb 13.7 oz)] 75.7 kg (166 lb 14.4 oz) (04/19 0440)  Weight change:  Filed Weights   04/02/17 1458 04/02/17 1827 04/03/17 0440  Weight: 77.5 kg (170 lb 13.7 oz) 77.5 kg (170 lb 13.7 oz) 75.7 kg (166 lb 14.4 oz)    Intake/Output: I/O last 3 completed shifts: In: 1169.3 [I.V.:949.3; IV Piggyback:220] Out: 300 [Urine:700]   Intake/Output this shift:  Total I/O In: -  Out: 75 [Urine:75]  Physical Exam: General: Ill appearing  Head: Normocephalic, atraumatic. Moist oral mucosal membranes  Eyes: Anicteric,   Neck: Supple, trachea midline  Lungs:  Clear to auscultation  Heart: Regular rate and rhythm  Abdomen:  Soft, mild tenderness  Extremities:  no peripheral edema.  Neurologic: Alert and able to follow simple commands  Skin: No lesions  Access: Right femoral temp HD catheter 4/13 Dr. Lucky Gilbert    Basic Metabolic Panel:  Recent Labs Lab 03/28/17 1800 03/29/17 0521 03/31/17 1030 04/03/17 0542  NA 140 140  141 142 138  K 3.5 3.2*  3.3* 3.1* 3.5  CL 110 104  105 104 101  CO2 18* 27  28 27 31   GLUCOSE 99 110*  111* 72 116*  BUN 114* 75*  76* 56* 19  CREATININE 13.21* 9.47*  9.75* 8.71* 4.72*  CALCIUM 7.8* 7.4*  7.3* 7.3* 7.5*  PHOS 5.7* 4.9* 4.5  --     Liver Function Tests:  Recent Labs Lab 03/28/17 1800 03/29/17 0521 03/31/17 1030  AST 21  --   --   ALT 9*  --   --   ALKPHOS 74  --   --   BILITOT 0.6  --   --   PROT 5.0*  --   --   ALBUMIN  2.3* 2.2* 2.1*   No results for input(s): LIPASE, AMYLASE in the last 168 hours.  Recent Labs Lab 03/29/17 1326  AMMONIA 16    CBC:  Recent Labs Lab 03/28/17 0640 03/28/17 1800 03/29/17 0521 03/31/17 1030 04/03/17 0542  WBC 13.9* 12.9* 11.3* 11.7* 7.6  HGB 9.1* 8.9* 7.7* 8.1* 8.5*  HCT 28.2* 26.8* 23.3* 24.9* 25.6*  MCV 94.3 93.2 91.2 92.6 92.6  PLT 226 221 191 204 186    Cardiac Enzymes:  Recent Labs Lab 03/27/17 1732  TROPONINI 0.08*    BNP: Invalid input(s): POCBNP  CBG:  Recent Labs Lab 03/28/17 0733 03/29/17 0656 03/30/17 0806  GLUCAP 114* 105* 64    Microbiology: Results for orders placed or performed during the hospital encounter of 03/26/17  C difficile quick scan w PCR reflex     Status: Abnormal   Collection Time: 03/27/17  4:33 AM  Result Value Ref Range Status   C Diff antigen POSITIVE (A) NEGATIVE Final    Comment: CRITICAL RESULT CALLED TO, READ BACK BY AND VERIFIED WITH: Martin Gilbert ON 03/27/17 AT605 BY TLB    C Diff toxin POSITIVE (A) NEGATIVE Final   C Diff interpretation  Toxin producing C. difficile detected.  Final    Coagulation Studies: No results for input(s): LABPROT, INR in the last 72 hours.  Urinalysis: No results for input(s): COLORURINE, LABSPEC, PHURINE, GLUCOSEU, HGBUR, BILIRUBINUR, KETONESUR, PROTEINUR, UROBILINOGEN, NITRITE, LEUKOCYTESUR in the last 72 hours.  Invalid input(s): APPERANCEUR    Imaging: No results found.   Medications:    . amLODipine  2.5 mg Oral Daily  . aspirin EC  81 mg Oral Daily  . atorvastatin  20 mg Oral Daily  . chlorhexidine  15 mL Mouth Rinse BID  . feeding supplement (GLUCERNA SHAKE)  237 mL Oral TID BM  . ferrous sulfate  325 mg Oral Q breakfast  . folic acid  1 mg Oral Daily  . hydrALAZINE  50 mg Oral TID  . mouth rinse  15 mL Mouth Rinse q12n4p  . Melatonin  5 mg Oral QHS  . metoprolol succinate  25 mg Oral Daily  . multivitamin with minerals  1 tablet Oral Daily  .  pantoprazole  40 mg Oral Daily  . polyvinyl alcohol  1 drop Both Eyes BID  . sodium bicarbonate  650 mg Oral BID  . tamsulosin  0.4 mg Oral QPC supper  . thiamine  100 mg Oral Daily  . tuberculin  5 Units Intradermal Once  . vancomycin  125 mg Oral QID   Followed by  . [START ON 04/15/2017] vancomycin  125 mg Oral BID   Followed by  . [START ON 04/23/2017] vancomycin  125 mg Oral Daily   Followed by  . [START ON 04/30/2017] vancomycin  125 mg Oral QODAY   Followed by  . [START ON 05/08/2017] vancomycin  125 mg Oral Q3 days   acetaminophen **OR** acetaminophen, gabapentin, morphine injection, ondansetron (ZOFRAN) IV, ondansetron, promethazine  Assessment/ Plan:  [name] is a 76 y.o. Hispanic malewith diabetes mellitus type II, hypertension, hepatic cirrhosis, BPH, gout, diabetic neuropathy  1. End Stage Renal Disease: with proteinuria, metabolic acidosis and hypoalbuminemia.  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 Baseline creatinine of 6.23, GFR of 8 on 02/11/17.    - Foley was placed  (4/15)  for urinary retention and discomfort  - Started on hemodialysis on 4/13.  - Plan for dialysis outpatient at [facility] as that unit is closest to his house - patient to sit in the chair for dialysis tomorrow - PermCath placed on 4/19  - PPD placed 4/17  2.  Diabetes mellitus type II with chronic kidney disease: insulin dependent  3. Anemia of chronic kidney disease: gets outpatient Aranesp.  - EPO with HD   4. C diff Colitis: failure outpatient therapy for c. Diff colitis.    - management as per primary team  5. Encephalopathy: concerning for uremia, medication related.  - d/c baclofen - minimize neuro-active agents - seems to have helped, patient's mental status had improved significantly - melatonin for sleep at night   LOS: 7 Martin Gilbert 4/19/20183:56 PM

## 2017-04-03 NOTE — H&P (Signed)
[facility] History & Physical Update  The patient was interviewed and re-examined.  The patient's previous History and Physical has been reviewed and is unchanged.  There is no change in the plan of care. We plan to proceed with the scheduled procedure.  Leotis Pain, MD  04/03/2017, 9:44 AM

## 2017-04-03 NOTE — Plan of Care (Signed)
Problem: Activity: Goal: Risk for activity intolerance will decrease Outcome: Progressing Patient stood up for a minute on side of bed. Patient now sitting on side of bed with family at bedside. Encouraged patient to move to recliner. Patient says he wants to sit on edge of bed for awhile. No signs of bleeding at right femoral site where trialysis catheter was removed.

## 2017-04-03 NOTE — Op Note (Signed)
OPERATIVE NOTE    PRE-OPERATIVE DIAGNOSIS: 1. ESRD   POST-OPERATIVE DIAGNOSIS: same as above  PROCEDURE: 1. Ultrasound guidance for vascular access to the right internal jugular vein 2. Fluoroscopic guidance for placement of catheter 3. Placement of a 19 cm tip to cuff tunneled hemodialysis catheter via the right internal jugular vein  SURGEON: Leotis Pain, MD  ANESTHESIA:  Local with Moderate conscious sedation for approximately 15 minutes using 2 mg of Versed and 50 mcg of Fentanyl  ESTIMATED BLOOD LOSS: 10 cc  FLUORO TIME: less than one minute  CONTRAST: none  FINDING(S): 1.  Patent right internal jugular vein  SPECIMEN(S):  None  INDICATIONS:   Martin Gilbert is a 76 y.o.male who presents with progression of renal failure and now is felt to have ESRD.  The patient needs long term dialysis access for their ESRD, and a Permcath is necessary.  Risks and benefits are discussed and informed consent is obtained.    DESCRIPTION: After obtaining full informed written consent, the patient was brought back to the vascular suited. The patient's right neck and chest were sterilely prepped and draped in a sterile surgical field was created. Moderate conscious sedation was administered during a face to face encounter with the patient throughout the procedure with my supervision of the RN administering medicines and monitoring the patient's vital signs, pulse oximetry, telemetry and mental status throughout from the start of the procedure until the patient was taken to the recovery room.  The right internal jugular vein was visualized with ultrasound and found to be patent. It was then accessed under direct ultrasound guidance and a permanent image was recorded. A wire was placed. After skin nick and dilatation, the peel-away sheath was placed over the wire. I then turned my attention to an area under the clavicle. Approximately 1-2 fingerbreadths below the clavicle a small  counterincision was created and tunneled from the subclavicular incision to the access site. Using fluoroscopic guidance, a 19 centimeter tip to cuff tunneled hemodialysis catheter was selected, and tunneled from the subclavicular incision to the access site. It was then placed through the peel-away sheath and the peel-away sheath was removed. Using fluoroscopic guidance the catheter tips were parked in the right atrium. The appropriate distal connectors were placed. It withdrew blood well and flushed easily with heparinized saline and a concentrated heparin solution was then placed. It was secured to the chest wall with 2 Prolene sutures. The access incision was closed single 4-0 Monocryl. A 4-0 Monocryl pursestring suture was placed around the exit site. Sterile dressings were placed. The patient tolerated the procedure well and was taken to the recovery room in stable condition.  COMPLICATIONS: None  CONDITION: Stable  Leotis Pain, MD 04/03/2017 10:12 AM   This note was created with Dragon Medical transcription system. Any errors in dictation are purely unintentional.

## 2017-04-03 NOTE — Progress Notes (Signed)
Assessed and administered medications via Patent attorney. Family is at the bedside. Patient agrees to have translator present to help with education and questions.

## 2017-04-04 ENCOUNTER — Encounter: Payer: Self-pay | Admitting: Vascular Surgery

## 2017-04-04 MED ORDER — VANCOMYCIN 50 MG/ML ORAL SOLUTION: 125.0000 mg | ORAL | 0 refills | Status: DC

## 2017-04-04 MED ORDER — VANCOMYCIN 50 MG/ML ORAL SOLUTION: 125.0000 mg | Freq: Every day | ORAL | 0 refills | Status: DC

## 2017-04-04 MED ORDER — VANCOMYCIN 50 MG/ML ORAL SOLUTION: 125.0000 mg | Freq: Four times a day (QID) | ORAL | 0 refills | Status: DC

## 2017-04-04 MED ORDER — MELATONIN 5 MG PO TABS: 5.0000 mg | ORAL_TABLET | Freq: Every day | ORAL | 0 refills | Status: DC

## 2017-04-04 MED ORDER — VANCOMYCIN 50 MG/ML ORAL SOLUTION: 125.0000 mg | Freq: Two times a day (BID) | ORAL | 0 refills | Status: DC

## 2017-04-04 NOTE — Care Management Important Message (Signed)
Important Message  Patient Details  Name: Martin Gilbert MRN: [id number] Date of Birth: [birth year]   Medicare Important Message Given:  Yes  in [place], [place], RN 04/04/2017, 8:16 AM

## 2017-04-04 NOTE — Discharge Summary (Signed)
[facility] [place] NAME: Martin Gilbert    MR#:  [id number]  DATE OF BIRTH:  [birth year]  DATE OF ADMISSION:  03/26/2017   ADMITTING PHYSICIAN: Saundra Shelling, MD  DATE OF DISCHARGE: 04/04/2017  PRIMARY CARE PHYSICIAN: [name], MD   ADMISSION DIAGNOSIS:   Dehydration [E86.0] Colitis [K52.9] NSTEMI (non-ST elevated myocardial infarction) ([place]) [I21.4] Acute pancreatitis, unspecified complication status, unspecified pancreatitis type [K85.90] Acute renal failure superimposed on chronic kidney disease, unspecified CKD stage, unspecified acute renal failure type ([place]) [N17.9, N18.9]  DISCHARGE DIAGNOSIS:   Active Problems:   Anemia of chronic disease   Colitis   Demand ischemia ([place])   ARF (acute renal failure) ([name])   Enteritis due to Clostridium difficile   SECONDARY DIAGNOSIS:   Past Medical History:  Diagnosis Date  . Back pain   . BPH (benign prostatic hyperplasia)   . CKD (chronic kidney disease), stage IV ([place])   . Diabetes mellitus without complication ([place])   . HTN (hypertension)   . Hyperlipidemia     HOSPITAL COURSE:   76 year old male with past medical history significant for CK D stage IV, type 2 diabetes, hypertension, hyperlipidemia, BPH, history of C. difficile colitis in March 2018 comes to the emergency room secondary to abdominal pain and noted to have recurrent C. difficile infection. Also noted to be in progressive kidney disease.  #1 CKD stage 4 progressed to ESRD- appreciate nephrology consult - started on hemodialysis on 03/28/17. Has a right chest permacath placed on 04/03/17 and started on HD- Tuesday, Thursday and Saturday schedule as outpatient starting 04/08/17- at [facility]. - tolerating dialysis well  #2 Recurrent C.diff colitis- improved symptoms now - appreciate GI consult - on pulse dose vancomycin orally- after 2 weeks trt- taper off for over a month If recurrs  again- needs a stool transplant  #3 Acute metabolic encephalopathy and uremic encephalopathy- appreciate neuro input - resolved now, alert and oriented  #4 DM- hold glipizide, monitor sugars and carb controlled diet  #5 HTN- well controlled, continue home meds  #6 BPH- flomax and cardura  PT recommended rehab- [facility]  DISCHARGE CONDITIONS:   Guarded  CONSULTS OBTAINED:   Treatment Team:  Lavonia Dana, MD Sela Hua, PA-C Alexis Goodell, MD  DRUG ALLERGIES:   No Known Allergies DISCHARGE MEDICATIONS:   Allergies as of 04/04/2017   No Known Allergies     Medication List    STOP taking these medications   baclofen 10 MG tablet Commonly known as:  LIORESAL   carbamide peroxide 6.5 % otic solution Commonly known as:  DEBROX   glipiZIDE 5 MG tablet Commonly known as:  GLUCOTROL     TAKE these medications   acetaminophen 500 MG tablet Commonly known as:  TYLENOL Take 500 mg by mouth every 8 (eight) hours as needed.   allopurinol 100 MG tablet Commonly known as:  ZYLOPRIM Take 100 mg by mouth daily.   amLODipine 2.5 MG tablet Commonly known as:  NORVASC Take 2.5 mg by mouth daily.   aspirin 81 MG tablet Take 81 mg by mouth daily.   atorvastatin 20 MG tablet Commonly known as:  LIPITOR Take 20 mg by mouth daily.   doxazosin 2 MG tablet Commonly known as:  CARDURA Take 2 mg by mouth at bedtime.   feeding supplement (GLUCERNA SHAKE) Liqd Take 237 mLs by mouth 3 (three) times daily between meals.   ferrous  sulfate 325 (65 FE) MG tablet Take 325 mg by mouth daily with breakfast.   gabapentin 100 MG capsule Commonly known as:  NEURONTIN Take 1 capsule (100 mg total) by mouth every 8 (eight) hours as needed (tingling or numbness in feet).   hydrALAZINE 50 MG tablet Commonly known as:  APRESOLINE Take 50 mg by mouth 3 (three) times daily.   Melatonin 5 MG Tabs Take 1 tablet (5 mg total) by mouth at  bedtime.   metoprolol succinate 25 MG 24 hr tablet Commonly known as:  TOPROL-XL Take 25 mg by mouth daily.   MULTI-VITAMINS Tabs Take 1 tablet by mouth daily.   ondansetron 4 MG tablet Commonly known as:  ZOFRAN Take 1 tablet (4 mg total) by mouth every 6 (six) hours as needed for nausea.   pantoprazole 40 MG tablet Commonly known as:  PROTONIX Take 1 tablet (40 mg total) by mouth daily.   REFRESH OP Place 1 drop into both eyes 2 (two) times daily.   sodium bicarbonate 650 MG tablet Take 650 mg by mouth 2 (two) times daily.   tamsulosin 0.4 MG Caps capsule Commonly known as:  FLOMAX Take 1 capsule (0.4 mg total) by mouth daily after supper.   vancomycin 50 mg/mL oral solution Commonly known as:  VANCOCIN Take 2.5 mLs (125 mg total) by mouth 4 (four) times daily. Until 04/14/17 What changed:  when to take this  additional instructions   vancomycin 50 mg/mL oral solution Commonly known as:  VANCOCIN Take 2.5 mLs (125 mg total) by mouth 2 (two) times daily. For 1 week starting 04/15/17 Start taking on:  04/15/2017 What changed:  You were already taking a medication with the same name, and this prescription was added. Make sure you understand how and when to take each.   vancomycin 50 mg/mL oral solution Commonly known as:  VANCOCIN Take 2.5 mLs (125 mg total) by mouth daily. X 1 week starting 04/22/17 Start taking on:  04/22/2017 What changed:  You were already taking a medication with the same name, and this prescription was added. Make sure you understand how and when to take each.   vancomycin 50 mg/mL oral solution Commonly known as:  VANCOCIN Take 2.5 mLs (125 mg total) by mouth every other day. Starting 04/30/17 Start taking on:  04/30/2017 What changed:  You were already taking a medication with the same name, and this prescription was added. Make sure you understand how and when to take each.   vancomycin 50 mg/mL oral solution Commonly known as:  VANCOCIN Take 2.5  mLs (125 mg total) by mouth every 3 (three) days. X 1 week starting 05/08/17 Start taking on:  05/08/2017 What changed:  You were already taking a medication with the same name, and this prescription was added. Make sure you understand how and when to take each.   Vitamin D (Cholecalciferol) 1000 units Tabs Take by mouth.        DISCHARGE INSTRUCTIONS:   1. PCP f/u in 2 weeks 2. F/u for dialysis on 04/08/17  DIET:   Renal diet  ACTIVITY:   Activity as tolerated  OXYGEN:   Home Oxygen: No.  Oxygen Delivery: room air  DISCHARGE LOCATION:   nursing home   If you experience worsening of your admission symptoms, develop shortness of breath, life threatening emergency, suicidal or homicidal thoughts you must seek medical attention immediately by calling 911 or calling your MD immediately  if symptoms less severe.  You Must read complete  instructions/literature along with all the possible adverse reactions/side effects for all the Medicines you take and that have been prescribed to you. Take any new Medicines after you have completely understood and accpet all the possible adverse reactions/side effects.   Please note  You were cared for by a hospitalist during your hospital stay. If you have any questions about your discharge medications or the care you received while you were in the hospital after you are discharged, you can call the unit and asked to speak with the hospitalist on call if the hospitalist that took care of you is not available. Once you are discharged, your primary care physician will handle any further medical issues. Please note that NO REFILLS for any discharge medications will be authorized once you are discharged, as it is imperative that you return to your primary care physician (or establish a relationship with a primary care physician if you do not have one) for your aftercare needs so that they can reassess your need for medications and monitor your lab  values.    On the day of Discharge:  VITAL SIGNS:   Blood pressure 112/64, pulse 84, temperature 98.7 F (37.1 C), temperature source Oral, resp. rate 16, height 5\' 8"  (1.727 m), weight 75.7 kg (166 lb 14.2 oz), SpO2 94 %.  PHYSICAL EXAMINATION:    GENERAL:  76 y.o.-year-old patient lying in the bed with no acute distress. Spanish speaking- family in room and interprets for him EYES: Pupils equal, round, reactive to light and accommodation. No scleral icterus. Extraocular muscles intact.  HEENT: Head atraumatic, normocephalic. Oropharynx and nasopharynx clear.  NECK:  Supple, no jugular venous distention. No thyroid enlargement, no tenderness.  LUNGS: Normal breath sounds bilaterally, no wheezing, rales,rhonchi or crepitation. No use of accessory muscles of respiration.  CARDIOVASCULAR: S1, S2 normal. No murmurs, rubs, or gallops. Right chest permacath present ABDOMEN: Soft, non-tender, non-distended. Bowel sounds present. No organomegaly or mass.  EXTREMITIES: No pedal edema, cyanosis, or clubbing.  NEUROLOGIC: Cranial nerves II through XII are intact. Muscle strength 5/5 in all extremities. Sensation intact. Gait not checked.  PSYCHIATRIC: The patient is alert and oriented x 3.  SKIN: No obvious rash, lesion, or ulcer.   DATA REVIEW:   CBC  Recent Labs Lab 04/03/17 0542  WBC 7.6  HGB 8.5*  HCT 25.6*  PLT 186    Chemistries   Recent Labs Lab 03/28/17 1800  04/03/17 0542  NA 140  < > 138  K 3.5  < > 3.5  CL 110  < > 101  CO2 18*  < > 31  GLUCOSE 99  < > 116*  BUN 114*  < > 19  CREATININE 13.21*  < > 4.72*  CALCIUM 7.8*  < > 7.5*  AST 21  --   --   ALT 9*  --   --   ALKPHOS 74  --   --   BILITOT 0.6  --   --   < > = values in this interval not displayed.   Microbiology Results  Results for orders placed or performed during the hospital encounter of 03/26/17  C difficile quick scan w PCR reflex     Status: Abnormal   Collection Time: 03/27/17  4:33 AM   Result Value Ref Range Status   C Diff antigen POSITIVE (A) NEGATIVE Final    Comment: CRITICAL RESULT CALLED TO, READ BACK BY AND VERIFIED WITH: ROBERT Martinique ON 03/27/17 AT605 BY TLB    C Diff  toxin POSITIVE (A) NEGATIVE Final   C Diff interpretation Toxin producing C. difficile detected.  Final    RADIOLOGY:  No results found.   Management plans discussed with the patient, family and they are in agreement.  CODE STATUS:     Code Status Orders        Start     Ordered   03/27/17 0543  Full code  Continuous     03/27/17 0542    Code Status History    Date Active Date Inactive Code Status Order ID Comments User Context   02/15/2017  9:11 AM 02/19/2017  6:04 PM Full Code [id number]  Saundra Shelling, MD Inpatient   01/31/2017  2:36 AM 02/02/2017  8:22 PM Full Code [id number]  Saundra Shelling, MD ED      TOTAL TIME TAKING CARE OF THIS PATIENT: 38 minutes.    Evangelyn Crouse M.D on 04/04/2017 at 11:51 AM  Between 7am to 6pm - Pager - [phone]  After 6pm go to [email or web address] - Proofreader  [facility]  Office  [phone]  CC: Primary care physician; [name], MD   Note: This dictation was prepared with Dragon dictation along with smaller phrase technology. Any transcriptional errors that result from this process are unintentional.

## 2017-04-04 NOTE — Progress Notes (Signed)
[name] has been attending to his father [name] who has been sick and admitted to [facility] from 03/27/17 to today 04/04/17. So please excuse him from work for the above mentioned days.  Please call if any questions.  Thank you.  Gladstone Lighter, M.D [facility] [place], [place] [address] Ph: [phone]

## 2017-04-04 NOTE — Progress Notes (Signed)
Discharge instructions given to patient via telephone with daughter [name] and with interpreter at bedside for assistance if needed. Patient verbalized understanding. No distress at this time. Denies and needs or further questions. Report called to facility. Awaiting EMS for transportation.

## 2017-04-04 NOTE — Clinical Social Work Note (Addendum)
CSW spoke with patient's daughter [name], [phone] and CSW presented bed offers, they have chosen [facility].  CSW contacted [facility] who said they can accept patient today.  Patient's family states they would prefer EMS to transport him to SNF.  Patient to be d/c'ed today to [facility]  Patient and family agreeable to plans will transport via ems RN to call report [phone], room 802.   Jones Broom. [place], MSW, [place]  04/04/2017 12:06 PM

## 2017-04-04 NOTE — NC FL2 (Addendum)
[place] LEVEL OF CARE SCREENING TOOL     IDENTIFICATION  Patient Name: Martin Gilbert Birthdate: [birth year] Sex: male Admission Date (Current Location): 03/26/2017  [place] and [place] Number:  Selena Lesser [id number] [place] and Address:  [facility], [address] [place], [place] [address]      Provider Number: [id number]  Attending Physician Name and Address:  Gladstone Lighter, MD  Relative Name and Phone Number:  Marlyce Huge [phone] or [facility] Daughter [phone], or  Filiberto Pinks [phone]     Current Level of Care: Hospital Recommended Level of Care: [place] Prior Approval Number:    Date Approved/Denied:   PASRR Number: [id number]  Discharge Plan: SNF    Current Diagnoses: Patient Active Problem List   Diagnosis Date Noted  . Colitis 03/27/2017  . Demand ischemia ([place]) 03/27/2017  . ARF (acute renal failure) ([place]) 03/27/2017  . Enteritis due to Clostridium difficile   . Protein-calorie malnutrition, severe 02/18/2017  . C. difficile colitis 02/15/2017  . UTI (urinary tract infection) 02/02/2017  . E coli infection 02/02/2017  . Lactic acidosis 02/02/2017  . Diabetic neuropathy ([place]) 02/02/2017  . Left-sided chest wall pain 02/02/2017  . Fall 02/02/2017  . Generalized weakness 02/02/2017  . Pink eye, left 02/02/2017  . Leukocytosis 02/02/2017  . Anemia of chronic disease 02/02/2017  . Nausea 02/02/2017  . Sepsis ([place]) 01/31/2017  . Essential hypertension 11/28/2015  . Pain in the chest 02/15/2015  . Neck pain 02/15/2015  . Diabetes type 2, uncontrolled ([place]) 02/15/2015  . Chronic kidney disease, stage IV (severe) ([place]) 02/15/2015  . Frequent headaches 02/15/2015    Orientation RESPIRATION BLADDER Height & Weight     Self, Time, Situation, Place  Normal Incontinent Weight: 166 lb 14.2 oz (75.7 kg) Height:  5\' 8"  (172.7 cm)  BEHAVIORAL  SYMPTOMS/MOOD NEUROLOGICAL BOWEL NUTRITION STATUS      Continent Diet (Renal Carb diet)  AMBULATORY STATUS COMMUNICATION OF NEEDS Skin   Limited Assist Verbally Normal                       Personal Care Assistance Level of Assistance  Bathing Bathing Assistance: Limited assistance         Functional Limitations Info  Sight, Hearing, Speech Sight Info: Adequate Hearing Info: Adequate Speech Info: Adequate    SPECIAL CARE FACTORS FREQUENCY  PT (By licensed PT)     PT Frequency: 5x a week OT Frequency: 5x a week            Contractures Contractures Info: Not present    Additional Factors Info  Code Status, Allergies Code Status Info: Full Code Allergies Info: NKA           Current Medications (04/04/2017):  This is the current hospital active medication list Current Facility-Administered Medications  Medication Dose Route Frequency Provider Last Rate Last Dose  . acetaminophen (TYLENOL) tablet 650 mg  650 mg Oral Q6H PRN Saundra Shelling, MD   650 mg at 04/03/17 1828   Or  . acetaminophen (TYLENOL) suppository 650 mg  650 mg Rectal Q6H PRN Pavan Pyreddy, MD      . amLODipine (NORVASC) tablet 2.5 mg  2.5 mg Oral Daily Pavan Pyreddy, MD   2.5 mg at 04/03/17 1151  . aspirin EC tablet 81 mg  81 mg Oral Daily Pavan Pyreddy, MD   81 mg at 04/03/17 1150  . atorvastatin (LIPITOR) tablet 20 mg  20 mg Oral Daily  Saundra Shelling, MD   20 mg at 04/03/17 1812  . chlorhexidine (PERIDEX) 0.12 % solution 15 mL  15 mL Mouth Rinse BID Epifanio Lesches, MD   15 mL at 04/03/17 2230  . feeding supplement (GLUCERNA SHAKE) (GLUCERNA SHAKE) liquid 237 mL  237 mL Oral TID BM Saundra Shelling, MD   237 mL at 04/03/17 2013  . ferrous sulfate tablet 325 mg  325 mg Oral Q breakfast Saundra Shelling, MD   325 mg at 04/03/17 1150  . folic acid (FOLVITE) tablet 1 mg  1 mg Oral Daily Epifanio Lesches, MD   1 mg at 04/03/17 1152  . gabapentin (NEURONTIN) capsule 100 mg  100 mg Oral Q8H PRN Pavan  Pyreddy, MD      . hydrALAZINE (APRESOLINE) tablet 50 mg  50 mg Oral TID Saundra Shelling, MD   50 mg at 04/03/17 2229  . MEDLINE mouth rinse  15 mL Mouth Rinse q12n4p Epifanio Lesches, MD      . Melatonin TABS 5 mg  5 mg Oral QHS Murlean Iba, MD   5 mg at 04/03/17 2235  . metoprolol succinate (TOPROL-XL) 24 hr tablet 25 mg  25 mg Oral Daily Saundra Shelling, MD   25 mg at 04/03/17 1152  . morphine 4 MG/ML injection 1 mg  1 mg Intravenous Q4H PRN Alexis Hugelmeyer, DO   1 mg at 03/30/17 2040  . multivitamin with minerals tablet 1 tablet  1 tablet Oral Daily Saundra Shelling, MD   1 tablet at 04/03/17 1152  . ondansetron (ZOFRAN) injection 4 mg  4 mg Intravenous Q6H PRN Epifanio Lesches, MD   4 mg at 03/27/17 1541  . ondansetron (ZOFRAN) tablet 4 mg  4 mg Oral Q6H PRN Pavan Pyreddy, MD      . pantoprazole (PROTONIX) EC tablet 40 mg  40 mg Oral Daily Saundra Shelling, MD   40 mg at 04/03/17 1151  . polyvinyl alcohol (LIQUIFILM TEARS) 1.4 % ophthalmic solution 1 drop  1 drop Both Eyes BID Epifanio Lesches, MD   1 drop at 04/03/17 2230  . promethazine (PHENERGAN) injection 12.5 mg  12.5 mg Intravenous Q6H PRN Epifanio Lesches, MD   12.5 mg at 03/27/17 1855  . sodium bicarbonate tablet 650 mg  650 mg Oral BID Saundra Shelling, MD   650 mg at 04/03/17 2229  . tamsulosin (FLOMAX) capsule 0.4 mg  0.4 mg Oral QPC supper Saundra Shelling, MD   0.4 mg at 04/03/17 1812  . thiamine (VITAMIN B-1) tablet 100 mg  100 mg Oral Daily Epifanio Lesches, MD   100 mg at 04/03/17 1150  . vancomycin (VANCOCIN) 50 mg/mL oral solution 125 mg  125 mg Oral QID Epifanio Lesches, MD   125 mg at 04/03/17 2235   Followed by  . [START ON 04/15/2017] vancomycin (VANCOCIN) 50 mg/mL oral solution 125 mg  125 mg Oral BID Epifanio Lesches, MD       Followed by  . [START ON 04/23/2017] vancomycin (VANCOCIN) 50 mg/mL oral solution 125 mg  125 mg Oral Daily Epifanio Lesches, MD       Followed by  . [START ON 04/30/2017] vancomycin  (VANCOCIN) 50 mg/mL oral solution 125 mg  125 mg Oral QODAY Epifanio Lesches, MD       Followed by  . [START ON 05/08/2017] vancomycin (VANCOCIN) 50 mg/mL oral solution 125 mg  125 mg Oral Q3 days Epifanio Lesches, MD         Discharge Medications: Please see discharge summary  for a list of discharge medications.  Relevant Imaging Results:  Relevant Lab Results:   Additional Information SSN [id number]  Dialysis Tuesday Thursday Saturday  First shift [facility] [place], [place]

## 2017-04-04 NOTE — Evaluation (Addendum)
Physical Therapy Evaluation Patient Details Name: Martin Gilbert MRN: [id number] DOB: 02-28-1941 Today's Date: 04/04/2017   History of Present Illness  Martin Gilbert  is a 76 y.o. male with a known history of chronic kidney disease stage IV, type 2 diabetes mellitus, hypertension, hyperlipidemia, benign prostate hypertrophy, C. difficile colitis presented to the emergency room with abdominal discomfort and diarrhea. Patient had loose stool for the last 2 weeks. He has abdominal discomfort which is aching in nature around the umbilicus 6 out of 10 on a scale of 1-10. Patient is Spanish speaking. No complaints of any shortness of breath. Had an episode of chest pain which is sharp in nature prior to presentation to the emergency room. The pain was 3 out of 10 on a scale of 1-10. Patient was recently treated for C. difficile colitis at our hospital and discharged on 02/19/2017. His troponin was elevated and creatinine was also elevated and around 11.5 and BUN was 97. Patient was started on IV heparin drip for non-ST elevation MI in the emergency room. He was also given IV ciprofloxacin and IV Flagyl antibiotics in the emergency room after he was worked up with a CT abdomen which showed colitis. No history of any hematemesis, hemoptysis and rectal bleed. Pt is now admitted for recurrent c.diff colitis and lethargy. His mental status was impaired during admission but improved at time of PT evaluation  Clinical Impression  Pt admitted with above diagnosis. Pt currently with functional limitations due to the deficits listed below (see PT Problem List).  Pt is very weak and unsteady. He requires modA+1 to come to standing and minA+1 for limited ambulation in room. Pt with multiple bouts of losing his balance requiring assist from therapist to prevent him from falling. He is a high fall risk and would most certainly fall if he attempted to return home. He will need SNF placement to receive PT services  prior to safe transition back home. Patient and family in agreement. Care manager notified of recommendation. Pt will benefit from skilled PT services to address deficits in strength, balance, and mobility in order to return to full function at home.     Follow Up Recommendations SNF    Equipment Recommendations  None recommended by PT    Recommendations for Other Services       Precautions / Restrictions Precautions Precautions: Fall Restrictions Weight Bearing Restrictions: No      Mobility  Bed Mobility               General bed mobility comments: Recieved and left upright in recliner  Transfers Overall transfer level: Needs assistance Equipment used: Rolling walker (2 wheeled) Transfers: Sit to/from Stand Sit to Stand: Mod assist         General transfer comment: Pt is very weak requiring assist to come to standing. Cues for hand placement. Once upright pt is unsteady and requires therapist support to prevent him from falling backwards back onto recliner  Ambulation/Gait Ambulation/Gait assistance: Min assist Ambulation Distance (Feet): 40 Feet Assistive device: Rolling walker (2 wheeled) Gait Pattern/deviations: Decreased step length - right;Decreased step length - left Gait velocity: Decreased Gait velocity interpretation: <1.8 ft/sec, indicative of risk for recurrent falls General Gait Details: Pt ambulates to from recliner to door and back. He is very unsteady requiring frequent minA+1 correction from therapist to prevent him from falling. Pt very unsteady during turns. He requires direction from therapist in order to remain close to walker and for sequencing with turns  Stairs            Wheelchair Mobility    Modified Rankin (Stroke Patients Only)       Balance Overall balance assessment: Needs assistance Sitting-balance support: No upper extremity supported Sitting balance-Leahy Scale: Good     Standing balance support: No upper  extremity supported Standing balance-Leahy Scale: Poor Standing balance comment: Pt unable to maintain upright balance without UE support and assist from therapist                             Pertinent Vitals/Pain Pain Assessment: No/denies pain    Home Living Family/patient expects to be discharged to:: Private residence Living Arrangements: Spouse/significant other Available Help at Discharge: Family;Available 24 hours/day Type of Home: House Home Access: Stairs to enter Entrance Stairs-Rails: Right Entrance Stairs-Number of Steps: 3 Home Layout: One level Home Equipment: Walker - 2 wheels      Prior Function Level of Independence: Needs assistance   Gait / Transfers Assistance Needed: Ambulates with rolling walker at home  ADL's / Homemaking Assistance Needed: Independent with ADLs and IADLs with use of rolling walker        Hand Dominance   Dominant Hand: Right    Extremity/Trunk Assessment   Upper Extremity Assessment Upper Extremity Assessment: Generalized weakness    Lower Extremity Assessment Lower Extremity Assessment: Generalized weakness       Communication   Communication: Interpreter utilized [facility] spanish-language interpreter: Priscella Mann)  Cognition Arousal/Alertness: Awake/alert Behavior During Therapy: WFL for tasks assessed/performed Overall Cognitive Status: Impaired/Different from baseline Area of Impairment: Orientation                 Orientation Level: Disoriented to;Time (Per family, mental status has not returned to baseline)                    General Comments      Exercises     Assessment/Plan    PT Assessment Patient needs continued PT services  PT Problem List Decreased strength;Decreased balance       PT Treatment Interventions DME instruction;Gait training;Stair training;Therapeutic activities;Therapeutic exercise;Functional mobility training;Balance training;Neuromuscular  re-education;Patient/family education    PT Goals (Current goals can be found in the Care Plan section)  Acute Rehab PT Goals Patient Stated Goal: Return to prior level of function at home PT Goal Formulation: With patient/family Time For Goal Achievement: 04/18/17 Potential to Achieve Goals: Good    Frequency Min 2X/week   Barriers to discharge Decreased caregiver support Lives with wife only who is also elderly. However has good support from his daughter    Co-evaluation               End of Session Equipment Utilized During Treatment: Gait belt Activity Tolerance: Patient tolerated treatment well Patient left: in chair;with call bell/phone within reach;with family/visitor present;Other (comment) (Care manager in room) Nurse Communication: Mobility status;Other (comment) (Care manager notified of DC recommendations) PT Visit Diagnosis: Unsteadiness on feet (R26.81);Muscle weakness (generalized) (M62.81)    Time: 6073-7106 PT Time Calculation (min) (ACUTE ONLY): 35 min   Charges:   PT Evaluation $PT Eval Moderate Complexity: 1 Procedure PT Treatments $Gait Training: 8-22 mins   PT G Codes:       Lyndel Safe Huprich PT, DPT    Huprich,Jason 04/04/2017, 10:18 AM

## 2017-04-04 NOTE — Progress Notes (Signed)
[facility]  ROUNDING NOTE   Subjective:   Patient seen during dialysis Tolerating well   HEMODIALYSIS FLOWSHEET:  Blood Flow Rate (mL/min): 400 mL/min Arterial Pressure (mmHg): -160 mmHg Venous Pressure (mmHg): 120 mmHg Transmembrane Pressure (mmHg): 40 mmHg Ultrafiltration Rate (mL/min): 140 mL/min Dialysate Flow Rate (mL/min): 800 ml/min Conductivity: Machine : 13.5 Conductivity: Machine : 13.5 Dialysis Fluid Bolus: Normal Saline Bolus Amount (mL): 250 mL Dialysate Change: 4K Intra-Hemodialysis Comments: tolerating tx well    Objective:  Vital signs in last 24 hours:  Temp:  [98 F (36.7 C)-98.7 F (37.1 C)] 98 F (36.7 C) (04/20 1416) Pulse Rate:  [67-84] 79 (04/20 1416) Resp:  [15-19] 18 (04/20 1416) BP: (112-143)/(52-81) 126/58 (04/20 1416) SpO2:  [94 %-98 %] 98 % (04/20 1416) Weight:  [75.7 kg (166 lb 14.2 oz)] 75.7 kg (166 lb 14.2 oz) (04/20 1416)  Weight change:  Filed Weights   04/03/17 0440 04/04/17 1040 04/04/17 1416  Weight: 75.7 kg (166 lb 14.4 oz) 75.7 kg (166 lb 14.2 oz) 75.7 kg (166 lb 14.2 oz)    Intake/Output: I/O last 3 completed shifts: In: 25 [IV Piggyback:50] Out: 525 [Urine:525]   Intake/Output this shift:  No intake/output data recorded.  Physical Exam: General: NAD, dialysis in chair  Head: Normocephalic, atraumatic. Moist oral mucosal membranes  Eyes: Anicteric,   Neck: Supple, trachea midline  Lungs:  Clear to auscultation  Heart: Regular rate and rhythm  Abdomen:  Soft, mild tenderness  Extremities:  no peripheral edema.  Neurologic: Alert and able to follow simple commands  Skin: No lesions  Access: Rt IJ PC    Basic Metabolic Panel:  Recent Labs Lab 03/28/17 1800 03/29/17 0521 03/31/17 1030 04/03/17 0542  NA 140 140  141 142 138  K 3.5 3.2*  3.3* 3.1* 3.5  CL 110 104  105 104 101  CO2 18* 27  28 27 31   GLUCOSE 99 110*  111* 72 116*  BUN 114* 75*  76* 56* 19  CREATININE 13.21* 9.47*   9.75* 8.71* 4.72*  CALCIUM 7.8* 7.4*  7.3* 7.3* 7.5*  PHOS 5.7* 4.9* 4.5  --     Liver Function Tests:  Recent Labs Lab 03/28/17 1800 03/29/17 0521 03/31/17 1030  AST 21  --   --   ALT 9*  --   --   ALKPHOS 74  --   --   BILITOT 0.6  --   --   PROT 5.0*  --   --   ALBUMIN 2.3* 2.2* 2.1*   No results for input(s): LIPASE, AMYLASE in the last 168 hours.  Recent Labs Lab 03/29/17 1326  AMMONIA 16    CBC:  Recent Labs Lab 03/28/17 1800 03/29/17 0521 03/31/17 1030 04/03/17 0542  WBC 12.9* 11.3* 11.7* 7.6  HGB 8.9* 7.7* 8.1* 8.5*  HCT 26.8* 23.3* 24.9* 25.6*  MCV 93.2 91.2 92.6 92.6  PLT 221 191 204 186    Cardiac Enzymes: No results for input(s): CKTOTAL, CKMB, CKMBINDEX, TROPONINI in the last 168 hours.  BNP: Invalid input(s): POCBNP  CBG:  Recent Labs Lab 03/29/17 0656 03/30/17 0806  GLUCAP 105* 88    Microbiology: Results for orders placed or performed during the hospital encounter of 03/26/17  C difficile quick scan w PCR reflex     Status: Abnormal   Collection Time: 03/27/17  4:33 AM  Result Value Ref Range Status   C Diff antigen POSITIVE (A) NEGATIVE Final    Comment: CRITICAL RESULT CALLED TO,  READ BACK BY AND VERIFIED WITH: Martin Gilbert ON 03/27/17 AT605 BY TLB    C Diff toxin POSITIVE (A) NEGATIVE Final   C Diff interpretation Toxin producing C. difficile detected.  Final    Coagulation Studies: No results for input(s): LABPROT, INR in the last 72 hours.  Urinalysis: No results for input(s): COLORURINE, LABSPEC, PHURINE, GLUCOSEU, HGBUR, BILIRUBINUR, KETONESUR, PROTEINUR, UROBILINOGEN, NITRITE, LEUKOCYTESUR in the last 72 hours.  Invalid input(s): APPERANCEUR    Imaging: No results found.   Medications:    . amLODipine  2.5 mg Oral Daily  . aspirin EC  81 mg Oral Daily  . atorvastatin  20 mg Oral Daily  . chlorhexidine  15 mL Mouth Rinse BID  . feeding supplement (GLUCERNA SHAKE)  237 mL Oral TID BM  . ferrous sulfate   325 mg Oral Q breakfast  . folic acid  1 mg Oral Daily  . hydrALAZINE  50 mg Oral TID  . mouth rinse  15 mL Mouth Rinse q12n4p  . Melatonin  5 mg Oral QHS  . metoprolol succinate  25 mg Oral Daily  . multivitamin with minerals  1 tablet Oral Daily  . pantoprazole  40 mg Oral Daily  . polyvinyl alcohol  1 drop Both Eyes BID  . sodium bicarbonate  650 mg Oral BID  . tamsulosin  0.4 mg Oral QPC supper  . thiamine  100 mg Oral Daily  . vancomycin  125 mg Oral QID   Followed by  . [START ON 04/15/2017] vancomycin  125 mg Oral BID   Followed by  . [START ON 04/23/2017] vancomycin  125 mg Oral Daily   Followed by  . [START ON 04/30/2017] vancomycin  125 mg Oral QODAY   Followed by  . [START ON 05/08/2017] vancomycin  125 mg Oral Q3 days   acetaminophen **OR** acetaminophen, gabapentin, morphine injection, ondansetron (ZOFRAN) IV, ondansetron, promethazine  Assessment/ Plan:  [name] is a 76 y.o. Hispanic malewith diabetes mellitus type II, hypertension, hepatic cirrhosis, BPH, gout, diabetic neuropathy  1. End Stage Renal Disease: with proteinuria, metabolic acidosis and hypoalbuminemia.  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 Baseline creatinine of 6.23, GFR of 8 on 02/11/17.    - Foley was placed  (4/15)  for urinary retention and discomfort  - Started on hemodialysis on 4/13.  - Plan for dialysis outpatient at [facility] as that unit is closest to his house - patient is able to sit in the chair for dialysis   - PermCath placed on 4/19  - PPD placed 4/17  2.  Diabetes mellitus type II with chronic kidney disease: insulin dependent  3. Anemia of chronic kidney disease: gets outpatient Aranesp.  - EPO with HD   4. C diff Colitis: failure outpatient therapy for c. Diff colitis.    - management as per primary team  5. Encephalopathy: concerning for uremia, medication related.  - d/c baclofen - minimize neuro-active  agents - seems to have helped, patient's mental status had improved significantly - melatonin for sleep at night   LOS: 8 Martin Gilbert 4/20/20182:30 PM

## 2017-04-04 NOTE — Progress Notes (Signed)
   04/03/17 2015 04/03/17 2016  PPD Results  Does patient have an induration at the injection site? No No  Induration(mm) 0 mm 0 mm  Name of Physician Notified n/a n/a

## 2017-05-29 ENCOUNTER — Other Ambulatory Visit (INDEPENDENT_AMBULATORY_CARE_PROVIDER_SITE_OTHER): Payer: Self-pay | Admitting: Internal Medicine

## 2017-05-29 DIAGNOSIS — N185 Chronic kidney disease, stage 5: Secondary | ICD-10-CM

## 2017-05-30 ENCOUNTER — Ambulatory Visit (INDEPENDENT_AMBULATORY_CARE_PROVIDER_SITE_OTHER): Payer: Medicare Other | Admitting: Vascular Surgery

## 2017-05-30 ENCOUNTER — Encounter (INDEPENDENT_AMBULATORY_CARE_PROVIDER_SITE_OTHER): Payer: Self-pay

## 2017-05-30 ENCOUNTER — Other Ambulatory Visit (INDEPENDENT_AMBULATORY_CARE_PROVIDER_SITE_OTHER): Payer: Self-pay | Admitting: Vascular Surgery

## 2017-05-30 ENCOUNTER — Ambulatory Visit (INDEPENDENT_AMBULATORY_CARE_PROVIDER_SITE_OTHER): Payer: Medicare Other

## 2017-05-30 ENCOUNTER — Encounter (INDEPENDENT_AMBULATORY_CARE_PROVIDER_SITE_OTHER): Payer: Self-pay | Admitting: Vascular Surgery

## 2017-05-30 VITALS — BP 122/63 | HR 57 | Resp 15 | Ht 70.0 in | Wt 162.0 lb

## 2017-05-30 DIAGNOSIS — N186 End stage renal disease: Secondary | ICD-10-CM

## 2017-05-30 DIAGNOSIS — E1165 Type 2 diabetes mellitus with hyperglycemia: Secondary | ICD-10-CM

## 2017-05-30 DIAGNOSIS — IMO0002 Reserved for concepts with insufficient information to code with codable children: Secondary | ICD-10-CM

## 2017-05-30 DIAGNOSIS — N185 Chronic kidney disease, stage 5: Secondary | ICD-10-CM

## 2017-05-30 DIAGNOSIS — E1159 Type 2 diabetes mellitus with other circulatory complications: Secondary | ICD-10-CM

## 2017-05-30 DIAGNOSIS — Z992 Dependence on renal dialysis: Secondary | ICD-10-CM

## 2017-05-30 DIAGNOSIS — I10 Essential (primary) hypertension: Secondary | ICD-10-CM | POA: Diagnosis not present

## 2017-06-02 ENCOUNTER — Encounter (INDEPENDENT_AMBULATORY_CARE_PROVIDER_SITE_OTHER): Payer: Self-pay

## 2017-06-03 ENCOUNTER — Other Ambulatory Visit (INDEPENDENT_AMBULATORY_CARE_PROVIDER_SITE_OTHER): Payer: Self-pay | Admitting: Vascular Surgery

## 2017-06-03 DIAGNOSIS — Z992 Dependence on renal dialysis: Secondary | ICD-10-CM | POA: Insufficient documentation

## 2017-06-03 DIAGNOSIS — N186 End stage renal disease: Secondary | ICD-10-CM | POA: Insufficient documentation

## 2017-06-03 NOTE — Progress Notes (Signed)
Subjective:    Patient ID: Martin Gilbert, male    DOB: 17-May-1941, 76 y.o.   MRN: [id number] Chief Complaint  Patient presents with  . New Evaluation    Vein mapping   Presents as a new patient for hemodialysis access evaluation. The patient is currently on dialysis. He is being maintained by a right IJ PermCath. Patient states that he is right hand dominant. Patient denies any uremic symptoms. Denies any fever, nausea or vomiting. Interpreter was used. The patient underwent bilateral upper extremity vein mapping which was notable for the creation of a left radiocephalic AV fistula.   Review of Systems  Constitutional: Negative.   HENT: Negative.   Eyes: Negative.   Respiratory: Negative.   Cardiovascular: Negative.   Gastrointestinal: Negative.   Endocrine: Negative.   Genitourinary: Negative.   Musculoskeletal: Negative.   Skin: Negative.   Allergic/Immunologic: Negative.   Neurological: Negative.   Hematological: Negative.   Psychiatric/Behavioral: Negative.       Objective:   Physical Exam  Constitutional: He is oriented to person, place, and time. He appears well-developed and well-nourished. No distress.  HENT:  Head: Normocephalic and atraumatic.  Eyes: Conjunctivae are normal. Pupils are equal, round, and reactive to light.  Neck: Normal range of motion.  Cardiovascular: Normal rate, regular rhythm, normal heart sounds and intact distal pulses.   Pulses:      Radial pulses are 2+ on the right side, and 2+ on the left side.  Pulmonary/Chest: Effort normal.  Musculoskeletal: Normal range of motion. He exhibits no edema.  Neurological: He is alert and oriented to person, place, and time.  Skin: Skin is warm and dry. He is not diaphoretic.  Psychiatric: He has a normal mood and affect. His behavior is normal. Judgment and thought content normal.  Vitals reviewed.   BP 122/63 (BP Location: Right Arm)   Pulse (!) 57   Resp 15   Ht 5\' 10"  (1.778 m)   Wt  162 lb (73.5 kg)   BMI 23.24 kg/m   Past Medical History:  Diagnosis Date  . Back pain   . BPH (benign prostatic hyperplasia)   . CKD (chronic kidney disease), stage IV ([place])   . Diabetes mellitus without complication ([place])   . HTN (hypertension)   . Hyperlipidemia     Social History   Social History  . Marital status: Married    Spouse name: N/A  . Number of children: N/A  . Years of education: N/A   Occupational History  . retired    Social History Main Topics  . Smoking status: Never Smoker  . Smokeless tobacco: Never Used  . Alcohol use No     Comment: Drank in the past, but has stopped for several years.   . Drug use: No  . Sexual activity: Not on file   Other Topics Concern  . Not on file   Social History Narrative  . No narrative on file    Past Surgical History:  Procedure Laterality Date  . DIALYSIS/PERMA CATHETER INSERTION N/A 04/03/2017   Procedure: Dialysis/Perma Catheter Insertion;  Surgeon: Algernon Huxley, MD;  Location: [place] [facility];  Service: Cardiovascular;  Laterality: N/A;  . hernia repair      Family History  Problem Relation Age of Onset  . Diabetes Neg Hx   . Hypertension Neg Hx     Allergies  Allergen Reactions  . Aripiprazole     Denial of this listed allergy.  Assessment & Plan:  Presents as a new patient for hemodialysis access evaluation. The patient is currently on dialysis. He is being maintained by a right IJ PermCath. Patient states that he is right hand dominant. Patient denies any uremic symptoms. Denies any fever, nausea or vomiting. Interpreter was used. The patient underwent bilateral upper extremity vein mapping which was notable for the creation of a left radiocephalic AV fistula.  1. ESRD on dialysis [facility]) - new Patient is currently on hemodialysis. He is currently being maintained by a right IJ PermCath without any issue. Interpreter was used to explain procedure, risks and benefits in regards to the  creation of a left radiocephalic AV fistula All questions were answered Patient wishes to proceed No more IVs, blood pressures or blood draws of the left upper extremity. Patient understands this will take 4-6 weeks to mature.  2. Uncontrolled type 2 diabetes mellitus with other circulatory complication, without long-term current use of insulin ([name]) - stable Encouraged good control as its slows the progression of atherosclerotic disease  3. Essential hypertension - stable Encouraged good control as its slows the progression of atherosclerotic disease  Current Outpatient Prescriptions on File Prior to Visit  Medication Sig Dispense Refill  . acetaminophen (TYLENOL) 500 MG tablet Take 500 mg by mouth every 8 (eight) hours as needed.    Marland Kitchen allopurinol (ZYLOPRIM) 100 MG tablet Take 100 mg by mouth daily.    Marland Kitchen amLODipine (NORVASC) 2.5 MG tablet Take 2.5 mg by mouth daily.    Marland Kitchen aspirin 81 MG tablet Take 81 mg by mouth daily.    Marland Kitchen atorvastatin (LIPITOR) 20 MG tablet Take 20 mg by mouth daily.    Marland Kitchen doxazosin (CARDURA) 2 MG tablet Take 2 mg by mouth at bedtime.     . feeding supplement, GLUCERNA SHAKE, (GLUCERNA SHAKE) LIQD Take 237 mLs by mouth 3 (three) times daily between meals. 90 Can 6  . ferrous sulfate 325 (65 FE) MG tablet Take 325 mg by mouth daily with breakfast.  11  . gabapentin (NEURONTIN) 100 MG capsule Take 1 capsule (100 mg total) by mouth every 8 (eight) hours as needed (tingling or numbness in feet). 90 capsule 0  . hydrALAZINE (APRESOLINE) 50 MG tablet Take 50 mg by mouth 3 (three) times daily.    . Melatonin 5 MG TABS Take 1 tablet (5 mg total) by mouth at bedtime. 30 tablet 0  . metoprolol succinate (TOPROL-XL) 25 MG 24 hr tablet Take 25 mg by mouth daily.    . Multiple Vitamin (MULTI-VITAMINS) TABS Take 1 tablet by mouth daily.    . ondansetron (ZOFRAN) 4 MG tablet Take 1 tablet (4 mg total) by mouth every 6 (six) hours as needed for nausea. 20 tablet 0  . pantoprazole  (PROTONIX) 40 MG tablet Take 1 tablet (40 mg total) by mouth daily. 30 tablet 6  . Polyvinyl Alcohol-Povidone (REFRESH OP) Place 1 drop into both eyes 2 (two) times daily.    . sodium bicarbonate 650 MG tablet Take 650 mg by mouth 2 (two) times daily.     . tamsulosin (FLOMAX) 0.4 MG CAPS capsule Take 1 capsule (0.4 mg total) by mouth daily after supper. 30 capsule 0  . vancomycin (VANCOCIN) 50 mg/mL oral solution Take 2.5 mLs (125 mg total) by mouth 4 (four) times daily. Until 04/14/17 100 mL 0  . vancomycin (VANCOCIN) 50 mg/mL oral solution Take 2.5 mLs (125 mg total) by mouth 2 (two) times daily. For 1 week starting 04/15/17  40 mL 0  . vancomycin (VANCOCIN) 50 mg/mL oral solution Take 2.5 mLs (125 mg total) by mouth daily. X 1 week starting 04/22/17 20 mL 0  . vancomycin (VANCOCIN) 50 mg/mL oral solution Take 2.5 mLs (125 mg total) by mouth every other day. Starting 04/30/17 10 mL 0  . vancomycin (VANCOCIN) 50 mg/mL oral solution Take 2.5 mLs (125 mg total) by mouth every 3 (three) days. X 1 week starting 05/08/17 10 mL 0  . Vitamin D, Cholecalciferol, 1000 UNITS TABS Take by mouth.     No current facility-administered medications on file prior to visit.     There are no Patient Instructions on file for this visit. No Follow-up on file.   KIMBERLY A STEGMAYER, PA-C

## 2017-06-04 ENCOUNTER — Encounter
Admission: RE | Admit: 2017-06-04 | Discharge: 2017-06-04 | Disposition: A | Discharge status: Home / Self Care | Payer: Medicare Other | Source: Ambulatory Visit | Attending: Vascular Surgery | Admitting: Vascular Surgery

## 2017-06-04 DIAGNOSIS — D649 Anemia, unspecified: Secondary | ICD-10-CM | POA: Diagnosis not present

## 2017-06-04 DIAGNOSIS — E872 Acidosis: Secondary | ICD-10-CM | POA: Diagnosis not present

## 2017-06-04 DIAGNOSIS — E1140 Type 2 diabetes mellitus with diabetic neuropathy, unspecified: Secondary | ICD-10-CM | POA: Diagnosis not present

## 2017-06-04 DIAGNOSIS — N186 End stage renal disease: Secondary | ICD-10-CM | POA: Insufficient documentation

## 2017-06-04 DIAGNOSIS — E1122 Type 2 diabetes mellitus with diabetic chronic kidney disease: Secondary | ICD-10-CM | POA: Insufficient documentation

## 2017-06-04 DIAGNOSIS — I120 Hypertensive chronic kidney disease with stage 5 chronic kidney disease or end stage renal disease: Secondary | ICD-10-CM | POA: Insufficient documentation

## 2017-06-04 DIAGNOSIS — D638 Anemia in other chronic diseases classified elsewhere: Secondary | ICD-10-CM | POA: Insufficient documentation

## 2017-06-04 DIAGNOSIS — A0472 Enterocolitis due to Clostridium difficile, not specified as recurrent: Secondary | ICD-10-CM | POA: Diagnosis not present

## 2017-06-04 DIAGNOSIS — Z01812 Encounter for preprocedural laboratory examination: Secondary | ICD-10-CM | POA: Insufficient documentation

## 2017-06-04 DIAGNOSIS — R531 Weakness: Secondary | ICD-10-CM | POA: Insufficient documentation

## 2017-06-04 DIAGNOSIS — A419 Sepsis, unspecified organism: Secondary | ICD-10-CM | POA: Insufficient documentation

## 2017-06-04 DIAGNOSIS — N179 Acute kidney failure, unspecified: Secondary | ICD-10-CM | POA: Insufficient documentation

## 2017-06-04 DIAGNOSIS — Z992 Dependence on renal dialysis: Secondary | ICD-10-CM | POA: Insufficient documentation

## 2017-06-04 HISTORY — DX: Gastro-esophageal reflux disease without esophagitis: K21.9

## 2017-06-04 HISTORY — DX: Anemia, unspecified: D64.9

## 2017-06-04 LAB — BASIC METABOLIC PANEL
ANION GAP: 10 (ref 5–15)
BUN: 36 mg/dL — ABNORMAL HIGH (ref 6–20)
CALCIUM: 8.8 mg/dL — AB (ref 8.9–10.3)
CO2: 25 mmol/L (ref 22–32)
Chloride: 98 mmol/L — ABNORMAL LOW (ref 101–111)
Creatinine, Ser: 6.06 mg/dL — ABNORMAL HIGH (ref 0.61–1.24)
GFR calc Af Amer: 9 mL/min — ABNORMAL LOW (ref >60)
GFR, EST NON AFRICAN AMERICAN: 8 mL/min — AB (ref >60)
Glucose, Bld: 166 mg/dL — ABNORMAL HIGH (ref 65–99)
Potassium: 3.7 mmol/L (ref 3.5–5.1)
Sodium: 133 mmol/L — ABNORMAL LOW (ref 135–145)

## 2017-06-04 LAB — TYPE AND SCREEN
ABO/RH(D): O POS
Antibody Screen: NEGATIVE

## 2017-06-04 LAB — CBC WITH DIFFERENTIAL/PLATELET
BASOS PCT: 1 %
Basophils Absolute: 0.1 10*3/uL (ref 0–0.1)
Eosinophils Absolute: 0.4 10*3/uL (ref 0–0.7)
Eosinophils Relative: 6 %
HEMATOCRIT: 35.6 % — AB (ref 40.0–52.0)
HEMOGLOBIN: 11.8 g/dL — AB (ref 13.0–18.0)
Lymphocytes Relative: 44 %
Lymphs Abs: 2.6 10*3/uL (ref 1.0–3.6)
MCH: 31.1 pg (ref 26.0–34.0)
MCHC: 33.1 g/dL (ref 32.0–36.0)
MCV: 94 fL (ref 80.0–100.0)
MONO ABS: 0.6 10*3/uL (ref 0.2–1.0)
Monocytes Relative: 11 %
NEUTROS ABS: 2.2 10*3/uL (ref 1.4–6.5)
NEUTROS PCT: 38 %
Platelets: 258 10*3/uL (ref 150–440)
RBC: 3.79 MIL/uL — ABNORMAL LOW (ref 4.40–5.90)
RDW: 15.3 % — AB (ref 11.5–14.5)
WBC: 5.8 10*3/uL (ref 3.8–10.6)

## 2017-06-04 LAB — PROTIME-INR
INR: 1.05
Prothrombin Time: 13.7 seconds (ref 11.4–15.2)

## 2017-06-04 LAB — SURGICAL PCR SCREEN
MRSA, PCR: POSITIVE — AB
Staphylococcus aureus: POSITIVE — AB

## 2017-06-04 LAB — APTT: aPTT: 38 seconds — ABNORMAL HIGH (ref 24–36)

## 2017-06-04 NOTE — Pre-Procedure Instructions (Signed)
Positive PCR screen faxed to Dr Nino Parsley office - positive MRSA and Staph

## 2017-06-04 NOTE — Patient Instructions (Signed)
Your procedure is scheduled on: June 13, 2017  Report to second floor of the medical mall come thru revolving door entrance  To find out your arrival time please call [phone] between 1PM - 3PM on Thursday June 12, 2017   Remember: Instructions that are not followed completely may result in serious medical risk, up to and including death, or upon the discretion of your surgeon and anesthesiologist your surgery may need to be rescheduled.     __x__ 1. Do not eat food or drink liquids after midnight. No gum chewing or hard candies.        _x___ 2. No alcohol for 24 hours before or after surgery.      _x___ 3. Bring all medications with you on the day of surgery if instructed. BRING ANY NEW MEDICATIONS      __X__ 4. Notify your doctor if there is any change in your medical condition (cold, fever,                             infections).      Do not wear jewelry, make-up, hairpins, clips or nail polish.    Do not wear lotions, powders, or perfumes. You may not wear deodorant.               Do not shave 48 hours prior to surgery. Men may shave face and neck.    Do not bring valuables to the hospital.    [facility] is not responsible for any belongings or valuables.                 Contacts, dentures or bridgework may not be worn into surgery.  Los lentes de [place], las dentaduras postizas o puentes no se pueden usar en la [place].  Leave your suitcase in the car. After surgery it may be brought to your room.  Deje su maleta en el auto.  Despus de la ciruga podr traerla a su habitacin.  For patients admitted to the hospital, discharge time is determined by your treatment team.  Para los pacientes que sean ingresados al hospital, el tiempo en el cual se le dar de alta es determinado por su                equipo de [place].   Patients discharged the day of surgery will not be allowed to drive home. A los pacientes que se les da de alta el mismo da de la ciruga no  se les permitir conducir a Holiday representative.   Please read over the following fact sheets that you were given: Por favor [place] informacin que le dieron:   CHG INF0   __X__ Take these medicines the morning of surgery with A SIP OF WATER:            1. AMLODIPINE  2. METOPROLOL  3. PANTOPRAZOLE TAKE ONE DOSE NIGHT BEFORE SURGERY AND A DOSE MORNING OF SURGERY  4  TAMSULOSIN     5. ALLOPURINOL  6.ATORVASTATIN  __X__ Use CHG Soap as directed               __X__ Stop Anti-inflammatories on June 06, 2017 ADVIL, MOTRIN, IBUPROFEN, ALEVE, GOODY'S POWDER USE TYLENOL ONLY

## 2017-06-05 ENCOUNTER — Other Ambulatory Visit (INDEPENDENT_AMBULATORY_CARE_PROVIDER_SITE_OTHER): Payer: Self-pay | Admitting: Vascular Surgery

## 2017-06-05 DIAGNOSIS — N186 End stage renal disease: Secondary | ICD-10-CM

## 2017-06-05 DIAGNOSIS — Z992 Dependence on renal dialysis: Principal | ICD-10-CM

## 2017-06-05 MED ORDER — VANCOMYCIN HCL 10 G IV SOLR: 1000.0000 mg | Freq: Once | INTRAVENOUS | Status: DC

## 2017-06-13 ENCOUNTER — Encounter
Admission: RE | Disposition: A | Discharge status: Home / Self Care | Payer: Self-pay | Source: Ambulatory Visit | Attending: Vascular Surgery

## 2017-06-13 ENCOUNTER — Ambulatory Visit
Admission: RE | Admit: 2017-06-13 | Discharge: 2017-06-13 | Disposition: A | Discharge status: Home / Self Care | Payer: Medicare Other | Source: Ambulatory Visit | Attending: Vascular Surgery | Admitting: Vascular Surgery

## 2017-06-13 ENCOUNTER — Ambulatory Visit: Payer: Medicare Other | Admitting: Anesthesiology

## 2017-06-13 ENCOUNTER — Encounter: Payer: Self-pay | Admitting: *Deleted

## 2017-06-13 DIAGNOSIS — E1122 Type 2 diabetes mellitus with diabetic chronic kidney disease: Secondary | ICD-10-CM | POA: Diagnosis not present

## 2017-06-13 DIAGNOSIS — Z7982 Long term (current) use of aspirin: Secondary | ICD-10-CM | POA: Insufficient documentation

## 2017-06-13 DIAGNOSIS — I120 Hypertensive chronic kidney disease with stage 5 chronic kidney disease or end stage renal disease: Secondary | ICD-10-CM | POA: Diagnosis present

## 2017-06-13 DIAGNOSIS — N186 End stage renal disease: Secondary | ICD-10-CM | POA: Insufficient documentation

## 2017-06-13 DIAGNOSIS — Z79899 Other long term (current) drug therapy: Secondary | ICD-10-CM | POA: Insufficient documentation

## 2017-06-13 DIAGNOSIS — K219 Gastro-esophageal reflux disease without esophagitis: Secondary | ICD-10-CM | POA: Insufficient documentation

## 2017-06-13 DIAGNOSIS — Z992 Dependence on renal dialysis: Secondary | ICD-10-CM | POA: Diagnosis not present

## 2017-06-13 HISTORY — PX: AV FISTULA PLACEMENT: SHX1204

## 2017-06-13 LAB — GLUCOSE, CAPILLARY
GLUCOSE-CAPILLARY: 78 mg/dL (ref 65–99)
GLUCOSE-CAPILLARY: 112 mg/dL — AB (ref 65–99)

## 2017-06-13 LAB — POCT I-STAT 4, (NA,K, GLUC, HGB,HCT)
GLUCOSE: 83 mg/dL (ref 65–99)
HEMATOCRIT: 36 % — AB (ref 39.0–52.0)
Hemoglobin: 12.2 g/dL — ABNORMAL LOW (ref 13.0–17.0)
Potassium: 3.8 mmol/L (ref 3.5–5.1)
SODIUM: 136 mmol/L (ref 135–145)

## 2017-06-13 LAB — ABO/RH: ABO/RH(D): O POS

## 2017-06-13 SURGERY — ARTERIOVENOUS (AV) FISTULA CREATION
Anesthesia: Regional | Site: Arm Lower | Laterality: Left | Wound class: Clean

## 2017-06-13 MED ORDER — OXYCODONE HCL 5 MG PO TABS: 5.0000 mg | ORAL_TABLET | Freq: Once | ORAL | Status: DC | PRN

## 2017-06-13 MED ORDER — MIDAZOLAM HCL 2 MG/2ML IJ SOLN
INTRAMUSCULAR | Status: AC
  Filled 2017-06-13: qty 2

## 2017-06-13 MED ORDER — PROPOFOL 500 MG/50ML IV EMUL
INTRAVENOUS | Status: AC
  Filled 2017-06-13: qty 50

## 2017-06-13 MED ORDER — OXYCODONE HCL 5 MG/5ML PO SOLN: 5.0000 mg | Freq: Once | ORAL | Status: DC | PRN

## 2017-06-13 MED ORDER — HYDROCODONE-ACETAMINOPHEN 5-325 MG PO TABS: 1.0000 | ORAL_TABLET | Freq: Four times a day (QID) | ORAL | 0 refills | Status: DC | PRN

## 2017-06-13 MED ORDER — EPHEDRINE SULFATE 50 MG/ML IJ SOLN
INTRAMUSCULAR | Status: DC | PRN
  Administered 2017-06-13: 10 mg via INTRAVENOUS

## 2017-06-13 MED ORDER — CHLORHEXIDINE GLUCONATE CLOTH 2 % EX PADS: 6.0000 | MEDICATED_PAD | Freq: Once | CUTANEOUS | Status: DC

## 2017-06-13 MED ORDER — MIDAZOLAM HCL 2 MG/2ML IJ SOLN
1.0000 mg | Freq: Once | INTRAMUSCULAR | Status: AC
  Administered 2017-06-13: 1 mg via INTRAVENOUS

## 2017-06-13 MED ORDER — SODIUM CHLORIDE 0.9 % IV SOLN
INTRAVENOUS | Status: DC | PRN
  Administered 2017-06-13: 08:00:00 via INTRAMUSCULAR

## 2017-06-13 MED ORDER — LIDOCAINE HCL (PF) 1 % IJ SOLN
INTRAMUSCULAR | Status: DC | PRN
  Administered 2017-06-13: 1 mL via INTRADERMAL

## 2017-06-13 MED ORDER — ROPIVACAINE HCL 5 MG/ML IJ SOLN
INTRAMUSCULAR | Status: DC | PRN
  Administered 2017-06-13 (×2): 10 mL via PERINEURAL

## 2017-06-13 MED ORDER — FENTANYL CITRATE (PF) 100 MCG/2ML IJ SOLN: 25.0000 ug | INTRAMUSCULAR | Status: DC | PRN

## 2017-06-13 MED ORDER — HEPARIN SODIUM (PORCINE) 10000 UNIT/ML IJ SOLN
INTRAMUSCULAR | Status: AC
  Filled 2017-06-13: qty 1

## 2017-06-13 MED ORDER — SODIUM CHLORIDE 0.9 % IV SOLN
INTRAVENOUS | Status: DC | PRN
  Administered 2017-06-13: 25 ug/min via INTRAVENOUS

## 2017-06-13 MED ORDER — BUPIVACAINE HCL (PF) 0.5 % IJ SOLN
INTRAMUSCULAR | Status: AC
  Filled 2017-06-13: qty 30

## 2017-06-13 MED ORDER — ROPIVACAINE HCL 5 MG/ML IJ SOLN
INTRAMUSCULAR | Status: AC
  Filled 2017-06-13: qty 30

## 2017-06-13 MED ORDER — PAPAVERINE HCL 30 MG/ML IJ SOLN
INTRAMUSCULAR | Status: AC
  Filled 2017-06-13: qty 2

## 2017-06-13 MED ORDER — PROPOFOL 10 MG/ML IV BOLUS
INTRAVENOUS | Status: AC
  Filled 2017-06-13: qty 20

## 2017-06-13 MED ORDER — EPHEDRINE SULFATE 50 MG/ML IJ SOLN
INTRAMUSCULAR | Status: AC
  Filled 2017-06-13: qty 1

## 2017-06-13 MED ORDER — LIDOCAINE HCL (PF) 2 % IJ SOLN
INTRAMUSCULAR | Status: AC
  Filled 2017-06-13: qty 10

## 2017-06-13 MED ORDER — CHLORHEXIDINE GLUCONATE CLOTH 2 % EX PADS
6.0000 | MEDICATED_PAD | Freq: Once | CUTANEOUS | Status: AC
Start: 2017-06-13 — End: 2017-06-13
  Administered 2017-06-13: 6 via TOPICAL

## 2017-06-13 MED ORDER — PROPOFOL 500 MG/50ML IV EMUL
INTRAVENOUS | Status: DC | PRN
  Administered 2017-06-13: 100 ug/kg/min via INTRAVENOUS

## 2017-06-13 MED ORDER — FENTANYL CITRATE (PF) 100 MCG/2ML IJ SOLN
INTRAMUSCULAR | Status: AC
Start: 2017-06-13 — End: 2017-06-13
  Administered 2017-06-13: 50 ug via INTRAVENOUS
  Filled 2017-06-13: qty 2

## 2017-06-13 MED ORDER — LIDOCAINE HCL (PF) 2 % IJ SOLN
INTRAMUSCULAR | Status: AC
  Filled 2017-06-13: qty 2

## 2017-06-13 MED ORDER — SODIUM CHLORIDE 0.9 % IV SOLN: INTRAVENOUS | Status: DC

## 2017-06-13 MED ORDER — LIDOCAINE HCL (CARDIAC) 20 MG/ML IV SOLN
INTRAVENOUS | Status: DC | PRN
  Administered 2017-06-13: 20 mg via INTRAVENOUS

## 2017-06-13 MED ORDER — VANCOMYCIN HCL IN DEXTROSE 1-5 GM/200ML-% IV SOLN
1000.0000 mg | Freq: Once | INTRAVENOUS | Status: AC
  Administered 2017-06-13: 1000 mg via INTRAVENOUS

## 2017-06-13 MED ORDER — FENTANYL CITRATE (PF) 100 MCG/2ML IJ SOLN
INTRAMUSCULAR | Status: AC
  Filled 2017-06-13: qty 2

## 2017-06-13 MED ORDER — FENTANYL CITRATE (PF) 100 MCG/2ML IJ SOLN
50.0000 ug | Freq: Once | INTRAMUSCULAR | Status: AC
  Administered 2017-06-13: 50 ug via INTRAVENOUS

## 2017-06-13 MED ORDER — ONDANSETRON HCL 4 MG/2ML IJ SOLN
INTRAMUSCULAR | Status: DC | PRN
  Administered 2017-06-13: 4 mg via INTRAVENOUS

## 2017-06-13 MED ORDER — LIDOCAINE HCL (PF) 1 % IJ SOLN
INTRAMUSCULAR | Status: AC
  Filled 2017-06-13: qty 5

## 2017-06-13 MED ORDER — DEXTROSE-NACL 5-0.9 % IV SOLN
INTRAVENOUS | Status: DC
  Administered 2017-06-13 (×2): via INTRAVENOUS

## 2017-06-13 MED ORDER — MIDAZOLAM HCL 2 MG/2ML IJ SOLN
INTRAMUSCULAR | Status: AC
  Administered 2017-06-13: 1 mg via INTRAVENOUS
  Filled 2017-06-13: qty 2

## 2017-06-13 MED ORDER — LIDOCAINE HCL (PF) 2 % IJ SOLN
INTRAMUSCULAR | Status: DC | PRN
  Administered 2017-06-13 (×2): 5 mL via PERINEURAL

## 2017-06-13 MED ORDER — PHENYLEPHRINE HCL 10 MG/ML IJ SOLN
INTRAMUSCULAR | Status: AC
  Filled 2017-06-13: qty 1

## 2017-06-13 MED ORDER — ONDANSETRON HCL 4 MG/2ML IJ SOLN
INTRAMUSCULAR | Status: AC
  Filled 2017-06-13: qty 2

## 2017-06-13 MED ORDER — VANCOMYCIN HCL IN DEXTROSE 1-5 GM/200ML-% IV SOLN
INTRAVENOUS | Status: AC
  Administered 2017-06-13: 1000 mg via INTRAVENOUS
  Filled 2017-06-13: qty 200

## 2017-06-13 SURGICAL SUPPLY — 55 items
APPLIER CLIP 11 MED OPEN (CLIP)
APPLIER CLIP 9.375 SM OPEN (CLIP)
BAG DECANTER FOR FLEXI CONT (MISCELLANEOUS) ×3 IMPLANT
BLADE SURG SZ11 CARB STEEL (BLADE) ×3 IMPLANT
BOOT SUTURE AID YELLOW STND (SUTURE) ×3 IMPLANT
BRUSH SCRUB 4% CHG (MISCELLANEOUS) ×3 IMPLANT
CANISTER SUCT 1200ML W/VALVE (MISCELLANEOUS) ×3 IMPLANT
CHLORAPREP W/TINT 26ML (MISCELLANEOUS) ×3 IMPLANT
CLIP APPLIE 11 MED OPEN (CLIP) IMPLANT
CLIP APPLIE 9.375 SM OPEN (CLIP) IMPLANT
DERMABOND ADVANCED (GAUZE/BANDAGES/DRESSINGS) ×2
DERMABOND ADVANCED .7 DNX12 (GAUZE/BANDAGES/DRESSINGS) ×1 IMPLANT
DRESSING SURGICEL FIBRLLR 1X2 (HEMOSTASIS) ×1 IMPLANT
DRSG SURGICEL FIBRILLAR 1X2 (HEMOSTASIS) ×3
ELECT CAUTERY BLADE 6.4 (BLADE) ×3 IMPLANT
ELECT REM PT RETURN 9FT ADLT (ELECTROSURGICAL) ×3
ELECTRODE REM PT RTRN 9FT ADLT (ELECTROSURGICAL) ×1 IMPLANT
GEL ULTRASOUND 20GR AQUASONIC (MISCELLANEOUS) IMPLANT
GLOVE BIO SURGEON STRL SZ7 (GLOVE) ×3 IMPLANT
GLOVE INDICATOR 7.5 STRL GRN (GLOVE) ×3 IMPLANT
GLOVE SURG SYN 8.0 (GLOVE) ×3 IMPLANT
GOWN STRL REUS W/ TWL LRG LVL3 (GOWN DISPOSABLE) ×2 IMPLANT
GOWN STRL REUS W/ TWL XL LVL3 (GOWN DISPOSABLE) ×1 IMPLANT
GOWN STRL REUS W/TWL LRG LVL3 (GOWN DISPOSABLE) ×4
GOWN STRL REUS W/TWL XL LVL3 (GOWN DISPOSABLE) ×2
IV NS 500ML (IV SOLUTION) ×2
IV NS 500ML BAXH (IV SOLUTION) ×1 IMPLANT
KIT RM TURNOVER STRD PROC AR (KITS) ×3 IMPLANT
LABEL OR SOLS (LABEL) ×3 IMPLANT
LOOP RED MAXI  1X406MM (MISCELLANEOUS) ×2
LOOP VESSEL MAXI 1X406 RED (MISCELLANEOUS) ×1 IMPLANT
LOOP VESSEL MINI 0.8X406 BLUE (MISCELLANEOUS) ×2 IMPLANT
LOOPS BLUE MINI 0.8X406MM (MISCELLANEOUS) ×4
NEEDLE FILTER BLUNT 18X 1/2SAF (NEEDLE) ×2
NEEDLE FILTER BLUNT 18X1 1/2 (NEEDLE) ×1 IMPLANT
NEEDLE HYPO 30X.5 LL (NEEDLE) IMPLANT
NS IRRIG 500ML POUR BTL (IV SOLUTION) ×3 IMPLANT
PACK EXTREMITY ARMC (MISCELLANEOUS) ×3 IMPLANT
PAD PREP 24X41 OB/GYN DISP (PERSONAL CARE ITEMS) ×3 IMPLANT
PUNCH SURGICAL ROTATE 2.7MM (MISCELLANEOUS) IMPLANT
STOCKINETTE STRL 4IN 9604848 (GAUZE/BANDAGES/DRESSINGS) ×3 IMPLANT
SUT MNCRL+ 5-0 UNDYED PC-3 (SUTURE) ×1 IMPLANT
SUT MONOCRYL 5-0 (SUTURE) ×2
SUT PROLENE 6 0 BV (SUTURE) ×12 IMPLANT
SUT SILK 2 0 (SUTURE) ×2
SUT SILK 2-0 18XBRD TIE 12 (SUTURE) ×1 IMPLANT
SUT SILK 3 0 (SUTURE) ×2
SUT SILK 3-0 18XBRD TIE 12 (SUTURE) ×1 IMPLANT
SUT SILK 4 0 (SUTURE) ×2
SUT SILK 4-0 18XBRD TIE 12 (SUTURE) ×1 IMPLANT
SUT VIC AB 3-0 SH 27 (SUTURE) ×2
SUT VIC AB 3-0 SH 27X BRD (SUTURE) ×1 IMPLANT
SYR 20CC LL (SYRINGE) ×3 IMPLANT
SYR 3ML LL SCALE MARK (SYRINGE) ×3 IMPLANT
TOWEL OR 17X26 4PK STRL BLUE (TOWEL DISPOSABLE) IMPLANT

## 2017-06-13 NOTE — Op Note (Signed)
     OPERATIVE NOTE   PROCEDURE: left radiocephalic arteriovenous fistula placement  PRE-OPERATIVE DIAGNOSIS: End Stage Renal Disease  POST-OPERATIVE DIAGNOSIS: End Stage Renal Disease  SURGEON: Hortencia Pilar  ASSISTANT(S): Ms. Hezzie Bump  ANESTHESIA: regional  ESTIMATED BLOOD LOSS: <50 cc  FINDING(S): 3.0 mm vein  SPECIMEN(S):  none  INDICATIONS:   Martin Gilbert is a 76 y.o. male who presents with end stage renal disease.  The patient is scheduled for left brachiocephalic arteriovenous fistula placement.  The patient is aware the risks include but are not limited to: bleeding, infection, steal syndrome, nerve damage, ischemic monomelic neuropathy, failure to mature, and need for additional procedures.  The patient is aware of the risks of the procedure and elects to proceed forward.  DESCRIPTION: After full informed written consent was obtained from the patient, the patient was brought back to the operating room and placed supine upon the operating table.  Prior to induction, the patient received IV antibiotics.   After obtaining adequate anesthesia, the patient was then prepped and draped in the standard fashion for a left arm access procedure.    A linear incision was then created midway between the radial impulse and the cephalic vein. The cephalic vein was then identified and dissected circumferentially. It was marked with a surgical marker.    Attention was then turned to the radial artery which was exposed through the same incision and looped proximally and distally. Side branches were controlled with 4-0 silk ties.  The distal segment of the vein was ligated with a  2-0 silk, and the vein was transected.  The proximal segment was interrogated with serial dilators.  The vein accepted up to a 3.0 mm dilator without any difficulty. Heparinized saline was infused into the vein and clamped it with a small bulldog.  At this point, I reset my exposure of the  brachial artery and controlled the artery with vessel loops proximally and distally.  An arteriotomy was then made with a #11 blade, and extended with a Potts scissor.  Heparinized saline was injected proximal and distal into the radial artery.  The vein was then approximated to the artery while the artery was in its native bed and subsequently the vein was beveled using Potts scissors. The vein was then sewn to the artery in an end-to-side configuration with a running stitch of 6-0 Prolene.  Prior to completing this anastomosis Flushing maneuvers were performed and the artery was allowed to forward and back bleed.  There was no evidence of clot from any vessels.  I completed the anastomosis in the usual fashion and then released all vessel loops and clamps.    There was good  thrill in the venous outflow, and there was 1+ palpable radial pulse.  At this point, I irrigated out the surgical wound.  There was no further active bleeding.  The subcutaneous tissue was reapproximated with a running stitch of 3-0 Vicryl.  The skin was then reapproximated with a running subcuticular stitch of 4-0 Vicryl.  The skin was then cleaned, dried, and reinforced with Dermabond.    The patient tolerated this procedure well.   COMPLICATIONS: None  CONDITION: Margaretmary Dys [facility]  Office: [phone]   06/13/2017, 9:36 AM

## 2017-06-13 NOTE — Discharge Instructions (Addendum)
AMBULATORY SURGERY  DISCHARGE INSTRUCTIONS   1) The drugs that you were given will stay in your system until tomorrow so for the next 24 hours you should not:  A) Drive an automobile B) Make any legal decisions C) Drink any alcoholic beverage   2) You may resume regular meals tomorrow.  Today it is better to start with liquids and gradually work up to solid foods.  You may eat anything you prefer, but it is better to start with liquids, then soup and crackers, and gradually work up to solid foods.   3) Please notify your doctor immediately if you have any unusual bleeding, trouble breathing, redness and pain at the surgery site, drainage, fever, or pain not relieved by medication.  4) Additional Instructions:  Please contact your physician with any problems or Same Day Surgery at [phone], Monday through Friday 6 am to 4 pm, or [place] at [facility] number at [phone].     CIRUGIA AMBULATORIA       Instruccionnes de alta   1.  Las drogas que se Statistician en su cuerpo [employer], asi      que por las proximas 24 horas usted no debe:   Conducir Scientist, research (medical)) un automovil   Hacer ninguna decision legal   Tomar ninguna bebida alcoholica  2.  A) [place] puede comenzar una dieta regular.  Es mejor que hoy empiece con                    liquidos y gradualmente anada comidas solidas.       B) Puede comer cualquier comida que desee pero es mejor empezar con liquidos,               luego sopitas con galletas saladas y gradualmente llegar a las comidas solidas.  3.  Por favor avise a su medico inmediatamente si usted tiene algun sangrado anormal,       tiene dificultad con la respiracion, enrojecimiento y Social research officer, government en el sitio de la cirugia,     [place], fiebro o dolor que se alivia con [place].         4.  Istrucciones especificas :

## 2017-06-13 NOTE — Anesthesia Postprocedure Evaluation (Signed)
Anesthesia Post Note  Patient: Martin Gilbert  Procedure(s) Performed: Procedure(s) (LRB): ARTERIOVENOUS (AV) FISTULA CREATION ( RADIAL CEPHALIC ) (Left)  Patient location during evaluation: PACU Anesthesia Type: Regional Level of consciousness: awake and alert Pain management: pain level controlled Vital Signs Assessment: post-procedure vital signs reviewed and stable Respiratory status: spontaneous breathing, nonlabored ventilation, respiratory function stable and patient connected to nasal cannula oxygen Cardiovascular status: blood pressure returned to baseline and stable Postop Assessment: no signs of nausea or vomiting Anesthetic complications: no     Last Vitals:  Vitals:   06/13/17 1020 06/13/17 1026  BP:    Pulse:  (!) 54  Resp:  13  Temp: 36.4 C     Last Pain:  Vitals:   06/13/17 1026  TempSrc:   PainSc: 0-No pain                 Precious Haws Freddy Kinne

## 2017-06-13 NOTE — Transfer of Care (Signed)
Immediate Anesthesia Transfer of Care Note  Patient: Martin Gilbert  Procedure(s) Performed: Procedure(s): ARTERIOVENOUS (AV) FISTULA CREATION ( RADIAL CEPHALIC ) (Left)  Patient Location: PACU  Anesthesia Type:General and Regional  Level of Consciousness: awake, oriented and patient cooperative  Airway & Oxygen Therapy: Patient Spontanous Breathing and Patient connected to face mask oxygen  Post-op Assessment: Report given to RN and Post -op Vital signs reviewed and stable  Post vital signs: Reviewed and stable  Last Vitals:  Vitals:   06/13/17 0730 06/13/17 0735  BP: (!) 114/57 104/71  Pulse: (!) 59 (!) 59  Resp: 15 13  Temp:      Last Pain:  Vitals:   06/13/17 0620  TempSrc: Tympanic         Complications: No apparent anesthesia complications

## 2017-06-13 NOTE — Anesthesia Procedure Notes (Addendum)
Anesthesia Regional Block: Supraclavicular block   Pre-Anesthetic Checklist: ,, timeout performed, Correct Patient, Correct Site, Correct Laterality, Correct Procedure, Correct Position, site marked, Risks and benefits discussed,  Surgical consent,  Pre-op evaluation,  At surgeon's request and post-op pain management  Laterality: Upper and Left  Prep: chloraprep       Needles:  Injection technique: Single-shot  Needle Type: Echogenic Needle     Needle Length: 9cm  Needle Gauge: 21     Additional Needles:   Procedures: ultrasound guided,,,,,,,,  Narrative:  Start time: 06/13/2017 7:21 AM End time: 06/13/2017 7:24 AM Injection made incrementally with aspirations every 5 mL.  Performed by: Personally  Anesthesiologist: Katy Fitch K  Additional Notes: Functioning IV was confirmed and monitors were applied.  A echogenic needle was used. Sterile prep,hand hygiene and sterile gloves were used. Minimal sedation used for procedure.   No paresthesia endorsed by patient during the procedure.  Negative aspiration and negative test dose prior to incremental administration of local anesthetic. The patient tolerated the procedure well with no immediate complications.

## 2017-06-13 NOTE — H&P (Signed)
[facility] History & Physical Update  The patient was interviewed and re-examined.  The patient's previous History and Physical has been reviewed and is unchanged.  There is no change in the plan of care. We plan to proceed with the scheduled procedure.  Hortencia Pilar, MD  06/13/2017, 7:19 AM

## 2017-06-13 NOTE — Anesthesia Post-op Follow-up Note (Cosign Needed)
Anesthesia QCDR form completed.        

## 2017-06-13 NOTE — Anesthesia Preprocedure Evaluation (Signed)
Anesthesia Evaluation  Patient identified by MRN, date of birth, ID band Patient awake    Reviewed: Allergy & Precautions, H&P , NPO status , Patient's Chart, lab work & pertinent test results  History of Anesthesia Complications Negative for: history of anesthetic complications  Airway Mallampati: III  TM Distance: <3 FB Neck ROM: full    Dental  (+) Poor Dentition, Missing   Pulmonary neg pulmonary ROS, neg shortness of breath,           Cardiovascular hypertension, (-) angina(-) Past MI and (-) DOE      Neuro/Psych  Headaches, negative psych ROS   GI/Hepatic Neg liver ROS, GERD  ,  Endo/Other  diabetes, Type 2  Renal/GU DialysisRenal disease  negative genitourinary   Musculoskeletal   Abdominal   Peds  Hematology negative hematology ROS (+)   Anesthesia Other Findings Past Medical History: No date: Anemia No date: Back pain No date: BPH (benign prostatic hyperplasia) No date: CKD (chronic kidney disease), stage IV ([name]) No date: Diabetes mellitus without complication ([name]) No date: GERD (gastroesophageal reflux disease) No date: HTN (hypertension) No date: Hyperlipidemia  Past Surgical History: No date: CATARACT EXTRACTION     Comment: one eye not sure which 04/03/2017: DIALYSIS/PERMA CATHETER INSERTION N/A     Comment: Procedure: Dialysis/Perma Catheter Insertion;               Surgeon: Algernon Huxley, MD;  Location: [place] [facility];  Service: Cardiovascular;                Laterality: N/A; No date: hernia repair     Reproductive/Obstetrics negative OB ROS                             Anesthesia Physical Anesthesia Plan  ASA: III  Anesthesia Plan: Regional   Post-op Pain Management:    Induction: Intravenous  PONV Risk Score and Plan: 1 and Ondansetron and Dexamethasone  Airway Management Planned: Natural Airway and Nasal Cannula  Additional  Equipment:   Intra-op Plan:   Post-operative Plan:   Informed Consent: I have reviewed the patients History and Physical, chart, labs and discussed the procedure including the risks, benefits and alternatives for the proposed anesthesia with the patient or authorized representative who has indicated his/her understanding and acceptance.   Dental Advisory Given  Plan Discussed with: Anesthesiologist, CRNA and Surgeon  Anesthesia Plan Comments: (Patient consented for risks of anesthesia including but not limited to:  - adverse reactions to medications - damage to teeth, lips or other oral mucosa - sore throat or hoarseness - Damage to heart, brain, lungs or loss of life  Patient voiced understanding.)        Anesthesia Quick Evaluation

## 2017-07-03 ENCOUNTER — Encounter (INDEPENDENT_AMBULATORY_CARE_PROVIDER_SITE_OTHER): Payer: Self-pay | Admitting: Vascular Surgery

## 2017-07-03 ENCOUNTER — Ambulatory Visit (INDEPENDENT_AMBULATORY_CARE_PROVIDER_SITE_OTHER): Payer: Medicare Other | Admitting: Vascular Surgery

## 2017-07-03 VITALS — BP 107/49 | HR 67 | Resp 16 | Wt 163.0 lb

## 2017-07-03 DIAGNOSIS — N184 Chronic kidney disease, stage 4 (severe): Secondary | ICD-10-CM

## 2017-07-06 NOTE — Progress Notes (Signed)
Patient ID: Martin Gilbert, male   DOB: [birth year], 76 y.o.   MRN: [id number]  Chief Complaint  Patient presents with  . Routine Post Op    HPI Martin Gilbert is a 76 y.o. male.   No pain states he can feel the thrill   Past Medical History:  Diagnosis Date  . Anemia   . Back pain   . BPH (benign prostatic hyperplasia)   . CKD (chronic kidney disease), stage IV ([place])   . Diabetes mellitus without complication ([place])   . GERD (gastroesophageal reflux disease)   . HTN (hypertension)   . Hyperlipidemia     Past Surgical History:  Procedure Laterality Date  . AV FISTULA PLACEMENT Left 06/13/2017   Procedure: ARTERIOVENOUS (AV) FISTULA CREATION ( RADIAL CEPHALIC );  Surgeon: Katha Cabal, MD;  Location: [facility] ORS;  Service: Vascular;  Laterality: Left;  . CATARACT EXTRACTION     one eye not sure which  . DIALYSIS/PERMA CATHETER INSERTION N/A 04/03/2017   Procedure: Dialysis/Perma Catheter Insertion;  Surgeon: Algernon Huxley, MD;  Location: [place];  Service: Cardiovascular;  Laterality: N/A;  . hernia repair        Allergies  Allergen Reactions  . Aripiprazole     Denial of this listed allergy.    Current Outpatient Prescriptions  Medication Sig Dispense Refill  . acetaminophen (TYLENOL) 500 MG tablet Take 500 mg by mouth every 8 (eight) hours as needed.    Marland Kitchen allopurinol (ZYLOPRIM) 100 MG tablet Take 100 mg by mouth daily.    Marland Kitchen amLODipine (NORVASC) 2.5 MG tablet Take 2.5 mg by mouth daily.    Marland Kitchen aspirin 81 MG tablet Take 81 mg by mouth daily.    Marland Kitchen atorvastatin (LIPITOR) 20 MG tablet Take 20 mg by mouth daily.    . feeding supplement, GLUCERNA SHAKE, (GLUCERNA SHAKE) LIQD Take 237 mLs by mouth 3 (three) times daily between meals. 90 Can 6  . ferrous sulfate 325 (65 FE) MG tablet Take 325 mg by mouth daily with breakfast.  11  . gabapentin (NEURONTIN) 100 MG capsule Take 1 capsule (100 mg total) by mouth every 8 (eight) hours as needed  (tingling or numbness in feet). 90 capsule 0  . glimepiride (AMARYL) 2 MG tablet Take 2 mg by mouth 2 (two) times daily.     . hydrALAZINE (APRESOLINE) 50 MG tablet Take 50 mg by mouth 3 (three) times daily.    Marland Kitchen HYDROcodone-acetaminophen (NORCO) 5-325 MG tablet Take 1-2 tablets by mouth every 6 (six) hours as needed for moderate pain or severe pain. 50 tablet 0  . Melatonin 5 MG TABS Take 1 tablet (5 mg total) by mouth at bedtime. 30 tablet 0  . metoprolol succinate (TOPROL-XL) 25 MG 24 hr tablet Take 25 mg by mouth daily.    . ondansetron (ZOFRAN) 4 MG tablet Take 1 tablet (4 mg total) by mouth every 6 (six) hours as needed for nausea. 20 tablet 0  . pantoprazole (PROTONIX) 40 MG tablet Take 1 tablet (40 mg total) by mouth daily. 30 tablet 6  . Polyvinyl Alcohol-Povidone (REFRESH OP) Place 1 drop into both eyes 2 (two) times daily.    . sodium bicarbonate 650 MG tablet Take 650 mg by mouth 2 (two) times daily.     . tamsulosin (FLOMAX) 0.4 MG CAPS capsule Take 1 capsule (0.4 mg total) by mouth daily after supper. 30 capsule 0  . vancomycin (VANCOCIN) 50 mg/mL oral solution Take 2.5  mLs (125 mg total) by mouth 4 (four) times daily. Until 04/14/17 100 mL 0  . vancomycin (VANCOCIN) 50 mg/mL oral solution Take 2.5 mLs (125 mg total) by mouth 2 (two) times daily. For 1 week starting 04/15/17 40 mL 0  . vancomycin (VANCOCIN) 50 mg/mL oral solution Take 2.5 mLs (125 mg total) by mouth daily. X 1 week starting 04/22/17 20 mL 0  . vancomycin (VANCOCIN) 50 mg/mL oral solution Take 2.5 mLs (125 mg total) by mouth every other day. Starting 04/30/17 10 mL 0  . vancomycin (VANCOCIN) 50 mg/mL oral solution Take 2.5 mLs (125 mg total) by mouth every 3 (three) days. X 1 week starting 05/08/17 10 mL 0  . Vitamin D, Cholecalciferol, 1000 UNITS TABS Take by mouth.     Current Facility-Administered Medications  Medication Dose Route Frequency Provider Last Rate Last Dose  . vancomycin (VANCOCIN) 1,000 mg in sodium  chloride 0.9 % 500 mL IVPB  1,000 mg Intravenous Once Leonides Schanz            Physical Exam BP (!) 107/49   Pulse 67   Resp 16   Wt 163 lb (73.9 kg)   BMI 27.12 kg/m  Gen:  WD/WN, NAD Skin: incision C/D/I Left radialcephalic fistula good thrill good bruit     Assessment/Plan:       Hortencia Pilar 07/06/2017, 3:39 PM   This note was created with Dragon medical transcription system.  Any errors from dictation are unintentional.

## 2017-08-02 ENCOUNTER — Emergency Department: Payer: Medicare Other

## 2017-08-02 ENCOUNTER — Inpatient Hospital Stay
Admission: EM | Admit: 2017-08-02 | Discharge: 2017-08-06 | DRG: 314 | Disposition: A | Discharge status: Home / Self Care | Payer: Medicare Other | Attending: Internal Medicine | Admitting: Internal Medicine

## 2017-08-02 DIAGNOSIS — E1122 Type 2 diabetes mellitus with diabetic chronic kidney disease: Secondary | ICD-10-CM | POA: Diagnosis present

## 2017-08-02 DIAGNOSIS — Z8249 Family history of ischemic heart disease and other diseases of the circulatory system: Secondary | ICD-10-CM

## 2017-08-02 DIAGNOSIS — Z7984 Long term (current) use of oral hypoglycemic drugs: Secondary | ICD-10-CM

## 2017-08-02 DIAGNOSIS — K7460 Unspecified cirrhosis of liver: Secondary | ICD-10-CM | POA: Diagnosis present

## 2017-08-02 DIAGNOSIS — Y838 Other surgical procedures as the cause of abnormal reaction of the patient, or of later complication, without mention of misadventure at the time of the procedure: Secondary | ICD-10-CM | POA: Diagnosis present

## 2017-08-02 DIAGNOSIS — E872 Acidosis: Secondary | ICD-10-CM | POA: Diagnosis present

## 2017-08-02 DIAGNOSIS — N400 Enlarged prostate without lower urinary tract symptoms: Secondary | ICD-10-CM | POA: Diagnosis present

## 2017-08-02 DIAGNOSIS — E876 Hypokalemia: Secondary | ICD-10-CM | POA: Diagnosis present

## 2017-08-02 DIAGNOSIS — M109 Gout, unspecified: Secondary | ICD-10-CM | POA: Diagnosis present

## 2017-08-02 DIAGNOSIS — T827XXA Infection and inflammatory reaction due to other cardiac and vascular devices, implants and grafts, initial encounter: Secondary | ICD-10-CM | POA: Diagnosis present

## 2017-08-02 DIAGNOSIS — Z79899 Other long term (current) drug therapy: Secondary | ICD-10-CM | POA: Diagnosis not present

## 2017-08-02 DIAGNOSIS — E785 Hyperlipidemia, unspecified: Secondary | ICD-10-CM | POA: Diagnosis present

## 2017-08-02 DIAGNOSIS — Z7982 Long term (current) use of aspirin: Secondary | ICD-10-CM | POA: Diagnosis not present

## 2017-08-02 DIAGNOSIS — K219 Gastro-esophageal reflux disease without esophagitis: Secondary | ICD-10-CM | POA: Diagnosis present

## 2017-08-02 DIAGNOSIS — Z888 Allergy status to other drugs, medicaments and biological substances status: Secondary | ICD-10-CM | POA: Diagnosis not present

## 2017-08-02 DIAGNOSIS — N186 End stage renal disease: Secondary | ICD-10-CM | POA: Diagnosis present

## 2017-08-02 DIAGNOSIS — E1142 Type 2 diabetes mellitus with diabetic polyneuropathy: Secondary | ICD-10-CM | POA: Diagnosis present

## 2017-08-02 DIAGNOSIS — N2581 Secondary hyperparathyroidism of renal origin: Secondary | ICD-10-CM | POA: Diagnosis present

## 2017-08-02 DIAGNOSIS — Z8619 Personal history of other infectious and parasitic diseases: Secondary | ICD-10-CM

## 2017-08-02 DIAGNOSIS — I120 Hypertensive chronic kidney disease with stage 5 chronic kidney disease or end stage renal disease: Secondary | ICD-10-CM | POA: Diagnosis present

## 2017-08-02 DIAGNOSIS — Z992 Dependence on renal dialysis: Secondary | ICD-10-CM | POA: Diagnosis not present

## 2017-08-02 DIAGNOSIS — D631 Anemia in chronic kidney disease: Secondary | ICD-10-CM | POA: Diagnosis present

## 2017-08-02 DIAGNOSIS — A419 Sepsis, unspecified organism: Secondary | ICD-10-CM | POA: Diagnosis present

## 2017-08-02 HISTORY — DX: Enterocolitis due to Clostridium difficile, not specified as recurrent: A04.72

## 2017-08-02 LAB — CBC WITH DIFFERENTIAL/PLATELET
BASOS PCT: 0 %
Basophils Absolute: 0.1 10*3/uL (ref 0–0.1)
EOS ABS: 0.1 10*3/uL (ref 0–0.7)
EOS PCT: 0 %
HCT: 36.2 % — ABNORMAL LOW (ref 40.0–52.0)
HEMOGLOBIN: 12.2 g/dL — AB (ref 13.0–18.0)
Lymphocytes Relative: 3 %
Lymphs Abs: 0.7 10*3/uL — ABNORMAL LOW (ref 1.0–3.6)
MCH: 30.4 pg (ref 26.0–34.0)
MCHC: 33.7 g/dL (ref 32.0–36.0)
MCV: 90.3 fL (ref 80.0–100.0)
MONOS PCT: 3 %
Monocytes Absolute: 0.7 10*3/uL (ref 0.2–1.0)
NEUTROS PCT: 94 %
Neutro Abs: 23.6 10*3/uL — ABNORMAL HIGH (ref 1.4–6.5)
PLATELETS: 205 10*3/uL (ref 150–440)
RBC: 4.01 MIL/uL — ABNORMAL LOW (ref 4.40–5.90)
RDW: 14.9 % — AB (ref 11.5–14.5)
WBC: 25.1 10*3/uL — ABNORMAL HIGH (ref 3.8–10.6)

## 2017-08-02 LAB — GLUCOSE, CAPILLARY
Glucose-Capillary: 172 mg/dL — ABNORMAL HIGH (ref 65–99)
Glucose-Capillary: 156 mg/dL — ABNORMAL HIGH (ref 65–99)

## 2017-08-02 LAB — MRSA PCR SCREENING: MRSA by PCR: POSITIVE — AB

## 2017-08-02 LAB — COMPREHENSIVE METABOLIC PANEL
ALBUMIN: 3.3 g/dL — AB (ref 3.5–5.0)
ALK PHOS: 406 U/L — AB (ref 38–126)
ALT: 39 U/L (ref 17–63)
AST: 54 U/L — AB (ref 15–41)
Anion gap: 12 (ref 5–15)
BUN: 23 mg/dL — AB (ref 6–20)
CALCIUM: 8.4 mg/dL — AB (ref 8.9–10.3)
CO2: 25 mmol/L (ref 22–32)
CREATININE: 3.45 mg/dL — AB (ref 0.61–1.24)
Chloride: 99 mmol/L — ABNORMAL LOW (ref 101–111)
GFR calc non Af Amer: 16 mL/min — ABNORMAL LOW (ref >60)
GFR, EST AFRICAN AMERICAN: 18 mL/min — AB (ref >60)
Glucose, Bld: 162 mg/dL — ABNORMAL HIGH (ref 65–99)
Potassium: 2.9 mmol/L — ABNORMAL LOW (ref 3.5–5.1)
SODIUM: 136 mmol/L (ref 135–145)
Total Bilirubin: 1.4 mg/dL — ABNORMAL HIGH (ref 0.3–1.2)
Total Protein: 7.5 g/dL (ref 6.5–8.1)

## 2017-08-02 LAB — TROPONIN I: TROPONIN I: 0.09 ng/mL — AB (ref <0.03)

## 2017-08-02 LAB — LACTIC ACID, PLASMA
Lactic Acid, Venous: 2 mmol/L (ref 0.5–1.9)
Lactic Acid, Venous: 1.7 mmol/L (ref 0.5–1.9)

## 2017-08-02 MED ORDER — ONDANSETRON HCL 4 MG/2ML IJ SOLN: 4.0000 mg | Freq: Four times a day (QID) | INTRAMUSCULAR | Status: DC | PRN

## 2017-08-02 MED ORDER — VANCOMYCIN HCL IN DEXTROSE 1-5 GM/200ML-% IV SOLN: 1000.0000 mg | INTRAVENOUS | Status: DC

## 2017-08-02 MED ORDER — ONDANSETRON HCL 4 MG PO TABS: 4.0000 mg | ORAL_TABLET | Freq: Four times a day (QID) | ORAL | Status: DC | PRN

## 2017-08-02 MED ORDER — PIPERACILLIN-TAZOBACTAM 3.375 G IVPB 30 MIN
3.3750 g | Freq: Once | INTRAVENOUS | Status: AC
  Administered 2017-08-02: 3.375 g via INTRAVENOUS

## 2017-08-02 MED ORDER — SODIUM CHLORIDE 0.9 % IV SOLN
1000.0000 mL | Freq: Once | INTRAVENOUS | Status: AC
  Administered 2017-08-02: 1000 mL via INTRAVENOUS

## 2017-08-02 MED ORDER — ALLOPURINOL 100 MG PO TABS
100.0000 mg | ORAL_TABLET | Freq: Every day | ORAL | Status: DC
  Administered 2017-08-03 – 2017-08-06 (×4): 100 mg via ORAL
  Filled 2017-08-02 (×4): qty 1

## 2017-08-02 MED ORDER — VANCOMYCIN HCL IN DEXTROSE 1-5 GM/200ML-% IV SOLN
1000.0000 mg | Freq: Once | INTRAVENOUS | Status: AC
  Administered 2017-08-02: 1000 mg via INTRAVENOUS
  Filled 2017-08-02: qty 200

## 2017-08-02 MED ORDER — CHLORHEXIDINE GLUCONATE CLOTH 2 % EX PADS
6.0000 | MEDICATED_PAD | Freq: Every day | CUTANEOUS | Status: DC
  Administered 2017-08-03 – 2017-08-06 (×3): 6 via TOPICAL

## 2017-08-02 MED ORDER — ACETAMINOPHEN 325 MG PO TABS
650.0000 mg | ORAL_TABLET | Freq: Four times a day (QID) | ORAL | Status: DC | PRN
  Filled 2017-08-02: qty 2

## 2017-08-02 MED ORDER — PANTOPRAZOLE SODIUM 40 MG PO TBEC
40.0000 mg | DELAYED_RELEASE_TABLET | Freq: Every day | ORAL | Status: DC
  Administered 2017-08-03 – 2017-08-06 (×4): 40 mg via ORAL
  Filled 2017-08-02 (×4): qty 1

## 2017-08-02 MED ORDER — PIPERACILLIN-TAZOBACTAM 3.375 G IVPB
3.3750 g | Freq: Two times a day (BID) | INTRAVENOUS | Status: DC
  Administered 2017-08-03 – 2017-08-06 (×7): 3.375 g via INTRAVENOUS
  Filled 2017-08-02 (×7): qty 50

## 2017-08-02 MED ORDER — ATORVASTATIN CALCIUM 20 MG PO TABS
20.0000 mg | ORAL_TABLET | Freq: Every day | ORAL | Status: DC
  Administered 2017-08-03 – 2017-08-06 (×4): 20 mg via ORAL
  Filled 2017-08-02 (×4): qty 1

## 2017-08-02 MED ORDER — GLUCERNA SHAKE PO LIQD
237.0000 mL | Freq: Three times a day (TID) | ORAL | Status: DC
  Administered 2017-08-02 – 2017-08-05 (×8): 237 mL via ORAL

## 2017-08-02 MED ORDER — MELATONIN 5 MG PO TABS
5.0000 mg | ORAL_TABLET | Freq: Every day | ORAL | Status: DC
  Administered 2017-08-02 – 2017-08-05 (×4): 5 mg via ORAL
  Filled 2017-08-02 (×5): qty 1

## 2017-08-02 MED ORDER — POTASSIUM CHLORIDE CRYS ER 20 MEQ PO TBCR
40.0000 meq | EXTENDED_RELEASE_TABLET | Freq: Once | ORAL | Status: AC
  Administered 2017-08-02: 40 meq via ORAL

## 2017-08-02 MED ORDER — MUPIROCIN 2 % EX OINT
1.0000 "application " | TOPICAL_OINTMENT | Freq: Two times a day (BID) | CUTANEOUS | Status: DC
  Administered 2017-08-03 – 2017-08-05 (×7): 1 via NASAL
  Filled 2017-08-02: qty 22

## 2017-08-02 MED ORDER — VANCOMYCIN HCL 500 MG IV SOLR
500.0000 mg | Freq: Once | INTRAVENOUS | Status: AC
  Administered 2017-08-02: 500 mg via INTRAVENOUS
  Filled 2017-08-02: qty 500

## 2017-08-02 MED ORDER — SODIUM CHLORIDE 0.9 % IV BOLUS (SEPSIS)
500.0000 mL | Freq: Once | INTRAVENOUS | Status: AC
  Administered 2017-08-02: 500 mL via INTRAVENOUS

## 2017-08-02 MED ORDER — POTASSIUM CHLORIDE CRYS ER 20 MEQ PO TBCR
EXTENDED_RELEASE_TABLET | ORAL | Status: AC
  Filled 2017-08-02: qty 2

## 2017-08-02 MED ORDER — INSULIN ASPART 100 UNIT/ML ~~LOC~~ SOLN: 0.0000 [IU] | Freq: Every day | SUBCUTANEOUS | Status: DC

## 2017-08-02 MED ORDER — INSULIN ASPART 100 UNIT/ML ~~LOC~~ SOLN
0.0000 [IU] | Freq: Three times a day (TID) | SUBCUTANEOUS | Status: DC
  Administered 2017-08-03 – 2017-08-05 (×4): 1 [IU] via SUBCUTANEOUS
  Administered 2017-08-05: 2 [IU] via SUBCUTANEOUS
  Filled 2017-08-02 (×5): qty 1

## 2017-08-02 MED ORDER — ASPIRIN EC 81 MG PO TBEC
81.0000 mg | DELAYED_RELEASE_TABLET | Freq: Every day | ORAL | Status: DC
  Administered 2017-08-03 – 2017-08-06 (×4): 81 mg via ORAL
  Filled 2017-08-02 (×4): qty 1

## 2017-08-02 MED ORDER — TAMSULOSIN HCL 0.4 MG PO CAPS
ORAL_CAPSULE | ORAL | Status: AC
  Filled 2017-08-02: qty 1

## 2017-08-02 MED ORDER — HYDROCODONE-ACETAMINOPHEN 5-325 MG PO TABS
1.0000 | ORAL_TABLET | Freq: Four times a day (QID) | ORAL | Status: DC | PRN
  Administered 2017-08-06: 1 via ORAL
  Filled 2017-08-02: qty 1

## 2017-08-02 MED ORDER — TAMSULOSIN HCL 0.4 MG PO CAPS
0.4000 mg | ORAL_CAPSULE | Freq: Every day | ORAL | Status: DC
  Administered 2017-08-02 – 2017-08-05 (×4): 0.4 mg via ORAL
  Filled 2017-08-02 (×3): qty 1

## 2017-08-02 MED ORDER — ACETAMINOPHEN 650 MG RE SUPP: 650.0000 mg | Freq: Four times a day (QID) | RECTAL | Status: DC | PRN

## 2017-08-02 MED ORDER — HEPARIN SODIUM (PORCINE) 5000 UNIT/ML IJ SOLN
5000.0000 [IU] | Freq: Three times a day (TID) | INTRAMUSCULAR | Status: DC
  Administered 2017-08-02 – 2017-08-06 (×10): 5000 [IU] via SUBCUTANEOUS
  Filled 2017-08-02 (×10): qty 1

## 2017-08-02 NOTE — ED Triage Notes (Signed)
Pt presents via POV s/p dialysis with weakness and fever.

## 2017-08-02 NOTE — ED Notes (Signed)
Patient refusing I&O cath at this time

## 2017-08-02 NOTE — ED Notes (Signed)
Interpreter paged.

## 2017-08-02 NOTE — ED Notes (Signed)
Report given to [facility] MD. Placed on portable monitor by [place] EDT.

## 2017-08-02 NOTE — Consult Note (Signed)
Pharmacy Antibiotic Note  Martin Gilbert is a 76 y.o. male admitted on 08/02/2017 with sepsis.  Pharmacy has been consulted for vancomycin/zosyn dosing. Pt admitted with sepsis on unknown source, possibly catheter related. Pt is an ESRD pt on HD TTSat  Plan: Vancomycin 1g was given in the ED. Will give another 500mg  for a total load of 1500mg .  Vancomycin 750mg  q HD session Trough prior to the 3rd HD session goal 15-25 Zosyn 3.375g q 12 hr EI   Weight: 158 lb 11.7 oz (72 kg)  Temp (24hrs), Avg:99.1 F (37.3 C), Min:99.1 F (37.3 C), Max:99.1 F (37.3 C)   Recent Labs Lab 08/02/17 1508 08/02/17 1533  WBC 25.1*  --   CREATININE 3.45*  --   LATICACIDVEN  --  2.0*    Estimated Creatinine Clearance: 15.6 mL/min (A) (by C-G formula based on SCr of 3.45 mg/dL (H)).    Allergies  Allergen Reactions  . Aripiprazole     Denial of this listed allergy.    Antimicrobials this admission: vancomycin 8/18 >>  zosyn 8/18 >>   Dose adjustments this admission:   Microbiology results: 8/18 BCx:  8/18 UA:     Thank you for allowing pharmacy to be a part of this patient's care.  Ramond Dial, Pharm.D, BCPS Clinical Pharmacist  08/02/2017 6:58 PM

## 2017-08-02 NOTE — ED Provider Notes (Signed)
[facility] Emergency Department Provider Note   ____________________________________________    I have reviewed the triage vital signs and the nursing notes.   HISTORY  Chief Complaint Weakness and Fever   Spanish interpreter used, patient is a poor historian family is attempting to contribute as well  HPI Martin Gilbert is a 76 y.o. male Who presents with weakness. Patient has a history of end-stage renal disease today he received dialysis as usual. Afterward he felt quite shaky and weak. Family noted that he had difficulty getting to the car afterwards. At some point today the patient did feel warm and the family gave Tylenol but his temperature was not checked. He denies shortness of breath, he does still make urine, no dysuria reported. No abdominal pain. No vomiting.   Past Medical History:  Diagnosis Date  . Anemia   . Back pain   . BPH (benign prostatic hyperplasia)   . CKD (chronic kidney disease), stage IV ([place])   . Diabetes mellitus without complication ([place])   . GERD (gastroesophageal reflux disease)   . HTN (hypertension)   . Hyperlipidemia     Patient Active Problem List   Diagnosis Date Noted  . ESRD on dialysis ([place]) 06/03/2017  . Colitis 03/27/2017  . Demand ischemia ([place]) 03/27/2017  . ARF (acute renal failure) ([place]) 03/27/2017  . Enteritis due to Clostridium difficile   . Protein-calorie malnutrition, severe 02/18/2017  . C. difficile colitis 02/15/2017  . UTI (urinary tract infection) 02/02/2017  . E coli infection 02/02/2017  . Lactic acidosis 02/02/2017  . Diabetic neuropathy ([place]) 02/02/2017  . Left-sided chest wall pain 02/02/2017  . Fall 02/02/2017  . Generalized weakness 02/02/2017  . Pink eye, left 02/02/2017  . Leukocytosis 02/02/2017  . Anemia of chronic disease 02/02/2017  . Nausea 02/02/2017  . Sepsis ([place]) 01/31/2017  . Essential hypertension 11/28/2015  . Pain in the chest 02/15/2015  . Neck  pain 02/15/2015  . Diabetes type 2, uncontrolled ([place]) 02/15/2015  . Chronic kidney disease, stage IV (severe) ([place]) 02/15/2015  . Frequent headaches 02/15/2015    Past Surgical History:  Procedure Laterality Date  . AV FISTULA PLACEMENT Left 06/13/2017   Procedure: ARTERIOVENOUS (AV) FISTULA CREATION ( RADIAL CEPHALIC );  Surgeon: Katha Cabal, MD;  Location: [facility] ORS;  Service: Vascular;  Laterality: Left;  . CATARACT EXTRACTION     one eye not sure which  . DIALYSIS/PERMA CATHETER INSERTION N/A 04/03/2017   Procedure: Dialysis/Perma Catheter Insertion;  Surgeon: Algernon Huxley, MD;  Location: [place] [facility];  Service: Cardiovascular;  Laterality: N/A;  . hernia repair      Prior to Admission medications   Medication Sig Start Date End Date Taking? Authorizing Provider  acetaminophen (TYLENOL) 500 MG tablet Take 500 mg by mouth every 8 (eight) hours as needed.    [provider]  allopurinol (ZYLOPRIM) 100 MG tablet Take 100 mg by mouth daily. 10/17/15   [provider]  amLODipine (NORVASC) 2.5 MG tablet Take 2.5 mg by mouth daily.    [provider]  aspirin 81 MG tablet Take 81 mg by mouth daily.    [provider]  atorvastatin (LIPITOR) 20 MG tablet Take 20 mg by mouth daily.    [provider]  feeding supplement, GLUCERNA SHAKE, (GLUCERNA SHAKE) LIQD Take 237 mLs by mouth 3 (three) times daily between meals. 02/02/17   Theodoro Grist, MD  ferrous sulfate 325 (65 FE) MG tablet Take 325 mg  by mouth daily with breakfast. 08/30/15   [provider]  gabapentin (NEURONTIN) 100 MG capsule Take 1 capsule (100 mg total) by mouth every 8 (eight) hours as needed (tingling or numbness in feet). 02/02/17   Theodoro Grist, MD  glimepiride (AMARYL) 2 MG tablet Take 2 mg by mouth 2 (two) times daily.  04/24/17   [provider]  hydrALAZINE (APRESOLINE) 50 MG tablet Take 50 mg by mouth 3 (three) times daily.    [provider]  HYDROcodone-acetaminophen (NORCO) 5-325 MG tablet Take 1-2 tablets by mouth every 6 (six) hours as needed for moderate pain or severe pain. 06/13/17   Schnier, Dolores Lory, MD  Melatonin 5 MG TABS Take 1 tablet (5 mg total) by mouth at bedtime. 04/04/17   Gladstone Lighter, MD  metoprolol succinate (TOPROL-XL) 25 MG 24 hr tablet Take 25 mg by mouth daily.    [provider]  ondansetron (ZOFRAN) 4 MG tablet Take 1 tablet (4 mg total) by mouth every 6 (six) hours as needed for nausea. 02/02/17   Theodoro Grist, MD  pantoprazole (PROTONIX) 40 MG tablet Take 1 tablet (40 mg total) by mouth daily. 02/03/17   Theodoro Grist, MD  Polyvinyl Alcohol-Povidone (REFRESH OP) Place 1 drop into both eyes 2 (two) times daily.    [provider]  sodium bicarbonate 650 MG tablet Take 650 mg by mouth 2 (two) times daily.     [provider]  tamsulosin (FLOMAX) 0.4 MG CAPS capsule Take 1 capsule (0.4 mg total) by mouth daily after supper. 02/19/17   Epifanio Lesches, MD  vancomycin (VANCOCIN) 50 mg/mL oral solution Take 2.5 mLs (125 mg total) by mouth 4 (four) times daily. Until 04/14/17 04/04/17   Gladstone Lighter, MD  vancomycin (VANCOCIN) 50 mg/mL oral solution Take 2.5 mLs (125 mg total) by mouth 2 (two) times daily. For 1 week starting 04/15/17 04/15/17   Gladstone Lighter, MD  vancomycin (VANCOCIN) 50 mg/mL oral solution Take 2.5 mLs (125 mg total) by mouth daily. X 1 week starting 04/22/17 04/22/17   Gladstone Lighter, MD  vancomycin (VANCOCIN) 50 mg/mL oral solution Take 2.5 mLs (125 mg total) by mouth every other day. Starting 04/30/17 04/30/17   Gladstone Lighter, MD  vancomycin (VANCOCIN) 50 mg/mL oral solution Take 2.5 mLs (125 mg total) by mouth every 3 (three) days. X 1 week starting 05/08/17 05/08/17   Gladstone Lighter, MD  Vitamin D, Cholecalciferol, 1000 UNITS TABS Take by mouth.    [provider]     Allergies Aripiprazole  Family History  Problem  Relation Age of Onset  . Diabetes Neg Hx   . Hypertension Neg Hx     Social History Social History  Substance Use Topics  . Smoking status: Never Smoker  . Smokeless tobacco: Never Used  . Alcohol use No     Comment: Drank in the past, but has stopped for several years.     Review of Systems  Constitutional: felt warm as above Eyes: No visual changes.  ENT: No sore throat. Cardiovascular: Denies chest pain. Respiratory: Denies shortness of breath. Gastrointestinal: No abdominal pain.  No nausea, no vomiting.   Genitourinary: Negative for dysuria. Musculoskeletal: Negative for back pain. Skin: Negative for rash. Neurological: Negative for headaches   ____________________________________________   PHYSICAL EXAM:  VITAL SIGNS: ED Triage Vitals  Enc Vitals Group     BP 08/02/17 1505 (!) 87/41     Pulse Rate 08/02/17 1505 83     Resp  08/02/17 1505 14     Temp 08/02/17 1505 99.1 F (37.3 C)     Temp Source 08/02/17 1505 Oral     SpO2 08/02/17 1505 94 %     Weight 08/02/17 1506 72 kg (158 lb 11.7 oz)     Height --      Head Circumference --      Peak Flow --      Pain Score --      Pain Loc --      Pain Edu? --      Excl. in [place]? --     Constitutional: Alert and oriented. No acute distress.  Eyes: Conjunctivae are normal.  Head: Atraumatic. Nose: No congestion/rhinnorhea. Mouth/Throat: Mucous membranes are moist.   Neck:  Painless ROM, no vertebral tenderness palpation Cardiovascular: Normal rate, regular rhythm. Grossly normal heart sounds.  Good peripheral circulation. Respiratory: Normal respiratory effort.  No retractions. .bibasilar rales, nonspecific Gastrointestinal: Soft and nontender. No distention.  No CVA tenderness. Genitourinary: deferred Musculoskeletal:   Warm and well perfused Neurologic:  Normal speech and language. No gross focal neurologic deficits are appreciated.  Skin:  Skin is warm, dry and intact. No rash noted. Psychiatric: Mood and  affect are normal. Speech and behavior are normal.  ____________________________________________   LABS (all labs ordered are listed, but only abnormal results are displayed)  Labs Reviewed  LACTIC ACID, PLASMA - Abnormal; Notable for the following:       Result Value   Lactic Acid, Venous 2.0 (*)    All other components within normal limits  COMPREHENSIVE METABOLIC PANEL - Abnormal; Notable for the following:    Potassium 2.9 (*)    Chloride 99 (*)    Glucose, Bld 162 (*)    BUN 23 (*)    Creatinine, Ser 3.45 (*)    Calcium 8.4 (*)    Albumin 3.3 (*)    AST 54 (*)    Alkaline Phosphatase 406 (*)    Total Bilirubin 1.4 (*)    GFR calc non Af Amer 16 (*)    GFR calc Af Amer 18 (*)    All other components within normal limits  CBC WITH DIFFERENTIAL/PLATELET - Abnormal; Notable for the following:    WBC 25.1 (*)    RBC 4.01 (*)    Hemoglobin 12.2 (*)    HCT 36.2 (*)    RDW 14.9 (*)    Neutro Abs 23.6 (*)    Lymphs Abs 0.7 (*)    All other components within normal limits  TROPONIN I - Abnormal; Notable for the following:    Troponin I 0.09 (*)    All other components within normal limits  GLUCOSE, CAPILLARY - Abnormal; Notable for the following:    Glucose-Capillary 172 (*)    All other components within normal limits  URINE CULTURE  CULTURE, BLOOD (ROUTINE X 2)  CULTURE, BLOOD (ROUTINE X 2)  LACTIC ACID, PLASMA  URINALYSIS, ROUTINE W REFLEX MICROSCOPIC   ____________________________________________  EKG  ED ECG REPORT I, Lavonia Drafts, the attending physician, personally viewed and interpreted this ECG.  Date: 08/02/2017  Rate: 82 Rhythm: normal sinus rhythm QRS Axis: normal Intervals: rbbb ST/T Wave abnormalities: nonspecific   ____________________________________________  RADIOLOGY  Chest x-ray unremarkable ____________________________________________   PROCEDURES  Procedure(s) performed: No    Critical Care performed:  No ____________________________________________   INITIAL IMPRESSION / ASSESSMENT AND PLAN / ED COURSE  Pertinent labs & imaging results that were available during my care of the patient  were reviewed by me and considered in my medical decision making (see chart for details).  Patient presents with weakness after dialysis. There is a report of a fever but is not clear how this was validated. Patient is afebrile here.His blood pressure is on the low side but this is not terribly abnormal for him. We will check labs including a lactic acid to evaluate for sepsis. I suspect the patient may just be volume depleted after dialysis but we will monitor carefully and give a small bolus of IV fluids.  Patient with elevated white blood cell count, I will initiate antibiotics and send blood cultures as suspicion for sepsis is stronger  Lactic acid is also elevated.  Patient with elevated alkaline phosphatase but he has no tenderness of the abdomen  Mildly elevated troponin likely related to ESRD.  I hydrated the patient carefully given history. Do not feel his blood pressure is secondary to sepsis/shock. Review of records demonstrates his blood pressure is consistent with his normal levels.     ____________________________________________   FINAL CLINICAL IMPRESSION(S) / ED DIAGNOSES  Final diagnoses:  Sepsis, due to unspecified organism [facility])      NEW MEDICATIONS STARTED DURING THIS VISIT:  New Prescriptions   No medications on file     Note:  This document was prepared using Dragon voice recognition software and may include unintentional dictation errors.    Lavonia Drafts, MD 08/02/17 [phone]

## 2017-08-02 NOTE — H&P (Signed)
[facility] NAME: Martin Gilbert    MR#:  [id number]  DATE OF BIRTH:  1941-03-11  DATE OF ADMISSION:  08/02/2017  PRIMARY CARE PHYSICIAN: [name], MD   REQUESTING/REFERRING PHYSICIAN: Dr Lavonia Drafts  CHIEF COMPLAINT:   Chief Complaint  Patient presents with  . Weakness  . Fever    HISTORY OF PRESENT ILLNESS:  Martin Gilbert  is a 76 y.o. male with a known history of End-stage renal disease. He presents with fever today and cold chills. He's also had some low blood pressure which is not usual for him. He had a full dialysis session today. He's been feeling very weak and no appetite. He's had an occasional headache. He has a little neck pain. He has trouble urinating. In the ER, he was found to have a lactic acidosis and an elevated white blood cell count. Urine analysis is still pending at this time. Hospitalist services contacted for further evaluation.  PAST MEDICAL HISTORY:   Past Medical History:  Diagnosis Date  . Anemia   . Back pain   . BPH (benign prostatic hyperplasia)   . CKD (chronic kidney disease), stage IV ([place])   . Clostridium difficile colitis   . Diabetes mellitus without complication ([employer])   . GERD (gastroesophageal reflux disease)   . HTN (hypertension)   . Hyperlipidemia     PAST SURGICAL HISTORY:   Past Surgical History:  Procedure Laterality Date  . AV FISTULA PLACEMENT Left 06/13/2017   Procedure: ARTERIOVENOUS (AV) FISTULA CREATION ( RADIAL CEPHALIC );  Surgeon: Katha Cabal, MD;  Location: [facility] ORS;  Service: Vascular;  Laterality: Left;  . CATARACT EXTRACTION     one eye not sure which  . DIALYSIS/PERMA CATHETER INSERTION N/A 04/03/2017   Procedure: Dialysis/Perma Catheter Insertion;  Surgeon: Algernon Huxley, MD;  Location: [facility];  Service: Cardiovascular;  Laterality: N/A;  . hernia repair      SOCIAL HISTORY:   Social History   Substance Use Topics  . Smoking status: Never Smoker  . Smokeless tobacco: Never Used  . Alcohol use No     Comment: Drank in the past, but has stopped for several years.     FAMILY HISTORY:   Family History  Problem Relation Age of Onset  . Hypertension Mother   . Diabetes Neg Hx     DRUG ALLERGIES:   Allergies  Allergen Reactions  . Aripiprazole     Denial of this listed allergy.    REVIEW OF SYSTEMS:  CONSTITUTIONAL: Positive for cold chills and fever. positive for  fatigue and  weakness. Positive for weight loss. Positive for decreased appetite. Positive for headache.  EYES: No blurred or double vision.  EARS, NOSE, AND THROAT: No tinnitus or ear pain. No sore throat. Decreased hearing right ear  RESPIRATORY: No cough, shortness of breath, wheezing or hemoptysis.  CARDIOVASCULAR: No chest pain, orthopnea, edema.  GASTROINTESTINAL:  some nausea, vomiting the other day. No  diarrhea or abdominal pain. No blood in bowel movements GENITOURINARY: Very little urine ENDOCRINE: No polyuria, nocturia, HEMATOLOGY: No anemia, easy bruising or bleeding SKIN: No rash or lesion. MUSCULOSKELETAL: No joint pain or arthritis.   NEUROLOGIC: No tingling, numbness, weakness.  PSYCHIATRY: No anxiety or depression.   MEDICATIONS AT HOME:   Prior to Admission medications   Medication Sig Start Date End Date Taking? Authorizing Provider  acetaminophen (TYLENOL) 500 MG tablet Take 500 mg by mouth every  8 (eight) hours as needed.    [provider]  allopurinol (ZYLOPRIM) 100 MG tablet Take 100 mg by mouth daily. 10/17/15   [provider]  amLODipine (NORVASC) 2.5 MG tablet Take 2.5 mg by mouth daily.    [provider]  aspirin 81 MG tablet Take 81 mg by mouth daily.    [provider]  atorvastatin (LIPITOR) 20 MG tablet Take 20 mg by mouth daily.    [provider]  feeding supplement, GLUCERNA SHAKE, (GLUCERNA SHAKE) LIQD Take 237 mLs by  mouth 3 (three) times daily between meals. 02/02/17   Theodoro Grist, MD  ferrous sulfate 325 (65 FE) MG tablet Take 325 mg by mouth daily with breakfast. 08/30/15   [provider]  gabapentin (NEURONTIN) 100 MG capsule Take 1 capsule (100 mg total) by mouth every 8 (eight) hours as needed (tingling or numbness in feet). 02/02/17   Theodoro Grist, MD  glimepiride (AMARYL) 2 MG tablet Take 2 mg by mouth 2 (two) times daily.  04/24/17   [provider]  hydrALAZINE (APRESOLINE) 50 MG tablet Take 50 mg by mouth 3 (three) times daily.    [provider]  HYDROcodone-acetaminophen (NORCO) 5-325 MG tablet Take 1-2 tablets by mouth every 6 (six) hours as needed for moderate pain or severe pain. 06/13/17   Schnier, Dolores Lory, MD  Melatonin 5 MG TABS Take 1 tablet (5 mg total) by mouth at bedtime. 04/04/17   Gladstone Lighter, MD  metoprolol succinate (TOPROL-XL) 25 MG 24 hr tablet Take 25 mg by mouth daily.    [provider]  ondansetron (ZOFRAN) 4 MG tablet Take 1 tablet (4 mg total) by mouth every 6 (six) hours as needed for nausea. 02/02/17   Theodoro Grist, MD  pantoprazole (PROTONIX) 40 MG tablet Take 1 tablet (40 mg total) by mouth daily. 02/03/17   Theodoro Grist, MD  Polyvinyl Alcohol-Povidone (REFRESH OP) Place 1 drop into both eyes 2 (two) times daily.    [provider]  sodium bicarbonate 650 MG tablet Take 650 mg by mouth 2 (two) times daily.     [provider]  tamsulosin (FLOMAX) 0.4 MG CAPS capsule Take 1 capsule (0.4 mg total) by mouth daily after supper. 02/19/17   Epifanio Lesches, MD  vancomycin (VANCOCIN) 50 mg/mL oral solution Take 2.5 mLs (125 mg total) by mouth 4 (four) times daily. Until 04/14/17 04/04/17   Gladstone Lighter, MD  vancomycin (VANCOCIN) 50 mg/mL oral solution Take 2.5 mLs (125 mg total) by mouth 2 (two) times daily. For 1 week starting 04/15/17 04/15/17   Gladstone Lighter, MD  vancomycin (VANCOCIN) 50 mg/mL oral solution  Take 2.5 mLs (125 mg total) by mouth daily. X 1 week starting 04/22/17 04/22/17   Gladstone Lighter, MD  vancomycin (VANCOCIN) 50 mg/mL oral solution Take 2.5 mLs (125 mg total) by mouth every other day. Starting 04/30/17 04/30/17   Gladstone Lighter, MD  vancomycin (VANCOCIN) 50 mg/mL oral solution Take 2.5 mLs (125 mg total) by mouth every 3 (three) days. X 1 week starting 05/08/17 05/08/17   Gladstone Lighter, MD  Vitamin D, Cholecalciferol, 1000 UNITS TABS Take by mouth.    [provider]      VITAL SIGNS:  Blood pressure 114/88, pulse 72, temperature 99.1 F (37.3 C), temperature source Oral, resp. rate 15, weight 72 kg (158 lb 11.7 oz), SpO2 98 %.  PHYSICAL EXAMINATION:  GENERAL:  76 y.o.-year-old patient lying in the bed with no acute distress.  EYES: Pupils equal, round, reactive to light and accommodation. No scleral icterus. Extraocular muscles intact.  HEENT: Head atraumatic, normocephalic. Oropharynx and nasopharynx clear.  NECK:  Supple, no jugular venous distention. No thyroid enlargement, no tenderness. Good range of motion in the neck. Some pain to palpation over the lower cervical spine  LUNGS: Normal breath sounds bilaterally, no wheezing, rales,rhonchi or crepitation. No use of accessory muscles of respiration.  CARDIOVASCULAR: S1, S2 normal. No murmurs, rubs, or gallops.  ABDOMEN: Soft, nontender, nondistended. Bowel sounds present. No organomegaly or mass.  EXTREMITIES: No pedal edema, cyanosis, or clubbing.  NEUROLOGIC: Cranial nerves II through XII are intact. Muscle strength 5/5 in all extremities. Sensation intact. Gait not checked. Negative Kernig and Brudzinski sign  PSYCHIATRIC: The patient is alert and oriented x 3.  SKIN: No rash, lesion, or ulcer.   LABORATORY PANEL:   CBC  Recent Labs Lab 08/02/17 1508  WBC 25.1*  HGB 12.2*  HCT 36.2*  PLT 205    ------------------------------------------------------------------------------------------------------------------  Chemistries   Recent Labs Lab 08/02/17 1508  NA 136  K 2.9*  CL 99*  CO2 25  GLUCOSE 162*  BUN 23*  CREATININE 3.45*  CALCIUM 8.4*  AST 54*  ALT 39  ALKPHOS 406*  BILITOT 1.4*   ------------------------------------------------------------------------------------------------------------------  Cardiac Enzymes  Recent Labs Lab 08/02/17 1508  TROPONINI 0.09*   ------------------------------------------------------------------------------------------------------------------  RADIOLOGY:  Dg Chest 1 View  Result Date: 08/02/2017 CLINICAL DATA:  Fever.  Renal failure EXAM: CHEST 1 VIEW COMPARISON:  March 28, 2017 FINDINGS: Central catheter tip is at cavoatrial junction. No pneumothorax. There is no appreciable edema or consolidation. Heart size and pulmonary vascularity are normal. No adenopathy. No bone lesions. IMPRESSION: Central catheter tip at cavoatrial junction. No pneumothorax. No edema or consolidation. Cardiac silhouette within normal limits. Electronically Signed   By: Lowella Grip III M.D.   On: 08/02/2017 16:08    EKG:   Normal sinus rhythm 82 bpm, right bundle branch block and left anterior fascicular block  IMPRESSION AND PLAN:   1. Clinical sepsis with fever, relative hypotension and leukocytosis. Source could be catheter in the right chest. Urine still pending at this time. Patient also having pain in his neck.  This is not a meningitis. I will get a MRI of the cervical spine to rule out discitis. Empiric antibiotics with vancomycin and Zosyn for now. Follow-up blood cultures. Of note the patient has had a history of C. difficile colitis in the past. Hold antihypertensive medications at this point 2. Hypokalemia. Replace one dose of potassium orally 3. BPH on Flomax 4. Type 2 diabetes mellitus. Place on sliding scale 5. End-stage renal  disease on dialysis had full dialysis today. Consult nephrology  All the records are reviewed and case discussed with ED provider. Management plans discussed with the patient, family and they are in agreement.  CODE STATUS: Full code  TOTAL TIME TAKING CARE OF THIS PATIENT:  55 minutes. Translator present during the entire history and physical.   Loletha Grayer M.D on 08/02/2017 at 6:43 PM  Between 7am to 6pm - Pager - [phone]  After 6pm call admission pager [phone]  [facility]  [phone]  CC: Primary care physician; [name], MD

## 2017-08-03 ENCOUNTER — Inpatient Hospital Stay: Payer: Medicare Other

## 2017-08-03 LAB — URINALYSIS, ROUTINE W REFLEX MICROSCOPIC
BILIRUBIN URINE: NEGATIVE
Glucose, UA: 50 mg/dL — AB
KETONES UR: NEGATIVE mg/dL
Nitrite: NEGATIVE
Protein, ur: 100 mg/dL — AB
Specific Gravity, Urine: 1.01 (ref 1.005–1.030)
pH: 6 (ref 5.0–8.0)

## 2017-08-03 LAB — BASIC METABOLIC PANEL
ANION GAP: 7 (ref 5–15)
BUN: 37 mg/dL — ABNORMAL HIGH (ref 6–20)
CHLORIDE: 104 mmol/L (ref 101–111)
CO2: 25 mmol/L (ref 22–32)
Calcium: 7.9 mg/dL — ABNORMAL LOW (ref 8.9–10.3)
Creatinine, Ser: 4.19 mg/dL — ABNORMAL HIGH (ref 0.61–1.24)
GFR calc non Af Amer: 12 mL/min — ABNORMAL LOW (ref >60)
GFR, EST AFRICAN AMERICAN: 14 mL/min — AB (ref >60)
GLUCOSE: 87 mg/dL (ref 65–99)
Potassium: 3.8 mmol/L (ref 3.5–5.1)
Sodium: 136 mmol/L (ref 135–145)

## 2017-08-03 LAB — GLUCOSE, CAPILLARY
GLUCOSE-CAPILLARY: 147 mg/dL — AB (ref 65–99)
GLUCOSE-CAPILLARY: 146 mg/dL — AB (ref 65–99)
Glucose-Capillary: 77 mg/dL (ref 65–99)
Glucose-Capillary: 158 mg/dL — ABNORMAL HIGH (ref 65–99)

## 2017-08-03 LAB — CBC
HCT: 30.8 % — ABNORMAL LOW (ref 40.0–52.0)
HEMOGLOBIN: 10.4 g/dL — AB (ref 13.0–18.0)
MCH: 31.4 pg (ref 26.0–34.0)
MCHC: 33.9 g/dL (ref 32.0–36.0)
MCV: 92.5 fL (ref 80.0–100.0)
Platelets: 173 10*3/uL (ref 150–440)
RBC: 3.33 MIL/uL — AB (ref 4.40–5.90)
RDW: 14.7 % — ABNORMAL HIGH (ref 11.5–14.5)
WBC: 18.5 10*3/uL — ABNORMAL HIGH (ref 3.8–10.6)

## 2017-08-03 NOTE — Progress Notes (Addendum)
When pt arrived to 224 08/02/2017, he just had a 2X2 gauze and paper tape dressing that was applied to dialysis catheter site. RN cleaned and placed a sterile dressing to dialysis catheter site.   Martin Gilbert [employer]

## 2017-08-03 NOTE — Progress Notes (Signed)
[facility]  ROUNDING NOTE   Subjective:   History taken with assistance of interpreter. Wife and family at bedside who speak Spanish.   Mr. Hamdan Toscano admitted to [facility] on 08/02/2017 for Sepsis, due to unspecified organism [facility]) [A41.9]   Patient recently returned from a one month trip to [place]. Returned on Monday and got dialysis at [facility] three days this week. He started to have fevers and chills on the last 30 minutes of hemodialysis yesterday.   He went home where he was having more fevers and chills and brought to [facility]. Blood cultures and urine cultures with no growth. Does not seem cultures were done at outpatient dialysis.    Objective:  Vital signs in last 24 hours:  Temp:  [98 F (36.7 C)-99.1 F (37.3 C)] 98.6 F (37 C) (08/19 0402) Pulse Rate:  [58-83] 58 (08/19 0402) Resp:  [10-21] 18 (08/19 0402) BP: (83-120)/(30-88) 108/44 (08/19 0402) SpO2:  [91 %-98 %] 96 % (08/19 0402) Weight:  [72 kg (158 lb 11.7 oz)] 72 kg (158 lb 11.7 oz) (08/18 1506)  Weight change:  Filed Weights   08/02/17 1506  Weight: 72 kg (158 lb 11.7 oz)    Intake/Output: I/O last 3 completed shifts: In: 700 [IV Piggyback:700] Out: 100 [Urine:100]   Intake/Output this shift:  No intake/output data recorded.  Physical Exam: General: NAD, laying in bed  Head: Normocephalic, atraumatic. Moist oral mucosal membranes  Eyes: Anicteric, PERRL  Neck: Supple, trachea midline  Lungs:  Clear to auscultation  Heart: Regular rate and rhythm  Abdomen:  Soft, nontender,   Extremities: no peripheral edema.  Neurologic: Nonfocal, moving all four extremities  Skin: No lesions  Access: RIJ permcath, left forearm AVF - maturing    Basic Metabolic Panel:  Recent Labs Lab 08/02/17 1508 08/03/17 0521  NA 136 136  K 2.9* 3.8  CL 99* 104  CO2 25 25  GLUCOSE 162* 87  BUN 23* 37*  CREATININE 3.45* 4.19*  CALCIUM 8.4* 7.9*    Liver Function Tests:  Recent Labs Lab  08/02/17 1508  AST 54*  ALT 39  ALKPHOS 406*  BILITOT 1.4*  PROT 7.5  ALBUMIN 3.3*   No results for input(s): LIPASE, AMYLASE in the last 168 hours. No results for input(s): AMMONIA in the last 168 hours.  CBC:  Recent Labs Lab 08/02/17 1508 08/03/17 0521  WBC 25.1* 18.5*  NEUTROABS 23.6*  --   HGB 12.2* 10.4*  HCT 36.2* 30.8*  MCV 90.3 92.5  PLT 205 173    Cardiac Enzymes:  Recent Labs Lab 08/02/17 1508  TROPONINI 0.09*    BNP: Invalid input(s): POCBNP  CBG:  Recent Labs Lab 08/02/17 1520 08/02/17 2046 08/03/17 0805 08/03/17 1143  GLUCAP 172* 156* 40 147*    Microbiology: Results for orders placed or performed during the hospital encounter of 08/02/17  Blood culture (routine x 2)     Status: None (Preliminary result)   Collection Time: 08/02/17  4:15 PM  Result Value Ref Range Status   Specimen Description BLOOD BLRA  Final   Special Requests   Final    BOTTLES DRAWN AEROBIC AND ANAEROBIC Blood Culture adequate volume   Culture NO GROWTH < 24 HOURS  Final   Report Status PENDING  Incomplete  Blood culture (routine x 2)     Status: None (Preliminary result)   Collection Time: 08/02/17  4:20 PM  Result Value Ref Range Status   Specimen Description BLOOD LAC  Final  Special Requests   Final    BOTTLES DRAWN AEROBIC AND ANAEROBIC Blood Culture adequate volume   Culture NO GROWTH < 24 HOURS  Final   Report Status PENDING  Incomplete  MRSA PCR Screening     Status: Abnormal   Collection Time: 08/02/17  8:36 PM  Result Value Ref Range Status   MRSA by PCR POSITIVE (A) NEGATIVE Final    Comment:        The GeneXpert MRSA Assay (FDA approved for NASAL specimens only), is one component of a comprehensive MRSA colonization surveillance program. It is not intended to diagnose MRSA infection nor to guide or monitor treatment for MRSA infections. RESULT CALLED TO, READ BACK BY AND VERIFIED WITH: STACEY CLAY AT 2304 08/02/17.PMH     Coagulation  Studies: No results for input(s): LABPROT, INR in the last 72 hours.  Urinalysis:  Recent Labs  08/02/17 0245  COLORURINE YELLOW*  LABSPEC 1.010  PHURINE 6.0  GLUCOSEU 50*  HGBUR SMALL*  BILIRUBINUR NEGATIVE  KETONESUR NEGATIVE  PROTEINUR 100*  NITRITE NEGATIVE  LEUKOCYTESUR LARGE*      Imaging: Dg Chest 1 View  Result Date: 08/02/2017 CLINICAL DATA:  Fever.  Renal failure EXAM: CHEST 1 VIEW COMPARISON:  March 28, 2017 FINDINGS: Central catheter tip is at cavoatrial junction. No pneumothorax. There is no appreciable edema or consolidation. Heart size and pulmonary vascularity are normal. No adenopathy. No bone lesions. IMPRESSION: Central catheter tip at cavoatrial junction. No pneumothorax. No edema or consolidation. Cardiac silhouette within normal limits. Electronically Signed   By: Lowella Grip III M.D.   On: 08/02/2017 16:08     Medications:   . piperacillin-tazobactam (ZOSYN)  IV Stopped (08/03/17 0725)  . [START ON 08/05/2017] vancomycin     . allopurinol  100 mg Oral Daily  . aspirin EC  81 mg Oral Daily  . atorvastatin  20 mg Oral Daily  . Chlorhexidine Gluconate Cloth  6 each Topical Q0600  . feeding supplement (GLUCERNA SHAKE)  237 mL Oral TID BM  . heparin  5,000 Units Subcutaneous Q8H  . insulin aspart  0-5 Units Subcutaneous QHS  . insulin aspart  0-9 Units Subcutaneous TID WC  . Melatonin  5 mg Oral QHS  . mupirocin ointment  1 application Nasal BID  . pantoprazole  40 mg Oral Daily  . tamsulosin  0.4 mg Oral QPC supper   acetaminophen **OR** acetaminophen, HYDROcodone-acetaminophen, ondansetron **OR** ondansetron (ZOFRAN) IV  Assessment/ Plan:  Mr. Brainard Highfill is a 76 y.o. Hispanic male with end stage renal disease on hemodialysis, diabetes mellitus type II, hypertension, hepatic cirrhosis, BPH, gout, diabetic peripheral neuropathy  TTS [facility] RIJ permcath  1. End Stage Renal Disease: hemodialysis treatment yesterday as  outpatient via permcath.  - Continue TTS schedule Complication with dialysis device: Concern for blood stream infection. Cultures are negative but with symptoms and recent international travel. Recommend exchanging out permcath - Consult vascular surgery.  - Empiric pip/tazo and vanco - pending cultures - no growth so far  2. Hypertension: hypotensive on admission. Holding home regimen of amlodipine, doxazosin, metoprolol and hydralazine.   3. Diabetes mellitus type II with chronic kidney disease: non-insulin dependent. Hemoglobin A1c 6.2% on 8/16. - continue glucose control.   4. Anemia of chronic kidney disease: hemoglobin 10.4 - EPO as outpatient.   5. Secondary Hyperparathyroidism: outpatient PTH low at 142. Calcium and phosphorus at goal.  - Not currently on phos binders.    LOS: Stuart, Zayyan Mullen 8/19/201812:27  PM

## 2017-08-03 NOTE — Progress Notes (Signed)
Martin Gilbert at [place] NAME: Martin Gilbert    MR#:  [id number]  DATE OF BIRTH:  [birth year]  SUBJECTIVE:  Via interpreter. Daughter in the room. Patient feels a lot better today. No fever. Eating well. Denies any pain or discharge at the perm catheter site. No cough. Makes very little urine.  REVIEW OF SYSTEMS:   Review of Systems  Constitutional: Negative for chills, fever and weight loss.  HENT: Negative for ear discharge, ear pain and nosebleeds.   Eyes: Negative for blurred vision, pain and discharge.  Respiratory: Negative for sputum production, shortness of breath, wheezing and stridor.   Cardiovascular: Negative for chest pain, palpitations, orthopnea and PND.  Gastrointestinal: Negative for abdominal pain, diarrhea, nausea and vomiting.  Genitourinary: Negative for frequency and urgency.  Musculoskeletal: Negative for back pain and joint pain.  Neurological: Negative for sensory change, speech change, focal weakness and weakness.  Psychiatric/Behavioral: Negative for depression and hallucinations. The patient is not nervous/anxious.    Tolerating Diet:yes Tolerating PT: pending  DRUG ALLERGIES:   Allergies  Allergen Reactions  . Aripiprazole     Denial of this listed allergy.    VITALS:  Blood pressure (!) 108/44, pulse (!) 58, temperature 98.6 F (37 C), temperature source Oral, resp. rate 18, weight 72 kg (158 lb 11.7 oz), SpO2 96 %.  PHYSICAL EXAMINATION:   Physical Exam  GENERAL:  76 y.o.-year-old patient lying in the bed with no acute distress.  EYES: Pupils equal, round, reactive to light and accommodation. No scleral icterus. Extraocular muscles intact.  HEENT: Head atraumatic, normocephalic. Oropharynx and nasopharynx clear.  NECK:  Supple, no jugular venous distention. No thyroid enlargement, no tenderness.  LUNGS: Normal breath sounds bilaterally, no wheezing, rales, rhonchi. No use of accessory  muscles of respiration. I looks normal. No cellulitis. No pus discharge. CARDIOVASCULAR: S1, S2 normal. No murmurs, rubs, or gallops.  ABDOMEN: Soft, nontender, nondistended. Bowel sounds present. No organomegaly or mass.  EXTREMITIES: No cyanosis, clubbing or edema b/l.    NEUROLOGIC: Cranial nerves II through XII are intact. No focal Motor or sensory deficits b/l.   PSYCHIATRIC:  patient is alert and oriented x 3.  SKIN: No obvious rash, lesion, or ulcer.   LABORATORY PANEL:  CBC  Recent Labs Lab 08/03/17 0521  WBC 18.5*  HGB 10.4*  HCT 30.8*  PLT 173    Chemistries   Recent Labs Lab 08/02/17 1508 08/03/17 0521  NA 136 136  K 2.9* 3.8  CL 99* 104  CO2 25 25  GLUCOSE 162* 87  BUN 23* 37*  CREATININE 3.45* 4.19*  CALCIUM 8.4* 7.9*  AST 54*  --   ALT 39  --   ALKPHOS 406*  --   BILITOT 1.4*  --    Cardiac Enzymes  Recent Labs Lab 08/02/17 1508  TROPONINI 0.09*   RADIOLOGY:  Dg Chest 1 View  Result Date: 08/02/2017 CLINICAL DATA:  Fever.  Renal failure EXAM: CHEST 1 VIEW COMPARISON:  March 28, 2017 FINDINGS: Central catheter tip is at cavoatrial junction. No pneumothorax. There is no appreciable edema or consolidation. Heart size and pulmonary vascularity are normal. No adenopathy. No bone lesions. IMPRESSION: Central catheter tip at cavoatrial junction. No pneumothorax. No edema or consolidation. Cardiac silhouette within normal limits. Electronically Signed   By: Lowella Grip III M.D.   On: 08/02/2017 16:08   ASSESSMENT AND PLAN:  Martin Gilbert  is a 76 y.o. male with a known history  of End-stage renal disease. He presents with fever today and cold chills. He's also had some low blood pressure which is not usual for him. He had a full dialysis session today. He's been feeling very weak and no appetite. He's had an occasional headache. He has a little neck pain  1. Clinical sepsis with fever, relative hypotension and leukocytosis. -empirically on IV  vancomycin and Zosyn -No fever -Source not identified, PermCath site looks okay -Await blood cultures -Monitor white count -?viral syndrome---given fever and neck pain and headache body aches and weakness  2. Hypokalemia. Replace one dose of potassium orally  3. BPH on Flomax  4. Type 2 diabetes mellitus. Place on sliding scale  5. End-stage renal disease on dialysis had full dialysis today. Consulted nephrology  Case discussed with Care Management/Social Worker. Management plans discussed with the patient, family and they are in agreement.  CODE STATUS: full  DVT Prophylaxis: heparin  TOTAL TIME TAKING CARE OF THIS PATIENT: 30 minutes.  >50% time spent on counselling and coordination of care  POSSIBLE D/C IN *1-2* DAYS, DEPENDING ON CLINICAL CONDITION.  Note: This dictation was prepared with Dragon dictation along with smaller phrase technology. Any transcriptional errors that result from this process are unintentional.  Martin Gilbert M.D on 08/03/2017 at 11:29 AM  Between 7am to 6pm - Pager - [phone]  After 6pm go to [email or web address] - password [email or web address]  Office  [phone]  CC: Primary care physician; [name], MD

## 2017-08-04 ENCOUNTER — Encounter
Admission: EM | Disposition: A | Discharge status: Home / Self Care | Payer: Self-pay | Source: Home / Self Care | Attending: Internal Medicine

## 2017-08-04 DIAGNOSIS — N186 End stage renal disease: Secondary | ICD-10-CM

## 2017-08-04 HISTORY — PX: DIALYSIS/PERMA CATHETER REMOVAL: CATH118289

## 2017-08-04 LAB — GLUCOSE, CAPILLARY
GLUCOSE-CAPILLARY: 96 mg/dL (ref 65–99)
GLUCOSE-CAPILLARY: 97 mg/dL (ref 65–99)
GLUCOSE-CAPILLARY: 149 mg/dL — AB (ref 65–99)
Glucose-Capillary: 112 mg/dL — ABNORMAL HIGH (ref 65–99)
Glucose-Capillary: 134 mg/dL — ABNORMAL HIGH (ref 65–99)

## 2017-08-04 LAB — CBC
HEMATOCRIT: 32.9 % — AB (ref 40.0–52.0)
HEMOGLOBIN: 11.1 g/dL — AB (ref 13.0–18.0)
MCH: 30.8 pg (ref 26.0–34.0)
MCHC: 33.6 g/dL (ref 32.0–36.0)
MCV: 91.6 fL (ref 80.0–100.0)
Platelets: 215 10*3/uL (ref 150–440)
RBC: 3.59 MIL/uL — ABNORMAL LOW (ref 4.40–5.90)
RDW: 15.5 % — ABNORMAL HIGH (ref 11.5–14.5)
WBC: 8.2 10*3/uL (ref 3.8–10.6)

## 2017-08-04 LAB — URINE CULTURE

## 2017-08-04 SURGERY — DIALYSIS/PERMA CATHETER REMOVAL
Anesthesia: LOCAL

## 2017-08-04 MED ORDER — LIDOCAINE-EPINEPHRINE (PF) 2 %-1:200000 IJ SOLN
INTRAMUSCULAR | Status: AC
  Filled 2017-08-04: qty 20

## 2017-08-04 MED ORDER — LIDOCAINE-EPINEPHRINE (PF) 1 %-1:200000 IJ SOLN
INTRAMUSCULAR | Status: DC | PRN
  Administered 2017-08-04: 10 mL

## 2017-08-04 MED ORDER — CEFAZOLIN SODIUM-DEXTROSE 1-4 GM/50ML-% IV SOLN
INTRAVENOUS | Status: AC
  Filled 2017-08-04: qty 50

## 2017-08-04 MED ORDER — SODIUM CHLORIDE 0.9 % IV SOLN: INTRAVENOUS | Status: DC

## 2017-08-04 MED ORDER — VANCOMYCIN HCL IN DEXTROSE 750-5 MG/150ML-% IV SOLN
750.0000 mg | INTRAVENOUS | Status: DC
  Filled 2017-08-04 (×2): qty 150

## 2017-08-04 SURGICAL SUPPLY — 1 items: TRAY LACERAT/PLASTIC (MISCELLANEOUS) ×2 IMPLANT

## 2017-08-04 NOTE — Op Note (Signed)
Operative Note     Preoperative diagnosis:   1. ESRD with functional permanent access and possible PermCath infecton  Postoperative diagnosis:  1. ESRD with functional permanent access and possible PermCath infection  Procedure:  Removal of Right jugular Permcath  Surgeon:  Leotis Pain, MD  Anesthesia:  Local  EBL:  Minimal  Indication for the Procedure:  The patient has a functional permanent dialysis access and no longer needs their permcath.  In addition, he is having fevers with dialysis and there is suspicion for permcath infection. This can be removed.  Risks and benefits are discussed and informed consent is obtained.  Description of the Procedure:  The patient's right neck, chest and existing catheter were sterilely prepped and draped. The area around the catheter was anesthetized copiously with 1% lidocaine. The catheter was dissected out with curved hemostats until the cuff was freed from the surrounding fibrous sheath. The fiber sheath was transected, and the catheter was then removed in its entirety using gentle traction. Pressure was held and sterile dressings were placed. The patient tolerated the procedure well and was taken to the recovery room in stable condition.     Leotis Pain  08/04/2017, 4:07 PM This note was created with Dragon Medical transcription system. Any errors in dictation are purely unintentional.

## 2017-08-04 NOTE — Progress Notes (Signed)
[facility]  ROUNDING NOTE   Subjective:   History taken with assistance of interpreter. Daughter at bedside who speak English  Martin Gilbert admitted to [facility] on 08/02/2017 for Sepsis, due to unspecified organism [facility]) [A41.9]   Patient recently returned from a one month trip to [place]. Returned on Monday and got dialysis at [facility] three days this week. He started to have fevers and chills on the last 30 minutes of hemodialysis Thursday  He went home where he was having more fevers and chills and brought to [facility]. Blood cultures and urine cultures with no growth.   Feels well today No c/o SOB,  Objective:  Vital signs in last 24 hours:  Temp:  [98.2 F (36.8 C)] 98.2 F (36.8 C) (08/20 0449) Pulse Rate:  [52] 52 (08/20 0449) Resp:  [17-19] 17 (08/20 0449) BP: (116-148)/(40-52) 148/52 (08/20 0449) SpO2:  [95 %-97 %] 95 % (08/20 0449)  Weight change:  Filed Weights   08/02/17 1506  Weight: 72 kg (158 lb 11.7 oz)    Intake/Output: I/O last 3 completed shifts: In: 63 [P.O.:10; IV Piggyback:5] Out: 750 [Urine:750]   Intake/Output this shift:  Total I/O In: 44 [IV Piggyback:44] Out: 75 [Urine:75]  Physical Exam: General: NAD, laying in bed  Head: Normocephalic, atraumatic. Moist oral mucosal membranes  Eyes: Anicteric,   Neck: Supple,   Lungs:  Clear to auscultation  Heart: Regular rate and rhythm  Abdomen:  Soft, nontender,   Extremities: no peripheral edema.  Neurologic: Nonfocal, moving all four extremities  Skin: No lesions  Access: RIJ permcath, left forearm AVF - maturing    Basic Metabolic Panel:  Recent Labs Lab 08/02/17 1508 08/03/17 0521  NA 136 136  K 2.9* 3.8  CL 99* 104  CO2 25 25  GLUCOSE 162* 87  BUN 23* 37*  CREATININE 3.45* 4.19*  CALCIUM 8.4* 7.9*    Liver Function Tests:  Recent Labs Lab 08/02/17 1508  AST 54*  ALT 39  ALKPHOS 406*  BILITOT 1.4*  PROT 7.5  ALBUMIN 3.3*   No results for input(s):  LIPASE, AMYLASE in the last 168 hours. No results for input(s): AMMONIA in the last 168 hours.  CBC:  Recent Labs Lab 08/02/17 1508 08/03/17 0521 08/04/17 0914  WBC 25.1* 18.5* 8.2  NEUTROABS 23.6*  --   --   HGB 12.2* 10.4* 11.1*  HCT 36.2* 30.8* 32.9*  MCV 90.3 92.5 91.6  PLT 205 173 215    Cardiac Enzymes:  Recent Labs Lab 08/02/17 1508  TROPONINI 0.09*    BNP: Invalid input(s): POCBNP  CBG:  Recent Labs Lab 08/03/17 1143 08/03/17 1705 08/03/17 2107 08/04/17 0737 08/04/17 1156  GLUCAP 147* 146* 158* 96 112*    Microbiology: Results for orders placed or performed during the hospital encounter of 08/02/17  Urine culture     Status: Abnormal   Collection Time: 08/02/17  2:45 AM  Result Value Ref Range Status   Specimen Description URINE, RANDOM  Final   Special Requests NONE  Final   Culture MULTIPLE SPECIES PRESENT, SUGGEST RECOLLECTION (A)  Final   Report Status 08/04/2017 FINAL  Final  Blood culture (routine x 2)     Status: None (Preliminary result)   Collection Time: 08/02/17  4:15 PM  Result Value Ref Range Status   Specimen Description BLOOD BLRA  Final   Special Requests   Final    BOTTLES DRAWN AEROBIC AND ANAEROBIC Blood Culture adequate volume   Culture NO GROWTH  2 DAYS  Final   Report Status PENDING  Incomplete  Blood culture (routine x 2)     Status: None (Preliminary result)   Collection Time: 08/02/17  4:20 PM  Result Value Ref Range Status   Specimen Description BLOOD LAC  Final   Special Requests   Final    BOTTLES DRAWN AEROBIC AND ANAEROBIC Blood Culture adequate volume   Culture NO GROWTH 2 DAYS  Final   Report Status PENDING  Incomplete  MRSA PCR Screening     Status: Abnormal   Collection Time: 08/02/17  8:36 PM  Result Value Ref Range Status   MRSA by PCR POSITIVE (A) NEGATIVE Final    Comment:        The GeneXpert MRSA Assay (FDA approved for NASAL specimens only), is one component of a comprehensive MRSA  colonization surveillance program. It is not intended to diagnose MRSA infection nor to guide or monitor treatment for MRSA infections. RESULT CALLED TO, READ BACK BY AND VERIFIED WITH: STACEY CLAY AT 2304 08/02/17.PMH     Coagulation Studies: No results for input(s): LABPROT, INR in the last 72 hours.  Urinalysis:  Recent Labs  08/02/17 0245  COLORURINE YELLOW*  LABSPEC 1.010  PHURINE 6.0  GLUCOSEU 50*  HGBUR SMALL*  BILIRUBINUR NEGATIVE  KETONESUR NEGATIVE  PROTEINUR 100*  NITRITE NEGATIVE  LEUKOCYTESUR LARGE*      Imaging: Dg Chest 1 View  Result Date: 08/02/2017 CLINICAL DATA:  Fever.  Renal failure EXAM: CHEST 1 VIEW COMPARISON:  March 28, 2017 FINDINGS: Central catheter tip is at cavoatrial junction. No pneumothorax. There is no appreciable edema or consolidation. Heart size and pulmonary vascularity are normal. No adenopathy. No bone lesions. IMPRESSION: Central catheter tip at cavoatrial junction. No pneumothorax. No edema or consolidation. Cardiac silhouette within normal limits. Electronically Signed   By: Lowella Grip III M.D.   On: 08/02/2017 16:08   Mr Cervical Spine Wo Contrast  Result Date: 08/03/2017 CLINICAL DATA:  Fever, neck pain EXAM: MRI CERVICAL SPINE WITHOUT CONTRAST TECHNIQUE: Multiplanar, multisequence MR imaging of the cervical spine was performed. No intravenous contrast was administered. COMPARISON:  None. FINDINGS: Alignment: Physiologic. Vertebrae: No fracture, evidence of discitis, or bone lesion. Cord: Normal signal and morphology. Posterior Fossa, vertebral arteries, paraspinal tissues: Posterior fossa demonstrates no focal abnormality. Vertebral artery flow voids are maintained. Paraspinal soft tissues are unremarkable. Disc levels: Discs: Disc spaces are maintained. C2-3: No significant disc bulge. No neural foraminal stenosis. No central canal stenosis. C3-4: Mild broad-based disc bulge. Mild left foraminal stenosis. No neural foraminal  stenosis. No central canal stenosis. C4-5: Broad-based disc bulge with a small central disc protrusion. No neural foraminal stenosis. Mild spinal stenosis. C5-6: Broad central disc protrusion flattening the ventral cervical spinal cord. Moderate spinal stenosis. Mild bilateral foraminal stenosis. C6-7: Mild broad-based disc bulge. No neural foraminal stenosis. No central canal stenosis. C7-T1: No significant disc bulge. No neural foraminal stenosis. No central canal stenosis. IMPRESSION: 1. At C5-6 there is a broad central disc protrusion flattening the ventral cervical spinal cord. Moderate spinal stenosis. Mild bilateral foraminal stenosis. 2. At C4-5 there is a broad-based disc bulge with a small central disc protrusion. Mild spinal stenosis. Electronically Signed   By: Kathreen Devoid   On: 08/03/2017 13:13     Medications:   . piperacillin-tazobactam (ZOSYN)  IV Stopped (08/04/17 0932)  . [START ON 08/05/2017] vancomycin     . allopurinol  100 mg Oral Daily  . aspirin EC  81 mg  Oral Daily  . atorvastatin  20 mg Oral Daily  . Chlorhexidine Gluconate Cloth  6 each Topical Q0600  . feeding supplement (GLUCERNA SHAKE)  237 mL Oral TID BM  . heparin  5,000 Units Subcutaneous Q8H  . insulin aspart  0-5 Units Subcutaneous QHS  . insulin aspart  0-9 Units Subcutaneous TID WC  . Melatonin  5 mg Oral QHS  . mupirocin ointment  1 application Nasal BID  . pantoprazole  40 mg Oral Daily  . tamsulosin  0.4 mg Oral QPC supper   acetaminophen **OR** acetaminophen, HYDROcodone-acetaminophen, ondansetron **OR** ondansetron (ZOFRAN) IV  Assessment/ Plan:  Martin Gilbert is a 76 y.o. Hispanic male with end stage renal disease on hemodialysis, diabetes mellitus type II, hypertension, hepatic cirrhosis, BPH, gout, diabetic peripheral neuropathy  TTS [facility] RIJ permcath  1. End Stage Renal Disease:   - Continue TTS schedule Complication with dialysis device: Concern for blood stream  infection. Cultures are negative but with symptoms and recent international travel. Permcath to be removed today - cathter free for a few days.  - plan for permcath Wed or Thursday  2. Hypertension: hypotensive on admission. Holding anti HTN meds   3. Diabetes mellitus type II with chronic kidney disease: non-insulin dependent. Hemoglobin A1c 6.2% on 8/16. - continue glucose control.   4. Anemia of chronic kidney disease: hemoglobin 11.1 - EPO as outpatient.   5. Secondary Hyperparathyroidism: outpatient PTH low at 142. Calcium and phosphorus at goal.  - Not currently on phos binders.    LOS: 2 Martin Gilbert 8/20/20182:12 PM

## 2017-08-04 NOTE — Plan of Care (Signed)
Problem: Nutrition: Goal: Adequate nutrition will be maintained Outcome: Not Progressing Pt is NPO   

## 2017-08-04 NOTE — Progress Notes (Signed)
[place] [facility] [place] NAME: Martin Gilbert    MR#:  [id number]  DATE OF BIRTH:  [birth year]  SUBJECTIVE:  Via interpreter.  Patient feels a lot better today. No fever. Eating well. Denies any pain or discharge at the perm catheter site. No cough. Makes very little urine.  REVIEW OF SYSTEMS:   Review of Systems  Constitutional: Negative for chills, fever and weight loss.  HENT: Negative for ear discharge, ear pain and nosebleeds.   Eyes: Negative for blurred vision, pain and discharge.  Respiratory: Negative for sputum production, shortness of breath, wheezing and stridor.   Cardiovascular: Negative for chest pain, palpitations, orthopnea and PND.  Gastrointestinal: Negative for abdominal pain, diarrhea, nausea and vomiting.  Genitourinary: Negative for frequency and urgency.  Musculoskeletal: Negative for back pain and joint pain.  Neurological: Negative for sensory change, speech change, focal weakness and weakness.  Psychiatric/Behavioral: Negative for depression and hallucinations. The patient is not nervous/anxious.    Tolerating Diet:yes Tolerating PT: pending  DRUG ALLERGIES:   Allergies  Allergen Reactions  . Aripiprazole     Denial of this listed allergy.    VITALS:  Blood pressure (!) 148/52, pulse (!) 52, temperature 98.2 F (36.8 C), temperature source Oral, resp. rate 17, height 5\' 7"  (1.702 m), weight 72 kg (158 lb 11.7 oz), SpO2 95 %.  PHYSICAL EXAMINATION:   Physical Exam  GENERAL:  76 y.o.-year-old patient lying in the bed with no acute distress.  EYES: Pupils equal, round, reactive to light and accommodation. No scleral icterus. Extraocular muscles intact.  HEENT: Head atraumatic, normocephalic. Oropharynx and nasopharynx clear.  NECK:  Supple, no jugular venous distention. No thyroid enlargement, no tenderness.  LUNGS: Normal breath sounds bilaterally, no wheezing, rales, rhonchi. No use of  accessory muscles of respiration. I looks normal. No cellulitis. No pus discharge. CARDIOVASCULAR: S1, S2 normal. No murmurs, rubs, or gallops.  ABDOMEN: Soft, nontender, nondistended. Bowel sounds present. No organomegaly or mass.  EXTREMITIES: No cyanosis, clubbing or edema b/l.    NEUROLOGIC: Cranial nerves II through XII are intact. No focal Motor or sensory deficits b/l.   PSYCHIATRIC:  patient is alert and oriented x 3.  SKIN: No obvious rash, lesion, or ulcer.   LABORATORY PANEL:  CBC  Recent Labs Lab 08/03/17 0521  WBC 18.5*  HGB 10.4*  HCT 30.8*  PLT 173    Chemistries   Recent Labs Lab 08/02/17 1508 08/03/17 0521  NA 136 136  K 2.9* 3.8  CL 99* 104  CO2 25 25  GLUCOSE 162* 87  BUN 23* 37*  CREATININE 3.45* 4.19*  CALCIUM 8.4* 7.9*  AST 54*  --   ALT 39  --   ALKPHOS 406*  --   BILITOT 1.4*  --    Cardiac Enzymes  Recent Labs Lab 08/02/17 1508  TROPONINI 0.09*   RADIOLOGY:  Dg Chest 1 View  Result Date: 08/02/2017 CLINICAL DATA:  Fever.  Renal failure EXAM: CHEST 1 VIEW COMPARISON:  March 28, 2017 FINDINGS: Central catheter tip is at cavoatrial junction. No pneumothorax. There is no appreciable edema or consolidation. Heart size and pulmonary vascularity are normal. No adenopathy. No bone lesions. IMPRESSION: Central catheter tip at cavoatrial junction. No pneumothorax. No edema or consolidation. Cardiac silhouette within normal limits. Electronically Signed   By: Lowella Grip III M.D.   On: 08/02/2017 16:08   Mr Cervical Spine Wo Contrast  Result Date: 08/03/2017 CLINICAL DATA:  Fever, neck  pain EXAM: MRI CERVICAL SPINE WITHOUT CONTRAST TECHNIQUE: Multiplanar, multisequence MR imaging of the cervical spine was performed. No intravenous contrast was administered. COMPARISON:  None. FINDINGS: Alignment: Physiologic. Vertebrae: No fracture, evidence of discitis, or bone lesion. Cord: Normal signal and morphology. Posterior Fossa, vertebral arteries,  paraspinal tissues: Posterior fossa demonstrates no focal abnormality. Vertebral artery flow voids are maintained. Paraspinal soft tissues are unremarkable. Disc levels: Discs: Disc spaces are maintained. C2-3: No significant disc bulge. No neural foraminal stenosis. No central canal stenosis. C3-4: Mild broad-based disc bulge. Mild left foraminal stenosis. No neural foraminal stenosis. No central canal stenosis. C4-5: Broad-based disc bulge with a small central disc protrusion. No neural foraminal stenosis. Mild spinal stenosis. C5-6: Broad central disc protrusion flattening the ventral cervical spinal cord. Moderate spinal stenosis. Mild bilateral foraminal stenosis. C6-7: Mild broad-based disc bulge. No neural foraminal stenosis. No central canal stenosis. C7-T1: No significant disc bulge. No neural foraminal stenosis. No central canal stenosis. IMPRESSION: 1. At C5-6 there is a broad central disc protrusion flattening the ventral cervical spinal cord. Moderate spinal stenosis. Mild bilateral foraminal stenosis. 2. At C4-5 there is a broad-based disc bulge with a small central disc protrusion. Mild spinal stenosis. Electronically Signed   By: Kathreen Devoid   On: 08/03/2017 13:13   ASSESSMENT AND PLAN:  Martin Gilbert  is a 76 y.o. male with a known history of End-stage renal disease. He presents with fever today and cold chills. He's also had some low blood pressure which is not usual for him. He had a full dialysis session today. He's been feeling very weak and no appetite. He's had an occasional headache. He has a little neck pain  1. Clinical sepsis with fever, relative hypotension and leukocytosis. -empirically on IV vancomycin and Zosyn -No fever -Source not identified, PermCath site looks okay - blood cultures negative so far. Pt to get PErm cath removed per nephrology. Spoke with Dr Lucky Cowboy.  2. Hypokalemia. Replace one dose of potassium orally  3. BPH on Flomax  4. Type 2 diabetes  mellitus. Place on sliding scale  5. End-stage renal disease on dialysis had full dialysis today. Consulted nephrology  Case discussed with Care Management/Social Worker. Management plans discussed with the patient, family and they are in agreement.  CODE STATUS: full  DVT Prophylaxis: heparin  TOTAL TIME TAKING CARE OF THIS PATIENT: 30 minutes.  >50% time spent on counselling and coordination of care  POSSIBLE D/C IN few DAYS, DEPENDING ON CLINICAL CONDITION.  Note: This dictation was prepared with Dragon dictation along with smaller phrase technology. Any transcriptional errors that result from this process are unintentional.  Valina Maes M.D on 08/04/2017 at 9:10 AM  Between 7am to 6pm - Pager - [phone]  After 6pm go to [email or web address] - password [email or web address] [facility]  Office  [phone]  CC: Primary care physician; [name], MD

## 2017-08-04 NOTE — Consult Note (Signed)
Pharmacy Antibiotic Note  Martin Gilbert is a 76 y.o. male admitted on 08/02/2017 with sepsis.  Pharmacy has been consulted for vancomycin/zosyn dosing. Pt admitted with sepsis on unknown source, possibly catheter related. Pt is an ESRD pt on HD TTSat  Plan:  Vancomycin 750mg  q HD session Trough prior to the 3rd HD session goal 15-25 Zosyn 3.375g q 12 hr EI   Height: 5\' 7"  (170.2 cm) Weight: 158 lb (71.7 kg) IBW/kg (Calculated) : 66.1  Temp (24hrs), Avg:98 F (36.7 C), Min:97.8 F (36.6 C), Max:98.2 F (36.8 C)   Recent Labs Lab 08/02/17 1508 08/02/17 1533 08/02/17 1826 08/03/17 0521 08/04/17 0914  WBC 25.1*  --   --  18.5* 8.2  CREATININE 3.45*  --   --  4.19*  --   LATICACIDVEN  --  2.0* 1.7  --   --     Estimated Creatinine Clearance: 13.8 mL/min (A) (by C-G formula based on SCr of 4.19 mg/dL (H)).    Allergies  Allergen Reactions  . Aripiprazole     Denial of this listed allergy.    Antimicrobials this admission: vancomycin 8/18 >>  zosyn 8/18 >>   Microbiology results: 8/18 BCx>>NG 2 days  8/18 UA: multiple species-suggest recollection    Thank you for allowing pharmacy to be a part of this patient's care.  [place] Resident 08/04/2017 4:34 PM

## 2017-08-04 NOTE — Accreditation Note (Signed)
Per record patient's daughter [name], [phone]. Hemodialysis patient; [name] with [employer] notified for discharge planning. RNCM will continue to follow.

## 2017-08-04 NOTE — Progress Notes (Signed)
Initial Nutrition Assessment  DOCUMENTATION CODES:   Non-severe (moderate) malnutrition in context of acute illness/injury  INTERVENTION:  1. Continue Glucerna Shake po TID, each supplement provides 220 kcal and 10 grams of protein  NUTRITION DIAGNOSIS:   Malnutrition related to acute illness as evidenced by moderate depletion of body fat, moderate depletions of muscle mass, energy intake < 75% for > 7 days.  GOAL:   Patient will meet greater than or equal to 90% of their needs  MONITOR:   PO intake, I & O's, Labs, Supplement acceptance, Weight trends  REASON FOR ASSESSMENT:   Malnutrition Screening Tool    ASSESSMENT:   Martin Gilbert is a 76 yo male with PMHHx ESRD on HD, Anemia, GERD, HTN, HLD presents with fever, chills, hypotension  Spoke with patient via interpreter, family at bedside. They just came back from a month-long trip to [place], for the last 2 weeks patient has not been eating much, had poor appetite. Ate well yesterday 85-100%, currently NPO today, patient complains he is hungry. WBC 8.2 today. Seems to be feeling much better.  Reports a dry weight of 167# currently 158# weight loss insignificant for time frame. No issues chewing/swallowing/choking Drinking glucerna shakes.  663mL UOP yesterday  Nutrition-Focused physical exam completed. Findings are moderate fat depletion, moderate-severe muscle depletion, and no edema.   Labs reviewed:  AST 54, TBili 1.4, HGB/HCT 9.5/28.5 Medications reviewed and include:  Novolog 0-9 Units TID, 0-5 Units HS  Diet Order:  Diet NPO time specified  Skin:  Reviewed, no issues  Last BM:  08/03/2017  Height:   Ht Readings from Last 1 Encounters:  08/03/17 5\' 7"  (1.702 m)    Weight:   Wt Readings from Last 1 Encounters:  08/02/17 158 lb 11.7 oz (72 kg)    Ideal Body Weight:  67.27 kg  BMI:  Body mass index is 26.41 kg/m.  Estimated Nutritional Needs:   Kcal:  2150-2500 calories  (30-35cal/kg)  Protein:  86-108 grams (1.2-1.5g/kg)  Fluid:  UOP + 1L  EDUCATION NEEDS:   No education needs identified at this time  Satira Anis. Luceil Herrin, MS, RD LDN Inpatient Clinical Dietitian Pager [phone]

## 2017-08-04 NOTE — Consult Note (Signed)
[facility] V[facility] Consult Note  MRN : [id number]  Martin Gilbert is a 76 y.o. (06-20-1941) male who presents with chief complaint of  Chief Complaint  Patient presents with  . Weakness  . Fever  .  History of Present Illness: I am asked by Dr. Candiss Norse to see the patient in regards to a Permcath infection. The patient has been febrile with dialysis since last week.  He has not had fever between treatment.  He is lethargic and has less energy as well.  He has no chest pair of shortness of breath.  He has not missed HD.  He had an AVF placed about 7-8 weeks ago but this has not yet been used for HD.    Current Facility-Administered Medications  Medication Dose Route Frequency Provider Last Rate Last Dose  . acetaminophen (TYLENOL) tablet 650 mg  650 mg Oral Q6H PRN Loletha Grayer, MD       Or  . acetaminophen (TYLENOL) suppository 650 mg  650 mg Rectal Q6H PRN Wieting, Richard, MD      . allopurinol (ZYLOPRIM) tablet 100 mg  100 mg Oral Daily Loletha Grayer, MD   100 mg at 08/04/17 1000  . aspirin EC tablet 81 mg  81 mg Oral Daily Loletha Grayer, MD   81 mg at 08/04/17 1000  . atorvastatin (LIPITOR) tablet 20 mg  20 mg Oral Daily Loletha Grayer, MD   20 mg at 08/04/17 1000  . ceFAZolin (ANCEF) 1-4 GM/50ML-% IVPB           . Chlorhexidine Gluconate Cloth 2 % PADS 6 each  6 each Topical Q0600 Loletha Grayer, MD   6 each at 08/03/17 0600  . feeding supplement (GLUCERNA SHAKE) (GLUCERNA SHAKE) liquid 237 mL  237 mL Oral TID BM Loletha Grayer, MD   Stopped at 08/04/17 1000  . heparin injection 5,000 Units  5,000 Units Subcutaneous Q8H Loletha Grayer, MD   5,000 Units at 08/04/17 0532  . HYDROcodone-acetaminophen (NORCO/VICODIN) 5-325 MG per tablet 1 tablet  1 tablet Oral Q6H PRN Wieting, Richard, MD      . insulin aspart (novoLOG) injection 0-5 Units  0-5 Units Subcutaneous QHS Wieting, Richard, MD      . insulin aspart (novoLOG) injection 0-9 Units   0-9 Units Subcutaneous TID WC Loletha Grayer, MD   1 Units at 08/03/17 1730  . Melatonin TABS 5 mg  5 mg Oral QHS Loletha Grayer, MD   5 mg at 08/03/17 2238  . mupirocin ointment (BACTROBAN) 2 % 1 application  1 application Nasal BID Loletha Grayer, MD   1 application at 17/40/81 [phone]  . ondansetron (ZOFRAN) tablet 4 mg  4 mg Oral Q6H PRN Loletha Grayer, MD       Or  . ondansetron (ZOFRAN) injection 4 mg  4 mg Intravenous Q6H PRN Wieting, Richard, MD      . pantoprazole (PROTONIX) EC tablet 40 mg  40 mg Oral Daily Loletha Grayer, MD   40 mg at 08/04/17 1000  . piperacillin-tazobactam (ZOSYN) IVPB 3.375 g  3.375 g Intravenous Q12H Marcelle Overlie D, RPH 12.5 mL/hr at 08/04/17 1608 3.375 g at 08/04/17 1608  . tamsulosin (FLOMAX) capsule 0.4 mg  0.4 mg Oral QPC supper Loletha Grayer, MD   0.4 mg at 08/03/17 1730  . [START ON 08/05/2017] vancomycin (VANCOCIN) IVPB 750 mg/150 ml premix  750 mg Intravenous Q T,Th,Sa-HD Candelaria Stagers, [facility]        Past Medical History:  Diagnosis  Date  . Anemia   . Back pain   . BPH (benign prostatic hyperplasia)   . CKD (chronic kidney disease), stage IV ([place])   . Clostridium difficile colitis   . Diabetes mellitus without complication ([place])   . GERD (gastroesophageal reflux disease)   . HTN (hypertension)   . Hyperlipidemia     Past Surgical History:  Procedure Laterality Date  . AV FISTULA PLACEMENT Left 06/13/2017   Procedure: ARTERIOVENOUS (AV) FISTULA CREATION ( RADIAL CEPHALIC );  Surgeon: Katha Cabal, MD;  Location: [facility] ORS;  Service: Vascular;  Laterality: Left;  . CATARACT EXTRACTION     one eye not sure which  . DIALYSIS/PERMA CATHETER INSERTION N/A 04/03/2017   Procedure: Dialysis/Perma Catheter Insertion;  Surgeon: Algernon Huxley, MD;  Location: [facility];  Service: Cardiovascular;  Laterality: N/A;  . hernia repair      Social History Social History  Substance Use Topics  . Smoking status: Never Smoker  .  Smokeless tobacco: Never Used  . Alcohol use No     Comment: Drank in the past, but has stopped for several years.     Family History Family History  Problem Relation Age of Onset  . Hypertension Mother   . Diabetes Neg Hx   No bleeding or clotting disorders, no aneurysms  Allergies  Allergen Reactions  . Aripiprazole     Denial of this listed allergy.     REVIEW OF SYSTEMS (Negative unless checked)  Constitutional: [] Weight loss  [x] Fever  [] Chills Cardiac: [] Chest pain   [] Chest pressure   [] Palpitations   [] Shortness of breath when laying flat   [] Shortness of breath at rest   [x] Shortness of breath with exertion. Vascular:  [] Pain in legs with walking   [] Pain in legs at rest   [] Pain in legs when laying flat   [] Claudication   [] Pain in feet when walking  [] Pain in feet at rest  [] Pain in feet when laying flat   [] History of DVT   [] Phlebitis   [] Swelling in legs   [] Varicose veins   [] Non-healing ulcers Pulmonary:   [] Uses home oxygen   [] Productive cough   [] Hemoptysis   [] Wheeze  [] COPD   [] Asthma Neurologic:  [] Dizziness  [] Blackouts   [] Seizures   [] History of stroke   [] History of TIA  [] Aphasia   [] Temporary blindness   [] Dysphagia   [] Weakness or numbness in arms   [] Weakness or numbness in legs Musculoskeletal:  [x] Arthritis   [] Joint swelling   [] Joint pain   [] Low back pain Hematologic:  [] Easy bruising  [] Easy bleeding   [] Hypercoagulable state   [x] Anemic  [] Hepatitis Gastrointestinal:  [] Blood in stool   [] Vomiting blood  [] Gastroesophageal reflux/heartburn   [] Difficulty swallowing. Genitourinary:  [x] Chronic kidney disease   [] Difficult urination  [] Frequent urination  [] Burning with urination   [] Blood in urine Skin:  [] Rashes   [] Ulcers   [] Wounds Psychological:  [] History of anxiety   []  History of major depression.  Physical Examination  Vitals:   08/04/17 0449 08/04/17 1418 08/04/17 1550 08/04/17 1602  BP: (!) 148/52  (!) 135/45 (!) 117/36  Pulse: (!)  52 71 61 62  Resp: 17 16  17   Temp: 98.2 F (36.8 C) 97.8 F (36.6 C)  97.9 F (36.6 C)  TempSrc: Oral Oral    SpO2: 95% 97% 96% 98%  Weight:  71.7 kg (158 lb)    Height:  5\' 7"  (1.702 m)     Body mass  index is 24.75 kg/m. Gen:  WD/WN, NAD Head: [place]/AT, No temporalis wasting.  Ear/Nose/Throat: Hearing grossly intact, nares w/o erythema or drainage, oropharynx w/o Erythema/Exudate Eyes: Sclera non-icteric, conjunctiva clear Neck: Trachea midline.  No JVD.  Pulmonary:  Good air movement, respirations not labored, equal bilaterally.  Cardiac: RRR, normal S1, S2. Vascular: Nice thrill in left forearm AVF. Permcath in right chest without erythema or drainage. Vessel Right Left  Radial Palpable Palpable                                    Musculoskeletal: M/S 5/5 throughout.  Extremities without ischemic changes.  No deformity or atrophy. No edema. Neurologic: Sensation grossly intact in extremities.  Symmetrical.  Speech is fluent. Motor exam as listed above. Psychiatric: Judgment intact, Mood & affect appropriate for pt's clinical situation. Dermatologic: No rashes or ulcers noted.  No cellulitis or open wounds.       CBC Lab Results  Component Value Date   WBC 8.2 08/04/2017   HGB 11.1 (L) 08/04/2017   HCT 32.9 (L) 08/04/2017   MCV 91.6 08/04/2017   PLT 215 08/04/2017    BMET    Component Value Date/Time   NA 136 08/03/2017 0521   NA 138 04/08/2015 2133   K 3.8 08/03/2017 0521   K 4.0 04/08/2015 2133   CL 104 08/03/2017 0521   CL 108 04/08/2015 2133   CO2 25 08/03/2017 0521   CO2 25 04/08/2015 2133   GLUCOSE 87 08/03/2017 0521   GLUCOSE 181 (H) 04/08/2015 2133   BUN 37 (H) 08/03/2017 0521   BUN 60 (H) 04/08/2015 2133   CREATININE 4.19 (H) 08/03/2017 0521   CREATININE 4.04 (H) 04/08/2015 2133   CALCIUM 7.9 (L) 08/03/2017 0521   CALCIUM 8.5 (L) 04/08/2015 2133   GFRNONAA 12 (L) 08/03/2017 0521   GFRNONAA 14 (L) 04/08/2015 2133   GFRAA 14 (L)  08/03/2017 0521   GFRAA 16 (L) 04/08/2015 2133   Estimated Creatinine Clearance: 13.8 mL/min (A) (by C-G formula based on SCr of 4.19 mg/dL (H)).  COAG Lab Results  Component Value Date   INR 1.05 06/04/2017   INR 1.35 03/27/2017   INR 1.0 12/28/2012    Radiology Dg Chest 1 View  Result Date: 08/02/2017 CLINICAL DATA:  Fever.  Renal failure EXAM: CHEST 1 VIEW COMPARISON:  March 28, 2017 FINDINGS: Central catheter tip is at cavoatrial junction. No pneumothorax. There is no appreciable edema or consolidation. Heart size and pulmonary vascularity are normal. No adenopathy. No bone lesions. IMPRESSION: Central catheter tip at cavoatrial junction. No pneumothorax. No edema or consolidation. Cardiac silhouette within normal limits. Electronically Signed   By: Lowella Grip III M.D.   On: 08/02/2017 16:08   Mr Cervical Spine Wo Contrast  Result Date: 08/03/2017 CLINICAL DATA:  Fever, neck pain EXAM: MRI CERVICAL SPINE WITHOUT CONTRAST TECHNIQUE: Multiplanar, multisequence MR imaging of the cervical spine was performed. No intravenous contrast was administered. COMPARISON:  None. FINDINGS: Alignment: Physiologic. Vertebrae: No fracture, evidence of discitis, or bone lesion. Cord: Normal signal and morphology. Posterior Fossa, vertebral arteries, paraspinal tissues: Posterior fossa demonstrates no focal abnormality. Vertebral artery flow voids are maintained. Paraspinal soft tissues are unremarkable. Disc levels: Discs: Disc spaces are maintained. C2-3: No significant disc bulge. No neural foraminal stenosis. No central canal stenosis. C3-4: Mild broad-based disc bulge. Mild left foraminal stenosis. No neural foraminal stenosis. No central canal stenosis. C4-5:  Broad-based disc bulge with a small central disc protrusion. No neural foraminal stenosis. Mild spinal stenosis. C5-6: Broad central disc protrusion flattening the ventral cervical spinal cord. Moderate spinal stenosis. Mild bilateral foraminal  stenosis. C6-7: Mild broad-based disc bulge. No neural foraminal stenosis. No central canal stenosis. C7-T1: No significant disc bulge. No neural foraminal stenosis. No central canal stenosis. IMPRESSION: 1. At C5-6 there is a broad central disc protrusion flattening the ventral cervical spinal cord. Moderate spinal stenosis. Mild bilateral foraminal stenosis. 2. At C4-5 there is a broad-based disc bulge with a small central disc protrusion. Mild spinal stenosis. Electronically Signed   By: Kathreen Devoid   On: 08/03/2017 13:13      Assessment/Plan 1.  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  The PermCath should be removed at this point. I would recommend trying the AVF tomorrow and seeing if this is usable for dialysis.  If the AVF is not usable for dialysis, then we will place a new Permcath on Wednesday or Thursday.  Patient is informed of this with the use of an interpretor. 2. HTN. Stable on outpatient medications and blood pressure control important in reducing the progression of atherosclerotic disease. On appropriate oral medications. 3. DM. Stable on outpatient medications and blood glucose control important in reducing the progression of atherosclerotic disease. Also, involved in wound healing. On appropriate medications.    Leotis Pain, MD  08/04/2017 4:09 PM    This note was created with Dragon medical transcription system.  Any error is purely unintentional

## 2017-08-05 ENCOUNTER — Encounter: Payer: Self-pay | Admitting: Vascular Surgery

## 2017-08-05 LAB — GLUCOSE, CAPILLARY
GLUCOSE-CAPILLARY: 139 mg/dL — AB (ref 65–99)
Glucose-Capillary: 110 mg/dL — ABNORMAL HIGH (ref 65–99)
Glucose-Capillary: 158 mg/dL — ABNORMAL HIGH (ref 65–99)
Glucose-Capillary: 124 mg/dL — ABNORMAL HIGH (ref 65–99)

## 2017-08-05 MED ORDER — SODIUM CHLORIDE 0.9 % IV SOLN: INTRAVENOUS | Status: DC

## 2017-08-05 MED ORDER — DEXTROSE 5 % IV SOLN
1.5000 g | INTRAVENOUS | Status: DC
  Filled 2017-08-05 (×2): qty 1.5

## 2017-08-05 MED ORDER — HYDRALAZINE HCL 20 MG/ML IJ SOLN
10.0000 mg | Freq: Four times a day (QID) | INTRAMUSCULAR | Status: DC | PRN
  Administered 2017-08-05: 10 mg via INTRAVENOUS
  Filled 2017-08-05: qty 1

## 2017-08-05 NOTE — Care Management Note (Signed)
Case Management Note  Patient Details  Name: Martin Gilbert MRN: [id number] Date of Birth: Jun 17, 1941  Subjective/Objective:                  RNCM spoke with patient's daughter Martin Gilbert, [phone] to discuss discharge planning. Patient lives with his wife (that is blind and Non-English speaking) and a grandson.   Angie states she can provide patient with transportation to home tomorrow if he is medically ready however she was told today that "it would be Thursday before they would be able to put in a permacath due to cultures pending".  She states he has had home health before in the past but doesn't feel he would benefit from it this visit. She is hopeful if IV antibiotics is needed at discharge that this can be managed with dialysis.  He typically ambulates with a cane but has a walker if needed.   Action/Plan: RNCM will continue to follow.   Expected Discharge Date:                  Expected Discharge Plan:     In-House Referral:     Discharge planning Services     Post Acute Care Choice:    Choice offered to:  Adult Children  DME Arranged:    DME Agency:     HH Arranged:    HH Agency:     Status of Service:  Completed, signed off  If discussed at [place] of Stay Meetings, dates discussed:    Additional Comments:  Marshell Garfinkel, RN 08/05/2017, 12:48 PM

## 2017-08-05 NOTE — Progress Notes (Signed)
[facility]  ROUNDING NOTE   Subjective:   History taken with assistance of interpreter.   [name] admitted to [facility] on 08/02/2017 for Sepsis, due to unspecified organism [facility]) [A41.9]   Patient recently returned from a one month trip to [place]. Returned on Monday and got dialysis at [facility] three days this week. He started to have fevers and chills on the last 30 minutes of hemodialysis and was brought in for evaluation. Patient states he feels better today. He was able to eat breakfast. No nausea or vomiting. No SOB or LE edema   Objective:  Vital signs in last 24 hours:  Temp:  [97.6 F (36.4 C)-98.1 F (36.7 C)] 98.1 F (36.7 C) (08/21 0447) Pulse Rate:  [49-71] 49 (08/21 0447) Resp:  [16-18] 18 (08/21 0447) BP: (117-140)/(36-74) 140/42 (08/21 0447) SpO2:  [95 %-98 %] 97 % (08/21 0447) Weight:  [71.7 kg (158 lb)] 71.7 kg (158 lb) (08/20 1418)  Weight change:  Filed Weights   08/02/17 1506 08/04/17 1418  Weight: 72 kg (158 lb 11.7 oz) 71.7 kg (158 lb)    Intake/Output: I/O last 3 completed shifts: In: 111 [IV Piggyback:111] Out: 800 [Urine:800]   Intake/Output this shift:  Total I/O In: 40 [IV Piggyback:40] Out: -   Physical Exam: General: NAD, laying in bed  Head: Normocephalic, atraumatic. Moist oral mucosal membranes  Eyes: Anicteric,   Neck: Supple,   Lungs:  Clear to auscultation  Heart: Regular rate and rhythm  Abdomen:  Soft, nontender,   Extremities: no peripheral edema.  Neurologic: Nonfocal, moving all four extremities  Skin: No lesions  Access:  Left forearm AVF - maturing    Basic Metabolic Panel:  Recent Labs Lab 08/02/17 1508 08/03/17 0521  NA 136 136  K 2.9* 3.8  CL 99* 104  CO2 25 25  GLUCOSE 162* 87  BUN 23* 37*  CREATININE 3.45* 4.19*  CALCIUM 8.4* 7.9*    Liver Function Tests:  Recent Labs Lab 08/02/17 1508  AST 54*  ALT 39  ALKPHOS 406*  BILITOT 1.4*  PROT 7.5  ALBUMIN 3.3*   No  results for input(s): LIPASE, AMYLASE in the last 168 hours. No results for input(s): AMMONIA in the last 168 hours.  CBC:  Recent Labs Lab 08/02/17 1508 08/03/17 0521 08/04/17 0914  WBC 25.1* 18.5* 8.2  NEUTROABS 23.6*  --   --   HGB 12.2* 10.4* 11.1*  HCT 36.2* 30.8* 32.9*  MCV 90.3 92.5 91.6  PLT 205 173 215    Cardiac Enzymes:  Recent Labs Lab 08/02/17 1508  TROPONINI 0.09*    BNP: Invalid input(s): POCBNP  CBG:  Recent Labs Lab 08/04/17 1156 08/04/17 1434 08/04/17 1658 08/04/17 2104 08/05/17 0737  GLUCAP 112* 97 134* 149* 110*    Microbiology: Results for orders placed or performed during the hospital encounter of 08/02/17  Urine culture     Status: Abnormal   Collection Time: 08/02/17  2:45 AM  Result Value Ref Range Status   Specimen Description URINE, RANDOM  Final   Special Requests NONE  Final   Culture MULTIPLE SPECIES PRESENT, SUGGEST RECOLLECTION (A)  Final   Report Status 08/04/2017 FINAL  Final  Blood culture (routine x 2)     Status: None (Preliminary result)   Collection Time: 08/02/17  4:15 PM  Result Value Ref Range Status   Specimen Description BLOOD BLRA  Final   Special Requests   Final    BOTTLES DRAWN AEROBIC AND ANAEROBIC  Blood Culture adequate volume   Culture NO GROWTH 3 DAYS  Final   Report Status PENDING  Incomplete  Blood culture (routine x 2)     Status: None (Preliminary result)   Collection Time: 08/02/17  4:20 PM  Result Value Ref Range Status   Specimen Description BLOOD LAC  Final   Special Requests   Final    BOTTLES DRAWN AEROBIC AND ANAEROBIC Blood Culture adequate volume   Culture NO GROWTH 3 DAYS  Final   Report Status PENDING  Incomplete  MRSA PCR Screening     Status: Abnormal   Collection Time: 08/02/17  8:36 PM  Result Value Ref Range Status   MRSA by PCR POSITIVE (A) NEGATIVE Final    Comment:        The GeneXpert MRSA Assay (FDA approved for NASAL specimens only), is one component of  a comprehensive MRSA colonization surveillance program. It is not intended to diagnose MRSA infection nor to guide or monitor treatment for MRSA infections. RESULT CALLED TO, READ BACK BY AND VERIFIED WITH: STACEY CLAY AT 2304 08/02/17.PMH     Coagulation Studies: No results for input(s): LABPROT, INR in the last 72 hours.  Urinalysis: No results for input(s): COLORURINE, LABSPEC, PHURINE, GLUCOSEU, HGBUR, BILIRUBINUR, KETONESUR, PROTEINUR, UROBILINOGEN, NITRITE, LEUKOCYTESUR in the last 72 hours.  Invalid input(s): APPERANCEUR    Imaging: Mr Cervical Spine Wo Contrast  Result Date: 08/03/2017 CLINICAL DATA:  Fever, neck pain EXAM: MRI CERVICAL SPINE WITHOUT CONTRAST TECHNIQUE: Multiplanar, multisequence MR imaging of the cervical spine was performed. No intravenous contrast was administered. COMPARISON:  None. FINDINGS: Alignment: Physiologic. Vertebrae: No fracture, evidence of discitis, or bone lesion. Cord: Normal signal and morphology. Posterior Fossa, vertebral arteries, paraspinal tissues: Posterior fossa demonstrates no focal abnormality. Vertebral artery flow voids are maintained. Paraspinal soft tissues are unremarkable. Disc levels: Discs: Disc spaces are maintained. C2-3: No significant disc bulge. No neural foraminal stenosis. No central canal stenosis. C3-4: Mild broad-based disc bulge. Mild left foraminal stenosis. No neural foraminal stenosis. No central canal stenosis. C4-5: Broad-based disc bulge with a small central disc protrusion. No neural foraminal stenosis. Mild spinal stenosis. C5-6: Broad central disc protrusion flattening the ventral cervical spinal cord. Moderate spinal stenosis. Mild bilateral foraminal stenosis. C6-7: Mild broad-based disc bulge. No neural foraminal stenosis. No central canal stenosis. C7-T1: No significant disc bulge. No neural foraminal stenosis. No central canal stenosis. IMPRESSION: 1. At C5-6 there is a broad central disc protrusion  flattening the ventral cervical spinal cord. Moderate spinal stenosis. Mild bilateral foraminal stenosis. 2. At C4-5 there is a broad-based disc bulge with a small central disc protrusion. Mild spinal stenosis. Electronically Signed   By: Kathreen Devoid   On: 08/03/2017 13:13     Medications:   . piperacillin-tazobactam (ZOSYN)  IV Stopped (08/05/17 0847)  . vancomycin     . allopurinol  100 mg Oral Daily  . aspirin EC  81 mg Oral Daily  . atorvastatin  20 mg Oral Daily  . Chlorhexidine Gluconate Cloth  6 each Topical Q0600  . feeding supplement (GLUCERNA SHAKE)  237 mL Oral TID BM  . heparin  5,000 Units Subcutaneous Q8H  . insulin aspart  0-5 Units Subcutaneous QHS  . insulin aspart  0-9 Units Subcutaneous TID WC  . Melatonin  5 mg Oral QHS  . mupirocin ointment  1 application Nasal BID  . pantoprazole  40 mg Oral Daily  . tamsulosin  0.4 mg Oral QPC supper   acetaminophen **  OR** acetaminophen, HYDROcodone-acetaminophen, ondansetron **OR** ondansetron (ZOFRAN) IV  Assessment/ Plan:  [name] is a 76 y.o. Hispanic male with end stage renal disease on hemodialysis, diabetes mellitus type II, hypertension, hepatic cirrhosis, BPH, gout, diabetic peripheral neuropathy  TTS [facility] permcath  1. End Stage Renal Disease:   - Continue TTS schedule Complication with dialysis device:  Suspect cathter related infection. Blood cultures are negative; WBC count is now normal. - cathter free for a few days.  - Fistula is too small to stick. Encouraged patient to exercise. - plan for permcath Wed or Thu  2. Hypertension: hypotensive on admission. Holding anti HTN meds   3.  Secondary Hyperparathyroidism:   - Not currently on phos binders.  monitor  4. Anemia of chronic kidney disease: hemoglobin 11.1 - EPO as outpatient.     LOS: 3 Forrest Jaroszewski 8/21/201811:09 AM

## 2017-08-05 NOTE — Care Management (Addendum)
Concern for need for home IV ABX however with Hemodialysis patient's that can return to OP dialysis most of the time nephro allows antibiotics to be delivered to patient in dialysis center. [facility] notified of potential need. Estill Bamberg with [facility] updated on potential discharge tomorrow.

## 2017-08-05 NOTE — Progress Notes (Signed)
[facility] NAME: Martin Gilbert    MR#:  [id number]  DATE OF BIRTH:  1941/06/06  SUBJECTIVE:  Via interpreter.  Patient feels a lot better today. No fever. Eating well. Denies any pain or discharge at the perm catheter site. No cough. Makes very little urine.  REVIEW OF SYSTEMS:   Review of Systems  Constitutional: Negative for chills, fever and weight loss.  HENT: Negative for ear discharge, ear pain and nosebleeds.   Eyes: Negative for blurred vision, pain and discharge.  Respiratory: Negative for sputum production, shortness of breath, wheezing and stridor.   Cardiovascular: Negative for chest pain, palpitations, orthopnea and PND.  Gastrointestinal: Negative for abdominal pain, diarrhea, nausea and vomiting.  Genitourinary: Negative for frequency and urgency.  Musculoskeletal: Negative for back pain and joint pain.  Neurological: Negative for sensory change, speech change, focal weakness and weakness.  Psychiatric/Behavioral: Negative for depression and hallucinations. The patient is not nervous/anxious.    Tolerating Diet:yes Tolerating PT: HHPT  DRUG ALLERGIES:   Allergies  Allergen Reactions  . Aripiprazole     Denial of this listed allergy.    VITALS:  Blood pressure (!) 156/58, pulse (!) 52, temperature 98.1 F (36.7 C), temperature source Oral, resp. rate 18, height 5\' 7"  (1.702 m), weight 71.7 kg (158 lb), SpO2 98 %.  PHYSICAL EXAMINATION:   Physical Exam  GENERAL:  76 y.o.-year-old patient lying in the bed with no acute distress.  EYES: Pupils equal, round, reactive to light and accommodation. No scleral icterus. Extraocular muscles intact.  HEENT: Head atraumatic, normocephalic. Oropharynx and nasopharynx clear.  NECK:  Supple, no jugular venous distention. No thyroid enlargement, no tenderness.  LUNGS: Normal breath sounds bilaterally, no wheezing, rales, rhonchi. No use of accessory  muscles of respiration. I looks normal. No cellulitis. No pus discharge. CARDIOVASCULAR: S1, S2 normal. No murmurs, rubs, or gallops.  ABDOMEN: Soft, nontender, nondistended. Bowel sounds present. No organomegaly or mass.  EXTREMITIES: No cyanosis, clubbing or edema b/l.    NEUROLOGIC: Cranial nerves II through XII are intact. No focal Motor or sensory deficits b/l.   PSYCHIATRIC:  patient is alert and oriented x 3.  SKIN: No obvious rash, lesion, or ulcer.   LABORATORY PANEL:  CBC  Recent Labs Lab 08/04/17 0914  WBC 8.2  HGB 11.1*  HCT 32.9*  PLT 215    Chemistries   Recent Labs Lab 08/02/17 1508 08/03/17 0521  NA 136 136  K 2.9* 3.8  CL 99* 104  CO2 25 25  GLUCOSE 162* 87  BUN 23* 37*  CREATININE 3.45* 4.19*  CALCIUM 8.4* 7.9*  AST 54*  --   ALT 39  --   ALKPHOS 406*  --   BILITOT 1.4*  --    Cardiac Enzymes  Recent Labs Lab 08/02/17 1508  TROPONINI 0.09*   RADIOLOGY:  No results found. ASSESSMENT AND PLAN:  Martin Gilbert  is a 76 y.o. male with a known history of End-stage renal disease. He presents with fever today and cold chills. He's also had some low blood pressure which is not usual for him. He had a full dialysis session today. He's been feeling very weak and no appetite. He's had an occasional headache. He has a little neck pain  1. Clinical sepsis with fever, relative hypotension and leukocytosis. -empirically on IV vancomycin and Zosyn--Will change to vancomycin and cefepime at discharge which can be given at dialysis. -No fever -Source not identified,  PermCath site looks okay - blood cultures negative so far. Pt to get PErm cath removed per nephrology. Spoke with Dr Lucky Cowboy.  2. Hypokalemia. Replace one dose of potassium orally  3. BPH on Flomax  4. Type 2 diabetes mellitus. Place on sliding scale  5. End-stage renal disease on dialysis had full dialysis today. Consulted nephrology.  Case discussed with Care Management/Social  Worker. Management plans discussed with the patient, family and they are in agreement.  CODE STATUS: full  DVT Prophylaxis: heparin  TOTAL TIME TAKING CARE OF THIS PATIENT: 30 minutes.  >50% time spent on counselling and coordination of care  POSSIBLE D/C IN few DAYS, DEPENDING ON CLINICAL CONDITION.  Note: This dictation was prepared with Dragon dictation along with smaller phrase technology. Any transcriptional errors that result from this process are unintentional.  Jasir Rother M.D on 08/05/2017 at 2:21 PM  Between 7am to 6pm - Pager - [phone]  After 6pm go to [email or web address] - password [email or web address] [facility]  Office  [phone]  CC: Primary care physician; [name], MD

## 2017-08-05 NOTE — Evaluation (Signed)
Physical Therapy Evaluation Patient Details Name: Martin Gilbert MRN: [id number] DOB: January 14, 1941 Today's Date: 08/05/2017   History of Present Illness  Martin Gilbert  is a 76 y.o. male with a known history of End-stage renal disease. He presents with fever today and cold chills. He's also had some low blood pressure which is not usual for him. He had a full dialysis session today. He's been feeling very weak and no appetite. He's had an occasional headache. He has a little neck pain. He has trouble urinating. In the ER, he was found to have a lactic acidosis and an elevated white blood cell count. Urine analysis is still pending at this time. Hospitalist services contacted for further evaluation.  Clinical Impression  Pt admitted with above diagnosis. Pt currently with functional limitations due to the deficits listed below (see PT Problem List).  Pt is modified independent for bed mobility and supervision only for transfers. He is able to complete a lap around RN station with therapist but with decreased speed and mild fatigue. He reports more instability and deconditioning than his baseline. Pt serves as primary caregiver for wife. He would benefit from [facility] PT at discharge in order to return to pre-morbid state. He would like a cane as his walking stick is less stable. Pt encouraged to purchase after discharge or borrow from a friend. If hospital has one in donation this would also be an option. Pt will benefit from PT services to address deficits in strength, balance, and mobility in order to return to full function at home.     Follow Up Recommendations Home health PT    Equipment Recommendations  Cane;Other (comment) (Pt encouraged to purchase cane, only has a walking stick)    Recommendations for Other Services       Precautions / Restrictions Precautions Precautions: Fall Restrictions Weight Bearing Restrictions: No      Mobility  Bed Mobility Overal bed mobility:  Modified Independent             General bed mobility comments: Good speed/sequencing  Transfers Overall transfer level: Needs assistance Equipment used: Rolling walker (2 wheeled) Transfers: Sit to/from Stand Sit to Stand: Supervision         General transfer comment: Pt demonstrates fair speed/sequencing with rolling walker  Ambulation/Gait Ambulation/Gait assistance: Min guard Ambulation Distance (Feet): 220 Feet Assistive device: Rolling walker (2 wheeled) Gait Pattern/deviations: Decreased step length - right;Decreased step length - left Gait velocity: Decreased Gait velocity interpretation: <1.8 ft/sec, indicative of risk for recurrent falls General Gait Details: Pt able to ambulate a full lap around RN station with therapist. Gait speed is decreased and rolling walker utilized. Pt reports mild fatigue following ambulation and slowing of speed compared to his baseline  Science writer    Modified Rankin (Stroke Patients Only)       Balance Overall balance assessment: Needs assistance Sitting-balance support: No upper extremity supported Sitting balance-Leahy Scale: Good     Standing balance support: No upper extremity supported Standing balance-Leahy Scale: Fair                               Pertinent Vitals/Pain Pain Assessment: No/denies pain    Home Living Family/patient expects to be discharged to:: Private residence Living Arrangements: Spouse/significant other Available Help at Discharge: Family;Available 24 hours/day Type of Home: House Home Access: Ramped entrance  Home Layout: One level Home Equipment: Walker - 2 wheels;Cane - single point;Shower seat (no wheelchair, no oxygen)      Prior Function Level of Independence: Needs assistance   Gait / Transfers Assistance Needed: Ambulates with rolling walker at home  ADL's / Homemaking Assistance Needed: Independent with ADLs and IADLs with use of  rolling walker        Hand Dominance   Dominant Hand: Right    Extremity/Trunk Assessment   Upper Extremity Assessment Upper Extremity Assessment: Overall WFL for tasks assessed    Lower Extremity Assessment Lower Extremity Assessment: Generalized weakness       Communication   Communication: Interpreter utilized  Cognition Arousal/Alertness: Awake/alert Behavior During Therapy: WFL for tasks assessed/performed Overall Cognitive Status: Within Functional Limits for tasks assessed                                 General Comments: Oriented to year but not date.      General Comments      Exercises     Assessment/Plan    PT Assessment Patient needs continued PT services  PT Problem List Decreased strength;Decreased balance       PT Treatment Interventions DME instruction;Stair training;Gait training;Therapeutic activities;Therapeutic exercise;Balance training;Neuromuscular re-education;Patient/family education    PT Goals (Current goals can be found in the Care Plan section)  Acute Rehab PT Goals Patient Stated Goal: Return to prior function at home PT Goal Formulation: With patient Time For Goal Achievement: 08/19/17 Potential to Achieve Goals: Good    Frequency Min 2X/week   Barriers to discharge Decreased caregiver support Lives with wife who is blind and very limited in her ability to assist at home    Co-evaluation               AM-PAC PT "6 Clicks" Daily Activity  Outcome Measure Difficulty turning over in bed (including adjusting bedclothes, sheets and blankets)?: None Difficulty moving from lying on back to sitting on the side of the bed? : None Difficulty sitting down on and standing up from a chair with arms (e.g., wheelchair, bedside commode, etc,.)?: None Help needed moving to and from a bed to chair (including a wheelchair)?: None Help needed walking in hospital room?: A Little Help needed climbing 3-5 steps with a  railing? : A Little 6 Click Score: 22    End of Session Equipment Utilized During Treatment: Gait belt Activity Tolerance: Patient tolerated treatment well Patient left: in bed;with call bell/phone within reach;with bed alarm set;with family/visitor present   PT Visit Diagnosis: Unsteadiness on feet (R26.81);Muscle weakness (generalized) (M62.81)    Time: 3614-4315 PT Time Calculation (min) (ACUTE ONLY): 20 min   Charges:   PT Evaluation $PT Eval Low Complexity: 1 Low     PT G Codes:   PT G-Codes **NOT FOR INPATIENT CLASS** Functional Assessment Tool Used: AM-PAC 6 Clicks Basic Mobility Functional Limitation: Mobility: Walking and moving around Mobility: Walking and Moving Around Current Status (Q0086): At least 20 percent but less than 40 percent impaired, limited or restricted Mobility: Walking and Moving Around Goal Status [id number]): At least 1 percent but less than 20 percent impaired, limited or restricted    Phillips Grout PT, DPT    Kinte Trim 08/05/2017, 5:21 PM

## 2017-08-06 ENCOUNTER — Encounter: Payer: Self-pay | Admitting: Vascular Surgery

## 2017-08-06 ENCOUNTER — Inpatient Hospital Stay
Admission: EM | Disposition: A | Discharge status: Home / Self Care | Payer: Self-pay | Source: Home / Self Care | Attending: Internal Medicine

## 2017-08-06 DIAGNOSIS — N186 End stage renal disease: Secondary | ICD-10-CM

## 2017-08-06 HISTORY — PX: DIALYSIS/PERMA CATHETER INSERTION: CATH118288

## 2017-08-06 LAB — GLUCOSE, CAPILLARY
GLUCOSE-CAPILLARY: 107 mg/dL — AB (ref 65–99)
Glucose-Capillary: 108 mg/dL — ABNORMAL HIGH (ref 65–99)
Glucose-Capillary: 150 mg/dL — ABNORMAL HIGH (ref 65–99)

## 2017-08-06 SURGERY — DIALYSIS/PERMA CATHETER INSERTION
Anesthesia: Moderate Sedation

## 2017-08-06 MED ORDER — FENTANYL CITRATE (PF) 100 MCG/2ML IJ SOLN
INTRAMUSCULAR | Status: AC
  Filled 2017-08-06: qty 2

## 2017-08-06 MED ORDER — VANCOMYCIN HCL IN DEXTROSE 750-5 MG/150ML-% IV SOLN
750.0000 mg | Freq: Once | INTRAVENOUS | Status: DC
  Filled 2017-08-06: qty 150

## 2017-08-06 MED ORDER — HYDROCODONE-ACETAMINOPHEN 5-325 MG PO TABS
1.0000 | ORAL_TABLET | Freq: Once | ORAL | Status: AC
  Administered 2017-08-06: 1 via ORAL
  Filled 2017-08-06: qty 1

## 2017-08-06 MED ORDER — HEPARIN (PORCINE) IN NACL 2-0.9 UNIT/ML-% IJ SOLN
INTRAMUSCULAR | Status: AC
  Filled 2017-08-06: qty 500

## 2017-08-06 MED ORDER — MIDAZOLAM HCL 5 MG/5ML IJ SOLN
INTRAMUSCULAR | Status: AC
  Filled 2017-08-06: qty 5

## 2017-08-06 MED ORDER — VANCOMYCIN HCL IN DEXTROSE 750-5 MG/150ML-% IV SOLN: 750.0000 mg | INTRAVENOUS | 0 refills | Status: DC

## 2017-08-06 MED ORDER — FENTANYL CITRATE (PF) 100 MCG/2ML IJ SOLN
INTRAMUSCULAR | Status: DC | PRN
  Administered 2017-08-06: 50 ug via INTRAVENOUS
  Administered 2017-08-06: 25 ug via INTRAVENOUS

## 2017-08-06 MED ORDER — LIDOCAINE-EPINEPHRINE (PF) 2 %-1:200000 IJ SOLN
INTRAMUSCULAR | Status: AC
  Filled 2017-08-06: qty 20

## 2017-08-06 MED ORDER — HYDROCODONE-ACETAMINOPHEN 5-325 MG PO TABS: 1.0000 | ORAL_TABLET | Freq: Four times a day (QID) | ORAL | 0 refills | Status: DC | PRN

## 2017-08-06 MED ORDER — MIDAZOLAM HCL 2 MG/2ML IJ SOLN
INTRAMUSCULAR | Status: DC | PRN
  Administered 2017-08-06: 1 mg via INTRAVENOUS
  Administered 2017-08-06: 2 mg via INTRAVENOUS

## 2017-08-06 MED ORDER — DEXTROSE 5 % IV SOLN
INTRAVENOUS | Status: AC
  Filled 2017-08-06: qty 1.5

## 2017-08-06 MED ORDER — HEPARIN SODIUM (PORCINE) 10000 UNIT/ML IJ SOLN
INTRAMUSCULAR | Status: AC
  Filled 2017-08-06: qty 1

## 2017-08-06 SURGICAL SUPPLY — 7 items
BIOPATCH RED 1 DISK 7.0 (GAUZE/BANDAGES/DRESSINGS) ×2 IMPLANT
BIOPATCH RED 1IN DISK 7.0MM (GAUZE/BANDAGES/DRESSINGS) ×1
CANNULA 5F STIFF (CANNULA) ×3 IMPLANT
CATH PALINDROME RT-P 15FX23CM (CATHETERS) ×3 IMPLANT
DRAPE INCISE IOBAN 66X45 STRL (DRAPES) ×3 IMPLANT
SUT MNCRL AB 4-0 PS2 18 (SUTURE) ×3 IMPLANT
SUT PROLENE 0 CT 1 30 (SUTURE) ×6 IMPLANT

## 2017-08-06 NOTE — Progress Notes (Signed)
PRE HD   

## 2017-08-06 NOTE — H&P (Signed)
[facility] History & Physical Update  The patient was interviewed and re-examined.  The patient's previous History and Physical has been reviewed and is unchanged. His AVF was not able to be accessed so he needs a permcath. There is no change in the plan of care. We plan to proceed with the scheduled procedure.  Leotis Pain, MD  08/06/2017, 9:30 AM

## 2017-08-06 NOTE — Progress Notes (Signed)
Post Hd   

## 2017-08-06 NOTE — Progress Notes (Signed)
PRE HD assessment 

## 2017-08-06 NOTE — Progress Notes (Signed)
Pt to be discharged per MD order. IV removed. Instructions reviewed with pt and family using spanish interpreter. Scripts for HD given. Will discharge in wheelchair.

## 2017-08-06 NOTE — Care Management (Signed)
Spoke again with patient's daughter [name] regarding home health PT recommendation. She will talk with patient and family as they are undecided. I also recommended (per PT note) that patient go to [employer] or another supplying store to buy a cane- Angie agreed. RNCM will continue to follow. Per Angie, she is not sure what time Permacath will be placed and she understands that patient will have dialysis through [facility] prior to discharge.

## 2017-08-06 NOTE — Progress Notes (Signed)
[facility]  ROUNDING NOTE   Subjective:      [name] admitted to [facility] on 08/02/2017 for Sepsis, due to unspecified organism [facility]) [A41.9]   Patient recently returned from a one month trip to [place]. He started to have fevers and chills on the last 30 minutes of hemodialysis and was brought in for evaluation. Patient received his permcath today    Patient seen during dialysis Tolerating well    Objective:  Vital signs in last 24 hours:  Temp:  [97.7 F (36.5 C)-98.9 F (37.2 C)] 98.2 F (36.8 C) (08/22 0927) Pulse Rate:  [51-78] 78 (08/22 1100) Resp:  [17-21] 21 (08/22 1100) BP: (121-166)/(43-61) 130/57 (08/22 1100) SpO2:  [92 %-99 %] 94 % (08/22 1045) Weight:  [71.7 kg (158 lb)] 71.7 kg (158 lb) (08/22 0927)  Weight change:  Filed Weights   08/02/17 1506 08/04/17 1418 08/06/17 0927  Weight: 72 kg (158 lb 11.7 oz) 71.7 kg (158 lb) 71.7 kg (158 lb)    Intake/Output: I/O last 3 completed shifts: In: 191 [IV Piggyback:191] Out: 845 [Urine:845]   Intake/Output this shift:  No intake/output data recorded.  Physical Exam: General: NAD, laying in bed  Head: Normocephalic, atraumatic. Moist oral mucosal membranes  Eyes: Anicteric,   Neck: Supple,   Lungs:  Clear to auscultation  Heart: Regular rate and rhythm  Abdomen:  Soft, nontender,   Extremities: no peripheral edema.  Neurologic: Nonfocal, moving all four extremities  Skin: No lesions  Access:  Left forearm AVF - maturing; Left IJ PC    Basic Metabolic Panel:  Recent Labs Lab 08/02/17 1508 08/03/17 0521  NA 136 136  K 2.9* 3.8  CL 99* 104  CO2 25 25  GLUCOSE 162* 87  BUN 23* 37*  CREATININE 3.45* 4.19*  CALCIUM 8.4* 7.9*    Liver Function Tests:  Recent Labs Lab 08/02/17 1508  AST 54*  ALT 39  ALKPHOS 406*  BILITOT 1.4*  PROT 7.5  ALBUMIN 3.3*   No results for input(s): LIPASE, AMYLASE in the last 168 hours. No results for input(s): AMMONIA in the last  168 hours.  CBC:  Recent Labs Lab 08/02/17 1508 08/03/17 0521 08/04/17 0914  WBC 25.1* 18.5* 8.2  NEUTROABS 23.6*  --   --   HGB 12.2* 10.4* 11.1*  HCT 36.2* 30.8* 32.9*  MCV 90.3 92.5 91.6  PLT 205 173 215    Cardiac Enzymes:  Recent Labs Lab 08/02/17 1508  TROPONINI 0.09*    BNP: Invalid input(s): POCBNP  CBG:  Recent Labs Lab 08/05/17 1651 08/05/17 2130 08/06/17 0733 08/06/17 0934 08/06/17 1144  GLUCAP 124* 139* 107* 108* 150*    Microbiology: Results for orders placed or performed during the hospital encounter of 08/02/17  Urine culture     Status: Abnormal   Collection Time: 08/02/17  2:45 AM  Result Value Ref Range Status   Specimen Description URINE, RANDOM  Final   Special Requests NONE  Final   Culture MULTIPLE SPECIES PRESENT, SUGGEST RECOLLECTION (A)  Final   Report Status 08/04/2017 FINAL  Final  Blood culture (routine x 2)     Status: None (Preliminary result)   Collection Time: 08/02/17  4:15 PM  Result Value Ref Range Status   Specimen Description BLOOD BLRA  Final   Special Requests   Final    BOTTLES DRAWN AEROBIC AND ANAEROBIC Blood Culture adequate volume   Culture NO GROWTH 4 DAYS  Final   Report Status PENDING  Incomplete  Blood culture (routine x 2)     Status: None (Preliminary result)   Collection Time: 08/02/17  4:20 PM  Result Value Ref Range Status   Specimen Description BLOOD LAC  Final   Special Requests   Final    BOTTLES DRAWN AEROBIC AND ANAEROBIC Blood Culture adequate volume   Culture NO GROWTH 4 DAYS  Final   Report Status PENDING  Incomplete  MRSA PCR Screening     Status: Abnormal   Collection Time: 08/02/17  8:36 PM  Result Value Ref Range Status   MRSA by PCR POSITIVE (A) NEGATIVE Final    Comment:        The GeneXpert MRSA Assay (FDA approved for NASAL specimens only), is one component of a comprehensive MRSA colonization surveillance program. It is not intended to diagnose MRSA infection nor to guide  or monitor treatment for MRSA infections. RESULT CALLED TO, READ BACK BY AND VERIFIED WITH: STACEY CLAY AT 2304 08/02/17.PMH   Cath Tip Culture     Status: None (Preliminary result)   Collection Time: 08/04/17  3:43 PM  Result Value Ref Range Status   Specimen Description CATH TIP  Final   Special Requests NONE  Final   Culture   Final    NO GROWTH 2 DAYS Performed at [facility], [address], [place], [place] [address]    Report Status PENDING  Incomplete    Coagulation Studies: No results for input(s): LABPROT, INR in the last 72 hours.  Urinalysis: No results for input(s): COLORURINE, LABSPEC, PHURINE, GLUCOSEU, HGBUR, BILIRUBINUR, KETONESUR, PROTEINUR, UROBILINOGEN, NITRITE, LEUKOCYTESUR in the last 72 hours.  Invalid input(s): APPERANCEUR    Imaging: No results found.   Medications:   . piperacillin-tazobactam (ZOSYN)  IV Stopped (08/06/17 1207)  . vancomycin    . vancomycin     . allopurinol  100 mg Oral Daily  . aspirin EC  81 mg Oral Daily  . atorvastatin  20 mg Oral Daily  . Chlorhexidine Gluconate Cloth  6 each Topical Q0600  . feeding supplement (GLUCERNA SHAKE)  237 mL Oral TID BM  . heparin  5,000 Units Subcutaneous Q8H  . insulin aspart  0-5 Units Subcutaneous QHS  . insulin aspart  0-9 Units Subcutaneous TID WC  . Melatonin  5 mg Oral QHS  . mupirocin ointment  1 application Nasal BID  . pantoprazole  40 mg Oral Daily  . tamsulosin  0.4 mg Oral QPC supper   acetaminophen **OR** acetaminophen, hydrALAZINE, HYDROcodone-acetaminophen, ondansetron **OR** ondansetron (ZOFRAN) IV  Assessment/ Plan:  [name] is a 76 y.o. Hispanic male with end stage renal disease on hemodialysis, diabetes mellitus type II, hypertension, hepatic cirrhosis, BPH, gout, diabetic peripheral neuropathy  TTS [facility] Left IJ permcath (08/06/2017)  1. End Stage Renal Disease:   - Continue TTS schedule Complication with dialysis device:   Suspect cathter related infection. Blood cultures are negative; WBC count is now normal.  - Fistula is too small to stick. Encouraged patient to exercise. - permcath placed today. - Antibiotics called in Vanc and ceftazidime 1 gm each to outpatient unit- last dose Sep 1  2. Hypertension: hypotensive on admission.  Anti HTN meds restarted at home doses Monitor outpatient  3.  Secondary Hyperparathyroidism:   - Not currently on phos binders.  monitor  4. Anemia of chronic kidney disease: hemoglobin 11.1 - EPO as outpatient.     LOS: 4 Charmayne Odell 8/22/201812:17 PM

## 2017-08-06 NOTE — Discharge Instructions (Signed)
Resume your outpatient dialysis Tuesday Thursday Saturday as before 

## 2017-08-06 NOTE — Op Note (Signed)
OPERATIVE NOTE    PRE-OPERATIVE DIAGNOSIS: 1. ESRD 2. Recent right permcath infection, s/p removal  POST-OPERATIVE DIAGNOSIS: same as above  PROCEDURE: 1. Ultrasound guidance for vascular access to the left internal jugular vein 2. Fluoroscopic guidance for placement of catheter 3. Placement of a 23 cm tip to cuff tunneled hemodialysis catheter via the left internal jugular vein  SURGEON: Leotis Pain, MD  ANESTHESIA:  Local with Moderate conscious sedation for approximately 20 minutes using 3 mg of Versed and 75 mcg of Fentanyl  ESTIMATED BLOOD LOSS: 15 cc  FLUORO TIME: less than one minute  CONTRAST: none  FINDING(S): 1.  Patent left internal jugular vein  SPECIMEN(S):  None  INDICATIONS:   Martin Gilbert is a 76 y.o. male who presents with renal failure and an AVF that was not ready to be used.  His right sided permcath was infected and removed earlier in the week.  The patient needs long term dialysis access for their ESRD, and a Permcath is necessary.  Risks and benefits are discussed and informed consent is obtained.    DESCRIPTION: After obtaining full informed written consent, the patient was brought back to the vascular suited. The patient's left neck and chest were sterilely prepped and draped in a sterile surgical field was created. Moderate conscious sedation was administered during a face to face encounter with the patient throughout the procedure with my supervision of the RN administering medicines and monitoring the patient's vital signs, pulse oximetry, telemetry and mental status throughout from the start of the procedure until the patient was taken to the recovery room.  The left internal jugular vein was visualized with ultrasound and found to be patent. It was then accessed under direct ultrasound guidance and a permanent image was recorded. A wire was placed. After skin nick and dilatation, the peel-away sheath was placed over the wire. I then turned my  attention to an area under the clavicle. Approximately 1-2 fingerbreadths below the clavicle a small counterincision was created and tunneled from the subclavicular incision to the access site. Using fluoroscopic guidance, a 23 centimeter tip to cuff tunneled hemodialysis catheter was selected, and tunneled from the subclavicular incision to the access site. It was then placed through the peel-away sheath and the peel-away sheath was removed. Using fluoroscopic guidance the catheter tips were parked in the right atrium. The appropriate distal connectors were placed. It withdrew blood well and flushed easily with heparinized saline and a concentrated heparin solution was then placed. It was secured to the chest wall with 2 Prolene sutures. The access incision was closed single 4-0 Monocryl. A 4-0 Monocryl pursestring suture was placed around the exit site. Sterile dressings were placed. The patient tolerated the procedure well and was taken to the recovery room in stable condition.  COMPLICATIONS: None  CONDITION: Stable  Leotis Pain  08/06/2017, 10:13 AM   This note was created with Dragon Medical transcription system. Any errors in dictation are purely unintentional.

## 2017-08-06 NOTE — Progress Notes (Signed)
HD Started 

## 2017-08-06 NOTE — Discharge Summary (Signed)
[facility] NAME: Martin Gilbert    MR#:  [id number]  DATE OF BIRTH:  May 09, 1941  DATE OF ADMISSION:  08/02/2017 ADMITTING PHYSICIAN: Loletha Grayer, MD  DATE OF DISCHARGE: 08/06/2017  PRIMARY CARE PHYSICIAN: [name], MD    ADMISSION DIAGNOSIS:  Sepsis, due to unspecified organism ([place]) [A41.9]  DISCHARGE DIAGNOSIS:  Sepsis suspected due to PermCath infection--- patient has new PermCath End-stage renal disease on hemodialysis  SECONDARY DIAGNOSIS:   Past Medical History:  Diagnosis Date  . Anemia   . Back pain   . BPH (benign prostatic hyperplasia)   . CKD (chronic kidney disease), stage IV ([place])   . Clostridium difficile colitis   . Diabetes mellitus without complication ([place])   . GERD (gastroesophageal reflux disease)   . HTN (hypertension)   . Hyperlipidemia     HOSPITAL COURSE:   [name] a 76 y.o.malewith a known history of End-stage renal disease. He presents with fever today and cold chills. He's also had some low blood pressure which is not usual for him. He had a full dialysis session today. He's been feeling very weak and no appetite. He's had an occasional headache. He has a little neck pain  1. Clinical sepsis with fever, relative hypotension and leukocytosis. -empirically on IV vancomycin and Zosyn--Dr. Candiss Norse will dose vancomycin and cefepime at discharge which can be given at dialysis. -No fever -Source not identified, PermCath site looks okay - blood cultures negative so far. -Patient is status post PermCath removal on 08/04/2017. He is status post new PermCath placement on 08/06/2017 by Dr. Lucky Cowboy  2. Hypokalemia. Replace one dose of potassium orally  3. BPH on Flomax  4. Type 2 diabetes mellitus. Place on sliding scale  5. End-stage renal disease on dialysis had full dialysis today. Consulted nephrology.  Patient was discharged to home after  dialysis today. CONSULTS OBTAINED:  Treatment Team:  Lavonia Dana, MD Algernon Huxley, MD  DRUG ALLERGIES:   Allergies  Allergen Reactions  . Aripiprazole     Denial of this listed allergy.    DISCHARGE MEDICATIONS:   Current Discharge Medication List    START taking these medications   Details  Vancomycin (VANCOCIN) 750-5 MG/150ML-% SOLN Inject 150 mLs (750 mg total) into the vein Every Tuesday,Thursday,and Saturday with dialysis. Qty: 4000 mL, Refills: 0      CONTINUE these medications which have CHANGED   Details  HYDROcodone-acetaminophen (NORCO) 5-325 MG tablet Take 1 tablet by mouth every 6 (six) hours as needed for moderate pain or severe pain. Qty: 25 tablet, Refills: 0      CONTINUE these medications which have NOT CHANGED   Details  allopurinol (ZYLOPRIM) 100 MG tablet Take 100 mg by mouth daily.    amLODipine (NORVASC) 2.5 MG tablet Take 2.5 mg by mouth daily.    aspirin 81 MG tablet Take 81 mg by mouth daily.    atorvastatin (LIPITOR) 20 MG tablet Take 20 mg by mouth daily.    ferrous sulfate 325 (65 FE) MG tablet Take 325 mg by mouth daily with breakfast. Refills: 11    glimepiride (AMARYL) 2 MG tablet Take 2 mg by mouth 2 (two) times daily.     hydrALAZINE (APRESOLINE) 50 MG tablet Take 50 mg by mouth 3 (three) times daily.    metoprolol succinate (TOPROL-XL) 25 MG 24 hr tablet Take 25 mg by mouth daily.    Polyvinyl Alcohol-Povidone (REFRESH OP) Place 1 drop into  both eyes 2 (two) times daily.    tamsulosin (FLOMAX) 0.4 MG CAPS capsule Take 1 capsule (0.4 mg total) by mouth daily after supper. Qty: 30 capsule, Refills: 0    Vitamin D, Cholecalciferol, 1000 UNITS TABS Take by mouth.    feeding supplement, GLUCERNA SHAKE, (GLUCERNA SHAKE) LIQD Take 237 mLs by mouth 3 (three) times daily between meals. Qty: 90 Can, Refills: 6    Melatonin 5 MG TABS Take 1 tablet (5 mg total) by mouth at bedtime. Qty: 30 tablet, Refills: 0    ondansetron  (ZOFRAN) 4 MG tablet Take 1 tablet (4 mg total) by mouth every 6 (six) hours as needed for nausea. Qty: 20 tablet, Refills: 0    pantoprazole (PROTONIX) 40 MG tablet Take 1 tablet (40 mg total) by mouth daily. Qty: 30 tablet, Refills: 6      STOP taking these medications     acetaminophen (TYLENOL) 500 MG tablet      sodium bicarbonate 650 MG tablet      gabapentin (NEURONTIN) 100 MG capsule      vancomycin (VANCOCIN) 50 mg/mL oral solution      vancomycin (VANCOCIN) 50 mg/mL oral solution      vancomycin (VANCOCIN) 50 mg/mL oral solution      vancomycin (VANCOCIN) 50 mg/mL oral solution      vancomycin (VANCOCIN) 50 mg/mL oral solution         If you experience worsening of your admission symptoms, develop shortness of breath, life threatening emergency, suicidal or homicidal thoughts you must seek medical attention immediately by calling 911 or calling your MD immediately  if symptoms less severe.  You Must read complete instructions/literature along with all the possible adverse reactions/side effects for all the Medicines you take and that have been prescribed to you. Take any new Medicines after you have completely understood and accept all the possible adverse reactions/side effects.   Please note  You were cared for by a hospitalist during your hospital stay. If you have any questions about your discharge medications or the care you received while you were in the hospital after you are discharged, you can call the unit and asked to speak with the hospitalist on call if the hospitalist that took care of you is not available. Once you are discharged, your primary care physician will handle any further medical issues. Please note that NO REFILLS for any discharge medications will be authorized once you are discharged, as it is imperative that you return to your primary care physician (or establish a relationship with a primary care physician if you do not have one) for your  aftercare needs so that they can reassess your need for medications and monitor your lab values. Today   SUBJECTIVE     VITAL SIGNS:  Blood pressure (!) 130/57, pulse 78, temperature 98.2 F (36.8 C), temperature source Oral, resp. rate (!) 21, height 5\' 7"  (1.702 m), weight 71.7 kg (158 lb), SpO2 94 %.  I/O:   Intake/Output Summary (Last 24 hours) at 08/06/17 1148 Last data filed at 08/06/17 0520  Gross per 24 hour  Intake              101 ml  Output              320 ml  Net             -219 ml    PHYSICAL EXAMINATION:  GENERAL:  76 y.o.-year-old patient lying in the bed with no acute  distress.  EYES: Pupils equal, round, reactive to light and accommodation. No scleral icterus. Extraocular muscles intact.  HEENT: Head atraumatic, normocephalic. Oropharynx and nasopharynx clear.  NECK:  Supple, no jugular venous distention. No thyroid enlargement, no tenderness.  LUNGS: Normal breath sounds bilaterally, no wheezing, rales,rhonchi or crepitation. No use of accessory muscles of respiration. PermCath+ CARDIOVASCULAR: S1, S2 normal. No murmurs, rubs, or gallops.  ABDOMEN: Soft, non-tender, non-distended. Bowel sounds present. No organomegaly or mass.  EXTREMITIES: No pedal edema, cyanosis, or clubbing.  NEUROLOGIC: Cranial nerves II through XII are intact. Muscle strength 5/5 in all extremities. Sensation intact. Gait not checked.  PSYCHIATRIC: The patient is alert and oriented x 3.  SKIN: No obvious rash, lesion, or ulcer.   DATA REVIEW:   CBC   Recent Labs Lab 08/04/17 0914  WBC 8.2  HGB 11.1*  HCT 32.9*  PLT 215    Chemistries   Recent Labs Lab 08/02/17 1508 08/03/17 0521  NA 136 136  K 2.9* 3.8  CL 99* 104  CO2 25 25  GLUCOSE 162* 87  BUN 23* 37*  CREATININE 3.45* 4.19*  CALCIUM 8.4* 7.9*  AST 54*  --   ALT 39  --   ALKPHOS 406*  --   BILITOT 1.4*  --     Microbiology Results   Recent Results (from the past 240 hour(s))  Urine culture     Status:  Abnormal   Collection Time: 08/02/17  2:45 AM  Result Value Ref Range Status   Specimen Description URINE, RANDOM  Final   Special Requests NONE  Final   Culture MULTIPLE SPECIES PRESENT, SUGGEST RECOLLECTION (A)  Final   Report Status 08/04/2017 FINAL  Final  Blood culture (routine x 2)     Status: None (Preliminary result)   Collection Time: 08/02/17  4:15 PM  Result Value Ref Range Status   Specimen Description BLOOD BLRA  Final   Special Requests   Final    BOTTLES DRAWN AEROBIC AND ANAEROBIC Blood Culture adequate volume   Culture NO GROWTH 4 DAYS  Final   Report Status PENDING  Incomplete  Blood culture (routine x 2)     Status: None (Preliminary result)   Collection Time: 08/02/17  4:20 PM  Result Value Ref Range Status   Specimen Description BLOOD LAC  Final   Special Requests   Final    BOTTLES DRAWN AEROBIC AND ANAEROBIC Blood Culture adequate volume   Culture NO GROWTH 4 DAYS  Final   Report Status PENDING  Incomplete  MRSA PCR Screening     Status: Abnormal   Collection Time: 08/02/17  8:36 PM  Result Value Ref Range Status   MRSA by PCR POSITIVE (A) NEGATIVE Final    Comment:        The GeneXpert MRSA Assay (FDA approved for NASAL specimens only), is one component of a comprehensive MRSA colonization surveillance program. It is not intended to diagnose MRSA infection nor to guide or monitor treatment for MRSA infections. RESULT CALLED TO, READ BACK BY AND VERIFIED WITH: STACEY CLAY AT 2304 08/02/17.PMH   Cath Tip Culture     Status: None (Preliminary result)   Collection Time: 08/04/17  3:43 PM  Result Value Ref Range Status   Specimen Description CATH TIP  Final   Special Requests NONE  Final   Culture   Final    NO GROWTH 2 DAYS Performed at [facility], [address], [place], [place] [address]  Report Status PENDING  Incomplete    RADIOLOGY:  No results found.   Management plans discussed with the patient, family and they are in  agreement.  CODE STATUS:     Code Status Orders        Start     Ordered   08/02/17 1835  Full code  Continuous     08/02/17 1834    Code Status History    Date Active Date Inactive Code Status Order ID Comments User Context   03/27/2017  5:42 AM 04/04/2017  9:41 PM Full Code [id number]  Saundra Shelling, MD Inpatient   02/15/2017  9:11 AM 02/19/2017  6:04 PM Full Code [id number]  Saundra Shelling, MD Inpatient   01/31/2017  2:36 AM 02/02/2017  8:22 PM Full Code [id number]  Saundra Shelling, MD ED      TOTAL TIME TAKING CARE OF THIS PATIENT: 40 minutes.    Vinita Prentiss M.D on 08/06/2017 at 11:48 AM  Between 7am to 6pm - Pager - [phone] After 6pm go to [email or web address] - password [email or web address] [place] [facility] [facility]  Office  [phone]  CC: Primary care physician; [name], MD

## 2017-08-06 NOTE — Progress Notes (Signed)
HD ended 

## 2017-08-07 ENCOUNTER — Emergency Department: Payer: Medicare Other

## 2017-08-07 ENCOUNTER — Emergency Department
Admission: EM | Admit: 2017-08-07 | Discharge: 2017-08-07 | Disposition: A | Discharge status: Home / Self Care | Payer: Medicare Other | Attending: Emergency Medicine | Admitting: Emergency Medicine

## 2017-08-07 ENCOUNTER — Encounter: Payer: Self-pay | Admitting: Emergency Medicine

## 2017-08-07 DIAGNOSIS — Z79899 Other long term (current) drug therapy: Secondary | ICD-10-CM | POA: Insufficient documentation

## 2017-08-07 DIAGNOSIS — Z5189 Encounter for other specified aftercare: Secondary | ICD-10-CM

## 2017-08-07 DIAGNOSIS — Z7982 Long term (current) use of aspirin: Secondary | ICD-10-CM | POA: Insufficient documentation

## 2017-08-07 DIAGNOSIS — E119 Type 2 diabetes mellitus without complications: Secondary | ICD-10-CM | POA: Diagnosis not present

## 2017-08-07 DIAGNOSIS — Z992 Dependence on renal dialysis: Secondary | ICD-10-CM | POA: Insufficient documentation

## 2017-08-07 DIAGNOSIS — N186 End stage renal disease: Secondary | ICD-10-CM | POA: Insufficient documentation

## 2017-08-07 DIAGNOSIS — L7622 Postprocedural hemorrhage and hematoma of skin and subcutaneous tissue following other procedure: Secondary | ICD-10-CM | POA: Insufficient documentation

## 2017-08-07 LAB — CBC WITH DIFFERENTIAL/PLATELET
BASOS PCT: 1 %
Basophils Absolute: 0 10*3/uL (ref 0–0.1)
Eosinophils Absolute: 0.2 10*3/uL (ref 0–0.7)
Eosinophils Relative: 4 %
HEMATOCRIT: 34.2 % — AB (ref 40.0–52.0)
HEMOGLOBIN: 11.6 g/dL — AB (ref 13.0–18.0)
Lymphocytes Relative: 45 %
Lymphs Abs: 1.7 10*3/uL (ref 1.0–3.6)
MCH: 31.4 pg (ref 26.0–34.0)
MCHC: 34 g/dL (ref 32.0–36.0)
MCV: 92.5 fL (ref 80.0–100.0)
MONOS PCT: 13 %
Monocytes Absolute: 0.5 10*3/uL (ref 0.2–1.0)
NEUTROS ABS: 1.4 10*3/uL (ref 1.4–6.5)
NEUTROS PCT: 37 %
Platelets: 228 10*3/uL (ref 150–440)
RBC: 3.7 MIL/uL — ABNORMAL LOW (ref 4.40–5.90)
RDW: 15.4 % — ABNORMAL HIGH (ref 11.5–14.5)
WBC: 3.7 10*3/uL — ABNORMAL LOW (ref 3.8–10.6)

## 2017-08-07 LAB — BASIC METABOLIC PANEL
ANION GAP: 11 (ref 5–15)
BUN: 29 mg/dL — ABNORMAL HIGH (ref 6–20)
CALCIUM: 8.7 mg/dL — AB (ref 8.9–10.3)
CHLORIDE: 98 mmol/L — AB (ref 101–111)
CO2: 26 mmol/L (ref 22–32)
Creatinine, Ser: 4.23 mg/dL — ABNORMAL HIGH (ref 0.61–1.24)
GFR calc non Af Amer: 12 mL/min — ABNORMAL LOW (ref >60)
GFR, EST AFRICAN AMERICAN: 14 mL/min — AB (ref >60)
Glucose, Bld: 121 mg/dL — ABNORMAL HIGH (ref 65–99)
POTASSIUM: 3.7 mmol/L (ref 3.5–5.1)
Sodium: 135 mmol/L (ref 135–145)

## 2017-08-07 LAB — CULTURE, BLOOD (ROUTINE X 2)
CULTURE: NO GROWTH
Culture: NO GROWTH
SPECIAL REQUESTS: ADEQUATE
Special Requests: ADEQUATE

## 2017-08-07 LAB — CATH TIP CULTURE: CULTURE: NO GROWTH

## 2017-08-07 MED ORDER — HYDROGEN PEROXIDE 3 % EX SOLN
Freq: Once | CUTANEOUS | Status: DC
  Filled 2017-08-07: qty 473

## 2017-08-07 NOTE — ED Triage Notes (Signed)
Patient had a perm cath placed yesterday. Patient started bleeding from site today. Patient with dressing saturated.

## 2017-08-07 NOTE — ED Provider Notes (Signed)
[facility] Emergency Department Provider Note   ____________________________________________   First MD Initiated Contact with Patient 08/07/17 [phone]     (approximate)  I have reviewed the triage vital signs and the nursing notes.   HISTORY  Chief Complaint Post-op Problem   HPI Martin Gilbert is a 76 y.o. male Patient had a permacath placed yesterday. He started bleeding from the site today the dressing was saturated came into the emergency room. In the emergency room is not hurting him there is some active bleeding is difficult to tell if it's from the site where it was sewn into the skin or from the site of insertion of the catheter. The area was cleaned small pieces of 2 x 2 were inserted under the catheter both areas and he was watched it was a small amount of bleeding in both places but very little. Chest x-ray was done showed no apparent pleural effusion H&H actually had gone up slightly from the 20th   Past Medical History:  Diagnosis Date  . Anemia   . Back pain   . BPH (benign prostatic hyperplasia)   . CKD (chronic kidney disease), stage IV ([place])   . Clostridium difficile colitis   . Diabetes mellitus without complication ([place])   . GERD (gastroesophageal reflux disease)   . HTN (hypertension)   . Hyperlipidemia     Patient Active Problem List   Diagnosis Date Noted  . ESRD on dialysis ([place]) 06/03/2017  . Colitis 03/27/2017  . Demand ischemia ([place]) 03/27/2017  . ARF (acute renal failure) ([place]) 03/27/2017  . Enteritis due to Clostridium difficile   . Protein-calorie malnutrition, severe 02/18/2017  . C. difficile colitis 02/15/2017  . UTI (urinary tract infection) 02/02/2017  . E coli infection 02/02/2017  . Lactic acidosis 02/02/2017  . Diabetic neuropathy ([place]) 02/02/2017  . Left-sided chest wall pain 02/02/2017  . Fall 02/02/2017  . Generalized weakness 02/02/2017  . Pink eye, left 02/02/2017  . Leukocytosis 02/02/2017    . Anemia of chronic disease 02/02/2017  . Nausea 02/02/2017  . Sepsis ([place]) 01/31/2017  . Essential hypertension 11/28/2015  . Pain in the chest 02/15/2015  . Neck pain 02/15/2015  . Diabetes type 2, uncontrolled ([place]) 02/15/2015  . Chronic kidney disease, stage IV (severe) ([place]) 02/15/2015  . Frequent headaches 02/15/2015    Past Surgical History:  Procedure Laterality Date  . AV FISTULA PLACEMENT Left 06/13/2017   Procedure: ARTERIOVENOUS (AV) FISTULA CREATION ( RADIAL CEPHALIC );  Surgeon: Katha Cabal, MD;  Location: [facility] ORS;  Service: Vascular;  Laterality: Left;  . CATARACT EXTRACTION     one eye not sure which  . DIALYSIS/PERMA CATHETER INSERTION N/A 04/03/2017   Procedure: Dialysis/Perma Catheter Insertion;  Surgeon: Algernon Huxley, MD;  Location: [place] [facility];  Service: Cardiovascular;  Laterality: N/A;  . DIALYSIS/PERMA CATHETER INSERTION N/A 08/06/2017   Procedure: DIALYSIS/PERMA CATHETER INSERTION;  Surgeon: Algernon Huxley, MD;  Location: [facility];  Service: Cardiovascular;  Laterality: N/A;  . DIALYSIS/PERMA CATHETER REMOVAL N/A 08/04/2017   Procedure: DIALYSIS/PERMA CATHETER REMOVAL;  Surgeon: Algernon Huxley, MD;  Location: [place] [facility];  Service: Cardiovascular;  Laterality: N/A;  . hernia repair      Prior to Admission medications   Medication Sig Start Date End Date Taking? Authorizing Provider  allopurinol (ZYLOPRIM) 100 MG tablet Take 100 mg by mouth daily. 10/17/15   [provider]  amLODipine (NORVASC) 2.5 MG tablet Take 2.5 mg by  mouth daily.    [provider]  aspirin 81 MG tablet Take 81 mg by mouth daily.    [provider]  atorvastatin (LIPITOR) 20 MG tablet Take 20 mg by mouth daily.    [provider]  feeding supplement, GLUCERNA SHAKE, (GLUCERNA SHAKE) LIQD Take 237 mLs by mouth 3 (three) times daily between meals. Patient not taking: Reported on 08/03/2017 02/02/17   Theodoro Grist, MD   ferrous sulfate 325 (65 FE) MG tablet Take 325 mg by mouth daily with breakfast. 08/30/15   [provider]  glimepiride (AMARYL) 2 MG tablet Take 2 mg by mouth 2 (two) times daily.  04/24/17   [provider]  hydrALAZINE (APRESOLINE) 50 MG tablet Take 50 mg by mouth 3 (three) times daily.    [provider]  HYDROcodone-acetaminophen (NORCO) 5-325 MG tablet Take 1 tablet by mouth every 6 (six) hours as needed for moderate pain or severe pain. 08/06/17   Fritzi Mandes, MD  Melatonin 5 MG TABS Take 1 tablet (5 mg total) by mouth at bedtime. Patient not taking: Reported on 08/03/2017 04/04/17   Gladstone Lighter, MD  metoprolol succinate (TOPROL-XL) 25 MG 24 hr tablet Take 25 mg by mouth daily.    [provider]  ondansetron (ZOFRAN) 4 MG tablet Take 1 tablet (4 mg total) by mouth every 6 (six) hours as needed for nausea. Patient not taking: Reported on 08/03/2017 02/02/17   Theodoro Grist, MD  pantoprazole (PROTONIX) 40 MG tablet Take 1 tablet (40 mg total) by mouth daily. Patient not taking: Reported on 08/03/2017 02/03/17   Theodoro Grist, MD  Polyvinyl Alcohol-Povidone (REFRESH OP) Place 1 drop into both eyes 2 (two) times daily.    [provider]  tamsulosin (FLOMAX) 0.4 MG CAPS capsule Take 1 capsule (0.4 mg total) by mouth daily after supper. 02/19/17   Epifanio Lesches, MD  Vancomycin (VANCOCIN) 750-5 MG/150ML-% SOLN Inject 150 mLs (750 mg total) into the vein Every Tuesday,Thursday,and Saturday with dialysis. 08/07/17   Fritzi Mandes, MD  Vitamin D, Cholecalciferol, 1000 UNITS TABS Take by mouth.    [provider]    Allergies Aripiprazole  Family History  Problem Relation Age of Onset  . Hypertension Mother   . Diabetes Neg Hx     Social History Social History  Substance Use Topics  . Smoking status: Never Smoker  . Smokeless tobacco: Never Used  . Alcohol use No     Comment: Drank in the past, but has stopped for several years.      Review of Systems  Constitutional: No fever/chills Eyes: No visual changes. ENT: No sore throat. Cardiovascular: Denies chest pain. Respiratory: Denies shortness of breath. Gastrointestinal: No abdominal pain.  No nausea, no vomiting.  No diarrhea.  No constipation. Genitourinary: Negative for dysuria. Musculoskeletal: Negative for back pain. Skin: Negative for rash. Neurological: Negative for headaches, focal weakness   ____________________________________________   PHYSICAL EXAM:  VITAL SIGNS: ED Triage Vitals  Enc Vitals Group     BP 08/07/17 1903 (!) 109/49     Pulse Rate 08/07/17 1903 72     Resp 08/07/17 1903 18     Temp 08/07/17 1903 98.8 F (37.1 C)     Temp Source 08/07/17 1903 Oral     SpO2 08/07/17 1903 94 %     Weight 08/07/17 1902 143 lb 4.8 oz (65 kg)     Height 08/07/17 1902 5\' 5"  (1.651 m)     Head Circumference --  Peak Flow --      Pain Score 08/07/17 2000 0     Pain Loc --      Pain Edu? --      Excl. in [place]? --     Constitutional: Alert and oriented. Well appearing and in no acute distress. Eyes: Conjunctivae are normal Head: Atraumatic. Nose: No congestion/rhinnorhea. Mouth/Throat: Mucous membranes are moist.  Oropharynx non-erythematous. Neck: No stridor.   Cardiovascular: Normal rate, regular rhythm. Grossly normal heart sounds.  Good peripheral circulation. Respiratory: Normal respiratory effort.  No retractions. Lungs CTAB. Gastrointestinal: Soft and nontender. No distention. No abdominal bruits. No CVA tenderness. Musculoskeletal: No lower extremity tenderness nor edema.  No joint effusions. Neurologic:  Normal speech and language. No gross focal neurologic deficits are appreciated. No gait instability. Skin:  Skin is warm, dry and intact. No rash noted. Psychiatric: Mood and affect are normal. Speech and behavior are normal.  ____________________________________________   LABS (all labs ordered are listed, but only abnormal  results are displayed)  Labs Reviewed  BASIC METABOLIC PANEL - Abnormal; Notable for the following:       Result Value   Chloride 98 (*)    Glucose, Bld 121 (*)    BUN 29 (*)    Creatinine, Ser 4.23 (*)    Calcium 8.7 (*)    GFR calc non Af Amer 12 (*)    GFR calc Af Amer 14 (*)    All other components within normal limits  CBC WITH DIFFERENTIAL/PLATELET - Abnormal; Notable for the following:    WBC 3.7 (*)    RBC 3.70 (*)    Hemoglobin 11.6 (*)    HCT 34.2 (*)    RDW 15.4 (*)    All other components within normal limits   ____________________________________________  EKG   ____________________________________________  RADIOLOGY  Dg Chest 2 View  Result Date: 08/07/2017 CLINICAL DATA:  76 y/o  M; PermCath placed yesterday. EXAM: CHEST  2 VIEW COMPARISON:  08/02/2017 chest radiograph FINDINGS: The stable normal cardiac silhouette. Left central venous catheter tip projects over the cavoatrial junction. Linear left lung base opacity compatible with atelectasis. No focal consolidation. No pleural effusion or pneumothorax. Mild multilevel degenerative changes of the spine. IMPRESSION: Left central venous catheter tip projects over the cavoatrial junction. Electronically Signed   By: Kristine Garbe M.D.   On: 08/07/2017 19:47    ____________________________________________   PROCEDURES  Procedure(s) performed:   Procedures  Critical Care performed:  ____________________________________________   INITIAL IMPRESSION / ASSESSMENT AND PLAN / ED COURSE  Pertinent labs & imaging results that were available during my care of the patient were reviewed by me and considered in my medical decision making (see chart for details).  Discussed with Dr. Hinton Lovely there is hardly any bleeding at present. Slight use he suggests surgicel over the stitches and insertion site then Tegaderm then wadded gaauze with elastoplast on top to put pressure on it top. Patient can follow-up  as planned return for any further problems.      ____________________________________________   FINAL CLINICAL IMPRESSION(S) / ED DIAGNOSES  Final diagnoses:  Visit for wound check      NEW MEDICATIONS STARTED DURING THIS VISIT:  New Prescriptions   No medications on file     Note:  This document was prepared using Dragon voice recognition software and may include unintentional dictation errors.    Nena Polio, MD 08/07/17 2043

## 2017-08-07 NOTE — Discharge Instructions (Signed)
Keep your follow-up appointments. Keep a close eye on the dressing. If the dressing gets saturated with blood like it was when you got here come right back in. Even if it's in the middle of the night and you noticed that please come right in. Call the ambulance if you can not get a ride.

## 2017-08-07 NOTE — ED Notes (Signed)
Patient transported to X-ray at this time 

## 2017-08-28 ENCOUNTER — Other Ambulatory Visit (INDEPENDENT_AMBULATORY_CARE_PROVIDER_SITE_OTHER): Payer: Self-pay | Admitting: Vascular Surgery

## 2017-08-28 DIAGNOSIS — N185 Chronic kidney disease, stage 5: Secondary | ICD-10-CM

## 2017-09-01 ENCOUNTER — Other Ambulatory Visit (INDEPENDENT_AMBULATORY_CARE_PROVIDER_SITE_OTHER): Payer: Self-pay | Admitting: Vascular Surgery

## 2017-09-01 ENCOUNTER — Ambulatory Visit (INDEPENDENT_AMBULATORY_CARE_PROVIDER_SITE_OTHER): Payer: Medicare Other | Admitting: Vascular Surgery

## 2017-09-01 ENCOUNTER — Ambulatory Visit (INDEPENDENT_AMBULATORY_CARE_PROVIDER_SITE_OTHER): Payer: Medicare Other

## 2017-09-01 ENCOUNTER — Encounter (INDEPENDENT_AMBULATORY_CARE_PROVIDER_SITE_OTHER): Payer: Self-pay | Admitting: Vascular Surgery

## 2017-09-01 ENCOUNTER — Encounter (INDEPENDENT_AMBULATORY_CARE_PROVIDER_SITE_OTHER): Payer: Self-pay

## 2017-09-01 VITALS — BP 126/57 | HR 53 | Resp 16 | Ht 67.0 in | Wt 167.0 lb

## 2017-09-01 DIAGNOSIS — I10 Essential (primary) hypertension: Secondary | ICD-10-CM

## 2017-09-01 DIAGNOSIS — N185 Chronic kidney disease, stage 5: Secondary | ICD-10-CM

## 2017-09-01 DIAGNOSIS — N186 End stage renal disease: Secondary | ICD-10-CM

## 2017-09-01 DIAGNOSIS — T829XXA Unspecified complication of cardiac and vascular prosthetic device, implant and graft, initial encounter: Secondary | ICD-10-CM | POA: Insufficient documentation

## 2017-09-01 DIAGNOSIS — T829XXS Unspecified complication of cardiac and vascular prosthetic device, implant and graft, sequela: Secondary | ICD-10-CM

## 2017-09-01 DIAGNOSIS — Z992 Dependence on renal dialysis: Secondary | ICD-10-CM

## 2017-09-01 NOTE — Progress Notes (Signed)
MRN : [id number]  Antwane Grose is a 76 y.o. (12/30/1940) male who presents with chief complaint of  Chief Complaint  Patient presents with  . Follow-up    6-8 week HDA f/u  .  History of Present Illness: The patient returns to the office for follow up regarding problem with the dialysis access. Currently the patient is maintained via a left IJ catheter which is functioning well  The patient has a left radial cephalic fistula created in  June, 2018 that has not matured  The patient denies hand pain or other symptoms consistent with steal phenomena.  No significant arm swelling.  The patient denies redness or swelling at the access site. The patient denies fever or chills at home or while on dialysis.  The patient denies amaurosis fugax or recent TIA symptoms. There are no recent neurological changes noted. The patient denies claudication symptoms or rest pain symptoms. The patient denies history of DVT, PE or superficial thrombophlebitis. The patient denies recent episodes of angina or shortness of breath.   Duplex ultrasound shows a patent fistula with very low volume flow  No outpatient prescriptions have been marked as taking for the 09/01/17 encounter (Office Visit) with Delana Meyer, Dolores Lory, MD.   Current Facility-Administered Medications for the 09/01/17 encounter (Office Visit) with Delana Meyer, Dolores Lory, MD  Medication  . vancomycin (VANCOCIN) 1,000 mg in sodium chloride 0.9 % 500 mL IVPB    Past Medical History:  Diagnosis Date  . Anemia   . Back pain   . BPH (benign prostatic hyperplasia)   . CKD (chronic kidney disease), stage IV ([place])   . Clostridium difficile colitis   . Diabetes mellitus without complication ([place])   . GERD (gastroesophageal reflux disease)   . HTN (hypertension)   . Hyperlipidemia     Past Surgical History:  Procedure Laterality Date  . AV FISTULA PLACEMENT Left 06/13/2017   Procedure: ARTERIOVENOUS (AV) FISTULA CREATION ( RADIAL  CEPHALIC );  Surgeon: Katha Cabal, MD;  Location: [facility];  Service: Vascular;  Laterality: Left;  . CATARACT EXTRACTION     one eye not sure which  . DIALYSIS/PERMA CATHETER INSERTION N/A 04/03/2017   Procedure: Dialysis/Perma Catheter Insertion;  Surgeon: Algernon Huxley, MD;  Location: [place] [facility];  Service: Cardiovascular;  Laterality: N/A;  . DIALYSIS/PERMA CATHETER INSERTION N/A 08/06/2017   Procedure: DIALYSIS/PERMA CATHETER INSERTION;  Surgeon: Algernon Huxley, MD;  Location: [place] [facility];  Service: Cardiovascular;  Laterality: N/A;  . DIALYSIS/PERMA CATHETER REMOVAL N/A 08/04/2017   Procedure: DIALYSIS/PERMA CATHETER REMOVAL;  Surgeon: Algernon Huxley, MD;  Location: [place] [facility];  Service: Cardiovascular;  Laterality: N/A;  . hernia repair      Social History Social History  Substance Use Topics  . Smoking status: Never Smoker  . Smokeless tobacco: Never Used  . Alcohol use No     Comment: Drank in the past, but has stopped for several years.     Family History Family History  Problem Relation Age of Onset  . Hypertension Mother   . Diabetes Neg Hx     Allergies  Allergen Reactions  . Aripiprazole     Denial of this listed allergy.     REVIEW OF SYSTEMS (Negative unless checked)  Constitutional: [] Weight loss  [] Fever  [] Chills Cardiac: [] Chest pain   [] Chest pressure   [] Palpitations   [] Shortness of breath when laying flat   [] Shortness of breath with exertion. Vascular:  [] Pain in  legs with walking   [] Pain in legs at rest  [] History of DVT   [] Phlebitis   [] Swelling in legs   [] Varicose veins   [] Non-healing ulcers Pulmonary:   [] Uses home oxygen   [] Productive cough   [] Hemoptysis   [] Wheeze  [] COPD   [] Asthma Neurologic:  [] Dizziness   [] Seizures   [] History of stroke   [] History of TIA  [] Aphasia   [] Vissual changes   [] Weakness or numbness in arm   [] Weakness or numbness in leg Musculoskeletal:   [] Joint swelling   [] Joint pain   [] Low  back pain Hematologic:  [] Easy bruising  [] Easy bleeding   [] Hypercoagulable state   [] Anemic Gastrointestinal:  [] Diarrhea   [] Vomiting  [] Gastroesophageal reflux/heartburn   [] Difficulty swallowing. Genitourinary:  [x] Chronic kidney disease   [] Difficult urination  [] Frequent urination   [] Blood in urine Skin:  [] Rashes   [] Ulcers  Psychological:  [] History of anxiety   []  History of major depression.  Physical Examination  Vitals:   09/01/17 1127  BP: (!) 126/57  Pulse: (!) 53  Resp: 16  Weight: 167 lb (75.8 kg)  Height: 5\' 7"  (1.702 m)   Body mass index is 26.16 kg/m. Gen: WD/WN, NAD Head: [place]/AT, No temporalis wasting.  Ear/Nose/Throat: Hearing grossly intact, nares w/o erythema or drainage Eyes: PER, EOMI, sclera nonicteric.  Neck: Supple, no large masses.   Pulmonary:  Good air movement, no audible wheezing bilaterally, no use of accessory muscles.  Cardiac: RRR, no JVD Vascular: left wrist fistula good thrill but small and not well distended Vessel Right Left  Radial Palpable Palpable  Ulnar Palpable Palpable  Brachial Palpable Palpable  Carotid Palpable Palpable  Gastrointestinal: Non-distended. No guarding/no peritoneal signs.  Musculoskeletal: M/S 5/5 throughout.  No deformity or atrophy.  Neurologic: CN 2-12 intact. Symmetrical.  Speech is fluent. Motor exam as listed above. Psychiatric: Judgment intact, Mood & affect appropriate for pt's clinical situation. Dermatologic: No rashes or ulcers noted.  No changes consistent with cellulitis. Lymph : No lichenification or skin changes of chronic lymphedema.  CBC Lab Results  Component Value Date   WBC 3.7 (L) 08/07/2017   HGB 11.6 (L) 08/07/2017   HCT 34.2 (L) 08/07/2017   MCV 92.5 08/07/2017   PLT 228 08/07/2017    BMET    Component Value Date/Time   NA 135 08/07/2017 1953   NA 138 04/08/2015 2133   K 3.7 08/07/2017 1953   K 4.0 04/08/2015 2133   CL 98 (L) 08/07/2017 1953   CL 108 04/08/2015 2133   CO2  26 08/07/2017 1953   CO2 25 04/08/2015 2133   GLUCOSE 121 (H) 08/07/2017 1953   GLUCOSE 181 (H) 04/08/2015 2133   BUN 29 (H) 08/07/2017 1953   BUN 60 (H) 04/08/2015 2133   CREATININE 4.23 (H) 08/07/2017 1953   CREATININE 4.04 (H) 04/08/2015 2133   CALCIUM 8.7 (L) 08/07/2017 1953   CALCIUM 8.5 (L) 04/08/2015 2133   GFRNONAA 12 (L) 08/07/2017 1953   GFRNONAA 14 (L) 04/08/2015 2133   GFRAA 14 (L) 08/07/2017 1953   GFRAA 16 (L) 04/08/2015 2133   CrCl cannot be calculated (Patient's most recent lab result is older than the maximum 21 days allowed.).  COAG Lab Results  Component Value Date   INR 1.05 06/04/2017   INR 1.35 03/27/2017   INR 1.0 12/28/2012    Radiology Dg Chest 1 View  Result Date: 08/02/2017 CLINICAL DATA:  Fever.  Renal failure EXAM: CHEST 1 VIEW COMPARISON:  March 28, 2017 FINDINGS: Central catheter tip is at cavoatrial junction. No pneumothorax. There is no appreciable edema or consolidation. Heart size and pulmonary vascularity are normal. No adenopathy. No bone lesions. IMPRESSION: Central catheter tip at cavoatrial junction. No pneumothorax. No edema or consolidation. Cardiac silhouette within normal limits. Electronically Signed   By: Lowella Grip III M.D.   On: 08/02/2017 16:08   Dg Chest 2 View  Result Date: 08/07/2017 CLINICAL DATA:  76 y/o  M; PermCath placed yesterday. EXAM: CHEST  2 VIEW COMPARISON:  08/02/2017 chest radiograph FINDINGS: The stable normal cardiac silhouette. Left central venous catheter tip projects over the cavoatrial junction. Linear left lung base opacity compatible with atelectasis. No focal consolidation. No pleural effusion or pneumothorax. Mild multilevel degenerative changes of the spine. IMPRESSION: Left central venous catheter tip projects over the cavoatrial junction. Electronically Signed   By: Kristine Garbe M.D.   On: 08/07/2017 19:47   Mr Cervical Spine Wo Contrast  Result Date: 08/03/2017 CLINICAL DATA:  Fever,  neck pain EXAM: MRI CERVICAL SPINE WITHOUT CONTRAST TECHNIQUE: Multiplanar, multisequence MR imaging of the cervical spine was performed. No intravenous contrast was administered. COMPARISON:  None. FINDINGS: Alignment: Physiologic. Vertebrae: No fracture, evidence of discitis, or bone lesion. Cord: Normal signal and morphology. Posterior Fossa, vertebral arteries, paraspinal tissues: Posterior fossa demonstrates no focal abnormality. Vertebral artery flow voids are maintained. Paraspinal soft tissues are unremarkable. Disc levels: Discs: Disc spaces are maintained. C2-3: No significant disc bulge. No neural foraminal stenosis. No central canal stenosis. C3-4: Mild broad-based disc bulge. Mild left foraminal stenosis. No neural foraminal stenosis. No central canal stenosis. C4-5: Broad-based disc bulge with a small central disc protrusion. No neural foraminal stenosis. Mild spinal stenosis. C5-6: Broad central disc protrusion flattening the ventral cervical spinal cord. Moderate spinal stenosis. Mild bilateral foraminal stenosis. C6-7: Mild broad-based disc bulge. No neural foraminal stenosis. No central canal stenosis. C7-T1: No significant disc bulge. No neural foraminal stenosis. No central canal stenosis. IMPRESSION: 1. At C5-6 there is a broad central disc protrusion flattening the ventral cervical spinal cord. Moderate spinal stenosis. Mild bilateral foraminal stenosis. 2. At C4-5 there is a broad-based disc bulge with a small central disc protrusion. Mild spinal stenosis. Electronically Signed   By: Kathreen Devoid   On: 08/03/2017 13:13    Assessment/Plan 1. Complication of arteriovenous dialysis fistula, sequela Recommend:  The patient has experienced increased symptoms and failure of the fistula to mature.  Duplex ultrasound suggest radial artery disease  Given the severity of the patient's left upper extremity symptoms the patient should undergo left arm angiography and intervention.  This will be  from the femral approach.  Risk and benefits were reviewed the patient.  Indications for the procedure were reviewed.  All questions were answered, the patient agrees to proceed.   The patient should continue walking and begin a more formal exercise program.  The patient should continue antiplatelet therapy and aggressive treatment of the lipid abnormalities  The patient will follow up with me after the angiogram.   2. ESRD on dialysis [facility]) Continue dialysis Tues, Thurs and Sat  3. Essential hypertension Continue antihypertensive medications as already ordered, these medications have been reviewed and there are no changes at this time.     Hortencia Pilar, MD  09/01/2017 12:25 PM

## 2017-09-05 ENCOUNTER — Ambulatory Visit
Admission: RE | Admit: 2017-09-05 | Discharge: 2017-09-05 | Disposition: A | Discharge status: Home / Self Care | Payer: Medicare Other | Source: Ambulatory Visit | Attending: Vascular Surgery | Admitting: Vascular Surgery

## 2017-09-05 ENCOUNTER — Encounter
Admission: RE | Disposition: A | Discharge status: Home / Self Care | Payer: Self-pay | Source: Ambulatory Visit | Attending: Vascular Surgery

## 2017-09-05 DIAGNOSIS — Y832 Surgical operation with anastomosis, bypass or graft as the cause of abnormal reaction of the patient, or of later complication, without mention of misadventure at the time of the procedure: Secondary | ICD-10-CM | POA: Insufficient documentation

## 2017-09-05 DIAGNOSIS — N186 End stage renal disease: Secondary | ICD-10-CM | POA: Insufficient documentation

## 2017-09-05 DIAGNOSIS — E785 Hyperlipidemia, unspecified: Secondary | ICD-10-CM | POA: Insufficient documentation

## 2017-09-05 DIAGNOSIS — Z992 Dependence on renal dialysis: Secondary | ICD-10-CM | POA: Insufficient documentation

## 2017-09-05 DIAGNOSIS — T82898A Other specified complication of vascular prosthetic devices, implants and grafts, initial encounter: Secondary | ICD-10-CM | POA: Diagnosis present

## 2017-09-05 DIAGNOSIS — I120 Hypertensive chronic kidney disease with stage 5 chronic kidney disease or end stage renal disease: Secondary | ICD-10-CM | POA: Diagnosis not present

## 2017-09-05 DIAGNOSIS — T82868A Thrombosis of vascular prosthetic devices, implants and grafts, initial encounter: Secondary | ICD-10-CM | POA: Diagnosis not present

## 2017-09-05 HISTORY — PX: UPPER EXTREMITY ANGIOGRAPHY: CATH118270

## 2017-09-05 LAB — GLUCOSE, CAPILLARY
GLUCOSE-CAPILLARY: 67 mg/dL (ref 65–99)
GLUCOSE-CAPILLARY: 101 mg/dL — AB (ref 65–99)
Glucose-Capillary: 65 mg/dL (ref 65–99)
Glucose-Capillary: 99 mg/dL (ref 65–99)

## 2017-09-05 LAB — POTASSIUM (ARMC VASCULAR LAB ONLY): POTASSIUM (ARMC VASCULAR LAB): 4.9 (ref 3.5–5.1)

## 2017-09-05 SURGERY — UPPER EXTREMITY ANGIOGRAPHY
Anesthesia: Moderate Sedation | Laterality: Left

## 2017-09-05 MED ORDER — IOPAMIDOL (ISOVUE-300) INJECTION 61%
INTRAVENOUS | Status: DC | PRN
  Administered 2017-09-05: 62 mL via INTRA_ARTERIAL

## 2017-09-05 MED ORDER — HYDRALAZINE HCL 20 MG/ML IJ SOLN: 5.0000 mg | INTRAMUSCULAR | Status: DC | PRN

## 2017-09-05 MED ORDER — MORPHINE SULFATE (PF) 4 MG/ML IV SOLN: 2.0000 mg | INTRAVENOUS | Status: DC | PRN

## 2017-09-05 MED ORDER — OXYCODONE HCL 5 MG PO TABS: 5.0000 mg | ORAL_TABLET | ORAL | Status: DC | PRN

## 2017-09-05 MED ORDER — SODIUM CHLORIDE 0.9 % IV SOLN: 250.0000 mL | INTRAVENOUS | Status: DC | PRN

## 2017-09-05 MED ORDER — HEPARIN SODIUM (PORCINE) 1000 UNIT/ML IJ SOLN
INTRAMUSCULAR | Status: DC | PRN
  Administered 2017-09-05: 4000 [IU] via INTRAVENOUS

## 2017-09-05 MED ORDER — DEXTROSE 50 % IV SOLN: 25.0000 mL | Freq: Once | INTRAVENOUS | Status: DC

## 2017-09-05 MED ORDER — MIDAZOLAM HCL 2 MG/2ML IJ SOLN
INTRAMUSCULAR | Status: DC | PRN
  Administered 2017-09-05: 2 mg via INTRAVENOUS
  Administered 2017-09-05: 1 mg via INTRAVENOUS

## 2017-09-05 MED ORDER — DEXTROSE 50 % IV SOLN
INTRAVENOUS | Status: AC
  Filled 2017-09-05: qty 50

## 2017-09-05 MED ORDER — SODIUM CHLORIDE 0.9% FLUSH: 3.0000 mL | Freq: Two times a day (BID) | INTRAVENOUS | Status: DC

## 2017-09-05 MED ORDER — SODIUM CHLORIDE 0.9% FLUSH: 3.0000 mL | INTRAVENOUS | Status: DC | PRN

## 2017-09-05 MED ORDER — FENTANYL CITRATE (PF) 100 MCG/2ML IJ SOLN
INTRAMUSCULAR | Status: AC
  Filled 2017-09-05: qty 2

## 2017-09-05 MED ORDER — DIPHENHYDRAMINE HCL 50 MG/ML IJ SOLN
INTRAMUSCULAR | Status: DC | PRN
  Administered 2017-09-05: 25 mg via INTRAVENOUS

## 2017-09-05 MED ORDER — MIDAZOLAM HCL 5 MG/5ML IJ SOLN
INTRAMUSCULAR | Status: AC
  Filled 2017-09-05: qty 5

## 2017-09-05 MED ORDER — METHYLPREDNISOLONE SODIUM SUCC 125 MG IJ SOLR: 125.0000 mg | INTRAMUSCULAR | Status: DC | PRN

## 2017-09-05 MED ORDER — CEFAZOLIN SODIUM-DEXTROSE 1-4 GM/50ML-% IV SOLN
1.0000 g | Freq: Once | INTRAVENOUS | Status: AC
  Administered 2017-09-05: 1 g via INTRAVENOUS

## 2017-09-05 MED ORDER — FENTANYL CITRATE (PF) 100 MCG/2ML IJ SOLN
INTRAMUSCULAR | Status: DC | PRN
  Administered 2017-09-05 (×2): 50 ug via INTRAVENOUS

## 2017-09-05 MED ORDER — LIDOCAINE HCL (PF) 1 % IJ SOLN
INTRAMUSCULAR | Status: AC
Start: 2017-09-05 — End: 2017-09-05
  Filled 2017-09-05: qty 30

## 2017-09-05 MED ORDER — HEPARIN SODIUM (PORCINE) 1000 UNIT/ML IJ SOLN
INTRAMUSCULAR | Status: AC
  Filled 2017-09-05: qty 1

## 2017-09-05 MED ORDER — LABETALOL HCL 5 MG/ML IV SOLN: 10.0000 mg | INTRAVENOUS | Status: DC | PRN

## 2017-09-05 MED ORDER — FAMOTIDINE 20 MG PO TABS: 40.0000 mg | ORAL_TABLET | ORAL | Status: DC | PRN

## 2017-09-05 MED ORDER — SODIUM CHLORIDE 0.9 % IV SOLN
INTRAVENOUS | Status: DC
  Administered 2017-09-05: 1000 mL via INTRAVENOUS

## 2017-09-05 MED ORDER — CEFAZOLIN SODIUM-DEXTROSE 1-4 GM/50ML-% IV SOLN
INTRAVENOUS | Status: AC
  Administered 2017-09-05: 1 g via INTRAVENOUS
  Filled 2017-09-05: qty 50

## 2017-09-05 MED ORDER — DIPHENHYDRAMINE HCL 50 MG/ML IJ SOLN
INTRAMUSCULAR | Status: AC
  Filled 2017-09-05: qty 1

## 2017-09-05 SURGICAL SUPPLY — 25 items
BALLN ULTRVRSE 3X40X130C (BALLOONS) ×3
BALLN ULTRVRSE 3X40X150 (BALLOONS) ×2
BALLN ULTRVRSE 3X40X150 OTW (BALLOONS) ×1
BALLOON ULTRVRSE 3X40X130C (BALLOONS) ×1 IMPLANT
BALLOON ULTRVRSE 3X40X150 OTW (BALLOONS) ×1 IMPLANT
CATH ANGIO 5F 100CM .035 PIG (CATHETERS) ×3 IMPLANT
CATH BEACON 5 .035 100 H1 TIP (CATHETERS) ×3 IMPLANT
CATH CXI SUPP ANG 2.6FR 150CM (MICROCATHETER) ×3 IMPLANT
CATH GWIRE MARINER STRGHT 4FR (CATHETERS) ×3 IMPLANT
CATH PIG 70CM (CATHETERS) IMPLANT
DEVICE PRESTO INFLATION (MISCELLANEOUS) ×3 IMPLANT
DEVICE STARCLOSE SE CLOSURE (Vascular Products) ×3 IMPLANT
DEVICE TORQUE (MISCELLANEOUS) ×3 IMPLANT
GLIDEWIRE ANGLED SS 035X260CM (WIRE) ×3 IMPLANT
NEEDLE ENTRY 21GA 7CM ECHOTIP (NEEDLE) ×3 IMPLANT
PACK ANGIOGRAPHY (CUSTOM PROCEDURE TRAY) ×3 IMPLANT
SET INTRO CAPELLA COAXIAL (SET/KITS/TRAYS/PACK) ×3 IMPLANT
SHEATH BRITE TIP 5FRX11 (SHEATH) ×3 IMPLANT
SHEATH BRITE TIP 6FRX11 (SHEATH) ×3 IMPLANT
SHEATH GUIDING CAROTID 6FRX90 (SHEATH) ×3 IMPLANT
TUBING CONTRAST HIGH PRESS 72 (TUBING) ×3 IMPLANT
VALVE CHECKFLO PERFORMER (SHEATH) ×3 IMPLANT
WIRE G V18X300CM (WIRE) ×3 IMPLANT
WIRE HI TORQ VERSACORE 300 (WIRE) ×3 IMPLANT
WIRE J 3MM .035X145CM (WIRE) ×3 IMPLANT

## 2017-09-05 NOTE — Progress Notes (Signed)
BG: 97!. Pt. Med. With 1/2 amp D50W IVP now. Pt. States "I feel a little funny" per Spanish interpretor.

## 2017-09-05 NOTE — Op Note (Signed)
[facility] Percutaneous Study/Intervention Procedural Note   Date: 11/05/2016  Surgeon(s): Hortencia Pilar, MD  Assistants: none  Pre-operative Diagnosis:  1.  Complication of dialysis access with failure of fistula to mature 2. 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  End-stage renal disease requiring hemodialysis   Post-operative diagnosis:  1.  Complication of dialysis access with failure of fistula to mature 2. 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  End-stage renal disease requiring hemodialysis  Procedure(s) Performed: 1. Ultrasound guidance for vascular access right femoral artery femoral artery 2. Catheter placement into left radial artery from right femoral approach 3. Aortogram and selective angiogram of the left arm third order catheter placement  4.  Percutaneous transluminal angioplasty of the left radiocephalic fistula at the level of the anastomosis  5. StarClose closure device right femoral artery   Anesthesia: Conscious sedation was administered under my direct supervision by the interventional radiology RN.  IV Versed plus fentanyl were utilized. Continuous ECG, pulse oximetry and blood pressure was monitored throughout the entire procedure.  Versed and fentanyl were administered intravenously.  Conscious sedation was administered for a total of 93 minutes.   Contrast: 62  Fluoro time: 9.8   Indications: Patient is a 76 y.o. male who has symptoms consistent with mesenteric ischemia. 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 She has a large discrepancy in her blood pressures between the right arm and the left arm with the left arm obviously being significantly lower. She also notes she has begun having syncopal episodes and is very concerned because one of these occurred while she  was driving. The patient is brought in for angiography for further evaluation and potential treatment. Risks and benefits are discussed and informed consent is obtained  Procedure: The patient was identified and appropriate procedural time out was performed. The patient was then placed supine on the table and prepped and draped in the usual sterile fashion. Ultrasound was used to evaluate the right common femoral artery. It was patent. A digital ultrasound image was acquired. A micropuncture needle was used to access the right common femoral artery under direct ultrasound guidance and a permanent image was performed. A microwire was then advanced easily under fluoroscopic guidance and a micro-sheath inserted. A 0.035 J wire was advanced without resistance and a 5Fr sheath was placed. Pigtail catheter was placed into the ascending aorta and an LAO projection of the arch was performed. 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 The innominate and left carotid were widely patent arch was otherwise normal.   The patient was given 4000 units of IV heparin.We upsized to a 6 Fr sheath 90 cm Terumo.  A H1 catheter was used to selectively cannulate the subclavian and the catheter was advanced initially to the mid subclavian where hand injection contrast was used to demonstrate the distal subclavian and axillary arteries and then the catheter was advanced over the wire into the brachial artery were distal runoff was complete. This demonstrated a greater than 90% stenosis of the origin of the left radiocephalic fistula.  The arterial system proper of the left arm is widely patent with minimal atherosclerotic changes noted.   Based on the failure of the fistula to mature I elected to treat the left fistula stenosis to try to improve the patient's clinical course and obtain a fistula which is fully matured. I advanced the wire and  a 150 cm straight glide catheter into the radial  artery where magnified imaging of the anastomotic region was obtained. Wire and catheter were then used to crossed the lesion without difficulty. I then exchanged for a 0.018 wire. I used a 3 mm diameter x 40 mm length all traverse angioplasty balloon and inflated the balloon to 10 atm for one minute.  Completion angiogram demonstrated moderate improvement but the area was still undersized, so I elected to proceed with second angioplasty using a 4 mm x 40 mm ultra versed balloon.   On completion angiogram following this, less than 10 % residual stenosis was identified. At this point, I elected to terminate the procedure. The diagnostic catheter was removed. StarClose closure device was deployed in usual fashion with excellent hemostatic result. The patient was taken to the recovery room in stable condition having tolerated the procedure well.     Findings:Aortic arch demonstrates a type I arch. No evidence of ulcerations or other abnormalities of the arch. Great vessels are widely patent in their origins and visualized segments. The mid and distal subclavian and axillary brachial arteries and the trifurcation are all widely patent on the left.  Stricture of the left radiocephalic fistula was noted at the level of the anastomosis. As noted above the arterial system of the left upper extremity strength minimal atherosclerotic changes without any hemodynamically significant lesions.  Following angioplasty there is now less than 10% residual stenosis at the origin of the fistula.   Disposition: Patient is status post successful intervention left AV fistula. Patient was taken to the recovery room in stable condition having tolerated the procedure well.  Complications: None  Hortencia Pilar 11/05/2016 10:45 AM  This note was created with Dragon Medical transcription system. Any errors in dictation are purely unintentional.

## 2017-09-05 NOTE — H&P (Signed)
 [facility] History & Physical Update  The patient was interviewed and re-examined.  The patient's previous History and Physical has been reviewed and is unchanged.  There is no change in the plan of care. We plan to proceed with the scheduled procedure.  Hortencia Pilar, MD  09/05/2017, 11:51 AM

## 2017-09-08 ENCOUNTER — Encounter: Payer: Self-pay | Admitting: Vascular Surgery

## 2017-09-08 ENCOUNTER — Other Ambulatory Visit: Payer: Self-pay | Admitting: Family Medicine

## 2017-09-08 DIAGNOSIS — N644 Mastodynia: Secondary | ICD-10-CM

## 2017-09-18 ENCOUNTER — Other Ambulatory Visit: Payer: Medicare Other

## 2017-10-13 ENCOUNTER — Other Ambulatory Visit (INDEPENDENT_AMBULATORY_CARE_PROVIDER_SITE_OTHER): Payer: Medicare Other

## 2017-10-13 ENCOUNTER — Other Ambulatory Visit (INDEPENDENT_AMBULATORY_CARE_PROVIDER_SITE_OTHER): Payer: Self-pay | Admitting: Vascular Surgery

## 2017-10-13 ENCOUNTER — Ambulatory Visit (INDEPENDENT_AMBULATORY_CARE_PROVIDER_SITE_OTHER): Payer: Medicare Other | Admitting: Vascular Surgery

## 2017-10-13 ENCOUNTER — Encounter (INDEPENDENT_AMBULATORY_CARE_PROVIDER_SITE_OTHER): Payer: Self-pay | Admitting: Vascular Surgery

## 2017-10-13 VITALS — BP 110/52 | HR 60 | Resp 16 | Wt 172.8 lb

## 2017-10-13 DIAGNOSIS — Z992 Dependence on renal dialysis: Secondary | ICD-10-CM

## 2017-10-13 DIAGNOSIS — N186 End stage renal disease: Secondary | ICD-10-CM | POA: Diagnosis not present

## 2017-10-13 DIAGNOSIS — I10 Essential (primary) hypertension: Secondary | ICD-10-CM

## 2017-10-13 DIAGNOSIS — T829XXS Unspecified complication of cardiac and vascular prosthetic device, implant and graft, sequela: Secondary | ICD-10-CM

## 2017-10-15 NOTE — Progress Notes (Signed)
MRN : [id number]  Martin Gilbert is a 76 y.o. (Oct 16, 1941) male who presents with chief complaint of  Chief Complaint  Patient presents with  . Follow-up    2 week hda  .  History of Present Illness: The patient returns to the office for followup of their dialysis access. The function of the access has been stable. The patient denies increased bleeding time or increased recirculation. Patient denies difficulty with cannulation. The patient denies hand pain or other symptoms consistent with steal phenomena.  No significant arm swelling.  The patient denies redness or swelling at the access site. The patient denies fever or chills at home or while on dialysis.  The patient denies amaurosis fugax or recent TIA symptoms. There are no recent neurological changes noted. The patient denies claudication symptoms or rest pain symptoms. The patient denies history of DVT, PE or superficial thrombophlebitis. The patient denies recent episodes of angina or shortness of breath.   Duplex ultrasound of the AV access shows a patent access with uniform velocities.  No focal hemodynamically significant stenosis.    Current Meds  Medication Sig  . allopurinol (ZYLOPRIM) 100 MG tablet Take 100 mg by mouth daily.  Marland Kitchen amLODipine (NORVASC) 2.5 MG tablet Take 2.5 mg by mouth daily.  Marland Kitchen aspirin 81 MG tablet Take 81 mg by mouth daily.  Marland Kitchen atorvastatin (LIPITOR) 20 MG tablet Take 20 mg by mouth daily.  . feeding supplement, GLUCERNA SHAKE, (GLUCERNA SHAKE) LIQD Take 237 mLs by mouth 3 (three) times daily between meals.  . ferrous sulfate 325 (65 FE) MG tablet Take 325 mg by mouth daily with breakfast.  . glimepiride (AMARYL) 2 MG tablet Take 2 mg by mouth 2 (two) times daily.   . hydrALAZINE (APRESOLINE) 50 MG tablet Take 50 mg by mouth 3 (three) times daily.  Marland Kitchen HYDROcodone-acetaminophen (NORCO) 5-325 MG tablet Take 1 tablet by mouth every 6 (six) hours as needed for moderate pain or severe pain.  .  Melatonin 5 MG TABS Take 1 tablet (5 mg total) by mouth at bedtime.  . metoprolol succinate (TOPROL-XL) 25 MG 24 hr tablet Take 25 mg by mouth daily.  . ondansetron (ZOFRAN) 4 MG tablet Take 1 tablet (4 mg total) by mouth every 6 (six) hours as needed for nausea.  . pantoprazole (PROTONIX) 40 MG tablet Take 1 tablet (40 mg total) by mouth daily.  . Polyvinyl Alcohol-Povidone (REFRESH OP) Place 1 drop into both eyes 2 (two) times daily.  . sodium bicarbonate 650 MG tablet Take 650 mg by mouth 2 (two) times daily.  . tamsulosin (FLOMAX) 0.4 MG CAPS capsule Take 1 capsule (0.4 mg total) by mouth daily after supper.  . Vancomycin (VANCOCIN) 750-5 MG/150ML-% SOLN Inject 150 mLs (750 mg total) into the vein Every Tuesday,Thursday,and Saturday with dialysis.  . Vitamin D, Cholecalciferol, 1000 UNITS TABS Take by mouth.   Current Facility-Administered Medications for the 10/13/17 encounter (Office Visit) with Delana Meyer, Dolores Lory, MD  Medication  . vancomycin (VANCOCIN) 1,000 mg in sodium chloride 0.9 % 500 mL IVPB    Past Medical History:  Diagnosis Date  . Anemia   . Back pain   . BPH (benign prostatic hyperplasia)   . CKD (chronic kidney disease), stage IV ([place])   . Clostridium difficile colitis   . Diabetes mellitus without complication ([place])   . GERD (gastroesophageal reflux disease)   . HTN (hypertension)   . Hyperlipidemia     Past Surgical History:  Procedure Laterality Date  .  AV FISTULA PLACEMENT Left 06/13/2017   Procedure: ARTERIOVENOUS (AV) FISTULA CREATION ( RADIAL CEPHALIC );  Surgeon: Katha Cabal, MD;  Location: [facility] ORS;  Service: Vascular;  Laterality: Left;  . CATARACT EXTRACTION     one eye not sure which  . DIALYSIS/PERMA CATHETER INSERTION N/A 04/03/2017   Procedure: Dialysis/Perma Catheter Insertion;  Surgeon: Algernon Huxley, MD;  Location: [place] [facility];  Service: Cardiovascular;  Laterality: N/A;  . DIALYSIS/PERMA CATHETER INSERTION N/A 08/06/2017    Procedure: DIALYSIS/PERMA CATHETER INSERTION;  Surgeon: Algernon Huxley, MD;  Location: [place] [facility];  Service: Cardiovascular;  Laterality: N/A;  . DIALYSIS/PERMA CATHETER REMOVAL N/A 08/04/2017   Procedure: DIALYSIS/PERMA CATHETER REMOVAL;  Surgeon: Algernon Huxley, MD;  Location: [place] [facility];  Service: Cardiovascular;  Laterality: N/A;  . hernia repair    . UPPER EXTREMITY ANGIOGRAPHY Left 09/05/2017   Procedure: Upper Extremity Angiography;  Surgeon: Katha Cabal, MD;  Location: [place] [facility];  Service: Cardiovascular;  Laterality: Left;    Social History Social History  Substance Use Topics  . Smoking status: Never Smoker  . Smokeless tobacco: Never Used  . Alcohol use No     Comment: Drank in the past, but has stopped for several years.     Family History Family History  Problem Relation Age of Onset  . Hypertension Mother   . Diabetes Neg Hx     Allergies  Allergen Reactions  . Aripiprazole     Denial of this listed allergy.     REVIEW OF SYSTEMS (Negative unless checked)  Constitutional: [] Weight loss  [] Fever  [] Chills Cardiac: [] Chest pain   [] Chest pressure   [] Palpitations   [] Shortness of breath when laying flat   [] Shortness of breath with exertion. Vascular:  [] Pain in legs with walking   [] Pain in legs at rest  [] History of DVT   [] Phlebitis   [] Swelling in legs   [] Varicose veins   [] Non-healing ulcers Pulmonary:   [] Uses home oxygen   [] Productive cough   [] Hemoptysis   [] Wheeze  [] COPD   [] Asthma Neurologic:  [] Dizziness   [] Seizures   [] History of stroke   [] History of TIA  [] Aphasia   [] Vissual changes   [] Weakness or numbness in arm   [] Weakness or numbness in leg Musculoskeletal:   [] Joint swelling   [] Joint pain   [] Low back pain Hematologic:  [] Easy bruising  [] Easy bleeding   [] Hypercoagulable state   [] Anemic Gastrointestinal:  [] Diarrhea   [] Vomiting  [] Gastroesophageal reflux/heartburn   [] Difficulty  swallowing. Genitourinary:  [x] Chronic kidney disease   [] Difficult urination  [] Frequent urination   [] Blood in urine Skin:  [] Rashes   [] Ulcers  Psychological:  [] History of anxiety   []  History of major depression.  Physical Examination  Vitals:   10/13/17 1130  BP: (!) 110/52  Pulse: 60  Resp: 16  Weight: 172 lb 12.8 oz (78.4 kg)   Body mass index is 27.06 kg/m. Gen: WD/WN, NAD Head: [place]/AT, No temporalis wasting.  Ear/Nose/Throat: Hearing grossly intact, nares w/o erythema or drainage Eyes: PER, EOMI, sclera nonicteric.  Neck: Supple, no large masses.   Pulmonary:  Good air movement, no audible wheezing bilaterally, no use of accessory muscles.  Cardiac: RRR, no JVD Vascular: left wrist fistula good thrill good bruit, cephalic vein is easily visible but on the smaller size Vessel Right Left  Radial Palpable Palpable  Ulnar Palpable Palpable  Brachial Palpable Palpable  Carotid Palpable Palpable  Gastrointestinal: Non-distended.  No guarding/no peritoneal signs.  Musculoskeletal: M/S 5/5 throughout.  No deformity or atrophy.  Neurologic: CN 2-12 intact. Symmetrical.  Speech is fluent. Motor exam as listed above. Psychiatric: Judgment intact, Mood & affect appropriate for pt's clinical situation. Dermatologic: No rashes or ulcers noted.  No changes consistent with cellulitis. Lymph : No lichenification or skin changes of chronic lymphedema.  CBC Lab Results  Component Value Date   WBC 3.7 (L) 08/07/2017   HGB 11.6 (L) 08/07/2017   HCT 34.2 (L) 08/07/2017   MCV 92.5 08/07/2017   PLT 228 08/07/2017    BMET    Component Value Date/Time   NA 135 08/07/2017 1953   NA 138 04/08/2015 2133   K 3.7 08/07/2017 1953   K 4.0 04/08/2015 2133   CL 98 (L) 08/07/2017 1953   CL 108 04/08/2015 2133   CO2 26 08/07/2017 1953   CO2 25 04/08/2015 2133   GLUCOSE 121 (H) 08/07/2017 1953   GLUCOSE 181 (H) 04/08/2015 2133   BUN 29 (H) 08/07/2017 1953   BUN 60 (H) 04/08/2015 2133    CREATININE 4.23 (H) 08/07/2017 1953   CREATININE 4.04 (H) 04/08/2015 2133   CALCIUM 8.7 (L) 08/07/2017 1953   CALCIUM 8.5 (L) 04/08/2015 2133   GFRNONAA 12 (L) 08/07/2017 1953   GFRNONAA 14 (L) 04/08/2015 2133   GFRAA 14 (L) 08/07/2017 1953   GFRAA 16 (L) 04/08/2015 2133   CrCl cannot be calculated (Patient's most recent lab result is older than the maximum 21 days allowed.).  COAG Lab Results  Component Value Date   INR 1.05 06/04/2017   INR 1.35 03/27/2017   INR 1.0 12/28/2012    Radiology No results found.   Assessment/Plan 1. ESRD on dialysis [facility]) Recommend:  The patient is doing well and currently has adequate dialysis access. The patient's dialysis center is not reporting any access issues. Flow pattern is stable when compared to the prior ultrasound.  OK to begin to access the fistula.  He will follow up in 3 months no studies  The patient should have a duplex ultrasound of the dialysis access in 6 months.  The patient will follow-up with me in the office after each ultrasound    2. Complication of arteriovenous dialysis fistula, sequela See #1  3. Essential hypertension Continue antihypertensive medications as already ordered, these medications have been reviewed and there are no changes at this time.     Hortencia Pilar, MD  10/15/2017 11:06 AM

## 2017-10-16 ENCOUNTER — Other Ambulatory Visit: Payer: Medicare Other

## 2017-11-25 ENCOUNTER — Emergency Department: Payer: Medicare Other

## 2017-11-25 ENCOUNTER — Other Ambulatory Visit: Payer: Self-pay

## 2017-11-25 ENCOUNTER — Inpatient Hospital Stay
Admission: EM | Admit: 2017-11-25 | Discharge: 2017-11-28 | DRG: 871 | Disposition: A | Discharge status: Home Health | Payer: Medicare Other | Attending: Internal Medicine | Admitting: Internal Medicine

## 2017-11-25 ENCOUNTER — Emergency Department
Admission: EM | Admit: 2017-11-25 | Discharge: 2017-11-25 | Disposition: A | Discharge status: Home / Self Care | Payer: Medicare Other | Source: Home / Self Care | Attending: Emergency Medicine | Admitting: Emergency Medicine

## 2017-11-25 ENCOUNTER — Encounter: Payer: Self-pay | Admitting: Emergency Medicine

## 2017-11-25 DIAGNOSIS — N3090 Cystitis, unspecified without hematuria: Secondary | ICD-10-CM

## 2017-11-25 DIAGNOSIS — K219 Gastro-esophageal reflux disease without esophagitis: Secondary | ICD-10-CM | POA: Diagnosis present

## 2017-11-25 DIAGNOSIS — N400 Enlarged prostate without lower urinary tract symptoms: Secondary | ICD-10-CM | POA: Diagnosis present

## 2017-11-25 DIAGNOSIS — N3001 Acute cystitis with hematuria: Secondary | ICD-10-CM

## 2017-11-25 DIAGNOSIS — D631 Anemia in chronic kidney disease: Secondary | ICD-10-CM | POA: Diagnosis present

## 2017-11-25 DIAGNOSIS — Z992 Dependence on renal dialysis: Secondary | ICD-10-CM | POA: Diagnosis not present

## 2017-11-25 DIAGNOSIS — I1311 Hypertensive heart and chronic kidney disease without heart failure, with stage 5 chronic kidney disease, or end stage renal disease: Secondary | ICD-10-CM | POA: Diagnosis present

## 2017-11-25 DIAGNOSIS — Z79899 Other long term (current) drug therapy: Secondary | ICD-10-CM

## 2017-11-25 DIAGNOSIS — R6521 Severe sepsis with septic shock: Secondary | ICD-10-CM | POA: Diagnosis present

## 2017-11-25 DIAGNOSIS — N186 End stage renal disease: Secondary | ICD-10-CM

## 2017-11-25 DIAGNOSIS — N2581 Secondary hyperparathyroidism of renal origin: Secondary | ICD-10-CM | POA: Diagnosis not present

## 2017-11-25 DIAGNOSIS — Z7982 Long term (current) use of aspirin: Secondary | ICD-10-CM | POA: Diagnosis not present

## 2017-11-25 DIAGNOSIS — A419 Sepsis, unspecified organism: Secondary | ICD-10-CM | POA: Diagnosis present

## 2017-11-25 DIAGNOSIS — I120 Hypertensive chronic kidney disease with stage 5 chronic kidney disease or end stage renal disease: Secondary | ICD-10-CM

## 2017-11-25 DIAGNOSIS — R509 Fever, unspecified: Secondary | ICD-10-CM

## 2017-11-25 DIAGNOSIS — Z8249 Family history of ischemic heart disease and other diseases of the circulatory system: Secondary | ICD-10-CM

## 2017-11-25 DIAGNOSIS — E785 Hyperlipidemia, unspecified: Secondary | ICD-10-CM | POA: Diagnosis present

## 2017-11-25 DIAGNOSIS — I959 Hypotension, unspecified: Secondary | ICD-10-CM | POA: Diagnosis not present

## 2017-11-25 DIAGNOSIS — E1122 Type 2 diabetes mellitus with diabetic chronic kidney disease: Secondary | ICD-10-CM | POA: Diagnosis present

## 2017-11-25 LAB — URINALYSIS, COMPLETE (UACMP) WITH MICROSCOPIC
Bacteria, UA: NONE SEEN
Bilirubin Urine: NEGATIVE
GLUCOSE, UA: NEGATIVE mg/dL
Hgb urine dipstick: NEGATIVE
Ketones, ur: NEGATIVE mg/dL
NITRITE: NEGATIVE
PH: 6 (ref 5.0–8.0)
Protein, ur: 100 mg/dL — AB
Specific Gravity, Urine: 1.014 (ref 1.005–1.030)

## 2017-11-25 LAB — CBC
HCT: 32.8 % — ABNORMAL LOW (ref 40.0–52.0)
HEMOGLOBIN: 11 g/dL — AB (ref 13.0–18.0)
MCH: 31.5 pg (ref 26.0–34.0)
MCHC: 33.5 g/dL (ref 32.0–36.0)
MCV: 93.9 fL (ref 80.0–100.0)
Platelets: 158 10*3/uL (ref 150–440)
RBC: 3.49 MIL/uL — ABNORMAL LOW (ref 4.40–5.90)
RDW: 14 % (ref 11.5–14.5)
WBC: 7.9 10*3/uL (ref 3.8–10.6)

## 2017-11-25 LAB — LACTIC ACID, PLASMA
Lactic Acid, Venous: 1.1 mmol/L (ref 0.5–1.9)
Lactic Acid, Venous: 3.1 mmol/L (ref 0.5–1.9)
Lactic Acid, Venous: 1.4 mmol/L (ref 0.5–1.9)

## 2017-11-25 LAB — BASIC METABOLIC PANEL
ANION GAP: 16 — AB (ref 5–15)
BUN: 95 mg/dL — ABNORMAL HIGH (ref 6–20)
CHLORIDE: 98 mmol/L — AB (ref 101–111)
CO2: 20 mmol/L — AB (ref 22–32)
Calcium: 8.9 mg/dL (ref 8.9–10.3)
Creatinine, Ser: 11.02 mg/dL — ABNORMAL HIGH (ref 0.61–1.24)
GFR calc Af Amer: 4 mL/min — ABNORMAL LOW (ref >60)
GFR calc non Af Amer: 4 mL/min — ABNORMAL LOW (ref >60)
GLUCOSE: 145 mg/dL — AB (ref 65–99)
POTASSIUM: 4.3 mmol/L (ref 3.5–5.1)
Sodium: 134 mmol/L — ABNORMAL LOW (ref 135–145)

## 2017-11-25 LAB — CBC WITH DIFFERENTIAL/PLATELET
Basophils Absolute: 0 10*3/uL (ref 0–0.1)
Basophils Relative: 0 %
EOS ABS: 0 10*3/uL (ref 0–0.7)
EOS PCT: 0 %
HCT: 38.1 % — ABNORMAL LOW (ref 40.0–52.0)
Hemoglobin: 12.9 g/dL — ABNORMAL LOW (ref 13.0–18.0)
LYMPHS ABS: 0.6 10*3/uL — AB (ref 1.0–3.6)
LYMPHS PCT: 8 %
MCH: 31.7 pg (ref 26.0–34.0)
MCHC: 33.9 g/dL (ref 32.0–36.0)
MCV: 93.7 fL (ref 80.0–100.0)
MONO ABS: 0 10*3/uL — AB (ref 0.2–1.0)
Monocytes Relative: 0 %
Neutro Abs: 6.9 10*3/uL — ABNORMAL HIGH (ref 1.4–6.5)
Neutrophils Relative %: 92 %
PLATELETS: 133 10*3/uL — AB (ref 150–440)
RBC: 4.07 MIL/uL — ABNORMAL LOW (ref 4.40–5.90)
RDW: 14.1 % (ref 11.5–14.5)
WBC: 7.5 10*3/uL (ref 3.8–10.6)

## 2017-11-25 LAB — DIFFERENTIAL
Basophils Absolute: 0 10*3/uL (ref 0–0.1)
Basophils Relative: 0 %
Eosinophils Absolute: 0 10*3/uL (ref 0–0.7)
Eosinophils Relative: 0 %
LYMPHS PCT: 18 %
Lymphs Abs: 1.4 10*3/uL (ref 1.0–3.6)
MONO ABS: 0.7 10*3/uL (ref 0.2–1.0)
MONOS PCT: 9 %
NEUTROS ABS: 5.7 10*3/uL (ref 1.4–6.5)
Neutrophils Relative %: 73 %

## 2017-11-25 LAB — PROTIME-INR
INR: 1.05
Prothrombin Time: 13.6 seconds (ref 11.4–15.2)

## 2017-11-25 LAB — COMPREHENSIVE METABOLIC PANEL
ALBUMIN: 3.5 g/dL (ref 3.5–5.0)
ALK PHOS: 487 U/L — AB (ref 38–126)
ALT: 51 U/L (ref 17–63)
AST: 88 U/L — ABNORMAL HIGH (ref 15–41)
Anion gap: 17 — ABNORMAL HIGH (ref 5–15)
BILIRUBIN TOTAL: 2 mg/dL — AB (ref 0.3–1.2)
BUN: 62 mg/dL — ABNORMAL HIGH (ref 6–20)
CALCIUM: 9 mg/dL (ref 8.9–10.3)
CO2: 24 mmol/L (ref 22–32)
CREATININE: 8.1 mg/dL — AB (ref 0.61–1.24)
Chloride: 95 mmol/L — ABNORMAL LOW (ref 101–111)
GFR calc non Af Amer: 6 mL/min — ABNORMAL LOW (ref >60)
GFR, EST AFRICAN AMERICAN: 7 mL/min — AB (ref >60)
GLUCOSE: 143 mg/dL — AB (ref 65–99)
Potassium: 3.5 mmol/L (ref 3.5–5.1)
SODIUM: 136 mmol/L (ref 135–145)
TOTAL PROTEIN: 8.4 g/dL — AB (ref 6.5–8.1)

## 2017-11-25 LAB — TROPONIN I: Troponin I: 0.13 ng/mL (ref <0.03)

## 2017-11-25 LAB — GLUCOSE, CAPILLARY
GLUCOSE-CAPILLARY: 163 mg/dL — AB (ref 65–99)
Glucose-Capillary: 154 mg/dL — ABNORMAL HIGH (ref 65–99)

## 2017-11-25 LAB — PROCALCITONIN: PROCALCITONIN: 8.43 ng/mL

## 2017-11-25 LAB — LIPASE, BLOOD: Lipase: 32 U/L (ref 11–51)

## 2017-11-25 LAB — APTT: aPTT: 42 seconds — ABNORMAL HIGH (ref 24–36)

## 2017-11-25 MED ORDER — METOPROLOL SUCCINATE ER 25 MG PO TB24: 25.0000 mg | ORAL_TABLET | Freq: Every day | ORAL | Status: DC

## 2017-11-25 MED ORDER — ONDANSETRON HCL 4 MG PO TABS: 4.0000 mg | ORAL_TABLET | Freq: Four times a day (QID) | ORAL | Status: DC | PRN

## 2017-11-25 MED ORDER — DEXTROSE 5 % IV SOLN: 500.0000 mg | INTRAVENOUS | Status: DC

## 2017-11-25 MED ORDER — PANTOPRAZOLE SODIUM 40 MG PO TBEC
40.0000 mg | DELAYED_RELEASE_TABLET | Freq: Every day | ORAL | Status: DC
  Administered 2017-11-25 – 2017-11-28 (×4): 40 mg via ORAL
  Filled 2017-11-25 (×4): qty 1

## 2017-11-25 MED ORDER — ONDANSETRON HCL 4 MG/2ML IJ SOLN: 4.0000 mg | Freq: Four times a day (QID) | INTRAMUSCULAR | Status: DC | PRN

## 2017-11-25 MED ORDER — SODIUM CHLORIDE 0.9 % IV BOLUS (SEPSIS)
1000.0000 mL | Freq: Once | INTRAVENOUS | Status: AC
  Administered 2017-11-25: 1000 mL via INTRAVENOUS

## 2017-11-25 MED ORDER — VITAMIN D 1000 UNITS PO TABS
1000.0000 [IU] | ORAL_TABLET | Freq: Every day | ORAL | Status: DC
  Administered 2017-11-25 – 2017-11-28 (×4): 1000 [IU] via ORAL
  Filled 2017-11-25 (×4): qty 1

## 2017-11-25 MED ORDER — ENOXAPARIN SODIUM 40 MG/0.4ML ~~LOC~~ SOLN: 40.0000 mg | SUBCUTANEOUS | Status: DC

## 2017-11-25 MED ORDER — DEXTROSE 5 % IV SOLN
2.0000 g | Freq: Once | INTRAVENOUS | Status: AC
  Administered 2017-11-25: 2 g via INTRAVENOUS
  Filled 2017-11-25: qty 2

## 2017-11-25 MED ORDER — SODIUM BICARBONATE 650 MG PO TABS
650.0000 mg | ORAL_TABLET | Freq: Two times a day (BID) | ORAL | Status: DC
  Administered 2017-11-25 – 2017-11-28 (×6): 650 mg via ORAL
  Filled 2017-11-25 (×7): qty 1

## 2017-11-25 MED ORDER — SODIUM CHLORIDE 0.9 % IV SOLN: 250.0000 mL | INTRAVENOUS | Status: DC | PRN

## 2017-11-25 MED ORDER — MELATONIN 5 MG PO TABS
5.0000 mg | ORAL_TABLET | Freq: Every day | ORAL | Status: DC
  Administered 2017-11-25 – 2017-11-27 (×3): 5 mg via ORAL
  Filled 2017-11-25 (×4): qty 1

## 2017-11-25 MED ORDER — ASPIRIN EC 81 MG PO TBEC
81.0000 mg | DELAYED_RELEASE_TABLET | Freq: Every day | ORAL | Status: DC
  Administered 2017-11-25 – 2017-11-28 (×4): 81 mg via ORAL
  Filled 2017-11-25 (×5): qty 1

## 2017-11-25 MED ORDER — ACETAMINOPHEN 650 MG RE SUPP: 650.0000 mg | Freq: Four times a day (QID) | RECTAL | Status: DC | PRN

## 2017-11-25 MED ORDER — CEFTRIAXONE SODIUM IN DEXTROSE 20 MG/ML IV SOLN
1.0000 g | Freq: Once | INTRAVENOUS | Status: AC
  Administered 2017-11-25: 1 g via INTRAVENOUS
  Filled 2017-11-25: qty 50

## 2017-11-25 MED ORDER — ACETAMINOPHEN 325 MG PO TABS
650.0000 mg | ORAL_TABLET | Freq: Four times a day (QID) | ORAL | Status: DC | PRN
  Administered 2017-11-25 – 2017-11-26 (×2): 650 mg via ORAL
  Filled 2017-11-25 (×2): qty 2

## 2017-11-25 MED ORDER — POLYVINYL ALCOHOL 1.4 % OP SOLN
2.0000 [drp] | Freq: Two times a day (BID) | OPHTHALMIC | Status: DC
  Administered 2017-11-25 – 2017-11-28 (×6): 2 [drp] via OPHTHALMIC
  Filled 2017-11-25 (×2): qty 15

## 2017-11-25 MED ORDER — SODIUM CHLORIDE 0.9 % IV BOLUS (SEPSIS)
500.0000 mL | Freq: Once | INTRAVENOUS | Status: AC
  Administered 2017-11-25: 500 mL via INTRAVENOUS

## 2017-11-25 MED ORDER — GLUCERNA SHAKE PO LIQD
237.0000 mL | Freq: Three times a day (TID) | ORAL | Status: DC
  Administered 2017-11-25 – 2017-11-28 (×6): 237 mL via ORAL

## 2017-11-25 MED ORDER — INSULIN ASPART 100 UNIT/ML ~~LOC~~ SOLN
0.0000 [IU] | Freq: Three times a day (TID) | SUBCUTANEOUS | Status: DC
  Administered 2017-11-26 – 2017-11-27 (×3): 2 [IU] via SUBCUTANEOUS
  Filled 2017-11-25 (×3): qty 1

## 2017-11-25 MED ORDER — ALLOPURINOL 100 MG PO TABS
100.0000 mg | ORAL_TABLET | Freq: Every day | ORAL | Status: DC
  Administered 2017-11-25 – 2017-11-28 (×4): 100 mg via ORAL
  Filled 2017-11-25 (×4): qty 1

## 2017-11-25 MED ORDER — SODIUM CHLORIDE 0.9% FLUSH
3.0000 mL | Freq: Two times a day (BID) | INTRAVENOUS | Status: DC
  Administered 2017-11-25 – 2017-11-28 (×6): 3 mL via INTRAVENOUS

## 2017-11-25 MED ORDER — FERROUS SULFATE 325 (65 FE) MG PO TABS
325.0000 mg | ORAL_TABLET | Freq: Two times a day (BID) | ORAL | Status: DC
  Administered 2017-11-26 – 2017-11-28 (×4): 325 mg via ORAL
  Filled 2017-11-25 (×4): qty 1

## 2017-11-25 MED ORDER — CEPHALEXIN 500 MG PO CAPS: 500.0000 mg | ORAL_CAPSULE | Freq: Three times a day (TID) | ORAL | 0 refills | Status: DC

## 2017-11-25 MED ORDER — ATORVASTATIN CALCIUM 20 MG PO TABS
20.0000 mg | ORAL_TABLET | Freq: Every day | ORAL | Status: DC
  Administered 2017-11-25 – 2017-11-28 (×4): 20 mg via ORAL
  Filled 2017-11-25 (×4): qty 1

## 2017-11-25 MED ORDER — SODIUM CHLORIDE 0.9% FLUSH: 3.0000 mL | INTRAVENOUS | Status: DC | PRN

## 2017-11-25 MED ORDER — HEPARIN SODIUM (PORCINE) 5000 UNIT/ML IJ SOLN
5000.0000 [IU] | Freq: Three times a day (TID) | INTRAMUSCULAR | Status: DC
  Administered 2017-11-25 – 2017-11-28 (×8): 5000 [IU] via SUBCUTANEOUS
  Filled 2017-11-25 (×8): qty 1

## 2017-11-25 MED ORDER — TAMSULOSIN HCL 0.4 MG PO CAPS
0.4000 mg | ORAL_CAPSULE | Freq: Every day | ORAL | Status: DC
  Administered 2017-11-25 – 2017-11-27 (×3): 0.4 mg via ORAL
  Filled 2017-11-25 (×3): qty 1

## 2017-11-25 NOTE — Progress Notes (Signed)
Anticoagulation monitoring(Lovenox):  76 yo male ordered Lovenox 40 mg Q24h  Filed Weights   11/25/17 1715  Weight: 169 lb (76.7 kg)   BMI    Lab Results  Component Value Date   CREATININE 8.10 (H) 11/25/2017   CREATININE 11.02 (H) 11/25/2017   CREATININE 4.23 (H) 08/07/2017   Estimated Creatinine Clearance: 7.4 mL/min (A) (by C-G formula based on SCr of 8.1 mg/dL (H)). Hemoglobin & Hematocrit     Component Value Date/Time   HGB 12.9 (L) 11/25/2017 1716   HGB 9.5 (L) 04/08/2015 2133   HCT 38.1 (L) 11/25/2017 1716   HCT 28.5 (L) 04/08/2015 2133     Per Protocol for Patient with estCrcl < 15 ml/min and BMI < 40, will transition to Heparin 5000 units SQ Q8H mg .

## 2017-11-25 NOTE — ED Notes (Signed)
Pt turned to left side.

## 2017-11-25 NOTE — ED Notes (Signed)
Lactic Acid recollect send down at this time.

## 2017-11-25 NOTE — Discharge Instructions (Signed)

## 2017-11-25 NOTE — ED Triage Notes (Addendum)
Pt in via [facility] from home, per EMS pt with increasing generalized weakness and fever since Saturday, falling this morning, pt denies fall at this time, family request evaluation due to weakness.  Pt with dialysis portacath to left chest.  Pt is spanish speaking.

## 2017-11-25 NOTE — ED Notes (Signed)
Pt was in ED this morning and dx with UTI. Returned febrile and confused.  Generalized weakness. Did received dialysis after leaving ED.  Pt drowsy. Unable to answer most questions.

## 2017-11-25 NOTE — ED Triage Notes (Signed)
Pt arrived ems from home. Seen earlier today dx with UTI. Went to dialysis and then felt weak. Family called and sent back because of weakness and vomiting.

## 2017-11-25 NOTE — ED Notes (Signed)
Pharmacy called for abx 

## 2017-11-25 NOTE — ED Notes (Signed)
NS will be given over 2-2.5 hrs per dr Joni Fears. Pt just had dialysis and fluid removed.

## 2017-11-25 NOTE — H&P (Signed)
[facility] NAME: Martin Gilbert    MR#:  [id number]  DATE OF BIRTH:  [birth year]  DATE OF ADMISSION:  11/25/2017  PRIMARY CARE PHYSICIAN: [name], MD   REQUESTING/REFERRING PHYSICIAN: Brenton Grills MD  CHIEF COMPLAINT:   Chief Complaint  Patient presents with  . Emesis    HISTORY OF PRESENT ILLNESS: Martin Gilbert  is a 76 y.o. male with a known history of end-stage renal disease on hemodialysis Tuesday Thursday and Saturday, essential hypertension, diabetes who was seen earlier today in the emergency room for evaluation of fever.  Patient's daughter who was next to him stated that he went to dialysis 3 days ago and returned home with the fever.  They did not measure his temperature.  They gave him Tylenol.  Patient subsequently started having generalized weakness.  Patient did fall this morning.  He was seen in the emergency room.  And was noted to have a urinary tract infection.  He was doing well therefore he was discharged on Ceftin.  Patient continued to have fevers at home so he is brought into the hospital back with fevers and generalized weakness. PAST MEDICAL HISTORY:   Past Medical History:  Diagnosis Date  . Anemia   . Back pain   . BPH (benign prostatic hyperplasia)   . CKD (chronic kidney disease), stage IV ([place])   . Clostridium difficile colitis   . Diabetes mellitus without complication ([place])   . GERD (gastroesophageal reflux disease)   . HTN (hypertension)   . Hyperlipidemia     PAST SURGICAL HISTORY:  Past Surgical History:  Procedure Laterality Date  . AV FISTULA PLACEMENT Left 06/13/2017   Procedure: ARTERIOVENOUS (AV) FISTULA CREATION ( RADIAL CEPHALIC );  Surgeon: Katha Cabal, MD;  Location: [facility] ORS;  Service: Vascular;  Laterality: Left;  . CATARACT EXTRACTION     one eye not sure which  . DIALYSIS/PERMA CATHETER INSERTION N/A 04/03/2017   Procedure: Dialysis/Perma  Catheter Insertion;  Surgeon: Algernon Huxley, MD;  Location: [place] [facility] LAB;  Service: Cardiovascular;  Laterality: N/A;  . DIALYSIS/PERMA CATHETER INSERTION N/A 08/06/2017   Procedure: DIALYSIS/PERMA CATHETER INSERTION;  Surgeon: Algernon Huxley, MD;  Location: [place] [facility] LAB;  Service: Cardiovascular;  Laterality: N/A;  . DIALYSIS/PERMA CATHETER REMOVAL N/A 08/04/2017   Procedure: DIALYSIS/PERMA CATHETER REMOVAL;  Surgeon: Algernon Huxley, MD;  Location: [place] [facility] LAB;  Service: Cardiovascular;  Laterality: N/A;  . hernia repair    . UPPER EXTREMITY ANGIOGRAPHY Left 09/05/2017   Procedure: Upper Extremity Angiography;  Surgeon: Katha Cabal, MD;  Location: [place] [facility] LAB;  Service: Cardiovascular;  Laterality: Left;    SOCIAL HISTORY:  Social History   Tobacco Use  . Smoking status: Never Smoker  . Smokeless tobacco: Never Used  Substance Use Topics  . Alcohol use: No    Comment: Drank in the past, but has stopped for several years.     FAMILY HISTORY:  Family History  Problem Relation Age of Onset  . Hypertension Mother   . Diabetes Neg Hx     DRUG ALLERGIES: No Known Allergies  REVIEW OF SYSTEMS:   CONSTITUTIONAL: No fever, positive fatigue positive weakness.  EYES: No blurred or double vision.  EARS, NOSE, AND THROAT: No tinnitus or ear pain.  RESPIRATORY: No cough, shortness of breath, wheezing or hemoptysis.  CARDIOVASCULAR: No chest pain, orthopnea, edema.  GASTROINTESTINAL: No nausea, vomiting, diarrhea or abdominal pain.  GENITOURINARY: No dysuria, hematuria.  ENDOCRINE: No polyuria, nocturia,  HEMATOLOGY: No anemia, easy bruising or bleeding SKIN: No rash or lesion. MUSCULOSKELETAL: No joint pain or arthritis.   NEUROLOGIC: No tingling, numbness, weakness.  PSYCHIATRY: No anxiety or depression.   MEDICATIONS AT HOME:  Prior to Admission medications   Medication Sig Start Date End Date Taking? Authorizing Provider  allopurinol (ZYLOPRIM)  100 MG tablet Take 100 mg by mouth daily. 10/17/15   [provider]  aspirin 81 MG tablet Take 81 mg by mouth daily.    [provider]  atorvastatin (LIPITOR) 20 MG tablet Take 20 mg by mouth daily.    [provider]  cephALEXin (KEFLEX) 500 MG capsule Take 1 capsule (500 mg total) by mouth 3 (three) times daily for 7 days. 11/25/17 12/02/17  Rudene Re, MD  feeding supplement, GLUCERNA SHAKE, (GLUCERNA SHAKE) LIQD Take 237 mLs by mouth 3 (three) times daily between meals. Patient not taking: Reported on 11/25/2017 02/02/17   Theodoro Grist, MD  ferrous sulfate 325 (65 FE) MG tablet Take 325 mg by mouth 2 (two) times daily with a meal.  08/30/15   [provider]  glimepiride (AMARYL) 2 MG tablet Take 2 mg by mouth 2 (two) times daily.  04/24/17   [provider]  hydrALAZINE (APRESOLINE) 50 MG tablet Take 50 mg by mouth 3 (three) times daily.    [provider]  HYDROcodone-acetaminophen (NORCO) 5-325 MG tablet Take 1 tablet by mouth every 6 (six) hours as needed for moderate pain or severe pain. Patient not taking: Reported on 11/25/2017 08/06/17   Fritzi Mandes, MD  lidocaine-prilocaine (EMLA) cream Apply 1 application topically daily as needed. 10/16/17   [provider]  Melatonin 5 MG TABS Take 1 tablet (5 mg total) by mouth at bedtime. Patient not taking: Reported on 11/25/2017 04/04/17   Gladstone Lighter, MD  metoprolol succinate (TOPROL-XL) 25 MG 24 hr tablet Take 25 mg by mouth daily.    [provider]  ondansetron (ZOFRAN) 4 MG tablet Take 1 tablet (4 mg total) by mouth every 6 (six) hours as needed for nausea. Patient not taking: Reported on 11/25/2017 02/02/17   Theodoro Grist, MD  pantoprazole (PROTONIX) 40 MG tablet Take 1 tablet (40 mg total) by mouth daily. 02/03/17   Theodoro Grist, MD  Polyvinyl Alcohol-Povidone (REFRESH OP) Place 1 drop into both eyes 2 (two) times daily.    [provider]   sodium bicarbonate 650 MG tablet Take 650 mg by mouth 2 (two) times daily.    [provider]  tamsulosin (FLOMAX) 0.4 MG CAPS capsule Take 1 capsule (0.4 mg total) by mouth daily after supper. 02/19/17   Epifanio Lesches, MD  Vancomycin (VANCOCIN) 750-5 MG/150ML-% SOLN Inject 150 mLs (750 mg total) into the vein Every Tuesday,Thursday,and Saturday with dialysis. 08/07/17   Fritzi Mandes, MD  Vitamin D, Cholecalciferol, 1000 UNITS TABS Take 1 tablet by mouth daily.     [provider]      PHYSICAL EXAMINATION:   VITAL SIGNS: Blood pressure (!) 99/52, pulse 99, temperature (!) 103.1 F (39.5 C), temperature source Oral, resp. rate (!) 25, height 5\' 8"  (1.727 m), weight 169 lb (76.7 kg), SpO2 93 %.  GENERAL:  76 y.o.-year-old patient lying in the bed with no acute distress.  EYES: Pupils equal, round, reactive to light and accommodation. No scleral icterus. Extraocular muscles intact.  HEENT: Head atraumatic, normocephalic. Oropharynx and nasopharynx clear.  NECK:  Supple, no jugular  venous distention. No thyroid enlargement, no tenderness.  LUNGS: Normal breath sounds bilaterally, no wheezing, rales,rhonchi or crepitation. No use of accessory muscles of respiration.  CARDIOVASCULAR: S1, S2 normal. No murmurs, rubs, or gallops.  ABDOMEN: Soft, nontender, nondistended. Bowel sounds present. No organomegaly or mass.  EXTREMITIES: No pedal edema, cyanosis, or clubbing.  NEUROLOGIC: Cranial nerves II through XII are intact. Muscle strength 5/5 in all extremities. Sensation intact. Gait not checked.  PSYCHIATRIC: The patient is alert and oriented x 3.  SKIN: No obvious rash, lesion, or ulcer.   LABORATORY PANEL:   CBC Recent Labs  Lab 11/25/17 0715 11/25/17 1716  WBC 7.9 7.5  HGB 11.0* 12.9*  HCT 32.8* 38.1*  PLT 158 133*  MCV 93.9 93.7  MCH 31.5 31.7  MCHC 33.5 33.9  RDW 14.0 14.1  LYMPHSABS 1.4 0.6*  MONOABS 0.7 0.0*  EOSABS 0.0 0.0  BASOSABS 0.0 0.0    ------------------------------------------------------------------------------------------------------------------  Chemistries  Recent Labs  Lab 11/25/17 0715 11/25/17 1716  NA 134* 136  K 4.3 3.5  CL 98* 95*  CO2 20* 24  GLUCOSE 145* 143*  BUN 95* 62*  CREATININE 11.02* 8.10*  CALCIUM 8.9 9.0  AST  --  88*  ALT  --  51  ALKPHOS  --  487*  BILITOT  --  2.0*   ------------------------------------------------------------------------------------------------------------------ estimated creatinine clearance is 7.4 mL/min (A) (by C-G formula based on SCr of 8.1 mg/dL (H)). ------------------------------------------------------------------------------------------------------------------ No results for input(s): TSH, T4TOTAL, T3FREE, THYROIDAB in the last 72 hours.  Invalid input(s): FREET3   Coagulation profile Recent Labs  Lab 11/25/17 1716  INR 1.05   ------------------------------------------------------------------------------------------------------------------- No results for input(s): DDIMER in the last 72 hours. -------------------------------------------------------------------------------------------------------------------  Cardiac Enzymes Recent Labs  Lab 11/25/17 1716  TROPONINI 0.13*   ------------------------------------------------------------------------------------------------------------------ Invalid input(s): POCBNP  ---------------------------------------------------------------------------------------------------------------  Urinalysis    Component Value Date/Time   COLORURINE YELLOW (A) 11/25/2017 0715   APPEARANCEUR HAZY (A) 11/25/2017 0715   APPEARANCEUR Turbid 08/29/2013 2049   LABSPEC 1.014 11/25/2017 0715   LABSPEC 1.015 08/29/2013 2049   PHURINE 6.0 11/25/2017 0715   GLUCOSEU NEGATIVE 11/25/2017 0715   GLUCOSEU 50 mg/dL 08/29/2013 2049   HGBUR NEGATIVE 11/25/2017 0715   BILIRUBINUR NEGATIVE 11/25/2017 0715   BILIRUBINUR  Negative 08/29/2013 2049   KETONESUR NEGATIVE 11/25/2017 0715   PROTEINUR 100 (A) 11/25/2017 0715   NITRITE NEGATIVE 11/25/2017 0715   LEUKOCYTESUR MODERATE (A) 11/25/2017 0715   LEUKOCYTESUR 3+ 08/29/2013 2049     RADIOLOGY: Dg Chest 2 View  Result Date: 11/25/2017 CLINICAL DATA:  Fever and generalized weakness.  Fall this morning. EXAM: CHEST  2 VIEW COMPARISON:  08/07/2017 FINDINGS: A left-sided dialysis catheter remains in place with tip near the cavoatrial junction/high right atrium. The cardiac silhouette is upper limits of normal in size. Lung volumes remain diminished with mild linear opacity in the left lung base, less than on the prior study and most suggestive of atelectasis. No overt edema, pleural effusion, or pneumothorax is identified. Thoracic spondylosis is noted. IMPRESSION: Low lung volumes with mild left basilar atelectasis. Electronically Signed   By: Logan Bores M.D.   On: 11/25/2017 08:08   Dg Chest Port 1 View  Result Date: 11/25/2017 CLINICAL DATA:  76 year old male on in stage dialysis. Fever. Subsequent encounter. EXAM: PORTABLE CHEST 1 VIEW COMPARISON:  11/25/2017. FINDINGS: Left central line tip right atrium level.  Cardiomegaly. Left base subsegmental atelectasis. No infiltrate, congestive heart failure or pneumothorax. No acute osseous abnormality. IMPRESSION: Left base subsegmental  atelectasis. Cardiomegaly. Electronically Signed   By: Genia Del M.D.   On: 11/25/2017 17:34    EKG: Orders placed or performed during the hospital encounter of 11/25/17  . EKG 12-Lead  . EKG 12-Lead  . EKG 12-Lead  . EKG 12-Lead    IMPRESSION AND PLAN: Patient is a 76 year old Hispanic male with end-stage renal disease on hemodialysis with sepsis  1. sepsis likely due to UTI, however this patient also has a vascular access and has a history of PermCath infection I will cover him with broad-spectrum antibiotics with vancomycin and Zosyn Follow blood cultures Check the  flu test  2.  End-stage renal disease I will ask nephrology to see him for dialysis  3.  History of hypertension currently hypotensive hold antihypertensive  4.  Diabetes type 2 hold Amaryl will place him on sliding scale insulin  5.  BPH continue Flomax  6.  Miscellaneous heparin for DVT prophylaxis  All the records are reviewed and case discussed with ED provider. Management plans discussed with the patient, family and they are in agreement.  CODE STATUS:    Code Status Orders  (From admission, onward)        Start     Ordered   11/25/17 1938  Full code  Continuous     11/25/17 1938    Code Status History    Date Active Date Inactive Code Status Order ID Comments User Context   09/05/2017 15:08 09/05/2017 19:55 Full Code [id number]  Katha Cabal, MD Inpatient   08/02/2017 18:34 08/06/2017 19:59 Full Code [id number]  Loletha Grayer, MD ED   03/27/2017 05:42 04/04/2017 21:41 Full Code [id number]  Saundra Shelling, MD Inpatient   02/15/2017 09:11 02/19/2017 18:04 Full Code [id number]  Saundra Shelling, MD Inpatient   01/31/2017 02:36 02/02/2017 20:22 Full Code [id number]  Saundra Shelling, MD ED       TOTAL TIME TAKING CARE OF THIS PATIENT: 53minutes.    Dustin Flock M.D on 11/25/2017 at 7:39 PM  Between 7am to 6pm - Pager - [phone]  After 6pm go to [email or web address] - password [email or web address] [facility]  Office  [phone]  CC: Primary care physician; [name], MD

## 2017-11-25 NOTE — ED Notes (Signed)
Code sepsis called to [facility] 

## 2017-11-25 NOTE — ED Notes (Signed)
Patient transported to X-ray 

## 2017-11-25 NOTE — ED Provider Notes (Signed)
[facility] Emergency Department Provider Note  ____________________________________________  Time seen: Approximately 7:26 PM  I have reviewed the triage vital signs and the nursing notes.   HISTORY  Chief Complaint Emesis Level 5 Caveat: Portions of the History and Physical are unable to be obtained due to patient being a poor historian  Encounter completed with Spanish interpreter at bedside. HPI Martin Gilbert is a 76 y.o. male who comes to the ED by EMS due to fever and vomiting. He was evaluated this morning in the emergency department, found to have a urinary tract infection. He was started on antibiotics and discharged at the time as his labs and vitals are reassuring. However, he went to dialysis and had his usual session today, but has continued to feel worse. Then developed a high fever and tachycardia and vomiting. Patient complains of feeling generalized weakness as well as lower abdominal pain.     Past Medical History:  Diagnosis Date  . Anemia   . Back pain   . BPH (benign prostatic hyperplasia)   . CKD (chronic kidney disease), stage IV ([place])   . Clostridium difficile colitis   . Diabetes mellitus without complication ([place])   . GERD (gastroesophageal reflux disease)   . HTN (hypertension)   . Hyperlipidemia      Patient Active Problem List   Diagnosis Date Noted  . Complication of arteriovenous dialysis fistula 09/01/2017  . 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 Colitis 03/27/2017  . Demand ischemia ([place]) 03/27/2017  . ARF (acute renal failure) ([place]) 03/27/2017  . Enteritis due to Clostridium difficile   . Protein-calorie malnutrition, severe 02/18/2017  . C. difficile colitis 02/15/2017  . UTI (urinary tract infection) 02/02/2017  . E coli infection 02/02/2017  . Lactic acidosis 02/02/2017  . Diabetic neuropathy ([place]) 02/02/2017  . Left-sided chest wall pain 02/02/2017  . Fall 02/02/2017  . Generalized weakness  02/02/2017  . Pink eye, left 02/02/2017  . Leukocytosis 02/02/2017  . Anemia of chronic disease 02/02/2017  . Nausea 02/02/2017  . Sepsis ([place]) 01/31/2017  . Essential hypertension 11/28/2015  . Pain in the chest 02/15/2015  . Neck pain 02/15/2015  . Diabetes type 2, uncontrolled ([place]) 02/15/2015  . Chronic kidney disease, stage IV (severe) ([place]) 02/15/2015  . Frequent headaches 02/15/2015     Past Surgical History:  Procedure Laterality Date  . AV FISTULA PLACEMENT Left 06/13/2017   Procedure: ARTERIOVENOUS (AV) FISTULA CREATION ( RADIAL CEPHALIC );  Surgeon: Katha Cabal, MD;  Location: [facility] ORS;  Service: Vascular;  Laterality: Left;  . CATARACT EXTRACTION     one eye not sure which  . DIALYSIS/PERMA CATHETER INSERTION N/A 04/03/2017   Procedure: Dialysis/Perma Catheter Insertion;  Surgeon: Algernon Huxley, MD;  Location: [place] [facility];  Service: Cardiovascular;  Laterality: N/A;  . DIALYSIS/PERMA CATHETER INSERTION N/A 08/06/2017   Procedure: DIALYSIS/PERMA CATHETER INSERTION;  Surgeon: Algernon Huxley, MD;  Location: [place] [facility];  Service: Cardiovascular;  Laterality: N/A;  . DIALYSIS/PERMA CATHETER REMOVAL N/A 08/04/2017   Procedure: DIALYSIS/PERMA CATHETER REMOVAL;  Surgeon: Algernon Huxley, MD;  Location: [place] [facility];  Service: Cardiovascular;  Laterality: N/A;  . hernia repair    . UPPER EXTREMITY ANGIOGRAPHY Left 09/05/2017   Procedure: Upper Extremity Angiography;  Surgeon: Katha Cabal, MD;  Location: [place] [facility];  Service: Cardiovascular;  Laterality: Left;     Prior to Admission medications   Medication Sig Start Date End Date Taking? Authorizing  Provider  allopurinol (ZYLOPRIM) 100 MG tablet Take 100 mg by mouth daily. 10/17/15   [provider]  aspirin 81 MG tablet Take 81 mg by mouth daily.    [provider]  atorvastatin (LIPITOR) 20 MG tablet Take 20 mg by mouth daily.    [provider]  cephALEXin  (KEFLEX) 500 MG capsule Take 1 capsule (500 mg total) by mouth 3 (three) times daily for 7 days. 11/25/17 12/02/17  Rudene Re, MD  feeding supplement, GLUCERNA SHAKE, (GLUCERNA SHAKE) LIQD Take 237 mLs by mouth 3 (three) times daily between meals. Patient not taking: Reported on 11/25/2017 02/02/17   Theodoro Grist, MD  ferrous sulfate 325 (65 FE) MG tablet Take 325 mg by mouth 2 (two) times daily with a meal.  08/30/15   [provider]  glimepiride (AMARYL) 2 MG tablet Take 2 mg by mouth 2 (two) times daily.  04/24/17   [provider]  hydrALAZINE (APRESOLINE) 50 MG tablet Take 50 mg by mouth 3 (three) times daily.    [provider]  HYDROcodone-acetaminophen (NORCO) 5-325 MG tablet Take 1 tablet by mouth every 6 (six) hours as needed for moderate pain or severe pain. Patient not taking: Reported on 11/25/2017 08/06/17   Fritzi Mandes, MD  lidocaine-prilocaine (EMLA) cream Apply 1 application topically daily as needed. 10/16/17   [provider]  Melatonin 5 MG TABS Take 1 tablet (5 mg total) by mouth at bedtime. Patient not taking: Reported on 11/25/2017 04/04/17   Gladstone Lighter, MD  metoprolol succinate (TOPROL-XL) 25 MG 24 hr tablet Take 25 mg by mouth daily.    [provider]  ondansetron (ZOFRAN) 4 MG tablet Take 1 tablet (4 mg total) by mouth every 6 (six) hours as needed for nausea. Patient not taking: Reported on 11/25/2017 02/02/17   Theodoro Grist, MD  pantoprazole (PROTONIX) 40 MG tablet Take 1 tablet (40 mg total) by mouth daily. 02/03/17   Theodoro Grist, MD  Polyvinyl Alcohol-Povidone (REFRESH OP) Place 1 drop into both eyes 2 (two) times daily.    [provider]  sodium bicarbonate 650 MG tablet Take 650 mg by mouth 2 (two) times daily.    [provider]  tamsulosin (FLOMAX) 0.4 MG CAPS capsule Take 1 capsule (0.4 mg total) by mouth daily after supper. 02/19/17   Epifanio Lesches, MD  Vancomycin (VANCOCIN)  750-5 MG/150ML-% SOLN Inject 150 mLs (750 mg total) into the vein Every Tuesday,Thursday,and Saturday with dialysis. 08/07/17   Fritzi Mandes, MD  Vitamin D, Cholecalciferol, 1000 UNITS TABS Take 1 tablet by mouth daily.     [provider]     Allergies Patient has no known allergies.   Family History  Problem Relation Age of Onset  . Hypertension Mother   . Diabetes Neg Hx     Social History Social History   Tobacco Use  . Smoking status: Never Smoker  . Smokeless tobacco: Never Used  Substance Use Topics  . Alcohol use: No    Comment: Drank in the past, but has stopped for several years.   . Drug use: No    Review of Systems  Constitutional:   Positive fever   Cardiovascular:   No chest pain or syncope. Respiratory:   No dyspnea or cough. Gastrointestinal:  Positive abdominal pain and vomiting  Musculoskeletal:   Negative for focal pain or swelling All other systems reviewed and are negative except as documented above in ROS and HPI.  ____________________________________________   PHYSICAL  EXAM:  VITAL SIGNS: ED Triage Vitals  Enc Vitals Group     BP 11/25/17 1718 (!) 118/59     Pulse Rate 11/25/17 1718 (!) 108     Resp 11/25/17 1718 (!) 25     Temp 11/25/17 1715 (!) 103.1 F (39.5 C)     Temp Source 11/25/17 1715 Oral     SpO2 11/25/17 1718 91 %     Weight 11/25/17 1715 169 lb (76.7 kg)     Height 11/25/17 1715 _0  (1.727 m)     Head Circumference --      Peak Flow --      Pain Score --      Pain Loc --      Pain Edu? --      Excl. in [place]? --     Vital signs reviewed, nursing assessments reviewed.   Constitutional:   Alert and oriented. Ill-appearing. Eyes:   No scleral icterus.  EOMI. No nystagmus. No conjunctival pallor. PERRL. ENT   Head:   Normocephalic and atraumatic.   Nose:   No congestion/rhinnorhea.    Mouth/Throat:   Dry mucous membranes, no pharyngeal erythema. No peritonsillar mass.    Neck:   No meningismus.  Full ROM. Hematological/Lymphatic/Immunilogical:   No cervical lymphadenopathy. Cardiovascular:   Tachycardia heart rate 110. Symmetric bilateral radial and DP pulses.  No murmurs.  Respiratory:   Normal respiratory effort without tachypnea/retractions. Breath sounds are clear and equal bilaterally. No wheezes/rales/rhonchi. Gastrointestinal:   Soft with suprapubic tenderness. Non distended.   No rebound, rigidity, or guarding. Genitourinary:   deferred Musculoskeletal:   Normal range of motion in all extremities. No joint effusions.  No lower extremity tenderness.  No edema. Neurologic:   Normal speech, limited language range.  Motor grossly intact. No gross focal neurologic deficits are appreciated.  Skin:    Skin is warm, dry and intact. No rash noted.  No petechiae, purpura, or bullae.  ____________________________________________    LABS (pertinent positives/negatives) (all labs ordered are listed, but only abnormal results are displayed) Labs Reviewed  LACTIC ACID, PLASMA - Abnormal; Notable for the following components:      Result Value   Lactic Acid, Venous 3.1 (*)    All other components within normal limits  COMPREHENSIVE METABOLIC PANEL - Abnormal; Notable for the following components:   Chloride 95 (*)    Glucose, Bld 143 (*)    BUN 62 (*)    Creatinine, Ser 8.10 (*)    Total Protein 8.4 (*)    AST 88 (*)    Alkaline Phosphatase 487 (*)    Total Bilirubin 2.0 (*)    GFR calc non Af Amer 6 (*)    GFR calc Af Amer 7 (*)    Anion gap 17 (*)    All other components within normal limits  TROPONIN I - Abnormal; Notable for the following components:   Troponin I 0.13 (*)    All other components within normal limits  CBC WITH DIFFERENTIAL/PLATELET - Abnormal; Notable for the following components:   RBC 4.07 (*)    Hemoglobin 12.9 (*)    HCT 38.1 (*)    Platelets 133 (*)    Neutro Abs 6.9 (*)    Lymphs Abs 0.6 (*)    Monocytes Absolute 0.0 (*)    All other  components within normal limits  APTT - Abnormal; Notable for the following components:   aPTT 42 (*)    All other components within normal limits  CULTURE, BLOOD (ROUTINE X 2)  CULTURE, BLOOD (ROUTINE X 2)  LIPASE, BLOOD  PROCALCITONIN  PROTIME-INR  LACTIC ACID, PLASMA   ____________________________________________   EKG  Interpreted by me Sinus tachycardia rate 107, left axis, normal intervals, right bundle branch block. No acute ischemic changes.  ____________________________________________    RADIOLOGY  Dg Chest 2 View  Result Date: 11/25/2017 CLINICAL DATA:  Fever and generalized weakness.  Fall this morning. EXAM: CHEST  2 VIEW COMPARISON:  08/07/2017 FINDINGS: A left-sided dialysis catheter remains in place with tip near the cavoatrial junction/high right atrium. The cardiac silhouette is upper limits of normal in size. Lung volumes remain diminished with mild linear opacity in the left lung base, less than on the prior study and most suggestive of atelectasis. No overt edema, pleural effusion, or pneumothorax is identified. Thoracic spondylosis is noted. IMPRESSION: Low lung volumes with mild left basilar atelectasis. Electronically Signed   By: Logan Bores M.D.   On: 11/25/2017 08:08   Dg Chest Port 1 View  Result Date: 11/25/2017 CLINICAL DATA:  76 year old male on in stage dialysis. Fever. Subsequent encounter. EXAM: PORTABLE CHEST 1 VIEW COMPARISON:  11/25/2017. FINDINGS: Left central line tip right atrium level.  Cardiomegaly. Left base subsegmental atelectasis. No infiltrate, congestive heart failure or pneumothorax. No acute osseous abnormality. IMPRESSION: Left base subsegmental atelectasis. Cardiomegaly. Electronically Signed   By: Genia Del M.D.   On: 11/25/2017 17:34    ____________________________________________   PROCEDURES Procedures CRITICAL CARE Performed by: Joni Fears, Sruthi Maurer   Total critical care time: 35 minutes  Critical care time was  exclusive of separately billable procedures and treating other patients.  Critical care was necessary to treat or prevent imminent or life-threatening deterioration.  Critical care was time spent personally by me on the following activities: development of treatment plan with patient and/or surrogate as well as nursing, discussions with consultants, evaluation of patient's response to treatment, examination of patient, obtaining history from patient or surrogate, ordering and performing treatments and interventions, ordering and review of laboratory studies, ordering and review of radiographic studies, pulse oximetry and re-evaluation of patient's condition.  ____________________________________________     CLINICAL IMPRESSION / ASSESSMENT AND PLAN / ED COURSE  Pertinent labs & imaging results that were available during my care of the patient were reviewed by me and considered in my medical decision making (see chart for details).     Clinical Course as of Nov 25 1925  Tue Nov 25, 2017  1718 Code sepsis. Temp 103 with mouth open. Ill appearing, will need hospitalization. Cefepime, ivf boluses, labs.  [PS]  9678 Cxr interpreted by me, clear lungs. I don't see any clear pna. No ptx. Nl mediastinum. Dialysis catheter tip appears to be at RA.   [PS]  1923 High t. Bili and alk phos seen previously, non acute.   [PS]    Clinical Course User Index [PS] Carrie Mew, MD    ----------------------------------------- 7:30 PM on 11/25/2017 -----------------------------------------  Of a lactate of 3.1. Troponin 0.13, likely demand due to his tachycardia and severe systemic illness. Low suspicion of ACS, would not heparinized at this time. Case discussed with hospitalist for further management.   ____________________________________________   FINAL CLINICAL IMPRESSION(S) / ED DIAGNOSES    Final diagnoses:  Cystitis  Sepsis, due to unspecified organism [facility])  End-stage renal  disease on hemodialysis ([place])      This SmartLink is deprecated. Use AVSMEDLIST instead to display the medication list for a patient.   Portions of this  note were generated with dragon dictation software. Dictation errors may occur despite best attempts at proofreading.    Carrie Mew, MD 11/25/17 [phone]

## 2017-11-25 NOTE — ED Notes (Signed)
Date and time results received: 11/25/17 1801 (use smartphrase ".now" to insert current time)  Test: lactic acid and troponin Critical Value: lactic 3.1 troponin 0.13  Name of Provider Notified: [place]

## 2017-11-25 NOTE — ED Provider Notes (Signed)
[facility] Emergency Department Provider Note  ____________________________________________  Time seen: Approximately 7:34 AM  I have reviewed the triage vital signs and the nursing notes.   HISTORY  Chief Complaint Weakness and Fall   HPI Martin Gilbert is a 76 y.o. 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 Per patient and his daughter who is at the bedside patient returned home from dialysis 3 days ago with a fever. Has had daily fevers since then. They have not measured the temperature. Last Tylenol was yesterday evening. Patient reports generalized weakness for the last 2 days and this morning had a fall. He reports that he braced himself and helped himself to the ground during the fall, not sustaining any injury. No head trauma or LOC. He reports that he was unable to get up because he felt very weak and needed family assistance. Patient denies headache, neck stiffness, rash, sore throat, congestion, cough, shortness of breath or chest pain, abdominal pain, nausea, vomiting, diarrhea, dysuria.  Past Medical History:  Diagnosis Date  . Anemia   . Back pain   . BPH (benign prostatic hyperplasia)   . CKD (chronic kidney disease), stage IV ([place])   . Clostridium difficile colitis   . Diabetes mellitus without complication ([place])   . GERD (gastroesophageal reflux disease)   . HTN (hypertension)   . Hyperlipidemia     Patient Active Problem List   Diagnosis Date Noted  . Complication of arteriovenous dialysis fistula 09/01/2017  . ESRD on dialysis ([place]) 06/03/2017  . Colitis 03/27/2017  . Demand ischemia ([place]) 03/27/2017  . ARF (acute renal failure) ([place]) 03/27/2017  . Enteritis due to Clostridium difficile   . Protein-calorie malnutrition, severe 02/18/2017  . C. difficile colitis 02/15/2017  . UTI (urinary tract infection) 02/02/2017  . E coli infection 02/02/2017  . Lactic acidosis 02/02/2017  .  Diabetic neuropathy ([place]) 02/02/2017  . Left-sided chest wall pain 02/02/2017  . Fall 02/02/2017  . Generalized weakness 02/02/2017  . Pink eye, left 02/02/2017  . Leukocytosis 02/02/2017  . Anemia of chronic disease 02/02/2017  . Nausea 02/02/2017  . Sepsis ([place]) 01/31/2017  . Essential hypertension 11/28/2015  . Pain in the chest 02/15/2015  . Neck pain 02/15/2015  . Diabetes type 2, uncontrolled ([place]) 02/15/2015  . Chronic kidney disease, stage IV (severe) ([place]) 02/15/2015  . Frequent headaches 02/15/2015    Past Surgical History:  Procedure Laterality Date  . AV FISTULA PLACEMENT Left 06/13/2017   Procedure: ARTERIOVENOUS (AV) FISTULA CREATION ( RADIAL CEPHALIC );  Surgeon: Katha Cabal, MD;  Location: [facility] ORS;  Service: Vascular;  Laterality: Left;  . CATARACT EXTRACTION     one eye not sure which  . DIALYSIS/PERMA CATHETER INSERTION N/A 04/03/2017   Procedure: Dialysis/Perma Catheter Insertion;  Surgeon: Algernon Huxley, MD;  Location: [place] [facility];  Service: Cardiovascular;  Laterality: N/A;  . DIALYSIS/PERMA CATHETER INSERTION N/A 08/06/2017   Procedure: DIALYSIS/PERMA CATHETER INSERTION;  Surgeon: Algernon Huxley, MD;  Location: [place] [facility];  Service: Cardiovascular;  Laterality: N/A;  . DIALYSIS/PERMA CATHETER REMOVAL N/A 08/04/2017   Procedure: DIALYSIS/PERMA CATHETER REMOVAL;  Surgeon: Algernon Huxley, MD;  Location: [place] [facility];  Service: Cardiovascular;  Laterality: N/A;  . hernia repair    . UPPER EXTREMITY ANGIOGRAPHY Left 09/05/2017   Procedure: Upper Extremity Angiography;  Surgeon: Katha Cabal, MD;  Location: [place] [facility];  Service: Cardiovascular;  Laterality: Left;  Prior to Admission medications   Medication Sig Start Date End Date Taking? Authorizing Provider  allopurinol (ZYLOPRIM) 100 MG tablet Take 100 mg by mouth daily. 10/17/15  Yes [provider]  aspirin 81 MG tablet Take 81 mg by mouth daily.   Yes  [provider]  atorvastatin (LIPITOR) 20 MG tablet Take 20 mg by mouth daily.   Yes [provider]  ferrous sulfate 325 (65 FE) MG tablet Take 325 mg by mouth 2 (two) times daily with a meal.  08/30/15  Yes [provider]  glimepiride (AMARYL) 2 MG tablet Take 2 mg by mouth 2 (two) times daily.  04/24/17  Yes [provider]  hydrALAZINE (APRESOLINE) 50 MG tablet Take 50 mg by mouth 3 (three) times daily.   Yes [provider]  metoprolol succinate (TOPROL-XL) 25 MG 24 hr tablet Take 25 mg by mouth daily.   Yes [provider]  pantoprazole (PROTONIX) 40 MG tablet Take 1 tablet (40 mg total) by mouth daily. 02/03/17  Yes Theodoro Grist, MD  sodium bicarbonate 650 MG tablet Take 650 mg by mouth 2 (two) times daily.   Yes [provider]  tamsulosin (FLOMAX) 0.4 MG CAPS capsule Take 1 capsule (0.4 mg total) by mouth daily after supper. 02/19/17  Yes Epifanio Lesches, MD  Vancomycin (VANCOCIN) 750-5 MG/150ML-% SOLN Inject 150 mLs (750 mg total) into the vein Every Tuesday,Thursday,and Saturday with dialysis. 08/07/17  Yes Fritzi Mandes, MD  Vitamin D, Cholecalciferol, 1000 UNITS TABS Take 1 tablet by mouth daily.    Yes [provider]  cephALEXin (KEFLEX) 500 MG capsule Take 1 capsule (500 mg total) by mouth 3 (three) times daily for 7 days. 11/25/17 12/02/17  Rudene Re, MD  feeding supplement, GLUCERNA SHAKE, (GLUCERNA SHAKE) LIQD Take 237 mLs by mouth 3 (three) times daily between meals. Patient not taking: Reported on 11/25/2017 02/02/17   Theodoro Grist, MD  HYDROcodone-acetaminophen (NORCO) 5-325 MG tablet Take 1 tablet by mouth every 6 (six) hours as needed for moderate pain or severe pain. Patient not taking: Reported on 11/25/2017 08/06/17   Fritzi Mandes, MD  lidocaine-prilocaine (EMLA) cream Apply 1 application topically daily as needed. 10/16/17   [provider]  Melatonin 5 MG TABS Take 1 tablet (5 mg  total) by mouth at bedtime. Patient not taking: Reported on 11/25/2017 04/04/17   Gladstone Lighter, MD  ondansetron (ZOFRAN) 4 MG tablet Take 1 tablet (4 mg total) by mouth every 6 (six) hours as needed for nausea. Patient not taking: Reported on 11/25/2017 02/02/17   Theodoro Grist, MD  Polyvinyl Alcohol-Povidone (REFRESH OP) Place 1 drop into both eyes 2 (two) times daily.    [provider]    Allergies Patient has no known allergies.  Family History  Problem Relation Age of Onset  . Hypertension Mother   . Diabetes Neg Hx     Social History Social History   Tobacco Use  . Smoking status: Never Smoker  . Smokeless tobacco: Never Used  Substance Use Topics  . Alcohol use: No    Comment: Drank in the past, but has stopped for several years.   . Drug use: No    Review of Systems  Constitutional: + fever and generalized weakness Eyes: Negative for visual changes. ENT: Negative for sore throat. Neck: No neck pain  Cardiovascular: Negative for chest pain. Respiratory: Negative for shortness of breath. Gastrointestinal: Negative for abdominal pain, vomiting or diarrhea. Genitourinary: Negative for dysuria. Musculoskeletal: Negative for  back pain. Skin: Negative for rash. Neurological: Negative for headaches, weakness or numbness. Psych: No SI or HI  ____________________________________________   PHYSICAL EXAM:  VITAL SIGNS: ED Triage Vitals  Enc Vitals Group     BP 11/25/17 0721 (!) 123/58     Pulse Rate 11/25/17 0721 73     Resp 11/25/17 0721 12     Temp 11/25/17 0721 99.1 F (37.3 C)     Temp Source 11/25/17 0721 Oral     SpO2 11/25/17 0721 97 %     Weight 11/25/17 0715 169 lb 12.1 oz (77 kg)     Height 11/25/17 0715 5\' 8"  (1.727 m)     Head Circumference --      Peak Flow --      Pain Score --      Pain Loc --      Pain Edu? --      Excl. in [place]? --     Constitutional: Alert and oriented. Well appearing and in no apparent distress. HEENT:       Head: Normocephalic and atraumatic.         Eyes: Conjunctivae are normal. Sclera is non-icteric.       Mouth/Throat: Mucous membranes are moist.       Neck: Supple with no signs of meningismus. Cardiovascular: Regular rate and rhythm. No murmurs, gallops, or rubs. 2+ symmetrical distal pulses are present in all extremities. No JVD. Dialysis catheter in the L chest wall with no evidence of infection Respiratory: Normal respiratory effort. Lungs are clear to auscultation bilaterally. No wheezes, crackles, or rhonchi.  Gastrointestinal: Soft, non tender, and non distended with positive bowel sounds. No rebound or guarding. Musculoskeletal: Nontender with normal range of motion in all extremities. No edema, cyanosis, or erythema of extremities. Neurologic: Normal speech and language. Face is symmetric. Moving all extremities. No gross focal neurologic deficits are appreciated. Skin: Skin is warm, dry and intact. No rash noted. Psychiatric: Mood and affect are normal. Speech and behavior are normal.  ____________________________________________   LABS (all labs ordered are listed, but only abnormal results are displayed)  Labs Reviewed  BASIC METABOLIC PANEL - Abnormal; Notable for the following components:      Result Value   Sodium 134 (*)    Chloride 98 (*)    CO2 20 (*)    Glucose, Bld 145 (*)    BUN 95 (*)    Creatinine, Ser 11.02 (*)    GFR calc non Af Amer 4 (*)    GFR calc Af Amer 4 (*)    Anion gap 16 (*)    All other components within normal limits  CBC - Abnormal; Notable for the following components:   RBC 3.49 (*)    Hemoglobin 11.0 (*)    HCT 32.8 (*)    All other components within normal limits  URINALYSIS, COMPLETE (UACMP) WITH MICROSCOPIC - Abnormal; Notable for the following components:   Color, Urine YELLOW (*)    APPearance HAZY (*)    Protein, ur 100 (*)    Leukocytes, UA MODERATE (*)    Squamous Epithelial / LPF 0-5 (*)    All other components within  normal limits  CULTURE, BLOOD (ROUTINE X 2)  CULTURE, BLOOD (ROUTINE X 2)  URINE CULTURE  DIFFERENTIAL  LACTIC ACID, PLASMA  CBG MONITORING, ED   ____________________________________________  EKG  ED ECG REPORT I, Rudene Re, the attending physician, personally viewed and interpreted this ECG.  Normal sinus rhythm, rate of  75, first-degree AV block, right bundle branch block, normal QTC, left axis deviation, no ST elevations or depressions. Unchanged from prior from August 2018 ____________________________________________  RADIOLOGY  CXR: negative  ____________________________________________   PROCEDURES  Procedure(s) performed: None Procedures Critical Care performed:  None ____________________________________________   INITIAL IMPRESSION / ASSESSMENT AND PLAN / ED COURSE  76 y.o. 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 Patient last admitted in 07/2017 with sepsis of unknown source thought to be due to HD catheter which was replaced. Catheter site looks well appearing. Lungs CTAB, abdomen soft, no obvious infection source per history and exam. Will check labs, blood cultures, lactic, CXR and reassess. Patient afebrile in the ED. Has HD today at [place].    _________________________ 10:14 AM on 11/25/2017 -----------------------------------------  Patient remains afebrile and well-appearing in the emergency department with normal vital signs. Normal white count, normal lactic acid. UA consistent with a urinary tract infection. Patient was given a dose of Rocephin and is going to be discharged home on Keflex. I spoke with dialysis Center and they will dialyze patient at 11:00. Patient will be discharged to dialysis at this time.   As part of my medical decision making, I reviewed the following data within the [place] History obtained from family, Nursing notes reviewed and incorporated,  Labs reviewed  EKG interpreted , Old chart reviewed, Radiograph reviewed , Notes from prior ED visits and [place] Controlled Substance Database    Pertinent labs & imaging results that were available during my care of the patient were reviewed by me and considered in my medical decision making (see chart for details).    ____________________________________________   FINAL CLINICAL IMPRESSION(S) / ED DIAGNOSES  Final diagnoses:  Acute cystitis with hematuria  Fever, unspecified fever cause      NEW MEDICATIONS STARTED DURING THIS VISIT:  ED Discharge Orders        Ordered    cephALEXin (KEFLEX) 500 MG capsule  3 times daily     11/25/17 1014       Note:  This document was prepared using Dragon voice recognition software and may include unintentional dictation errors.    Rudene Re, MD 11/25/17 [phone]

## 2017-11-25 NOTE — Progress Notes (Signed)
ANTIBIOTIC CONSULT NOTE - INITIAL  Pharmacy Consult for cefepime Indication: uti  No Known Allergies  Patient Measurements: Height: 5\' 8"  (172.7 cm) Weight: 169 lb (76.7 kg) IBW/kg (Calculated) : 68.4 Adjusted Body Weight:   Vital Signs: Temp: 103.1 F (39.5 C) (12/11 1715) Temp Source: Oral (12/11 1715) BP: 96/52 (12/11 1900) Pulse Rate: 96 (12/11 1900) Intake/Output from previous day: No intake/output data recorded. Intake/Output from this shift: No intake/output data recorded.  Labs: Recent Labs    11/25/17 0715 11/25/17 1716  WBC 7.9 7.5  HGB 11.0* 12.9*  PLT 158 133*  CREATININE 11.02* 8.10*   Estimated Creatinine Clearance: 7.4 mL/min (A) (by C-G formula based on SCr of 8.1 mg/dL (H)). No results for input(s): VANCOTROUGH, VANCOPEAK, VANCORANDOM, GENTTROUGH, GENTPEAK, GENTRANDOM, TOBRATROUGH, TOBRAPEAK, TOBRARND, AMIKACINPEAK, AMIKACINTROU, AMIKACIN in the last 72 hours.   Microbiology: No results found for this or any previous visit (from the past 720 hour(s)).  Medical History: Past Medical History:  Diagnosis Date  . Anemia   . Back pain   . BPH (benign prostatic hyperplasia)   . CKD (chronic kidney disease), stage IV ([place])   . Clostridium difficile colitis   . Diabetes mellitus without complication ([place])   . GERD (gastroesophageal reflux disease)   . HTN (hypertension)   . Hyperlipidemia     Medications:   (Not in a hospital admission) Scheduled:  . [START ON 11/26/2017] ceFEPime (MAXIPIME) IV  500 mg Intravenous Q24H   Assessment: Pharmacy to dose and monitor cefepime. Patient received cefepime 1 g in ED.   Goal of Therapy:    Plan:  Will start Cefepime 500 mg IV q24 hours for possible uti.   Maggie Dworkin D 11/25/2017,7:30 PM

## 2017-11-25 NOTE — ED Notes (Signed)
Patient to lobby in wheelchair with family. NAD noted at this time. Family verbalized understanding of discharge instructions and follow-up care

## 2017-11-26 ENCOUNTER — Other Ambulatory Visit: Payer: Self-pay

## 2017-11-26 DIAGNOSIS — A419 Sepsis, unspecified organism: Secondary | ICD-10-CM

## 2017-11-26 LAB — CBC
HEMATOCRIT: 30.9 % — AB (ref 40.0–52.0)
HEMOGLOBIN: 10.2 g/dL — AB (ref 13.0–18.0)
MCH: 31.4 pg (ref 26.0–34.0)
MCHC: 33.2 g/dL (ref 32.0–36.0)
MCV: 94.7 fL (ref 80.0–100.0)
Platelets: 113 10*3/uL — ABNORMAL LOW (ref 150–440)
RBC: 3.26 MIL/uL — AB (ref 4.40–5.90)
RDW: 14.2 % (ref 11.5–14.5)
WBC: 21.7 10*3/uL — AB (ref 3.8–10.6)

## 2017-11-26 LAB — BLOOD CULTURE ID PANEL (REFLEXED)
Acinetobacter baumannii: NOT DETECTED
Acinetobacter baumannii: NOT DETECTED
CANDIDA ALBICANS: NOT DETECTED
CANDIDA TROPICALIS: NOT DETECTED
CARBAPENEM RESISTANCE: NOT DETECTED
Candida albicans: NOT DETECTED
Candida glabrata: NOT DETECTED
Candida glabrata: NOT DETECTED
Candida krusei: NOT DETECTED
Candida krusei: NOT DETECTED
Candida parapsilosis: NOT DETECTED
Candida parapsilosis: NOT DETECTED
Candida tropicalis: NOT DETECTED
ENTEROBACTER CLOACAE COMPLEX: DETECTED — AB
ENTEROBACTER CLOACAE COMPLEX: NOT DETECTED
ENTEROBACTERIACEAE SPECIES: DETECTED — AB
ENTEROCOCCUS SPECIES: NOT DETECTED
ENTEROCOCCUS SPECIES: NOT DETECTED
Enterobacteriaceae species: NOT DETECTED
Escherichia coli: NOT DETECTED
Escherichia coli: NOT DETECTED
HAEMOPHILUS INFLUENZAE: NOT DETECTED
HAEMOPHILUS INFLUENZAE: NOT DETECTED
KLEBSIELLA PNEUMONIAE: NOT DETECTED
Klebsiella oxytoca: NOT DETECTED
Klebsiella oxytoca: NOT DETECTED
Klebsiella pneumoniae: NOT DETECTED
LISTERIA MONOCYTOGENES: NOT DETECTED
LISTERIA MONOCYTOGENES: NOT DETECTED
METHICILLIN RESISTANCE: NOT DETECTED
NEISSERIA MENINGITIDIS: NOT DETECTED
Neisseria meningitidis: NOT DETECTED
PROTEUS SPECIES: NOT DETECTED
PSEUDOMONAS AERUGINOSA: NOT DETECTED
Proteus species: NOT DETECTED
Pseudomonas aeruginosa: NOT DETECTED
STAPHYLOCOCCUS AUREUS BCID: NOT DETECTED
STAPHYLOCOCCUS AUREUS BCID: NOT DETECTED
STAPHYLOCOCCUS SPECIES: DETECTED — AB
STREPTOCOCCUS AGALACTIAE: NOT DETECTED
STREPTOCOCCUS AGALACTIAE: NOT DETECTED
STREPTOCOCCUS PNEUMONIAE: NOT DETECTED
STREPTOCOCCUS PYOGENES: NOT DETECTED
STREPTOCOCCUS SPECIES: NOT DETECTED
STREPTOCOCCUS SPECIES: NOT DETECTED
Serratia marcescens: NOT DETECTED
Serratia marcescens: NOT DETECTED
Staphylococcus species: NOT DETECTED
Streptococcus pneumoniae: NOT DETECTED
Streptococcus pyogenes: NOT DETECTED

## 2017-11-26 LAB — BASIC METABOLIC PANEL
ANION GAP: 12 (ref 5–15)
BUN: 69 mg/dL — AB (ref 6–20)
CHLORIDE: 104 mmol/L (ref 101–111)
CO2: 20 mmol/L — AB (ref 22–32)
Calcium: 8 mg/dL — ABNORMAL LOW (ref 8.9–10.3)
Creatinine, Ser: 8.87 mg/dL — ABNORMAL HIGH (ref 0.61–1.24)
GFR calc Af Amer: 6 mL/min — ABNORMAL LOW (ref >60)
GFR, EST NON AFRICAN AMERICAN: 5 mL/min — AB (ref >60)
GLUCOSE: 124 mg/dL — AB (ref 65–99)
POTASSIUM: 4.3 mmol/L (ref 3.5–5.1)
Sodium: 136 mmol/L (ref 135–145)

## 2017-11-26 LAB — GLUCOSE, CAPILLARY
GLUCOSE-CAPILLARY: 153 mg/dL — AB (ref 65–99)
GLUCOSE-CAPILLARY: 186 mg/dL — AB (ref 65–99)
Glucose-Capillary: 147 mg/dL — ABNORMAL HIGH (ref 65–99)
Glucose-Capillary: 103 mg/dL — ABNORMAL HIGH (ref 65–99)
Glucose-Capillary: 152 mg/dL — ABNORMAL HIGH (ref 65–99)

## 2017-11-26 LAB — INFLUENZA PANEL BY PCR (TYPE A & B)
INFLAPCR: NEGATIVE
Influenza B By PCR: NEGATIVE

## 2017-11-26 LAB — URINE CULTURE: CULTURE: NO GROWTH

## 2017-11-26 LAB — MRSA PCR SCREENING: MRSA BY PCR: NEGATIVE

## 2017-11-26 MED ORDER — SODIUM CHLORIDE 0.9 % IV BOLUS (SEPSIS)
500.0000 mL | Freq: Once | INTRAVENOUS | Status: AC
  Administered 2017-11-26: 500 mL via INTRAVENOUS

## 2017-11-26 MED ORDER — RENA-VITE PO TABS
1.0000 | ORAL_TABLET | Freq: Every day | ORAL | Status: DC
  Administered 2017-11-26 – 2017-11-27 (×2): 1 via ORAL
  Filled 2017-11-26 (×2): qty 1

## 2017-11-26 MED ORDER — HYDROCORTISONE NA SUCCINATE PF 100 MG IJ SOLR
50.0000 mg | Freq: Four times a day (QID) | INTRAMUSCULAR | Status: DC
  Administered 2017-11-26 – 2017-11-28 (×8): 50 mg via INTRAVENOUS
  Filled 2017-11-26 (×8): qty 2

## 2017-11-26 MED ORDER — ACETAMINOPHEN 325 MG PO TABS
325.0000 mg | ORAL_TABLET | Freq: Once | ORAL | Status: AC
  Administered 2017-11-26: 325 mg via ORAL
  Filled 2017-11-26: qty 1

## 2017-11-26 MED ORDER — SODIUM CHLORIDE 0.9 % IV SOLN
0.0000 ug/min | INTRAVENOUS | Status: DC
  Administered 2017-11-26: 60 ug/min via INTRAVENOUS
  Administered 2017-11-26 (×2): 20 ug/min via INTRAVENOUS
  Filled 2017-11-26 (×3): qty 1
  Filled 2017-11-26 (×3): qty 10

## 2017-11-26 MED ORDER — DEXTROSE 5 % IV SOLN
1.0000 g | INTRAVENOUS | Status: DC
  Administered 2017-11-26: 1 g via INTRAVENOUS
  Filled 2017-11-26 (×3): qty 1

## 2017-11-26 MED ORDER — SODIUM CHLORIDE 0.9 % IV BOLUS (SEPSIS)
1000.0000 mL | Freq: Once | INTRAVENOUS | Status: AC
  Administered 2017-11-26: 1000 mL via INTRAVENOUS

## 2017-11-26 MED ORDER — DOPAMINE-DEXTROSE 3.2-5 MG/ML-% IV SOLN: 0.0000 ug/kg/min | INTRAVENOUS | Status: DC

## 2017-11-26 NOTE — Progress Notes (Signed)
eLink Physician-Brief Progress Note Patient Name: Martin Gilbert DOB: 03-18-1941 MRN: [id number]   Date of Service  11/26/2017  HPI/Events of Note  76 year old male with multiple co-morbidities, admitted with sepsis secondary to UTI .  Patient transferred from the floor due to hypotension not responding to fluids requiring pressors. Phenylephrine being titrated for hemodynamic support. PCCM has seen the patient in consultation.   eICU Interventions  No new orders.      Intervention Category Evaluation Type: New Patient Evaluation  Lysle Dingwall 11/26/2017, 4:31 AM

## 2017-11-26 NOTE — Progress Notes (Signed)
Patient is hypotensive inspite of receiving close to 4 liter iv fluids. Pt is on dialysis Will transfer patient to step down unit and start on iv pressor meds.

## 2017-11-26 NOTE — Consult Note (Signed)
Name: Martin Gilbert MRN: [id number] DOB: 1940/12/26    ADMISSION DATE:  11/25/2017  CONSULTATION DATE:  11/26/17  REFERRING MD :  Dr. Estanislado Pandy  CHIEF COMPLAINT:  "Emesis"  BRIEF PATIENT DESCRIPTION: 76 year old male with multiple co-morbidities, admitted with sepsis secondary to UTI .  Patient transferred from the floor due to hypotension not responding to fluids requiring pressors.  SIGNIFICANT EVENTS  12/11 Patient admitted to [facility] with sepsis related to UTI 12/11 Patient transferred from the floor due to hypotension not responding to fluids, may require pressors  STUDIES:  03/27/17 ECHO>> The cavity size was normal. There was mild  concentric hypertrophy. Systolic function was normal. The   estimated ejection fraction was in the range of 60% to 65%   HISTORY OF PRESENT ILLNESS: Martin Gilbert is a 76 year old with known history of BPH,CKD stage IV,DM,GERD, Hypertension and Hyperlipidemia, ESRD  With Tue- Thurs- Saturday dialysis.  Patient admitted on 12/11 to [facility] due to UTI. Patient was transferred from the med-surg unit due to hypotension not responding to fluids.  He received 4liters of fluid but remains hypotensive.  Therefore patient was transferred to the SDU for closer monitoring and initiating vasopressors.  PAST MEDICAL HISTORY :   has a past medical history of Anemia, Back pain, BPH (benign prostatic hyperplasia), CKD (chronic kidney disease), stage IV ([place]), Clostridium difficile colitis, Diabetes mellitus without complication ([place]), GERD (gastroesophageal reflux disease), HTN (hypertension), and Hyperlipidemia.  has a past surgical history that includes hernia repair; DIALYSIS/PERMA CATHETER INSERTION (N/A, 04/03/2017); Cataract extraction; AV fistula placement (Left, 06/13/2017); DIALYSIS/PERMA CATHETER REMOVAL (N/A, 08/04/2017); DIALYSIS/PERMA CATHETER INSERTION (N/A, 08/06/2017); and Upper Extremity Angiography (Left, 09/05/2017). Prior to Admission  medications   Medication Sig Start Date End Date Taking? Authorizing Provider  allopurinol (ZYLOPRIM) 100 MG tablet Take 100 mg by mouth daily. 10/17/15   [provider]  aspirin 81 MG tablet Take 81 mg by mouth daily.    [provider]  atorvastatin (LIPITOR) 20 MG tablet Take 20 mg by mouth daily.    [provider]  cephALEXin (KEFLEX) 500 MG capsule Take 1 capsule (500 mg total) by mouth 3 (three) times daily for 7 days. 11/25/17 12/02/17  Rudene Re, MD  feeding supplement, GLUCERNA SHAKE, (GLUCERNA SHAKE) LIQD Take 237 mLs by mouth 3 (three) times daily between meals. Patient not taking: Reported on 11/25/2017 02/02/17   Theodoro Grist, MD  ferrous sulfate 325 (65 FE) MG tablet Take 325 mg by mouth 2 (two) times daily with a meal.  08/30/15   [provider]  glimepiride (AMARYL) 2 MG tablet Take 2 mg by mouth 2 (two) times daily.  04/24/17   [provider]  hydrALAZINE (APRESOLINE) 50 MG tablet Take 50 mg by mouth 3 (three) times daily.    [provider]  HYDROcodone-acetaminophen (NORCO) 5-325 MG tablet Take 1 tablet by mouth every 6 (six) hours as needed for moderate pain or severe pain. Patient not taking: Reported on 11/25/2017 08/06/17   Fritzi Mandes, MD  lidocaine-prilocaine (EMLA) cream Apply 1 application topically daily as needed. 10/16/17   [provider]  Melatonin 5 MG TABS Take 1 tablet (5 mg total) by mouth at bedtime. Patient not taking: Reported on 11/25/2017 04/04/17   Gladstone Lighter, MD  metoprolol succinate (TOPROL-XL) 25 MG 24 hr tablet Take 25 mg by mouth daily.    [provider]  ondansetron (ZOFRAN) 4 MG tablet Take 1 tablet (4 mg total) by mouth every  6 (six) hours as needed for nausea. Patient not taking: Reported on 11/25/2017 02/02/17   Theodoro Grist, MD  pantoprazole (PROTONIX) 40 MG tablet Take 1 tablet (40 mg total) by mouth daily. 02/03/17   Theodoro Grist, MD  Polyvinyl  Alcohol-Povidone (REFRESH OP) Place 1 drop into both eyes 2 (two) times daily.    [provider]  sodium bicarbonate 650 MG tablet Take 650 mg by mouth 2 (two) times daily.    [provider]  tamsulosin (FLOMAX) 0.4 MG CAPS capsule Take 1 capsule (0.4 mg total) by mouth daily after supper. 02/19/17   Epifanio Lesches, MD  Vancomycin (VANCOCIN) 750-5 MG/150ML-% SOLN Inject 150 mLs (750 mg total) into the vein Every Tuesday,Thursday,and Saturday with dialysis. 08/07/17   Fritzi Mandes, MD  Vitamin D, Cholecalciferol, 1000 UNITS TABS Take 1 tablet by mouth daily.     [provider]   No Known Allergies  FAMILY HISTORY:  family history includes Hypertension in his mother. SOCIAL HISTORY:  reports that  has never smoked. he has never used smokeless tobacco. He reports that he does not drink alcohol or use drugs.  SUBJECTIVE: Patient is drowsy and lethargic  VITAL SIGNS: Temp:  [98.1 F (36.7 C)-103.1 F (39.5 C)] 99.6 F (37.6 C) (12/12 0212) Pulse Rate:  [64-108] 66 (12/12 0254) Resp:  [12-30] 24 (12/12 0212) BP: (80-147)/(37-122) 83/41 (12/12 0254) SpO2:  [91 %-98 %] 98 % (12/12 0212) Weight:  [169 lb (76.7 kg)-169 lb 12.1 oz (77 kg)] 169 lb (76.7 kg) (12/11 1715)  PHYSICAL EXAMINATION: General:  76 year old male, found on bed ,not in any acute distress Neuro:  Opens eyes to voice, is drowsy and lethargic HEENT:  AT,[place],No jvd Cardiovascular: S1S2,regular, no m/r/g Lungs: diminished , no wheezes,crackles and rhonchi Abdomen:  Soft,NT,ND Musculoskeletal:  No edema,cyanosis Skin:  Warm,dry and intact  Recent Labs  Lab 11/25/17 0715 11/25/17 1716  NA 134* 136  K 4.3 3.5  CL 98* 95*  CO2 20* 24  BUN 95* 62*  CREATININE 11.02* 8.10*  GLUCOSE 145* 143*   Recent Labs  Lab 11/25/17 0715 11/25/17 1716  HGB 11.0* 12.9*  HCT 32.8* 38.1*  WBC 7.9 7.5  PLT 158 133*   Dg Chest 2 View  Result Date: 11/25/2017 CLINICAL DATA:  Fever and generalized  weakness.  Fall this morning. EXAM: CHEST  2 VIEW COMPARISON:  08/07/2017 FINDINGS: A left-sided dialysis catheter remains in place with tip near the cavoatrial junction/high right atrium. The cardiac silhouette is upper limits of normal in size. Lung volumes remain diminished with mild linear opacity in the left lung base, less than on the prior study and most suggestive of atelectasis. No overt edema, pleural effusion, or pneumothorax is identified. Thoracic spondylosis is noted. IMPRESSION: Low lung volumes with mild left basilar atelectasis. Electronically Signed   By: Logan Bores M.D.   On: 11/25/2017 08:08   Dg Chest Port 1 View  Result Date: 11/25/2017 CLINICAL DATA:  76 year old male on in stage dialysis. Fever. Subsequent encounter. EXAM: PORTABLE CHEST 1 VIEW COMPARISON:  11/25/2017. FINDINGS: Left central line tip right atrium level.  Cardiomegaly. Left base subsegmental atelectasis. No infiltrate, congestive heart failure or pneumothorax. No acute osseous abnormality. IMPRESSION: Left base subsegmental atelectasis. Cardiomegaly. Electronically Signed   By: Genia Del M.D.   On: 11/25/2017 17:34    ASSESSMENT / PLAN:  Septic shock secondary to UTI ESRD - TU -THU-SAT Hx of Hypertension/Hyperlipidemia Hx of BPH DM -type-2   Plan  Will start on Neosynephrine Keep MAP GOAL>60 Monitor fever,cbc Follow cultures Continue Cefipime Blood glucose checks with SSI coverage Continue Renal diet will hold metoprolol for Continue Atorvastatin Continue Aspirin     Ausar Georgiou,AG-ACNP Pulmonary and [place]   11/26/2017, 3:11 AM

## 2017-11-26 NOTE — Progress Notes (Signed)
MD notified of BP. Orders to transfer to [facility] unit.

## 2017-11-26 NOTE — Progress Notes (Signed)
ANTIBIOTIC CONSULT NOTE - INITIAL  Pharmacy Consult for cefepime Indication: uti  No Known Allergies  Patient Measurements: Height: 5\' 8"  (172.7 cm) Weight: 169 lb (76.7 kg) IBW/kg (Calculated) : 68.4 Adjusted Body Weight:   Vital Signs: Temp: 99.6 F (37.6 C) (12/12 0212) Temp Source: Oral (12/12 0212) BP: 83/41 (12/12 0254) Pulse Rate: 66 (12/12 0254) Intake/Output from previous day: 12/11 0701 - 12/12 0700 In: 500 [IV Piggyback:500] Out: 50 [Urine:50] Intake/Output from this shift: Total I/O In: 500 [IV Piggyback:500] Out: 50 [Urine:50]  Labs: Recent Labs    11/25/17 0715 11/25/17 1716  WBC 7.9 7.5  HGB 11.0* 12.9*  PLT 158 133*  CREATININE 11.02* 8.10*   Estimated Creatinine Clearance: 7.4 mL/min (A) (by C-G formula based on SCr of 8.1 mg/dL (H)). No results for input(s): VANCOTROUGH, VANCOPEAK, VANCORANDOM, GENTTROUGH, GENTPEAK, GENTRANDOM, TOBRATROUGH, TOBRAPEAK, TOBRARND, AMIKACINPEAK, AMIKACINTROU, AMIKACIN in the last 72 hours.   Microbiology: Recent Results (from the past 720 hour(s))  Blood culture (routine x 2)     Status: None (Preliminary result)   Collection Time: 11/25/17  8:09 AM  Result Value Ref Range Status   Specimen Description BLOOD LEFT ANTECUBITAL  Final   Special Requests   Final    BOTTLES DRAWN AEROBIC AND ANAEROBIC Blood Culture adequate volume   Culture  Setup Time   Final    Organism ID to follow GRAM NEGATIVE RODS ANAEROBIC BOTTLE ONLY CRITICAL RESULT CALLED TO, READ BACK BY AND VERIFIED WITH: MAT Staples ON 11/26/17 AT 0132 JAG    Culture GRAM NEGATIVE RODS  Final   Report Status PENDING  Incomplete  Blood Culture ID Panel (Reflexed)     Status: Abnormal   Collection Time: 11/25/17  8:09 AM  Result Value Ref Range Status   Enterococcus species NOT DETECTED NOT DETECTED Final   Listeria monocytogenes NOT DETECTED NOT DETECTED Final   Staphylococcus species NOT DETECTED NOT DETECTED Final   Staphylococcus aureus NOT DETECTED  NOT DETECTED Final   Streptococcus species NOT DETECTED NOT DETECTED Final   Streptococcus agalactiae NOT DETECTED NOT DETECTED Final   Streptococcus pneumoniae NOT DETECTED NOT DETECTED Final   Streptococcus pyogenes NOT DETECTED NOT DETECTED Final   Acinetobacter baumannii NOT DETECTED NOT DETECTED Final   Enterobacteriaceae species DETECTED (A) NOT DETECTED Final    Comment: Enterobacteriaceae represent a large family of gram-negative bacteria, not a single organism. CRITICAL RESULT CALLED TO, READ BACK BY AND VERIFIED WITH: MAT Shantinique Picazo ON 11/26/17 AT 0132 JAG    Enterobacter cloacae complex DETECTED (A) NOT DETECTED Final    Comment: CRITICAL RESULT CALLED TO, READ BACK BY AND VERIFIED WITH: MAT Starletta Houchin ON 11/26/17 AT 0132 JAG    Escherichia coli NOT DETECTED NOT DETECTED Final   Klebsiella oxytoca NOT DETECTED NOT DETECTED Final   Klebsiella pneumoniae NOT DETECTED NOT DETECTED Final   Proteus species NOT DETECTED NOT DETECTED Final   Serratia marcescens NOT DETECTED NOT DETECTED Final   Carbapenem resistance NOT DETECTED NOT DETECTED Final   Haemophilus influenzae NOT DETECTED NOT DETECTED Final   Neisseria meningitidis NOT DETECTED NOT DETECTED Final   Pseudomonas aeruginosa NOT DETECTED NOT DETECTED Final   Candida albicans NOT DETECTED NOT DETECTED Final   Candida glabrata NOT DETECTED NOT DETECTED Final   Candida krusei NOT DETECTED NOT DETECTED Final   Candida parapsilosis NOT DETECTED NOT DETECTED Final   Candida tropicalis NOT DETECTED NOT DETECTED Final  MRSA PCR Screening     Status: None   Collection Time:  11/25/17 10:41 PM  Result Value Ref Range Status   MRSA by PCR NEGATIVE NEGATIVE Final    Comment:        The GeneXpert MRSA Assay (FDA approved for NASAL specimens only), is one component of a comprehensive MRSA colonization surveillance program. It is not intended to diagnose MRSA infection nor to guide or monitor treatment for MRSA infections.      Medical History: Past Medical History:  Diagnosis Date  . Anemia   . Back pain   . BPH (benign prostatic hyperplasia)   . CKD (chronic kidney disease), stage IV ([place])   . Clostridium difficile colitis   . Diabetes mellitus without complication ([place])   . GERD (gastroesophageal reflux disease)   . HTN (hypertension)   . Hyperlipidemia     Medications:  Facility-Administered Medications Prior to Admission  Medication Dose Route Frequency Provider Last Rate Last Dose  . vancomycin (VANCOCIN) 1,000 mg in sodium chloride 0.9 % 500 mL IVPB  1,000 mg Intravenous Once Stegmayer, Kimberly A, PA-C       Medications Prior to Admission  Medication Sig Dispense Refill Last Dose  . allopurinol (ZYLOPRIM) 100 MG tablet Take 100 mg by mouth daily.   11/24/2017 at Unknown time  . aspirin 81 MG tablet Take 81 mg by mouth daily.   11/24/2017 at Unknown time  . atorvastatin (LIPITOR) 20 MG tablet Take 20 mg by mouth daily.   11/24/2017 at Unknown time  . cephALEXin (KEFLEX) 500 MG capsule Take 1 capsule (500 mg total) by mouth 3 (three) times daily for 7 days. 21 capsule 0   . feeding supplement, GLUCERNA SHAKE, (GLUCERNA SHAKE) LIQD Take 237 mLs by mouth 3 (three) times daily between meals. (Patient not taking: Reported on 11/25/2017) 90 Can 6 Not Taking at Unknown time  . ferrous sulfate 325 (65 FE) MG tablet Take 325 mg by mouth 2 (two) times daily with a meal.   11 11/24/2017 at Unknown time  . glimepiride (AMARYL) 2 MG tablet Take 2 mg by mouth 2 (two) times daily.    11/24/2017 at Unknown time  . hydrALAZINE (APRESOLINE) 50 MG tablet Take 50 mg by mouth 3 (three) times daily.   11/24/2017 at Unknown time  . HYDROcodone-acetaminophen (NORCO) 5-325 MG tablet Take 1 tablet by mouth every 6 (six) hours as needed for moderate pain or severe pain. (Patient not taking: Reported on 11/25/2017) 25 tablet 0 Not Taking at Unknown time  . lidocaine-prilocaine (EMLA) cream Apply 1 application topically daily  as needed.   prn at prn  . Melatonin 5 MG TABS Take 1 tablet (5 mg total) by mouth at bedtime. (Patient not taking: Reported on 11/25/2017) 30 tablet 0 Not Taking at Unknown time  . metoprolol succinate (TOPROL-XL) 25 MG 24 hr tablet Take 25 mg by mouth daily.   11/24/2017 at Unknown time  . ondansetron (ZOFRAN) 4 MG tablet Take 1 tablet (4 mg total) by mouth every 6 (six) hours as needed for nausea. (Patient not taking: Reported on 11/25/2017) 20 tablet 0 Not Taking at Unknown time  . pantoprazole (PROTONIX) 40 MG tablet Take 1 tablet (40 mg total) by mouth daily. 30 tablet 6 11/24/2017 at Unknown time  . Polyvinyl Alcohol-Povidone (REFRESH OP) Place 1 drop into both eyes 2 (two) times daily.   prn at prn  . sodium bicarbonate 650 MG tablet Take 650 mg by mouth 2 (two) times daily.   11/24/2017 at Unknown time  . tamsulosin (FLOMAX) 0.4 MG  CAPS capsule Take 1 capsule (0.4 mg total) by mouth daily after supper. 30 capsule 0 11/24/2017 at Unknown time  . Vancomycin (VANCOCIN) 750-5 MG/150ML-% SOLN Inject 150 mLs (750 mg total) into the vein Every Tuesday,Thursday,and Saturday with dialysis. 4000 mL 0 Past Week at Unknown time  . Vitamin D, Cholecalciferol, 1000 UNITS TABS Take 1 tablet by mouth daily.    11/24/2017 at Unknown time   Scheduled:  . allopurinol  100 mg Oral Daily  . aspirin EC  81 mg Oral Daily  . atorvastatin  20 mg Oral Daily  . cholecalciferol  1,000 Units Oral Daily  . feeding supplement (GLUCERNA SHAKE)  237 mL Oral TID BM  . ferrous sulfate  325 mg Oral BID WC  . heparin injection (subcutaneous)  5,000 Units Subcutaneous Q8H  . insulin aspart  0-9 Units Subcutaneous TID WC  . Melatonin  5 mg Oral QHS  . metoprolol succinate  25 mg Oral Daily  . pantoprazole  40 mg Oral Daily  . polyvinyl alcohol  2 drop Both Eyes BID  . sodium bicarbonate  650 mg Oral BID  . sodium chloride flush  3 mL Intravenous Q12H  . tamsulosin  0.4 mg Oral QPC supper   Assessment: Pharmacy to  dose and monitor cefepime. Patient received cefepime 1 g in ED.   Goal of Therapy:    Plan:  Will start Cefepime 500 mg IV q24 hours for possible uti.   12/12 0200 Patient now with 1/4 bottles GNR, E. cloacae. On pressor therapy with transfer to CCU. Will increase cefepime to 1 gram daily.  Martin Gilbert S 11/26/2017,3:22 AM

## 2017-11-26 NOTE — Care Management (Signed)
RNCM happened to see a note from [place] mentioning concern for patient not being able to afford medications. Patient has Medicare and Medicaid. Patient's PCP is with [facility] per [facility] [phone]. I have reached out to [place] with [facility] to see if Hemodialysis team had been notified of patient's concern- waiting for response. I have also reached out to [facility] [phone]-- per pharmacist patient has not had an issue and everything he uses is moderately priced. They have arranged price adjustments for him in the past and they can work with him again- pharmacist said there will not be a problem and that they will help patient.

## 2017-11-26 NOTE — Progress Notes (Signed)
Patient is now on phenylophrine drip at 50 mcg. Lethargic and follow commands at times. Spanish speaking only. Family has been updated on floor transfer. Vital signs stable at this time. Will continue to monitor and assess patient.

## 2017-11-26 NOTE — Progress Notes (Signed)
Pt noted to have elevated temp and low BP on admission. MD notified. Orders to continue to monitor BP and give PRN dose of tylenol. Tylenol given. BP and temp rechecked. No improvement. MD notified. Orders for extra 325mg  PO tylenol and 500cc bolus (MD aware of 2500cc IVF intake in ED and ESRD/HD status). VS rechecked. Improvement with temp. BP decreasing further. MD notified - orders to recheck BP in half an hour, if no improvement notify MD. Lungs clear, no s/s of respiratory distress. Will continue to monitor.

## 2017-11-26 NOTE — Clinical Social Work Note (Signed)
CSW consulted by nurse stating "unable to afford medications." RN CM to be notified.  Shela Leff MSW,LCSW [phone]

## 2017-11-26 NOTE — Progress Notes (Signed)
PHARMACY - PHYSICIAN COMMUNICATION CRITICAL VALUE ALERT - BLOOD CULTURE IDENTIFICATION (BCID)  Martin Gilbert is an 76 y.o. male who presented to [facility] on 11/25/2017 with a chief complaint of UTI  Assessment:   (include suspected source if known)  Name of physician (or Provider) Contacted: [place]  Current antibiotics: cefepime  Changes to prescribed antibiotics recommended:  Increased dose to 1 gram daily (ESRD.)  Results for orders placed or performed during the hospital encounter of 11/25/17  Blood Culture ID Panel (Reflexed) (Collected: 11/25/2017  8:09 AM)  Result Value Ref Range   Enterococcus species NOT DETECTED NOT DETECTED   Listeria monocytogenes NOT DETECTED NOT DETECTED   Staphylococcus species NOT DETECTED NOT DETECTED   Staphylococcus aureus NOT DETECTED NOT DETECTED   Streptococcus species NOT DETECTED NOT DETECTED   Streptococcus agalactiae NOT DETECTED NOT DETECTED   Streptococcus pneumoniae NOT DETECTED NOT DETECTED   Streptococcus pyogenes NOT DETECTED NOT DETECTED   Acinetobacter baumannii NOT DETECTED NOT DETECTED   Enterobacteriaceae species DETECTED (A) NOT DETECTED   Enterobacter cloacae complex DETECTED (A) NOT DETECTED   Escherichia coli NOT DETECTED NOT DETECTED   Klebsiella oxytoca NOT DETECTED NOT DETECTED   Klebsiella pneumoniae NOT DETECTED NOT DETECTED   Proteus species NOT DETECTED NOT DETECTED   Serratia marcescens NOT DETECTED NOT DETECTED   Carbapenem resistance NOT DETECTED NOT DETECTED   Haemophilus influenzae NOT DETECTED NOT DETECTED   Neisseria meningitidis NOT DETECTED NOT DETECTED   Pseudomonas aeruginosa NOT DETECTED NOT DETECTED   Candida albicans NOT DETECTED NOT DETECTED   Candida glabrata NOT DETECTED NOT DETECTED   Candida krusei NOT DETECTED NOT DETECTED   Candida parapsilosis NOT DETECTED NOT DETECTED   Candida tropicalis NOT DETECTED NOT DETECTED    Martin Gilbert 11/26/2017  3:32 AM

## 2017-11-26 NOTE — Progress Notes (Signed)
Initial Nutrition Assessment  DOCUMENTATION CODES:   Non-severe (moderate) malnutrition in context of chronic illness  INTERVENTION:  Continue Glucerna Shake po TID, each supplement provides 220 kcal and 10 grams of protein. Encouraged patient/family to ask their outpatient RD at HD center if he/she can provide patient with an oral nutrition supplement for home since he cannot afford Glucerna anymore.  Will add note to diet order to provided chopped meat with gravy in setting of patient's difficulty chewing.  Provide Rena-vite QHS.  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  GOAL:   Patient will meet greater than or equal to 90% of their needs  MONITOR:   PO intake, Supplement acceptance, Labs, Weight trends, I & O's  REASON FOR ASSESSMENT:   Malnutrition Screening Tool    ASSESSMENT:   76 year old male with PMHx of ESRD on HD, HTN, DM type 2, HLD, BPH, GERD admitted with sepsis due to UTI.   Met with patient and family members at bedside. Used Spanish interpreter (Jacqui Lauksitis). Patient reports his appetite is fine and he feels it is unchanged from baseline. He eats 2-3 meals per day. He reports eating meat, potatoes, and fish at meals. Unable to provide any further details about intake or portion sizes. He is no longer drinking his Glucerna at home because he cannot afford it anymore. He started HD in April. He is unsure if he usually takes a renal MVI. Patient reports that they give him a protein bar at dialysis, but he has trouble chewing most of the bars.  Prior to initiating HD patient's UBW was 180-190 lbs. He was 175.2 lbs on 02/17/2017. He was 169.8 lbs on 11/25/2017. Patient reports his dry weight is usually 76 kg. On board patient is currently 80.7 kg - likely volume overloaded from fluid bolus in setting of hypotension.  Medications reviewed and include:  allopurinol, vitamin D 1000 units daily, ferrous sulfate 325 mg BID, Novolog 0-9 units TID, pantoprazole, cefepime, phenylephrine gtt.  Labs reviewed: CBG 103-152, CO2 20, BUN 69, Creatinine 8.87.  Discussed with RN. Patient also discussed on rounds.  NUTRITION - FOCUSED PHYSICAL EXAM:    Most Recent Value  Orbital Region  Moderate depletion  Upper Arm Region  Moderate depletion  Thoracic and Lumbar Region  Moderate depletion  Buccal Region  Moderate depletion  Temple Region  Moderate depletion  Clavicle Bone Region  Moderate depletion  Clavicle and Acromion Bone Region  Moderate depletion  Scapular Bone Region  Moderate depletion  Dorsal Hand  Mild depletion  Patellar Region  Moderate depletion  Anterior Thigh Region  Moderate depletion  Posterior Calf Region  Moderate depletion  Edema (RD Assessment)  Mild  Hair  Reviewed  Eyes  Reviewed  Mouth  Reviewed [poor dentition]  Skin  Reviewed  Nails  Reviewed     Diet Order:  Diet renal with fluid restriction Fluid restriction: 1200 mL Fluid; Room service appropriate? Yes with Assist; Fluid consistency: Thin  EDUCATION NEEDS:   No education needs have been identified at this time  Skin:  Skin Assessment: Reviewed RN Assessment(no issues documented)  Last BM:  Unknown  Height:   Ht Readings from Last 1 Encounters:  11/25/17 '5\' 8"'  (1.727 m)    Weight:   Wt Readings from Last 1 Encounters:  11/25/17 169 lb (76.7 kg)    Ideal Body Weight:  70 kg  BMI:  Body mass index  is 25.7 kg/m.  Estimated Nutritional Needs:   Kcal:  1760-2050 (MSJ x 1.2-1.4)  Protein:  90-105 grams (1.2-1.4 grams/kg)  Fluid:  UOP + 1 L  Willey Blade, MS, RD, LDN Office: [phone] Pager: [phone] After Hours/Weekend Pager: [phone]

## 2017-11-26 NOTE — Progress Notes (Addendum)
[facility] [place] NAME: Martin Gilbert    MR#:  [id number]  DATE OF BIRTH:  March 07, 1941  SUBJECTIVE:  CHIEF COMPLAINT:  Patient is feeling better but feeling weak. Denies any other complaints. Denies any dizziness.  REVIEW OF SYSTEMS:  CONSTITUTIONAL: No fever, fatigue or weakness.  EYES: No blurred or double vision.  EARS, NOSE, AND THROAT: No tinnitus or ear pain.  RESPIRATORY: No cough, shortness of breath, wheezing or hemoptysis.  CARDIOVASCULAR: No chest pain, orthopnea, edema.  GASTROINTESTINAL: No nausea, vomiting, diarrhea or abdominal pain.  GENITOURINARY: No dysuria, hematuria.  ENDOCRINE: No polyuria, nocturia,  HEMATOLOGY: No anemia, easy bruising or bleeding SKIN: No rash or lesion. MUSCULOSKELETAL: No joint pain or arthritis.   NEUROLOGIC: No tingling, numbness, weakness.  PSYCHIATRY: No anxiety or depression.   DRUG ALLERGIES:  No Known Allergies  VITALS:  Blood pressure 96/63, pulse (!) 55, temperature (!) 97.5 F (36.4 C), temperature source Oral, resp. rate 15, height 5\' 8"  (1.727 m), weight 76.7 kg (169 lb), SpO2 97 %.  PHYSICAL EXAMINATION:  GENERAL:  76 y.o.-year-old patient lying in the bed with no acute distress.  EYES: Pupils equal, round, reactive to light and accommodation. No scleral icterus. Extraocular muscles intact.  HEENT: Head atraumatic, normocephalic. Oropharynx and nasopharynx clear.  NECK:  Supple, no jugular venous distention. No thyroid enlargement, no tenderness.  LUNGS: Normal breath sounds bilaterally, no wheezing, rales,rhonchi or crepitation. No use of accessory muscles of respiration.  CARDIOVASCULAR: S1, S2 normal. No murmurs, rubs, or gallops.  ABDOMEN: Soft, nontender, nondistended. Bowel sounds present. No organomegaly or mass.  EXTREMITIES: No pedal edema, cyanosis, or clubbing.  NEUROLOGIC: Cranial nerves II through XII are intact. Muscle strength 5/5 in all  extremities. Sensation intact. Gait not checked.  PSYCHIATRIC: The patient is alert and oriented x 3.  SKIN: No obvious rash, lesion, or ulcer.    LABORATORY PANEL:   CBC Recent Labs  Lab 11/26/17 0539  WBC 21.7*  HGB 10.2*  HCT 30.9*  PLT 113*   ------------------------------------------------------------------------------------------------------------------  Chemistries  Recent Labs  Lab 11/25/17 1716 11/26/17 0539  NA 136 136  K 3.5 4.3  CL 95* 104  CO2 24 20*  GLUCOSE 143* 124*  BUN 62* 69*  CREATININE 8.10* 8.87*  CALCIUM 9.0 8.0*  AST 88*  --   ALT 51  --   ALKPHOS 487*  --   BILITOT 2.0*  --    ------------------------------------------------------------------------------------------------------------------  Cardiac Enzymes Recent Labs  Lab 11/25/17 1716  TROPONINI 0.13*   ------------------------------------------------------------------------------------------------------------------  RADIOLOGY:  Dg Chest 2 View  Result Date: 11/25/2017 CLINICAL DATA:  Fever and generalized weakness.  Fall this morning. EXAM: CHEST  2 VIEW COMPARISON:  08/07/2017 FINDINGS: A left-sided dialysis catheter remains in place with tip near the cavoatrial junction/high right atrium. The cardiac silhouette is upper limits of normal in size. Lung volumes remain diminished with mild linear opacity in the left lung base, less than on the prior study and most suggestive of atelectasis. No overt edema, pleural effusion, or pneumothorax is identified. Thoracic spondylosis is noted. IMPRESSION: Low lung volumes with mild left basilar atelectasis. Electronically Signed   By: Logan Bores M.D.   On: 11/25/2017 08:08   Dg Chest Port 1 View  Result Date: 11/25/2017 CLINICAL DATA:  76 year old male on in stage dialysis. Fever. Subsequent encounter. EXAM: PORTABLE CHEST 1 VIEW COMPARISON:  11/25/2017. FINDINGS: Left central line tip right atrium level.  Cardiomegaly. Left base  subsegmental  atelectasis. No infiltrate, congestive heart failure or pneumothorax. No acute osseous abnormality. IMPRESSION: Left base subsegmental atelectasis. Cardiomegaly. Electronically Signed   By: Genia Del M.D.   On: 11/25/2017 17:34    EKG:   Orders placed or performed during the hospital encounter of 11/25/17  . EKG 12-Lead  . EKG 12-Lead  . EKG 12-Lead  . EKG 12-Lead    ASSESSMENT AND PLAN:   Patient is a 76 year old Hispanic male with end-stage renal disease on hemodialysis with sepsis  1. sepsis likely due to UTI, however this patient also has a vascular access and has a history of PermCath infection broad-spectrum antibiotics vancomycin and Zosyn changed to IV cefepime Follow blood cultures with no growth so far UA is abnormal we'll get urine culture and sensitivity On IV pressors for hypotension, received fluid boluses and  IV fluids neg flu test  2.  End-stage renal disease - consult nephrology to see him for dialysis  3.  History of hypertension currently hypotensive hold antihypertensive  4.  Diabetes type 2- hold Amaryl will place him on sliding scale insulin  5.  BPH continue Flomax  6.  Miscellaneous heparin for DVT prophylaxis       All the records are reviewed and case discussed with Care Management/Social Workerr. Management plans discussed with the patient, with the help of spanish interpreter-Ms Maritza in agreement.  CODE STATUS: fc   TOTAL TIME TAKING CARE OF THIS PATIENT: 36  minutes.   POSSIBLE D/C IN 2-3  DAYS, DEPENDING ON CLINICAL CONDITION.  Note: This dictation was prepared with Dragon dictation along with smaller phrase technology. Any transcriptional errors that result from this process are unintentional.   [name] M.D on 11/26/2017 at 3:14 PM  Between 7am to 6pm - Pager - [phone] After 6pm go to [email or web address] - password [email or web address] [facility]  Office  [phone]  CC: Primary care physician;  [name], MD

## 2017-11-27 DIAGNOSIS — A419 Sepsis, unspecified organism: Principal | ICD-10-CM

## 2017-11-27 LAB — CBC
HCT: 30 % — ABNORMAL LOW (ref 40.0–52.0)
Hemoglobin: 10.1 g/dL — ABNORMAL LOW (ref 13.0–18.0)
MCH: 31.8 pg (ref 26.0–34.0)
MCHC: 33.5 g/dL (ref 32.0–36.0)
MCV: 94.9 fL (ref 80.0–100.0)
PLATELETS: 94 10*3/uL — AB (ref 150–440)
RBC: 3.17 MIL/uL — AB (ref 4.40–5.90)
RDW: 14.4 % (ref 11.5–14.5)
WBC: 12.3 10*3/uL — AB (ref 3.8–10.6)

## 2017-11-27 LAB — GLUCOSE, CAPILLARY
GLUCOSE-CAPILLARY: 200 mg/dL — AB (ref 65–99)
Glucose-Capillary: 225 mg/dL — ABNORMAL HIGH (ref 65–99)
Glucose-Capillary: 236 mg/dL — ABNORMAL HIGH (ref 65–99)
Glucose-Capillary: 311 mg/dL — ABNORMAL HIGH (ref 65–99)

## 2017-11-27 LAB — COMPREHENSIVE METABOLIC PANEL
ALT: 41 U/L (ref 17–63)
AST: 81 U/L — ABNORMAL HIGH (ref 15–41)
Albumin: 2.6 g/dL — ABNORMAL LOW (ref 3.5–5.0)
Alkaline Phosphatase: 288 U/L — ABNORMAL HIGH (ref 38–126)
Anion gap: 13 (ref 5–15)
BUN: 100 mg/dL — ABNORMAL HIGH (ref 6–20)
CHLORIDE: 104 mmol/L (ref 101–111)
CO2: 17 mmol/L — AB (ref 22–32)
CREATININE: 9.71 mg/dL — AB (ref 0.61–1.24)
Calcium: 8.3 mg/dL — ABNORMAL LOW (ref 8.9–10.3)
GFR calc non Af Amer: 5 mL/min — ABNORMAL LOW (ref >60)
GFR, EST AFRICAN AMERICAN: 5 mL/min — AB (ref >60)
Glucose, Bld: 214 mg/dL — ABNORMAL HIGH (ref 65–99)
Potassium: 4.9 mmol/L (ref 3.5–5.1)
SODIUM: 134 mmol/L — AB (ref 135–145)
Total Bilirubin: 1.2 mg/dL (ref 0.3–1.2)
Total Protein: 6.6 g/dL (ref 6.5–8.1)

## 2017-11-27 MED ORDER — ALTEPLASE 2 MG IJ SOLR
2.0000 mg | Freq: Once | INTRAMUSCULAR | Status: DC | PRN
  Filled 2017-11-27 (×2): qty 2

## 2017-11-27 MED ORDER — SODIUM CHLORIDE 0.9 % IV SOLN: 100.0000 mL | INTRAVENOUS | Status: DC | PRN

## 2017-11-27 MED ORDER — INSULIN ASPART 100 UNIT/ML ~~LOC~~ SOLN
0.0000 [IU] | Freq: Every day | SUBCUTANEOUS | Status: DC
  Administered 2017-11-27: 3 [IU] via SUBCUTANEOUS
  Filled 2017-11-27: qty 1

## 2017-11-27 MED ORDER — HEPARIN SODIUM (PORCINE) 1000 UNIT/ML DIALYSIS: 1000.0000 [IU] | INTRAMUSCULAR | Status: DC | PRN

## 2017-11-27 MED ORDER — PENTAFLUOROPROP-TETRAFLUOROETH EX AERO
1.0000 "application " | INHALATION_SPRAY | CUTANEOUS | Status: DC | PRN
  Filled 2017-11-27: qty 30

## 2017-11-27 MED ORDER — INSULIN ASPART 100 UNIT/ML ~~LOC~~ SOLN
0.0000 [IU] | Freq: Three times a day (TID) | SUBCUTANEOUS | Status: DC
  Administered 2017-11-27 – 2017-11-28 (×2): 5 [IU] via SUBCUTANEOUS
  Administered 2017-11-28: 8 [IU] via SUBCUTANEOUS
  Filled 2017-11-27 (×3): qty 1

## 2017-11-27 MED ORDER — EPOETIN ALFA 10000 UNIT/ML IJ SOLN
4000.0000 [IU] | INTRAMUSCULAR | Status: DC
Start: 2017-11-27 — End: 2017-11-28
  Administered 2017-11-27: 4000 [IU] via INTRAVENOUS

## 2017-11-27 MED ORDER — LIDOCAINE HCL (PF) 1 % IJ SOLN
5.0000 mL | INTRAMUSCULAR | Status: DC | PRN
  Filled 2017-11-27: qty 5

## 2017-11-27 MED ORDER — LIDOCAINE-PRILOCAINE 2.5-2.5 % EX CREA
1.0000 "application " | TOPICAL_CREAM | CUTANEOUS | Status: DC | PRN
  Filled 2017-11-27: qty 5

## 2017-11-27 NOTE — Progress Notes (Signed)
HD TX ended  

## 2017-11-27 NOTE — Progress Notes (Signed)
[facility]  ROUNDING NOTE   Subjective:  We are asked to see the patient for evaluation management of end-stage renal disease. He came in with a urinary tract infection. He normally dialyzes on Tuesday, Thursday, Saturday.  The   Objective:  Vital signs in last 24 hours:  Temp:  [97.5 F (36.4 C)-98.5 F (36.9 C)] 98.1 F (36.7 C) (12/13 0800) Pulse Rate:  [44-61] 54 (12/13 0900) Resp:  [13-20] 16 (12/13 0900) BP: (87-130)/(44-63) 130/59 (12/13 0900) SpO2:  [82 %-97 %] 96 % (12/13 0900)  Weight change:  Filed Weights   11/25/17 1715  Weight: 76.7 kg (169 lb)    Intake/Output: I/O last 3 completed shifts: In: 1442.4 [I.V.:892.4; IV Piggyback:550] Out: 100 [Urine:100]   Intake/Output this shift:  Total I/O In: -  Out: 50 [Urine:50]  Physical Exam: General: No acute distress  Head: Normocephalic, atraumatic. Moist oral mucosal membranes  Eyes: Anicteric  Neck: Supple, trachea midline  Lungs:  Clear to auscultation, normal effort  Heart: S1S2 no rubs  Abdomen:  Soft, nontender, bowel sounds present  Extremities: Trace peripheral edema.  Neurologic: Awake, alert, following commands  Skin: No lesions  Access: L IJ PC, maturing LUE AVF    Basic Metabolic Panel: Recent Labs  Lab 11/25/17 0715 11/25/17 1716 11/26/17 0539 11/27/17 0729  NA 134* 136 136 134*  K 4.3 3.5 4.3 4.9  CL 98* 95* 104 104  CO2 20* 24 20* 17*  GLUCOSE 145* 143* 124* 214*  BUN 95* 62* 69* 100*  CREATININE 11.02* 8.10* 8.87* 9.71*  CALCIUM 8.9 9.0 8.0* 8.3*    Liver Function Tests: Recent Labs  Lab 11/25/17 1716 11/27/17 0729  AST 88* 81*  ALT 51 41  ALKPHOS 487* 288*  BILITOT 2.0* 1.2  PROT 8.4* 6.6  ALBUMIN 3.5 2.6*   Recent Labs  Lab 11/25/17 1716  LIPASE 32   No results for input(s): AMMONIA in the last 168 hours.  CBC: Recent Labs  Lab 11/25/17 0715 11/25/17 1716 11/26/17 0539 11/27/17 0729  WBC 7.9 7.5 21.7* 12.3*  NEUTROABS 5.7 6.9*  --   --    HGB 11.0* 12.9* 10.2* 10.1*  HCT 32.8* 38.1* 30.9* 30.0*  MCV 93.9 93.7 94.7 94.9  PLT 158 133* 113* 94*    Cardiac Enzymes: Recent Labs  Lab 11/25/17 1716  TROPONINI 0.13*    BNP: Invalid input(s): POCBNP  CBG: Recent Labs  Lab 11/26/17 0756 11/26/17 1124 11/26/17 1706 11/26/17 2150 11/27/17 0725  GLUCAP 103* 152* 153* 186* 200*    Microbiology: Results for orders placed or performed during the hospital encounter of 11/25/17  Blood Culture (routine x 2)     Status: None (Preliminary result)   Collection Time: 11/25/17  5:16 PM  Result Value Ref Range Status   Specimen Description BLOOD BLOOD RIGHT ARM  Final   Special Requests   Final    BOTTLES DRAWN AEROBIC AND ANAEROBIC Blood Culture adequate volume   Culture NO GROWTH 2 DAYS  Final   Report Status PENDING  Incomplete  Blood Culture (routine x 2)     Status: None (Preliminary result)   Collection Time: 11/25/17  5:16 PM  Result Value Ref Range Status   Specimen Description BLOOD BLOOD RIGHT FOREARM  Final   Special Requests   Final    BOTTLES DRAWN AEROBIC AND ANAEROBIC Blood Culture adequate volume   Culture  Setup Time   Final    Organism ID to follow [place]  BOTTLE ONLY CRITICAL RESULT CALLED TO, READ BACK BY AND VERIFIED WITH: MAT MCBANE ON 11/26/17 AT 2354 JAG    Culture GRAM POSITIVE COCCI  Final   Report Status PENDING  Incomplete  Blood Culture ID Panel (Reflexed)     Status: Abnormal   Collection Time: 11/25/17  5:16 PM  Result Value Ref Range Status   Enterococcus species NOT DETECTED NOT DETECTED Final   Listeria monocytogenes NOT DETECTED NOT DETECTED Final   Staphylococcus species DETECTED (A) NOT DETECTED Final    Comment: Methicillin (oxacillin) susceptible coagulase negative staphylococcus. Possible blood culture contaminant (unless isolated from more than one blood culture draw or clinical case suggests pathogenicity). No antibiotic treatment is indicated for blood   culture contaminants. CRITICAL RESULT CALLED TO, READ BACK BY AND VERIFIED WITH: MAT MCBANE ON 11/26/17 AT 2354 JAG    Staphylococcus aureus NOT DETECTED NOT DETECTED Final   Methicillin resistance NOT DETECTED NOT DETECTED Final   Streptococcus species NOT DETECTED NOT DETECTED Final   Streptococcus agalactiae NOT DETECTED NOT DETECTED Final   Streptococcus pneumoniae NOT DETECTED NOT DETECTED Final   Streptococcus pyogenes NOT DETECTED NOT DETECTED Final   Acinetobacter baumannii NOT DETECTED NOT DETECTED Final   Enterobacteriaceae species NOT DETECTED NOT DETECTED Final   Enterobacter cloacae complex NOT DETECTED NOT DETECTED Final   Escherichia coli NOT DETECTED NOT DETECTED Final   Klebsiella oxytoca NOT DETECTED NOT DETECTED Final   Klebsiella pneumoniae NOT DETECTED NOT DETECTED Final   Proteus species NOT DETECTED NOT DETECTED Final   Serratia marcescens NOT DETECTED NOT DETECTED Final   Haemophilus influenzae NOT DETECTED NOT DETECTED Final   Neisseria meningitidis NOT DETECTED NOT DETECTED Final   Pseudomonas aeruginosa NOT DETECTED NOT DETECTED Final   Candida albicans NOT DETECTED NOT DETECTED Final   Candida glabrata NOT DETECTED NOT DETECTED Final   Candida krusei NOT DETECTED NOT DETECTED Final   Candida parapsilosis NOT DETECTED NOT DETECTED Final   Candida tropicalis NOT DETECTED NOT DETECTED Final  MRSA PCR Screening     Status: None   Collection Time: 11/25/17 10:41 PM  Result Value Ref Range Status   MRSA by PCR NEGATIVE NEGATIVE Final    Comment:        The GeneXpert MRSA Assay (FDA approved for NASAL specimens only), is one component of a comprehensive MRSA colonization surveillance program. It is not intended to diagnose MRSA infection nor to guide or monitor treatment for MRSA infections.   Urine Culture     Status: None (Preliminary result)   Collection Time: 11/26/17  6:00 PM  Result Value Ref Range Status   Specimen Description URINE, RANDOM   Final   Special Requests   Final    NONE Performed at [facility], [place] [address], [place], [place] [address]    Culture PENDING  Incomplete   Report Status PENDING  Incomplete    Coagulation Studies: Recent Labs    11/25/17 1716  LABPROT 13.6  INR 1.05    Urinalysis: Recent Labs    11/25/17 0715  COLORURINE YELLOW*  LABSPEC 1.014  PHURINE 6.0  GLUCOSEU NEGATIVE  HGBUR NEGATIVE  BILIRUBINUR NEGATIVE  KETONESUR NEGATIVE  PROTEINUR 100*  NITRITE NEGATIVE  LEUKOCYTESUR MODERATE*      Imaging: Dg Chest Port 1 View  Result Date: 11/25/2017 CLINICAL DATA:  76 year old male on in stage dialysis. Fever. Subsequent encounter. EXAM: PORTABLE CHEST 1 VIEW COMPARISON:  11/25/2017. FINDINGS: Left central line tip right atrium level.  Cardiomegaly.  Left base subsegmental atelectasis. No infiltrate, congestive heart failure or pneumothorax. No acute osseous abnormality. IMPRESSION: Left base subsegmental atelectasis. Cardiomegaly. Electronically Signed   By: Genia Del M.D.   On: 11/25/2017 17:34     Medications:   . sodium chloride    . ceFEPime (MAXIPIME) IV 1 g (11/26/17 1750)  . phenylephrine (NEO-SYNEPHRINE) Adult infusion Stopped (11/27/17 0301)   . allopurinol  100 mg Oral Daily  . aspirin EC  81 mg Oral Daily  . atorvastatin  20 mg Oral Daily  . cholecalciferol  1,000 Units Oral Daily  . feeding supplement (GLUCERNA SHAKE)  237 mL Oral TID BM  . ferrous sulfate  325 mg Oral BID WC  . heparin injection (subcutaneous)  5,000 Units Subcutaneous Q8H  . hydrocortisone sod succinate (SOLU-CORTEF) inj  50 mg Intravenous Q6H  . insulin aspart  0-9 Units Subcutaneous TID WC  . Melatonin  5 mg Oral QHS  . multivitamin  1 tablet Oral QHS  . pantoprazole  40 mg Oral Daily  . polyvinyl alcohol  2 drop Both Eyes BID  . sodium bicarbonate  650 mg Oral BID  . sodium chloride flush  3 mL Intravenous Q12H  . tamsulosin  0.4 mg Oral QPC supper   sodium chloride,  acetaminophen **OR** acetaminophen, ondansetron **OR** ondansetron (ZOFRAN) IV, sodium chloride flush  Assessment/ Plan:  76 y.o.  Hispanic male with end stage renal disease on hemodialysis, diabetes mellitus type II, hypertension, hepatic cirrhosis, BPH, gout, diabetic peripheral neuropathy  TTS [facility] Left IJ permcath  1. End Stage Renal Disease:   -We are planning for hemodialysis today.  Orders have been prepared.  We will hold off on ultrafiltration as he just came off of pressor therapy.  2.  Hypotension.  Patient now off of pressors.  Monitor blood pressure closely during dialysis.  3.  Secondary Hyperparathyroidism:   -Check intact PTH and phosphorus with dialysis treatment today.  4. Anemia of chronic kidney disease: Hemoglobin currently 10.1.  Start patient on Epogen 4000 units with dialysis.     LOS: 2 Ladislav Caselli 12/13/20189:40 AM

## 2017-11-27 NOTE — Progress Notes (Signed)
Pharmacy Antibiotic Note  Martin Gilbert is a 76 y.o. male with a h/o ESRD on HD admitted on 11/25/2017 with bacteremia from UTI.  Pharmacy has been consulted for cefepime dosing.   Plan: Will continue cefepime 1 g iv q 24 hours.   Height: 5\' 8"  (172.7 cm) Weight: 169 lb (76.7 kg) IBW/kg (Calculated) : 68.4  Temp (24hrs), Avg:97.9 F (36.6 C), Min:97.5 F (36.4 C), Max:98.5 F (36.9 C)  Recent Labs  Lab 11/25/17 0715 11/25/17 0912 11/25/17 1716 11/25/17 2054 11/26/17 0539 11/27/17 0729  WBC 7.9  --  7.5  --  21.7* 12.3*  CREATININE 11.02*  --  8.10*  --  8.87* 9.71*  LATICACIDVEN  --  1.1 3.1* 1.4  --   --     Estimated Creatinine Clearance: 6.2 mL/min (A) (by C-G formula based on SCr of 9.71 mg/dL (H)).    No Known Allergies  Antimicrobials this admission: ceftriaxone 12/11 x 1 cefepime 12/11 >>   Dose adjustments this admission:   Microbiology results: 12/11 AM BCx: Enterobacter cloacae 1/2 12/11 PM BCx: Staph species 1/2 12/11 UCx: NG 12/12 UCx: pending 12/11 MRSA PCR: negative  Thank you for allowing pharmacy to be a part of this patient's care.  Ulice Dash D 11/27/2017 8:34 AM

## 2017-11-27 NOTE — Progress Notes (Signed)
Inpatient Diabetes Program Recommendations  AACE/ADA: New Consensus Statement on Inpatient Glycemic Control (2015)  Target Ranges:  Prepandial:   less than 140 mg/dL      Peak postprandial:   less than 180 mg/dL (1-2 hours)      Critically ill patients:  140 - 180 mg/dL   Lab Results  Component Value Date   GLUCAP 200 (H) 11/27/2017   HGBA1C 5.2 02/15/2017    Review of Glycemic Control  Results for KITAI, PURDOM (MRN [id number]) as of 11/27/2017 11:28  Ref. Range 11/26/2017 07:56 11/26/2017 11:24 11/26/2017 17:06 11/26/2017 21:50 11/27/2017 07:25  Glucose-Capillary Latest Ref Range: 65 - 99 mg/dL 103 (H) 152 (H) 153 (H) 186 (H) 200 (H)    Diabetes history: Type 2 Outpatient Diabetes medications: Amaryl 2mg  bid Current orders for Inpatient glycemic control: Novolog 0-5 units qhs, Novolog 0-15 units tid           * Solu-medrol IV q6h  Inpatient Diabetes Program Recommendations: Consider adding low dose basal insulin, Lantus 16 units qhs (0.2unit/kg)- decrease as steroids are tapered.  Consider adding mealtime Novolog 3 units tid with meals (hold if patient eats less than 50%)- decrease as steroids are decreased.       Gentry Fitz, RN, BA, MHA, CDE Diabetes Coordinator Inpatient Diabetes Program  [phone] (Team Pager) [phone] ([place]) 11/27/2017 11:31 AM

## 2017-11-27 NOTE — Progress Notes (Signed)
Name: Martin Gilbert MRN: [id number] DOB: 11-12-1941    ADMISSION DATE:  11/25/2017  CONSULTATION DATE:  11/26/17  REFERRING MD :  Dr. Estanislado Pandy  CHIEF COMPLAINT:  "Emesis"  BRIEF PATIENT DESCRIPTION: 76 year old male with multiple co-morbidities, admitted with sepsis secondary to UTI .  Patient transferred from the floor due to hypotension not responding to fluids requiring pressors.  SIGNIFICANT EVENTS  12/11 Patient admitted to [facility] with sepsis related to UTI 12/11 Patient transferred from the floor due to hypotension not responding to fluids, may require pressors 12/13>OFF pressors; transfer to telemetry  STUDIES:  03/27/17 ECHO>> The cavity size was normal. There was mild  concentric hypertrophy. Systolic function was normal. The   estimated ejection fraction was in the range of 60% to 65%   HISTORY OF PRESENT ILLNESS: Martin Gilbert is a 76 year old with known history of BPH,CKD stage IV,DM,GERD, Hypertension and Hyperlipidemia, ESRD  With Tue- Thurs- Saturday dialysis.  Patient admitted on 12/11 to [facility] due to UTI. Patient was transferred from the med-surg unit due to hypotension not responding to fluids.  He received 4liters of fluid but remains hypotensive.  Therefore patient was transferred to the SDU for closer monitoring and initiating vasopressors.  SUBJECTIVE: Patient is awake and eating lunch. Family at bedside. Offers no complaints. Off pressors, WBC down. No fever.   VITAL SIGNS: Temp:  [97.6 F (36.4 C)-98.5 F (36.9 C)] 98.1 F (36.7 C) (12/13 0800) Pulse Rate:  [44-61] 51 (12/13 1100) Resp:  [13-20] 13 (12/13 1100) BP: (87-137)/(44-63) 136/49 (12/13 1100) SpO2:  [82 %-97 %] 92 % (12/13 1100)  PHYSICAL EXAMINATION: General:  Well nourished, NAD Neuro:  AAO X 3, CN intact HEENT:  PERRLA,  Neck is supple without JVD Cardiovascular: AP mildly bradycardic, S1S2,regular, no m/r/g Lungs: Bilateral breath sounds, diminished in the bases , no  wheezes,crackles and rhonchi Abdomen:  Soft,NT,ND, + BS X4 Musculoskeletal:  No edema,cyanosis Skin:  Warm,dry and intact  Recent Labs  Lab 11/25/17 1716 11/26/17 0539 11/27/17 0729  NA 136 136 134*  K 3.5 4.3 4.9  CL 95* 104 104  CO2 24 20* 17*  BUN 62* 69* 100*  CREATININE 8.10* 8.87* 9.71*  GLUCOSE 143* 124* 214*   Recent Labs  Lab 11/25/17 1716 11/26/17 0539 11/27/17 0729  HGB 12.9* 10.2* 10.1*  HCT 38.1* 30.9* 30.0*  WBC 7.5 21.7* 12.3*  PLT 133* 113* 94*   Dg Chest Port 1 View  Result Date: 11/25/2017 CLINICAL DATA:  76 year old male on in stage dialysis. Fever. Subsequent encounter. EXAM: PORTABLE CHEST 1 VIEW COMPARISON:  11/25/2017. FINDINGS: Left central line tip right atrium level.  Cardiomegaly. Left base subsegmental atelectasis. No infiltrate, congestive heart failure or pneumothorax. No acute osseous abnormality. IMPRESSION: Left base subsegmental atelectasis. Cardiomegaly. Electronically Signed   By: Genia Del M.D.   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 [facility] ANP-BC Pulmonary and Critical Care Medicine [facility] Pager [phone] or [phone]    11/27/2017, 12:21  PM

## 2017-11-27 NOTE — Progress Notes (Signed)
POST HD 

## 2017-11-27 NOTE — Progress Notes (Signed)
HD TX started  

## 2017-11-27 NOTE — Progress Notes (Signed)
Pre HD  

## 2017-11-27 NOTE — Progress Notes (Signed)
Transferred to 2-C as ordered.  Pt and family informed of room change.  Report called to Remo Lipps, Therapist, sports.  Pt left floor via wheelchair with all bersonal belongings.

## 2017-11-27 NOTE — Care Management (Signed)
Per staff patient gets around fine and therefore will not need PT evaluation.

## 2017-11-27 NOTE — Progress Notes (Signed)
[facility] NAME: Martin Gilbert    MR#:  [id number]  DATE OF BIRTH:  Sep 23, 1941  SUBJECTIVE:  CHIEF COMPLAINT:  Patient is feeling better. Off pressors Denies any other complaints. Family bed side  REVIEW OF SYSTEMS:  CONSTITUTIONAL: No fever, fatigue or weakness.  EYES: No blurred or double vision.  EARS, NOSE, AND THROAT: No tinnitus or ear pain.  RESPIRATORY: No cough, shortness of breath, wheezing or hemoptysis.  CARDIOVASCULAR: No chest pain, orthopnea, edema.  GASTROINTESTINAL: No nausea, vomiting, diarrhea or abdominal pain.  GENITOURINARY: No dysuria, hematuria.  ENDOCRINE: No polyuria, nocturia,  HEMATOLOGY: No anemia, easy bruising or bleeding SKIN: No rash or lesion. MUSCULOSKELETAL: No joint pain or arthritis.   NEUROLOGIC: No tingling, numbness, weakness.  PSYCHIATRY: No anxiety or depression.   DRUG ALLERGIES:  No Known Allergies  VITALS:  Blood pressure (!) 123/53, pulse (!) 52, temperature 98.1 F (36.7 C), temperature source Oral, resp. rate 16, height 5\' 8"  (1.727 m), weight 76.7 kg (169 lb), SpO2 95 %.  PHYSICAL EXAMINATION:  GENERAL:  76 y.o.-year-old patient lying in the bed with no acute distress.  EYES: Pupils equal, round, reactive to light and accommodation. No scleral icterus. Extraocular muscles intact.  HEENT: Head atraumatic, normocephalic. Oropharynx and nasopharynx clear.  NECK:  Supple, no jugular venous distention. No thyroid enlargement, no tenderness.  LUNGS: Normal breath sounds bilaterally, no wheezing, rales,rhonchi or crepitation. No use of accessory muscles of respiration.  CARDIOVASCULAR: S1, S2 normal. No murmurs, rubs, or gallops.  ABDOMEN: Soft, nontender, nondistended. Bowel sounds present. No organomegaly or mass.  EXTREMITIES: No pedal edema, cyanosis, or clubbing.  NEUROLOGIC: Cranial nerves II through XII are intact. Muscle strength 5/5 in all extremities.  Sensation intact. Gait not checked.  PSYCHIATRIC: The patient is alert and oriented x 3.  SKIN: No obvious rash, lesion, or ulcer.    LABORATORY PANEL:   CBC Recent Labs  Lab 11/27/17 0729  WBC 12.3*  HGB 10.1*  HCT 30.0*  PLT 94*   ------------------------------------------------------------------------------------------------------------------  Chemistries  Recent Labs  Lab 11/27/17 0729  NA 134*  K 4.9  CL 104  CO2 17*  GLUCOSE 214*  BUN 100*  CREATININE 9.71*  CALCIUM 8.3*  AST 81*  ALT 41  ALKPHOS 288*  BILITOT 1.2   ------------------------------------------------------------------------------------------------------------------  Cardiac Enzymes Recent Labs  Lab 11/25/17 1716  TROPONINI 0.13*   ------------------------------------------------------------------------------------------------------------------  RADIOLOGY:  Dg Chest Port 1 View  Result Date: 11/25/2017 CLINICAL DATA:  76 year old male on in stage dialysis. Fever. Subsequent encounter. EXAM: PORTABLE CHEST 1 VIEW COMPARISON:  11/25/2017. FINDINGS: Left central line tip right atrium level.  Cardiomegaly. Left base subsegmental atelectasis. No infiltrate, congestive heart failure or pneumothorax. No acute osseous abnormality. IMPRESSION: Left base subsegmental atelectasis. Cardiomegaly. Electronically Signed   By: Martin Gilbert M.D.   On: 11/25/2017 17:34    EKG:   Orders placed or performed during the hospital encounter of 11/25/17  . EKG 12-Lead  . EKG 12-Lead  . EKG 12-Lead  . EKG 12-Lead  . EKG 12-Lead  . EKG 12-Lead    ASSESSMENT AND PLAN:   Patient is a 76 year old Hispanic male with end-stage renal disease on hemodialysis with sepsis  1. sepsis likely due to UTI, however this patient also has a vascular access and has a history of PermCath infection broad-spectrum antibiotics vancomycin and Zosyn changed to IV cefepime  blood cultures from 11/25/2017 with Staphylococcus and  Enterobacter Repeat blood  cultures from 11/27/2017 are pending  Urine culture result is pending Off pressors Flu test is negative  2.  End-stage renal disease - nephrology is following For hemodialysis today  3.  History of hypertension currently hypotensive hold antihypertensive  4.  Diabetes type 2- hold Amaryl will place him on sliding scale insulin  5.  BPH continue Flomax  6.  Miscellaneous heparin for DVT prophylaxis       All the records are reviewed and case discussed with Care Management/Social Workerr. Management plans discussed with the patient, wife and son at bedside in agreement.  CODE STATUS: fc   TOTAL TIME TAKING CARE OF THIS PATIENT: 36  minutes.   POSSIBLE D/C IN 2-3  DAYS, DEPENDING ON CLINICAL CONDITION.  Note: This dictation was prepared with Dragon dictation along with smaller phrase technology. Any transcriptional errors that result from this process are unintentional.   Martin Gilbert M.D on 11/27/2017 at 3:22 PM  Between 7am to 6pm - Pager - [phone] After 6pm go to [email or web address] - password [email or web address] [facility]  Office  [phone]  CC: Primary care physician; Martin Median, MD

## 2017-11-27 NOTE — Progress Notes (Signed)
ANTIBIOTIC CONSULT NOTE - INITIAL  Pharmacy Consult for cefepime Indication: uti  No Known Allergies  Patient Measurements: Height: 5\' 8"  (172.7 cm) Weight: 169 lb (76.7 kg) IBW/kg (Calculated) : 68.4 Adjusted Body Weight:   Vital Signs: Temp: 97.8 F (36.6 C) (12/12 1720) Temp Source: Oral (12/12 1720) BP: 90/49 (12/12 1700) Pulse Rate: 58 (12/12 1720) Intake/Output from previous day: 12/12 0701 - 12/13 0700 In: 729.4 [I.V.:679.4; IV Piggyback:50] Out: -  Intake/Output from this shift: No intake/output data recorded.  Labs: Recent Labs    11/25/17 0715 11/25/17 1716 11/26/17 0539  WBC 7.9 7.5 21.7*  HGB 11.0* 12.9* 10.2*  PLT 158 133* 113*  CREATININE 11.02* 8.10* 8.87*   Estimated Creatinine Clearance: 6.7 mL/min (A) (by C-G formula based on SCr of 8.87 mg/dL (H)). No results for input(s): VANCOTROUGH, VANCOPEAK, VANCORANDOM, GENTTROUGH, GENTPEAK, GENTRANDOM, TOBRATROUGH, TOBRAPEAK, TOBRARND, AMIKACINPEAK, AMIKACINTROU, AMIKACIN in the last 72 hours.   Microbiology: Recent Results (from the past 720 hour(s))  Urine Culture     Status: None   Collection Time: 11/25/17  7:15 AM  Result Value Ref Range Status   Specimen Description URINE, RANDOM  Final   Special Requests NONE  Final   Culture   Final    NO GROWTH Performed at [facility], [address], [place], [place] [address]    Report Status 11/26/2017 FINAL  Final  Blood culture (routine x 2)     Status: None (Preliminary result)   Collection Time: 11/25/17  8:09 AM  Result Value Ref Range Status   Specimen Description BLOOD FOREARM  Final   Special Requests   Final    BOTTLES DRAWN AEROBIC AND ANAEROBIC Blood Culture adequate volume   Culture  Setup Time   Final    GRAM NEGATIVE RODS ANAEROBIC BOTTLE ONLY CRITICAL VALUE NOTED.  VALUE IS CONSISTENT WITH PREVIOUSLY REPORTED AND CALLED VALUE.    Culture GRAM NEGATIVE RODS  Final   Report Status PENDING  Incomplete  Blood culture (routine x  2)     Status: None (Preliminary result)   Collection Time: 11/25/17  8:09 AM  Result Value Ref Range Status   Specimen Description BLOOD LEFT ANTECUBITAL  Final   Special Requests   Final    BOTTLES DRAWN AEROBIC AND ANAEROBIC Blood Culture adequate volume   Culture  Setup Time   Final    GRAM NEGATIVE RODS IN BOTH AEROBIC AND ANAEROBIC BOTTLES CRITICAL RESULT CALLED TO, READ BACK BY AND VERIFIED WITH: MAT [place] ON 11/26/17 AT 0132 JAG    Culture GRAM NEGATIVE RODS  Final   Report Status PENDING  Incomplete  Blood Culture ID Panel (Reflexed)     Status: Abnormal   Collection Time: 11/25/17  8:09 AM  Result Value Ref Range Status   Enterococcus species NOT DETECTED NOT DETECTED Final   Listeria monocytogenes NOT DETECTED NOT DETECTED Final   Staphylococcus species NOT DETECTED NOT DETECTED Final   Staphylococcus aureus NOT DETECTED NOT DETECTED Final   Streptococcus species NOT DETECTED NOT DETECTED Final   Streptococcus agalactiae NOT DETECTED NOT DETECTED Final   Streptococcus pneumoniae NOT DETECTED NOT DETECTED Final   Streptococcus pyogenes NOT DETECTED NOT DETECTED Final   Acinetobacter baumannii NOT DETECTED NOT DETECTED Final   Enterobacteriaceae species DETECTED (A) NOT DETECTED Final    Comment: Enterobacteriaceae represent a large family of gram-negative bacteria, not a single organism. CRITICAL RESULT CALLED TO, READ BACK BY AND VERIFIED WITH: MAT Kariah [place] ON 11/26/17 AT 0132  JAG    Enterobacter cloacae complex DETECTED (A) NOT DETECTED Final    Comment: CRITICAL RESULT CALLED TO, READ BACK BY AND VERIFIED WITH: MAT Jo-Ann Johanning ON 11/26/17 AT 0132 JAG    Escherichia coli NOT DETECTED NOT DETECTED Final   Klebsiella oxytoca NOT DETECTED NOT DETECTED Final   Klebsiella pneumoniae NOT DETECTED NOT DETECTED Final   Proteus species NOT DETECTED NOT DETECTED Final   Serratia marcescens NOT DETECTED NOT DETECTED Final   Carbapenem resistance NOT DETECTED NOT DETECTED Final    Haemophilus influenzae NOT DETECTED NOT DETECTED Final   Neisseria meningitidis NOT DETECTED NOT DETECTED Final   Pseudomonas aeruginosa NOT DETECTED NOT DETECTED Final   Candida albicans NOT DETECTED NOT DETECTED Final   Candida glabrata NOT DETECTED NOT DETECTED Final   Candida krusei NOT DETECTED NOT DETECTED Final   Candida parapsilosis NOT DETECTED NOT DETECTED Final   Candida tropicalis NOT DETECTED NOT DETECTED Final  Blood Culture (routine x 2)     Status: None (Preliminary result)   Collection Time: 11/25/17  5:16 PM  Result Value Ref Range Status   Specimen Description BLOOD BLOOD RIGHT ARM  Final   Special Requests   Final    BOTTLES DRAWN AEROBIC AND ANAEROBIC Blood Culture adequate volume   Culture NO GROWTH < 24 HOURS  Final   Report Status PENDING  Incomplete  Blood Culture (routine x 2)     Status: None (Preliminary result)   Collection Time: 11/25/17  5:16 PM  Result Value Ref Range Status   Specimen Description BLOOD BLOOD RIGHT FOREARM  Final   Special Requests   Final    BOTTLES DRAWN AEROBIC AND ANAEROBIC Blood Culture adequate volume   Culture  Setup Time   Final    Organism ID to follow GRAM POSITIVE COCCI ANAEROBIC BOTTLE ONLY CRITICAL RESULT CALLED TO, READ BACK BY AND VERIFIED WITH: MAT [place] ON 11/26/17 AT 2354 JAG    Culture GRAM POSITIVE COCCI  Final   Report Status PENDING  Incomplete  Blood Culture ID Panel (Reflexed)     Status: Abnormal   Collection Time: 11/25/17  5:16 PM  Result Value Ref Range Status   Enterococcus species NOT DETECTED NOT DETECTED Final   Listeria monocytogenes NOT DETECTED NOT DETECTED Final   Staphylococcus species DETECTED (A) NOT DETECTED Final    Comment: Methicillin (oxacillin) susceptible coagulase negative staphylococcus. Possible blood culture contaminant (unless isolated from more than one blood culture draw or clinical case suggests pathogenicity). No antibiotic treatment is indicated for blood  culture  contaminants. CRITICAL RESULT CALLED TO, READ BACK BY AND VERIFIED WITH: MAT Rasheen Bells ON 11/26/17 AT 2354 JAG    Staphylococcus aureus NOT DETECTED NOT DETECTED Final   Methicillin resistance NOT DETECTED NOT DETECTED Final   Streptococcus species NOT DETECTED NOT DETECTED Final   Streptococcus agalactiae NOT DETECTED NOT DETECTED Final   Streptococcus pneumoniae NOT DETECTED NOT DETECTED Final   Streptococcus pyogenes NOT DETECTED NOT DETECTED Final   Acinetobacter baumannii NOT DETECTED NOT DETECTED Final   Enterobacteriaceae species NOT DETECTED NOT DETECTED Final   Enterobacter cloacae complex NOT DETECTED NOT DETECTED Final   Escherichia coli NOT DETECTED NOT DETECTED Final   Klebsiella oxytoca NOT DETECTED NOT DETECTED Final   Klebsiella pneumoniae NOT DETECTED NOT DETECTED Final   Proteus species NOT DETECTED NOT DETECTED Final   Serratia marcescens NOT DETECTED NOT DETECTED Final   Haemophilus influenzae NOT DETECTED NOT DETECTED Final   Neisseria meningitidis NOT DETECTED NOT  DETECTED Final   Pseudomonas aeruginosa NOT DETECTED NOT DETECTED Final   Candida albicans NOT DETECTED NOT DETECTED Final   Candida glabrata NOT DETECTED NOT DETECTED Final   Candida krusei NOT DETECTED NOT DETECTED Final   Candida parapsilosis NOT DETECTED NOT DETECTED Final   Candida tropicalis NOT DETECTED NOT DETECTED Final  MRSA PCR Screening     Status: None   Collection Time: 11/25/17 10:41 PM  Result Value Ref Range Status   MRSA by PCR NEGATIVE NEGATIVE Final    Comment:        The GeneXpert MRSA Assay (FDA approved for NASAL specimens only), is one component of a comprehensive MRSA colonization surveillance program. It is not intended to diagnose MRSA infection nor to guide or monitor treatment for MRSA infections.   Urine Culture     Status: None (Preliminary result)   Collection Time: 11/26/17  6:00 PM  Result Value Ref Range Status   Specimen Description URINE, RANDOM  Final    Special Requests   Final    NONE Performed at [facility], [place] [address], [place], [place] [address]    Culture PENDING  Incomplete   Report Status PENDING  Incomplete    Medical History: Past Medical History:  Diagnosis Date  . Anemia   . Back pain   . BPH (benign prostatic hyperplasia)   . CKD (chronic kidney disease), stage IV ([place])   . Clostridium difficile colitis   . Diabetes mellitus without complication ([place])   . GERD (gastroesophageal reflux disease)   . HTN (hypertension)   . Hyperlipidemia     Medications:  Facility-Administered Medications Prior to Admission  Medication Dose Route Frequency Provider Last Rate Last Dose  . vancomycin (VANCOCIN) 1,000 mg in sodium chloride 0.9 % 500 mL IVPB  1,000 mg Intravenous Once Stegmayer, Kimberly A, PA-C       Medications Prior to Admission  Medication Sig Dispense Refill Last Dose  . allopurinol (ZYLOPRIM) 100 MG tablet Take 100 mg by mouth daily.   11/24/2017 at Unknown time  . aspirin 81 MG tablet Take 81 mg by mouth daily.   11/24/2017 at Unknown time  . atorvastatin (LIPITOR) 20 MG tablet Take 20 mg by mouth daily.   11/24/2017 at Unknown time  . cephALEXin (KEFLEX) 500 MG capsule Take 1 capsule (500 mg total) by mouth 3 (three) times daily for 7 days. 21 capsule 0   . feeding supplement, GLUCERNA SHAKE, (GLUCERNA SHAKE) LIQD Take 237 mLs by mouth 3 (three) times daily between meals. (Patient not taking: Reported on 11/25/2017) 90 Can 6 Not Taking at Unknown time  . ferrous sulfate 325 (65 FE) MG tablet Take 325 mg by mouth 2 (two) times daily with a meal.   11 11/24/2017 at Unknown time  . glimepiride (AMARYL) 2 MG tablet Take 2 mg by mouth 2 (two) times daily.    11/24/2017 at Unknown time  . hydrALAZINE (APRESOLINE) 50 MG tablet Take 50 mg by mouth 3 (three) times daily.   11/24/2017 at Unknown time  . HYDROcodone-acetaminophen (NORCO) 5-325 MG tablet Take 1 tablet by mouth every 6 (six) hours as needed for  moderate pain or severe pain. (Patient not taking: Reported on 11/25/2017) 25 tablet 0 Not Taking at Unknown time  . lidocaine-prilocaine (EMLA) cream Apply 1 application topically daily as needed.   prn at prn  . Melatonin 5 MG TABS Take 1 tablet (5 mg total) by mouth at bedtime. (Patient not taking: Reported  on 11/25/2017) 30 tablet 0 Not Taking at Unknown time  . metoprolol succinate (TOPROL-XL) 25 MG 24 hr tablet Take 25 mg by mouth daily.   11/24/2017 at Unknown time  . ondansetron (ZOFRAN) 4 MG tablet Take 1 tablet (4 mg total) by mouth every 6 (six) hours as needed for nausea. (Patient not taking: Reported on 11/25/2017) 20 tablet 0 Not Taking at Unknown time  . pantoprazole (PROTONIX) 40 MG tablet Take 1 tablet (40 mg total) by mouth daily. 30 tablet 6 11/24/2017 at Unknown time  . Polyvinyl Alcohol-Povidone (REFRESH OP) Place 1 drop into both eyes 2 (two) times daily.   prn at prn  . sodium bicarbonate 650 MG tablet Take 650 mg by mouth 2 (two) times daily.   11/24/2017 at Unknown time  . tamsulosin (FLOMAX) 0.4 MG CAPS capsule Take 1 capsule (0.4 mg total) by mouth daily after supper. 30 capsule 0 11/24/2017 at Unknown time  . Vancomycin (VANCOCIN) 750-5 MG/150ML-% SOLN Inject 150 mLs (750 mg total) into the vein Every Tuesday,Thursday,and Saturday with dialysis. 4000 mL 0 Past Week at Unknown time  . Vitamin D, Cholecalciferol, 1000 UNITS TABS Take 1 tablet by mouth daily.    11/24/2017 at Unknown time   Scheduled:  . allopurinol  100 mg Oral Daily  . aspirin EC  81 mg Oral Daily  . atorvastatin  20 mg Oral Daily  . cholecalciferol  1,000 Units Oral Daily  . feeding supplement (GLUCERNA SHAKE)  237 mL Oral TID BM  . ferrous sulfate  325 mg Oral BID WC  . heparin injection (subcutaneous)  5,000 Units Subcutaneous Q8H  . hydrocortisone sod succinate (SOLU-CORTEF) inj  50 mg Intravenous Q6H  . insulin aspart  0-9 Units Subcutaneous TID WC  . Melatonin  5 mg Oral QHS  . multivitamin  1  tablet Oral QHS  . pantoprazole  40 mg Oral Daily  . polyvinyl alcohol  2 drop Both Eyes BID  . sodium bicarbonate  650 mg Oral BID  . sodium chloride flush  3 mL Intravenous Q12H  . tamsulosin  0.4 mg Oral QPC supper   Assessment: Pharmacy to dose and monitor cefepime. Patient received cefepime 1 g in ED.   Goal of Therapy:    Plan:  Will start Cefepime 500 mg IV q24 hours for possible uti.   12/12 0200 Patient now with 1/4 bottles GNR, E. cloacae. On pressor therapy with transfer to CCU. Will increase cefepime to 1 gram daily.  12/13 0000 New draw BCID Staph spp. mecA(-). CCM NP notified. Continue current regimen.  Kimimila Tauzin S 11/27/2017,1:02 AM

## 2017-11-28 ENCOUNTER — Other Ambulatory Visit: Payer: Self-pay

## 2017-11-28 LAB — CULTURE, BLOOD (ROUTINE X 2)
SPECIAL REQUESTS: ADEQUATE
Special Requests: ADEQUATE

## 2017-11-28 LAB — COMPREHENSIVE METABOLIC PANEL
ALT: 38 U/L (ref 17–63)
ANION GAP: 12 (ref 5–15)
AST: 68 U/L — AB (ref 15–41)
Albumin: 2.5 g/dL — ABNORMAL LOW (ref 3.5–5.0)
Alkaline Phosphatase: 311 U/L — ABNORMAL HIGH (ref 38–126)
BILIRUBIN TOTAL: 0.7 mg/dL (ref 0.3–1.2)
BUN: 54 mg/dL — AB (ref 6–20)
CHLORIDE: 100 mmol/L — AB (ref 101–111)
CO2: 24 mmol/L (ref 22–32)
Calcium: 8.7 mg/dL — ABNORMAL LOW (ref 8.9–10.3)
Creatinine, Ser: 5.6 mg/dL — ABNORMAL HIGH (ref 0.61–1.24)
GFR, EST AFRICAN AMERICAN: 10 mL/min — AB (ref >60)
GFR, EST NON AFRICAN AMERICAN: 9 mL/min — AB (ref >60)
Glucose, Bld: 276 mg/dL — ABNORMAL HIGH (ref 65–99)
POTASSIUM: 4 mmol/L (ref 3.5–5.1)
Sodium: 136 mmol/L (ref 135–145)
TOTAL PROTEIN: 6.7 g/dL (ref 6.5–8.1)

## 2017-11-28 LAB — GLUCOSE, CAPILLARY
GLUCOSE-CAPILLARY: 250 mg/dL — AB (ref 65–99)
GLUCOSE-CAPILLARY: 268 mg/dL — AB (ref 65–99)
Glucose-Capillary: 248 mg/dL — ABNORMAL HIGH (ref 65–99)

## 2017-11-28 LAB — CBC
HEMATOCRIT: 30.3 % — AB (ref 40.0–52.0)
Hemoglobin: 10 g/dL — ABNORMAL LOW (ref 13.0–18.0)
MCH: 31.4 pg (ref 26.0–34.0)
MCHC: 33.1 g/dL (ref 32.0–36.0)
MCV: 94.8 fL (ref 80.0–100.0)
PLATELETS: 105 10*3/uL — AB (ref 150–440)
RBC: 3.19 MIL/uL — ABNORMAL LOW (ref 4.40–5.90)
RDW: 14.1 % (ref 11.5–14.5)
WBC: 9.9 10*3/uL (ref 3.8–10.6)

## 2017-11-28 LAB — URINE CULTURE

## 2017-11-28 MED ORDER — CEPHALEXIN 500 MG PO CAPS: 500.0000 mg | ORAL_CAPSULE | Freq: Three times a day (TID) | ORAL | 0 refills | Status: DC

## 2017-11-28 MED ORDER — PREDNISONE 10 MG (21) PO TBPK: 10.0000 mg | ORAL_TABLET | Freq: Every day | ORAL | 0 refills | Status: DC

## 2017-11-28 MED ORDER — RENA-VITE PO TABS: 1.0000 | ORAL_TABLET | Freq: Every day | ORAL | 0 refills | Status: DC

## 2017-11-28 MED ORDER — INSULIN LISPRO 100 UNIT/ML ~~LOC~~ SOLN: 3.0000 [IU] | Freq: Three times a day (TID) | SUBCUTANEOUS | 0 refills | Status: DC

## 2017-11-28 MED ORDER — INSULIN ASPART 100 UNIT/ML ~~LOC~~ SOLN
3.0000 [IU] | Freq: Three times a day (TID) | SUBCUTANEOUS | Status: DC
  Administered 2017-11-28: 3 [IU] via SUBCUTANEOUS
  Filled 2017-11-28: qty 1

## 2017-11-28 MED ORDER — INSULIN GLARGINE 100 UNIT/ML ~~LOC~~ SOLN: 12.0000 [IU] | Freq: Every day | SUBCUTANEOUS | 0 refills | Status: DC

## 2017-11-28 MED ORDER — ACETAMINOPHEN 325 MG PO TABS: 650.0000 mg | ORAL_TABLET | Freq: Four times a day (QID) | ORAL | Status: DC | PRN

## 2017-11-28 MED ORDER — CIPROFLOXACIN HCL 500 MG PO TABS: 500.0000 mg | ORAL_TABLET | Freq: Every day | ORAL | 0 refills | Status: AC

## 2017-11-28 MED ORDER — INSULIN GLARGINE 100 UNIT/ML ~~LOC~~ SOLN
12.0000 [IU] | Freq: Every day | SUBCUTANEOUS | Status: DC
  Administered 2017-11-28: 12 [IU] via SUBCUTANEOUS
  Filled 2017-11-28 (×2): qty 0.12

## 2017-11-28 NOTE — Discharge Summary (Addendum)
[facility] NAME: Martin Gilbert    MR#:  [id number]  DATE OF BIRTH:  Jan 07, 1941  DATE OF ADMISSION:  11/25/2017 ADMITTING PHYSICIAN: Dustin Flock, MD  DATE OF DISCHARGE: 11/28/17  PRIMARY CARE PHYSICIAN: [name], MD    ADMISSION DIAGNOSIS:  End-stage renal disease on hemodialysis ([place]) [N18.6, Z99.2] Cystitis [N30.90] Sepsis, due to unspecified organism ([place]) [A41.9]  DISCHARGE DIAGNOSIS:  Active Problems:   Sepsis ([place]) ESRD  SECONDARY DIAGNOSIS:   Past Medical History:  Diagnosis Date  . Anemia   . Back pain   . BPH (benign prostatic hyperplasia)   . CKD (chronic kidney disease), stage IV ([place])   . Clostridium difficile colitis   . Diabetes mellitus without complication ([place])   . GERD (gastroesophageal reflux disease)   . HTN (hypertension)   . Hyperlipidemia     HOSPITAL COURSE:   HISTORY OF PRESENT ILLNESS: Martin Gilbert  is a 76 y.o. male with a known history of end-stage renal disease on hemodialysis Tuesday Thursday and Saturday, essential hypertension, diabetes who was seen earlier today in the emergency room for evaluation of fever.  Patient's daughter who was next to him stated that he went to dialysis 3 days ago and returned home with the fever.  They did not measure his temperature.  They gave him Tylenol.  Patient subsequently started having generalized weakness.  Patient did fall this morning.  He was seen in the emergency room.  And was noted to have a urinary tract infection.  He was doing well therefore he was discharged on Ceftin.  Patient continued to have fevers at home so he is brought into the hospital back with fevers and generalized weakness.   1.sepsis likely due to UTI,however this patient also has a vascular access and has a history of PermCath infection broad-spectrum antibiotics vancomycin and Zosyn changed to IV cefepime, patient clinically improved   blood cultures from 11/25/2017 with Staphylococcus coagulase-negative and Enterobacter Repeat blood cultures from 11/27/2017 are with no growth Urine culture result is contaminated Will discharge patient with by mouth cipro 10 days Off pressors Flu test is negative  2.End-stage renal disease - nephrology is following Patient had hemodialysis yesterday. Repeat hemodialysis outpatient tomorrow on 11/29/2017  3.History of hypertension currently blood pressure is in the normal range hold antihypertensive Resume metoprolol  4.Diabetes type 2- hold Amaryl will place him on sliding scale insulin lantus 12 units  5.BPH continue Flomax  6.Miscellaneous heparin for DVT prophylaxis   Physical therapy is recommending home health PT  DISCHARGE CONDITIONS:   stable  CONSULTS OBTAINED:  Treatment Team:  Anthonette Legato, MD   PROCEDURES  NONE   DRUG ALLERGIES:  No Known Allergies  DISCHARGE MEDICATIONS:   Allergies as of 11/28/2017   No Known Allergies     Medication List    STOP taking these medications   cephALEXin 500 MG capsule Commonly known as:  KEFLEX   hydrALAZINE 50 MG tablet Commonly known as:  APRESOLINE   Vancomycin 750-5 MG/150ML-% Soln Commonly known as:  VANCOCIN     TAKE these medications   acetaminophen 325 MG tablet Commonly known as:  TYLENOL Take 2 tablets (650 mg total) by mouth every 6 (six) hours as needed for mild pain (or Fever >/= 101).   allopurinol 100 MG tablet Commonly known as:  ZYLOPRIM Take 100 mg by mouth daily.   aspirin 81 MG tablet Take 81 mg by mouth daily.  atorvastatin 20 MG tablet Commonly known as:  LIPITOR Take 20 mg by mouth daily.   ciprofloxacin 500 MG tablet Commonly known as:  CIPRO Take 1 tablet (500 mg total) by mouth at bedtime for 10 days.   feeding supplement (GLUCERNA SHAKE) Liqd Take 237 mLs by mouth 3 (three) times daily between meals.   ferrous sulfate 325 (65 FE) MG tablet Take  325 mg by mouth 2 (two) times daily with a meal.   glimepiride 2 MG tablet Commonly known as:  AMARYL Take 2 mg by mouth 2 (two) times daily.   HYDROcodone-acetaminophen 5-325 MG tablet Commonly known as:  NORCO Take 1 tablet by mouth every 6 (six) hours as needed for moderate pain or severe pain.   insulin glargine 100 UNIT/ML injection Commonly known as:  LANTUS Inject 0.12 mLs (12 Units total) into the skin daily. Start taking on:  11/29/2017   insulin lispro 100 UNIT/ML injection Commonly known as:  HUMALOG Inject 0.03 mLs (3 Units total) into the skin 3 (three) times daily with meals.   lidocaine-prilocaine cream Commonly known as:  EMLA Apply 1 application topically daily as needed.   Melatonin 5 MG Tabs Take 1 tablet (5 mg total) by mouth at bedtime.   metoprolol succinate 25 MG 24 hr tablet Commonly known as:  TOPROL-XL Take 25 mg by mouth daily.   multivitamin Tabs tablet Take 1 tablet by mouth at bedtime.   ondansetron 4 MG tablet Commonly known as:  ZOFRAN Take 1 tablet (4 mg total) by mouth every 6 (six) hours as needed for nausea.   pantoprazole 40 MG tablet Commonly known as:  PROTONIX Take 1 tablet (40 mg total) by mouth daily.   predniSONE 10 MG (21) Tbpk tablet Commonly known as:  STERAPRED UNI-PAK 21 TAB Take 1 tablet (10 mg total) by mouth daily. Take 6 tablets by mouth for 1 day followed by  5 tablets by mouth for 1 day followed by  4 tablets by mouth for 1 day followed by  3 tablets by mouth for 1 day followed by  2 tablets by mouth for 1 day followed by  1 tablet by mouth for a day and stop   REFRESH OP Place 1 drop into both eyes 2 (two) times daily.   sodium bicarbonate 650 MG tablet Take 650 mg by mouth 2 (two) times daily.   tamsulosin 0.4 MG Caps capsule Commonly known as:  FLOMAX Take 1 capsule (0.4 mg total) by mouth daily after supper.   Vitamin D (Cholecalciferol) 1000 units Tabs Take 1 tablet by mouth daily.         DISCHARGE INSTRUCTIONS:   Follow-up with primary care physician in a week Follow-up with nephrology in 2-3 days,  reassess blood pressure Continue hemodialysis, start from tomorrow as an outpatient  DIET:  Cardiac diet and Diabetic diet  DISCHARGE CONDITION:  Stable  ACTIVITY:  Activity as tolerated  OXYGEN:  Home Oxygen: Yes.     Oxygen Delivery: 2 liters/min via Patient connected to nasal cannula oxygen  DISCHARGE LOCATION:  home   If you experience worsening of your admission symptoms, develop shortness of breath, life threatening emergency, suicidal or homicidal thoughts you must seek medical attention immediately by calling 911 or calling your MD immediately  if symptoms less severe.  You Must read complete instructions/literature along with all the possible adverse reactions/side effects for all the Medicines you take and that have been prescribed to you. Take any new Medicines after you  have completely understood and accpet all the possible adverse reactions/side effects.   Please note  You were cared for by a hospitalist during your hospital stay. If you have any questions about your discharge medications or the care you received while you were in the hospital after you are discharged, you can call the unit and asked to speak with the hospitalist on call if the hospitalist that took care of you is not available. Once you are discharged, your primary care physician will handle any further medical issues. Please note that NO REFILLS for any discharge medications will be authorized once you are discharged, as it is imperative that you return to your primary care physician (or establish a relationship with a primary care physician if you do not have one) for your aftercare needs so that they can reassess your need for medications and monitor your lab values.     Today  Chief Complaint  Patient presents with  . Emesis   Patient is doing fine. Denies any complaints.  Denies any dizziness. According to physical therapy patient needs home health. Wife at bedside.  ROS:  CONSTITUTIONAL: Denies fevers, chills. Denies any fatigue, weakness.  EYES: Denies blurry vision, double vision, eye pain. EARS, NOSE, THROAT: Denies tinnitus, ear pain, hearing loss. RESPIRATORY: Denies cough, wheeze, shortness of breath.  CARDIOVASCULAR: Denies chest pain, palpitations, edema.  GASTROINTESTINAL: Denies nausea, vomiting, diarrhea, abdominal pain. Denies bright red blood per rectum. GENITOURINARY: Denies dysuria, hematuria. ENDOCRINE: Denies nocturia or thyroid problems. HEMATOLOGIC AND LYMPHATIC: Denies easy bruising or bleeding. SKIN: Denies rash or lesion. MUSCULOSKELETAL: Denies pain in neck, back, shoulder, knees, hips or arthritic symptoms.  NEUROLOGIC: Denies paralysis, paresthesias.  PSYCHIATRIC: Denies anxiety or depressive symptoms.   VITAL SIGNS:  Blood pressure (!) 162/60, pulse (!) 55, temperature 98.4 F (36.9 C), temperature source Oral, resp. rate 18, height 5\' 8"  (1.727 m), weight 76.6 kg (168 lb 14 oz), SpO2 95 %.  I/O:    Intake/Output Summary (Last 24 hours) at 11/28/2017 1535 Last data filed at 11/28/2017 1413 Gross per 24 hour  Intake 360 ml  Output 220 ml  Net 140 ml    PHYSICAL EXAMINATION:  GENERAL:  76 y.o.-year-old patient lying in the bed with no acute distress.  EYES: Pupils equal, round, reactive to light and accommodation. No scleral icterus. Extraocular muscles intact.  HEENT: Head atraumatic, normocephalic. Oropharynx and nasopharynx clear.  NECK:  Supple, no jugular venous distention. No thyroid enlargement, no tenderness.  LUNGS: Normal breath sounds bilaterally, no wheezing, rales,rhonchi or crepitation. No use of accessory muscles of respiration.  CARDIOVASCULAR: S1, S2 normal. No murmurs, rubs, or gallops.  ABDOMEN: Soft, non-tender, non-distended. Bowel sounds present. No organomegaly or mass.  EXTREMITIES: No pedal  edema, cyanosis, or clubbing.  NEUROLOGIC: Cranial nerves II through XII are intact. Muscle strength 5/5 in all extremities. Sensation intact. Gait not checked.  PSYCHIATRIC: The patient is alert and oriented x 3.  SKIN: No obvious rash, lesion, or ulcer.   DATA REVIEW:   CBC Recent Labs  Lab 11/28/17 0329  WBC 9.9  HGB 10.0*  HCT 30.3*  PLT 105*    Chemistries  Recent Labs  Lab 11/28/17 0329  NA 136  K 4.0  CL 100*  CO2 24  GLUCOSE 276*  BUN 54*  CREATININE 5.60*  CALCIUM 8.7*  AST 68*  ALT 38  ALKPHOS 311*  BILITOT 0.7    Cardiac Enzymes Recent Labs  Lab 11/25/17 1716  TROPONINI 0.13*  Microbiology Results  Results for orders placed or performed during the hospital encounter of 11/25/17  Blood Culture (routine x 2)     Status: None (Preliminary result)   Collection Time: 11/25/17  5:16 PM  Result Value Ref Range Status   Specimen Description BLOOD BLOOD RIGHT ARM  Final   Special Requests   Final    BOTTLES DRAWN AEROBIC AND ANAEROBIC Blood Culture adequate volume   Culture NO GROWTH 3 DAYS  Final   Report Status PENDING  Incomplete  Blood Culture (routine x 2)     Status: Abnormal (Preliminary result)   Collection Time: 11/25/17  5:16 PM  Result Value Ref Range Status   Specimen Description BLOOD BLOOD RIGHT FOREARM  Final   Special Requests   Final    BOTTLES DRAWN AEROBIC AND ANAEROBIC Blood Culture adequate volume   Culture  Setup Time   Final    GRAM POSITIVE COCCI ANAEROBIC BOTTLE ONLY CRITICAL RESULT CALLED TO, READ BACK BY AND VERIFIED WITH: MAT [place] ON 11/26/17 AT 2354 JAG    Culture (A)  Final    STAPHYLOCOCCUS SPECIES (COAGULASE NEGATIVE) THE SIGNIFICANCE OF ISOLATING THIS ORGANISM FROM A SINGLE SET OF BLOOD CULTURES WHEN MULTIPLE SETS ARE DRAWN IS UNCERTAIN. PLEASE NOTIFY THE MICROBIOLOGY DEPARTMENT WITHIN ONE WEEK IF SPECIATION AND SENSITIVITIES ARE REQUIRED. Performed at [facility], [place] [address], [place], [place]  [address]    Report Status PENDING  Incomplete  Blood Culture ID Panel (Reflexed)     Status: Abnormal   Collection Time: 11/25/17  5:16 PM  Result Value Ref Range Status   Enterococcus species NOT DETECTED NOT DETECTED Final   Listeria monocytogenes NOT DETECTED NOT DETECTED Final   Staphylococcus species DETECTED (A) NOT DETECTED Final    Comment: Methicillin (oxacillin) susceptible coagulase negative staphylococcus. Possible blood culture contaminant (unless isolated from more than one blood culture draw or clinical case suggests pathogenicity). No antibiotic treatment is indicated for blood  culture contaminants. CRITICAL RESULT CALLED TO, READ BACK BY AND VERIFIED WITH: MAT MCBANE ON 11/26/17 AT 2354 JAG    Staphylococcus aureus NOT DETECTED NOT DETECTED Final   Methicillin resistance NOT DETECTED NOT DETECTED Final   Streptococcus species NOT DETECTED NOT DETECTED Final   Streptococcus agalactiae NOT DETECTED NOT DETECTED Final   Streptococcus pneumoniae NOT DETECTED NOT DETECTED Final   Streptococcus pyogenes NOT DETECTED NOT DETECTED Final   Acinetobacter baumannii NOT DETECTED NOT DETECTED Final   Enterobacteriaceae species NOT DETECTED NOT DETECTED Final   Enterobacter cloacae complex NOT DETECTED NOT DETECTED Final   Escherichia coli NOT DETECTED NOT DETECTED Final   Klebsiella oxytoca NOT DETECTED NOT DETECTED Final   Klebsiella pneumoniae NOT DETECTED NOT DETECTED Final   Proteus species NOT DETECTED NOT DETECTED Final   Serratia marcescens NOT DETECTED NOT DETECTED Final   Haemophilus influenzae NOT DETECTED NOT DETECTED Final   Neisseria meningitidis NOT DETECTED NOT DETECTED Final   Pseudomonas aeruginosa NOT DETECTED NOT DETECTED Final   Candida albicans NOT DETECTED NOT DETECTED Final   Candida glabrata NOT DETECTED NOT DETECTED Final   Candida krusei NOT DETECTED NOT DETECTED Final   Candida parapsilosis NOT DETECTED NOT DETECTED Final   Candida tropicalis NOT  DETECTED NOT DETECTED Final  MRSA PCR Screening     Status: None   Collection Time: 11/25/17 10:41 PM  Result Value Ref Range Status   MRSA by PCR NEGATIVE NEGATIVE Final    Comment:  The GeneXpert MRSA Assay (FDA approved for NASAL specimens only), is one component of a comprehensive MRSA colonization surveillance program. It is not intended to diagnose MRSA infection nor to guide or monitor treatment for MRSA infections.   Urine Culture     Status: Abnormal   Collection Time: 11/26/17  6:00 PM  Result Value Ref Range Status   Specimen Description URINE, RANDOM  Final   Special Requests   Final    NONE Performed at [facility], [address] [place], [place] [address]    Culture MULTIPLE SPECIES PRESENT, SUGGEST RECOLLECTION (A)  Final   Report Status 11/28/2017 FINAL  Final  CULTURE, BLOOD (ROUTINE X 2) w Reflex to ID Panel     Status: None (Preliminary result)   Collection Time: 11/27/17  7:29 AM  Result Value Ref Range Status   Specimen Description BLOOD Blood Culture adequate volume  Final   Special Requests   Final    BOTTLES DRAWN AEROBIC AND ANAEROBIC BLOOD RIGHT WRIST   Culture NO GROWTH < 24 HOURS  Final   Report Status PENDING  Incomplete  CULTURE, BLOOD (ROUTINE X 2) w Reflex to ID Panel     Status: None (Preliminary result)   Collection Time: 11/27/17  9:30 AM  Result Value Ref Range Status   Specimen Description BLOOD BLOOD LEFT HAND  Final   Special Requests   Final    BOTTLES DRAWN AEROBIC AND ANAEROBIC Blood Culture adequate volume   Culture NO GROWTH < 24 HOURS  Final   Report Status PENDING  Incomplete    RADIOLOGY:  Dg Chest 2 View  Result Date: 11/25/2017 CLINICAL DATA:  Fever and generalized weakness.  Fall this morning. EXAM: CHEST  2 VIEW COMPARISON:  08/07/2017 FINDINGS: A left-sided dialysis catheter remains in place with tip near the cavoatrial junction/high right atrium. The cardiac silhouette is upper limits of normal in  size. Lung volumes remain diminished with mild linear opacity in the left lung base, less than on the prior study and most suggestive of atelectasis. No overt edema, pleural effusion, or pneumothorax is identified. Thoracic spondylosis is noted. IMPRESSION: Low lung volumes with mild left basilar atelectasis. Electronically Signed   By: Logan Bores M.D.   On: 11/25/2017 08:08   Dg Chest Port 1 View  Result Date: 11/25/2017 CLINICAL DATA:  76 year old male on in stage dialysis. Fever. Subsequent encounter. EXAM: PORTABLE CHEST 1 VIEW COMPARISON:  11/25/2017. FINDINGS: Left central line tip right atrium level.  Cardiomegaly. Left base subsegmental atelectasis. No infiltrate, congestive heart failure or pneumothorax. No acute osseous abnormality. IMPRESSION: Left base subsegmental atelectasis. Cardiomegaly. Electronically Signed   By: Genia Del M.D.   On: 11/25/2017 17:34    EKG:   Orders placed or performed during the hospital encounter of 11/25/17  . EKG 12-Lead  . EKG 12-Lead  . EKG 12-Lead  . EKG 12-Lead  . EKG 12-Lead  . EKG 12-Lead      Management plans discussed with the patient, family and they are in agreement. Plan of care discussed with the help of Spanish interpreter Mar[place]  CODE STATUS:     Code Status Orders  (From admission, onward)        Start     Ordered   11/25/17 1938  Full code  Continuous     11/25/17 1938    Code Status History    Date Active Date Inactive Code Status Order ID Comments User Context   09/05/2017 15:08  09/05/2017 19:55 Full Code [id number]  Katha Cabal, MD Inpatient   08/02/2017 18:34 08/06/2017 19:59 Full Code [id number]  Loletha Grayer, MD ED   03/27/2017 05:42 04/04/2017 21:41 Full Code [id number]  Saundra Shelling, MD Inpatient   02/15/2017 09:11 02/19/2017 18:04 Full Code [id number]  Saundra Shelling, MD Inpatient   01/31/2017 02:36 02/02/2017 20:22 Full Code [id number]  Saundra Shelling, MD ED      TOTAL TIME TAKING CARE OF THIS PATIENT:  45  minutes.   Note: This dictation was prepared with Dragon dictation along with smaller phrase technology. Any transcriptional errors that result from this process are unintentional.   @[facility] @  on 11/28/2017 at 3:35 PM  Between 7am to 6pm - Pager - [phone]  After 6pm go to [email or web address] - password [email or web address] [facility]  Office  [phone]  CC: Primary care physician; [name], MD

## 2017-11-28 NOTE — Evaluation (Signed)
Physical Therapy Evaluation Patient Details Name: Martin Gilbert MRN: [id number] DOB: [birth year] Today's Date: 11/28/2017   History of Present Illness  Pt is a 76 y/o M with a known history of end-stage renal disease on hemodialysis Tuesday Thursday and Saturday, essential hypertension, diabetes who was seen in the emergency room for evaluation of fever.  Pt had fall earlier that day.  Pt was noted to have UTI at recent visit to ED. Pt admitted with sepsis likely due to UTI. Pt's PMH includes back pain.    Clinical Impression  Pt admitted with above diagnosis. Pt currently with functional limitations due to the deficits listed below (see PT Problem List). Phone Spanish interpreter utilized; however, limited evaluation due to language barrier as pt speaks specific dialect.  Pt currently requires min guard assist when using RW and will benefit from using RW at d/c.  Pt lives at home with his wife and does not have any steps to enter home.  Pt reports he was independent PTA, not ambulating with AD. Pt will benefit from skilled PT to increase their independence and safety with mobility to allow discharge to the venue listed below.      Follow Up Recommendations Home health PT;Supervision for mobility/OOB    Equipment Recommendations  Rolling walker with 5" wheels    Recommendations for Other Services       Precautions / Restrictions Precautions Precautions: Fall Precaution Comments: Will need in person Spanish interpreter Restrictions Weight Bearing Restrictions: No      Mobility  Bed Mobility Overal bed mobility: Independent             General bed mobility comments: Pt performs independently.  No physical assist or cues needed.  Transfers Overall transfer level: Needs assistance Equipment used: None;Rolling walker (2 wheeled) Transfers: Sit to/from Stand Sit to Stand: Min guard         General transfer comment: Pt stands from bed initially without AD but reaching  out for counterop in front of him for support.  When pt performs stand>sit he has RW with him and remains steady, min guard provided for safety.   Ambulation/Gait Ambulation/Gait assistance: Min guard;Min assist Ambulation Distance (Feet): 80 Feet(15. 65) Assistive device: Rolling walker (2 wheeled);None Gait Pattern/deviations: Step-through pattern;Decreased stride length;Trunk flexed;Wide base of support Gait velocity: decreased Gait velocity interpretation: Below normal speed for age/gender General Gait Details: Pt ambulates from bed to commode without AD, furniture surfing and requiring light min assist at times due to instability.  RW introduced and pt ambulated 65 ft with close min guard for safety with mild instability but no LOB.    Stairs            Wheelchair Mobility    Modified Rankin (Stroke Patients Only)       Balance Overall balance assessment: Needs assistance Sitting-balance support: No upper extremity supported;Feet supported Sitting balance-Leahy Scale: Good     Standing balance support: No upper extremity supported;During functional activity Standing balance-Leahy Scale: Fair Standing balance comment: Pt able to stand statically without UE support but relies on UE support for dynamic activities                             Pertinent Vitals/Pain Pain Assessment: No/denies pain    Home Living Family/patient expects to be discharged to:: Private residence Living Arrangements: Spouse/significant other Available Help at Discharge: Family Type of Home: Mobile home Home Access: Ramped entrance  Home Layout: One level Home Equipment: Cane - single point;Shower seat Additional Comments: Information gathered from combination of using phone interpreter and information from RN.      Prior Function Level of Independence: Independent         Comments: Pt reports he ambulates without AD at home and does not answer question about how many  falls he has had in the past 6 months.       Hand Dominance        Extremity/Trunk Assessment   Upper Extremity Assessment Upper Extremity Assessment: (BUE strength grossly at least 3/5)    Lower Extremity Assessment Lower Extremity Assessment: (BLE strength grossly at least 3/5)    Cervical / Trunk Assessment Cervical / Trunk Assessment: Kyphotic  Communication   Communication: Interpreter utilized(phone interpreter (in person not available))  Cognition Arousal/Alertness: Awake/alert Behavior During Therapy: WFL for tasks assessed/performed Overall Cognitive Status: Difficult to assess                                 General Comments: When asked orientation questions the pt reports the date when asked which hospital he is in.  Suspect this may due to language barrier as per RN the pt is oriented x4.       General Comments General comments (skin integrity, edema, etc.): Per chart review and per patient the patient is Spanish speaking and phone interpreter utilized: Caren Griffins from [employer] [phone].  Pt and phone interpreter able to communicate some but with difficulties understanding each other.  Pt confirmed with interpreter that he is Spanish speaking.  Following treatment session the MD informs this PT that the pt is Spanish speaking but speaks a specific dialect and therefore has a difficult time understanding the phone interpreter and does well with an in person Spanish interpreter.     Exercises     Assessment/Plan    PT Assessment Patient needs continued PT services  PT Problem List Decreased strength;Decreased balance;Decreased activity tolerance;Decreased knowledge of use of DME;Decreased safety awareness       PT Treatment Interventions DME instruction;Gait training;Functional mobility training;Therapeutic activities;Therapeutic exercise;Balance training;Neuromuscular re-education;Patient/family education    PT Goals (Current goals can be  found in the Care Plan section)  Acute Rehab PT Goals Patient Stated Goal: Unable to state clearly with interpreter PT Goal Formulation: With patient(pt agreeable to work with PT and understands role of PT) Time For Goal Achievement: 12/12/17 Potential to Achieve Goals: Good    Frequency Min 2X/week   Barriers to discharge        Co-evaluation               AM-PAC PT "6 Clicks" Daily Activity  Outcome Measure Difficulty turning over in bed (including adjusting bedclothes, sheets and blankets)?: None Difficulty moving from lying on back to sitting on the side of the bed? : None Difficulty sitting down on and standing up from a chair with arms (e.g., wheelchair, bedside commode, etc,.)?: A Little Help needed moving to and from a bed to chair (including a wheelchair)?: A Little Help needed walking in hospital room?: A Little Help needed climbing 3-5 steps with a railing? : A Little 6 Click Score: 20    End of Session Equipment Utilized During Treatment: Gait belt Activity Tolerance: Patient tolerated treatment well Patient left: in bed;with call bell/phone within reach;with bed alarm set;with family/visitor present Nurse Communication: Mobility status;Other (comment)(Difficulty with formal assessment due to  language barrier) PT Visit Diagnosis: Unsteadiness on feet (R26.81);Other abnormalities of gait and mobility (R26.89);Muscle weakness (generalized) (M62.81)    Time: 3825-0539 PT Time Calculation (min) (ACUTE ONLY): 29 min   Charges:   PT Evaluation $PT Eval Low Complexity: 1 Low PT Treatments $Therapeutic Exercise: 8-22 mins   PT G Codes:   PT G-Codes **NOT FOR INPATIENT CLASS** Functional Assessment Tool Used: AM-PAC 6 Clicks Basic Mobility;Clinical judgement Functional Limitation: Mobility: Walking and moving around Mobility: Walking and Moving Around Current Status (J6734): At least 20 percent but less than 40 percent impaired, limited or restricted Mobility:  Walking and Moving Around Goal Status [phone]): At least 1 percent but less than 20 percent impaired, limited or restricted    Collie Siad PT, DPT 11/28/2017, 2:25 PM

## 2017-11-28 NOTE — Progress Notes (Signed)
[facility]  ROUNDING NOTE   Subjective:  Interview conducted today through interpreter. Patient did undergo hemodialysis yesterday. Overall feeling better.   Objective:  Vital signs in last 24 hours:  Temp:  [98.3 F (36.8 C)-98.4 F (36.9 C)] 98.4 F (36.9 C) (12/14 0623) Pulse Rate:  [48-63] 55 (12/14 0623) Resp:  [15-23] 18 (12/13 2023) BP: (107-162)/(46-104) 162/60 (12/14 0623) SpO2:  [94 %-98 %] 95 % (12/14 0623) Weight:  [76.6 kg (168 lb 14 oz)] 76.6 kg (168 lb 14 oz) (12/13 1345)  Weight change:  Filed Weights   11/25/17 1715 11/27/17 1345  Weight: 76.7 kg (169 lb) 76.6 kg (168 lb 14 oz)    Intake/Output: I/O last 3 completed shifts: In: 480 [P.O.:480] Out: 300 [Urine:300]   Intake/Output this shift:  No intake/output data recorded.  Physical Exam: General: No acute distress  Head: Normocephalic, atraumatic. Moist oral mucosal membranes  Eyes: Anicteric  Neck: Supple, trachea midline  Lungs:  Clear to auscultation, normal effort  Heart: S1S2 no rubs  Abdomen:  Soft, nontender, bowel sounds present  Extremities: Trace peripheral edema.  Neurologic: Awake, alert, following commands  Skin: No lesions  Access: L IJ PC, maturing LUE AVF    Basic Metabolic Panel: Recent Labs  Lab 11/25/17 0715 11/25/17 1716 11/26/17 0539 11/27/17 0729 11/28/17 0329  NA 134* 136 136 134* 136  K 4.3 3.5 4.3 4.9 4.0  CL 98* 95* 104 104 100*  CO2 20* 24 20* 17* 24  GLUCOSE 145* 143* 124* 214* 276*  BUN 95* 62* 69* 100* 54*  CREATININE 11.02* 8.10* 8.87* 9.71* 5.60*  CALCIUM 8.9 9.0 8.0* 8.3* 8.7*    Liver Function Tests: Recent Labs  Lab 11/25/17 1716 11/27/17 0729 11/28/17 0329  AST 88* 81* 68*  ALT 51 41 38  ALKPHOS 487* 288* 311*  BILITOT 2.0* 1.2 0.7  PROT 8.4* 6.6 6.7  ALBUMIN 3.5 2.6* 2.5*   Recent Labs  Lab 11/25/17 1716  LIPASE 32   No results for input(s): AMMONIA in the last 168 hours.  CBC: Recent Labs  Lab 11/25/17 0715  11/25/17 1716 11/26/17 0539 11/27/17 0729 11/28/17 0329  WBC 7.9 7.5 21.7* 12.3* 9.9  NEUTROABS 5.7 6.9*  --   --   --   HGB 11.0* 12.9* 10.2* 10.1* 10.0*  HCT 32.8* 38.1* 30.9* 30.0* 30.3*  MCV 93.9 93.7 94.7 94.9 94.8  PLT 158 133* 113* 94* 105*    Cardiac Enzymes: Recent Labs  Lab 11/25/17 1716  TROPONINI 0.13*    BNP: Invalid input(s): POCBNP  CBG: Recent Labs  Lab 11/27/17 1131 11/27/17 1858 11/27/17 2216 11/28/17 0721 11/28/17 1133  GLUCAP 225* 236* 311* 250* 72*    Microbiology: Results for orders placed or performed during the hospital encounter of 11/25/17  Blood Culture (routine x 2)     Status: None (Preliminary result)   Collection Time: 11/25/17  5:16 PM  Result Value Ref Range Status   Specimen Description BLOOD BLOOD RIGHT ARM  Final   Special Requests   Final    BOTTLES DRAWN AEROBIC AND ANAEROBIC Blood Culture adequate volume   Culture NO GROWTH 3 DAYS  Final   Report Status PENDING  Incomplete  Blood Culture (routine x 2)     Status: Abnormal (Preliminary result)   Collection Time: 11/25/17  5:16 PM  Result Value Ref Range Status   Specimen Description BLOOD BLOOD RIGHT FOREARM  Final   Special Requests   Final    BOTTLES  DRAWN AEROBIC AND ANAEROBIC Blood Culture adequate volume   Culture  Setup Time   Final    GRAM POSITIVE COCCI ANAEROBIC BOTTLE ONLY CRITICAL RESULT CALLED TO, READ BACK BY AND VERIFIED WITH: MAT MCBANE ON 11/26/17 AT 2354 JAG    Culture (A)  Final    STAPHYLOCOCCUS SPECIES (COAGULASE NEGATIVE) THE SIGNIFICANCE OF ISOLATING THIS ORGANISM FROM A SINGLE SET OF BLOOD CULTURES WHEN MULTIPLE SETS ARE DRAWN IS UNCERTAIN. PLEASE NOTIFY THE MICROBIOLOGY DEPARTMENT WITHIN ONE WEEK IF SPECIATION AND SENSITIVITIES ARE REQUIRED. Performed at [facility], [place] [address], [place], [place] [address]    Report Status PENDING  Incomplete  Blood Culture ID Panel (Reflexed)     Status: Abnormal   Collection Time: 11/25/17  5:16 PM   Result Value Ref Range Status   Enterococcus species NOT DETECTED NOT DETECTED Final   Listeria monocytogenes NOT DETECTED NOT DETECTED Final   Staphylococcus species DETECTED (A) NOT DETECTED Final    Comment: Methicillin (oxacillin) susceptible coagulase negative staphylococcus. Possible blood culture contaminant (unless isolated from more than one blood culture draw or clinical case suggests pathogenicity). No antibiotic treatment is indicated for blood  culture contaminants. CRITICAL RESULT CALLED TO, READ BACK BY AND VERIFIED WITH: MAT MCBANE ON 11/26/17 AT 2354 JAG    Staphylococcus aureus NOT DETECTED NOT DETECTED Final   Methicillin resistance NOT DETECTED NOT DETECTED Final   Streptococcus species NOT DETECTED NOT DETECTED Final   Streptococcus agalactiae NOT DETECTED NOT DETECTED Final   Streptococcus pneumoniae NOT DETECTED NOT DETECTED Final   Streptococcus pyogenes NOT DETECTED NOT DETECTED Final   Acinetobacter baumannii NOT DETECTED NOT DETECTED Final   Enterobacteriaceae species NOT DETECTED NOT DETECTED Final   Enterobacter cloacae complex NOT DETECTED NOT DETECTED Final   Escherichia coli NOT DETECTED NOT DETECTED Final   Klebsiella oxytoca NOT DETECTED NOT DETECTED Final   Klebsiella pneumoniae NOT DETECTED NOT DETECTED Final   Proteus species NOT DETECTED NOT DETECTED Final   Serratia marcescens NOT DETECTED NOT DETECTED Final   Haemophilus influenzae NOT DETECTED NOT DETECTED Final   Neisseria meningitidis NOT DETECTED NOT DETECTED Final   Pseudomonas aeruginosa NOT DETECTED NOT DETECTED Final   Candida albicans NOT DETECTED NOT DETECTED Final   Candida glabrata NOT DETECTED NOT DETECTED Final   Candida krusei NOT DETECTED NOT DETECTED Final   Candida parapsilosis NOT DETECTED NOT DETECTED Final   Candida tropicalis NOT DETECTED NOT DETECTED Final  MRSA PCR Screening     Status: None   Collection Time: 11/25/17 10:41 PM  Result Value Ref Range Status   MRSA by  PCR NEGATIVE NEGATIVE Final    Comment:        The GeneXpert MRSA Assay (FDA approved for NASAL specimens only), is one component of a comprehensive MRSA colonization surveillance program. It is not intended to diagnose MRSA infection nor to guide or monitor treatment for MRSA infections.   Urine Culture     Status: Abnormal   Collection Time: 11/26/17  6:00 PM  Result Value Ref Range Status   Specimen Description URINE, RANDOM  Final   Special Requests   Final    NONE Performed at [facility], [address] [place], [place] [address]    Culture MULTIPLE SPECIES PRESENT, SUGGEST RECOLLECTION (A)  Final   Report Status 11/28/2017 FINAL  Final  CULTURE, BLOOD (ROUTINE X 2) w Reflex to ID Panel     Status: None (Preliminary result)   Collection Time:  11/27/17  7:29 AM  Result Value Ref Range Status   Specimen Description BLOOD Blood Culture adequate volume  Final   Special Requests   Final    BOTTLES DRAWN AEROBIC AND ANAEROBIC BLOOD RIGHT WRIST   Culture NO GROWTH < 24 HOURS  Final   Report Status PENDING  Incomplete  CULTURE, BLOOD (ROUTINE X 2) w Reflex to ID Panel     Status: None (Preliminary result)   Collection Time: 11/27/17  9:30 AM  Result Value Ref Range Status   Specimen Description BLOOD BLOOD LEFT HAND  Final   Special Requests   Final    BOTTLES DRAWN AEROBIC AND ANAEROBIC Blood Culture adequate volume   Culture NO GROWTH < 24 HOURS  Final   Report Status PENDING  Incomplete    Coagulation Studies: Recent Labs    11/25/17 1716  LABPROT 13.6  INR 1.05    Urinalysis: No results for input(s): COLORURINE, LABSPEC, PHURINE, GLUCOSEU, HGBUR, BILIRUBINUR, KETONESUR, PROTEINUR, UROBILINOGEN, NITRITE, LEUKOCYTESUR in the last 72 hours.  Invalid input(s): APPERANCEUR    Imaging: No results found.   Medications:   . sodium chloride    . ceFEPime (MAXIPIME) IV 1 g (11/26/17 1750)   . allopurinol  100 mg Oral Daily  . aspirin EC  81 mg Oral  Daily  . atorvastatin  20 mg Oral Daily  . cholecalciferol  1,000 Units Oral Daily  . epoetin (EPOGEN/PROCRIT) injection  4,000 Units Intravenous Q T,Th,Sa-HD  . feeding supplement (GLUCERNA SHAKE)  237 mL Oral TID BM  . ferrous sulfate  325 mg Oral BID WC  . heparin injection (subcutaneous)  5,000 Units Subcutaneous Q8H  . hydrocortisone sod succinate (SOLU-CORTEF) inj  50 mg Intravenous Q6H  . insulin aspart  0-15 Units Subcutaneous TID WC  . insulin aspart  0-5 Units Subcutaneous QHS  . insulin aspart  3 Units Subcutaneous TID WC  . insulin glargine  12 Units Subcutaneous Daily  . Melatonin  5 mg Oral QHS  . multivitamin  1 tablet Oral QHS  . pantoprazole  40 mg Oral Daily  . polyvinyl alcohol  2 drop Both Eyes BID  . sodium bicarbonate  650 mg Oral BID  . sodium chloride flush  3 mL Intravenous Q12H  . tamsulosin  0.4 mg Oral QPC supper   sodium chloride, acetaminophen **OR** acetaminophen, ondansetron **OR** ondansetron (ZOFRAN) IV, sodium chloride flush  Assessment/ Plan:  76 y.o.  Hispanic male with end stage renal disease on hemodialysis, diabetes mellitus type II, hypertension, hepatic cirrhosis, BPH, gout, diabetic peripheral neuropathy  TTS [facility] Left IJ permcath  1. End Stage Renal Disease:   -Patient did undergo hemodialysis yesterday.  No acute indication for dialysis today.  He will undergo dialysis tomorrow as outpatient.  2.  Hypotension.  Appears to have resolved at the moment.  Reassess blood pressure tomorrow as outpatient.  3.  Secondary Hyperparathyroidism:   -Continue to monitor phosphorus and calcium as an outpatient.  4. Anemia of chronic kidney disease: Patient will be maintained on Epogen as an outpatient.    LOS: 3 Katonya Blecher 12/14/201812:49 PM

## 2017-11-28 NOTE — Progress Notes (Signed)
Martin Gilbert  A and O x 4. VSS. Pt tolerating diet well. No complaints of pain or nausea. IV removed intact, prescriptions given. Pt voiced understanding of discharge instructions with no further questions. Pt discharged via wheelchair with nurse tech.     Allergies as of 11/28/2017   No Known Allergies     Medication List    STOP taking these medications   cephALEXin 500 MG capsule Commonly known as:  KEFLEX   hydrALAZINE 50 MG tablet Commonly known as:  APRESOLINE   Vancomycin 750-5 MG/150ML-% Soln Commonly known as:  VANCOCIN     TAKE these medications   acetaminophen 325 MG tablet Commonly known as:  TYLENOL Take 2 tablets (650 mg total) by mouth every 6 (six) hours as needed for mild pain (or Fever >/= 101).   allopurinol 100 MG tablet Commonly known as:  ZYLOPRIM Take 100 mg by mouth daily.   aspirin 81 MG tablet Take 81 mg by mouth daily.   atorvastatin 20 MG tablet Commonly known as:  LIPITOR Take 20 mg by mouth daily.   ciprofloxacin 500 MG tablet Commonly known as:  CIPRO Take 1 tablet (500 mg total) by mouth at bedtime for 10 days.   feeding supplement (GLUCERNA SHAKE) Liqd Take 237 mLs by mouth 3 (three) times daily between meals.   ferrous sulfate 325 (65 FE) MG tablet Take 325 mg by mouth 2 (two) times daily with a meal.   glimepiride 2 MG tablet Commonly known as:  AMARYL Take 2 mg by mouth 2 (two) times daily.   HYDROcodone-acetaminophen 5-325 MG tablet Commonly known as:  NORCO Take 1 tablet by mouth every 6 (six) hours as needed for moderate pain or severe pain.   insulin glargine 100 UNIT/ML injection Commonly known as:  LANTUS Inject 0.12 mLs (12 Units total) into the skin daily. Start taking on:  11/29/2017   insulin lispro 100 UNIT/ML injection Commonly known as:  HUMALOG Inject 0.03 mLs (3 Units total) into the skin 3 (three) times daily with meals.   lidocaine-prilocaine cream Commonly known as:  EMLA Apply 1  application topically daily as needed.   Melatonin 5 MG Tabs Take 1 tablet (5 mg total) by mouth at bedtime.   metoprolol succinate 25 MG 24 hr tablet Commonly known as:  TOPROL-XL Take 25 mg by mouth daily.   multivitamin Tabs tablet Take 1 tablet by mouth at bedtime.   ondansetron 4 MG tablet Commonly known as:  ZOFRAN Take 1 tablet (4 mg total) by mouth every 6 (six) hours as needed for nausea.   pantoprazole 40 MG tablet Commonly known as:  PROTONIX Take 1 tablet (40 mg total) by mouth daily.   predniSONE 10 MG (21) Tbpk tablet Commonly known as:  STERAPRED UNI-PAK 21 TAB Take 1 tablet (10 mg total) by mouth daily. Take 6 tablets by mouth for 1 day followed by  5 tablets by mouth for 1 day followed by  4 tablets by mouth for 1 day followed by  3 tablets by mouth for 1 day followed by  2 tablets by mouth for 1 day followed by  1 tablet by mouth for a day and stop   REFRESH OP Place 1 drop into both eyes 2 (two) times daily.   sodium bicarbonate 650 MG tablet Take 650 mg by mouth 2 (two) times daily.   tamsulosin 0.4 MG Caps capsule Commonly known as:  FLOMAX Take 1 capsule (0.4 mg total) by mouth daily after supper.  Vitamin D (Cholecalciferol) 1000 units Tabs Take 1 tablet by mouth daily.       Vitals:   11/27/17 2023 11/28/17 0623  BP: (!) 126/54 (!) 162/60  Pulse: 62 (!) 55  Resp: 18   Temp: 98.3 F (36.8 C) 98.4 F (36.9 C)  SpO2: 95% 95%    Francesco Sor

## 2017-11-28 NOTE — Discharge Instructions (Signed)
Follow-up with primary care physician in a week Follow-up with nephrology in 2-3 days,  reassess blood pressure Continue hemodialysis, start from tomorrow as an outpatient

## 2017-11-28 NOTE — Care Management Important Message (Signed)
Important Message  Patient Details  Name: [name] MRN: [id number] Date of Birth: 09/21/1941   Medicare Important Message Given:  Yes    Beverly Sessions, RN 11/28/2017, 2:24 PM

## 2017-11-28 NOTE — Care Management (Signed)
Patient to discharge home today.  RNCM spoke with daughter [name].  Patient lives at home with wife.  Children live locally for support.  Patient utilizes [employer] for transportation or family provides transportation.  Niece to transport at discharge.  Per daughter pat has a RW and cane in the home.  PCP [facility].  Daughter confirms that patient is able to obtain all of his medication from [facility] as prescribed.  PT has assessed patient and recommends home health PT.  Patient in agreement to [facility].  Daughter provided with home health agency.  She states that she does not have a preference of agency.  Referral made to [facility].  [name] liaison notified of discharge.  RNCM signing off.

## 2017-11-28 NOTE — Progress Notes (Signed)
Inpatient Diabetes Program Recommendations  AACE/ADA: New Consensus Statement on Inpatient Glycemic Control (2015)  Target Ranges:  Prepandial:   less than 140 mg/dL      Peak postprandial:   less than 180 mg/dL (1-2 hours)      Critically ill patients:  140 - 180 mg/dL   Results for Martin Gilbert, Martin Gilbert (MRN [id number]) as of 11/28/2017 09:19  Ref. Range 11/27/2017 07:25 11/27/2017 11:31 11/27/2017 18:58 11/27/2017 22:16  Glucose-Capillary Latest Ref Range: 65 - 99 mg/dL 200 (H) 225 (H) 236 (H) 311 (H)   Results for Martin Gilbert, Martin Gilbert (MRN [id number]) as of 11/28/2017 09:19  Ref. Range 11/28/2017 07:21  Glucose-Capillary Latest Ref Range: 65 - 99 mg/dL 250 (H)    Home DM Meds: Amaryl 2 mg BID  Current Insulin Orders: Novolog Moderate Correction Scale/ SSI (0-15 units) TID AC + HS       MD- Note patient getting Solucortef 50 mg Q6 hours.  CBGs quite elevated due to steroids.  Please consider the following in-hospital insulin adjustments:  1. Start Lantus 12 units daily (0.15 units/kg dosing)  2. Start low dose Novolog Meal Coverage: Novolog 3 units TID with meals (hold if pt eats <50% of meal)       --Will follow patient during hospitalization--  Wyn Quaker RN, MSN, CDE Diabetes Coordinator Inpatient Glycemic Control Team Team Pager: [phone] (8a-5p)

## 2017-11-29 LAB — CULTURE, BLOOD (ROUTINE X 2): SPECIAL REQUESTS: ADEQUATE

## 2017-11-30 LAB — CULTURE, BLOOD (ROUTINE X 2)
CULTURE: NO GROWTH
SPECIAL REQUESTS: ADEQUATE

## 2017-12-02 LAB — CULTURE, BLOOD (ROUTINE X 2)
Culture: NO GROWTH
Culture: NO GROWTH
Special Requests: ADEQUATE
Specimen Description: ADEQUATE

## 2017-12-08 NOTE — Progress Notes (Signed)
[facility]  Patient Status:  12/01/17 SN called patient to schedule visit and spoke with daughter, who agreed to a visit today at 35, daughter called SN at 66 to say they would not be able to make the appt at 4 and actually the patient did not ask for [facility] and did not want it. SN called PCP to make them aware that patient would not be admitted to [facility] services, had to leave a message on voice mail. Alerted Isaias Cowman, [facility] as well.      Martin Gilbert 12/08/2017, 9:45 AM

## 2017-12-26 ENCOUNTER — Encounter (INDEPENDENT_AMBULATORY_CARE_PROVIDER_SITE_OTHER): Payer: Self-pay

## 2017-12-29 ENCOUNTER — Other Ambulatory Visit (INDEPENDENT_AMBULATORY_CARE_PROVIDER_SITE_OTHER): Payer: Self-pay | Admitting: Vascular Surgery

## 2017-12-31 ENCOUNTER — Ambulatory Visit
Admission: RE | Admit: 2017-12-31 | Discharge: 2017-12-31 | Disposition: A | Discharge status: Home / Self Care | Payer: Medicare Other | Source: Ambulatory Visit | Attending: Vascular Surgery | Admitting: Vascular Surgery

## 2017-12-31 ENCOUNTER — Encounter
Admission: RE | Disposition: A | Discharge status: Home / Self Care | Payer: Self-pay | Source: Ambulatory Visit | Attending: Vascular Surgery

## 2017-12-31 DIAGNOSIS — Z992 Dependence on renal dialysis: Secondary | ICD-10-CM | POA: Diagnosis not present

## 2017-12-31 DIAGNOSIS — I120 Hypertensive chronic kidney disease with stage 5 chronic kidney disease or end stage renal disease: Secondary | ICD-10-CM | POA: Insufficient documentation

## 2017-12-31 DIAGNOSIS — Z955 Presence of coronary angioplasty implant and graft: Secondary | ICD-10-CM | POA: Diagnosis not present

## 2017-12-31 DIAGNOSIS — Z8619 Personal history of other infectious and parasitic diseases: Secondary | ICD-10-CM | POA: Insufficient documentation

## 2017-12-31 DIAGNOSIS — E1122 Type 2 diabetes mellitus with diabetic chronic kidney disease: Secondary | ICD-10-CM | POA: Diagnosis not present

## 2017-12-31 DIAGNOSIS — Z452 Encounter for adjustment and management of vascular access device: Secondary | ICD-10-CM | POA: Diagnosis present

## 2017-12-31 DIAGNOSIS — Z9889 Other specified postprocedural states: Secondary | ICD-10-CM | POA: Diagnosis not present

## 2017-12-31 DIAGNOSIS — Z8249 Family history of ischemic heart disease and other diseases of the circulatory system: Secondary | ICD-10-CM | POA: Diagnosis not present

## 2017-12-31 DIAGNOSIS — Z9849 Cataract extraction status, unspecified eye: Secondary | ICD-10-CM | POA: Insufficient documentation

## 2017-12-31 DIAGNOSIS — E785 Hyperlipidemia, unspecified: Secondary | ICD-10-CM | POA: Insufficient documentation

## 2017-12-31 DIAGNOSIS — I10 Essential (primary) hypertension: Secondary | ICD-10-CM | POA: Diagnosis not present

## 2017-12-31 DIAGNOSIS — N400 Enlarged prostate without lower urinary tract symptoms: Secondary | ICD-10-CM | POA: Diagnosis not present

## 2017-12-31 DIAGNOSIS — N186 End stage renal disease: Secondary | ICD-10-CM | POA: Diagnosis not present

## 2017-12-31 DIAGNOSIS — E119 Type 2 diabetes mellitus without complications: Secondary | ICD-10-CM

## 2017-12-31 HISTORY — PX: DIALYSIS/PERMA CATHETER REMOVAL: CATH118289

## 2017-12-31 LAB — GLUCOSE, CAPILLARY: GLUCOSE-CAPILLARY: 117 mg/dL — AB (ref 65–99)

## 2017-12-31 SURGERY — DIALYSIS/PERMA CATHETER REMOVAL
Anesthesia: LOCAL

## 2017-12-31 MED ORDER — LIDOCAINE-EPINEPHRINE (PF) 1 %-1:200000 IJ SOLN
INTRAMUSCULAR | Status: DC | PRN
  Administered 2017-12-31: 20 mL

## 2017-12-31 SURGICAL SUPPLY — 2 items
FORCEPS HALSTEAD CVD 5IN STRL (INSTRUMENTS) ×2 IMPLANT
TRAY LACERAT/PLASTIC (MISCELLANEOUS) ×2 IMPLANT

## 2017-12-31 NOTE — Discharge Instructions (Signed)
Cuidado de las [employer] adultos (Wound Care, Adult) El cuidado correcto de las heridas puede ayudar a Information systems manager y a [place] infecciones. Adems puede ayudar a que la cicatrizacin sea ms rpida. CMO CUIDAR LA HERIDA Cuidado de las heridas  Siga las indicaciones del mdico acerca del cuidado de la herida. Haga lo siguiente: ? Lvese las manos con agua y jabn antes de cambiar la venda (vendaje). Use desinfectante para manos si no dispone de [place]. ? Cambie el vendaje como se lo haya indicado el mdico. ? No retire los puntos (suturas), el [employer] para la piel o las tiras [place]. Es posible que estos deban quedar puestos en la piel durante 2semanas o ms tiempo. Si los bordes de las tiras adhesivas empiezan a despegarse y Therapist, sports, puede recortar los que estn sueltos. No retire las tiras [facility] por completo a menos que el mdico se lo indique.  Controle la zona de la herida todos los das para detectar signos de infeccin. Est atento a los siguientes signos: ? Aumento del enrojecimiento, de la hinchazn o del dolor. ? Ms lquido Delorise Shiner. ? Calor. ? Pus o mal olor.  Pregntele al mdico si debe limpiar la herida con agua y [place]. Hacer esto puede incluir lo siguiente: ? Usar una toalla limpia para secar la herida dando palmaditas despus de limpiarla. No se frote ni restregue la herida. ? Aplicar una crema o un ungento. Hgalo nicamente como se lo haya indicado el mdico. ? Cubrir la incisin con un vendaje limpio.  Pregntele al mdico cundo puede dejar la herida al descubierto. Medicamentos  Si le recetaron un antibitico, una crema o un ungento con antibitico, tmelo o aplqueselo como se lo haya indicado el mdico. No deje de tomar o de usar el antibitico aunque la afeccin mejore.  Tome los medicamentos de venta libre y los recetados solamente como se lo haya indicado el mdico. Si le recetaron un analgsico, tmelo al menos 10minutos  antes de Optometrist el cuidado de la herida, Civil Service fast streamer se lo haya indicado el mdico. Instrucciones generales  Retome sus actividades normales como se lo haya indicado el mdico. Pregntele al mdico qu actividades son seguras.  No se rasque ni se toque la herida.  Concurra a todas las visitas de control como se lo haya indicado el mdico. Esto es importante.  Mantenga una dieta que incluya protenas, vitaminaA, vitaminaC y otros alimentos ricos en nutrientes. Estos alimentos ayudan a Insurance risk surveyor herida: ? Los alimentos ricos en protenas incluyen carne, productos lcteos, frijoles y nueces, entre otras fuentes de protena. ? Los alimentos ricos en vitaminaA incluyen zanahorias y verduras de hoja verde oscuro. ? Los alimentos ricos en vitaminaC incluyen ctricos, tomates y [place] frutas y verduras. ? Los alimentos ricos en nutrientes tienen protenas, carbohidratos, grasas, vitaminas o minerales. Consuma una variedad de alimentos saludables, que incluya frutas, verduras y Psychologist, prison and probation services. SOLICITE ATENCIN MDICA SI:  Le aplicaron la antitetnica y tiene hinchazn, dolor intenso, enrojecimiento o hemorragia en el lugar de la inyeccin.  El dolor no se alivia con los [employer].  Tiene ms enrojecimiento, hinchazn o dolor alrededor de la herida.  Observa lquido o sangre que proviene de la herida.  La herida est caliente al tacto.  Tiene pus o percibe mal olor que emana de la herida.  Tiene fiebre o siente escalofros.  Siente nuseas o vomita.  Tiene mareos.  SOLICITE ATENCIN MDICA DE INMEDIATO SI:  Tiene una lnea roja que sale  de la herida.  Los bordes de la herida se abren y se separan.  La herida est sangrando y la hemorragia no se detiene con una presin [place].  Tiene una erupcin cutnea.  Se desmaya.  Tiene dificultad para respirar.  Esta informacin no tiene Marine scientist el consejo del mdico. Asegrese de hacerle al mdico cualquier pregunta  que tenga. Document Released: 08/01/2011 Document Revised: 04/18/2015 Document Reviewed: 06/18/2016 Elsevier Interactive Patient Education  2017 [employer].

## 2017-12-31 NOTE — Op Note (Signed)
Operative Note     Preoperative diagnosis:   1. ESRD with functional permanent access  Postoperative diagnosis:  1. ESRD with functional permanent access  Procedure:  Removal of left jugular Permcath  Surgeon:  Leotis Pain, MD  Anesthesia:  Local  EBL:  Minimal  Indication for the Procedure:  The patient has a functional permanent dialysis access and no longer needs their permcath.  This can be removed.  Risks and benefits are discussed and informed consent is obtained.  Description of the Procedure:  The patient's left neck, chest and existing catheter were sterilely prepped and draped. The area around the catheter was anesthetized copiously with 1% lidocaine. The catheter was dissected out with curved hemostats until the cuff was freed from the surrounding fibrous sheath. The fiber sheath was transected, and the catheter was then removed in its entirety using gentle traction. Pressure was held and sterile dressings were placed. The patient tolerated the procedure well and was taken to the recovery room in stable condition.     Leotis Pain  12/31/2017, 1:50 PM This note was created with Dragon Medical transcription system. Any errors in dictation are purely unintentional.

## 2017-12-31 NOTE — H&P (Signed)
[facility] Admission History & Physical  MRN : [id number]  Martin Gilbert is a 77 y.o. (1941-08-21) male who presents with chief complaint of No chief complaint on file. Marland Kitchen  History of Present Illness: I am asked to evaluate the patient by the dialysis center. The patient was sent here because they have a nonfunctioning tunneled catheter and a functioning left arm AVF.  The patient reports they're not been any problems with any of their dialysis runs. They are reporting good flows with good parameters at dialysis.  Patient denies pain or tenderness overlying the access.  There is no pain with dialysis.  The patient denies hand pain or finger pain consistent with steal syndrome.  No fevers or chills while on dialysis.   No current facility-administered medications for this encounter.     Past Medical History:  Diagnosis Date  . Anemia   . Back pain   . BPH (benign prostatic hyperplasia)   . CKD (chronic kidney disease), stage IV ([place])   . Clostridium difficile colitis   . Diabetes mellitus without complication ([place])   . GERD (gastroesophageal reflux disease)   . HTN (hypertension)   . Hyperlipidemia     Past Surgical History:  Procedure Laterality Date  . AV FISTULA PLACEMENT Left 06/13/2017   Procedure: ARTERIOVENOUS (AV) FISTULA CREATION ( RADIAL CEPHALIC );  Surgeon: Katha Cabal, MD;  Location: [facility];  Service: Vascular;  Laterality: Left;  . CATARACT EXTRACTION     one eye not sure which  . DIALYSIS/PERMA CATHETER INSERTION N/A 04/03/2017   Procedure: Dialysis/Perma Catheter Insertion;  Surgeon: Algernon Huxley, MD;  Location: [place] [facility];  Service: Cardiovascular;  Laterality: N/A;  . DIALYSIS/PERMA CATHETER INSERTION N/A 08/06/2017   Procedure: DIALYSIS/PERMA CATHETER INSERTION;  Surgeon: Algernon Huxley, MD;  Location: [place] [facility];  Service: Cardiovascular;  Laterality: N/A;  . DIALYSIS/PERMA CATHETER REMOVAL N/A  08/04/2017   Procedure: DIALYSIS/PERMA CATHETER REMOVAL;  Surgeon: Algernon Huxley, MD;  Location: [place] [facility];  Service: Cardiovascular;  Laterality: N/A;  . hernia repair    . UPPER EXTREMITY ANGIOGRAPHY Left 09/05/2017   Procedure: Upper Extremity Angiography;  Surgeon: Katha Cabal, MD;  Location: [place] [facility];  Service: Cardiovascular;  Laterality: Left;    Social History Social History   Tobacco Use  . Smoking status: Never Smoker  . Smokeless tobacco: Never Used  Substance Use Topics  . Alcohol use: No    Comment: Drank in the past, but has stopped for several years.   . Drug use: No    Family History Family History  Problem Relation Age of Onset  . Hypertension Mother   . Diabetes Neg Hx     No family history of bleeding or clotting disorders, autoimmune disease or porphyria  No Known Allergies   REVIEW OF SYSTEMS (Negative unless checked)  Constitutional: [] Weight loss  [] Fever  [] Chills Cardiac: [] Chest pain   [] Chest pressure   [] Palpitations   [] Shortness of breath when laying flat   [] Shortness of breath at rest   [x] Shortness of breath with exertion. Vascular:  [] Pain in legs with walking   [] Pain in legs at rest   [] Pain in legs when laying flat   [] Claudication   [] Pain in feet when walking  [] Pain in feet at rest  [] Pain in feet when laying flat   [] History of DVT   [] Phlebitis   [] Swelling in legs   [] Varicose veins   []   Non-healing ulcers Pulmonary:   [] Uses home oxygen   [] Productive cough   [] Hemoptysis   [] Wheeze  [] COPD   [] Asthma Neurologic:  [] Dizziness  [] Blackouts   [] Seizures   [] History of stroke   [] History of TIA  [] Aphasia   [] Temporary blindness   [] Dysphagia   [] Weakness or numbness in arms   [] Weakness or numbness in legs Musculoskeletal:  [] Arthritis   [] Joint swelling   [] Joint pain   [] Low back pain Hematologic:  [] Easy bruising  [] Easy bleeding   [] Hypercoagulable state   [] Anemic  [] Hepatitis Gastrointestinal:  [] Blood in  stool   [] Vomiting blood  [] Gastroesophageal reflux/heartburn   [] Difficulty swallowing. Genitourinary:  [x] Chronic kidney disease   [] Difficult urination  [] Frequent urination  [] Burning with urination   [] Blood in urine Skin:  [] Rashes   [] Ulcers   [] Wounds Psychological:  [] History of anxiety   []  History of major depression.  Physical Examination  Vitals:   12/31/17 1209  Pulse: 68  Resp: 14  Temp: 97.7 F (36.5 C)  SpO2: 100%  Weight: 76.2 kg (168 lb)  Height: 5\' 8"  (1.727 m)   Body mass index is 25.54 kg/m. Gen: WD/WN, NAD Head: [place]/AT, No temporalis wasting.  Ear/Nose/Throat: Hearing grossly intact, nares w/o erythema or drainage, oropharynx w/o Erythema/Exudate,  Eyes: Conjunctiva clear, sclera non-icteric Neck: Trachea midline.  No JVD.  Pulmonary:  Good air movement, respirations not labored, no use of accessory muscles.  Cardiac: RRR, normal S1, S2. Vascular: good thrill in left forearm AVF Vessel Right Left  Radial Palpable Palpable               Musculoskeletal: M/S 5/5 throughout.  Extremities without ischemic changes.  No deformity or atrophy.  Neurologic: Sensation grossly intact in extremities.  Symmetrical.  Speech is fluent. Motor exam as listed above. Psychiatric: Judgment intact, Mood & affect appropriate for pt's clinical situation. Dermatologic: No rashes or ulcers noted.  No cellulitis or open wounds.    CBC Lab Results  Component Value Date   WBC 9.9 11/28/2017   HGB 10.0 (L) 11/28/2017   HCT 30.3 (L) 11/28/2017   MCV 94.8 11/28/2017   PLT 105 (L) 11/28/2017    BMET    Component Value Date/Time   NA 136 11/28/2017 0329   NA 138 04/08/2015 2133   K 4.0 11/28/2017 0329   K 4.0 04/08/2015 2133   CL 100 (L) 11/28/2017 0329   CL 108 04/08/2015 2133   CO2 24 11/28/2017 0329   CO2 25 04/08/2015 2133   GLUCOSE 276 (H) 11/28/2017 0329   GLUCOSE 181 (H) 04/08/2015 2133   BUN 54 (H) 11/28/2017 0329   BUN 60 (H) 04/08/2015 2133   CREATININE  5.60 (H) 11/28/2017 0329   CREATININE 4.04 (H) 04/08/2015 2133   CALCIUM 8.7 (L) 11/28/2017 0329   CALCIUM 8.5 (L) 04/08/2015 2133   GFRNONAA 9 (L) 11/28/2017 0329   GFRNONAA 14 (L) 04/08/2015 2133   GFRAA 10 (L) 11/28/2017 0329   GFRAA 16 (L) 04/08/2015 2133   CrCl cannot be calculated (Patient's most recent lab result is older than the maximum 21 days allowed.).  COAG Lab Results  Component Value Date   INR 1.05 11/25/2017   INR 1.05 06/04/2017   INR 1.35 03/27/2017    Radiology No results found.  Assessment/Plan  1.  End-stage renal disease requiring hemodialysis:  Patient's Tunneled catheter is not being used. The patient has an extremity access that is functioning well. Therefore, the patient will undergo removal  of the tunneled catheter under local anesthesia.  The risks and benefits were described to the patient.  All questions were answered.  The patient agrees to proceed with catheter removal 2.  Hypertension:  Patient will continue medical management; nephrology is following no changes in oral medications. 3.  Diabetes mellitus:  Glucose will be monitored and oral medications been held this morning once the patient has undergone the patient's procedure po intake will be reinitiated and again Accu-Cheks will be used to assess the blood glucose level and treat as needed. The patient will be restarted on the patient's usual hypoglycemic regime     Leotis Pain, MD  12/31/2017 1:13 PM

## 2018-01-01 ENCOUNTER — Encounter: Payer: Self-pay | Admitting: Vascular Surgery

## 2018-01-12 ENCOUNTER — Encounter (INDEPENDENT_AMBULATORY_CARE_PROVIDER_SITE_OTHER): Payer: Self-pay | Admitting: Vascular Surgery

## 2018-01-12 ENCOUNTER — Ambulatory Visit (INDEPENDENT_AMBULATORY_CARE_PROVIDER_SITE_OTHER): Payer: Medicare Other | Admitting: Vascular Surgery

## 2018-01-12 VITALS — BP 143/56 | HR 58 | Resp 16 | Wt 175.4 lb

## 2018-01-12 DIAGNOSIS — Z992 Dependence on renal dialysis: Secondary | ICD-10-CM | POA: Diagnosis not present

## 2018-01-12 DIAGNOSIS — E1165 Type 2 diabetes mellitus with hyperglycemia: Secondary | ICD-10-CM

## 2018-01-12 DIAGNOSIS — T829XXS Unspecified complication of cardiac and vascular prosthetic device, implant and graft, sequela: Secondary | ICD-10-CM | POA: Diagnosis not present

## 2018-01-12 DIAGNOSIS — N186 End stage renal disease: Secondary | ICD-10-CM

## 2018-01-12 DIAGNOSIS — I10 Essential (primary) hypertension: Secondary | ICD-10-CM

## 2018-01-12 NOTE — Progress Notes (Signed)
MRN : [id number]  Martin Gilbert is a 77 y.o. ([birth year]) male who presents with chief complaint of  Chief Complaint  Patient presents with  . Follow-up    3 month follow up  .  History of Present Illness: The patient returns to the office for followup of their dialysis access. The function of the access has been stable.  Patient was admitted last month with sepsis secondary to enterococcus.  He is status post removal of his permacath on December 31, 2017.  Most recent intervention on his left radiocephalic fistula was September 05, 2017 at which time the perianastomotic area was angioplastied to a maximal diameter of 4 mm   The patient denies increased bleeding time or increased recirculation. Patient denies difficulty with cannulation. The patient denies hand pain or other symptoms consistent with steal phenomena.  No significant arm swelling.  The patient denies redness or swelling at the access site. The patient denies fever or chills at home or while on dialysis.  The patient denies amaurosis fugax or recent TIA symptoms. There are no recent neurological changes noted. The patient denies claudication symptoms or rest pain symptoms. The patient denies history of DVT, PE or superficial thrombophlebitis. The patient denies recent episodes of angina or shortness of breath.      Current Meds  Medication Sig  . acetaminophen (TYLENOL) 325 MG tablet Take 2 tablets (650 mg total) by mouth every 6 (six) hours as needed for mild pain (or Fever >/= 101).  Marland Kitchen allopurinol (ZYLOPRIM) 100 MG tablet Take 100 mg by mouth daily.  Marland Kitchen aspirin 81 MG tablet Take 81 mg by mouth daily.  Marland Kitchen atorvastatin (LIPITOR) 20 MG tablet Take 20 mg by mouth daily.  . ferrous sulfate 325 (65 FE) MG tablet Take 325 mg by mouth 2 (two) times daily with a meal.   . glimepiride (AMARYL) 2 MG tablet Take 2 mg by mouth 2 (two) times daily.   . hydrALAZINE (APRESOLINE) 50 MG tablet Take 50 mg by mouth 3 (three)  times daily.  . insulin glargine (LANTUS) 100 UNIT/ML injection Inject 0.12 mLs (12 Units total) into the skin daily.  . insulin lispro (HUMALOG) 100 UNIT/ML injection Inject 0.03 mLs (3 Units total) into the skin 3 (three) times daily with meals.  . lidocaine-prilocaine (EMLA) cream Apply 1 application topically daily as needed.  . metoprolol succinate (TOPROL-XL) 25 MG 24 hr tablet Take 25 mg by mouth daily.  . pantoprazole (PROTONIX) 40 MG tablet Take 1 tablet (40 mg total) by mouth daily.  . Polyvinyl Alcohol-Povidone (REFRESH OP) Place 1 drop into both eyes 2 (two) times daily.  . sodium bicarbonate 650 MG tablet Take 650 mg by mouth 2 (two) times daily.  . tamsulosin (FLOMAX) 0.4 MG CAPS capsule Take 1 capsule (0.4 mg total) by mouth daily after supper.  . Vitamin D, Cholecalciferol, 1000 UNITS TABS Take 1 tablet by mouth daily.     Past Medical History:  Diagnosis Date  . Anemia   . Back pain   . BPH (benign prostatic hyperplasia)   . CKD (chronic kidney disease), stage IV ([place])   . Clostridium difficile colitis   . Diabetes mellitus without complication ([place])   . GERD (gastroesophageal reflux disease)   . HTN (hypertension)   . Hyperlipidemia     Past Surgical History:  Procedure Laterality Date  . AV FISTULA PLACEMENT Left 06/13/2017   Procedure: ARTERIOVENOUS (AV) FISTULA CREATION ( RADIAL CEPHALIC );  Surgeon: Katha Cabal,  MD;  Location: [facility];  Service: Vascular;  Laterality: Left;  . CATARACT EXTRACTION     one eye not sure which  . DIALYSIS/PERMA CATHETER INSERTION N/A 04/03/2017   Procedure: Dialysis/Perma Catheter Insertion;  Surgeon: Algernon Huxley, MD;  Location: [place] [facility];  Service: Cardiovascular;  Laterality: N/A;  . DIALYSIS/PERMA CATHETER INSERTION N/A 08/06/2017   Procedure: DIALYSIS/PERMA CATHETER INSERTION;  Surgeon: Algernon Huxley, MD;  Location: [place] [facility];  Service: Cardiovascular;  Laterality: N/A;  . DIALYSIS/PERMA CATHETER  REMOVAL N/A 08/04/2017   Procedure: DIALYSIS/PERMA CATHETER REMOVAL;  Surgeon: Algernon Huxley, MD;  Location: [place] [facility];  Service: Cardiovascular;  Laterality: N/A;  . DIALYSIS/PERMA CATHETER REMOVAL N/A 12/31/2017   Procedure: DIALYSIS/PERMA CATHETER REMOVAL;  Surgeon: Algernon Huxley, MD;  Location: [place] [facility];  Service: Cardiovascular;  Laterality: N/A;  . hernia repair    . UPPER EXTREMITY ANGIOGRAPHY Left 09/05/2017   Procedure: Upper Extremity Angiography;  Surgeon: Katha Cabal, MD;  Location: [place] [facility];  Service: Cardiovascular;  Laterality: Left;    Social History Social History   Tobacco Use  . Smoking status: Never Smoker  . Smokeless tobacco: Never Used  Substance Use Topics  . Alcohol use: No    Comment: Drank in the past, but has stopped for several years.   . Drug use: No    Family History Family History  Problem Relation Age of Onset  . Hypertension Mother   . Diabetes Neg Hx     No Known Allergies   REVIEW OF SYSTEMS (Negative unless checked)  Constitutional: [] Weight loss  [] Fever  [] Chills Cardiac: [] Chest pain   [] Chest pressure   [] Palpitations   [] Shortness of breath when laying flat   [] Shortness of breath with exertion. Vascular:  [] Pain in legs with walking   [] Pain in legs at rest  [] History of DVT   [] Phlebitis   [] Swelling in legs   [] Varicose veins   [] Non-healing ulcers Pulmonary:   [x] Uses home oxygen   [] Productive cough   [] Hemoptysis   [] Wheeze  [] COPD   [] Asthma Neurologic:  [] Dizziness   [] Seizures   [] History of stroke   [] History of TIA  [] Aphasia   [] Vissual changes   [] Weakness or numbness in arm   [] Weakness or numbness in leg Musculoskeletal:   [] Joint swelling   [] Joint pain   [] Low back pain Hematologic:  [] Easy bruising  [] Easy bleeding   [] Hypercoagulable state   [] Anemic Gastrointestinal:  [] Diarrhea   [] Vomiting  [] Gastroesophageal reflux/heartburn   [] Difficulty swallowing. Genitourinary:   [x] Chronic kidney disease   [] Difficult urination  [] Frequent urination   [] Blood in urine Skin:  [] Rashes   [] Ulcers  Psychological:  [] History of anxiety   []  History of major depression.  Physical Examination  Vitals:   01/12/18 1011  BP: (!) 143/56  Pulse: (!) 58  Resp: 16  Weight: 175 lb 6.4 oz (79.6 kg)   Body mass index is 26.67 kg/m. Gen: WD/WN, NAD Head: [place]/AT, No temporalis wasting.  Ear/Nose/Throat: Hearing grossly intact, nares w/o erythema or drainage Eyes: PER, EOMI, sclera nonicteric.  Neck: Supple, no large masses.   Pulmonary:  Good air movement, no audible wheezing bilaterally, no use of accessory muscles.  Cardiac: RRR, no JVD Vascular: Left AV fistula good thrill good bruit no hematoma Vessel Right Left  Radial Palpable Palpable  Ulnar Palpable Palpable  Brachial Palpable Palpable  Gastrointestinal: Non-distended. No guarding/no peritoneal signs.  Musculoskeletal: M/S 5/5  throughout.  No deformity or atrophy.  Neurologic: CN 2-12 intact. Symmetrical.  Speech is fluent. Motor exam as listed above. Psychiatric: Judgment intact, Mood & affect appropriate for pt's clinical situation. Dermatologic: No rashes or ulcers noted.  No changes consistent with cellulitis. Lymph : No lichenification or skin changes of chronic lymphedema.  CBC Lab Results  Component Value Date   WBC 9.9 11/28/2017   HGB 10.0 (L) 11/28/2017   HCT 30.3 (L) 11/28/2017   MCV 94.8 11/28/2017   PLT 105 (L) 11/28/2017    BMET    Component Value Date/Time   NA 136 11/28/2017 0329   NA 138 04/08/2015 2133   K 4.0 11/28/2017 0329   K 4.0 04/08/2015 2133   CL 100 (L) 11/28/2017 0329   CL 108 04/08/2015 2133   CO2 24 11/28/2017 0329   CO2 25 04/08/2015 2133   GLUCOSE 276 (H) 11/28/2017 0329   GLUCOSE 181 (H) 04/08/2015 2133   BUN 54 (H) 11/28/2017 0329   BUN 60 (H) 04/08/2015 2133   CREATININE 5.60 (H) 11/28/2017 0329   CREATININE 4.04 (H) 04/08/2015 2133   CALCIUM 8.7 (L)  11/28/2017 0329   CALCIUM 8.5 (L) 04/08/2015 2133   GFRNONAA 9 (L) 11/28/2017 0329   GFRNONAA 14 (L) 04/08/2015 2133   GFRAA 10 (L) 11/28/2017 0329   GFRAA 16 (L) 04/08/2015 2133   CrCl cannot be calculated (Patient's most recent lab result is older than the maximum 21 days allowed.).  COAG Lab Results  Component Value Date   INR 1.05 11/25/2017   INR 1.05 06/04/2017   INR 1.35 03/27/2017    Radiology No results found.   Assessment/Plan 1. ESRD on dialysis [facility]) Recommend:  The patient is doing well and currently has adequate dialysis access.  The patient's dialysis center is not reporting any access issues. 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  The patient will follow-up with me in the office after each ultrasound    - VAS [place] (AVF, AVG); Future  2. Complication of arteriovenous dialysis fistula, sequela Recommend:  The patient is doing well and currently has adequate dialysis access.  The patient's dialysis center is not reporting any access issues. 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  The patient will follow-up with me in the office after each ultrasound   - VAS [place] (AVF, AVG); Future  3. Essential hypertension Continue antihypertensive medications as already ordered, these medications have been reviewed and there are no changes at this time.   4. Uncontrolled type 2 diabetes mellitus with hyperglycemia ([place]) Continue hypoglycemic  medications as already ordered, these medications have been reviewed and there are no changes at this time.  Hgb A1C to be monitored as already arranged by primary service     Hortencia Pilar, MD  01/12/2018 10:31 AM

## 2018-01-14 ENCOUNTER — Encounter (INDEPENDENT_AMBULATORY_CARE_PROVIDER_SITE_OTHER): Payer: Self-pay | Admitting: Vascular Surgery

## 2018-04-13 ENCOUNTER — Encounter (INDEPENDENT_AMBULATORY_CARE_PROVIDER_SITE_OTHER): Payer: Medicare Other

## 2018-04-13 ENCOUNTER — Ambulatory Visit (INDEPENDENT_AMBULATORY_CARE_PROVIDER_SITE_OTHER): Payer: Medicare Other | Admitting: Vascular Surgery

## 2018-04-14 ENCOUNTER — Encounter (INDEPENDENT_AMBULATORY_CARE_PROVIDER_SITE_OTHER): Payer: Self-pay

## 2018-04-14 ENCOUNTER — Other Ambulatory Visit (INDEPENDENT_AMBULATORY_CARE_PROVIDER_SITE_OTHER): Payer: Self-pay | Admitting: Vascular Surgery

## 2018-04-19 MED ORDER — CEFAZOLIN SODIUM-DEXTROSE 1-4 GM/50ML-% IV SOLN
1.0000 g | Freq: Once | INTRAVENOUS | Status: AC
  Administered 2018-04-20: 1 g via INTRAVENOUS

## 2018-04-20 ENCOUNTER — Encounter
Admission: RE | Disposition: A | Discharge status: Home / Self Care | Payer: Self-pay | Source: Ambulatory Visit | Attending: Vascular Surgery

## 2018-04-20 ENCOUNTER — Ambulatory Visit
Admission: RE | Admit: 2018-04-20 | Discharge: 2018-04-20 | Disposition: A | Discharge status: Home / Self Care | Payer: Medicare Other | Source: Ambulatory Visit | Attending: Vascular Surgery | Admitting: Vascular Surgery

## 2018-04-20 DIAGNOSIS — N186 End stage renal disease: Secondary | ICD-10-CM | POA: Diagnosis not present

## 2018-04-20 DIAGNOSIS — E11649 Type 2 diabetes mellitus with hypoglycemia without coma: Secondary | ICD-10-CM | POA: Insufficient documentation

## 2018-04-20 DIAGNOSIS — E785 Hyperlipidemia, unspecified: Secondary | ICD-10-CM | POA: Insufficient documentation

## 2018-04-20 DIAGNOSIS — E1122 Type 2 diabetes mellitus with diabetic chronic kidney disease: Secondary | ICD-10-CM | POA: Diagnosis not present

## 2018-04-20 DIAGNOSIS — T82868A Thrombosis of vascular prosthetic devices, implants and grafts, initial encounter: Secondary | ICD-10-CM | POA: Diagnosis not present

## 2018-04-20 DIAGNOSIS — E119 Type 2 diabetes mellitus without complications: Secondary | ICD-10-CM | POA: Diagnosis not present

## 2018-04-20 DIAGNOSIS — K219 Gastro-esophageal reflux disease without esophagitis: Secondary | ICD-10-CM | POA: Diagnosis not present

## 2018-04-20 DIAGNOSIS — Z9849 Cataract extraction status, unspecified eye: Secondary | ICD-10-CM | POA: Diagnosis not present

## 2018-04-20 DIAGNOSIS — Z992 Dependence on renal dialysis: Secondary | ICD-10-CM | POA: Diagnosis not present

## 2018-04-20 DIAGNOSIS — Z8249 Family history of ischemic heart disease and other diseases of the circulatory system: Secondary | ICD-10-CM | POA: Insufficient documentation

## 2018-04-20 DIAGNOSIS — Y832 Surgical operation with anastomosis, bypass or graft as the cause of abnormal reaction of the patient, or of later complication, without mention of misadventure at the time of the procedure: Secondary | ICD-10-CM | POA: Insufficient documentation

## 2018-04-20 DIAGNOSIS — Z9889 Other specified postprocedural states: Secondary | ICD-10-CM | POA: Diagnosis not present

## 2018-04-20 DIAGNOSIS — T82858A Stenosis of vascular prosthetic devices, implants and grafts, initial encounter: Secondary | ICD-10-CM | POA: Insufficient documentation

## 2018-04-20 DIAGNOSIS — Z8619 Personal history of other infectious and parasitic diseases: Secondary | ICD-10-CM | POA: Diagnosis not present

## 2018-04-20 DIAGNOSIS — N400 Enlarged prostate without lower urinary tract symptoms: Secondary | ICD-10-CM | POA: Diagnosis not present

## 2018-04-20 DIAGNOSIS — I120 Hypertensive chronic kidney disease with stage 5 chronic kidney disease or end stage renal disease: Secondary | ICD-10-CM | POA: Diagnosis not present

## 2018-04-20 DIAGNOSIS — I10 Essential (primary) hypertension: Secondary | ICD-10-CM | POA: Diagnosis not present

## 2018-04-20 HISTORY — PX: A/V FISTULAGRAM: CATH118298

## 2018-04-20 LAB — POTASSIUM (ARMC VASCULAR LAB ONLY): POTASSIUM (ARMC VASCULAR LAB): 4.6 (ref 3.5–5.1)

## 2018-04-20 LAB — GLUCOSE, CAPILLARY: GLUCOSE-CAPILLARY: 107 mg/dL — AB (ref 65–99)

## 2018-04-20 SURGERY — A/V FISTULAGRAM
Anesthesia: Moderate Sedation | Laterality: Left

## 2018-04-20 MED ORDER — SODIUM CHLORIDE 0.9 % IV SOLN
INTRAVENOUS | Status: DC
  Administered 2018-04-20: 08:00:00 via INTRAVENOUS

## 2018-04-20 MED ORDER — METHYLPREDNISOLONE SODIUM SUCC 125 MG IJ SOLR: 125.0000 mg | INTRAMUSCULAR | Status: DC | PRN

## 2018-04-20 MED ORDER — HEPARIN SODIUM (PORCINE) 5000 UNIT/ML IJ SOLN
INTRAMUSCULAR | Status: DC | PRN
  Administered 2018-04-20: 3000 [IU] via INTRAVENOUS

## 2018-04-20 MED ORDER — HYDROMORPHONE HCL 1 MG/ML IJ SOLN: 1.0000 mg | Freq: Once | INTRAMUSCULAR | Status: DC | PRN

## 2018-04-20 MED ORDER — IOPAMIDOL (ISOVUE-300) INJECTION 61%
INTRAVENOUS | Status: DC | PRN
  Administered 2018-04-20: 20 mL via INTRA_ARTERIAL

## 2018-04-20 MED ORDER — FAMOTIDINE 20 MG PO TABS: 40.0000 mg | ORAL_TABLET | ORAL | Status: DC | PRN

## 2018-04-20 MED ORDER — FENTANYL CITRATE (PF) 100 MCG/2ML IJ SOLN
INTRAMUSCULAR | Status: DC | PRN
  Administered 2018-04-20: 50 ug via INTRAVENOUS
  Administered 2018-04-20: 25 ug via INTRAVENOUS

## 2018-04-20 MED ORDER — ONDANSETRON HCL 4 MG/2ML IJ SOLN
4.0000 mg | Freq: Four times a day (QID) | INTRAMUSCULAR | Status: DC | PRN
Start: 2018-04-20 — End: 2018-04-20

## 2018-04-20 MED ORDER — FENTANYL CITRATE (PF) 100 MCG/2ML IJ SOLN
INTRAMUSCULAR | Status: AC
  Filled 2018-04-20: qty 2

## 2018-04-20 MED ORDER — MIDAZOLAM HCL 2 MG/2ML IJ SOLN
INTRAMUSCULAR | Status: DC | PRN
  Administered 2018-04-20: 2 mg via INTRAVENOUS
  Administered 2018-04-20: 1 mg via INTRAVENOUS

## 2018-04-20 MED ORDER — HEPARIN (PORCINE) IN NACL 1000-0.9 UT/500ML-% IV SOLN
INTRAVENOUS | Status: AC
  Filled 2018-04-20: qty 1000

## 2018-04-20 MED ORDER — MIDAZOLAM HCL 5 MG/5ML IJ SOLN
INTRAMUSCULAR | Status: AC
  Filled 2018-04-20: qty 5

## 2018-04-20 MED ORDER — LIDOCAINE-EPINEPHRINE (PF) 1 %-1:200000 IJ SOLN
INTRAMUSCULAR | Status: AC
  Filled 2018-04-20: qty 30

## 2018-04-20 SURGICAL SUPPLY — 11 items
BALLN LUTONIX DCB 5X60X130 (BALLOONS) ×3
BALLOON LUTONIX DCB 5X60X130 (BALLOONS) ×1 IMPLANT
CANNULA 5F STIFF (CANNULA) ×3 IMPLANT
CATH BEACON 5 .035 40 KMP TP (CATHETERS) ×1 IMPLANT
CATH BEACON 5 .038 40 KMP TP (CATHETERS) ×2
DEVICE PRESTO INFLATION (MISCELLANEOUS) ×3 IMPLANT
DRAPE BRACHIAL (DRAPES) ×3 IMPLANT
PACK ANGIOGRAPHY (CUSTOM PROCEDURE TRAY) ×3 IMPLANT
SHEATH BRITE TIP 6FRX5.5 (SHEATH) ×3 IMPLANT
SUT MNCRL AB 4-0 PS2 18 (SUTURE) ×3 IMPLANT
WIRE MAGIC TOR.035 180C (WIRE) ×3 IMPLANT

## 2018-04-20 NOTE — Op Note (Signed)
[facility]    OPERATIVE NOTE   PROCEDURE: 1.   Left radiocephalic arteriovenous fistula cannulation under ultrasound guidance 2.   Left arm fistulagram including central venogram 3.   Percutaneous transluminal angioplasty of left perianastomotic cephalic vein with 5 mm diameter by 6 cm length Lutonix drug-coated angioplasty balloon  PRE-OPERATIVE DIAGNOSIS: 1. ESRD 2. Poorly functional left radiocephalic AVF  POST-OPERATIVE DIAGNOSIS: same as above   SURGEON: Leotis Pain, MD  ANESTHESIA: local with MCS  ESTIMATED BLOOD LOSS: 5 cc  FINDING(S): 1. 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  Narrowing around the sheath placement in the forearm cephalic vein but no true stenosis.  The upper arm outflow is through the basilic vein which is widely patent.  The central venous circulation is widely patent.  SPECIMEN(S):  None  CONTRAST: 20 cc  FLUORO TIME: 1.0 minute  MODERATE CONSCIOUS SEDATION TIME: Approximately 20 minutes with 3 mg of Versed and 75 Mcg of Fentanyl   INDICATIONS: Martin Gilbert is a 77 y.o. male who presents with malfunctioning left radiocephalic arteriovenous fistula.  The patient is scheduled for left arm fistulagram.  The patient is aware the risks include but are not limited to: bleeding, infection, thrombosis of the cannulated access, and possible anaphylactic reaction to the contrast.  The patient is aware of the risks of the procedure and elects to proceed forward.  DESCRIPTION: After full informed written consent was obtained, the patient was brought back to the angiography suite and placed supine upon the angiography table.  The patient was connected to monitoring equipment. Moderate conscious sedation was administered with a face to face encounter with the patient throughout the procedure with my supervision of the RN administering medicines and monitoring the  patient's vital signs and mental status throughout from the start of the procedure until the patient was taken to the recovery room. The left arm was prepped and draped in the standard fashion for a percutaneous access intervention.  Under ultrasound guidance, the left radiocephalic arteriovenous fistula was cannulated with a micropuncture needle under direct ultrasound guidance in the antecubital fossa in a retrograde fashion and a permanent image was performed.  The microwire was advanced into the fistula and the needle was exchanged for the a microsheath.  I then upsized to a 6 Fr Sheath.  I then used a Kumpe catheter and a Magic torque wire and advanced the catheter to the radiocephalic anastomosis and imaging was performed.  Hand injections were completed to image the access including the central venous system. This demonstrated hyperplastic stenosis in the cephalic vein just beyond the radiocephalic anastomosis in the 70 to 75% range.  Narrowing around the sheath placement in the forearm cephalic vein but no true stenosis.  The upper arm outflow is through the basilic vein which is widely patent.  The central venous circulation is widely patent.  Based on the images, this patient will need intervention to the perianastomotic cephalic vein. I then gave the patient 3000 units of intravenous heparin.  I then crossed the stenosis with a Magic Tourqe wire.  Based on the imaging, a 5 mm x 6 cm Lutonix drug-coated angioplasty balloon was selected.  The balloon was centered around the perianastomotic cephalic vein stenosis and inflated to 12 ATM for 1 minute(s).  On completion imaging, a 20-25 % residual stenosis was present.     Based on the completion imaging, no further intervention is necessary.  The  wire and balloon were removed from the sheath.  A 4-0 Monocryl purse-string suture was sewn around the sheath.  The sheath was removed while tying down the suture.  A sterile bandage was applied to the puncture  site.  COMPLICATIONS: None  CONDITION: Stable   Leotis Pain  04/20/2018 9:50 AM   This note was created with Dragon Medical transcription system. Any errors in dictation are purely unintentional.

## 2018-04-20 NOTE — Progress Notes (Signed)
Post-op instructions reviewed with pt. And his son via Maryruth Hancock. MD spoke with pt. At bedside at end of procedure. Both son and pt. Verbalized understanding of instructions per interpretor. Pt. Given drink and sandwich. No acute distress noted.

## 2018-04-20 NOTE — H&P (Signed)
[facility] Admission History & Physical  MRN : [id number]  Martin Gilbert is a 77 y.o. ([birth year]) male who presents with chief complaint of No chief complaint on file. Marland Kitchen  History of Present Illness: I am asked to evaluate the patient by the dialysis center. The patient was sent here because they were unable to achieve adequate dialysis this morning. Furthermore the Center states there is very poor thrill and bruit. The patient states there there have been increasing problems with the access, such as "pulling clots" during dialysis and prolonged bleeding after decannulation. The patient estimates these problems have been going on for several weeks. The patient is unaware of any other change.  Patient denies pain or tenderness overlying the access.  There is no pain with dialysis.  The patient denies hand pain or finger pain consistent with steal syndrome.   There have not been any recent past interventions or declots of this access.  The patient is not chronically hypotensive on dialysis.  Current Facility-Administered Medications  Medication Dose Route Frequency Provider Last Rate Last Dose  . 0.9 %  sodium chloride infusion   Intravenous Continuous Stegmayer, Kimberly A, PA-C      . ceFAZolin (ANCEF) IVPB 1 g/50 mL premix  1 g Intravenous Once Stegmayer, Kimberly A, PA-C      . famotidine (PEPCID) tablet 40 mg  40 mg Oral PRN Stegmayer, Janalyn Harder, PA-C      . HYDROmorphone (DILAUDID) injection 1 mg  1 mg Intravenous Once PRN Stegmayer, Kimberly A, PA-C      . methylPREDNISolone sodium succinate (SOLU-MEDROL) 125 mg/2 mL injection 125 mg  125 mg Intravenous PRN Stegmayer, Kimberly A, PA-C      . ondansetron (ZOFRAN) injection 4 mg  4 mg Intravenous Q6H PRN Stegmayer, Janalyn Harder, PA-C        Past Medical History:  Diagnosis Date  . Anemia   . Back pain   . BPH (benign prostatic hyperplasia)   . CKD (chronic kidney disease), stage IV ([place])   .  Clostridium difficile colitis   . Diabetes mellitus without complication ([place])   . GERD (gastroesophageal reflux disease)   . HTN (hypertension)   . Hyperlipidemia     Past Surgical History:  Procedure Laterality Date  . AV FISTULA PLACEMENT Left 06/13/2017   Procedure: ARTERIOVENOUS (AV) FISTULA CREATION ( RADIAL CEPHALIC );  Surgeon: Katha Cabal, MD;  Location: [facility];  Service: Vascular;  Laterality: Left;  . CATARACT EXTRACTION     one eye not sure which  . DIALYSIS/PERMA CATHETER INSERTION N/A 04/03/2017   Procedure: Dialysis/Perma Catheter Insertion;  Surgeon: Algernon Huxley, MD;  Location: [facility];  Service: Cardiovascular;  Laterality: N/A;  . DIALYSIS/PERMA CATHETER INSERTION N/A 08/06/2017   Procedure: DIALYSIS/PERMA CATHETER INSERTION;  Surgeon: Algernon Huxley, MD;  Location: [facility];  Service: Cardiovascular;  Laterality: N/A;  . DIALYSIS/PERMA CATHETER REMOVAL N/A 08/04/2017   Procedure: DIALYSIS/PERMA CATHETER REMOVAL;  Surgeon: Algernon Huxley, MD;  Location: [facility];  Service: Cardiovascular;  Laterality: N/A;  . DIALYSIS/PERMA CATHETER REMOVAL N/A 12/31/2017   Procedure: DIALYSIS/PERMA CATHETER REMOVAL;  Surgeon: Algernon Huxley, MD;  Location: [facility];  Service: Cardiovascular;  Laterality: N/A;  . hernia repair    . UPPER EXTREMITY ANGIOGRAPHY Left 09/05/2017   Procedure: Upper Extremity Angiography;  Surgeon: Katha Cabal, MD;  Location: [facility];  Service: Cardiovascular;  Laterality: Left;  Social History Social History   Tobacco Use  . Smoking status: Never Smoker  . Smokeless tobacco: Never Used  Substance Use Topics  . Alcohol use: No    Comment: Drank in the past, but has stopped for several years.   . Drug use: No    Family History Family History  Problem Relation Age of Onset  . Hypertension Mother   . Diabetes Neg Hx     No family history of bleeding or clotting disorders,  autoimmune disease or porphyria  No Known Allergies   REVIEW OF SYSTEMS (Negative unless checked)  Constitutional: [] Weight loss  [] Fever  [] Chills Cardiac: [] Chest pain   [] Chest pressure   [] Palpitations   [] Shortness of breath when laying flat   [] Shortness of breath at rest   [x] Shortness of breath with exertion. Vascular:  [] Pain in legs with walking   [] Pain in legs at rest   [] Pain in legs when laying flat   [] Claudication   [] Pain in feet when walking  [] Pain in feet at rest  [] Pain in feet when laying flat   [] History of DVT   [] Phlebitis   [] Swelling in legs   [] Varicose veins   [] Non-healing ulcers Pulmonary:   [] Uses home oxygen   [] Productive cough   [] Hemoptysis   [] Wheeze  [] COPD   [] Asthma Neurologic:  [] Dizziness  [] Blackouts   [] Seizures   [] History of stroke   [] History of TIA  [] Aphasia   [] Temporary blindness   [] Dysphagia   [] Weakness or numbness in arms   [] Weakness or numbness in legs Musculoskeletal:  [] Arthritis   [] Joint swelling   [] Joint pain   [] Low back pain Hematologic:  [] Easy bruising  [] Easy bleeding   [] Hypercoagulable state   [] Anemic  [] Hepatitis Gastrointestinal:  [] Blood in stool   [] Vomiting blood  [] Gastroesophageal reflux/heartburn   [] Difficulty swallowing. Genitourinary:  [x] Chronic kidney disease   [] Difficult urination  [] Frequent urination  [] Burning with urination   [] Blood in urine Skin:  [] Rashes   [] Ulcers   [] Wounds Psychological:  [] History of anxiety   []  History of major depression.  Physical Examination  There were no vitals filed for this visit. There is no height or weight on file to calculate BMI. Gen: WD/WN, NAD Head: [place]/AT, No temporalis wasting.  Ear/Nose/Throat: Hearing grossly intact, nares w/o erythema or drainage, oropharynx w/o Erythema/Exudate,  Eyes: Conjunctiva clear, sclera non-icteric Neck: Trachea midline.  No JVD.  Pulmonary:  Good air movement, respirations not labored, no use of accessory muscles.  Cardiac: RRR,  normal S1, S2. Vascular: thrill present in AVF Vessel Right Left  Radial Palpable Palpable               Musculoskeletal: M/S 5/5 throughout.  Extremities without ischemic changes.  No deformity or atrophy.  Neurologic: Sensation grossly intact in extremities.  Symmetrical.  Speech is fluent. Motor exam as listed above. Psychiatric: Judgment intact, Mood & affect appropriate for pt's clinical situation. Dermatologic: No rashes or ulcers noted.  No cellulitis or open wounds. Lymph    CBC Lab Results  Component Value Date   WBC 9.9 11/28/2017   HGB 10.0 (L) 11/28/2017   HCT 30.3 (L) 11/28/2017   MCV 94.8 11/28/2017   PLT 105 (L) 11/28/2017    BMET    Component Value Date/Time   NA 136 11/28/2017 0329   NA 138 04/08/2015 2133   K 4.0 11/28/2017 0329   K 4.0 04/08/2015 2133   CL 100 (L) 11/28/2017 0329   CL  108 04/08/2015 2133   CO2 24 11/28/2017 0329   CO2 25 04/08/2015 2133   GLUCOSE 276 (H) 11/28/2017 0329   GLUCOSE 181 (H) 04/08/2015 2133   BUN 54 (H) 11/28/2017 0329   BUN 60 (H) 04/08/2015 2133   CREATININE 5.60 (H) 11/28/2017 0329   CREATININE 4.04 (H) 04/08/2015 2133   CALCIUM 8.7 (L) 11/28/2017 0329   CALCIUM 8.5 (L) 04/08/2015 2133   GFRNONAA 9 (L) 11/28/2017 0329   GFRNONAA 14 (L) 04/08/2015 2133   GFRAA 10 (L) 11/28/2017 0329   GFRAA 16 (L) 04/08/2015 2133   CrCl cannot be calculated (Patient's most recent lab result is older than the maximum 21 days allowed.).  COAG Lab Results  Component Value Date   INR 1.05 11/25/2017   INR 1.05 06/04/2017   INR 1.35 03/27/2017    Radiology No results found.  Assessment/Plan 1.  Complication dialysis device with dysfunction of AV access:  Patient's dialysis access is malfunctioning. The patient will undergo angiography and correction of any problems using interventional techniques with the hope of restoring function to the access.  The risks and benefits were described to the patient.  All questions were  answered.  The patient agrees to proceed with angiography and intervention. Potassium will be drawn to ensure that it is an appropriate level prior to performing intervention. 2.  End-stage renal disease requiring hemodialysis:  Patient will continue dialysis therapy without further interruption if a successful intervention is not achieved then a tunneled catheter will be placed. Dialysis has already been arranged. 3.  Hypertension:  Patient will continue medical management; nephrology is following no changes in oral medications. 4.  Diabetes mellitus:  Glucose will be monitored and oral medications been held this morning once the patient has undergone the patient's procedure po intake will be reinitiated and again Accu-Cheks will be used to assess the blood glucose level and treat as needed. The patient will be restarted on the patient's usual hypoglycemic regime     Leotis Pain, MD  04/20/2018 8:13 AM

## 2018-04-20 NOTE — Progress Notes (Signed)
Pt. Arrived on unit, stating he is not sure why he is here. Unaware of any problems with fistula . Utilizing hospital interpretor Ronnald Collum. Call to MD to clarify situation. Andreas Ohm , RN called pt. Dialysis center-they stated difficulty with cannulation abnormal pressures.

## 2018-05-18 ENCOUNTER — Ambulatory Visit (INDEPENDENT_AMBULATORY_CARE_PROVIDER_SITE_OTHER): Payer: Medicare Other | Admitting: Vascular Surgery

## 2018-05-18 ENCOUNTER — Encounter

## 2018-05-18 ENCOUNTER — Encounter (INDEPENDENT_AMBULATORY_CARE_PROVIDER_SITE_OTHER): Payer: Medicare Other

## 2018-06-01 ENCOUNTER — Ambulatory Visit (INDEPENDENT_AMBULATORY_CARE_PROVIDER_SITE_OTHER): Payer: Medicare Other

## 2018-06-01 ENCOUNTER — Ambulatory Visit (INDEPENDENT_AMBULATORY_CARE_PROVIDER_SITE_OTHER): Payer: Medicare Other | Admitting: Vascular Surgery

## 2018-06-01 ENCOUNTER — Encounter (INDEPENDENT_AMBULATORY_CARE_PROVIDER_SITE_OTHER): Payer: Self-pay | Admitting: Vascular Surgery

## 2018-06-01 VITALS — BP 155/64 | HR 71 | Resp 16 | Ht 68.0 in | Wt 186.6 lb

## 2018-06-01 DIAGNOSIS — T829XXS Unspecified complication of cardiac and vascular prosthetic device, implant and graft, sequela: Secondary | ICD-10-CM

## 2018-06-01 DIAGNOSIS — Z992 Dependence on renal dialysis: Secondary | ICD-10-CM

## 2018-06-01 DIAGNOSIS — N186 End stage renal disease: Secondary | ICD-10-CM

## 2018-06-01 NOTE — Progress Notes (Signed)
Subjective:    Patient ID: Martin Gilbert, male    DOB: 01/26/1941, 77 y.o.   MRN: [id number] Chief Complaint  Patient presents with  . Follow-up    6wk HDA   Patient presents for his first post procedure follow-up.  The patient is status post a left upper extremity fistulogram with intervention on Apr 20, 2018.  The patient reports an uncomplicated postprocedure course.  The patient reports an improvement to the functioning of his left radiocephalic AV fistula since his recent intervention. The patient underwent a duplex ultrasound of the AV access which was notable for a patent fistula without any significant hemodynamic stenosis.  Total flow volume is 1196.  When compared to the previous exam on October 13, 2017 there is a significant increase in flow volume.  The patient denies any issues with hemodialysis such as cannulation problems, increased bleeding, decrease in doppler flow or recirculation. The patient also denies any fistula skin breakdown, pain, edema, pallor or ulceration of the arm / hand.  Patient denies any uremic symptoms.  Patient denies any fever, nausea vomiting.  Review of Systems  Constitutional: Negative.   HENT: Negative.   Eyes: Negative.   Respiratory: Negative.   Cardiovascular: Negative.   Gastrointestinal: Negative.   Endocrine: Negative.   Genitourinary:       ESRD  Musculoskeletal: Negative.   Skin: Negative.   Allergic/Immunologic: Negative.   Neurological: Negative.   Hematological: Negative.   Psychiatric/Behavioral: Negative.       Objective:   Physical Exam  Constitutional: He is oriented to person, place, and time. He appears well-developed and well-nourished. No distress.  HENT:  Head: Normocephalic and atraumatic.  Right Ear: External ear normal.  Left Ear: External ear normal.  Eyes: Pupils are equal, round, and reactive to light. Conjunctivae and EOM are normal.  Neck: Normal range of motion.  Cardiovascular: Normal rate, regular  rhythm, normal heart sounds and intact distal pulses.  Pulses:      Radial pulses are 2+ on the right side, and 2+ on the left side.  Left upper extremity radiocephalic AV fistula: Good bruit and thrill.  Skin is intact.  Pulmonary/Chest: Effort normal and breath sounds normal.  Musculoskeletal: Normal range of motion. He exhibits no edema.  Neurological: He is alert and oriented to person, place, and time.  Skin: Skin is warm and dry. He is not diaphoretic.  Psychiatric: He has a normal mood and affect. His behavior is normal. Judgment and thought content normal.  Vitals reviewed.  BP (!) 155/64 (BP Location: Right Arm)   Pulse 71   Resp 16   Ht 5\' 8"  (1.727 m)   Wt 186 lb 9.6 oz (84.6 kg)   BMI 28.37 kg/m   Past Medical History:  Diagnosis Date  . Anemia   . Back pain   . BPH (benign prostatic hyperplasia)   . CKD (chronic kidney disease), stage IV ([place])   . Clostridium difficile colitis   . Diabetes mellitus without complication ([place])   . GERD (gastroesophageal reflux disease)   . HTN (hypertension)   . Hyperlipidemia    Social History   Socioeconomic History  . Marital status: Married    Spouse name: Not on file  . Number of children: Not on file  . Years of education: Not on file  . Highest education level: Not on file  Occupational History  . Occupation: retired  Scientific laboratory technician  . Financial resource strain: Not on file  . Food insecurity:  Worry: Not on file    Inability: Not on file  . Transportation needs:    Medical: Not on file    Non-medical: Not on file  Tobacco Use  . Smoking status: Never Smoker  . Smokeless tobacco: Never Used  Substance and Sexual Activity  . Alcohol use: No    Comment: Drank in the past, but has stopped for several years.   . Drug use: No  . Sexual activity: Not on file  Lifestyle  . Physical activity:    Days per week: Not on file    Minutes per session: Not on file  . Stress: Not on file  Relationships  . Social  connections:    Talks on phone: Not on file    Gets together: Not on file    Attends religious service: Not on file    Active member of club or organization: Not on file    Attends meetings of clubs or organizations: Not on file    Relationship status: Not on file  . Intimate partner violence:    Fear of current or ex partner: Not on file    Emotionally abused: Not on file    Physically abused: Not on file    Forced sexual activity: Not on file  Other Topics Concern  . Not on file  Social History Narrative  . Not on file   Past Surgical History:  Procedure Laterality Date  . A/V FISTULAGRAM Left 04/20/2018   Procedure: A/V FISTULAGRAM;  Surgeon: Algernon Huxley, MD;  Location: [facility];  Service: Cardiovascular;  Laterality: Left;  . AV FISTULA PLACEMENT Left 06/13/2017   Procedure: ARTERIOVENOUS (AV) FISTULA CREATION ( RADIAL CEPHALIC );  Surgeon: Katha Cabal, MD;  Location: [facility];  Service: Vascular;  Laterality: Left;  . CATARACT EXTRACTION     one eye not sure which  . DIALYSIS/PERMA CATHETER INSERTION N/A 04/03/2017   Procedure: Dialysis/Perma Catheter Insertion;  Surgeon: Algernon Huxley, MD;  Location: [place] [facility];  Service: Cardiovascular;  Laterality: N/A;  . DIALYSIS/PERMA CATHETER INSERTION N/A 08/06/2017   Procedure: DIALYSIS/PERMA CATHETER INSERTION;  Surgeon: Algernon Huxley, MD;  Location: [place] [facility];  Service: Cardiovascular;  Laterality: N/A;  . DIALYSIS/PERMA CATHETER REMOVAL N/A 08/04/2017   Procedure: DIALYSIS/PERMA CATHETER REMOVAL;  Surgeon: Algernon Huxley, MD;  Location: [facility];  Service: Cardiovascular;  Laterality: N/A;  . DIALYSIS/PERMA CATHETER REMOVAL N/A 12/31/2017   Procedure: DIALYSIS/PERMA CATHETER REMOVAL;  Surgeon: Algernon Huxley, MD;  Location: [place] [facility];  Service: Cardiovascular;  Laterality: N/A;  . hernia repair    . UPPER EXTREMITY ANGIOGRAPHY Left 09/05/2017   Procedure: Upper Extremity Angiography;   Surgeon: Katha Cabal, MD;  Location: [place] [facility];  Service: Cardiovascular;  Laterality: Left;   Family History  Problem Relation Age of Onset  . Hypertension Mother   . Diabetes Neg Hx    No Known Allergies     Assessment & Plan:  Patient presents for his first post procedure follow-up.  The patient is status post a left upper extremity fistulogram with intervention on Apr 20, 2018.  The patient reports an uncomplicated postprocedure course.  The patient reports an improvement to the functioning of his left radiocephalic AV fistula since his recent intervention. The patient underwent a duplex ultrasound of the AV access which was notable for a patent fistula without any significant hemodynamic stenosis.  Total flow volume is 1196.  When compared  to the previous exam on October 13, 2017 there is a significant increase in flow volume.  The patient denies any issues with hemodialysis such as cannulation problems, increased bleeding, decrease in doppler flow or recirculation. The patient also denies any fistula skin breakdown, pain, edema, pallor or ulceration of the arm / hand.  Patient denies any uremic symptoms.  Patient denies any fever, nausea vomiting.  1. Complication of arteriovenous dialysis fistula, sequela - Stable Patient with improvement to his left upper extremity radiocephalic AV fistula since his recent intervention Studies reviewed with patient. The patient is doing well and currently has adequate dialysis access. Duplex ultrasound of the AV access shows a patent access with no evidence of hemodynamically significant strictures or stenosis.  The patient should continue to have duplex ultrasounds of the dialysis access every six months. The patient was instructed to call the office in the interim if any issues with dialysis access / doppler flow, pain, edema, pallor, fistula skin breakdown or ulceration of the arm / hand occur. The patient expressed their  understanding.  - VAS US DUPLEX DIALYSIS ACCESS (AVF,AVG); Future  2. ESRD on dialysis ([place]) - Stable As above  Current Outpatient Medications on File Prior to Visit  Medication Sig Dispense Refill  . acetaminophen (TYLENOL) 325 MG tablet Take 2 tablets (650 mg total) by mouth every 6 (six) hours as needed for mild pain (or Fever >/= 101).    Marland Kitchen allopurinol (ZYLOPRIM) 100 MG tablet Take 100 mg by mouth daily.    Marland Kitchen aspirin 81 MG tablet Take 81 mg by mouth daily.    Marland Kitchen atorvastatin (LIPITOR) 20 MG tablet Take 20 mg by mouth daily.    . ferrous sulfate 325 (65 FE) MG tablet Take 325 mg by mouth 2 (two) times daily with a meal.   11  . glimepiride (AMARYL) 2 MG tablet Take 2 mg by mouth 2 (two) times daily.     . hydrALAZINE (APRESOLINE) 50 MG tablet Take 50 mg by mouth 3 (three) times daily.    Marland Kitchen lidocaine-prilocaine (EMLA) cream Apply 1 application topically daily as needed.    . metoprolol succinate (TOPROL-XL) 25 MG 24 hr tablet Take 25 mg by mouth daily.    . multivitamin (RENA-VIT) TABS tablet Take 1 tablet by mouth at bedtime.  0  . pantoprazole (PROTONIX) 40 MG tablet Take 1 tablet (40 mg total) by mouth daily. 30 tablet 6  . Polyvinyl Alcohol-Povidone (REFRESH OP) Place 1 drop into both eyes 2 (two) times daily.    . sodium bicarbonate 650 MG tablet Take 650 mg by mouth 2 (two) times daily.    . tamsulosin (FLOMAX) 0.4 MG CAPS capsule Take 1 capsule (0.4 mg total) by mouth daily after supper. 30 capsule 0  . Vitamin D, Cholecalciferol, 1000 UNITS TABS Take 1 tablet by mouth daily.     . feeding supplement, GLUCERNA SHAKE, (GLUCERNA SHAKE) LIQD Take 237 mLs by mouth 3 (three) times daily between meals. (Patient not taking: Reported on 11/25/2017) 90 Can 6  . HYDROcodone-acetaminophen (NORCO) 5-325 MG tablet Take 1 tablet by mouth every 6 (six) hours as needed for moderate pain or severe pain. (Patient not taking: Reported on 11/25/2017) 25 tablet 0  . insulin glargine (LANTUS) 100 UNIT/ML  injection Inject 0.12 mLs (12 Units total) into the skin daily. (Patient not taking: Reported on 04/20/2018) 100 mL 0  . insulin lispro (HUMALOG) 100 UNIT/ML injection Inject 0.03 mLs (3 Units total) into the skin 3 (three)  times daily with meals. (Patient not taking: Reported on 04/20/2018) 100 mL 0  . Melatonin 5 MG TABS Take 1 tablet (5 mg total) by mouth at bedtime. (Patient not taking: Reported on 11/25/2017) 30 tablet 0  . ondansetron (ZOFRAN) 4 MG tablet Take 1 tablet (4 mg total) by mouth every 6 (six) hours as needed for nausea. (Patient not taking: Reported on 11/25/2017) 20 tablet 0  . predniSONE (STERAPRED UNI-PAK 21 TAB) 10 MG (21) TBPK tablet Take 1 tablet (10 mg total) by mouth daily. Take 6 tablets by mouth for 1 day followed by  5 tablets by mouth for 1 day followed by  4 tablets by mouth for 1 day followed by  3 tablets by mouth for 1 day followed by  2 tablets by mouth for 1 day followed by  1 tablet by mouth for a day and stop (Patient not taking: Reported on 12/31/2017) 21 tablet 0   No current facility-administered medications on file prior to visit.    There are no Patient Instructions on file for this visit. No follow-ups on file.  Salvadore Valvano A Jannett Schmall, PA-C

## 2018-09-29 ENCOUNTER — Other Ambulatory Visit: Payer: Self-pay

## 2018-09-29 ENCOUNTER — Observation Stay
Admission: EM | Admit: 2018-09-29 | Discharge: 2018-10-01 | Disposition: A | Discharge status: Home / Self Care | Payer: Medicare Other | Attending: Internal Medicine | Admitting: Internal Medicine

## 2018-09-29 ENCOUNTER — Encounter: Payer: Self-pay | Admitting: Emergency Medicine

## 2018-09-29 ENCOUNTER — Emergency Department: Payer: Medicare Other

## 2018-09-29 DIAGNOSIS — K219 Gastro-esophageal reflux disease without esophagitis: Secondary | ICD-10-CM | POA: Diagnosis not present

## 2018-09-29 DIAGNOSIS — S3210XA Unspecified fracture of sacrum, initial encounter for closed fracture: Secondary | ICD-10-CM | POA: Diagnosis present

## 2018-09-29 DIAGNOSIS — Z7982 Long term (current) use of aspirin: Secondary | ICD-10-CM | POA: Insufficient documentation

## 2018-09-29 DIAGNOSIS — I120 Hypertensive chronic kidney disease with stage 5 chronic kidney disease or end stage renal disease: Secondary | ICD-10-CM | POA: Diagnosis not present

## 2018-09-29 DIAGNOSIS — E785 Hyperlipidemia, unspecified: Secondary | ICD-10-CM | POA: Insufficient documentation

## 2018-09-29 DIAGNOSIS — M48061 Spinal stenosis, lumbar region without neurogenic claudication: Secondary | ICD-10-CM | POA: Insufficient documentation

## 2018-09-29 DIAGNOSIS — Z419 Encounter for procedure for purposes other than remedying health state, unspecified: Secondary | ICD-10-CM

## 2018-09-29 DIAGNOSIS — Z794 Long term (current) use of insulin: Secondary | ICD-10-CM | POA: Diagnosis not present

## 2018-09-29 DIAGNOSIS — M4848XA Fatigue fracture of vertebra, sacral and sacrococcygeal region, initial encounter for fracture: Principal | ICD-10-CM | POA: Insufficient documentation

## 2018-09-29 DIAGNOSIS — Z992 Dependence on renal dialysis: Secondary | ICD-10-CM | POA: Insufficient documentation

## 2018-09-29 DIAGNOSIS — N400 Enlarged prostate without lower urinary tract symptoms: Secondary | ICD-10-CM | POA: Diagnosis not present

## 2018-09-29 DIAGNOSIS — E876 Hypokalemia: Secondary | ICD-10-CM | POA: Diagnosis not present

## 2018-09-29 DIAGNOSIS — E1122 Type 2 diabetes mellitus with diabetic chronic kidney disease: Secondary | ICD-10-CM | POA: Diagnosis not present

## 2018-09-29 DIAGNOSIS — I700 Atherosclerosis of aorta: Secondary | ICD-10-CM | POA: Diagnosis not present

## 2018-09-29 DIAGNOSIS — N186 End stage renal disease: Secondary | ICD-10-CM | POA: Insufficient documentation

## 2018-09-29 DIAGNOSIS — Z79899 Other long term (current) drug therapy: Secondary | ICD-10-CM | POA: Diagnosis not present

## 2018-09-29 LAB — URINALYSIS, COMPLETE (UACMP) WITH MICROSCOPIC
Bilirubin Urine: NEGATIVE
GLUCOSE, UA: 50 mg/dL — AB
Hgb urine dipstick: NEGATIVE
Ketones, ur: NEGATIVE mg/dL
Nitrite: NEGATIVE
PROTEIN: 100 mg/dL — AB
SPECIFIC GRAVITY, URINE: 1.011 (ref 1.005–1.030)
pH: 8 (ref 5.0–8.0)

## 2018-09-29 LAB — SEDIMENTATION RATE: SED RATE: 66 mm/h — AB (ref 0–20)

## 2018-09-29 LAB — COMPREHENSIVE METABOLIC PANEL
ALK PHOS: 269 U/L — AB (ref 38–126)
ALT: 12 U/L (ref 0–44)
ANION GAP: 10 (ref 5–15)
AST: 26 U/L (ref 15–41)
Albumin: 3.2 g/dL — ABNORMAL LOW (ref 3.5–5.0)
BUN: 17 mg/dL (ref 8–23)
CALCIUM: 8.2 mg/dL — AB (ref 8.9–10.3)
CHLORIDE: 97 mmol/L — AB (ref 98–111)
CO2: 28 mmol/L (ref 22–32)
Creatinine, Ser: 3.9 mg/dL — ABNORMAL HIGH (ref 0.61–1.24)
GFR, EST AFRICAN AMERICAN: 16 mL/min — AB (ref >60)
GFR, EST NON AFRICAN AMERICAN: 13 mL/min — AB (ref >60)
Glucose, Bld: 176 mg/dL — ABNORMAL HIGH (ref 70–99)
Potassium: 2.9 mmol/L — ABNORMAL LOW (ref 3.5–5.1)
Sodium: 135 mmol/L (ref 135–145)
Total Bilirubin: 0.9 mg/dL (ref 0.3–1.2)
Total Protein: 6.7 g/dL (ref 6.5–8.1)

## 2018-09-29 LAB — CBC WITH DIFFERENTIAL/PLATELET
Abs Immature Granulocytes: 0.01 10*3/uL (ref 0.00–0.07)
BASOS ABS: 0 10*3/uL (ref 0.0–0.1)
Basophils Relative: 0 %
Eosinophils Absolute: 0.2 10*3/uL (ref 0.0–0.5)
Eosinophils Relative: 4 %
HCT: 31.3 % — ABNORMAL LOW (ref 39.0–52.0)
HEMOGLOBIN: 10.5 g/dL — AB (ref 13.0–17.0)
IMMATURE GRANULOCYTES: 0 %
Lymphocytes Relative: 35 %
Lymphs Abs: 1.6 10*3/uL (ref 0.7–4.0)
MCH: 32 pg (ref 26.0–34.0)
MCHC: 33.5 g/dL (ref 30.0–36.0)
MCV: 95.4 fL (ref 80.0–100.0)
Monocytes Absolute: 0.6 10*3/uL (ref 0.1–1.0)
Monocytes Relative: 12 %
NEUTROS PCT: 49 %
NRBC: 0 % (ref 0.0–0.2)
Neutro Abs: 2.2 10*3/uL (ref 1.7–7.7)
Platelets: 199 10*3/uL (ref 150–400)
RBC: 3.28 MIL/uL — AB (ref 4.22–5.81)
RDW: 13.2 % (ref 11.5–15.5)
WBC: 4.6 10*3/uL (ref 4.0–10.5)

## 2018-09-29 LAB — GLUCOSE, CAPILLARY: GLUCOSE-CAPILLARY: 117 mg/dL — AB (ref 70–99)

## 2018-09-29 LAB — MRSA PCR SCREENING: MRSA by PCR: NEGATIVE

## 2018-09-29 MED ORDER — ALLOPURINOL 100 MG PO TABS
100.0000 mg | ORAL_TABLET | Freq: Every day | ORAL | Status: DC
  Administered 2018-09-29 – 2018-10-01 (×2): 100 mg via ORAL
  Filled 2018-09-29 (×3): qty 1

## 2018-09-29 MED ORDER — SODIUM BICARBONATE 650 MG PO TABS
650.0000 mg | ORAL_TABLET | Freq: Two times a day (BID) | ORAL | Status: DC
  Administered 2018-09-29 – 2018-10-01 (×3): 650 mg via ORAL
  Filled 2018-09-29 (×5): qty 1

## 2018-09-29 MED ORDER — HYDRALAZINE HCL 50 MG PO TABS
50.0000 mg | ORAL_TABLET | Freq: Three times a day (TID) | ORAL | Status: DC
  Administered 2018-09-29 – 2018-09-30 (×2): 50 mg via ORAL
  Filled 2018-09-29 (×2): qty 1

## 2018-09-29 MED ORDER — RENA-VITE PO TABS
1.0000 | ORAL_TABLET | Freq: Every day | ORAL | Status: DC
  Administered 2018-09-29 – 2018-09-30 (×2): 1 via ORAL
  Filled 2018-09-29 (×3): qty 1

## 2018-09-29 MED ORDER — HEPARIN SODIUM (PORCINE) 5000 UNIT/ML IJ SOLN
5000.0000 [IU] | Freq: Three times a day (TID) | INTRAMUSCULAR | Status: DC
  Administered 2018-09-29: 5000 [IU] via SUBCUTANEOUS
  Filled 2018-09-29: qty 1

## 2018-09-29 MED ORDER — METOPROLOL SUCCINATE ER 25 MG PO TB24
25.0000 mg | ORAL_TABLET | Freq: Every day | ORAL | Status: DC
  Administered 2018-09-30: 25 mg via ORAL
  Filled 2018-09-29 (×2): qty 1

## 2018-09-29 MED ORDER — TAMSULOSIN HCL 0.4 MG PO CAPS
0.4000 mg | ORAL_CAPSULE | Freq: Every day | ORAL | Status: DC
  Administered 2018-09-30: 0.4 mg via ORAL
  Filled 2018-09-29: qty 1

## 2018-09-29 MED ORDER — FERROUS SULFATE 325 (65 FE) MG PO TABS
325.0000 mg | ORAL_TABLET | Freq: Two times a day (BID) | ORAL | Status: DC
  Administered 2018-09-30 – 2018-10-01 (×2): 325 mg via ORAL
  Filled 2018-09-29 (×2): qty 1

## 2018-09-29 MED ORDER — DOCUSATE SODIUM 100 MG PO CAPS: 100.0000 mg | ORAL_CAPSULE | Freq: Two times a day (BID) | ORAL | Status: DC | PRN

## 2018-09-29 MED ORDER — HYDROCODONE-ACETAMINOPHEN 5-325 MG PO TABS
1.0000 | ORAL_TABLET | Freq: Four times a day (QID) | ORAL | Status: DC | PRN
  Administered 2018-09-29 – 2018-10-01 (×3): 1 via ORAL
  Filled 2018-09-29 (×3): qty 1

## 2018-09-29 MED ORDER — ATORVASTATIN CALCIUM 20 MG PO TABS
20.0000 mg | ORAL_TABLET | Freq: Every day | ORAL | Status: DC
  Administered 2018-09-29 – 2018-10-01 (×2): 20 mg via ORAL
  Filled 2018-09-29 (×2): qty 1

## 2018-09-29 MED ORDER — ACETAMINOPHEN 325 MG PO TABS: 650.0000 mg | ORAL_TABLET | Freq: Four times a day (QID) | ORAL | Status: DC | PRN

## 2018-09-29 MED ORDER — GLIMEPIRIDE 2 MG PO TABS
2.0000 mg | ORAL_TABLET | Freq: Two times a day (BID) | ORAL | Status: DC
  Administered 2018-09-29: 2 mg via ORAL
  Filled 2018-09-29 (×2): qty 1

## 2018-09-29 MED ORDER — PANTOPRAZOLE SODIUM 40 MG PO TBEC
40.0000 mg | DELAYED_RELEASE_TABLET | Freq: Every day | ORAL | Status: DC
  Administered 2018-09-29 – 2018-10-01 (×2): 40 mg via ORAL
  Filled 2018-09-29 (×2): qty 1

## 2018-09-29 MED ORDER — VITAMIN D 1000 UNITS PO TABS
1000.0000 [IU] | ORAL_TABLET | Freq: Every day | ORAL | Status: DC
  Administered 2018-09-29 – 2018-10-01 (×2): 1000 [IU] via ORAL
  Filled 2018-09-29 (×2): qty 1

## 2018-09-29 MED ORDER — INSULIN ASPART 100 UNIT/ML ~~LOC~~ SOLN
0.0000 [IU] | Freq: Three times a day (TID) | SUBCUTANEOUS | Status: DC
  Administered 2018-09-30: 1 [IU] via SUBCUTANEOUS
  Filled 2018-09-29: qty 1

## 2018-09-29 MED ORDER — ASPIRIN EC 81 MG PO TBEC
81.0000 mg | DELAYED_RELEASE_TABLET | Freq: Every day | ORAL | Status: DC
  Administered 2018-09-29 – 2018-10-01 (×2): 81 mg via ORAL
  Filled 2018-09-29 (×2): qty 1

## 2018-09-29 NOTE — ED Notes (Signed)
Daughter [name] [phone] will be leaving to take care of children.   Angie given telephone to answer screening questions from MRI

## 2018-09-29 NOTE — ED Notes (Signed)
Dr. Cinda Quest and interpreter Montey Hora at bedside. Pt presents with back pain that goes down to knees in back of left leg. States he has weakness in legs; denies trouble with bowels/urine (pt had bm while in room). Pt reports that it gets worse when he walks, stands. Pt was seen at clinic and given robaxin, tylenol, prednisone, with no relief. Pt points to middle of back, as well as left side. Pt alert & oriented with NAD noted.

## 2018-09-29 NOTE — H&P (Signed)
[facility] [place] NAME: Martin Gilbert    MR#:  [id number]  DATE OF BIRTH:  1941/09/20  DATE OF ADMISSION:  09/29/2018  PRIMARY CARE PHYSICIAN: [name], MD   REQUESTING/REFERRING PHYSICIAN: malinda  CHIEF COMPLAINT:   Chief Complaint  Patient presents with  . Back Pain    HISTORY OF PRESENT ILLNESS: Martin Gilbert  is a 77 y.o. male with a known history of BPH, ESRD on HD, DM, HTN, HLD- had pain in lower back- affecting his walking .  He went to primary care physician last week and was told it could be muscle related and was given some pain medication and muscle relaxer but it did not help much. Came to emergency room today and found to have some fractures on the sacrum on MRI. ER physician spoke to patient and family and gave her an option to go home with home health and physical therapy and pain management but he said his activities are significantly limited because of this and he may not be able to go home so ER physician suggested to admit to hospitalist service and get consult by Dr. Rudene Christians tomorrow, to get kyphoplasty.  PAST MEDICAL HISTORY:   Past Medical History:  Diagnosis Date  . Anemia   . Back pain   . BPH (benign prostatic hyperplasia)   . CKD (chronic kidney disease), stage IV ([place])   . Clostridium difficile colitis   . Diabetes mellitus without complication ([place])   . GERD (gastroesophageal reflux disease)   . HTN (hypertension)   . Hyperlipidemia     PAST SURGICAL HISTORY:  Past Surgical History:  Procedure Laterality Date  . A/V FISTULAGRAM Left 04/20/2018   Procedure: A/V FISTULAGRAM;  Surgeon: Algernon Huxley, MD;  Location: [facility];  Service: Cardiovascular;  Laterality: Left;  . AV FISTULA PLACEMENT Left 06/13/2017   Procedure: ARTERIOVENOUS (AV) FISTULA CREATION ( RADIAL CEPHALIC );  Surgeon: Katha Cabal, MD;  Location: [facility];  Service: Vascular;  Laterality: Left;   . CATARACT EXTRACTION     one eye not sure which  . DIALYSIS/PERMA CATHETER INSERTION N/A 04/03/2017   Procedure: Dialysis/Perma Catheter Insertion;  Surgeon: Algernon Huxley, MD;  Location: [facility];  Service: Cardiovascular;  Laterality: N/A;  . DIALYSIS/PERMA CATHETER INSERTION N/A 08/06/2017   Procedure: DIALYSIS/PERMA CATHETER INSERTION;  Surgeon: Algernon Huxley, MD;  Location: [facility];  Service: Cardiovascular;  Laterality: N/A;  . DIALYSIS/PERMA CATHETER REMOVAL N/A 08/04/2017   Procedure: DIALYSIS/PERMA CATHETER REMOVAL;  Surgeon: Algernon Huxley, MD;  Location: [facility];  Service: Cardiovascular;  Laterality: N/A;  . DIALYSIS/PERMA CATHETER REMOVAL N/A 12/31/2017   Procedure: DIALYSIS/PERMA CATHETER REMOVAL;  Surgeon: Algernon Huxley, MD;  Location: [facility];  Service: Cardiovascular;  Laterality: N/A;  . hernia repair    . UPPER EXTREMITY ANGIOGRAPHY Left 09/05/2017   Procedure: Upper Extremity Angiography;  Surgeon: Katha Cabal, MD;  Location: [facility];  Service: Cardiovascular;  Laterality: Left;    SOCIAL HISTORY:  Social History   Tobacco Use  . Smoking status: Never Smoker  . Smokeless tobacco: Never Used  Substance Use Topics  . Alcohol use: No    Comment: Drank in the past, but has stopped for several years.     FAMILY HISTORY:  Family History  Problem Relation Age of Onset  . Hypertension Mother   . Diabetes Neg Hx  DRUG ALLERGIES: No Known Allergies  REVIEW OF SYSTEMS:   CONSTITUTIONAL: No fever, fatigue or weakness.  EYES: No blurred or double vision.  EARS, NOSE, AND THROAT: No tinnitus or ear pain.  RESPIRATORY: No cough, shortness of breath, wheezing or hemoptysis.  CARDIOVASCULAR: No chest pain, orthopnea, edema.  GASTROINTESTINAL: No nausea, vomiting, diarrhea or abdominal pain.  GENITOURINARY: No dysuria, hematuria.  ENDOCRINE: No polyuria, nocturia,  HEMATOLOGY: No anemia, easy bruising or  bleeding SKIN: No rash or lesion. MUSCULOSKELETAL: No joint pain or arthritis.   NEUROLOGIC: No tingling, numbness, weakness.  PSYCHIATRY: No anxiety or depression.   MEDICATIONS AT HOME:  Prior to Admission medications   Medication Sig Start Date End Date Taking? Authorizing Provider  methocarbamol (ROBAXIN) 750 MG tablet Take 750 mg by mouth 4 (four) times daily.   Yes [provider]  acetaminophen (TYLENOL) 325 MG tablet Take 2 tablets (650 mg total) by mouth every 6 (six) hours as needed for mild pain (or Fever >/= 101). 11/28/17   [name], MD  allopurinol (ZYLOPRIM) 100 MG tablet Take 100 mg by mouth daily. 10/17/15   [provider]  aspirin 81 MG tablet Take 81 mg by mouth daily.    [provider]  atorvastatin (LIPITOR) 20 MG tablet Take 20 mg by mouth daily.    [provider]  feeding supplement, GLUCERNA SHAKE, (GLUCERNA SHAKE) LIQD Take 237 mLs by mouth 3 (three) times daily between meals. Patient not taking: Reported on 11/25/2017 02/02/17   Theodoro Grist, MD  ferrous sulfate 325 (65 FE) MG tablet Take 325 mg by mouth 2 (two) times daily with a meal.  08/30/15   [provider]  glimepiride (AMARYL) 2 MG tablet Take 2 mg by mouth 2 (two) times daily.  04/24/17   [provider]  hydrALAZINE (APRESOLINE) 50 MG tablet Take 50 mg by mouth 3 (three) times daily.    [provider]  HYDROcodone-acetaminophen (NORCO) 5-325 MG tablet Take 1 tablet by mouth every 6 (six) hours as needed for moderate pain or severe pain. Patient not taking: Reported on 11/25/2017 08/06/17   Fritzi Mandes, MD  insulin glargine (LANTUS) 100 UNIT/ML injection Inject 0.12 mLs (12 Units total) into the skin daily. Patient not taking: Reported on 04/20/2018 11/29/17   [name], MD  insulin lispro (HUMALOG) 100 UNIT/ML injection Inject 0.03 mLs (3 Units total) into the skin 3 (three) times daily with meals. Patient not taking: Reported on 04/20/2018  11/28/17   [name], MD  lidocaine-prilocaine (EMLA) cream Apply 1 application topically daily as needed. 10/16/17   [provider]  Melatonin 5 MG TABS Take 1 tablet (5 mg total) by mouth at bedtime. Patient not taking: Reported on 11/25/2017 04/04/17   Gladstone Lighter, MD  metoprolol succinate (TOPROL-XL) 25 MG 24 hr tablet Take 25 mg by mouth daily.    [provider]  multivitamin (RENA-VIT) TABS tablet Take 1 tablet by mouth at bedtime. 11/28/17   Gouru, Illene Silver, MD  ondansetron (ZOFRAN) 4 MG tablet Take 1 tablet (4 mg total) by mouth every 6 (six) hours as needed for nausea. Patient not taking: Reported on 11/25/2017 02/02/17   Theodoro Grist, MD  pantoprazole (PROTONIX) 40 MG tablet Take 1 tablet (40 mg total) by mouth daily. 02/03/17   Theodoro Grist, MD  Polyvinyl Alcohol-Povidone (REFRESH OP) Place 1 drop into both eyes 2 (two) times daily.    [provider]  predniSONE (STERAPRED UNI-PAK 21 TAB) 10 MG (21) TBPK  tablet Take 1 tablet (10 mg total) by mouth daily. Take 6 tablets by mouth for 1 day followed by  5 tablets by mouth for 1 day followed by  4 tablets by mouth for 1 day followed by  3 tablets by mouth for 1 day followed by  2 tablets by mouth for 1 day followed by  1 tablet by mouth for a day and stop Patient not taking: Reported on 12/31/2017 11/28/17   [name], MD  sodium bicarbonate 650 MG tablet Take 650 mg by mouth 2 (two) times daily.    [provider]  tamsulosin (FLOMAX) 0.4 MG CAPS capsule Take 1 capsule (0.4 mg total) by mouth daily after supper. 02/19/17   Epifanio Lesches, MD  Vitamin D, Cholecalciferol, 1000 UNITS TABS Take 1 tablet by mouth daily.     [provider]      PHYSICAL EXAMINATION:   VITAL SIGNS: Blood pressure (!) 153/65, pulse 65, temperature 98.3 F (36.8 C), temperature source Oral, resp. rate 17, height 5\' 11"  (1.803 m), weight 74.8 kg, SpO2 99 %.  GENERAL:  77 y.o.-year-old patient  lying in the bed with no acute distress.  EYES: Pupils equal, round, reactive to light and accommodation. No scleral icterus. Extraocular muscles intact.  HEENT: Head atraumatic, normocephalic. Oropharynx and nasopharynx clear.  NECK:  Supple, no jugular venous distention. No thyroid enlargement, no tenderness.  LUNGS: Normal breath sounds bilaterally, no wheezing, rales,rhonchi or crepitation. No use of accessory muscles of respiration.  CARDIOVASCULAR: S1, S2 normal. No murmurs, rubs, or gallops.  ABDOMEN: Soft, nontender, nondistended. Bowel sounds present. No organomegaly or mass.  EXTREMITIES: No pedal edema, cyanosis, or clubbing. Left fore arm- AV fistula NEUROLOGIC: Cranial nerves II through XII are intact. Muscle strength 4/5 in all extremities. Sensation intact. Gait not checked.  PSYCHIATRIC: The patient is alert and oriented x 3.  SKIN: No obvious rash, lesion, or ulcer.   LABORATORY PANEL:   CBC Recent Labs  Lab 09/29/18 1344  WBC 4.6  HGB 10.5*  HCT 31.3*  PLT 199  MCV 95.4  MCH 32.0  MCHC 33.5  RDW 13.2  LYMPHSABS 1.6  MONOABS 0.6  EOSABS 0.2  BASOSABS 0.0   ------------------------------------------------------------------------------------------------------------------  Chemistries  Recent Labs  Lab 09/29/18 1344  NA 135  K 2.9*  CL 97*  CO2 28  GLUCOSE 176*  BUN 17  CREATININE 3.90*  CALCIUM 8.2*  AST 26  ALT 12  ALKPHOS 269*  BILITOT 0.9   ------------------------------------------------------------------------------------------------------------------ estimated creatinine clearance is 16.5 mL/min (A) (by C-G formula based on SCr of 3.9 mg/dL (H)). ------------------------------------------------------------------------------------------------------------------ No results for input(s): TSH, T4TOTAL, T3FREE, THYROIDAB in the last 72 hours.  Invalid input(s): FREET3   Coagulation profile No results for input(s): INR, PROTIME in the last 168  hours. ------------------------------------------------------------------------------------------------------------------- No results for input(s): DDIMER in the last 72 hours. -------------------------------------------------------------------------------------------------------------------  Cardiac Enzymes No results for input(s): CKMB, TROPONINI, MYOGLOBIN in the last 168 hours.  Invalid input(s): CK ------------------------------------------------------------------------------------------------------------------ Invalid input(s): POCBNP  ---------------------------------------------------------------------------------------------------------------  Urinalysis    Component Value Date/Time   COLORURINE YELLOW (A) 09/29/2018 1802   APPEARANCEUR CLEAR (A) 09/29/2018 1802   APPEARANCEUR Turbid 08/29/2013 2049   LABSPEC 1.011 09/29/2018 1802   LABSPEC 1.015 08/29/2013 2049   PHURINE 8.0 09/29/2018 1802   GLUCOSEU 50 (A) 09/29/2018 1802   GLUCOSEU 50 mg/dL 08/29/2013 2049   HGBUR NEGATIVE 09/29/2018 1802   BILIRUBINUR NEGATIVE 09/29/2018 1802   BILIRUBINUR Negative 08/29/2013 2049   KETONESUR NEGATIVE 09/29/2018  1802   PROTEINUR 100 (A) 09/29/2018 1802   NITRITE NEGATIVE 09/29/2018 1802   LEUKOCYTESUR TRACE (A) 09/29/2018 1802   LEUKOCYTESUR 3+ 08/29/2013 2049     RADIOLOGY: Ct Abdomen Pelvis Wo Contrast  Result Date: 09/29/2018 CLINICAL DATA:  Back pain and weakness EXAM: CT ABDOMEN AND PELVIS WITHOUT CONTRAST TECHNIQUE: Multidetector CT imaging of the abdomen and pelvis was performed following the standard protocol without IV contrast. COMPARISON:  03/29/2017 FINDINGS: Lower chest: Minimal scarring is noted in the left lower lobe. No focal infiltrate or sizable parenchymal nodule is seen. Hepatobiliary: Mild nodularity of the liver is again identified suspicious for cirrhosis. No gallstones, gallbladder wall thickening, or biliary dilatation. Pancreas: Unremarkable. No  pancreatic ductal dilatation or surrounding inflammatory changes. Spleen: Within normal limits. Adrenals/Urinary Tract: Adrenal glands are within normal limits. Kidneys demonstrate cortical thinning but stable from the prior exam. A cystic lesion is noted in the upper pole of the left kidney. No calculi or obstructive changes are noted. Mild renal vascular calcifications are seen. The bladder is partially distended. Stomach/Bowel: Stomach is within normal limits. Appendix is not well seen although no inflammatory changes to suggest appendicitis are noted. No evidence of bowel wall thickening, distention, or inflammatory changes. Vascular/Lymphatic: Aortic atherosclerosis. No enlarged abdominal or pelvic lymph nodes. Reproductive: Prostate is unremarkable. Other: No abdominal wall hernia or abnormality. No abdominopelvic ascites. Musculoskeletal: Degenerative changes of lumbar spine are seen. No compression deformities are noted. No significant disc pathology is noted. IMPRESSION: Chronic changes as described above without acute abnormality. Electronically Signed   By: Inez Catalina M.D.   On: 09/29/2018 14:23   Mr Lumbar Spine Wo Contrast  Result Date: 09/29/2018 CLINICAL DATA:  Initial evaluation for acute back pain radiating into the left lower extremity for 3 weeks, decreased mobility. EXAM: MRI LUMBAR SPINE WITHOUT CONTRAST TECHNIQUE: Multiplanar, multisequence MR imaging of the lumbar spine was performed. No intravenous contrast was administered. COMPARISON:  Prior CT from earlier same day as well as previous MRI from 02/01/2017. FINDINGS: Segmentation: Normal segmentation. Lowest well-formed disc labeled the L5-S1 level. Alignment: Trace 2 mm retrolisthesis of L1 on L2. Mild dextroscoliosis. Vertebral bodies otherwise normally aligned with preservation of the normal lumbar lordosis. Vertebrae: Abnormal marrow edema within the bilateral sacral ala, consistent with acute sacral insufficiency type fractures,  primarily involving the S2 segment. Vertebral body heights maintained with no other acute or chronic fracture identified. Underlying bone marrow signal intensity mildly heterogeneous but within normal limits. No worrisome osseous lesions. No other abnormal marrow edema. Conus medullaris and cauda equina: Conus extends to the L1 level. Conus and cauda equina appear normal. Paraspinal and other soft tissues: Confluent edema within the lower posterior paraspinous musculature as well as the presacral soft tissues, likely reactive in nature due to sacral insufficiency type fractures. A degree of associated muscular strain is suspected. Paraspinous soft tissues otherwise within normal limits. Visualized kidneys are atrophic, consistent with history of end-stage renal disease. Single subcentimeter T2 hyperintense simple cyst noted within the left kidney. Disc levels: T12-L1: Unremarkable. L1-2: Trace retrolisthesis. Diffuse disc bulge with disc desiccation. No significant canal or foraminal stenosis. L2-3: Mild annular disc bulge. Small annular fissure at the level of the left lateral recess/foramen. Mild bilateral facet and ligament flavum hypertrophy. No significant canal or neural foraminal stenosis. No impingement. L3-4: Diffuse disc bulge with disc desiccation. Mild facet hypertrophy. Resultant mild left lateral recess narrowing without significant canal stenosis. Mild bilateral L3 foraminal narrowing, right greater than left. L4-5: Diffuse disc  bulge with disc desiccation and mild intervertebral disc space narrowing. Mild reactive endplate changes with marginal endplate osteophytic spurring. Moderate facet and ligament flavum hypertrophy. Left paracentral annular fissure. Mild to moderate canal with left greater than right lateral recess stenosis, relatively unchanged from previous. Mild bilateral L4 foraminal narrowing. L5-S1: Mild disc bulge with disc desiccation. Right paracentral disc protrusion. Mild epidural  lipomatosis, partially effacing the distal thecal sac. No other significant canal or lateral recess stenosis. Foramina remain patent. IMPRESSION: 1. Acute sacral insufficiency type fractures involving the bilateral sacral ala, presumably the source of patient's acute lower back pain. 2. Confluent edema within the lower posterior paraspinous musculature, likely reflecting a degree of associated muscular strain/injury. 3. 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 Electronically Signed   By: Jeannine Boga M.D.   On: 09/29/2018 17:03    EKG: Orders placed or performed during the hospital encounter of 09/29/18  . EKG 12-Lead  . EKG 12-Lead    IMPRESSION AND PLAN:  *Sacral fracture We will get orthopedic consult after admitting him. May need kyphoplasty. Pain management as needed.  *End-stage renal disease on hemodialysis Nephrology consult.  *Hypertension Continue home medications.  *Hyperlipidemia Continue atorvastatin.  *Diabetes Continue glimepiride and keep on sliding scale coverage.   All the records are reviewed and case discussed with ED provider. Management plans discussed with the patient, family and they are in agreement.   CODE STATUS: Full. Code Status History    Date Active Date Inactive Code Status Order ID Comments User Context   11/25/2017 1939 11/28/2017 2014 Full Code [id number]  Dustin Flock, MD ED   09/05/2017 1508 09/05/2017 1955 Full Code [id number]  Schnier, Dolores Lory, MD Inpatient   08/02/2017 1834 08/06/2017 1959 Full Code [id number]  Loletha Grayer, MD ED   03/27/2017 0542 04/04/2017 2141 Full Code [id number]  Saundra Shelling, MD Inpatient   02/15/2017 0911 02/19/2017 1804 Full Code [id number]  Saundra Shelling, MD Inpatient   01/31/2017 0236 02/02/2017 2022 Full Code [id number]  Saundra Shelling, MD ED       TOTAL TIME TAKING CARE OF THIS PATIENT:  45 minutes.  Discussed with daughter with help of Spanish translator.  Vaughan Basta M.D on 09/29/2018   Between 7am to 6pm - Pager - [phone]  After 6pm go to [email or web address] - password [email or web address] [facility]  Office  [phone]  CC: Primary care physician; [name], MD   Note: This dictation was prepared with Dragon dictation along with smaller phrase technology. Any transcriptional errors that result from this process are unintentional.

## 2018-09-29 NOTE — ED Provider Notes (Signed)
[facility] Emergency Department Provider Note   ____________________________________________   First MD Initiated Contact with Patient 09/29/18 1259     (approximate)  I have reviewed the triage vital signs and the nursing notes.   HISTORY  Chief Complaint Back Pain        HPI Martin Gilbert is a 77 y.o. male reports 15 days of pain in his back.  Radiates down to his leg and the back part of the leg down to the knee.  It is very painful to walk.  Pain comes from the spine goes over into the left CVA area and down.  Nothing he does makes it better or worse although walking is very painful.  He can sit and lay without difficulty.  Patient saw a urgent care and got Robaxin and prednisone and Tylenol but that did not help.  He is not having any difficulty urinating or stooling.  Patient does get dialysis.  Pain is not present if he sitting or lying moderately severe when he is trying to walk.    Past Medical History:  Diagnosis Date  . Anemia   . Back pain   . BPH (benign prostatic hyperplasia)   . CKD (chronic kidney disease), stage IV ([place])   . Clostridium difficile colitis   . Diabetes mellitus without complication ([place])   . GERD (gastroesophageal reflux disease)   . HTN (hypertension)   . Hyperlipidemia     Patient Active Problem List   Diagnosis Date Noted  . Complication of arteriovenous dialysis fistula 09/01/2017  . ESRD on dialysis ([place]) 06/03/2017  . Colitis 03/27/2017  . Demand ischemia ([place]) 03/27/2017  . Enteritis due to Clostridium difficile   . Protein-calorie malnutrition, severe 02/18/2017  . C. difficile colitis 02/15/2017  . UTI (urinary tract infection) 02/02/2017  . E coli infection 02/02/2017  . Lactic acidosis 02/02/2017  . Diabetic neuropathy ([place]) 02/02/2017  . Left-sided chest wall pain 02/02/2017  . Fall 02/02/2017  . Generalized weakness 02/02/2017  . Pink eye, left 02/02/2017  . Leukocytosis 02/02/2017  .  Anemia of chronic disease 02/02/2017  . Nausea 02/02/2017  . Sepsis ([place]) 01/31/2017  . Essential hypertension 11/28/2015  . Pain in the chest 02/15/2015  . Neck pain 02/15/2015  . Diabetes type 2, uncontrolled ([place]) 02/15/2015  . Frequent headaches 02/15/2015    Past Surgical History:  Procedure Laterality Date  . A/V FISTULAGRAM Left 04/20/2018   Procedure: A/V FISTULAGRAM;  Surgeon: Algernon Huxley, MD;  Location: [place] [facility];  Service: Cardiovascular;  Laterality: Left;  . AV FISTULA PLACEMENT Left 06/13/2017   Procedure: ARTERIOVENOUS (AV) FISTULA CREATION ( RADIAL CEPHALIC );  Surgeon: Katha Cabal, MD;  Location: [facility];  Service: Vascular;  Laterality: Left;  . CATARACT EXTRACTION     one eye not sure which  . DIALYSIS/PERMA CATHETER INSERTION N/A 04/03/2017   Procedure: Dialysis/Perma Catheter Insertion;  Surgeon: Algernon Huxley, MD;  Location: [place] [facility];  Service: Cardiovascular;  Laterality: N/A;  . DIALYSIS/PERMA CATHETER INSERTION N/A 08/06/2017   Procedure: DIALYSIS/PERMA CATHETER INSERTION;  Surgeon: Algernon Huxley, MD;  Location: [place] [facility];  Service: Cardiovascular;  Laterality: N/A;  . DIALYSIS/PERMA CATHETER REMOVAL N/A 08/04/2017   Procedure: DIALYSIS/PERMA CATHETER REMOVAL;  Surgeon: Algernon Huxley, MD;  Location: [place] [facility];  Service: Cardiovascular;  Laterality: N/A;  . DIALYSIS/PERMA CATHETER REMOVAL N/A 12/31/2017   Procedure: DIALYSIS/PERMA CATHETER REMOVAL;  Surgeon: Algernon Huxley, MD;  Location: [place] [facility];  Service: Cardiovascular;  Laterality: N/A;  . hernia repair    . UPPER EXTREMITY ANGIOGRAPHY Left 09/05/2017   Procedure: Upper Extremity Angiography;  Surgeon: Katha Cabal, MD;  Location: [place] [facility];  Service: Cardiovascular;  Laterality: Left;    Prior to Admission medications   Medication Sig Start Date End Date Taking? Authorizing Provider  methocarbamol (ROBAXIN) 750 MG tablet Take 750 mg by  mouth 4 (four) times daily.   Yes [provider]  acetaminophen (TYLENOL) 325 MG tablet Take 2 tablets (650 mg total) by mouth every 6 (six) hours as needed for mild pain (or Fever >/= 101). 11/28/17   [name], MD  allopurinol (ZYLOPRIM) 100 MG tablet Take 100 mg by mouth daily. 10/17/15   [provider]  aspirin 81 MG tablet Take 81 mg by mouth daily.    [provider]  atorvastatin (LIPITOR) 20 MG tablet Take 20 mg by mouth daily.    [provider]  feeding supplement, GLUCERNA SHAKE, (GLUCERNA SHAKE) LIQD Take 237 mLs by mouth 3 (three) times daily between meals. Patient not taking: Reported on 11/25/2017 02/02/17   Theodoro Grist, MD  ferrous sulfate 325 (65 FE) MG tablet Take 325 mg by mouth 2 (two) times daily with a meal.  08/30/15   [provider]  glimepiride (AMARYL) 2 MG tablet Take 2 mg by mouth 2 (two) times daily.  04/24/17   [provider]  hydrALAZINE (APRESOLINE) 50 MG tablet Take 50 mg by mouth 3 (three) times daily.    [provider]  HYDROcodone-acetaminophen (NORCO) 5-325 MG tablet Take 1 tablet by mouth every 6 (six) hours as needed for moderate pain or severe pain. Patient not taking: Reported on 11/25/2017 08/06/17   Fritzi Mandes, MD  insulin glargine (LANTUS) 100 UNIT/ML injection Inject 0.12 mLs (12 Units total) into the skin daily. Patient not taking: Reported on 04/20/2018 11/29/17   [name], MD  insulin lispro (HUMALOG) 100 UNIT/ML injection Inject 0.03 mLs (3 Units total) into the skin 3 (three) times daily with meals. Patient not taking: Reported on 04/20/2018 11/28/17   [name], MD  lidocaine-prilocaine (EMLA) cream Apply 1 application topically daily as needed. 10/16/17   [provider]  Melatonin 5 MG TABS Take 1 tablet (5 mg total) by mouth at bedtime. Patient not taking: Reported on 11/25/2017 04/04/17   Gladstone Lighter, MD  metoprolol succinate (TOPROL-XL) 25 MG 24 hr tablet  Take 25 mg by mouth daily.    [provider]  multivitamin (RENA-VIT) TABS tablet Take 1 tablet by mouth at bedtime. 11/28/17   Gouru, Illene Silver, MD  ondansetron (ZOFRAN) 4 MG tablet Take 1 tablet (4 mg total) by mouth every 6 (six) hours as needed for nausea. Patient not taking: Reported on 11/25/2017 02/02/17   Theodoro Grist, MD  pantoprazole (PROTONIX) 40 MG tablet Take 1 tablet (40 mg total) by mouth daily. 02/03/17   Theodoro Grist, MD  Polyvinyl Alcohol-Povidone (REFRESH OP) Place 1 drop into both eyes 2 (two) times daily.    [provider]  predniSONE (STERAPRED UNI-PAK 21 TAB) 10 MG (21) TBPK tablet Take 1 tablet (10 mg total) by mouth daily. Take 6 tablets by mouth for 1 day followed by  5 tablets by mouth for 1 day followed by  4 tablets by mouth for 1 day followed by  3 tablets by mouth for 1 day followed by  2 tablets by mouth for 1  day followed by  1 tablet by mouth for a day and stop Patient not taking: Reported on 12/31/2017 11/28/17   [name], MD  sodium bicarbonate 650 MG tablet Take 650 mg by mouth 2 (two) times daily.    [provider]  tamsulosin (FLOMAX) 0.4 MG CAPS capsule Take 1 capsule (0.4 mg total) by mouth daily after supper. 02/19/17   Epifanio Lesches, MD  Vitamin D, Cholecalciferol, 1000 UNITS TABS Take 1 tablet by mouth daily.     [provider]    Allergies Patient has no known allergies.  Family History  Problem Relation Age of Onset  . Hypertension Mother   . Diabetes Neg Hx     Social History Social History   Tobacco Use  . Smoking status: Never Smoker  . Smokeless tobacco: Never Used  Substance Use Topics  . Alcohol use: No    Comment: Drank in the past, but has stopped for several years.   . Drug use: No    Review of Systems  Constitutional: No fever/chills Eyes: No visual changes. ENT: No sore throat. Cardiovascular: Denies chest pain. Respiratory: Denies shortness of breath. Gastrointestinal:  No abdominal pain.  No nausea, no vomiting.  No diarrhea.  No constipation. Genitourinary: Negative for dysuria. Musculoskeletal:back pain. Skin: Negative for rash. Neurological: Negative for headaches  ____________________________________________   PHYSICAL EXAM:  VITAL SIGNS: ED Triage Vitals [09/29/18 1118]  Enc Vitals Group     BP (!) 141/61     Pulse Rate 73     Resp 16     Temp 98.3 F (36.8 C)     Temp Source Oral     SpO2 96 %     Weight 165 lb (74.8 kg)     Height 5\' 11"  (1.803 m)     Head Circumference      Peak Flow      Pain Score 10     Pain Loc      Pain Edu?      Excl. in [place]?     Constitutional: Alert and oriented.  Looks uncomfortable when moving his leg Eyes: Conjunctivae are normal. . Head: Atraumatic. Nose: No congestion/rhinnorhea. Mouth/Throat: Mucous membranes are moist.  Oropharynx non-erythematous. Neck: No stridor.  Cardiovascular: Normal rate, regular rhythm. Grossly normal heart sounds.  Good peripheral circulation. Respiratory: Normal respiratory effort.  No retractions. Lungs CTAB. Gastrointestinal: Soft and nontender. No distention. No abdominal bruits. No CVA tenderness. Musculoskeletal: No lower extremity tenderness nor edema.  Back is tender as described in HPI Neurologic:  Normal speech and language. No gross focal neurologic deficits are appreciated. No gait instability. Skin:  Skin is warm, dry and intact. No rash noted. Psychiatric: Mood and affect are normal. Speech and behavior are normal.  ____________________________________________   LABS (all labs ordered are listed, but only abnormal results are displayed)  Labs Reviewed  COMPREHENSIVE METABOLIC PANEL - Abnormal; Notable for the following components:      Result Value   Potassium 2.9 (*)    Chloride 97 (*)    Glucose, Bld 176 (*)    Creatinine, Ser 3.90 (*)    Calcium 8.2 (*)    Albumin 3.2 (*)    Alkaline Phosphatase 269 (*)    GFR calc non Af Amer 13 (*)    GFR  calc Af Amer 16 (*)    All other components within normal limits  CBC WITH DIFFERENTIAL/PLATELET - Abnormal; Notable for the following components:   RBC 3.28 (*)  Hemoglobin 10.5 (*)    HCT 31.3 (*)    All other components within normal limits  SEDIMENTATION RATE - Abnormal; Notable for the following components:   Sed Rate 66 (*)    All other components within normal limits  URINALYSIS, COMPLETE (UACMP) WITH MICROSCOPIC - Abnormal; Notable for the following components:   Color, Urine YELLOW (*)    APPearance CLEAR (*)    Glucose, UA 50 (*)    Protein, ur 100 (*)    Leukocytes, UA TRACE (*)    Bacteria, UA RARE (*)    All other components within normal limits   ____________________________________________  EKG  EKG read interpreted by me shows normal sinus rhythm rate of 64 left axis right bundle branch block computer also reads left anterior hemiblock no acute ST-T wave changes are noted ____________________________________________  RADIOLOGY  ED MD interpretation:    Official radiology report(s): Ct Abdomen Pelvis Wo Contrast  Result Date: 09/29/2018 CLINICAL DATA:  Back pain and weakness EXAM: CT ABDOMEN AND PELVIS WITHOUT CONTRAST TECHNIQUE: Multidetector CT imaging of the abdomen and pelvis was performed following the standard protocol without IV contrast. COMPARISON:  03/29/2017 FINDINGS: Lower chest: Minimal scarring is noted in the left lower lobe. No focal infiltrate or sizable parenchymal nodule is seen. Hepatobiliary: Mild nodularity of the liver is again identified suspicious for cirrhosis. No gallstones, gallbladder wall thickening, or biliary dilatation. Pancreas: Unremarkable. No pancreatic ductal dilatation or surrounding inflammatory changes. Spleen: Within normal limits. Adrenals/Urinary Tract: Adrenal glands are within normal limits. Kidneys demonstrate cortical thinning but stable from the prior exam. A cystic lesion is noted in the upper pole of the left  kidney. No calculi or obstructive changes are noted. Mild renal vascular calcifications are seen. The bladder is partially distended. Stomach/Bowel: Stomach is within normal limits. Appendix is not well seen although no inflammatory changes to suggest appendicitis are noted. No evidence of bowel wall thickening, distention, or inflammatory changes. Vascular/Lymphatic: Aortic atherosclerosis. No enlarged abdominal or pelvic lymph nodes. Reproductive: Prostate is unremarkable. Other: No abdominal wall hernia or abnormality. No abdominopelvic ascites. Musculoskeletal: Degenerative changes of lumbar spine are seen. No compression deformities are noted. No significant disc pathology is noted. IMPRESSION: Chronic changes as described above without acute abnormality. Electronically Signed   By: Inez Catalina M.D.   On: 09/29/2018 14:23   Mr Lumbar Spine Wo Contrast  Result Date: 09/29/2018 CLINICAL DATA:  Initial evaluation for acute back pain radiating into the left lower extremity for 3 weeks, decreased mobility. EXAM: MRI LUMBAR SPINE WITHOUT CONTRAST TECHNIQUE: Multiplanar, multisequence MR imaging of the lumbar spine was performed. No intravenous contrast was administered. COMPARISON:  Prior CT from earlier same day as well as previous MRI from 02/01/2017. FINDINGS: Segmentation: Normal segmentation. Lowest well-formed disc labeled the L5-S1 level. Alignment: Trace 2 mm retrolisthesis of L1 on L2. Mild dextroscoliosis. Vertebral bodies otherwise normally aligned with preservation of the normal lumbar lordosis. Vertebrae: Abnormal marrow edema within the bilateral sacral ala, consistent with acute sacral insufficiency type fractures, primarily involving the S2 segment. Vertebral body heights maintained with no other acute or chronic fracture identified. Underlying bone marrow signal intensity mildly heterogeneous but within normal limits. No worrisome osseous lesions. No other abnormal marrow edema. Conus  medullaris and cauda equina: Conus extends to the L1 level. Conus and cauda equina appear normal. Paraspinal and other soft tissues: Confluent edema within the lower posterior paraspinous musculature as well as the presacral soft tissues, likely reactive in nature due to sacral  insufficiency type fractures. A degree of associated muscular strain is suspected. Paraspinous soft tissues otherwise within normal limits. Visualized kidneys are atrophic, consistent with history of end-stage renal disease. Single subcentimeter T2 hyperintense simple cyst noted within the left kidney. Disc levels: T12-L1: Unremarkable. L1-2: Trace retrolisthesis. Diffuse disc bulge with disc desiccation. No significant canal or foraminal stenosis. L2-3: Mild annular disc bulge. Small annular fissure at the level of the left lateral recess/foramen. Mild bilateral facet and ligament flavum hypertrophy. No significant canal or neural foraminal stenosis. No impingement. L3-4: Diffuse disc bulge with disc desiccation. Mild facet hypertrophy. Resultant mild left lateral recess narrowing without significant canal stenosis. Mild bilateral L3 foraminal narrowing, right greater than left. L4-5: Diffuse disc bulge with disc desiccation and mild intervertebral disc space narrowing. Mild reactive endplate changes with marginal endplate osteophytic spurring. Moderate facet and ligament flavum hypertrophy. Left paracentral annular fissure. Mild to moderate canal with left greater than right lateral recess stenosis, relatively unchanged from previous. Mild bilateral L4 foraminal narrowing. L5-S1: Mild disc bulge with disc desiccation. Right paracentral disc protrusion. Mild epidural lipomatosis, partially effacing the distal thecal sac. No other significant canal or lateral recess stenosis. Foramina remain patent. IMPRESSION: 1. Acute sacral insufficiency type fractures involving the bilateral sacral ala, presumably the source of patient's acute lower back  pain. 2. Confluent edema within the lower posterior paraspinous musculature, likely reflecting a degree of associated muscular strain/injury. 3. 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 Electronically Signed   By: Jeannine Boga M.D.   On: 09/29/2018 17:03    ____________________________________________   PROCEDURES  Procedure(s) performed:   Procedures  Critical Care performed: Critical care time 45 minutes.  This includes examining patient discussing things with the patient and his family at least 3 times discussing the patient with Dr. Wendall Mola and the hospitalist and reviewing her films.  ____________________________________________   INITIAL IMPRESSION / ASSESSMENT AND PLAN / ED COURSE   Discussed patient with Dr. Wendall Mola orthopedics.  He says there are 2 possible options one is to put the patient in the hospital for pain control and have Dr. Rudene Christians [facility] if he can do kyphoplasty on this which is possible or send him home and let him follow-up with Dr. Rudene Christians in clinic for the same reason.  Patient his family pick option #1 as he can barely get around at home because of the pain getting worse.        ____________________________________________   FINAL CLINICAL IMPRESSION(S) / ED DIAGNOSES  Final diagnoses:  Closed fracture of sacrum, unspecified fracture morphology, initial encounter [facility])     ED Discharge Orders    None       Note:  This document was prepared using Dragon voice recognition software and may include unintentional dictation errors.    Nena Polio, MD 09/29/18 [phone]

## 2018-09-29 NOTE — ED Notes (Signed)
Pt in mri 

## 2018-09-29 NOTE — ED Notes (Addendum)
See triage note  Per family developed lower back pain which moved into left leg  Pain started 3 weeks ago denies any injury  Pt was seen by PCP and given robaxin,prednisone and tylenol  States pain is not any better  Denies any fever  Pt moved to major per PA for further work up  Report given to [facility]

## 2018-09-29 NOTE — ED Notes (Signed)
Dr. Cinda Quest and interpreter Hiram at bedside.  Explained pt has stress fx at sacrum. Pt can go home with walker and walk (but not much), following up with orthopedic surgeon or stay in hospital and meet with orthopedics here. Pt chooses to stay here.  Dr. Cinda Quest will work on this for pt. Family understanding

## 2018-09-29 NOTE — ED Notes (Signed)
Called floor to give report. RN states she is not ready. This RN will call back in 5 minutes

## 2018-09-29 NOTE — Progress Notes (Signed)
MRI reviewed, S1 and S2 fractures visible on MRI.  Will hold anticoagulation and discuss sacroplasty in am.

## 2018-09-29 NOTE — ED Triage Notes (Addendum)
PT to ED with daughter, states pt c/o back pain radiating into LLE x3 wks. PT has had decreased mobility. PT is a dialysis pt , dialyzed this am, tues-thurs-sat pt. Family states was seen by PCP and given pain meds with no relief. VSS . Language barrier

## 2018-09-30 ENCOUNTER — Observation Stay: Payer: Medicare Other | Admitting: Anesthesiology

## 2018-09-30 ENCOUNTER — Encounter: Payer: Self-pay | Admitting: *Deleted

## 2018-09-30 ENCOUNTER — Observation Stay: Payer: Medicare Other

## 2018-09-30 ENCOUNTER — Encounter
Admission: EM | Disposition: A | Discharge status: Home / Self Care | Payer: Self-pay | Source: Home / Self Care | Attending: Emergency Medicine

## 2018-09-30 DIAGNOSIS — M4848XA Fatigue fracture of vertebra, sacral and sacrococcygeal region, initial encounter for fracture: Secondary | ICD-10-CM | POA: Diagnosis not present

## 2018-09-30 HISTORY — PX: SACROPLASTY: SHX6797

## 2018-09-30 LAB — CBC
HEMATOCRIT: 30.9 % — AB (ref 39.0–52.0)
Hemoglobin: 10.3 g/dL — ABNORMAL LOW (ref 13.0–17.0)
MCH: 32.2 pg (ref 26.0–34.0)
MCHC: 33.3 g/dL (ref 30.0–36.0)
MCV: 96.6 fL (ref 80.0–100.0)
NRBC: 0 % (ref 0.0–0.2)
PLATELETS: 209 10*3/uL (ref 150–400)
RBC: 3.2 MIL/uL — AB (ref 4.22–5.81)
RDW: 13.1 % (ref 11.5–15.5)
WBC: 4.8 10*3/uL (ref 4.0–10.5)

## 2018-09-30 LAB — BASIC METABOLIC PANEL
ANION GAP: 8 (ref 5–15)
BUN: 25 mg/dL — ABNORMAL HIGH (ref 8–23)
CALCIUM: 8.3 mg/dL — AB (ref 8.9–10.3)
CHLORIDE: 101 mmol/L (ref 98–111)
CO2: 29 mmol/L (ref 22–32)
Creatinine, Ser: 5.05 mg/dL — ABNORMAL HIGH (ref 0.61–1.24)
GFR calc non Af Amer: 10 mL/min — ABNORMAL LOW (ref >60)
GFR, EST AFRICAN AMERICAN: 11 mL/min — AB (ref >60)
Glucose, Bld: 51 mg/dL — ABNORMAL LOW (ref 70–99)
POTASSIUM: 2.9 mmol/L — AB (ref 3.5–5.1)
Sodium: 138 mmol/L (ref 135–145)

## 2018-09-30 LAB — GLUCOSE, CAPILLARY
GLUCOSE-CAPILLARY: 56 mg/dL — AB (ref 70–99)
GLUCOSE-CAPILLARY: 126 mg/dL — AB (ref 70–99)
Glucose-Capillary: 123 mg/dL — ABNORMAL HIGH (ref 70–99)
Glucose-Capillary: 105 mg/dL — ABNORMAL HIGH (ref 70–99)
Glucose-Capillary: 47 mg/dL — ABNORMAL LOW (ref 70–99)
Glucose-Capillary: 66 mg/dL — ABNORMAL LOW (ref 70–99)
Glucose-Capillary: 101 mg/dL — ABNORMAL HIGH (ref 70–99)
Glucose-Capillary: 131 mg/dL — ABNORMAL HIGH (ref 70–99)

## 2018-09-30 LAB — POCT I-STAT 4, (NA,K, GLUC, HGB,HCT)
GLUCOSE: 69 mg/dL — AB (ref 70–99)
HEMATOCRIT: 31 % — AB (ref 39.0–52.0)
HEMOGLOBIN: 10.5 g/dL — AB (ref 13.0–17.0)
Potassium: 3.7 mmol/L (ref 3.5–5.1)
SODIUM: 138 mmol/L (ref 135–145)

## 2018-09-30 SURGERY — SACROPLASTY
Anesthesia: General

## 2018-09-30 MED ORDER — CEFAZOLIN SODIUM-DEXTROSE 1-4 GM/50ML-% IV SOLN
INTRAVENOUS | Status: AC
  Filled 2018-09-30: qty 50

## 2018-09-30 MED ORDER — ONDANSETRON HCL 4 MG/2ML IJ SOLN
INTRAMUSCULAR | Status: AC
  Filled 2018-09-30: qty 2

## 2018-09-30 MED ORDER — PROPOFOL 500 MG/50ML IV EMUL
INTRAVENOUS | Status: AC
  Filled 2018-09-30: qty 50

## 2018-09-30 MED ORDER — POTASSIUM CHLORIDE CRYS ER 20 MEQ PO TBCR
40.0000 meq | EXTENDED_RELEASE_TABLET | Freq: Two times a day (BID) | ORAL | Status: AC
  Administered 2018-09-30 (×2): 40 meq via ORAL
  Filled 2018-09-30 (×2): qty 2

## 2018-09-30 MED ORDER — LIDOCAINE HCL (PF) 1 % IJ SOLN
INTRAMUSCULAR | Status: DC | PRN
  Administered 2018-09-30: 20 mL

## 2018-09-30 MED ORDER — METOCLOPRAMIDE HCL 10 MG PO TABS: 5.0000 mg | ORAL_TABLET | Freq: Three times a day (TID) | ORAL | Status: DC | PRN

## 2018-09-30 MED ORDER — PROPOFOL 500 MG/50ML IV EMUL
INTRAVENOUS | Status: DC | PRN
  Administered 2018-09-30: 30 ug/kg/min via INTRAVENOUS

## 2018-09-30 MED ORDER — PROMETHAZINE HCL 25 MG/ML IJ SOLN: 6.2500 mg | INTRAMUSCULAR | Status: DC | PRN

## 2018-09-30 MED ORDER — DEXTROSE 50 % IV SOLN
INTRAVENOUS | Status: AC
  Administered 2018-09-30: 50 mL via INTRAVENOUS
  Filled 2018-09-30: qty 50

## 2018-09-30 MED ORDER — DEXTROSE 50 % IV SOLN
50.0000 mL | Freq: Once | INTRAVENOUS | Status: AC
  Administered 2018-09-30: 50 mL via INTRAVENOUS
  Administered 2018-09-30: 13:00:00 via INTRAVENOUS

## 2018-09-30 MED ORDER — DOCUSATE SODIUM 100 MG PO CAPS
100.0000 mg | ORAL_CAPSULE | Freq: Two times a day (BID) | ORAL | Status: DC
  Administered 2018-09-30 – 2018-10-01 (×2): 100 mg via ORAL
  Filled 2018-09-30 (×2): qty 1

## 2018-09-30 MED ORDER — CEFAZOLIN SODIUM-DEXTROSE 1-4 GM/50ML-% IV SOLN
1.0000 g | INTRAVENOUS | Status: AC
  Administered 2018-09-30: 1 g via INTRAVENOUS
  Filled 2018-09-30: qty 50

## 2018-09-30 MED ORDER — LIDOCAINE HCL (CARDIAC) PF 100 MG/5ML IV SOSY
PREFILLED_SYRINGE | INTRAVENOUS | Status: DC | PRN
  Administered 2018-09-30: 60 mg via INTRAVENOUS

## 2018-09-30 MED ORDER — DEXTROSE 50 % IV SOLN
INTRAVENOUS | Status: AC
  Filled 2018-09-30: qty 50

## 2018-09-30 MED ORDER — FENTANYL CITRATE (PF) 100 MCG/2ML IJ SOLN
INTRAMUSCULAR | Status: AC
  Filled 2018-09-30: qty 2

## 2018-09-30 MED ORDER — FENTANYL CITRATE (PF) 100 MCG/2ML IJ SOLN
INTRAMUSCULAR | Status: DC | PRN
  Administered 2018-09-30 (×4): 25 ug via INTRAVENOUS

## 2018-09-30 MED ORDER — CEFAZOLIN (ANCEF) 1 G IV SOLR: 1.0000 g | INTRAVENOUS | Status: DC

## 2018-09-30 MED ORDER — METOCLOPRAMIDE HCL 5 MG/ML IJ SOLN: 5.0000 mg | Freq: Three times a day (TID) | INTRAMUSCULAR | Status: DC | PRN

## 2018-09-30 MED ORDER — MEPERIDINE HCL 50 MG/ML IJ SOLN: 6.2500 mg | INTRAMUSCULAR | Status: DC | PRN

## 2018-09-30 MED ORDER — OXYCODONE HCL 5 MG PO TABS: 5.0000 mg | ORAL_TABLET | Freq: Once | ORAL | Status: DC | PRN

## 2018-09-30 MED ORDER — OXYCODONE HCL 5 MG/5ML PO SOLN: 5.0000 mg | Freq: Once | ORAL | Status: DC | PRN

## 2018-09-30 MED ORDER — FENTANYL CITRATE (PF) 100 MCG/2ML IJ SOLN: 25.0000 ug | INTRAMUSCULAR | Status: DC | PRN

## 2018-09-30 MED ORDER — ONDANSETRON HCL 4 MG/2ML IJ SOLN: 4.0000 mg | Freq: Four times a day (QID) | INTRAMUSCULAR | Status: DC | PRN

## 2018-09-30 MED ORDER — DEXTROSE 50 % IV SOLN
1.0000 | Freq: Once | INTRAVENOUS | Status: AC
  Administered 2018-09-30: 50 mL via INTRAVENOUS
  Filled 2018-09-30: qty 50

## 2018-09-30 MED ORDER — BUPIVACAINE-EPINEPHRINE (PF) 0.5% -1:200000 IJ SOLN
INTRAMUSCULAR | Status: AC
  Filled 2018-09-30: qty 30

## 2018-09-30 MED ORDER — SODIUM CHLORIDE 0.9 % IV SOLN
INTRAVENOUS | Status: DC | PRN
  Administered 2018-09-30: 15:00:00 via INTRAVENOUS

## 2018-09-30 MED ORDER — BUPIVACAINE-EPINEPHRINE (PF) 0.5% -1:200000 IJ SOLN
INTRAMUSCULAR | Status: DC | PRN
  Administered 2018-09-30: 10 mL via PERINEURAL

## 2018-09-30 MED ORDER — ONDANSETRON HCL 4 MG PO TABS: 4.0000 mg | ORAL_TABLET | Freq: Four times a day (QID) | ORAL | Status: DC | PRN

## 2018-09-30 MED ORDER — LIDOCAINE HCL (PF) 2 % IJ SOLN
INTRAMUSCULAR | Status: AC
  Filled 2018-09-30: qty 10

## 2018-09-30 MED ORDER — LIDOCAINE HCL (PF) 1 % IJ SOLN
INTRAMUSCULAR | Status: AC
  Filled 2018-09-30: qty 30

## 2018-09-30 SURGICAL SUPPLY — 20 items
BIT DRILL KYPHON EXPRESS (MISCELLANEOUS) ×2 IMPLANT
CEMENT KYPHON CX01A KIT/MIXER (Cement) ×3 IMPLANT
COVER WAND RF STERILE (DRAPES) ×3 IMPLANT
DERMABOND ADVANCED (GAUZE/BANDAGES/DRESSINGS) ×2
DERMABOND ADVANCED .7 DNX12 (GAUZE/BANDAGES/DRESSINGS) ×1 IMPLANT
DEVICE BIOPSY BONE KYPH (INSTRUMENTS) ×18 IMPLANT
DRAPE C-ARM XRAY 36X54 (DRAPES) ×3 IMPLANT
DRILL KYPHON EXPRESS (MISCELLANEOUS) ×6
DURAPREP 26ML APPLICATOR (WOUND CARE) ×3 IMPLANT
GLOVE SURG SYN 9.0  PF PI (GLOVE) ×2
GLOVE SURG SYN 9.0 PF PI (GLOVE) ×1 IMPLANT
GOWN SRG 2XL LVL 4 RGLN SLV (GOWNS) ×1 IMPLANT
GOWN STRL NON-REIN 2XL LVL4 (GOWNS) ×2
GOWN STRL REUS W/ TWL LRG LVL3 (GOWN DISPOSABLE) ×1 IMPLANT
GOWN STRL REUS W/TWL LRG LVL3 (GOWN DISPOSABLE) ×2
KIT TURNOVER KIT A (KITS) ×3 IMPLANT
PACK KYPHOPLASTY (MISCELLANEOUS) ×3 IMPLANT
SYS CARTRIDGE BONE CEMENT 8ML (SYSTAGENIX WOUND MANAGEMENT) ×3
SYSTEM CARTRIDG BONE CEMNT 8ML (SYSTAGENIX WOUND MANAGEMENT) ×1 IMPLANT
SYSTEM GUN BONE FILLER SZ2 (MISCELLANEOUS) ×3 IMPLANT

## 2018-09-30 NOTE — Evaluation (Signed)
Physical Therapy Evaluation Patient Details Name: Martin Gilbert MRN: [id number] DOB: 10-28-1941 Today's Date: 09/30/2018   History of Present Illness  Pt admitted for acute sacral insufficiency fx and is now s/p B sacroplasty. History includes ESRD, DM, HTN, and HLD. Pt with complaints of pain in back affecting ambulation and has elected for surgery this date. Pt is POD 0 at time of evaluation.   Clinical Impression  Pt is a pleasant 77 year old male who was admitted for acute sacral insufficiency fx and now s/p sacroplasty. Pt performs bed mobility, transfers, and ambulation with min assist and is very unsteady; high risk for falls. Pt fatigues very quickly and request seated rest break halfway during ambulation. Pt demonstrates deficits with strength/mobility/safety. Not currently safe to dc home this date. Eval performed with Spanish interpreter in room. Would benefit from skilled PT to address above deficits and promote optimal return to PLOF. Recommend transition to [place] upon discharge from acute hospitalization.       Follow Up Recommendations Home health PT;Supervision/Assistance - 24 hour    Equipment Recommendations  None recommended by PT    Recommendations for Other Services       Precautions / Restrictions Precautions Precautions: Fall Restrictions Weight Bearing Restrictions: Yes RLE Weight Bearing: Weight bearing as tolerated LLE Weight Bearing: Weight bearing as tolerated      Mobility  Bed Mobility Overal bed mobility: Needs Assistance Bed Mobility: Supine to Sit     Supine to sit: Min assist     General bed mobility comments: needs cues as pt slightly groggy from anesthesia. Slight assist needed. Once seated at EOB, able to sit with cga.  Transfers Overall transfer level: Needs assistance Equipment used: Rolling walker (2 wheeled) Transfers: Sit to/from Stand Sit to Stand: Min assist         General transfer comment: Pt tries to pull on RW  to stand despite cues. Once standing, initially shaky however improved balance with cues.   Ambulation/Gait Ambulation/Gait assistance: Min assist Gait Distance (Feet): 40 Feet Assistive device: Rolling walker (2 wheeled) Gait Pattern/deviations: Step-through pattern     General Gait Details: heavy cues needed and pt fatigues quickly needing seated rest break halfway. RW used for all mobility however stays too far away and has short step length.   Stairs            Wheelchair Mobility    Modified Rankin (Stroke Patients Only)       Balance Overall balance assessment: Needs assistance Sitting-balance support: Feet supported Sitting balance-Leahy Scale: Fair     Standing balance support: Bilateral upper extremity supported Standing balance-Leahy Scale: Fair                               Pertinent Vitals/Pain Pain Assessment: No/denies pain    Home Living Family/patient expects to be discharged to:: Private residence Living Arrangements: Spouse/significant other Available Help at Discharge: Family Type of Home: Mobile home Home Access: Ramped entrance     Home Layout: One level Home Equipment: Environmental consultant - 2 wheels;Cane - single point;Shower seat      Prior Function Level of Independence: Independent with assistive device(s)         Comments: reports he hasn't had any falls in past 6 months and has primarily been using the [facility] for mobility. Mainly ambulates home distances     Hand Dominance        Extremity/Trunk Assessment  Upper Extremity Assessment Upper Extremity Assessment: Overall WFL for tasks assessed    Lower Extremity Assessment Lower Extremity Assessment: Generalized weakness(B LE grossly 3+/5)       Communication   Communication: Interpreter utilized  Cognition Arousal/Alertness: Awake/alert Behavior During Therapy: WFL for tasks assessed/performed Overall Cognitive Status: Within Functional Limits for tasks assessed                                         General Comments      Exercises     Assessment/Plan    PT Assessment Patient needs continued PT services  PT Problem List Decreased strength;Decreased balance;Decreased mobility;Decreased knowledge of use of DME;Decreased safety awareness;Decreased activity tolerance       PT Treatment Interventions Gait training;Therapeutic exercise;Balance training;DME instruction    PT Goals (Current goals can be found in the Care Plan section)  Acute Rehab PT Goals Patient Stated Goal: to go home PT Goal Formulation: With patient Time For Goal Achievement: 10/14/18 Potential to Achieve Goals: Good    Frequency 7X/week   Barriers to discharge        Co-evaluation               AM-PAC PT "6 Clicks" Daily Activity  Outcome Measure Difficulty turning over in bed (including adjusting bedclothes, sheets and blankets)?: Unable Difficulty moving from lying on back to sitting on the side of the bed? : Unable Difficulty sitting down on and standing up from a chair with arms (e.g., wheelchair, bedside commode, etc,.)?: Unable Help needed moving to and from a bed to chair (including a wheelchair)?: A Little Help needed walking in hospital room?: A Little Help needed climbing 3-5 steps with a railing? : A Lot 6 Click Score: 11    End of Session Equipment Utilized During Treatment: Gait belt Activity Tolerance: Patient limited by fatigue Patient left: in bed;with bed alarm set;with SCD's reapplied;with family/visitor present Nurse Communication: Mobility status PT Visit Diagnosis: Muscle weakness (generalized) (M62.81);Difficulty in walking, not elsewhere classified (R26.2);Unsteadiness on feet (R26.81)    Time: 6010-9323 PT Time Calculation (min) (ACUTE ONLY): 27 min   Charges:   PT Evaluation $PT Eval Low Complexity: 1 Low          Greggory Stallion, PT, DPT [phone]   Martin Gilbert 09/30/2018, 5:37 PM

## 2018-09-30 NOTE — Consult Note (Addendum)
ORTHOPAEDIC CONSULTATION  REQUESTING PHYSICIAN: Fritzi Mandes, MD  Chief Complaint:   Left buttock and posterior thigh pain  History of Present Illness: Martin Gilbert is a 77 y.o. male with a history of diabetes, hypertension, hyperlipidemia, gastroesophageal reflux disease, and end-stage kidney disease who presented to the emergency room yesterday with a 2-week history of left-sided lower back pain radiating into the left buttock and posterior thigh.  The patient denies any fall that may have precipitated his symptoms.  He notes that the symptoms have been present for 2 weeks.  Initially they were in the right lower back region but now have progressed to the left lower back region.  He did go to an outside clinic where he was given some medication but he did not feel this provided any benefit, so he presented to the emergency room.  He has pain and difficulty with ambulation.  Past Medical History:  Diagnosis Date  . Anemia   . Back pain   . BPH (benign prostatic hyperplasia)   . CKD (chronic kidney disease), stage IV ([place])   . Clostridium difficile colitis   . Diabetes mellitus without complication ([place])   . GERD (gastroesophageal reflux disease)   . HTN (hypertension)   . Hyperlipidemia    Past Surgical History:  Procedure Laterality Date  . A/V FISTULAGRAM Left 04/20/2018   Procedure: A/V FISTULAGRAM;  Surgeon: Algernon Huxley, MD;  Location: [facility];  Service: Cardiovascular;  Laterality: Left;  . AV FISTULA PLACEMENT Left 06/13/2017   Procedure: ARTERIOVENOUS (AV) FISTULA CREATION ( RADIAL CEPHALIC );  Surgeon: Katha Cabal, MD;  Location: [facility];  Service: Vascular;  Laterality: Left;  . CATARACT EXTRACTION     one eye not sure which  . DIALYSIS/PERMA CATHETER INSERTION N/A 04/03/2017   Procedure: Dialysis/Perma Catheter Insertion;  Surgeon: Algernon Huxley, MD;  Location: [facility];   Service: Cardiovascular;  Laterality: N/A;  . DIALYSIS/PERMA CATHETER INSERTION N/A 08/06/2017   Procedure: DIALYSIS/PERMA CATHETER INSERTION;  Surgeon: Algernon Huxley, MD;  Location: [facility];  Service: Cardiovascular;  Laterality: N/A;  . DIALYSIS/PERMA CATHETER REMOVAL N/A 08/04/2017   Procedure: DIALYSIS/PERMA CATHETER REMOVAL;  Surgeon: Algernon Huxley, MD;  Location: [facility];  Service: Cardiovascular;  Laterality: N/A;  . DIALYSIS/PERMA CATHETER REMOVAL N/A 12/31/2017   Procedure: DIALYSIS/PERMA CATHETER REMOVAL;  Surgeon: Algernon Huxley, MD;  Location: [facility];  Service: Cardiovascular;  Laterality: N/A;  . hernia repair    . UPPER EXTREMITY ANGIOGRAPHY Left 09/05/2017   Procedure: Upper Extremity Angiography;  Surgeon: Katha Cabal, MD;  Location: [facility];  Service: Cardiovascular;  Laterality: Left;   Social History   Socioeconomic History  . Marital status: Married    Spouse name: Not on file  . Number of children: Not on file  . Years of education: Not on file  . Highest education level: Not on file  Occupational History  . Occupation: retired  Scientific laboratory technician  . Financial resource strain: Not on file  . Food insecurity:    Worry: Not on file    Inability: Not on file  . Transportation needs:    Medical: Not on file    Non-medical: Not on file  Tobacco Use  . Smoking status: Never Smoker  . Smokeless tobacco: Never Used  Substance and Sexual Activity  . Alcohol use: No    Comment: Drank in the past, but has stopped for several years.   Marland Kitchen  Drug use: No  . Sexual activity: Not on file  Lifestyle  . Physical activity:    Days per week: Not on file    Minutes per session: Not on file  . Stress: Not on file  Relationships  . Social connections:    Talks on phone: Not on file    Gets together: Not on file    Attends religious service: Not on file    Active member of club or organization: Not on file    Attends meetings of  clubs or organizations: Not on file    Relationship status: Not on file  Other Topics Concern  . Not on file  Social History Narrative  . Not on file   Family History  Problem Relation Age of Onset  . Hypertension Mother   . Diabetes Neg Hx    No Known Allergies Prior to Admission medications   Medication Sig Start Date End Date Taking? Authorizing Provider  methocarbamol (ROBAXIN) 750 MG tablet Take 750 mg by mouth 4 (four) times daily.   Yes [provider]  acetaminophen (TYLENOL) 325 MG tablet Take 2 tablets (650 mg total) by mouth every 6 (six) hours as needed for mild pain (or Fever >/= 101). 11/28/17   [name], MD  allopurinol (ZYLOPRIM) 100 MG tablet Take 100 mg by mouth daily. 10/17/15   [provider]  aspirin 81 MG tablet Take 81 mg by mouth daily.    [provider]  atorvastatin (LIPITOR) 20 MG tablet Take 20 mg by mouth daily.    [provider]  feeding supplement, GLUCERNA SHAKE, (GLUCERNA SHAKE) LIQD Take 237 mLs by mouth 3 (three) times daily between meals. Patient not taking: Reported on 11/25/2017 02/02/17   Theodoro Grist, MD  ferrous sulfate 325 (65 FE) MG tablet Take 325 mg by mouth 2 (two) times daily with a meal.  08/30/15   [provider]  glimepiride (AMARYL) 2 MG tablet Take 2 mg by mouth 2 (two) times daily.  04/24/17   [provider]  hydrALAZINE (APRESOLINE) 50 MG tablet Take 50 mg by mouth 3 (three) times daily.    [provider]  HYDROcodone-acetaminophen (NORCO) 5-325 MG tablet Take 1 tablet by mouth every 6 (six) hours as needed for moderate pain or severe pain. Patient not taking: Reported on 11/25/2017 08/06/17   Fritzi Mandes, MD  insulin glargine (LANTUS) 100 UNIT/ML injection Inject 0.12 mLs (12 Units total) into the skin daily. Patient not taking: Reported on 04/20/2018 11/29/17   [name], MD  insulin lispro (HUMALOG) 100 UNIT/ML injection Inject 0.03 mLs (3 Units total) into the  skin 3 (three) times daily with meals. Patient not taking: Reported on 04/20/2018 11/28/17   [name], MD  lidocaine-prilocaine (EMLA) cream Apply 1 application topically daily as needed. 10/16/17   [provider]  Melatonin 5 MG TABS Take 1 tablet (5 mg total) by mouth at bedtime. Patient not taking: Reported on 11/25/2017 04/04/17   Gladstone Lighter, MD  metoprolol succinate (TOPROL-XL) 25 MG 24 hr tablet Take 25 mg by mouth daily.    [provider]  multivitamin (RENA-VIT) TABS tablet Take 1 tablet by mouth at bedtime. 11/28/17   Gouru, Illene Silver, MD  ondansetron (ZOFRAN) 4 MG tablet Take 1 tablet (4 mg total) by mouth every 6 (six) hours as needed for nausea. Patient not taking: Reported on 11/25/2017 02/02/17   Theodoro Grist, MD  pantoprazole (PROTONIX) 40 MG tablet Take 1 tablet (40 mg total)  by mouth daily. 02/03/17   Theodoro Grist, MD  Polyvinyl Alcohol-Povidone (REFRESH OP) Place 1 drop into both eyes 2 (two) times daily.    [provider]  predniSONE (STERAPRED UNI-PAK 21 TAB) 10 MG (21) TBPK tablet Take 1 tablet (10 mg total) by mouth daily. Take 6 tablets by mouth for 1 day followed by  5 tablets by mouth for 1 day followed by  4 tablets by mouth for 1 day followed by  3 tablets by mouth for 1 day followed by  2 tablets by mouth for 1 day followed by  1 tablet by mouth for a day and stop Patient not taking: Reported on 12/31/2017 11/28/17   [name], MD  sodium bicarbonate 650 MG tablet Take 650 mg by mouth 2 (two) times daily.    [provider]  tamsulosin (FLOMAX) 0.4 MG CAPS capsule Take 1 capsule (0.4 mg total) by mouth daily after supper. 02/19/17   Epifanio Lesches, MD  Vitamin D, Cholecalciferol, 1000 UNITS TABS Take 1 tablet by mouth daily.     [provider]   Ct Abdomen Pelvis Wo Contrast  Result Date: 09/29/2018 CLINICAL DATA:  Back pain and weakness EXAM: CT ABDOMEN AND PELVIS WITHOUT CONTRAST TECHNIQUE:  Multidetector CT imaging of the abdomen and pelvis was performed following the standard protocol without IV contrast. COMPARISON:  03/29/2017 FINDINGS: Lower chest: Minimal scarring is noted in the left lower lobe. No focal infiltrate or sizable parenchymal nodule is seen. Hepatobiliary: Mild nodularity of the liver is again identified suspicious for cirrhosis. No gallstones, gallbladder wall thickening, or biliary dilatation. Pancreas: Unremarkable. No pancreatic ductal dilatation or surrounding inflammatory changes. Spleen: Within normal limits. Adrenals/Urinary Tract: Adrenal glands are within normal limits. Kidneys demonstrate cortical thinning but stable from the prior exam. A cystic lesion is noted in the upper pole of the left kidney. No calculi or obstructive changes are noted. Mild renal vascular calcifications are seen. The bladder is partially distended. Stomach/Bowel: Stomach is within normal limits. Appendix is not well seen although no inflammatory changes to suggest appendicitis are noted. No evidence of bowel wall thickening, distention, or inflammatory changes. Vascular/Lymphatic: Aortic atherosclerosis. No enlarged abdominal or pelvic lymph nodes. Reproductive: Prostate is unremarkable. Other: No abdominal wall hernia or abnormality. No abdominopelvic ascites. Musculoskeletal: Degenerative changes of lumbar spine are seen. No compression deformities are noted. No significant disc pathology is noted. IMPRESSION: Chronic changes as described above without acute abnormality. Electronically Signed   By: Inez Catalina M.D.   On: 09/29/2018 14:23   Mr Lumbar Spine Wo Contrast  Result Date: 09/29/2018 CLINICAL DATA:  Initial evaluation for acute back pain radiating into the left lower extremity for 3 weeks, decreased mobility. EXAM: MRI LUMBAR SPINE WITHOUT CONTRAST TECHNIQUE: Multiplanar, multisequence MR imaging of the lumbar spine was performed. No intravenous contrast was administered. COMPARISON:   Prior CT from earlier same day as well as previous MRI from 02/01/2017. FINDINGS: Segmentation: Normal segmentation. Lowest well-formed disc labeled the L5-S1 level. Alignment: Trace 2 mm retrolisthesis of L1 on L2. Mild dextroscoliosis. Vertebral bodies otherwise normally aligned with preservation of the normal lumbar lordosis. Vertebrae: Abnormal marrow edema within the bilateral sacral ala, consistent with acute sacral insufficiency type fractures, primarily involving the S2 segment. Vertebral body heights maintained with no other acute or chronic fracture identified. Underlying bone marrow signal intensity mildly heterogeneous but within normal limits. No worrisome osseous lesions. No other abnormal marrow edema. Conus medullaris and cauda equina: Conus extends to the L1  level. Conus and cauda equina appear normal. Paraspinal and other soft tissues: Confluent edema within the lower posterior paraspinous musculature as well as the presacral soft tissues, likely reactive in nature due to sacral insufficiency type fractures. A degree of associated muscular strain is suspected. Paraspinous soft tissues otherwise within normal limits. Visualized kidneys are atrophic, consistent with history of end-stage renal disease. Single subcentimeter T2 hyperintense simple cyst noted within the left kidney. Disc levels: T12-L1: Unremarkable. L1-2: Trace retrolisthesis. Diffuse disc bulge with disc desiccation. No significant canal or foraminal stenosis. L2-3: Mild annular disc bulge. Small annular fissure at the level of the left lateral recess/foramen. Mild bilateral facet and ligament flavum hypertrophy. No significant canal or neural foraminal stenosis. No impingement. L3-4: Diffuse disc bulge with disc desiccation. Mild facet hypertrophy. Resultant mild left lateral recess narrowing without significant canal stenosis. Mild bilateral L3 foraminal narrowing, right greater than left. L4-5: Diffuse disc bulge with disc  desiccation and mild intervertebral disc space narrowing. Mild reactive endplate changes with marginal endplate osteophytic spurring. Moderate facet and ligament flavum hypertrophy. Left paracentral annular fissure. Mild to moderate canal with left greater than right lateral recess stenosis, relatively unchanged from previous. Mild bilateral L4 foraminal narrowing. L5-S1: Mild disc bulge with disc desiccation. Right paracentral disc protrusion. Mild epidural lipomatosis, partially effacing the distal thecal sac. No other significant canal or lateral recess stenosis. Foramina remain patent. IMPRESSION: 1. Acute sacral insufficiency type fractures involving the bilateral sacral ala, presumably the source of patient's acute lower back pain. 2. Confluent edema within the lower posterior paraspinous musculature, likely reflecting a degree of associated muscular strain/injury. 3. 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 Electronically Signed   By: Jeannine Boga M.D.   On: 09/29/2018 17:03    Positive ROS: All other systems have been reviewed and were otherwise negative with the exception of those mentioned in the HPI and as above.  Physical Exam: General:  Alert, no acute distress Psychiatric:  Patient is competent for consent with normal mood and affect   Cardiovascular:  No pedal edema Respiratory:  No wheezing, non-labored breathing GI:  Abdomen is soft and non-tender Skin:  No lesions in the area of chief complaint Neurologic:  Sensation intact distally Lymphatic:  No axillary or cervical lymphadenopathy  Orthopedic Exam:  Orthopedic examination is limited to the back and lower extremities.  Skin inspection of the back is unremarkable.  There is no swelling, erythema, ecchymosis, abrasions, or other skin abnormalities identified.  He has mild tenderness to percussion across the  sacral region.  He is able to actively flex and extend both hips, both knees, both ankles, and both feet.  Sensation is intact light touch to all distributions.  He has good capillary refill to both feet.  X-rays:  An MRI scan of the lumbar spine was obtained yesterday and has been reviewed by myself.  By report, the scan demonstrates evidence of acute bilateral sacral insufficiency fractures at the level of S2.  There also is multilevel spondylosis changes with mild to moderate central and foraminal stenosis of the lumbar spine.  No other acute fractures are identified.  No lytic lesions are noted.  Assessment: Bilateral sacral insufficiency fractures at S2.  Plan: This injury is most commonly treated nonsurgically and should heal on its own.  It may take 2 to 3 months for complete healing.  The patient may be mobilized with physical therapy and a  walker or cane as symptoms permit.  If he is unable to tolerate physical therapy, then he might benefit from a sacral kyphoplasty.  If this becomes necessary, then I will discuss this with my partner, Dr. Rudene Christians, who performs this procedure.  Thank you for asked me to participate in the care of this most pleasant man.  I will be happy to follow him with you.   Pascal Lux, MD  Beeper #:  [phone]  09/30/2018 7:59 AM  I discussed the condition and procedure with the patient with the aid of an interpreter.  He says his pain is 10 when he walks and 5 when he is laying down we discussed the nature of stress fractures and the slow healing.  He would like to go ahead and have something done to try to get moving again and get relief of pain.  Risk benefits possible complications were discussed and will plan on sacral plasty later today

## 2018-09-30 NOTE — Progress Notes (Signed)
[facility] NAME: Martin Gilbert    MR#:  [id number]  DATE OF BIRTH:  [birth year]  SUBJECTIVE:  why a Spanish interpreter. Patient came in with increasing back pain radiating down to the legs for last few days. Denies any falls. Found to have sacral fracture complains of pain on movement. Wife in the room. Denies any chest pain or shortness of breath  REVIEW OF SYSTEMS:   Review of Systems  Constitutional: Negative for chills, fever and weight loss.  HENT: Negative for ear discharge, ear pain and nosebleeds.   Eyes: Negative for blurred vision, pain and discharge.  Respiratory: Negative for sputum production, shortness of breath, wheezing and stridor.   Cardiovascular: Negative for chest pain, palpitations, orthopnea and PND.  Gastrointestinal: Negative for abdominal pain, diarrhea, nausea and vomiting.  Genitourinary: Negative for frequency and urgency.  Musculoskeletal: Positive for back pain and joint pain.  Neurological: Negative for sensory change, speech change, focal weakness and weakness.  Psychiatric/Behavioral: Negative for depression and hallucinations. The patient is not nervous/anxious.    Tolerating Diet:npo Tolerating PT: patient ambulatory at home  DRUG ALLERGIES:  No Known Allergies  VITALS:  Blood pressure (!) 157/64, pulse 75, temperature 98.7 F (37.1 C), temperature source Oral, resp. rate 16, height 5\' 11"  (1.803 m), weight 74.8 kg, SpO2 95 %.  PHYSICAL EXAMINATION:   Physical Exam  GENERAL:  77 y.o.-year-old patient lying in the bed with no acute distress.  EYES: Pupils equal, round, reactive to light and accommodation. No scleral icterus. Extraocular muscles intact.  HEENT: Head atraumatic, normocephalic. Oropharynx and nasopharynx clear.  NECK:  Supple, no jugular venous distention. No thyroid enlargement, no tenderness.  LUNGS: Normal breath sounds bilaterally, no wheezing, rales,  rhonchi. No use of accessory muscles of respiration.  CARDIOVASCULAR: S1, S2 normal. No murmurs, rubs, or gallops.  ABDOMEN: Soft, nontender, nondistended. Bowel sounds present. No organomegaly or mass.  EXTREMITIES: No cyanosis, clubbing or edema b/l.    NEUROLOGIC: Cranial nerves II through XII are intact. No focal Motor or sensory deficits b/l.   PSYCHIATRIC:  patient is alert and oriented x 3.  SKIN: No obvious rash, lesion, or ulcer.   LABORATORY PANEL:  CBC Recent Labs  Lab 09/30/18 0335  WBC 4.8  HGB 10.3*  HCT 30.9*  PLT 209    Chemistries  Recent Labs  Lab 09/29/18 1344 09/30/18 0335  NA 135 138  K 2.9* 2.9*  CL 97* 101  CO2 28 29  GLUCOSE 176* 51*  BUN 17 25*  CREATININE 3.90* 5.05*  CALCIUM 8.2* 8.3*  AST 26  --   ALT 12  --   ALKPHOS 269*  --   BILITOT 0.9  --    Cardiac Enzymes No results for input(s): TROPONINI in the last 168 hours. RADIOLOGY:  Ct Abdomen Pelvis Wo Contrast  Result Date: 09/29/2018 CLINICAL DATA:  Back pain and weakness EXAM: CT ABDOMEN AND PELVIS WITHOUT CONTRAST TECHNIQUE: Multidetector CT imaging of the abdomen and pelvis was performed following the standard protocol without IV contrast. COMPARISON:  03/29/2017 FINDINGS: Lower chest: Minimal scarring is noted in the left lower lobe. No focal infiltrate or sizable parenchymal nodule is seen. Hepatobiliary: Mild nodularity of the liver is again identified suspicious for cirrhosis. No gallstones, gallbladder wall thickening, or biliary dilatation. Pancreas: Unremarkable. No pancreatic ductal dilatation or surrounding inflammatory changes. Spleen: Within normal limits. Adrenals/Urinary Tract: Adrenal glands are within normal limits. Kidneys demonstrate cortical thinning but  stable from the prior exam. A cystic lesion is noted in the upper pole of the left kidney. No calculi or obstructive changes are noted. Mild renal vascular calcifications are seen. The bladder is partially distended.  Stomach/Bowel: Stomach is within normal limits. Appendix is not well seen although no inflammatory changes to suggest appendicitis are noted. No evidence of bowel wall thickening, distention, or inflammatory changes. Vascular/Lymphatic: Aortic atherosclerosis. No enlarged abdominal or pelvic lymph nodes. Reproductive: Prostate is unremarkable. Other: No abdominal wall hernia or abnormality. No abdominopelvic ascites. Musculoskeletal: Degenerative changes of lumbar spine are seen. No compression deformities are noted. No significant disc pathology is noted. IMPRESSION: Chronic changes as described above without acute abnormality. Electronically Signed   By: Inez Catalina M.D.   On: 09/29/2018 14:23   Mr Lumbar Spine Wo Contrast  Result Date: 09/29/2018 CLINICAL DATA:  Initial evaluation for acute back pain radiating into the left lower extremity for 3 weeks, decreased mobility. EXAM: MRI LUMBAR SPINE WITHOUT CONTRAST TECHNIQUE: Multiplanar, multisequence MR imaging of the lumbar spine was performed. No intravenous contrast was administered. COMPARISON:  Prior CT from earlier same day as well as previous MRI from 02/01/2017. FINDINGS: Segmentation: Normal segmentation. Lowest well-formed disc labeled the L5-S1 level. Alignment: Trace 2 mm retrolisthesis of L1 on L2. Mild dextroscoliosis. Vertebral bodies otherwise normally aligned with preservation of the normal lumbar lordosis. Vertebrae: Abnormal marrow edema within the bilateral sacral ala, consistent with acute sacral insufficiency type fractures, primarily involving the S2 segment. Vertebral body heights maintained with no other acute or chronic fracture identified. Underlying bone marrow signal intensity mildly heterogeneous but within normal limits. No worrisome osseous lesions. No other abnormal marrow edema. Conus medullaris and cauda equina: Conus extends to the L1 level. Conus and cauda equina appear normal. Paraspinal and other soft tissues: Confluent  edema within the lower posterior paraspinous musculature as well as the presacral soft tissues, likely reactive in nature due to sacral insufficiency type fractures. A degree of associated muscular strain is suspected. Paraspinous soft tissues otherwise within normal limits. Visualized kidneys are atrophic, consistent with history of end-stage renal disease. Single subcentimeter T2 hyperintense simple cyst noted within the left kidney. Disc levels: T12-L1: Unremarkable. L1-2: Trace retrolisthesis. Diffuse disc bulge with disc desiccation. No significant canal or foraminal stenosis. L2-3: Mild annular disc bulge. Small annular fissure at the level of the left lateral recess/foramen. Mild bilateral facet and ligament flavum hypertrophy. No significant canal or neural foraminal stenosis. No impingement. L3-4: Diffuse disc bulge with disc desiccation. Mild facet hypertrophy. Resultant mild left lateral recess narrowing without significant canal stenosis. Mild bilateral L3 foraminal narrowing, right greater than left. L4-5: Diffuse disc bulge with disc desiccation and mild intervertebral disc space narrowing. Mild reactive endplate changes with marginal endplate osteophytic spurring. Moderate facet and ligament flavum hypertrophy. Left paracentral annular fissure. Mild to moderate canal with left greater than right lateral recess stenosis, relatively unchanged from previous. Mild bilateral L4 foraminal narrowing. L5-S1: Mild disc bulge with disc desiccation. Right paracentral disc protrusion. Mild epidural lipomatosis, partially effacing the distal thecal sac. No other significant canal or lateral recess stenosis. Foramina remain patent. IMPRESSION: 1. Acute sacral insufficiency type fractures involving the bilateral sacral ala, presumably the source of patient's acute lower back pain. 2. Confluent edema within the lower posterior paraspinous musculature, likely reflecting a degree of associated muscular strain/injury.  3. 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 Electronically Signed  By: Jeannine Boga M.D.   On: 09/29/2018 17:03   ASSESSMENT AND PLAN:  Martin Gilbert  is a 77 y.o. male with a known history of BPH, ESRD on HD, DM, HTN, HLD- had pain in lower back- affecting his walking .  He went to primary care physician last week and was told it could be muscle related and was given some pain medication and muscle relaxer but it did not help much. Came to emergency room today and found to have  fractures on the S1 and S2on MRI.   *Sacral fracture S1/S2 orthopedic consult with Dr. Rudene Christians appreciated. Plans for kyphoplasty noted. Pain management as needed.  *End-stage renal disease on hemodialysis Nephrology consult. Stay Thursday Saturday -please potassium  *Hypertension Continue home medications.  *Hyperlipidemia Continue atorvastatin.  *Diabetes holding glyburide since patient is NPO and keep on sliding scale coverage.  Discharge plan after surgery.  Case discussed with Care Management/Social Worker. Management plans discussed with the patient, family and they are in agreement.  CODE STATUS: **full  DVT Prophylaxis: scd  TOTAL TIME TAKING CARE OF THIS PATIENT: 30 minutes.  >50% time spent on counselling and coordination of care  POSSIBLE D/C IN *1-2* DAYS, DEPENDING ON CLINICAL CONDITION.  Note: This dictation was prepared with Dragon dictation along with smaller phrase technology. Any transcriptional errors that result from this process are unintentional.  Fritzi Mandes M.D on 09/30/2018 at 10:05 AM  Between 7am to 6pm - Pager - [phone]  After 6pm go to [email or web address] - password [email or web address] [facility]  Office  [phone]  CC: Primary care physician; [name], MDPatient ID: Martin Gilbert, male   DOB:  10-Aug-1941, 77 y.o.   MRN: [id number]

## 2018-09-30 NOTE — Progress Notes (Signed)
PT Cancellation Note  Patient Details Name: Martin Gilbert MRN: [id number] DOB: January 15, 1941   Cancelled Treatment:    Reason Eval/Treat Not Completed: Patient not medically ready.  PT consult received.  Chart reviewed.  Pt currently off floor (per chart plan for sacral plasty today).  Anticipate need for new PT orders post-op.    Leitha Bleak, PT 09/30/18, 2:06 PM [phone]

## 2018-09-30 NOTE — Op Note (Signed)
09/30/2018  3:33 PM  PATIENT:  Martin Gilbert  77 y.o. male  PRE-OPERATIVE DIAGNOSIS:  sacral fracture  POST-OPERATIVE DIAGNOSIS:  sacral fracture  PROCEDURE:  Procedure(s): SACROPLASTY (N/A)  SURGEON: Laurene Footman, MD  ASSISTANTS: None  ANESTHESIA:   local and MAC  EBL:  No intake/output data recorded.  BLOOD ADMINISTERED:none  DRAINS: none   LOCAL MEDICATIONS USED:  MARCAINE    and XYLOCAINE   SPECIMEN:  No Specimen  DISPOSITION OF SPECIMEN:  N/A  COUNTS:  YES  TOURNIQUET:  * No tourniquets in log *  IMPLANTS: Bone cement  DICTATION: .Dragon Dictation patient was brought to the operating room and after adequate sedation was obtained the patient was placed prone.  After patient identification and timeout procedure after obtaining good views in both AP and lateral C arm views, 10 cc of 1% Xylocaine was infiltrated subcutaneously in the area of the planned incisions.  The back was then prepped and draped in the usual sterile manner and repeat timeout procedure carried out.  A spinal needle was then used to get local anesthetic down to the sacrum on either side with a 10 cc of half percent Sensorcaine 1% Xylocaine 50-50 mix.  After allowing this to set small incisions were made and trocar was advanced in the long axis along the sacrum getting up to S1.  The cement was mixed and was the appropriate consistency approximately 2-1/2 cc was used on each side to get bone cement into the S1 and S2 levels with true the areas of the fractures without extravasation into the SI joint or the neuroforamen.  When the cemented set trochars were removed and Dermabond used to close the skin, followed by [place]: Continue as observation  PATIENT DISPOSITION:  PACU - hemodynamically stable.

## 2018-09-30 NOTE — Care Management Note (Addendum)
Case Management Note  Patient Details  Name: Martin Gilbert MRN: [id number] Date of Birth: 26-Jan-1941  Subjective/Objective:                  Patient does not read or writer per daughter [name] and speaks Spanish but per [name] it is a different dialect so it makes interpreter's automated services difficult for him to understand. She states he is from home with his wife. She and her sister assist him but they  both work. He is independent with mobility but has a walker available. He uses [facility] to get to hemodialysis T, TH, Sat.  His PCP is with [facility] and that's where he gets most of his medications.  She agrees with home PT however only if the therapist can speak Spanish as she and her sister will not be available to help with interpretations.  She said they've tried the telephone interpreter and it doesn't work well. Surgery planned for today with Dr. Rudene Christians.     Action/Plan:  Estill Bamberg with [facility]/hemodialysis updated. RNCM checking currently with [facility] and so on.  English and Spanish Moon left for patient/Angie at bedside. Update: [facility] does not have a [place] speaking therapist. Neither does [facility], [facility], [facility], or [facility]. [facility] home [facility] does and will accept patient.   Expected Discharge Date:                  Expected Discharge Plan:     In-House Referral:     Discharge planning Services     Post Acute Care Choice:    Choice offered to:     DME Arranged:    DME Agency:     HH Arranged:    HH Agency:     Status of Service:     If discussed at [employer], dates discussed:    Additional Comments:  Marshell Garfinkel, RN 09/30/2018, 10:14 AM

## 2018-09-30 NOTE — Anesthesia Post-op Follow-up Note (Signed)
Anesthesia QCDR form completed.        

## 2018-09-30 NOTE — Transfer of Care (Signed)
Immediate Anesthesia Transfer of Care Note  Patient: Martin Gilbert  Procedure(s) Performed: SACROPLASTY (N/A )  Patient Location: PACU  Anesthesia Type:MAC  Level of Consciousness: awake  Airway & Oxygen Therapy: Patient connected to nasal cannula oxygen  Post-op Assessment: Post -op Vital signs reviewed and stable  Post vital signs: stable  Last Vitals:  Vitals Value Taken Time  BP 167/69 09/30/2018  3:35 PM  Temp    Pulse 69 09/30/2018  3:35 PM  Resp 14 09/30/2018  3:35 PM  SpO2 100 % 09/30/2018  3:35 PM    Last Pain:  Vitals:   09/30/18 1329  TempSrc: Oral  PainSc: 0-No pain      Patients Stated Pain Goal: 3 (61/68/37 2902)  Complications: No apparent anesthesia complications

## 2018-09-30 NOTE — Progress Notes (Signed)
Inpatient Diabetes Program Recommendations  AACE/ADA: New Consensus Statement on Inpatient Glycemic Control (2019)  Target Ranges:  Prepandial:   less than 140 mg/dL      Peak postprandial:   less than 180 mg/dL (1-2 hours)      Critically ill patients:  140 - 180 mg/dL   Results for Martin Gilbert, Martin Gilbert (MRN [id number]) as of 09/30/2018 09:49  Ref. Range 09/29/2018 20:44 09/30/2018 06:32 09/30/2018 08:09  Glucose-Capillary Latest Ref Range: 70 - 99 mg/dL 117 (H) 56 (L) 123 (H)  Results for Martin Gilbert, Martin Gilbert (MRN [id number]) as of 09/30/2018 09:49  Ref. Range 02/15/2017 14:26  Hemoglobin A1C Latest Ref Range: 4.8 - 5.6 % 5.2   Review of Glycemic Control  Diabetes history: DM2 Outpatient Diabetes medications: Amaryl 2 mg BID Current orders for Inpatient glycemic control: Amaryl 2 mg BID, Novolog 0-9 units TID with meals  Inpatient Diabetes Program Recommendations:  Oral Agents: While inpatient, please discontinue Amaryl. Insulin-Correction: Please consider adding Novolog 0-5 units QHS for bedtime correction. HbgA1C: Please consider ordering an A1C to evaluate glycemic control over the past 2-3 months.  Thanks, Barnie Alderman, RN, MSN, CDE Diabetes Coordinator Inpatient Diabetes Program [phone] (Team Pager from 8am to 5pm)

## 2018-09-30 NOTE — Anesthesia Postprocedure Evaluation (Signed)
Anesthesia Post Note  Patient: Martin Gilbert  Procedure(s) Performed: SACROPLASTY (N/A )  Patient location during evaluation: PACU Anesthesia Type: General Level of consciousness: awake and alert Pain management: pain level controlled Vital Signs Assessment: post-procedure vital signs reviewed and stable Respiratory status: spontaneous breathing, nonlabored ventilation, respiratory function stable and patient connected to nasal cannula oxygen Cardiovascular status: blood pressure returned to baseline and stable Postop Assessment: no apparent nausea or vomiting Anesthetic complications: no     Last Vitals:  Vitals:   09/30/18 2000 09/30/18 2123  BP: (!) 172/66 (!) 184/66  Pulse: 64 68  Resp: 18 17  Temp: 36.9 C 37.4 C  SpO2: 94% 95%    Last Pain:  Vitals:   09/30/18 2123  TempSrc: Oral  PainSc:                  Molli Barrows

## 2018-09-30 NOTE — Care Management Obs Status (Signed)
[place] NOTIFICATION   Patient Details  Name: Martin Gilbert MRN: [id number] Date of Birth: April 02, 1941   Medicare Observation Status Notification Given:  Yes Explained to daughter Angie over the phone. Patient cannot read; speaks only Spanish but different dialect per Angie. Spanish [place] and [place] requested by [employer] and left at bedside.    Marshell Garfinkel, RN 09/30/2018, 10:10 AM

## 2018-09-30 NOTE — Progress Notes (Addendum)
MD Cristela Felt made aware of potassium level of 2.9 and Glucose level of 56. Will continue to monitor.  Update: 0640 an ampule of D 50 ml given to pt, as pt is NPO for possible surgery. Pt remained asymptomatic.

## 2018-09-30 NOTE — Anesthesia Preprocedure Evaluation (Signed)
Anesthesia Evaluation  Patient identified by MRN, date of birth, ID band Patient awake    Reviewed: Allergy & Precautions, NPO status , Patient's Chart, lab work & pertinent test results  History of Anesthesia Complications Negative for: history of anesthetic complications  Airway Mallampati: III  TM Distance: >3 FB Neck ROM: Full    Dental  (+) Poor Dentition, Missing, Chipped   Pulmonary neg pulmonary ROS, neg sleep apnea, neg COPD,    breath sounds clear to auscultation- rhonchi (-) wheezing      Cardiovascular hypertension, Pt. on medications (-) CAD, (-) Past MI, (-) Cardiac Stents and (-) CABG  Rhythm:Regular Rate:Normal - Systolic murmurs and - Diastolic murmurs    Neuro/Psych  Headaches, negative psych ROS   GI/Hepatic Neg liver ROS, GERD  ,  Endo/Other  diabetes, Oral Hypoglycemic Agents  Renal/GU ESRF and DialysisRenal disease     Musculoskeletal negative musculoskeletal ROS (+)   Abdominal (+) - obese,   Peds  Hematology  (+) anemia ,   Anesthesia Other Findings Past Medical History: No date: Anemia No date: Back pain No date: BPH (benign prostatic hyperplasia) No date: CKD (chronic kidney disease), stage IV ([name]) No date: Clostridium difficile colitis No date: Diabetes mellitus without complication ([name]) No date: GERD (gastroesophageal reflux disease) No date: HTN (hypertension) No date: Hyperlipidemia   Reproductive/Obstetrics                             Anesthesia Physical Anesthesia Plan  ASA: IV  Anesthesia Plan: General   Post-op Pain Management:    Induction: Intravenous  PONV Risk Score and Plan: 1 and Propofol infusion  Airway Management Planned: Natural Airway  Additional Equipment:   Intra-op Plan:   Post-operative Plan:   Informed Consent: I have reviewed the patients History and Physical, chart, labs and discussed the procedure including the  risks, benefits and alternatives for the proposed anesthesia with the patient or authorized representative who has indicated his/her understanding and acceptance.   Dental advisory given  Plan Discussed with: CRNA and Anesthesiologist  Anesthesia Plan Comments:         Anesthesia Quick Evaluation

## 2018-10-01 ENCOUNTER — Encounter: Payer: Self-pay | Admitting: Orthopedic Surgery

## 2018-10-01 DIAGNOSIS — M4848XA Fatigue fracture of vertebra, sacral and sacrococcygeal region, initial encounter for fracture: Secondary | ICD-10-CM | POA: Diagnosis not present

## 2018-10-01 LAB — GLUCOSE, CAPILLARY: Glucose-Capillary: 82 mg/dL (ref 70–99)

## 2018-10-01 MED ORDER — GLUCERNA SHAKE PO LIQD: 237.0000 mL | Freq: Three times a day (TID) | ORAL | Status: DC

## 2018-10-01 MED ORDER — HYDROCODONE-ACETAMINOPHEN 5-325 MG PO TABS: 1.0000 | ORAL_TABLET | Freq: Four times a day (QID) | ORAL | 0 refills | Status: DC | PRN

## 2018-10-01 NOTE — Progress Notes (Signed)
Physical Therapy Treatment Patient Details Name: Martin Gilbert MRN: [id number] DOB: January 20, 1941 Today's Date: 10/01/2018    History of Present Illness Pt admitted for acute sacral insufficiency fx and is now s/p B sacroplasty. History includes ESRD, DM, HTN, and HLD. Pt with complaints of pain in back affecting ambulation and has elected for surgery this date. Pt is POD 0 at time of evaluation.     PT Comments    Pt is pleasant and ready for PT on arrival. RN just administered pain medications. Pt is progressing toward goals. Demonstrates improved ability to perform ther-ex independently requiring minimal VC for technique. Pt also demonstrates improved mobility transferring with Min A. He ambulated much greater distance fatiguing every ~65 feet, used +2 for chair follow for seated rest breaks. Educated pt on safer methods to use RW with sitting and ambulating, after education demonstrates safely. Will continue to progress pt with strength/balance/mobility as able.   Follow Up Recommendations  Home health PT;Supervision/Assistance - 24 hour     Equipment Recommendations  None recommended by PT    Recommendations for Other Services       Precautions / Restrictions Precautions Precautions: Fall Restrictions Weight Bearing Restrictions: Yes RLE Weight Bearing: Weight bearing as tolerated LLE Weight Bearing: Weight bearing as tolerated    Mobility  Bed Mobility Overal bed mobility: Needs Assistance Bed Mobility: Supine to Sit     Supine to sit: Min guard     General bed mobility comments: Pt demonstrates safe bed mobility. Seated at EOB CGA initally progressed to supervision, no complaints of dizziness.   Transfers Overall transfer level: Needs assistance Equipment used: Rolling walker (2 wheeled) Transfers: Sit to/from Stand Sit to Stand: Min assist         General transfer comment: Pt uses VC for handplacement. Demonstrates improved anterior translation. No  apparent unsteadiness in standing.  Ambulation/Gait Ambulation/Gait assistance: Min guard;+2 safety/equipment(Chair follow) Gait Distance (Feet): 200 Feet Assistive device: Rolling walker (2 wheeled) Gait Pattern/deviations: Step-through pattern Gait velocity: 0.72 ft/sec Gait velocity interpretation: <1.31 ft/sec, indicative of household ambulator General Gait Details: Moderate cues needed for RW distance. Pt has decreased velocity and B step length. Pt fatigues quickly needing a total of 2 seated rest breaks (approx. every 65 feet).   Stairs             Wheelchair Mobility    Modified Rankin (Stroke Patients Only)       Balance Overall balance assessment: Needs assistance Sitting-balance support: Feet supported Sitting balance-Leahy Scale: Fair     Standing balance support: Bilateral upper extremity supported Standing balance-Leahy Scale: Fair                              Cognition Arousal/Alertness: Awake/alert Behavior During Therapy: WFL for tasks assessed/performed Overall Cognitive Status: Within Functional Limits for tasks assessed                                        Exercises Other Exercises Other Exercises: Supine BLE AROM: ankle pumps, SLRs, hip ABD/ADD, heel slides. All ther-ex performed 12 reps with VC for technique. Other Exercises: Sit-to-stand performed 6 reps with VC to inc anterior translation and hand placement.    General Comments        Pertinent Vitals/Pain Pain Assessment: No/denies pain    Home Living  Prior Function            PT Goals (current goals can now be found in the care plan section) Acute Rehab PT Goals Patient Stated Goal: to go home PT Goal Formulation: With patient Time For Goal Achievement: 10/14/18 Potential to Achieve Goals: Good Progress towards PT goals: Progressing toward goals    Frequency    7X/week      PT Plan Current plan remains  appropriate    Co-evaluation              AM-PAC PT "6 Clicks" Daily Activity  Outcome Measure  Difficulty turning over in bed (including adjusting bedclothes, sheets and blankets)?: Unable Difficulty moving from lying on back to sitting on the side of the bed? : Unable Difficulty sitting down on and standing up from a chair with arms (e.g., wheelchair, bedside commode, etc,.)?: Unable Help needed moving to and from a bed to chair (including a wheelchair)?: A Little Help needed walking in hospital room?: A Little Help needed climbing 3-5 steps with a railing? : A Lot 6 Click Score: 11    End of Session Equipment Utilized During Treatment: Gait belt Activity Tolerance: Patient tolerated treatment well(Pt reported inc pain with ambulation but tolerable) Patient left: in chair;with call bell/phone within reach;with chair alarm set Nurse Communication: Mobility status PT Visit Diagnosis: Muscle weakness (generalized) (M62.81);Difficulty in walking, not elsewhere classified (R26.2);Unsteadiness on feet (R26.81)     Time: 3568-6168 PT Time Calculation (min) (ACUTE ONLY): 24 min  Charges:                        Algis Downs, SPT  10/01/2018, 12:00 PM

## 2018-10-01 NOTE — Progress Notes (Signed)
Pt ready for discharge home today per MD. Patient assessment unchanged from this morning. Pt met PT goals. Reviewed discharge instructions and prescriptions with pt and family using interpreter; all questions answered and pt verbalized understanding. PIV removed, VSS. Pt assisted to car via NT.   Ethelda Chick

## 2018-10-01 NOTE — Progress Notes (Signed)
   Subjective: 1 Day Post-Op Procedure(s) (LRB): SACROPLASTY (N/A) Patient reports pain as mild.  Pain improved from surgery Patient is well, and has had no acute complaints or problems Denies any CP, SOB, ABD pain. We will start physical therapy today.    Objective: Vital signs in last 24 hours: Temp:  [97.9 F (36.6 C)-100 F (37.8 C)] 98.7 F (37.1 C) (10/17 0737) Pulse Rate:  [57-75] 65 (10/17 0737) Resp:  [12-19] 17 (10/17 0415) BP: (135-184)/(45-69) 148/58 (10/17 0737) SpO2:  [93 %-100 %] 94 % (10/17 0737) FiO2 (%):  [21 %] 21 % (10/16 1634) Weight:  [74.8 kg] 74.8 kg (10/16 1329)  Intake/Output from previous day: 10/16 0701 - 10/17 0700 In: -  Out: 200 [Urine:200] Intake/Output this shift: No intake/output data recorded.  Recent Labs    09/29/18 1344 09/30/18 0335 09/30/18 1338  HGB 10.5* 10.3* 10.5*   Recent Labs    09/29/18 1344 09/30/18 0335 09/30/18 1338  WBC 4.6 4.8  --   RBC 3.28* 3.20*  --   HCT 31.3* 30.9* 31.0*  PLT 199 209  --    Recent Labs    09/29/18 1344 09/30/18 0335 09/30/18 1338  NA 135 138 138  K 2.9* 2.9* 3.7  CL 97* 101  --   CO2 28 29  --   BUN 17 25*  --   CREATININE 3.90* 5.05*  --   GLUCOSE 176* 51* 69*  CALCIUM 8.2* 8.3*  --    No results for input(s): LABPT, INR in the last 72 hours.  EXAM General - Patient is Alert, Appropriate and Oriented Extremity - Neurovascular intact Sensation intact distally Dressing - dressing C/D/I Motor Function - intact, moving foot and toes well on exam.   Past Medical History:  Diagnosis Date  . Anemia   . Back pain   . BPH (benign prostatic hyperplasia)   . CKD (chronic kidney disease), stage IV ([place])   . Clostridium difficile colitis   . Diabetes mellitus without complication ([place])   . GERD (gastroesophageal reflux disease)   . HTN (hypertension)   . Hyperlipidemia     Assessment/Plan:   1 Day Post-Op Procedure(s) (LRB): SACROPLASTY (N/A) Principal Problem:   Sacral  fracture ([name])  Estimated body mass index is 23.01 kg/m as calculated from the following:   Height as of this encounter: 5\' 11"  (1.803 m).   Weight as of this encounter: 74.8 kg. Advance diet Up with therapy, WBAT,activity as tolerated Overall pain improved. Mild pain this am.  Follow up with [place] [facility] in 2 weeks   T. Rachelle Hora, PA-C [place] 10/01/2018, 8:02 AM

## 2018-10-01 NOTE — Care Management Note (Signed)
Case Management Note  Patient Details  Name: Martin Gilbert MRN: [id number] Date of Birth: 03/21/1941  Subjective/Objective:    Discharging today                Action/Plan: Malachy Mood with [facility] made aware of discharge.   Expected Discharge Date:  10/01/18               Expected Discharge Plan:     In-House Referral:     Discharge planning Services  CM Consult  Post Acute Care Choice:  Home Health Choice offered to:  Adult Children  DME Arranged:    DME Agency:     HH Arranged:  PT HH Agency:  [place]  Status of Service:  In process, will continue to follow  If discussed at Long Length of Stay Meetings, dates discussed:    Additional Comments:  Jolly Mango, RN 10/01/2018, 10:34 AM

## 2018-10-01 NOTE — Discharge Instructions (Signed)
Resume your hemodialysis as before. Patient advised to go to dialysis after discharged from the hospital.

## 2018-10-01 NOTE — Progress Notes (Signed)
[facility], [place] 10/01/18  Subjective:   Patient was admitted for increasing back pain that radiated down the left.  Found to have sacral fracture.  Underwent sacral plasty by Dr. Rudene Christians.  Today his normal dialysis day.  Objective:  Vital signs in last 24 hours:  Temp:  [97.9 F (36.6 C)-100 F (37.8 C)] 98.7 F (37.1 C) (10/17 0737) Pulse Rate:  [57-72] 65 (10/17 0737) Resp:  [12-19] 17 (10/17 0737) BP: (135-184)/(45-69) 148/58 (10/17 0737) SpO2:  [93 %-100 %] 94 % (10/17 0737) FiO2 (%):  [21 %] 21 % (10/16 1634) Weight:  [74.8 kg] 74.8 kg (10/16 1329)  Weight change: 0.001 kg Filed Weights   09/29/18 1118 09/30/18 1329  Weight: 74.8 kg 74.8 kg    Intake/Output:    Intake/Output Summary (Last 24 hours) at 10/01/2018 1022 Last data filed at 10/01/2018 0811 Gross per 24 hour  Intake -  Output 300 ml  Net -300 ml     Physical Exam: General:  No acute distress, laying in the bed  HEENT  moist oral mucous membranes  Neck  supple  Pulm/lungs  clear to auscultation bilaterally  CVS/Heart  regular, no rub  Abdomen:   Soft, nontender  Extremities:  No peripheral edema  Neurologic:  Alert, oriented  Skin:  No acute rashes    Basic Metabolic Panel:  Recent Labs  Lab 09/29/18 1344 09/30/18 0335 09/30/18 1338  NA 135 138 138  K 2.9* 2.9* 3.7  CL 97* 101  --   CO2 28 29  --   GLUCOSE 176* 51* 69*  BUN 17 25*  --   CREATININE 3.90* 5.05*  --   CALCIUM 8.2* 8.3*  --      CBC: Recent Labs  Lab 09/29/18 1344 09/30/18 0335 09/30/18 1338  WBC 4.6 4.8  --   NEUTROABS 2.2  --   --   HGB 10.5* 10.3* 10.5*  HCT 31.3* 30.9* 31.0*  MCV 95.4 96.6  --   PLT 199 209  --       Lab Results  Component Value Date   HEPBSAG Negative 03/28/2017   HEPBSAG Negative 03/28/2017   HEPBSAB Non Reactive 03/28/2017      Microbiology:  Recent Results (from the past 240 hour(s))  MRSA PCR Screening     Status: None   Collection Time:  09/29/18  9:54 PM  Result Value Ref Range Status   MRSA by PCR NEGATIVE NEGATIVE Final    Comment:        The GeneXpert MRSA Assay (FDA approved for NASAL specimens only), is one component of a comprehensive MRSA colonization surveillance program. It is not intended to diagnose MRSA infection nor to guide or monitor treatment for MRSA infections. Performed at [facility], [address] [place],  [address]     Coagulation Studies: No results for input(s): LABPROT, INR in the last 72 hours.  Urinalysis: Recent Labs    09/29/18 1802  COLORURINE YELLOW*  LABSPEC 1.011  PHURINE 8.0  GLUCOSEU 50*  HGBUR NEGATIVE  BILIRUBINUR NEGATIVE  KETONESUR NEGATIVE  PROTEINUR 100*  NITRITE NEGATIVE  LEUKOCYTESUR TRACE*      Imaging: Ct Abdomen Pelvis Wo Contrast  Result Date: 09/29/2018 CLINICAL DATA:  Back pain and weakness EXAM: CT ABDOMEN AND PELVIS WITHOUT CONTRAST TECHNIQUE: Multidetector CT imaging of the abdomen and pelvis was performed following the standard protocol without IV contrast. COMPARISON:  03/29/2017 FINDINGS: Lower chest: Minimal scarring is noted in the left  lower lobe. No focal infiltrate or sizable parenchymal nodule is seen. Hepatobiliary: Mild nodularity of the liver is again identified suspicious for cirrhosis. No gallstones, gallbladder wall thickening, or biliary dilatation. Pancreas: Unremarkable. No pancreatic ductal dilatation or surrounding inflammatory changes. Spleen: Within normal limits. Adrenals/Urinary Tract: Adrenal glands are within normal limits. Kidneys demonstrate cortical thinning but stable from the prior exam. A cystic lesion is noted in the upper pole of the left kidney. No calculi or obstructive changes are noted. Mild renal vascular calcifications are seen. The bladder is partially distended. Stomach/Bowel: Stomach is within normal limits. Appendix is not well seen although no inflammatory changes to suggest appendicitis are  noted. No evidence of bowel wall thickening, distention, or inflammatory changes. Vascular/Lymphatic: Aortic atherosclerosis. No enlarged abdominal or pelvic lymph nodes. Reproductive: Prostate is unremarkable. Other: No abdominal wall hernia or abnormality. No abdominopelvic ascites. Musculoskeletal: Degenerative changes of lumbar spine are seen. No compression deformities are noted. No significant disc pathology is noted. IMPRESSION: Chronic changes as described above without acute abnormality. Electronically Signed   By: Inez Catalina M.D.   On: 09/29/2018 14:23   Dg Lumbar Spine 2-3 Views  Result Date: 09/30/2018 CLINICAL DATA:  Bilateral sacroplasty. EXAM: LUMBAR SPINE - 2-3 VIEW; DG C-ARM 61-120 MIN COMPARISON:  None. FINDINGS: 43 seconds of fluoroscopic time and a total 14.24 mGy of radiation utilized for bilateral sacroplasties. Two views acquired intraoperatively demonstrate cement within the sacral ala bilaterally. No immediate intraoperative complications are identified. IMPRESSION: Fluoroscopic time utilized for bilateral sacroplasty procedure. Electronically Signed   By: Ashley Royalty M.D.   On: 09/30/2018 15:52   Mr Lumbar Spine Wo Contrast  Result Date: 09/29/2018 CLINICAL DATA:  Initial evaluation for acute back pain radiating into the left lower extremity for 3 weeks, decreased mobility. EXAM: MRI LUMBAR SPINE WITHOUT CONTRAST TECHNIQUE: Multiplanar, multisequence MR imaging of the lumbar spine was performed. No intravenous contrast was administered. COMPARISON:  Prior CT from earlier same day as well as previous MRI from 02/01/2017. FINDINGS: Segmentation: Normal segmentation. Lowest well-formed disc labeled the L5-S1 level. Alignment: Trace 2 mm retrolisthesis of L1 on L2. Mild dextroscoliosis. Vertebral bodies otherwise normally aligned with preservation of the normal lumbar lordosis. Vertebrae: Abnormal marrow edema within the bilateral sacral ala, consistent with acute sacral  insufficiency type fractures, primarily involving the S2 segment. Vertebral body heights maintained with no other acute or chronic fracture identified. Underlying bone marrow signal intensity mildly heterogeneous but within normal limits. No worrisome osseous lesions. No other abnormal marrow edema. Conus medullaris and cauda equina: Conus extends to the L1 level. Conus and cauda equina appear normal. Paraspinal and other soft tissues: Confluent edema within the lower posterior paraspinous musculature as well as the presacral soft tissues, likely reactive in nature due to sacral insufficiency type fractures. A degree of associated muscular strain is suspected. Paraspinous soft tissues otherwise within normal limits. Visualized kidneys are atrophic, consistent with history of end-stage renal disease. Single subcentimeter T2 hyperintense simple cyst noted within the left kidney. Disc levels: T12-L1: Unremarkable. L1-2: Trace retrolisthesis. Diffuse disc bulge with disc desiccation. No significant canal or foraminal stenosis. L2-3: Mild annular disc bulge. Small annular fissure at the level of the left lateral recess/foramen. Mild bilateral facet and ligament flavum hypertrophy. No significant canal or neural foraminal stenosis. No impingement. L3-4: Diffuse disc bulge with disc desiccation. Mild facet hypertrophy. Resultant mild left lateral recess narrowing without significant canal stenosis. Mild bilateral L3 foraminal narrowing, right greater than left. L4-5: Diffuse disc bulge  with disc desiccation and mild intervertebral disc space narrowing. Mild reactive endplate changes with marginal endplate osteophytic spurring. Moderate facet and ligament flavum hypertrophy. Left paracentral annular fissure. Mild to moderate canal with left greater than right lateral recess stenosis, relatively unchanged from previous. Mild bilateral L4 foraminal narrowing. L5-S1: Mild disc bulge with disc desiccation. Right paracentral  disc protrusion. Mild epidural lipomatosis, partially effacing the distal thecal sac. No other significant canal or lateral recess stenosis. Foramina remain patent. IMPRESSION: 1. Acute sacral insufficiency type fractures involving the bilateral sacral ala, presumably the source of patient's acute lower back pain. 2. Confluent edema within the lower posterior paraspinous musculature, likely reflecting a degree of associated muscular strain/injury. 3. 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 Electronically Signed   By: Jeannine Boga M.D.   On: 09/29/2018 17:03   Dg C-arm 1-60 Min  Result Date: 09/30/2018 CLINICAL DATA:  Bilateral sacroplasty. EXAM: LUMBAR SPINE - 2-3 VIEW; DG C-ARM 61-120 MIN COMPARISON:  None. FINDINGS: 43 seconds of fluoroscopic time and a total 14.24 mGy of radiation utilized for bilateral sacroplasties. Two views acquired intraoperatively demonstrate cement within the sacral ala bilaterally. No immediate intraoperative complications are identified. IMPRESSION: Fluoroscopic time utilized for bilateral sacroplasty procedure. Electronically Signed   By: Ashley Royalty M.D.   On: 09/30/2018 15:52     Medications:    . allopurinol  100 mg Oral Daily  . aspirin EC  81 mg Oral Daily  . atorvastatin  20 mg Oral Daily  . cholecalciferol  1,000 Units Oral Daily  . docusate sodium  100 mg Oral BID  . feeding supplement (GLUCERNA SHAKE)  237 mL Oral TID BM  . ferrous sulfate  325 mg Oral BID WC  . hydrALAZINE  50 mg Oral TID  . insulin aspart  0-9 Units Subcutaneous TID WC  . metoprolol succinate  25 mg Oral Daily  . multivitamin  1 tablet Oral QHS  . pantoprazole  40 mg Oral Daily  . sodium bicarbonate  650 mg Oral BID  . tamsulosin  0.4 mg Oral QPC supper   acetaminophen, docusate sodium, HYDROcodone-acetaminophen, metoCLOPramide **OR** metoCLOPramide (REGLAN)  injection, ondansetron **OR** ondansetron (ZOFRAN) IV  Assessment/ Plan:  77 y.o. male with end-stage renal disease, diabetes, hypertension, BPH, came in with acute back pain.  Found to have sacral fracture.  Underwent sacroplasty.   [facility]/TTS/210 min/81 kg  1.  Hypokalemia Improved with oral replacement Will adjust outpatient dialysis to 3K bath  2.  End-stage renal disease Called the outpatient dialysis unit.  They can accommodate him at 1130 today Discussed with Dr. Posey Pronto.  Patient will be discharged this morning to go to outpatient dialysis   LOS: 0 Eleena Grater Candiss Norse 10/17/201910:22 AM  [place], [place]  Note: This note was prepared with Dragon dictation. Any transcription errors are unintentional

## 2018-10-01 NOTE — Discharge Summary (Signed)
[facility] [place] NAME: Martin Gilbert    MR#:  [id number]  DATE OF BIRTH:  February 25, 1941  DATE OF ADMISSION:  09/29/2018 ADMITTING PHYSICIAN: Vaughan Basta, MD  DATE OF DISCHARGE: 10/01/2018 PRIMARY CARE PHYSICIAN: [name], MD    ADMISSION DIAGNOSIS:  Closed fracture of sacrum, unspecified fracture morphology, initial encounter ([place]) [S32.10XA]  DISCHARGE DIAGNOSIS:  close fracture of the sacrum S1 and S2 status post hypoplastic by Dr. Rudene Christians.  SECONDARY DIAGNOSIS:   Past Medical History:  Diagnosis Date  . Anemia   . Back pain   . BPH (benign prostatic hyperplasia)   . CKD (chronic kidney disease), stage IV ([place])   . Clostridium difficile colitis   . Diabetes mellitus without complication ([place])   . GERD (gastroesophageal reflux disease)   . HTN (hypertension)   . Hyperlipidemia     HOSPITAL COURSE:  Martin Gilbert y.o.malewith a known history of BPH, ESRD on HD, DM, HTN, HLD- had pain in lower back- affecting his walking .He went to primary care physician last week and was told it could be muscle related and was given some pain medication and muscle relaxer but it did not help much. Came to emergency room today and found to have  fractures on the S1 and S2on MRI.   *Sacral fracture S1/S2 orthopedic consult with Dr. Rudene Christians appreciated. s/p kyphoplasty noted. Pain management as needed. Patient did very well with physical therapy. Will arrange home health PT.  *End-stage renal disease on hemodialysis Nephrology consult. Stay Thursday Saturday -per Dr. Candiss Norse patient will get dialysis as outpatient today according to his schedule.  *Hypertension Continue home medications.  *Hyperlipidemia Continue atorvastatin.  *Diabetes resume home meds.  Discussed with patient and family. CONSULTS OBTAINED:  Treatment Team:  Hessie Knows, MD Murlean Iba, MD  DRUG  ALLERGIES:  No Known Allergies  DISCHARGE MEDICATIONS:   Allergies as of 10/01/2018   No Known Allergies     Medication List    STOP taking these medications   feeding supplement (GLUCERNA SHAKE) Liqd   insulin glargine 100 UNIT/ML injection Commonly known as:  LANTUS   insulin lispro 100 UNIT/ML injection Commonly known as:  HUMALOG   methocarbamol 750 MG tablet Commonly known as:  ROBAXIN   ondansetron 4 MG tablet Commonly known as:  ZOFRAN   predniSONE 10 MG (21) Tbpk tablet Commonly known as:  STERAPRED UNI-PAK 21 TAB     TAKE these medications   acetaminophen 325 MG tablet Commonly known as:  TYLENOL Take 2 tablets (650 mg total) by mouth every 6 (six) hours as needed for mild pain (or Fever >/= 101).   allopurinol 100 MG tablet Commonly known as:  ZYLOPRIM Take 100 mg by mouth daily.   aspirin 81 MG tablet Take 81 mg by mouth daily.   atorvastatin 20 MG tablet Commonly known as:  LIPITOR Take 20 mg by mouth daily.   ferrous sulfate 325 (65 FE) MG tablet Take 325 mg by mouth 2 (two) times daily with a meal.   glimepiride 2 MG tablet Commonly known as:  AMARYL Take 2 mg by mouth 2 (two) times daily.   hydrALAZINE 50 MG tablet Commonly known as:  APRESOLINE Take 50 mg by mouth 3 (three) times daily.   HYDROcodone-acetaminophen 5-325 MG tablet Commonly known as:  NORCO/VICODIN Take 1 tablet by mouth every 6 (six) hours as needed for moderate pain or severe pain.   lidocaine-prilocaine cream Commonly known as:  EMLA Apply 1 application topically daily as needed.   Melatonin 5 MG Tabs Take 1 tablet (5 mg total) by mouth at bedtime.   metoprolol succinate 25 MG 24 hr tablet Commonly known as:  TOPROL-XL Take 25 mg by mouth daily.   multivitamin Tabs tablet Take 1 tablet by mouth at bedtime.   pantoprazole 40 MG tablet Commonly known as:  PROTONIX Take 1 tablet (40 mg total) by mouth daily.   REFRESH OP Place 1 drop into both eyes 2 (two)  times daily.   sodium bicarbonate 650 MG tablet Take 650 mg by mouth 2 (two) times daily.   tamsulosin 0.4 MG Caps capsule Commonly known as:  FLOMAX Take 1 capsule (0.4 mg total) by mouth daily after supper.   Vitamin D (Cholecalciferol) 1000 units Tabs Take 1 tablet by mouth daily.       If you experience worsening of your admission symptoms, develop shortness of breath, life threatening emergency, suicidal or homicidal thoughts you must seek medical attention immediately by calling 911 or calling your MD immediately  if symptoms less severe.  You Must read complete instructions/literature along with all the possible adverse reactions/side effects for all the Medicines you take and that have been prescribed to you. Take any new Medicines after you have completely understood and accept all the possible adverse reactions/side effects.   Please note  You were cared for by a hospitalist during your hospital stay. If you have any questions about your discharge medications or the care you received while you were in the hospital after you are discharged, you can call the unit and asked to speak with the hospitalist on call if the hospitalist that took care of you is not available. Once you are discharged, your primary care physician will handle any further medical issues. Please note that NO REFILLS for any discharge medications will be authorized once you are discharged, as it is imperative that you return to your primary care physician (or establish a relationship with a primary care physician if you do not have one) for your aftercare needs so that they can reassess your need for medications and monitor your lab values. Today   SUBJECTIVE    Doing well some back pain VITAL SIGNS:  Blood pressure (!) 148/58, pulse 65, temperature 98.7 F (37.1 C), temperature source Oral, resp. rate 17, height 5\' 11"  (1.803 m), weight 74.8 kg, SpO2 94 %.  I/O:    Intake/Output Summary (Last 24 hours)  at 10/01/2018 1035 Last data filed at 10/01/2018 0900 Gross per 24 hour  Intake 360 ml  Output 300 ml  Net 60 ml    PHYSICAL EXAMINATION:  GENERAL:  77 y.o.-year-old patient lying in the bed with no acute distress.  EYES: Pupils equal, round, reactive to light and accommodation. No scleral icterus. Extraocular muscles intact.  HEENT: Head atraumatic, normocephalic. Oropharynx and nasopharynx clear.  NECK:  Supple, no jugular venous distention. No thyroid enlargement, no tenderness.  LUNGS: Normal breath sounds bilaterally, no wheezing, rales,rhonchi or crepitation. No use of accessory muscles of respiration.  CARDIOVASCULAR: S1, S2 normal. No murmurs, rubs, or gallops.  ABDOMEN: Soft, non-tender, non-distended. Bowel sounds present. No organomegaly or mass.  EXTREMITIES: No pedal edema, cyanosis, or clubbing.  NEUROLOGIC: Cranial nerves II through XII are intact. Muscle strength 5/5 in all extremities. Sensation intact. Gait not checked.  PSYCHIATRIC: The patient is alert and oriented x 3.  SKIN: No obvious rash, lesion, or ulcer.   DATA REVIEW:  CBC  Recent Labs  Lab 09/30/18 0335 09/30/18 1338  WBC 4.8  --   HGB 10.3* 10.5*  HCT 30.9* 31.0*  PLT 209  --     Chemistries  Recent Labs  Lab 09/29/18 1344 09/30/18 0335 09/30/18 1338  NA 135 138 138  K 2.9* 2.9* 3.7  CL 97* 101  --   CO2 28 29  --   GLUCOSE 176* 51* 69*  BUN 17 25*  --   CREATININE 3.90* 5.05*  --   CALCIUM 8.2* 8.3*  --   AST 26  --   --   ALT 12  --   --   ALKPHOS 269*  --   --   BILITOT 0.9  --   --     Microbiology Results   Recent Results (from the past 240 hour(s))  MRSA PCR Screening     Status: None   Collection Time: 09/29/18  9:54 PM  Result Value Ref Range Status   MRSA by PCR NEGATIVE NEGATIVE Final    Comment:        The GeneXpert MRSA Assay (FDA approved for NASAL specimens only), is one component of a comprehensive MRSA colonization surveillance program. It is  not intended to diagnose MRSA infection nor to guide or monitor treatment for MRSA infections. Performed at [facility], [address] [place], [place] [address]     RADIOLOGY:  Ct Abdomen Pelvis Wo Contrast  Result Date: 09/29/2018 CLINICAL DATA:  Back pain and weakness EXAM: CT ABDOMEN AND PELVIS WITHOUT CONTRAST TECHNIQUE: Multidetector CT imaging of the abdomen and pelvis was performed following the standard protocol without IV contrast. COMPARISON:  03/29/2017 FINDINGS: Lower chest: Minimal scarring is noted in the left lower lobe. No focal infiltrate or sizable parenchymal nodule is seen. Hepatobiliary: Mild nodularity of the liver is again identified suspicious for cirrhosis. No gallstones, gallbladder wall thickening, or biliary dilatation. Pancreas: Unremarkable. No pancreatic ductal dilatation or surrounding inflammatory changes. Spleen: Within normal limits. Adrenals/Urinary Tract: Adrenal glands are within normal limits. Kidneys demonstrate cortical thinning but stable from the prior exam. A cystic lesion is noted in the upper pole of the left kidney. No calculi or obstructive changes are noted. Mild renal vascular calcifications are seen. The bladder is partially distended. Stomach/Bowel: Stomach is within normal limits. Appendix is not well seen although no inflammatory changes to suggest appendicitis are noted. No evidence of bowel wall thickening, distention, or inflammatory changes. Vascular/Lymphatic: Aortic atherosclerosis. No enlarged abdominal or pelvic lymph nodes. Reproductive: Prostate is unremarkable. Other: No abdominal wall hernia or abnormality. No abdominopelvic ascites. Musculoskeletal: Degenerative changes of lumbar spine are seen. No compression deformities are noted. No significant disc pathology is noted. IMPRESSION: Chronic changes as described above without acute abnormality. Electronically Signed   By: Inez Catalina M.D.   On: 09/29/2018 14:23   Dg  Lumbar Spine 2-3 Views  Result Date: 09/30/2018 CLINICAL DATA:  Bilateral sacroplasty. EXAM: LUMBAR SPINE - 2-3 VIEW; DG C-ARM 61-120 MIN COMPARISON:  None. FINDINGS: 43 seconds of fluoroscopic time and a total 14.24 mGy of radiation utilized for bilateral sacroplasties. Two views acquired intraoperatively demonstrate cement within the sacral ala bilaterally. No immediate intraoperative complications are identified. IMPRESSION: Fluoroscopic time utilized for bilateral sacroplasty procedure. Electronically Signed   By: Ashley Royalty M.D.   On: 09/30/2018 15:52   Mr Lumbar Spine Wo Contrast  Result Date: 09/29/2018 CLINICAL DATA:  Initial evaluation for acute back pain radiating into the left lower extremity  for 3 weeks, decreased mobility. EXAM: MRI LUMBAR SPINE WITHOUT CONTRAST TECHNIQUE: Multiplanar, multisequence MR imaging of the lumbar spine was performed. No intravenous contrast was administered. COMPARISON:  Prior CT from earlier same day as well as previous MRI from 02/01/2017. FINDINGS: Segmentation: Normal segmentation. Lowest well-formed disc labeled the L5-S1 level. Alignment: Trace 2 mm retrolisthesis of L1 on L2. Mild dextroscoliosis. Vertebral bodies otherwise normally aligned with preservation of the normal lumbar lordosis. Vertebrae: Abnormal marrow edema within the bilateral sacral ala, consistent with acute sacral insufficiency type fractures, primarily involving the S2 segment. Vertebral body heights maintained with no other acute or chronic fracture identified. Underlying bone marrow signal intensity mildly heterogeneous but within normal limits. No worrisome osseous lesions. No other abnormal marrow edema. Conus medullaris and cauda equina: Conus extends to the L1 level. Conus and cauda equina appear normal. Paraspinal and other soft tissues: Confluent edema within the lower posterior paraspinous musculature as well as the presacral soft tissues, likely reactive in nature due to sacral  insufficiency type fractures. A degree of associated muscular strain is suspected. Paraspinous soft tissues otherwise within normal limits. Visualized kidneys are atrophic, consistent with history of end-stage renal disease. Single subcentimeter T2 hyperintense simple cyst noted within the left kidney. Disc levels: T12-L1: Unremarkable. L1-2: Trace retrolisthesis. Diffuse disc bulge with disc desiccation. No significant canal or foraminal stenosis. L2-3: Mild annular disc bulge. Small annular fissure at the level of the left lateral recess/foramen. Mild bilateral facet and ligament flavum hypertrophy. No significant canal or neural foraminal stenosis. No impingement. L3-4: Diffuse disc bulge with disc desiccation. Mild facet hypertrophy. Resultant mild left lateral recess narrowing without significant canal stenosis. Mild bilateral L3 foraminal narrowing, right greater than left. L4-5: Diffuse disc bulge with disc desiccation and mild intervertebral disc space narrowing. Mild reactive endplate changes with marginal endplate osteophytic spurring. Moderate facet and ligament flavum hypertrophy. Left paracentral annular fissure. Mild to moderate canal with left greater than right lateral recess stenosis, relatively unchanged from previous. Mild bilateral L4 foraminal narrowing. L5-S1: Mild disc bulge with disc desiccation. Right paracentral disc protrusion. Mild epidural lipomatosis, partially effacing the distal thecal sac. No other significant canal or lateral recess stenosis. Foramina remain patent. IMPRESSION: 1. Acute sacral insufficiency type fractures involving the bilateral sacral ala, presumably the source of patient's acute lower back pain. 2. Confluent edema within the lower posterior paraspinous musculature, likely reflecting a degree of associated muscular strain/injury. 3. 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 Electronically Signed   By: Jeannine Boga M.D.   On: 09/29/2018 17:03   Dg C-arm 1-60 Min  Result Date: 09/30/2018 CLINICAL DATA:  Bilateral sacroplasty. EXAM: LUMBAR SPINE - 2-3 VIEW; DG C-ARM 61-120 MIN COMPARISON:  None. FINDINGS: 43 seconds of fluoroscopic time and a total 14.24 mGy of radiation utilized for bilateral sacroplasties. Two views acquired intraoperatively demonstrate cement within the sacral ala bilaterally. No immediate intraoperative complications are identified. IMPRESSION: Fluoroscopic time utilized for bilateral sacroplasty procedure. Electronically Signed   By: Ashley Royalty M.D.   On: 09/30/2018 15:52     Management plans discussed with the patient, family and they are in agreement.  CODE STATUS:     Code Status Orders  (From admission, onward)         Start     Ordered   09/29/18 2026  Full code  Continuous     09/29/18 2025  Code Status History    Date Active Date Inactive Code Status Order ID Comments User Context   11/25/2017 1939 11/28/2017 2014 Full Code [id number]  Dustin Flock, MD ED   09/05/2017 1508 09/05/2017 1955 Full Code [id number]  Schnier, Dolores Lory, MD Inpatient   08/02/2017 1834 08/06/2017 1959 Full Code [id number]  Loletha Grayer, MD ED   03/27/2017 0542 04/04/2017 2141 Full Code [id number]  Saundra Shelling, MD Inpatient   02/15/2017 0911 02/19/2017 1804 Full Code [id number]  Saundra Shelling, MD Inpatient   01/31/2017 0236 02/02/2017 2022 Full Code [id number]  Saundra Shelling, MD ED      TOTAL TIME TAKING CARE OF THIS PATIENT: *40* minutes.    Fritzi Mandes M.D on 10/01/2018 at 10:35 AM  Between 7am to 6pm - Pager - [phone] After 6pm go to [email or web address] - password [email or web address] [facility]  Office  [phone]  CC: Primary care physician; [name], MD

## 2018-10-10 ENCOUNTER — Inpatient Hospital Stay
Admission: EM | Admit: 2018-10-10 | Discharge: 2018-10-14 | DRG: 391 | Disposition: A | Discharge status: Home Health | Payer: Medicare Other | Attending: Internal Medicine | Admitting: Internal Medicine

## 2018-10-10 ENCOUNTER — Emergency Department: Payer: Medicare Other

## 2018-10-10 ENCOUNTER — Other Ambulatory Visit: Payer: Self-pay

## 2018-10-10 ENCOUNTER — Encounter: Payer: Self-pay | Admitting: Emergency Medicine

## 2018-10-10 DIAGNOSIS — Z992 Dependence on renal dialysis: Secondary | ICD-10-CM

## 2018-10-10 DIAGNOSIS — Z7982 Long term (current) use of aspirin: Secondary | ICD-10-CM

## 2018-10-10 DIAGNOSIS — E872 Acidosis: Secondary | ICD-10-CM | POA: Diagnosis not present

## 2018-10-10 DIAGNOSIS — R109 Unspecified abdominal pain: Secondary | ICD-10-CM | POA: Diagnosis not present

## 2018-10-10 DIAGNOSIS — A084 Viral intestinal infection, unspecified: Secondary | ICD-10-CM | POA: Diagnosis not present

## 2018-10-10 DIAGNOSIS — E1122 Type 2 diabetes mellitus with diabetic chronic kidney disease: Secondary | ICD-10-CM | POA: Diagnosis present

## 2018-10-10 DIAGNOSIS — M25551 Pain in right hip: Secondary | ICD-10-CM | POA: Diagnosis present

## 2018-10-10 DIAGNOSIS — M25552 Pain in left hip: Secondary | ICD-10-CM | POA: Diagnosis present

## 2018-10-10 DIAGNOSIS — I120 Hypertensive chronic kidney disease with stage 5 chronic kidney disease or end stage renal disease: Secondary | ICD-10-CM | POA: Diagnosis present

## 2018-10-10 DIAGNOSIS — K2970 Gastritis, unspecified, without bleeding: Secondary | ICD-10-CM | POA: Diagnosis present

## 2018-10-10 DIAGNOSIS — D631 Anemia in chronic kidney disease: Secondary | ICD-10-CM | POA: Diagnosis present

## 2018-10-10 DIAGNOSIS — N2581 Secondary hyperparathyroidism of renal origin: Secondary | ICD-10-CM | POA: Diagnosis present

## 2018-10-10 DIAGNOSIS — E785 Hyperlipidemia, unspecified: Secondary | ICD-10-CM | POA: Diagnosis present

## 2018-10-10 DIAGNOSIS — N186 End stage renal disease: Secondary | ICD-10-CM | POA: Diagnosis present

## 2018-10-10 DIAGNOSIS — K219 Gastro-esophageal reflux disease without esophagitis: Secondary | ICD-10-CM | POA: Diagnosis present

## 2018-10-10 DIAGNOSIS — I517 Cardiomegaly: Secondary | ICD-10-CM | POA: Diagnosis present

## 2018-10-10 DIAGNOSIS — N400 Enlarged prostate without lower urinary tract symptoms: Secondary | ICD-10-CM | POA: Diagnosis present

## 2018-10-10 DIAGNOSIS — M109 Gout, unspecified: Secondary | ICD-10-CM | POA: Diagnosis present

## 2018-10-10 DIAGNOSIS — Z79899 Other long term (current) drug therapy: Secondary | ICD-10-CM

## 2018-10-10 DIAGNOSIS — Z9889 Other specified postprocedural states: Secondary | ICD-10-CM

## 2018-10-10 DIAGNOSIS — R112 Nausea with vomiting, unspecified: Secondary | ICD-10-CM | POA: Diagnosis present

## 2018-10-10 DIAGNOSIS — I2510 Atherosclerotic heart disease of native coronary artery without angina pectoris: Secondary | ICD-10-CM | POA: Diagnosis present

## 2018-10-10 DIAGNOSIS — K529 Noninfective gastroenteritis and colitis, unspecified: Secondary | ICD-10-CM | POA: Diagnosis present

## 2018-10-10 DIAGNOSIS — R509 Fever, unspecified: Secondary | ICD-10-CM

## 2018-10-10 DIAGNOSIS — E860 Dehydration: Secondary | ICD-10-CM | POA: Diagnosis present

## 2018-10-10 DIAGNOSIS — K7460 Unspecified cirrhosis of liver: Secondary | ICD-10-CM | POA: Diagnosis present

## 2018-10-10 DIAGNOSIS — Z8249 Family history of ischemic heart disease and other diseases of the circulatory system: Secondary | ICD-10-CM

## 2018-10-10 LAB — COMPREHENSIVE METABOLIC PANEL
ALT: 11 U/L (ref 0–44)
ANION GAP: 10 (ref 5–15)
AST: 26 U/L (ref 15–41)
Albumin: 3 g/dL — ABNORMAL LOW (ref 3.5–5.0)
Alkaline Phosphatase: 219 U/L — ABNORMAL HIGH (ref 38–126)
BUN: 22 mg/dL (ref 8–23)
CHLORIDE: 102 mmol/L (ref 98–111)
CO2: 27 mmol/L (ref 22–32)
Calcium: 8.5 mg/dL — ABNORMAL LOW (ref 8.9–10.3)
Creatinine, Ser: 4.01 mg/dL — ABNORMAL HIGH (ref 0.61–1.24)
GFR, EST AFRICAN AMERICAN: 15 mL/min — AB (ref >60)
GFR, EST NON AFRICAN AMERICAN: 13 mL/min — AB (ref >60)
Glucose, Bld: 118 mg/dL — ABNORMAL HIGH (ref 70–99)
Potassium: 3.9 mmol/L (ref 3.5–5.1)
Sodium: 139 mmol/L (ref 135–145)
Total Bilirubin: 1.1 mg/dL (ref 0.3–1.2)
Total Protein: 6.7 g/dL (ref 6.5–8.1)

## 2018-10-10 LAB — CBC
HEMATOCRIT: 32.8 % — AB (ref 39.0–52.0)
Hemoglobin: 10.6 g/dL — ABNORMAL LOW (ref 13.0–17.0)
MCH: 31.5 pg (ref 26.0–34.0)
MCHC: 32.3 g/dL (ref 30.0–36.0)
MCV: 97.6 fL (ref 80.0–100.0)
NRBC: 0 % (ref 0.0–0.2)
PLATELETS: 241 10*3/uL (ref 150–400)
RBC: 3.36 MIL/uL — AB (ref 4.22–5.81)
RDW: 13.3 % (ref 11.5–15.5)
WBC: 10 10*3/uL (ref 4.0–10.5)

## 2018-10-10 LAB — URINALYSIS, COMPLETE (UACMP) WITH MICROSCOPIC
BACTERIA UA: NONE SEEN
BILIRUBIN URINE: NEGATIVE
GLUCOSE, UA: NEGATIVE mg/dL
Hgb urine dipstick: NEGATIVE
KETONES UR: NEGATIVE mg/dL
NITRITE: NEGATIVE
PH: 8 (ref 5.0–8.0)
Protein, ur: >=300 mg/dL — AB
Specific Gravity, Urine: 1.016 (ref 1.005–1.030)
WBC, UA: >50 WBC/hpf — ABNORMAL HIGH (ref 0–5)

## 2018-10-10 LAB — LIPASE, BLOOD: LIPASE: 36 U/L (ref 11–51)

## 2018-10-10 LAB — TROPONIN I: Troponin I: 0.13 ng/mL (ref <0.03)

## 2018-10-10 LAB — GLUCOSE, CAPILLARY: Glucose-Capillary: 108 mg/dL — ABNORMAL HIGH (ref 70–99)

## 2018-10-10 MED ORDER — METOCLOPRAMIDE HCL 5 MG/ML IJ SOLN
10.0000 mg | Freq: Once | INTRAMUSCULAR | Status: AC
  Administered 2018-10-10: 10 mg via INTRAVENOUS
  Filled 2018-10-10: qty 2

## 2018-10-10 MED ORDER — MORPHINE SULFATE (PF) 2 MG/ML IV SOLN
2.0000 mg | Freq: Once | INTRAVENOUS | Status: AC
  Administered 2018-10-10: 2 mg via INTRAVENOUS
  Filled 2018-10-10: qty 1

## 2018-10-10 MED ORDER — ONDANSETRON 4 MG PO TBDP
4.0000 mg | ORAL_TABLET | Freq: Once | ORAL | Status: AC | PRN
  Administered 2018-10-10: 4 mg via ORAL
  Filled 2018-10-10: qty 1

## 2018-10-10 MED ORDER — ONDANSETRON HCL 4 MG/2ML IJ SOLN
4.0000 mg | Freq: Once | INTRAMUSCULAR | Status: AC
  Administered 2018-10-10: 4 mg via INTRAVENOUS
  Filled 2018-10-10: qty 2

## 2018-10-10 NOTE — ED Notes (Addendum)
ED Provider at bedside.  Pt reports dialysis today that went "normally", recent complaints of weakness in left leg and nausea, pt reports unable to walk since back surgery on Oct 15, also hx of umbilical hernia repair, pt takes meds for gout, HTN, DM, prostrate

## 2018-10-10 NOTE — H&P (Signed)
[facility] [place] NAME: Martin Gilbert    MR#:  [id number]  DATE OF BIRTH:  14-Jun-1941  DATE OF ADMISSION:  10/10/2018  PRIMARY CARE PHYSICIAN: [name], MD   REQUESTING/REFERRING PHYSICIAN:   CHIEF COMPLAINT:   Chief Complaint  Patient presents with  . Emesis    HISTORY OF PRESENT ILLNESS: Martin Gilbert  is a 77 y.o. male with a known history of end-stage renal disease, on hemodialysis, hypertension, diabetes type 2, hyperlipidemia and other comorbidities.  Patient is status post  kyphoplasty for sacral spine fracture at S1/S2 level; this was done 2 weeks ago.  Patient states he is unable to ambulate, he is unable to bear weight on the left lower extremity. He presented to emergency room for intermittent nausea, vomiting and upper abdominal pain going on for the past 5 days, gradually getting worse.  No reports of fever, chills, diarrhea, bleeding.  He denies any sick contacts, he denies any new medications.  No prior similar episodes. Blood test done emergency room are notable for creatinine level of 4.01 and troponin level is 0.13. EKG shows sinus rhythm, first-degree AV block, rate is 66 bpm, right bundle branch block, left anterior fascicular block, long QT. CT abdomen and pelvis is negative for acute abnormalities.  Patient is admitted for further evaluation and treatment.  PAST MEDICAL HISTORY:   Past Medical History:  Diagnosis Date  . Anemia   . Back pain   . BPH (benign prostatic hyperplasia)   . CKD (chronic kidney disease), stage IV ([place])   . Clostridium difficile colitis   . Diabetes mellitus without complication ([place])   . GERD (gastroesophageal reflux disease)   . HTN (hypertension)   . Hyperlipidemia     PAST SURGICAL HISTORY:  Past Surgical History:  Procedure Laterality Date  . A/V FISTULAGRAM Left 04/20/2018   Procedure: A/V FISTULAGRAM;  Surgeon: Algernon Huxley, MD;  Location:  [place] [facility];  Service: Cardiovascular;  Laterality: Left;  . AV FISTULA PLACEMENT Left 06/13/2017   Procedure: ARTERIOVENOUS (AV) FISTULA CREATION ( RADIAL CEPHALIC );  Surgeon: Katha Cabal, MD;  Location: [facility];  Service: Vascular;  Laterality: Left;  . CATARACT EXTRACTION     one eye not sure which  . DIALYSIS/PERMA CATHETER INSERTION N/A 04/03/2017   Procedure: Dialysis/Perma Catheter Insertion;  Surgeon: Algernon Huxley, MD;  Location: [place] [facility];  Service: Cardiovascular;  Laterality: N/A;  . DIALYSIS/PERMA CATHETER INSERTION N/A 08/06/2017   Procedure: DIALYSIS/PERMA CATHETER INSERTION;  Surgeon: Algernon Huxley, MD;  Location: [place] [facility];  Service: Cardiovascular;  Laterality: N/A;  . DIALYSIS/PERMA CATHETER REMOVAL N/A 08/04/2017   Procedure: DIALYSIS/PERMA CATHETER REMOVAL;  Surgeon: Algernon Huxley, MD;  Location: [place] [facility];  Service: Cardiovascular;  Laterality: N/A;  . DIALYSIS/PERMA CATHETER REMOVAL N/A 12/31/2017   Procedure: DIALYSIS/PERMA CATHETER REMOVAL;  Surgeon: Algernon Huxley, MD;  Location: [place] [facility];  Service: Cardiovascular;  Laterality: N/A;  . hernia repair    . SACROPLASTY N/A 09/30/2018   Procedure: SACROPLASTY;  Surgeon: Hessie Knows, MD;  Location: [facility];  Service: Orthopedics;  Laterality: N/A;  . UPPER EXTREMITY ANGIOGRAPHY Left 09/05/2017   Procedure: Upper Extremity Angiography;  Surgeon: Katha Cabal, MD;  Location: [place] [facility];  Service: Cardiovascular;  Laterality: Left;    SOCIAL HISTORY:  Social History   Tobacco Use  . Smoking status: Never Smoker  . Smokeless  tobacco: Never Used  Substance Use Topics  . Alcohol use: No    Comment: Drank in the past, but has stopped for several years.     FAMILY HISTORY:  Family History  Problem Relation Age of Onset  . Hypertension Mother   . Diabetes Neg Hx     DRUG ALLERGIES: No Known Allergies  REVIEW OF SYSTEMS:   CONSTITUTIONAL: No  fever, fatigue or weakness.  EYES: No changes in vision.  EARS, NOSE, AND THROAT: No tinnitus or ear pain.  RESPIRATORY: No cough, shortness of breath, wheezing or hemoptysis.  CARDIOVASCULAR: No chest pain, orthopnea, edema.  GASTROINTESTINAL: Positive for nausea, vomiting, and upper abdominal pain.  No diarrhea no rectal bleeding. GENITOURINARY: No dysuria, hematuria.  ENDOCRINE: No polyuria, nocturia. HEMATOLOGY: No bleeding. SKIN: No rash or lesion. MUSCULOSKELETAL: Inability to ambulate and bear weight on the left lower extremity, status post recent S1-S2 fracture.   NEUROLOGIC: No focal weakness.  PSYCHIATRY: No anxiety or depression.   MEDICATIONS AT HOME:  Prior to Admission medications   Medication Sig Start Date End Date Taking? Authorizing Provider  acetaminophen (TYLENOL) 325 MG tablet Take 2 tablets (650 mg total) by mouth every 6 (six) hours as needed for mild pain (or Fever >/= 101). 11/28/17   [name], MD  allopurinol (ZYLOPRIM) 100 MG tablet Take 100 mg by mouth daily. 10/17/15   [provider]  aspirin 81 MG tablet Take 81 mg by mouth daily.    [provider]  atorvastatin (LIPITOR) 20 MG tablet Take 20 mg by mouth daily.    [provider]  ferrous sulfate 325 (65 FE) MG tablet Take 325 mg by mouth 2 (two) times daily with a meal.  08/30/15   [provider]  glimepiride (AMARYL) 2 MG tablet Take 2 mg by mouth 2 (two) times daily.  04/24/17   [provider]  hydrALAZINE (APRESOLINE) 50 MG tablet Take 50 mg by mouth 3 (three) times daily.    [provider]  HYDROcodone-acetaminophen (NORCO) 5-325 MG tablet Take 1 tablet by mouth every 6 (six) hours as needed for moderate pain or severe pain. 10/01/18   Fritzi Mandes, MD  lidocaine-prilocaine (EMLA) cream Apply 1 application topically daily as needed. 10/16/17   [provider]  Melatonin 5 MG TABS Take 1 tablet (5 mg total) by mouth at bedtime. Patient not  taking: Reported on 11/25/2017 04/04/17   Gladstone Lighter, MD  metoprolol succinate (TOPROL-XL) 25 MG 24 hr tablet Take 25 mg by mouth daily.    [provider]  multivitamin (RENA-VIT) TABS tablet Take 1 tablet by mouth at bedtime. 11/28/17   Gouru, Illene Silver, MD  pantoprazole (PROTONIX) 40 MG tablet Take 1 tablet (40 mg total) by mouth daily. 02/03/17   Theodoro Grist, MD  Polyvinyl Alcohol-Povidone (REFRESH OP) Place 1 drop into both eyes 2 (two) times daily.    [provider]  sodium bicarbonate 650 MG tablet Take 650 mg by mouth 2 (two) times daily.    [provider]  tamsulosin (FLOMAX) 0.4 MG CAPS capsule Take 1 capsule (0.4 mg total) by mouth daily after supper. 02/19/17   Epifanio Lesches, MD  Vitamin D, Cholecalciferol, 1000 UNITS TABS Take 1 tablet by mouth daily.     [provider]      PHYSICAL EXAMINATION:   VITAL SIGNS: Blood pressure (!) 145/59, pulse 61, temperature 98.3 F (36.8 C), temperature source Oral, resp. rate 17, height 5\' 11"  (1.803 m), weight 72.6  kg, SpO2 93 %.  GENERAL:  77 y.o.-year-old patient lying in the bed with no acute distress.  He looks acutely ill, exhausted, nontoxic. EYES: Pupils equal, round, reactive to light and accommodation. No scleral icterus. Extraocular muscles intact.  HEENT: Head atraumatic, normocephalic. Oropharynx and nasopharynx clear.  NECK:  Supple, no jugular venous distention. No thyroid enlargement, no tenderness.  LUNGS: Normal breath sounds bilaterally, no wheezing, rales,rhonchi or crepitation. No use of accessory muscles of respiration.  CARDIOVASCULAR: S1, S2 normal. No S3/S4.  ABDOMEN: Soft, nontender, nondistended. Bowel sounds present. No organomegaly or mass.  EXTREMITIES: No pedal edema, cyanosis, or clubbing.  NEUROLOGIC: Cranial nerves II through XII are intact. Muscle strength 5/5 in all extremities. Sensation intact.   PSYCHIATRIC: The patient is alert and oriented x 3.  SKIN:  No obvious rash, lesion, or ulcer.   LABORATORY PANEL:   CBC Recent Labs  Lab 10/10/18 1925  WBC 10.0  HGB 10.6*  HCT 32.8*  PLT 241  MCV 97.6  MCH 31.5  MCHC 32.3  RDW 13.3   ------------------------------------------------------------------------------------------------------------------  Chemistries  Recent Labs  Lab 10/10/18 1925  NA 139  K 3.9  CL 102  CO2 27  GLUCOSE 118*  BUN 22  CREATININE 4.01*  CALCIUM 8.5*  AST 26  ALT 11  ALKPHOS 219*  BILITOT 1.1   ------------------------------------------------------------------------------------------------------------------ estimated creatinine clearance is 15.6 mL/min (A) (by C-G formula based on SCr of 4.01 mg/dL (H)). ------------------------------------------------------------------------------------------------------------------ No results for input(s): TSH, T4TOTAL, T3FREE, THYROIDAB in the last 72 hours.  Invalid input(s): FREET3   Coagulation profile No results for input(s): INR, PROTIME in the last 168 hours. ------------------------------------------------------------------------------------------------------------------- No results for input(s): DDIMER in the last 72 hours. -------------------------------------------------------------------------------------------------------------------  Cardiac Enzymes Recent Labs  Lab 10/10/18 1925  TROPONINI 0.13*   ------------------------------------------------------------------------------------------------------------------ Invalid input(s): POCBNP  ---------------------------------------------------------------------------------------------------------------  Urinalysis    Component Value Date/Time   COLORURINE AMBER (A) 10/10/2018 1925   APPEARANCEUR HAZY (A) 10/10/2018 1925   APPEARANCEUR Turbid 08/29/2013 2049   LABSPEC 1.016 10/10/2018 1925   LABSPEC 1.015 08/29/2013 2049   PHURINE 8.0 10/10/2018 1925   GLUCOSEU NEGATIVE 10/10/2018 1925    GLUCOSEU 50 mg/dL 08/29/2013 2049   HGBUR NEGATIVE 10/10/2018 1925   BILIRUBINUR NEGATIVE 10/10/2018 1925   BILIRUBINUR Negative 08/29/2013 2049   KETONESUR NEGATIVE 10/10/2018 1925   PROTEINUR >=300 (A) 10/10/2018 1925   NITRITE NEGATIVE 10/10/2018 1925   LEUKOCYTESUR SMALL (A) 10/10/2018 1925   LEUKOCYTESUR 3+ 08/29/2013 2049     RADIOLOGY: Ct Abdomen Pelvis Wo Contrast  Result Date: 10/10/2018 CLINICAL DATA:  Nausea, vomiting and upper abdominal pain x5 days. EXAM: CT ABDOMEN AND PELVIS WITHOUT CONTRAST TECHNIQUE: Multidetector CT imaging of the abdomen and pelvis was performed following the standard protocol without IV contrast. COMPARISON:  09/29/2018 CT FINDINGS: Lower chest: Stable cardiomegaly without pericardial effusion or thickening. Coronary arteriosclerosis along the RCA. Dependent bibasilar atelectasis. Hepatobiliary: Mild surface nodularity of the liver compatible with morphologic changes of cirrhosis. Physiologically distended gallbladder without wall thickening or calculi. No biliary dilatation. Pancreas: Normal Spleen: Normal Adrenals/Urinary Tract: Normal bilateral adrenal glands. Cortical thinning of both kidneys with small cysts in the upper pole the left kidney and interpolar aspect, stable in appearance measuring approximately 8-9 mm each. No obstructive calculus nor hydroureteronephrosis. The urinary bladder is unremarkable for the degree of distention. Stomach/Bowel: Small hiatal hernia. The stomach is somewhat thickened in appearance but this is likely due to underdistention. The duodenal sweep and ligament of Treitz are normal. Slight diffuse  fluid distention of small bowel, nonspecific but may be seen in enteritis. No mechanical bowel obstruction. The colon is unremarkable. The appendix is not well visualized but no pericecal inflammation is identified. Vascular/Lymphatic: Nonaneurysmal thoracic aorta with atherosclerosis. No enlarged abdominal or pelvic lymph nodes.  Reproductive: Prostate is enlarged measuring 5.7 x 5 x 6.1 cm impressing upon the base of the bladder. Other: No free air nor free fluid. Musculoskeletal: No acute nor suspicious osseous lesions. Degenerative disc disease T8 through T10 and at T11-12. IMPRESSION: 1. Slight fluid distention of small bowel loops without mechanical bowel obstruction. Findings may represent a small bowel enteritis. 2. Stable cardiomegaly with coronary arteriosclerosis. 3. Morphologic changes of cirrhosis without space-occupying mass nor ascites. No splenomegaly. 4. Bilateral renal cortical atrophy with stable cyst the left upper pole measuring 9 mm and exophytic off the lateral aspect of the interpolar kidney measuring 9 mm. Electronically Signed   By: Ashley Royalty M.D.   On: 10/10/2018 21:33    EKG: Orders placed or performed during the hospital encounter of 10/10/18  . ED EKG  . ED EKG    IMPRESSION AND PLAN:  1.  Acute gastroenteritis, of unclear etiology, possibly viral in nature.  Continue supportive measures with antinausea medications and pain medications as needed.  Will check stool studies if patient will develop diarrhea. 2.  Elevated troponin level, at 0.13.  This is close to baseline for the patient and is likely related to end-stage renal disease.  We will continue to monitor patient on telemetry and follow troponin levels to rule out ACS. 3.  End-stage renal disease, continue hemodialysis per nephrology. 4.  Diabetes type 2.  Will monitor blood sugars before meals and at bedtime and use insulin treatment during the hospital stay. 5.  Inability to ambulate status post recent S1/S2 fracture.  PT/OT is consulted for further evaluation and treatment. 6.  Hypertension, stable, resume home medications. 7.  Hyperlipidemia, on statin.  All the records are reviewed and case discussed with ED provider. Management plans discussed with the patient, family and they are in agreement.  CODE STATUS: Full Code Status  History    Date Active Date Inactive Code Status Order ID Comments User Context   09/29/2018 2025 10/01/2018 1414 Full Code [id number]  Vaughan Basta, MD Inpatient   11/25/2017 1939 11/28/2017 2014 Full Code [id number]  Dustin Flock, MD ED   09/05/2017 1508 09/05/2017 1955 Full Code [id number]  Schnier, Dolores Lory, MD Inpatient   08/02/2017 1834 08/06/2017 1959 Full Code [id number]  Loletha Grayer, MD ED   03/27/2017 0542 04/04/2017 2141 Full Code [id number]  Saundra Shelling, MD Inpatient   02/15/2017 0911 02/19/2017 1804 Full Code [id number]  Saundra Shelling, MD Inpatient   01/31/2017 0236 02/02/2017 2022 Full Code [id number]  Saundra Shelling, MD ED       TOTAL TIME TAKING CARE OF THIS PATIENT: 50 minutes.    Amelia Jo M.D on 10/10/2018 at 11:43 PM  Between 7am to 6pm - Pager - [phone]  After 6pm go to [email or web address] - password [email or web address] [facility] [place] [facility]  [phone]  CC: Primary care physician; [name], MD

## 2018-10-10 NOTE — ED Notes (Signed)
Call to floor, Furniture conservator/restorer, RN will call this RN back

## 2018-10-10 NOTE — ED Provider Notes (Signed)
[facility] Emergency Department Provider Note       Time seen: ----------------------------------------- 8:24 PM on 10/10/2018 -----------------------------------------   I have reviewed the triage vital signs and the nursing notes.  HISTORY   Chief Complaint Emesis    HPI Martin Gilbert is a 77 y.o. male with a history of anemia, chronic kidney disease on dialysis, C. difficile colitis, diabetes, GERD, hypertension, hyperlipidemia who presents to the ED for nausea vomiting with upper abdominal pain for the past 5 days and 3 days of vomiting.  He reports surgery 2 weeks ago and is back, states he is no longer able to stand with his left leg.  Past Medical History:  Diagnosis Date  . Anemia   . Back pain   . BPH (benign prostatic hyperplasia)   . CKD (chronic kidney disease), stage IV ([place])   . Clostridium difficile colitis   . Diabetes mellitus without complication ([place])   . GERD (gastroesophageal reflux disease)   . HTN (hypertension)   . Hyperlipidemia     Patient Active Problem List   Diagnosis Date Noted  . Sacral fracture ([place]) 09/29/2018  . Complication of arteriovenous dialysis fistula 09/01/2017  . ESRD on dialysis ([place]) 06/03/2017  . Colitis 03/27/2017  . Demand ischemia ([place]) 03/27/2017  . Enteritis due to Clostridium difficile   . Protein-calorie malnutrition, severe 02/18/2017  . C. difficile colitis 02/15/2017  . UTI (urinary tract infection) 02/02/2017  . E coli infection 02/02/2017  . Lactic acidosis 02/02/2017  . Diabetic neuropathy ([place]) 02/02/2017  . Left-sided chest wall pain 02/02/2017  . Fall 02/02/2017  . Generalized weakness 02/02/2017  . Pink eye, left 02/02/2017  . Leukocytosis 02/02/2017  . Anemia of chronic disease 02/02/2017  . Nausea 02/02/2017  . Sepsis ([place]) 01/31/2017  . Essential hypertension 11/28/2015  . Pain in the chest 02/15/2015  . Neck pain 02/15/2015  . Diabetes type 2, uncontrolled  ([place]) 02/15/2015  . Frequent headaches 02/15/2015    Past Surgical History:  Procedure Laterality Date  . A/V FISTULAGRAM Left 04/20/2018   Procedure: A/V FISTULAGRAM;  Surgeon: Algernon Huxley, MD;  Location: [place] [facility];  Service: Cardiovascular;  Laterality: Left;  . AV FISTULA PLACEMENT Left 06/13/2017   Procedure: ARTERIOVENOUS (AV) FISTULA CREATION ( RADIAL CEPHALIC );  Surgeon: Katha Cabal, MD;  Location: [facility];  Service: Vascular;  Laterality: Left;  . CATARACT EXTRACTION     one eye not sure which  . DIALYSIS/PERMA CATHETER INSERTION N/A 04/03/2017   Procedure: Dialysis/Perma Catheter Insertion;  Surgeon: Algernon Huxley, MD;  Location: [place] [facility];  Service: Cardiovascular;  Laterality: N/A;  . DIALYSIS/PERMA CATHETER INSERTION N/A 08/06/2017   Procedure: DIALYSIS/PERMA CATHETER INSERTION;  Surgeon: Algernon Huxley, MD;  Location: [place] [facility];  Service: Cardiovascular;  Laterality: N/A;  . DIALYSIS/PERMA CATHETER REMOVAL N/A 08/04/2017   Procedure: DIALYSIS/PERMA CATHETER REMOVAL;  Surgeon: Algernon Huxley, MD;  Location: [place] [facility];  Service: Cardiovascular;  Laterality: N/A;  . DIALYSIS/PERMA CATHETER REMOVAL N/A 12/31/2017   Procedure: DIALYSIS/PERMA CATHETER REMOVAL;  Surgeon: Algernon Huxley, MD;  Location: [place] [facility];  Service: Cardiovascular;  Laterality: N/A;  . hernia repair    . SACROPLASTY N/A 09/30/2018   Procedure: SACROPLASTY;  Surgeon: Hessie Knows, MD;  Location: [facility];  Service: Orthopedics;  Laterality: N/A;  . UPPER EXTREMITY ANGIOGRAPHY Left 09/05/2017   Procedure: Upper Extremity Angiography;  Surgeon: Katha Cabal, MD;  Location: [place]  [facility];  Service: Cardiovascular;  Laterality: Left;    Allergies Patient has no known allergies.  Social History Social History   Tobacco Use  . Smoking status: Never Smoker  . Smokeless tobacco: Never Used  Substance Use Topics  . Alcohol use: No    Comment: Drank  in the past, but has stopped for several years.   . Drug use: No   Review of Systems Constitutional: Negative for fever. Cardiovascular: Negative for chest pain. Respiratory: Negative for shortness of breath. Gastrointestinal: Positive for abdominal pain, vomiting Musculoskeletal: Positive for back pain Skin: Negative for rash. Neurological: Negative for headaches, positive for weakness  All systems negative/normal/unremarkable except as stated in the HPI  ____________________________________________   PHYSICAL EXAM:  VITAL SIGNS: ED Triage Vitals  Enc Vitals Group     BP 10/10/18 1917 (!) 143/48     Pulse Rate 10/10/18 1917 65     Resp 10/10/18 1917 16     Temp 10/10/18 1917 98.3 F (36.8 C)     Temp Source 10/10/18 1917 Oral     SpO2 10/10/18 1917 96 %     Weight 10/10/18 1912 160 lb (72.6 kg)     Height 10/10/18 1912 5\' 11"  (1.803 m)     Head Circumference --      Peak Flow --      Pain Score 10/10/18 1911 10     Pain Loc --      Pain Edu? --      Excl. in [place]? --    Constitutional: Alert, chronically ill-appearing, no distress Eyes: Conjunctivae are normal. Normal extraocular movements. ENT   Head: Normocephalic and atraumatic.   Nose: No congestion/rhinnorhea.   Mouth/Throat: Mucous membranes are moist.   Neck: No stridor. Cardiovascular: Normal rate, regular rhythm. No murmurs, rubs, or gallops. Respiratory: Normal respiratory effort without tachypnea nor retractions. Breath sounds are clear and equal bilaterally. No wheezes/rales/rhonchi. Gastrointestinal: Nonfocal tenderness, no rebound or guarding.  Normal bowel sounds.  Distention. Musculoskeletal: Nontender with normal range of motion in extremities. No lower extremity tenderness nor edema. Neurologic:  Normal speech and language. No gross focal neurologic deficits are appreciated.  Skin:  Skin is warm, dry and intact. No rash noted. Psychiatric: Mood and affect are normal. Speech and behavior  are normal.  ____________________________________________  EKG: Interpreted by me.  Sinus rhythm first-degree AV block, rate is 66 bpm, right bundle branch block, left anterior fascicular block, long QT  ____________________________________________  ED COURSE:  As part of my medical decision making, I reviewed the following data within the [place] History obtained from family if available, nursing notes, old chart and ekg, as well as notes from prior ED visits. Patient presented for nausea with vomiting and abdominal pain, we will assess with labs and imaging as indicated at this time.   Procedures ____________________________________________   LABS (pertinent positives/negatives)  Labs Reviewed  COMPREHENSIVE METABOLIC PANEL - Abnormal; Notable for the following components:      Result Value   Glucose, Bld 118 (*)    Creatinine, Ser 4.01 (*)    Calcium 8.5 (*)    Albumin 3.0 (*)    Alkaline Phosphatase 219 (*)    GFR calc non Af Amer 13 (*)    GFR calc Af Amer 15 (*)    All other components within normal limits  CBC - Abnormal; Notable for the following components:   RBC 3.36 (*)    Hemoglobin 10.6 (*)    HCT  32.8 (*)    All other components within normal limits  URINALYSIS, COMPLETE (UACMP) WITH MICROSCOPIC - Abnormal; Notable for the following components:   Color, Urine AMBER (*)    APPearance HAZY (*)    Protein, ur >=300 (*)    Leukocytes, UA SMALL (*)    WBC, UA >50 (*)    All other components within normal limits  TROPONIN I - Abnormal; Notable for the following components:   Troponin I 0.13 (*)    All other components within normal limits  GLUCOSE, CAPILLARY - Abnormal; Notable for the following components:   Glucose-Capillary 108 (*)    All other components within normal limits  LIPASE, BLOOD  CBG MONITORING, ED    RADIOLOGY Images were viewed by me  CT of the abdomen pelvis without contrast IMPRESSION: 1. Slight fluid distention of  small bowel loops without mechanical bowel obstruction. Findings may represent a small bowel enteritis. 2. Stable cardiomegaly with coronary arteriosclerosis. 3. Morphologic changes of cirrhosis without space-occupying mass nor ascites. No splenomegaly. 4. Bilateral renal cortical atrophy with stable cyst the left upper pole measuring 9 mm and exophytic off the lateral aspect of the interpolar kidney measuring 9 mm. ____________________________________________  DIFFERENTIAL DIAGNOSIS   Gastritis, gastroenteritis, dehydration, electrolyte abnormality, renal failure  FINAL ASSESSMENT AND PLAN  Abdominal pain, vomiting   Plan: The patient had presented for abdominal pain and vomiting of uncertain etiology. Patient's labs did not reveal any acute process. Patient's imaging resembled enteritis.  This could certainly be viral, we gave several doses of antiemetics without significant improvement.  I will discuss with the hospitalist for admission.  Laurence Aly, MD   Note: This note was generated in part or whole with voice recognition software. Voice recognition is usually quite accurate but there are transcription errors that can and very often do occur. I apologize for any typographical errors that were not detected and corrected.     Earleen Newport, MD 10/10/18 2228

## 2018-10-10 NOTE — ED Notes (Signed)
Daughter's leaving bedside att, for updates call [name], daughter, [phone], speaks [place]

## 2018-10-10 NOTE — ED Notes (Signed)
Patient transported to CT 

## 2018-10-10 NOTE — ED Triage Notes (Signed)
Pt arrives POV to triage with c/o N/V and upper abdominal pain x 5 days with x 3 days of vomiting. Pt reports sugery x 2 weeks on his back. Pt reports that he is no longer able to stand with his left leg.

## 2018-10-11 ENCOUNTER — Observation Stay: Payer: Medicare Other

## 2018-10-11 DIAGNOSIS — K529 Noninfective gastroenteritis and colitis, unspecified: Secondary | ICD-10-CM | POA: Diagnosis present

## 2018-10-11 LAB — BASIC METABOLIC PANEL
ANION GAP: 8 (ref 5–15)
BUN: 28 mg/dL — ABNORMAL HIGH (ref 8–23)
CHLORIDE: 103 mmol/L (ref 98–111)
CO2: 30 mmol/L (ref 22–32)
Calcium: 8.3 mg/dL — ABNORMAL LOW (ref 8.9–10.3)
Creatinine, Ser: 4.56 mg/dL — ABNORMAL HIGH (ref 0.61–1.24)
GFR calc Af Amer: 13 mL/min — ABNORMAL LOW (ref >60)
GFR calc non Af Amer: 11 mL/min — ABNORMAL LOW (ref >60)
GLUCOSE: 91 mg/dL (ref 70–99)
Potassium: 4.1 mmol/L (ref 3.5–5.1)
SODIUM: 141 mmol/L (ref 135–145)

## 2018-10-11 LAB — CBC
HEMATOCRIT: 31.1 % — AB (ref 39.0–52.0)
HEMOGLOBIN: 10.1 g/dL — AB (ref 13.0–17.0)
MCH: 32.3 pg (ref 26.0–34.0)
MCHC: 32.5 g/dL (ref 30.0–36.0)
MCV: 99.4 fL (ref 80.0–100.0)
Platelets: 219 10*3/uL (ref 150–400)
RBC: 3.13 MIL/uL — AB (ref 4.22–5.81)
RDW: 13.6 % (ref 11.5–15.5)
WBC: 8.3 10*3/uL (ref 4.0–10.5)
nRBC: 0 % (ref 0.0–0.2)

## 2018-10-11 LAB — GLUCOSE, CAPILLARY
GLUCOSE-CAPILLARY: 102 mg/dL — AB (ref 70–99)
GLUCOSE-CAPILLARY: 124 mg/dL — AB (ref 70–99)
Glucose-Capillary: 107 mg/dL — ABNORMAL HIGH (ref 70–99)
Glucose-Capillary: 84 mg/dL (ref 70–99)
Glucose-Capillary: 95 mg/dL (ref 70–99)

## 2018-10-11 LAB — TROPONIN I: TROPONIN I: 0.14 ng/mL — AB (ref <0.03)

## 2018-10-11 MED ORDER — HYDRALAZINE HCL 50 MG PO TABS
50.0000 mg | ORAL_TABLET | Freq: Three times a day (TID) | ORAL | Status: DC
  Administered 2018-10-11 – 2018-10-14 (×8): 50 mg via ORAL
  Filled 2018-10-11 (×9): qty 1

## 2018-10-11 MED ORDER — ALLOPURINOL 100 MG PO TABS
100.0000 mg | ORAL_TABLET | Freq: Every day | ORAL | Status: DC
  Administered 2018-10-11 – 2018-10-14 (×4): 100 mg via ORAL
  Filled 2018-10-11 (×4): qty 1

## 2018-10-11 MED ORDER — ACETAMINOPHEN 325 MG PO TABS
650.0000 mg | ORAL_TABLET | Freq: Four times a day (QID) | ORAL | Status: DC | PRN
  Administered 2018-10-12: 650 mg via ORAL
  Filled 2018-10-11: qty 2

## 2018-10-11 MED ORDER — HEPARIN SODIUM (PORCINE) 5000 UNIT/ML IJ SOLN
5000.0000 [IU] | Freq: Three times a day (TID) | INTRAMUSCULAR | Status: DC
  Administered 2018-10-11 – 2018-10-14 (×9): 5000 [IU] via SUBCUTANEOUS
  Filled 2018-10-11 (×9): qty 1

## 2018-10-11 MED ORDER — DOCUSATE SODIUM 100 MG PO CAPS
100.0000 mg | ORAL_CAPSULE | Freq: Two times a day (BID) | ORAL | Status: DC
  Administered 2018-10-11 – 2018-10-14 (×6): 100 mg via ORAL
  Filled 2018-10-11 (×6): qty 1

## 2018-10-11 MED ORDER — ASPIRIN EC 81 MG PO TBEC
81.0000 mg | DELAYED_RELEASE_TABLET | Freq: Every day | ORAL | Status: DC
  Administered 2018-10-11 – 2018-10-14 (×4): 81 mg via ORAL
  Filled 2018-10-11 (×4): qty 1

## 2018-10-11 MED ORDER — VITAMIN D 1000 UNITS PO TABS
1000.0000 [IU] | ORAL_TABLET | Freq: Every day | ORAL | Status: DC
  Administered 2018-10-11 – 2018-10-14 (×4): 1000 [IU] via ORAL
  Filled 2018-10-11 (×4): qty 1

## 2018-10-11 MED ORDER — BISACODYL 5 MG PO TBEC: 5.0000 mg | DELAYED_RELEASE_TABLET | Freq: Every day | ORAL | Status: DC | PRN

## 2018-10-11 MED ORDER — METOPROLOL SUCCINATE ER 25 MG PO TB24
25.0000 mg | ORAL_TABLET | Freq: Every day | ORAL | Status: DC
  Administered 2018-10-11 – 2018-10-14 (×4): 25 mg via ORAL
  Filled 2018-10-11 (×4): qty 1

## 2018-10-11 MED ORDER — ONDANSETRON HCL 4 MG PO TABS
4.0000 mg | ORAL_TABLET | Freq: Four times a day (QID) | ORAL | Status: DC | PRN
  Administered 2018-10-12: 4 mg via ORAL
  Filled 2018-10-11: qty 1

## 2018-10-11 MED ORDER — SODIUM BICARBONATE 650 MG PO TABS
650.0000 mg | ORAL_TABLET | Freq: Two times a day (BID) | ORAL | Status: DC
  Administered 2018-10-11 – 2018-10-14 (×7): 650 mg via ORAL
  Filled 2018-10-11 (×7): qty 1

## 2018-10-11 MED ORDER — INSULIN ASPART 100 UNIT/ML ~~LOC~~ SOLN
0.0000 [IU] | Freq: Three times a day (TID) | SUBCUTANEOUS | Status: DC
  Administered 2018-10-13: 2 [IU] via SUBCUTANEOUS
  Filled 2018-10-11 (×2): qty 1

## 2018-10-11 MED ORDER — TRAZODONE HCL 50 MG PO TABS
25.0000 mg | ORAL_TABLET | Freq: Every evening | ORAL | Status: DC | PRN
  Administered 2018-10-11: 25 mg via ORAL
  Filled 2018-10-11: qty 1

## 2018-10-11 MED ORDER — MELATONIN 5 MG PO TABS
5.0000 mg | ORAL_TABLET | Freq: Every day | ORAL | Status: DC
  Administered 2018-10-11 – 2018-10-13 (×3): 5 mg via ORAL
  Filled 2018-10-11 (×5): qty 1

## 2018-10-11 MED ORDER — ATORVASTATIN CALCIUM 20 MG PO TABS
20.0000 mg | ORAL_TABLET | Freq: Every day | ORAL | Status: DC
  Administered 2018-10-11 – 2018-10-13 (×4): 20 mg via ORAL
  Filled 2018-10-11 (×4): qty 1

## 2018-10-11 MED ORDER — PANTOPRAZOLE SODIUM 40 MG PO TBEC
40.0000 mg | DELAYED_RELEASE_TABLET | Freq: Every day | ORAL | Status: DC
  Administered 2018-10-11 – 2018-10-14 (×4): 40 mg via ORAL
  Filled 2018-10-11 (×4): qty 1

## 2018-10-11 MED ORDER — HYDROCODONE-ACETAMINOPHEN 5-325 MG PO TABS
1.0000 | ORAL_TABLET | ORAL | Status: DC | PRN
  Administered 2018-10-11 – 2018-10-12 (×3): 2 via ORAL
  Filled 2018-10-11 (×3): qty 2

## 2018-10-11 MED ORDER — INSULIN ASPART 100 UNIT/ML ~~LOC~~ SOLN: 0.0000 [IU] | Freq: Every day | SUBCUTANEOUS | Status: DC

## 2018-10-11 MED ORDER — FERROUS SULFATE 325 (65 FE) MG PO TABS
325.0000 mg | ORAL_TABLET | Freq: Two times a day (BID) | ORAL | Status: DC
  Administered 2018-10-11 – 2018-10-14 (×6): 325 mg via ORAL
  Filled 2018-10-11 (×6): qty 1

## 2018-10-11 MED ORDER — TAMSULOSIN HCL 0.4 MG PO CAPS
0.4000 mg | ORAL_CAPSULE | Freq: Every day | ORAL | Status: DC
  Administered 2018-10-11 – 2018-10-13 (×3): 0.4 mg via ORAL
  Filled 2018-10-11 (×3): qty 1

## 2018-10-11 MED ORDER — ONDANSETRON HCL 4 MG/2ML IJ SOLN
4.0000 mg | Freq: Four times a day (QID) | INTRAMUSCULAR | Status: DC | PRN
  Administered 2018-10-12: 4 mg via INTRAVENOUS
  Filled 2018-10-11 (×2): qty 2

## 2018-10-11 MED ORDER — RENA-VITE PO TABS
1.0000 | ORAL_TABLET | Freq: Every day | ORAL | Status: DC
  Administered 2018-10-11 – 2018-10-13 (×3): 1 via ORAL
  Filled 2018-10-11 (×3): qty 1

## 2018-10-11 MED ORDER — ACETAMINOPHEN 650 MG RE SUPP: 650.0000 mg | Freq: Four times a day (QID) | RECTAL | Status: DC | PRN

## 2018-10-11 NOTE — Care Management Obs Status (Signed)
[facility] NOTIFICATION   Patient Details  Name: Martin Gilbert MRN: [id number] Date of Birth: 10-21-1941   Medicare Observation Status Notification Given:  Yes    Ilona Colley A Ouita Nish, RN 10/11/2018, 11:18 AM

## 2018-10-11 NOTE — Progress Notes (Signed)
[facility]  ROUNDING NOTE   Subjective:  Patient reports significant back pain and inability to bear weight on his left lower extremity. Underwent recent kyphoplasty. He did complete hemodialysis yesterday as an outpatient.   Objective:  Vital signs in last 24 hours:  Temp:  [98.3 F (36.8 C)-99 F (37.2 C)] 99 F (37.2 C) (10/27 0554) Pulse Rate:  [61-67] 65 (10/27 1016) Resp:  [16-20] 20 (10/27 0554) BP: (121-166)/(48-61) 133/58 (10/27 1016) SpO2:  [92 %-97 %] 93 % (10/27 0554) Weight:  [72.6 kg-80.8 kg] 80.8 kg (10/27 0554)  Weight change:  Filed Weights   10/10/18 1912 10/10/18 2254 10/11/18 0554  Weight: 72.6 kg 79 kg 80.8 kg    Intake/Output: No intake/output data recorded.   Intake/Output this shift:  Total I/O In: 120 [P.O.:120] Out: -   Physical Exam: General: No acute distress  Head: Normocephalic, atraumatic. Moist oral mucosal membranes  Eyes: Anicteric  Neck: Supple, trachea midline  Lungs:  Clear to auscultation, normal effort  Heart: S1S2 no rubs  Abdomen:  Soft, nontender, bowel sounds present  Extremities: No peripheral edema.  Neurologic: Awake, alert, following commands  Skin: No lesions  Access: L AVF    Basic Metabolic Panel: Recent Labs  Lab 10/10/18 1925 10/11/18 0434  NA 139 141  K 3.9 4.1  CL 102 103  CO2 27 30  GLUCOSE 118* 91  BUN 22 28*  CREATININE 4.01* 4.56*  CALCIUM 8.5* 8.3*    Liver Function Tests: Recent Labs  Lab 10/10/18 1925  AST 26  ALT 11  ALKPHOS 219*  BILITOT 1.1  PROT 6.7  ALBUMIN 3.0*   Recent Labs  Lab 10/10/18 1925  LIPASE 36   No results for input(s): AMMONIA in the last 168 hours.  CBC: Recent Labs  Lab 10/10/18 1925 10/11/18 0434  WBC 10.0 8.3  HGB 10.6* 10.1*  HCT 32.8* 31.1*  MCV 97.6 99.4  PLT 241 219    Cardiac Enzymes: Recent Labs  Lab 10/10/18 1925 10/11/18 0434  TROPONINI 0.13* 0.14*    BNP: Invalid input(s): POCBNP  CBG: Recent Labs  Lab  10/10/18 1923 10/11/18 0053 10/11/18 0748 10/11/18 1152  GLUCAP 108* 95 102* 84    Microbiology: Results for orders placed or performed during the hospital encounter of 09/29/18  MRSA PCR Screening     Status: None   Collection Time: 09/29/18  9:54 PM  Result Value Ref Range Status   MRSA by PCR NEGATIVE NEGATIVE Final    Comment:        The GeneXpert MRSA Assay (FDA approved for NASAL specimens only), is one component of a comprehensive MRSA colonization surveillance program. It is not intended to diagnose MRSA infection nor to guide or monitor treatment for MRSA infections. Performed at [facility], [address] [place], [place] [address]     Coagulation Studies: No results for input(s): LABPROT, INR in the last 72 hours.  Urinalysis: Recent Labs    10/10/18 [place] 1.016  PHURINE 8.0  GLUCOSEU NEGATIVE  HGBUR NEGATIVE  BILIRUBINUR NEGATIVE  KETONESUR NEGATIVE  PROTEINUR >=300*  NITRITE NEGATIVE  LEUKOCYTESUR SMALL*      Imaging: Ct Abdomen Pelvis Wo Contrast  Result Date: 10/10/2018 CLINICAL DATA:  Nausea, vomiting and upper abdominal pain x5 days. EXAM: CT ABDOMEN AND PELVIS WITHOUT CONTRAST TECHNIQUE: Multidetector CT imaging of the abdomen and pelvis was performed following the standard protocol without IV contrast. COMPARISON:  09/29/2018 CT FINDINGS: Lower  chest: Stable cardiomegaly without pericardial effusion or thickening. Coronary arteriosclerosis along the RCA. Dependent bibasilar atelectasis. Hepatobiliary: Mild surface nodularity of the liver compatible with morphologic changes of cirrhosis. Physiologically distended gallbladder without wall thickening or calculi. No biliary dilatation. Pancreas: Normal Spleen: Normal Adrenals/Urinary Tract: Normal bilateral adrenal glands. Cortical thinning of both kidneys with small cysts in the upper pole the left kidney and interpolar aspect, stable in appearance measuring  approximately 8-9 mm each. No obstructive calculus nor hydroureteronephrosis. The urinary bladder is unremarkable for the degree of distention. Stomach/Bowel: Small hiatal hernia. The stomach is somewhat thickened in appearance but this is likely due to underdistention. The duodenal sweep and ligament of Treitz are normal. Slight diffuse fluid distention of small bowel, nonspecific but may be seen in enteritis. No mechanical bowel obstruction. The colon is unremarkable. The appendix is not well visualized but no pericecal inflammation is identified. Vascular/Lymphatic: Nonaneurysmal thoracic aorta with atherosclerosis. No enlarged abdominal or pelvic lymph nodes. Reproductive: Prostate is enlarged measuring 5.7 x 5 x 6.1 cm impressing upon the base of the bladder. Other: No free air nor free fluid. Musculoskeletal: No acute nor suspicious osseous lesions. Degenerative disc disease T8 through T10 and at T11-12. IMPRESSION: 1. Slight fluid distention of small bowel loops without mechanical bowel obstruction. Findings may represent a small bowel enteritis. 2. Stable cardiomegaly with coronary arteriosclerosis. 3. Morphologic changes of cirrhosis without space-occupying mass nor ascites. No splenomegaly. 4. Bilateral renal cortical atrophy with stable cyst the left upper pole measuring 9 mm and exophytic off the lateral aspect of the interpolar kidney measuring 9 mm. Electronically Signed   By: Ashley Royalty M.D.   On: 10/10/2018 21:33   Dg Lumbar Spine 2-3 Views  Result Date: 10/11/2018 CLINICAL DATA:  kyphoplasty 09-30-18. Patient complaining of back pain and unable to walk or bear weight on the left leg. He is status post kyphoplasty earlier this month with Dr. Rudene Christians. EXAM: LUMBAR SPINE - 2-3 VIEW COMPARISON:  CT, 10/10/2018. FINDINGS: Kyphoplasty cement lies in the iliac bones bilaterally adjacent to the SI joints, stable from the previous day's CT. No acute fracture. All lumbar vertebra are normal in height. No  spondylolisthesis. No bone lesion. Lumbar discs are well maintained in height. There are small endplate osteophytes throughout the lumbar spine and lower thoracic spine. Bones are demineralized. There is generalized increased bowel gas mild prominence of multiple loops of small bowel. This suggests a possible mild adynamic ileus. IMPRESSION: 1. Kyphoplasty cement within the posterior iliac bones adjacent to the SI joints, stable from the previous day's CT. 2. No acute fracture. Electronically Signed   By: Lajean Manes M.D.   On: 10/11/2018 11:04     Medications:    . allopurinol  100 mg Oral Daily  . aspirin EC  81 mg Oral Daily  . atorvastatin  20 mg Oral Daily  . cholecalciferol  1,000 Units Oral Daily  . docusate sodium  100 mg Oral BID  . ferrous sulfate  325 mg Oral BID WC  . heparin  5,000 Units Subcutaneous Q8H  . hydrALAZINE  50 mg Oral TID  . insulin aspart  0-5 Units Subcutaneous QHS  . insulin aspart  0-9 Units Subcutaneous TID WC  . Melatonin  5 mg Oral QHS  . metoprolol succinate  25 mg Oral Daily  . multivitamin  1 tablet Oral QHS  . pantoprazole  40 mg Oral Daily  . sodium bicarbonate  650 mg Oral BID  . tamsulosin  0.4 mg  Oral QPC supper   acetaminophen **OR** acetaminophen, bisacodyl, HYDROcodone-acetaminophen, ondansetron **OR** ondansetron (ZOFRAN) IV, traZODone  Assessment/ Plan:  77 y.o. male with end-stage renal disease, diabetes, hypertension, BPH, anemia of CKD, hx of kyphoplasty, comes in with recurrent back pain.   1.  Hypertension. 2.  Anemia of CKD.  3.  Secondary Hyperparathryoidism.   Plan: No urgent indication for dialysis at the moment.  Currently under observation.  If he ends up being admitted he would next need dialysis on Tuesday.  But for now maintain the patient on hydralazine and metoprolol for blood pressure support.  No indication for Epogen at the moment.   LOS: 1 Jashayla Glatfelter 10/27/20191:27 PM

## 2018-10-11 NOTE — Progress Notes (Signed)
[facility] at [place] NAME: Martin Gilbert    MR#:  [id number]  DATE OF BIRTH:  1941-10-15  SUBJECTIVE:  Patient complaining of back pain and unable to walk or bear weight on the left leg.  He is status post kyphoplasty earlier this month with Dr. Rudene Christians. Nausea and vomiting have improved and he would like to try a diet. No falls since surgery  REVIEW OF SYSTEMS:    Review of Systems  Constitutional: Negative for fever, chills weight loss HENT: Negative for ear pain, nosebleeds, congestion, facial swelling, rhinorrhea, neck pain, neck stiffness and ear discharge.   Respiratory: Negative for cough, shortness of breath, wheezing  Cardiovascular: Negative for chest pain, palpitations and leg swelling.  Gastrointestinal: Negative for heartburn, abdominal pain, vomiting, diarrhea or consitpation Genitourinary: Negative for dysuria, urgency, frequency, hematuria Musculoskeletal: As it of for back pain and left leg weakness Neurological: Negative for dizziness, seizures, syncope, focal weakness,  numbness and headaches.  Hematological: Does not bruise/bleed easily.  Psychiatric/Behavioral: Negative for hallucinations, confusion, dysphoric mood    Tolerating Diet: yes      DRUG ALLERGIES:  No Known Allergies  VITALS:  Blood pressure 121/61, pulse 67, temperature 99 F (37.2 C), resp. rate 20, height 5\' 11"  (1.803 m), weight 80.8 kg, SpO2 93 %.  PHYSICAL EXAMINATION:  Constitutional: Appears well-developed and well-nourished. No distress. HENT: Normocephalic. Marland Kitchen Oropharynx is clear and moist.  Eyes: Conjunctivae and EOM are normal. PERRLA, no scleral icterus.  Neck: Normal ROM. Neck supple. No JVD. No tracheal deviation. CVS: RRR, S1/S2 +, no murmurs, no gallops, no carotid bruit.  Pulmonary: Effort and breath sounds normal, no stridor, rhonchi, wheezes, rales.  Abdominal: Soft. BS +,  no distension, tenderness, rebound or guarding.   Musculoskeletal: Omitted range of motion and severe tenderness lower lumbar spine and sacral area no edema and no tenderness.  Neuro: Alert. CN 2-12 grossly intact. No focal deficits. Skin: Skin is warm and dry. No rash noted. Psychiatric: Normal mood and affect.      LABORATORY PANEL:   CBC Recent Labs  Lab 10/11/18 0434  WBC 8.3  HGB 10.1*  HCT 31.1*  PLT 219   ------------------------------------------------------------------------------------------------------------------  Chemistries  Recent Labs  Lab 10/10/18 1925 10/11/18 0434  NA 139 141  K 3.9 4.1  CL 102 103  CO2 27 30  GLUCOSE 118* 91  BUN 22 28*  CREATININE 4.01* 4.56*  CALCIUM 8.5* 8.3*  AST 26  --   ALT 11  --   ALKPHOS 219*  --   BILITOT 1.1  --    ------------------------------------------------------------------------------------------------------------------  Cardiac Enzymes Recent Labs  Lab 10/10/18 1925 10/11/18 0434  TROPONINI 0.13* 0.14*   ------------------------------------------------------------------------------------------------------------------  RADIOLOGY:  Ct Abdomen Pelvis Wo Contrast  Result Date: 10/10/2018 CLINICAL DATA:  Nausea, vomiting and upper abdominal pain x5 days. EXAM: CT ABDOMEN AND PELVIS WITHOUT CONTRAST TECHNIQUE: Multidetector CT imaging of the abdomen and pelvis was performed following the standard protocol without IV contrast. COMPARISON:  09/29/2018 CT FINDINGS: Lower chest: Stable cardiomegaly without pericardial effusion or thickening. Coronary arteriosclerosis along the RCA. Dependent bibasilar atelectasis. Hepatobiliary: Mild surface nodularity of the liver compatible with morphologic changes of cirrhosis. Physiologically distended gallbladder without wall thickening or calculi. No biliary dilatation. Pancreas: Normal Spleen: Normal Adrenals/Urinary Tract: Normal bilateral adrenal glands. Cortical thinning of both kidneys with small cysts in the upper pole  the left kidney and interpolar aspect, stable in appearance measuring approximately 8-9 mm each. No  obstructive calculus nor hydroureteronephrosis. The urinary bladder is unremarkable for the degree of distention. Stomach/Bowel: Small hiatal hernia. The stomach is somewhat thickened in appearance but this is likely due to underdistention. The duodenal sweep and ligament of Treitz are normal. Slight diffuse fluid distention of small bowel, nonspecific but may be seen in enteritis. No mechanical bowel obstruction. The colon is unremarkable. The appendix is not well visualized but no pericecal inflammation is identified. Vascular/Lymphatic: Nonaneurysmal thoracic aorta with atherosclerosis. No enlarged abdominal or pelvic lymph nodes. Reproductive: Prostate is enlarged measuring 5.7 x 5 x 6.1 cm impressing upon the base of the bladder. Other: No free air nor free fluid. Musculoskeletal: No acute nor suspicious osseous lesions. Degenerative disc disease T8 through T10 and at T11-12. IMPRESSION: 1. Slight fluid distention of small bowel loops without mechanical bowel obstruction. Findings may represent a small bowel enteritis. 2. Stable cardiomegaly with coronary arteriosclerosis. 3. Morphologic changes of cirrhosis without space-occupying mass nor ascites. No splenomegaly. 4. Bilateral renal cortical atrophy with stable cyst the left upper pole measuring 9 mm and exophytic off the lateral aspect of the interpolar kidney measuring 9 mm. Electronically Signed   By: Ashley Royalty M.D.   On: 10/10/2018 21:33     ASSESSMENT AND PLAN:   77 year old male with end-stage renal disease on hemodialysis, recent closed fracture of the sacrum and kyphoplasty essential hypertension who presents to the emergency room due to generalized weakness with nausea and vomiting and unable to walk/bear weight on the left leg.   1.  Nausea/vomiting with acute viral gastroenteritis which has improved Advance diet to renal diet CT findings  suggest small bowel enteritis, however it appears patient symptoms have improved   2.  Elevated troponin: Troponins are flat and this is due to poor renal clearance.  Patient has ruled out for ACS   3.  End-stage renal disease on hemodialysis: Continue Tuesday, Thursday and Saturday dialysis  4.  Recent sacral fracture status post kyphoplasty and inability to ambulate: PT consultation requested Consider Ortho consult after PT evaluation if needed  5.  Diabetes: Continue current regimen with sliding scale/ADA diet  6.  Hyperlipidemia: Continue statin  7.  History of gout: Continue allopurinol  8.  Essential hypertension: Continue hydralazine and metoprolol  9.  BPH: Continue Flomax    Management plans discussed with the patient and he is in agreement.  CODE STATUS: Full  TOTAL TIME TAKING CARE OF THIS PATIENT: 30 minutes.     POSSIBLE D/C tomorrow, DEPENDING ON CLINICAL CONDITION.   Ronella Plunk M.D on 10/11/2018 at 9:55 AM  Between 7am to 6pm - Pager - [phone] After 6pm go to [email or web address] - password [email or web address] [facility]  Office  [phone]  CC: Primary care physician; [name], MD  Note: This dictation was prepared with Dragon dictation along with smaller phrase technology. Any transcriptional errors that result from this process are unintentional.

## 2018-10-12 ENCOUNTER — Observation Stay: Payer: Medicare Other

## 2018-10-12 LAB — RENAL FUNCTION PANEL
ANION GAP: 12 (ref 5–15)
Albumin: 2.8 g/dL — ABNORMAL LOW (ref 3.5–5.0)
BUN: 49 mg/dL — AB (ref 8–23)
CHLORIDE: 98 mmol/L (ref 98–111)
CO2: 26 mmol/L (ref 22–32)
Calcium: 8.4 mg/dL — ABNORMAL LOW (ref 8.9–10.3)
Creatinine, Ser: 7.53 mg/dL — ABNORMAL HIGH (ref 0.61–1.24)
GFR calc non Af Amer: 6 mL/min — ABNORMAL LOW (ref >60)
GFR, EST AFRICAN AMERICAN: 7 mL/min — AB (ref >60)
GLUCOSE: 126 mg/dL — AB (ref 70–99)
Phosphorus: 2.6 mg/dL (ref 2.5–4.6)
Potassium: 4.4 mmol/L (ref 3.5–5.1)
Sodium: 136 mmol/L (ref 135–145)

## 2018-10-12 LAB — CBC
HCT: 32.9 % — ABNORMAL LOW (ref 39.0–52.0)
HEMOGLOBIN: 10.5 g/dL — AB (ref 13.0–17.0)
MCH: 31.7 pg (ref 26.0–34.0)
MCHC: 31.9 g/dL (ref 30.0–36.0)
MCV: 99.4 fL (ref 80.0–100.0)
PLATELETS: 260 10*3/uL (ref 150–400)
RBC: 3.31 MIL/uL — AB (ref 4.22–5.81)
RDW: 13.3 % (ref 11.5–15.5)
WBC: 8.9 10*3/uL (ref 4.0–10.5)
nRBC: 0 % (ref 0.0–0.2)

## 2018-10-12 LAB — GLUCOSE, CAPILLARY
GLUCOSE-CAPILLARY: 100 mg/dL — AB (ref 70–99)
GLUCOSE-CAPILLARY: 107 mg/dL — AB (ref 70–99)
Glucose-Capillary: 123 mg/dL — ABNORMAL HIGH (ref 70–99)
Glucose-Capillary: 97 mg/dL (ref 70–99)

## 2018-10-12 LAB — PROCALCITONIN: Procalcitonin: 4.75 ng/mL

## 2018-10-12 NOTE — Progress Notes (Addendum)
[facility] at [place] NAME: Martin Gilbert    MR#:  [id number]  DATE OF BIRTH:  October 27, 1941  SUBJECTIVE:  States he has continued low back pain. Still having a hard time walking. Denies any additional vomiting.  REVIEW OF SYSTEMS:    Review of Systems  Constitutional: Negative for fever, chills weight loss HENT: Negative for ear pain, nosebleeds, congestion, facial swelling, rhinorrhea, neck pain, neck stiffness and ear discharge.   Respiratory: Negative for cough, shortness of breath, wheezing  Cardiovascular: Negative for chest pain, palpitations and leg swelling.  Gastrointestinal: Negative for heartburn, abdominal pain, vomiting, diarrhea or consitpation Genitourinary: Negative for dysuria, urgency, frequency, hematuria Musculoskeletal: + back pain and left leg weakness Neurological: Negative for dizziness, seizures, syncope, focal weakness,  numbness and headaches.  Hematological: Does not bruise/bleed easily.  Psychiatric/Behavioral: Negative for hallucinations, confusion, dysphoric mood  Tolerating Diet: yes  DRUG ALLERGIES:  No Known Allergies  VITALS:  Blood pressure (!) 131/50, pulse 60, temperature (!) 96.6 F (35.9 C), temperature source Axillary, resp. rate 16, height 5\' 11"  (1.803 m), weight 83.9 kg, SpO2 95 %.  PHYSICAL EXAMINATION:  Constitutional: Appears well-developed and well-nourished. No distress. HENT: Normocephalic, atraumatic. Oropharynx is clear and moist.  Eyes: Conjunctivae and EOM are normal. PERRLA, no scleral icterus.  Neck: Normal ROM. Neck supple. No JVD. No tracheal deviation. CVS: RRR, S1/S2 +, no murmurs, no gallops, no carotid bruit.  Pulmonary: Effort and breath sounds normal, no stridor, rhonchi, wheezes, rales.  Abdominal: Soft. BS +,  no distension, tenderness, rebound or guarding.  Musculoskeletal: +severe tenderness lower lumbar spine and sacral area, no edema and no tenderness.  Neuro:  Alert. CN 2-12 grossly intact. No focal deficits. Skin: Skin is warm and dry. No rash noted. Psychiatric: Normal mood and affect.   LABORATORY PANEL:   CBC Recent Labs  Lab 10/12/18 1109  WBC 8.9  HGB 10.5*  HCT 32.9*  PLT 260   ------------------------------------------------------------------------------------------------------------------  Chemistries  Recent Labs  Lab 10/10/18 1925  10/12/18 1109  NA 139   < > 136  K 3.9   < > 4.4  CL 102   < > 98  CO2 27   < > 26  GLUCOSE 118*   < > 126*  BUN 22   < > 49*  CREATININE 4.01*   < > 7.53*  CALCIUM 8.5*   < > 8.4*  AST 26  --   --   ALT 11  --   --   ALKPHOS 219*  --   --   BILITOT 1.1  --   --    < > = values in this interval not displayed.   ------------------------------------------------------------------------------------------------------------------  Cardiac Enzymes Recent Labs  Lab 10/10/18 1925 10/11/18 0434  TROPONINI 0.13* 0.14*   ------------------------------------------------------------------------------------------------------------------  RADIOLOGY:  Ct Abdomen Pelvis Wo Contrast  Result Date: 10/10/2018 CLINICAL DATA:  Nausea, vomiting and upper abdominal pain x5 days. EXAM: CT ABDOMEN AND PELVIS WITHOUT CONTRAST TECHNIQUE: Multidetector CT imaging of the abdomen and pelvis was performed following the standard protocol without IV contrast. COMPARISON:  09/29/2018 CT FINDINGS: Lower chest: Stable cardiomegaly without pericardial effusion or thickening. Coronary arteriosclerosis along the RCA. Dependent bibasilar atelectasis. Hepatobiliary: Mild surface nodularity of the liver compatible with morphologic changes of cirrhosis. Physiologically distended gallbladder without wall thickening or calculi. No biliary dilatation. Pancreas: Normal Spleen: Normal Adrenals/Urinary Tract: Normal bilateral adrenal glands. Cortical thinning of both kidneys with small cysts in the upper pole  the left kidney and  interpolar aspect, stable in appearance measuring approximately 8-9 mm each. No obstructive calculus nor hydroureteronephrosis. The urinary bladder is unremarkable for the degree of distention. Stomach/Bowel: Small hiatal hernia. The stomach is somewhat thickened in appearance but this is likely due to underdistention. The duodenal sweep and ligament of Treitz are normal. Slight diffuse fluid distention of small bowel, nonspecific but may be seen in enteritis. No mechanical bowel obstruction. The colon is unremarkable. The appendix is not well visualized but no pericecal inflammation is identified. Vascular/Lymphatic: Nonaneurysmal thoracic aorta with atherosclerosis. No enlarged abdominal or pelvic lymph nodes. Reproductive: Prostate is enlarged measuring 5.7 x 5 x 6.1 cm impressing upon the base of the bladder. Other: No free air nor free fluid. Musculoskeletal: No acute nor suspicious osseous lesions. Degenerative disc disease T8 through T10 and at T11-12. IMPRESSION: 1. Slight fluid distention of small bowel loops without mechanical bowel obstruction. Findings may represent a small bowel enteritis. 2. Stable cardiomegaly with coronary arteriosclerosis. 3. Morphologic changes of cirrhosis without space-occupying mass nor ascites. No splenomegaly. 4. Bilateral renal cortical atrophy with stable cyst the left upper pole measuring 9 mm and exophytic off the lateral aspect of the interpolar kidney measuring 9 mm. Electronically Signed   By: Ashley Royalty M.D.   On: 10/10/2018 21:33   Dg Lumbar Spine 2-3 Views  Result Date: 10/11/2018 CLINICAL DATA:  kyphoplasty 09-30-18. Patient complaining of back pain and unable to walk or bear weight on the left leg. He is status post kyphoplasty earlier this month with Dr. Rudene Christians. EXAM: LUMBAR SPINE - 2-3 VIEW COMPARISON:  CT, 10/10/2018. FINDINGS: Kyphoplasty cement lies in the iliac bones bilaterally adjacent to the SI joints, stable from the previous day's CT. No acute  fracture. All lumbar vertebra are normal in height. No spondylolisthesis. No bone lesion. Lumbar discs are well maintained in height. There are small endplate osteophytes throughout the lumbar spine and lower thoracic spine. Bones are demineralized. There is generalized increased bowel gas mild prominence of multiple loops of small bowel. This suggests a possible mild adynamic ileus. IMPRESSION: 1. Kyphoplasty cement within the posterior iliac bones adjacent to the SI joints, stable from the previous day's CT. 2. No acute fracture. Electronically Signed   By: Lajean Manes M.D.   On: 10/11/2018 11:04     ASSESSMENT AND PLAN:   77 year old male with end-stage renal disease on hemodialysis, recent closed fracture of the sacrum and kyphoplasty essential hypertension who presents to the emergency room due to generalized weakness with nausea and vomiting and unable to walk/bear weight on the left leg.  1.  Recent sacral fracture status post kyphoplasty and inability to ambulate: -Seen by PT today, who recommended SNF. -CSW consult  2.  Nausea/vomiting with acute viral gastroenteritis- improved, no additional vomiting. Tolerating renal diet. -Monitor  3. Low grade fever- had a temp to 100.25F. -Check PCT, UA, CXR  4.  Elevated troponin: Troponins are flat and this is due to poor renal clearance.  Patient has ruled out for ACS.  5.  End-stage renal disease on hemodialysis: Continue Tuesday, Thursday and Saturday dialysis -Plan for HD tomorrow.  6.  Diabetes: Continue current regimen with sliding scale/ADA diet  7.  Hyperlipidemia: Continue statin  8.  History of gout: Continue allopurinol  9.  Essential hypertension: Continue hydralazine and metoprolol  10.  BPH: Continue Flomax  Management plans discussed with the patient and he is in agreement.  CODE STATUS: Full  TOTAL TIME TAKING CARE  OF THIS PATIENT: 35 minutes.   POSSIBLE D/C tomorrow, DEPENDING ON DISPO PLANNING AND CLINICAL  CONDITION.   Berna Spare Mayo M.D on 10/12/2018 at 3:15 PM  Between 7am to 6pm - Pager - [phone] After 6pm go to [email or web address] - password [email or web address] [facility]  Office  [phone]  CC: Primary care physician; [name], MD  Note: This dictation was prepared with Dragon dictation along with smaller phrase technology. Any transcriptional errors that result from this process are unintentional.

## 2018-10-12 NOTE — Evaluation (Signed)
Physical Therapy Evaluation Patient Details Name: Martin Gilbert MRN: [id number] DOB: 06/25/1941 Today's Date: 10/12/2018   History of Present Illness  Pt presents with nausea and vomitting in addition to significant difficulty with ambulating ever since his sacroplasty on 09/30/18. Pt was d/c from [facility] on 10/01/18 but he reports only being able to ambulate to the bathroom since leaving, prior to d/c he was ambulating up to 200 feet with rest breaks. PMH significant for HTN, gerd, and DM.   Clinical Impression  Pt lying in bed upon arrival alert and oriented. Spanish interpreter Alean Rinne utilized during session for all communication between PT and patient. Pt in 10/10 pain in various places including B shoulders, B LE's, low back, and stomach. Pt limited in strength in BUE and BLE during testing with jerky movements noted in LLE hip flexion and knee extension. Pt requiring no physical assist for basic bed mobility and only min assist needed for sit to stand transfers, however very slow with significant forward lean. Pt ambulating slow with shortened steps, unsafe use of RW rolling it far out in front of him and occasionally getting outside of walker. No overt LOB however PT required to occasionally hold on to RW due to concerns for it rolling to far out in front of pt. Pt significantly fatigued after 40 feet needing to sit, HR 88. Pt is currently not at baseline and has regressed since d/c on 10/01/18 following sacroplasty and would benefit from STR due to significant functional limitations and decreased caregiver support at home.     Follow Up Recommendations SNF    Equipment Recommendations  None recommended by PT    Recommendations for Other Services       Precautions / Restrictions Precautions Precautions: Fall Restrictions Weight Bearing Restrictions: Yes RLE Weight Bearing: Weight bearing as tolerated LLE Weight Bearing: Weight bearing as tolerated      Mobility   Bed Mobility Overal bed mobility: Needs Assistance Bed Mobility: Supine to Sit     Supine to sit: Supervision     General bed mobility comments: Pt needing no physical assist for bed mobility. Safe once seated edge of bed with no assist needed for balance.   Transfers Overall transfer level: Needs assistance Equipment used: Rolling walker (2 wheeled) Transfers: Sit to/from Stand Sit to Stand: Min assist         General transfer comment: Pt slow to rise from sitting, min assist to fully get to standing, significant forward lean however independently able to correct once standing.   Ambulation/Gait Ambulation/Gait assistance: +2 safety/equipment;Min assist Gait Distance (Feet): 40 Feet Assistive device: Rolling walker (2 wheeled)       General Gait Details: Repeated VC to keep RW close, pt slow to ambulate, step to gait, occasionally stepping outside of RW, pt looking very unsafe occasional min assist to support balance  Stairs            Wheelchair Mobility    Modified Rankin (Stroke Patients Only)       Balance Overall balance assessment: Needs assistance Sitting-balance support: Feet supported Sitting balance-Leahy Scale: Good     Standing balance support: Bilateral upper extremity supported Standing balance-Leahy Scale: Fair                               Pertinent Vitals/Pain Pain Assessment: 0-10 Pain Score: 10-Worst pain ever Pain Location: Bilateral shoulders, stomach, BLE, left side low back Pain Intervention(s): Monitored during  session;RN gave pain meds during session    [place] expects to be discharged to:: Private residence Living Arrangements: Spouse/significant other Available Help at Discharge: Family Type of Home: Mobile home Home Access: [place]: One Sophia: Environmental consultant - 2 wheels;Kasandra Knudsen - single point      Prior Function Level of Independence: Independent with  assistive device(s)         Comments: No falls since leaving hospital on 10/01/18. Pt has been using RW at home since d/c. Minimal ambulation due to pain and weakness.      Hand Dominance        Extremity/Trunk Assessment   Upper Extremity Assessment Upper Extremity Assessment: Generalized weakness(only able to achieve 90deg of shoulder flex B, grossly >3+/5 for elbow flex/ext and composite grip)    Lower Extremity Assessment Lower Extremity Assessment: Generalized weakness(pain limited, grossly >3/5 B, jerky movements during L hip flexion and L knee extension)       Communication   Communication: Interpreter utilized  Cognition Arousal/Alertness: Awake/alert Behavior During Therapy: WFL for tasks assessed/performed Overall Cognitive Status: Within Functional Limits for tasks assessed                                        General Comments General comments (skin integrity, edema, etc.): HR in 70's prior to exertion, reaching high 80's with ambulation    Exercises     Assessment/Plan    PT Assessment Patient needs continued PT services  PT Problem List Decreased strength;Decreased knowledge of use of DME;Decreased activity tolerance;Decreased safety awareness;Decreased balance;Decreased mobility;Pain       PT Treatment Interventions Gait training;Therapeutic exercise;Balance training;DME instruction;Functional mobility training;Therapeutic activities    PT Goals (Current goals can be found in the Care Plan section)  Acute Rehab PT Goals Patient Stated Goal: have less pain and walk PT Goal Formulation: With patient Time For Goal Achievement: 10/26/18 Potential to Achieve Goals: Good    Frequency 7X/week   Barriers to discharge Decreased caregiver support      Co-evaluation               AM-PAC PT "6 Clicks" Daily Activity  Outcome Measure Difficulty turning over in bed (including adjusting bedclothes, sheets and blankets)?: A  Lot Difficulty moving from lying on back to sitting on the side of the bed? : A Lot Difficulty sitting down on and standing up from a chair with arms (e.g., wheelchair, bedside commode, etc,.)?: Unable Help needed moving to and from a bed to chair (including a wheelchair)?: A Lot Help needed walking in hospital room?: A Lot Help needed climbing 3-5 steps with a railing? : Total 6 Click Score: 10    End of Session Equipment Utilized During Treatment: Gait belt Activity Tolerance: Patient tolerated treatment well;Patient limited by fatigue;Patient limited by pain Patient left: in chair;with call bell/phone within reach;with chair alarm set(with OT) Nurse Communication: Mobility status PT Visit Diagnosis: Muscle weakness (generalized) (M62.81);Difficulty in walking, not elsewhere classified (R26.2);Unsteadiness on feet (R26.81);Pain Pain - Right/Left: Left Pain - part of body: Hip    Time: 1779-3903 PT Time Calculation (min) (ACUTE ONLY): 36 min   Charges:              Ernie Avena, SPT 10/12/2018, 11:21 AM

## 2018-10-12 NOTE — Evaluation (Signed)
Occupational Therapy Treatment Patient Details Name: Martin Gilbert MRN: [id number] DOB: 1941-05-21 Today's Date: 10/12/2018    History of present illness Pt presents with nausea and vomitting in addition to significant difficulty with ambulating ever since his sacroplasty on 09/30/18. Pt was d/c from [facility] on 10/01/18 but he reports only being able to ambulate to the bathroom since leaving, prior to d/c he was ambulating up to 200 feet with rest breaks. PMH significant for HTN, gerd, and DM.    OT comments  Pt seen for OT evaluation this date. Pt is a 77 y/o male who presented with nausea and vomiting in addition to difficulty with ambulating ever since his sacroplasty on 09/30/2018. Pt alert and ready for OT session. Spanish interpreter Alean Rinne utilized during session for all communication between PT and patient/family. Pt lives with spouse in a mobile home with 3 steps to enter. Pt's spouse is blind and pt is the primary caregiver assisting with most ADL tasks. This is a barrier to his discharge.  Currently, pt demonstrates impairments in pain, ROM, strength, and safety awareness requiring Min/Mod A  for LB ADL tasks when standing 2/2 balance and pain (10/10) impairments. Pt grossly 4/5 strength for both BUE and BUE. Pt/spouse educated in pursed lip breathing techniques as a coping mechanism for his pain mgt.. Pt will benefit from skilled OT services to address noted impairments and functional deficits in order to maximize safety and independence and minimize falls risk and caregiver burden. OT recommends SNF upon discharge.   Follow Up Recommendations  SNF    Equipment Recommendations  None recommended by OT    Recommendations for Other Services      Precautions / Restrictions Precautions Precautions: Fall Restrictions Weight Bearing Restrictions: Yes RLE Weight Bearing: Weight bearing as tolerated LLE Weight Bearing: Weight bearing as tolerated       Mobility Bed  Mobility Overal bed mobility: (deferred; pt in recliner after PT session)          Transfers Overall transfer level: (deferred; pt in recliner after PT session)             Balance                                  ADL either performed or assessed with clinical judgement   ADL Overall ADL's : Needs assistance/impaired Eating/Feeding: Independent;Sitting   Grooming: Modified independent;Sitting   Upper Body Bathing: Sitting;Supervision/ safety   Lower Body Bathing: Sitting/lateral leans;Supervison/ safety;Minimal assistance;Moderate assistance;Sit to/from stand Lower Body Bathing Details (indicate cue type and reason): Pt requires extra time/effort if sitting for task. If standing, pt requires Min/Mod A.  Upper Body Dressing : Sitting;Modified independent Upper Body Dressing Details (indicate cue type and reason): Pt requires extra time/effort if sitting for task.  Lower Body Dressing: Minimal assistance;Moderate assistance;Sitting/lateral leans;Supervision/safety Lower Body Dressing Details (indicate cue type and reason): Pt requires extra time/effort if sitting for task. If standing, pt requires Min/Mod A.  Toilet Transfer: Minimal assistance;Moderate assistance;RW           Functional mobility during ADLs: Minimal assistance;+2 for safety/equipment;Rolling walker;Cueing for safety       Vision Baseline Vision/History: No visual deficits     Perception     Praxis      Cognition Arousal/Alertness: Awake/alert Behavior During Therapy: WFL for tasks assessed/performed Overall Cognitive Status: Within Functional Limits for tasks assessed  Exercises Other Exercises Other Exercises: Pt educated in pursed lip breathing as coping mechanism for pain.    Shoulder Instructions       General Comments     Pertinent Vitals/ Pain       Pain Assessment: 0-10 Pain Score: 10-Worst pain  ever Pain Location: in hips and low back Pain Intervention(s): Limited activity within patient's tolerance;Monitored during session;Premedicated before session  Home Living Family/patient expects to be discharged to:: Private residence Living Arrangements: Spouse/significant other Available Help at Discharge: Family;Available PRN/intermittently Type of Home: Mobile home Home Access: Ramped entrance     Home Layout: One level     Bathroom Shower/Tub: Teacher, early years/pre: Standard     Home Equipment: Cane - single point;Walker - 2 wheels   Additional Comments: Information gathered from using interpreter       Prior Functioning/Environment Level of Independence: Independent with assistive device(s)        Comments: Pt reports no falls in past 12 months.  Pt has been using RW at home and in community since d/c. Minimal ambulation due to pain and weakness.    Frequency  Min 1X/week        Progress Toward Goals  OT Goals(current goals can now be found in the care plan section)     Acute Rehab OT Goals Patient Stated Goal: have less pain and walk OT Goal Formulation: With patient Time For Goal Achievement: 10/26/18 Potential to Achieve Goals: Good ADL Goals Pt Will Perform Lower Body Dressing: with supervision;sit to/from stand;with adaptive equipment Additional ADL Goal #1: Pt will utilize pursed lip breathing as a coping strategy for his pain mgt.  Plan      Co-evaluation                 AM-PAC PT "6 Clicks" Daily Activity     Outcome Measure   Help from another person eating meals?: None Help from another person taking care of personal grooming?: None Help from another person toileting, which includes using toliet, bedpan, or urinal?: A Little Help from another person bathing (including washing, rinsing, drying)?: A Little Help from another person to put on and taking off regular upper body clothing?: None Help from another person to  put on and taking off regular lower body clothing?: A Little 6 Click Score: 21    End of Session    OT Visit Diagnosis: Other abnormalities of gait and mobility (R26.89);Pain Pain - Right/Left: Right Pain - part of body: (back)   Activity Tolerance Patient tolerated treatment well   Patient Left in chair;with family/visitor present;with chair alarm set;with call bell/phone within reach   Nurse Communication          Time: 9528-4132 OT Time Calculation (min): 15 min  Charges:    Jadene Pierini OTS   10/12/2018, 12:18 PM

## 2018-10-12 NOTE — Progress Notes (Signed)
[facility]  ROUNDING NOTE   Subjective:   History taken with assistance of Spanish Interpreter.   Patient working with PT/OT  Patient states he feels week.   Objective:  Vital signs in last 24 hours:  Temp:  [96.6 F (35.9 C)-100.2 F (37.9 C)] 96.6 F (35.9 C) (10/28 1426) Pulse Rate:  [60-76] 60 (10/28 1426) Resp:  [16-20] 16 (10/28 1426) BP: (131-147)/(50-56) 131/50 (10/28 1426) SpO2:  [92 %-95 %] 95 % (10/28 1426) Weight:  [83.9 kg] 83.9 kg (10/28 0451)  Weight change: 11.3 kg Filed Weights   10/10/18 2254 10/11/18 0554 10/12/18 0451  Weight: 79 kg 80.8 kg 83.9 kg    Intake/Output: I/O last 3 completed shifts: In: 240 [P.O.:240] Out: -    Intake/Output this shift:  Total I/O In: -  Out: 500 [Emesis/NG output:500]  Physical Exam: General: No acute distress  Head: Normocephalic, atraumatic. Moist oral mucosal membranes  Eyes: Anicteric  Neck: Supple, trachea midline  Lungs:  Clear to auscultation, normal effort  Heart: S1S2 no rubs  Abdomen:  Soft, nontender, bowel sounds present  Extremities: No peripheral edema.  Neurologic: Awake, alert, following commands  Skin: No lesions  Access: L AVF    Basic Metabolic Panel: Recent Labs  Lab 10/10/18 1925 10/11/18 0434 10/12/18 1109  NA 139 141 136  K 3.9 4.1 4.4  CL 102 103 98  CO2 27 30 26   GLUCOSE 118* 91 126*  BUN 22 28* 49*  CREATININE 4.01* 4.56* 7.53*  CALCIUM 8.5* 8.3* 8.4*  PHOS  --   --  2.6    Liver Function Tests: Recent Labs  Lab 10/10/18 1925 10/12/18 1109  AST 26  --   ALT 11  --   ALKPHOS 219*  --   BILITOT 1.1  --   PROT 6.7  --   ALBUMIN 3.0* 2.8*   Recent Labs  Lab 10/10/18 1925  LIPASE 36   No results for input(s): AMMONIA in the last 168 hours.  CBC: Recent Labs  Lab 10/10/18 1925 10/11/18 0434 10/12/18 1109  WBC 10.0 8.3 8.9  HGB 10.6* 10.1* 10.5*  HCT 32.8* 31.1* 32.9*  MCV 97.6 99.4 99.4  PLT 241 219 260    Cardiac Enzymes: Recent  Labs  Lab 10/10/18 1925 10/11/18 0434  TROPONINI 0.13* 0.14*    BNP: Invalid input(s): POCBNP  CBG: Recent Labs  Lab 10/11/18 1152 10/11/18 1652 10/11/18 2157 10/12/18 0752 10/12/18 1206  GLUCAP 84 107* 124* 100* 34*    Microbiology: Results for orders placed or performed during the hospital encounter of 09/29/18  MRSA PCR Screening     Status: None   Collection Time: 09/29/18  9:54 PM  Result Value Ref Range Status   MRSA by PCR NEGATIVE NEGATIVE Final    Comment:        The GeneXpert MRSA Assay (FDA approved for NASAL specimens only), is one component of a comprehensive MRSA colonization surveillance program. It is not intended to diagnose MRSA infection nor to guide or monitor treatment for MRSA infections. Performed at [facility], [address] [place], [place] [address]     Coagulation Studies: No results for input(s): LABPROT, INR in the last 72 hours.  Urinalysis: Recent Labs    10/10/18 1925  COLORURINE AMBER*  LABSPEC 1.016  PHURINE 8.0  GLUCOSEU NEGATIVE  HGBUR NEGATIVE  BILIRUBINUR NEGATIVE  KETONESUR NEGATIVE  PROTEINUR >=300*  NITRITE NEGATIVE  LEUKOCYTESUR SMALL*      Imaging: Ct Abdomen  Pelvis Wo Contrast  Result Date: 10/10/2018 CLINICAL DATA:  Nausea, vomiting and upper abdominal pain x5 days. EXAM: CT ABDOMEN AND PELVIS WITHOUT CONTRAST TECHNIQUE: Multidetector CT imaging of the abdomen and pelvis was performed following the standard protocol without IV contrast. COMPARISON:  09/29/2018 CT FINDINGS: Lower chest: Stable cardiomegaly without pericardial effusion or thickening. Coronary arteriosclerosis along the RCA. Dependent bibasilar atelectasis. Hepatobiliary: Mild surface nodularity of the liver compatible with morphologic changes of cirrhosis. Physiologically distended gallbladder without wall thickening or calculi. No biliary dilatation. Pancreas: Normal Spleen: Normal Adrenals/Urinary Tract: Normal bilateral  adrenal glands. Cortical thinning of both kidneys with small cysts in the upper pole the left kidney and interpolar aspect, stable in appearance measuring approximately 8-9 mm each. No obstructive calculus nor hydroureteronephrosis. The urinary bladder is unremarkable for the degree of distention. Stomach/Bowel: Small hiatal hernia. The stomach is somewhat thickened in appearance but this is likely due to underdistention. The duodenal sweep and ligament of Treitz are normal. Slight diffuse fluid distention of small bowel, nonspecific but may be seen in enteritis. No mechanical bowel obstruction. The colon is unremarkable. The appendix is not well visualized but no pericecal inflammation is identified. Vascular/Lymphatic: Nonaneurysmal thoracic aorta with atherosclerosis. No enlarged abdominal or pelvic lymph nodes. Reproductive: Prostate is enlarged measuring 5.7 x 5 x 6.1 cm impressing upon the base of the bladder. Other: No free air nor free fluid. Musculoskeletal: No acute nor suspicious osseous lesions. Degenerative disc disease T8 through T10 and at T11-12. IMPRESSION: 1. Slight fluid distention of small bowel loops without mechanical bowel obstruction. Findings may represent a small bowel enteritis. 2. Stable cardiomegaly with coronary arteriosclerosis. 3. Morphologic changes of cirrhosis without space-occupying mass nor ascites. No splenomegaly. 4. Bilateral renal cortical atrophy with stable cyst the left upper pole measuring 9 mm and exophytic off the lateral aspect of the interpolar kidney measuring 9 mm. Electronically Signed   By: Ashley Royalty M.D.   On: 10/10/2018 21:33   Dg Chest 1 View  Result Date: 10/12/2018 CLINICAL DATA:  Nausea, vomiting and upper abdominal pain for 5 days. Patient underwent sacroplasty 12 days ago. History of anemia, chronic kidney disease on hemodialysis, diabetes and colitis. EXAM: CHEST  1 VIEW COMPARISON:  11/25/2017 portable chest. FINDINGS: 1551 hours. The hemodialysis  catheter has been removed. There is stable atelectasis or scarring at the left lung base. The right lung remains clear. There is no pleural effusion or pneumothorax. The heart size and mediastinal contours are stable. IMPRESSION: No acute cardiopulmonary process. Stable left basilar scarring or atelectasis. Electronically Signed   By: Richardean Sale M.D.   On: 10/12/2018 16:01   Dg Lumbar Spine 2-3 Views  Result Date: 10/11/2018 CLINICAL DATA:  kyphoplasty 09-30-18. Patient complaining of back pain and unable to walk or bear weight on the left leg. He is status post kyphoplasty earlier this month with Dr. Rudene Christians. EXAM: LUMBAR SPINE - 2-3 VIEW COMPARISON:  CT, 10/10/2018. FINDINGS: Kyphoplasty cement lies in the iliac bones bilaterally adjacent to the SI joints, stable from the previous day's CT. No acute fracture. All lumbar vertebra are normal in height. No spondylolisthesis. No bone lesion. Lumbar discs are well maintained in height. There are small endplate osteophytes throughout the lumbar spine and lower thoracic spine. Bones are demineralized. There is generalized increased bowel gas mild prominence of multiple loops of small bowel. This suggests a possible mild adynamic ileus. IMPRESSION: 1. Kyphoplasty cement within the posterior iliac bones adjacent to the SI joints, stable from the  previous day's CT. 2. No acute fracture. Electronically Signed   By: Lajean Manes M.D.   On: 10/11/2018 11:04     Medications:    . allopurinol  100 mg Oral Daily  . aspirin EC  81 mg Oral Daily  . atorvastatin  20 mg Oral Daily  . cholecalciferol  1,000 Units Oral Daily  . docusate sodium  100 mg Oral BID  . ferrous sulfate  325 mg Oral BID WC  . heparin  5,000 Units Subcutaneous Q8H  . hydrALAZINE  50 mg Oral TID  . insulin aspart  0-5 Units Subcutaneous QHS  . insulin aspart  0-9 Units Subcutaneous TID WC  . Melatonin  5 mg Oral QHS  . metoprolol succinate  25 mg Oral Daily  . multivitamin  1 tablet  Oral QHS  . pantoprazole  40 mg Oral Daily  . sodium bicarbonate  650 mg Oral BID  . tamsulosin  0.4 mg Oral QPC supper   acetaminophen **OR** acetaminophen, bisacodyl, HYDROcodone-acetaminophen, ondansetron **OR** ondansetron (ZOFRAN) IV, traZODone  Assessment/ Plan:  77 y.o. male with end-stage renal disease, diabetes, hypertension, BPH, anemia of CKD, hx of kyphoplasty, comes in with recurrent back pain.   TTS [facility] left AVF 81kg   1. End Stage Renal Disease: TTS schedule. Dialysis for tomorrow.  Seated in chair - continue sodium bicarbonate for metabolic acidosis  2. Hypertension: blood pressure at goal.  - metoprolol, tamsulosin and hydralazine.   3. Anemia of chronic kidney disease: hemoglobin of 10.5 - EPO with HD treatment  4. Secondary Hyperparathyroidism: outpatient PTH, calcium and phosphorus at goal - not currently on binders.    LOS: 1 Cerise Lieber 10/28/20195:02 PM

## 2018-10-13 DIAGNOSIS — K219 Gastro-esophageal reflux disease without esophagitis: Secondary | ICD-10-CM | POA: Diagnosis present

## 2018-10-13 DIAGNOSIS — R109 Unspecified abdominal pain: Secondary | ICD-10-CM | POA: Diagnosis present

## 2018-10-13 DIAGNOSIS — N400 Enlarged prostate without lower urinary tract symptoms: Secondary | ICD-10-CM | POA: Diagnosis present

## 2018-10-13 DIAGNOSIS — E785 Hyperlipidemia, unspecified: Secondary | ICD-10-CM | POA: Diagnosis present

## 2018-10-13 DIAGNOSIS — Z8249 Family history of ischemic heart disease and other diseases of the circulatory system: Secondary | ICD-10-CM | POA: Diagnosis not present

## 2018-10-13 DIAGNOSIS — N2581 Secondary hyperparathyroidism of renal origin: Secondary | ICD-10-CM | POA: Diagnosis present

## 2018-10-13 DIAGNOSIS — M25551 Pain in right hip: Secondary | ICD-10-CM | POA: Diagnosis present

## 2018-10-13 DIAGNOSIS — M25552 Pain in left hip: Secondary | ICD-10-CM | POA: Diagnosis present

## 2018-10-13 DIAGNOSIS — I517 Cardiomegaly: Secondary | ICD-10-CM | POA: Diagnosis present

## 2018-10-13 DIAGNOSIS — Z79899 Other long term (current) drug therapy: Secondary | ICD-10-CM | POA: Diagnosis not present

## 2018-10-13 DIAGNOSIS — E1122 Type 2 diabetes mellitus with diabetic chronic kidney disease: Secondary | ICD-10-CM | POA: Diagnosis present

## 2018-10-13 DIAGNOSIS — E860 Dehydration: Secondary | ICD-10-CM | POA: Diagnosis present

## 2018-10-13 DIAGNOSIS — Z7982 Long term (current) use of aspirin: Secondary | ICD-10-CM | POA: Diagnosis not present

## 2018-10-13 DIAGNOSIS — N186 End stage renal disease: Secondary | ICD-10-CM | POA: Diagnosis present

## 2018-10-13 DIAGNOSIS — I2510 Atherosclerotic heart disease of native coronary artery without angina pectoris: Secondary | ICD-10-CM | POA: Diagnosis present

## 2018-10-13 DIAGNOSIS — I120 Hypertensive chronic kidney disease with stage 5 chronic kidney disease or end stage renal disease: Secondary | ICD-10-CM | POA: Diagnosis present

## 2018-10-13 DIAGNOSIS — K2970 Gastritis, unspecified, without bleeding: Secondary | ICD-10-CM | POA: Diagnosis present

## 2018-10-13 DIAGNOSIS — A084 Viral intestinal infection, unspecified: Secondary | ICD-10-CM | POA: Diagnosis present

## 2018-10-13 DIAGNOSIS — D631 Anemia in chronic kidney disease: Secondary | ICD-10-CM | POA: Diagnosis present

## 2018-10-13 DIAGNOSIS — Z992 Dependence on renal dialysis: Secondary | ICD-10-CM | POA: Diagnosis not present

## 2018-10-13 DIAGNOSIS — K7460 Unspecified cirrhosis of liver: Secondary | ICD-10-CM | POA: Diagnosis present

## 2018-10-13 DIAGNOSIS — M109 Gout, unspecified: Secondary | ICD-10-CM | POA: Diagnosis present

## 2018-10-13 DIAGNOSIS — E872 Acidosis: Secondary | ICD-10-CM | POA: Diagnosis not present

## 2018-10-13 LAB — RENAL FUNCTION PANEL
ANION GAP: 10 (ref 5–15)
Albumin: 2.6 g/dL — ABNORMAL LOW (ref 3.5–5.0)
BUN: 61 mg/dL — ABNORMAL HIGH (ref 8–23)
CHLORIDE: 99 mmol/L (ref 98–111)
CO2: 27 mmol/L (ref 22–32)
Calcium: 8.3 mg/dL — ABNORMAL LOW (ref 8.9–10.3)
Creatinine, Ser: 8.3 mg/dL — ABNORMAL HIGH (ref 0.61–1.24)
GFR calc Af Amer: 6 mL/min — ABNORMAL LOW (ref >60)
GFR, EST NON AFRICAN AMERICAN: 5 mL/min — AB (ref >60)
Glucose, Bld: 80 mg/dL (ref 70–99)
POTASSIUM: 4.8 mmol/L (ref 3.5–5.1)
Phosphorus: 3.5 mg/dL (ref 2.5–4.6)
Sodium: 136 mmol/L (ref 135–145)

## 2018-10-13 LAB — CBC
HEMATOCRIT: 29.9 % — AB (ref 39.0–52.0)
HEMOGLOBIN: 9.7 g/dL — AB (ref 13.0–17.0)
MCH: 31.7 pg (ref 26.0–34.0)
MCHC: 32.4 g/dL (ref 30.0–36.0)
MCV: 97.7 fL (ref 80.0–100.0)
NRBC: 0 % (ref 0.0–0.2)
Platelets: 245 10*3/uL (ref 150–400)
RBC: 3.06 MIL/uL — ABNORMAL LOW (ref 4.22–5.81)
RDW: 13.2 % (ref 11.5–15.5)
WBC: 8.7 10*3/uL (ref 4.0–10.5)

## 2018-10-13 LAB — GLUCOSE, CAPILLARY
GLUCOSE-CAPILLARY: 68 mg/dL — AB (ref 70–99)
GLUCOSE-CAPILLARY: 151 mg/dL — AB (ref 70–99)
GLUCOSE-CAPILLARY: 127 mg/dL — AB (ref 70–99)

## 2018-10-13 LAB — PROCALCITONIN: PROCALCITONIN: 3.95 ng/mL

## 2018-10-13 MED ORDER — EPOETIN ALFA 10000 UNIT/ML IJ SOLN
4000.0000 [IU] | INTRAMUSCULAR | Status: DC
  Administered 2018-10-13: 4000 [IU] via INTRAVENOUS

## 2018-10-13 NOTE — Progress Notes (Signed)
HD initiated via L AVF using 15g needles x2 without issue. No current complaints. Denies pain. UF 2L as ordered.

## 2018-10-13 NOTE — Progress Notes (Signed)
PT Cancellation Note  Patient Details Name: Martin Gilbert MRN: [id number] DOB: 1941-06-26   Cancelled Treatment:    Reason Eval/Treat Not Completed: Patient at procedure or test/unavailable Pt out of room this afternoon, will try back later as appropriate and as time allows.   Kreg Shropshire, DPT  10/13/2018, 3:47 PM

## 2018-10-13 NOTE — Progress Notes (Signed)
[facility]  ROUNDING NOTE   Subjective:   History taken with assistance of Spanish Interpreter.   Seen and examined on hemodialysis. Tolerating treatment well. Seated in a chair. UF goal of 2 liters.   Patient states he is still having a lot of bilateral hip pain.   Objective:  Vital signs in last 24 hours:  Temp:  [96.6 F (35.9 C)-98.1 F (36.7 C)] 97 F (36.1 C) (10/29 1111) Pulse Rate:  [60-75] 68 (10/29 1345) Resp:  [11-21] 14 (10/29 1345) BP: (104-137)/(42-60) 104/42 (10/29 1345) SpO2:  [95 %-96 %] 95 % (10/29 1111) Weight:  [78.7 kg] 78.7 kg (10/29 1111)  Weight change: -5.2 kg Filed Weights   10/12/18 0451 10/13/18 0500 10/13/18 1111  Weight: 83.9 kg 78.7 kg 78.7 kg    Intake/Output: I/O last 3 completed shifts: In: 120 [P.O.:120] Out: 900 [Emesis/NG output:900]   Intake/Output this shift:  No intake/output data recorded.  Physical Exam: General: No acute distress  Head: Normocephalic, atraumatic. Moist oral mucosal membranes  Eyes: Anicteric  Neck: Supple, trachea midline  Lungs:  Clear to auscultation, normal effort  Heart: S1S2 no rubs  Abdomen:  Soft, nontender, bowel sounds present  Extremities: No peripheral edema.  Neurologic: Awake, alert, following commands  Skin: No lesions  Access: L AVF    Basic Metabolic Panel: Recent Labs  Lab 10/10/18 1925 10/11/18 0434 10/12/18 1109 10/13/18 0418  NA 139 141 136 136  K 3.9 4.1 4.4 4.8  CL 102 103 98 99  CO2 27 30 26 27   GLUCOSE 118* 91 126* 80  BUN 22 28* 49* 61*  CREATININE 4.01* 4.56* 7.53* 8.30*  CALCIUM 8.5* 8.3* 8.4* 8.3*  PHOS  --   --  2.6 3.5    Liver Function Tests: Recent Labs  Lab 10/10/18 1925 10/12/18 1109 10/13/18 0418  AST 26  --   --   ALT 11  --   --   ALKPHOS 219*  --   --   BILITOT 1.1  --   --   PROT 6.7  --   --   ALBUMIN 3.0* 2.8* 2.6*   Recent Labs  Lab 10/10/18 1925  LIPASE 36   No results for input(s): AMMONIA in the last 168  hours.  CBC: Recent Labs  Lab 10/10/18 1925 10/11/18 0434 10/12/18 1109 10/13/18 0418  WBC 10.0 8.3 8.9 8.7  HGB 10.6* 10.1* 10.5* 9.7*  HCT 32.8* 31.1* 32.9* 29.9*  MCV 97.6 99.4 99.4 97.7  PLT 241 219 260 245    Cardiac Enzymes: Recent Labs  Lab 10/10/18 1925 10/11/18 0434  TROPONINI 0.13* 0.14*    BNP: Invalid input(s): POCBNP  CBG: Recent Labs  Lab 10/12/18 0752 10/12/18 1206 10/12/18 1705 10/12/18 2115 10/13/18 0736  GLUCAP 100* 123* 107* 97 50*    Microbiology: Results for orders placed or performed during the hospital encounter of 09/29/18  MRSA PCR Screening     Status: None   Collection Time: 09/29/18  9:54 PM  Result Value Ref Range Status   MRSA by PCR NEGATIVE NEGATIVE Final    Comment:        The GeneXpert MRSA Assay (FDA approved for NASAL specimens only), is one component of a comprehensive MRSA colonization surveillance program. It is not intended to diagnose MRSA infection nor to guide or monitor treatment for MRSA infections. Performed at [facility], [address] [place], [place] [address]     Coagulation Studies: No results for input(s): LABPROT,  INR in the last 72 hours.  Urinalysis: Recent Labs    10/10/18 1925  COLORURINE AMBER*  LABSPEC 1.016  PHURINE 8.0  GLUCOSEU NEGATIVE  HGBUR NEGATIVE  BILIRUBINUR NEGATIVE  KETONESUR NEGATIVE  PROTEINUR >=300*  NITRITE NEGATIVE  LEUKOCYTESUR SMALL*      Imaging: Dg Chest 1 View  Result Date: 10/12/2018 CLINICAL DATA:  Nausea, vomiting and upper abdominal pain for 5 days. Patient underwent sacroplasty 12 days ago. History of anemia, chronic kidney disease on hemodialysis, diabetes and colitis. EXAM: CHEST  1 VIEW COMPARISON:  11/25/2017 portable chest. FINDINGS: 1551 hours. The hemodialysis catheter has been removed. There is stable atelectasis or scarring at the left lung base. The right lung remains clear. There is no pleural effusion or pneumothorax. The  heart size and mediastinal contours are stable. IMPRESSION: No acute cardiopulmonary process. Stable left basilar scarring or atelectasis. Electronically Signed   By: Richardean Sale M.D.   On: 10/12/2018 16:01     Medications:    . allopurinol  100 mg Oral Daily  . aspirin EC  81 mg Oral Daily  . atorvastatin  20 mg Oral Daily  . cholecalciferol  1,000 Units Oral Daily  . docusate sodium  100 mg Oral BID  . ferrous sulfate  325 mg Oral BID WC  . heparin  5,000 Units Subcutaneous Q8H  . hydrALAZINE  50 mg Oral TID  . insulin aspart  0-5 Units Subcutaneous QHS  . insulin aspart  0-9 Units Subcutaneous TID WC  . Melatonin  5 mg Oral QHS  . metoprolol succinate  25 mg Oral Daily  . multivitamin  1 tablet Oral QHS  . pantoprazole  40 mg Oral Daily  . sodium bicarbonate  650 mg Oral BID  . tamsulosin  0.4 mg Oral QPC supper   acetaminophen **OR** acetaminophen, bisacodyl, HYDROcodone-acetaminophen, ondansetron **OR** ondansetron (ZOFRAN) IV, traZODone  Assessment/ Plan:  77 y.o. male with end-stage renal disease, diabetes, hypertension, BPH, anemia of CKD, hx of kyphoplasty, comes in with recurrent back pain.   TTS [facility] left AVF 81kg   1. End Stage Renal Disease: TTS schedule. Seen and examined on hemodialysis. Tolerating treatment well.  Seated in chair - continue sodium bicarbonate for metabolic acidosis  2. Hypertension: blood pressure at goal.  - metoprolol, tamsulosin and hydralazine.   3. Anemia of chronic kidney disease:  - EPO with HD treatment  4. Secondary Hyperparathyroidism: outpatient PTH, calcium and phosphorus at goal - not currently on binders.    LOS: 1 Martin Gilbert 10/29/20192:00 PM

## 2018-10-13 NOTE — Progress Notes (Signed)
Tolerated dialysis well. UF 2L as ordered. No current complaints. Report called to primary RN.

## 2018-10-13 NOTE — Progress Notes (Signed)
Pre hd 

## 2018-10-13 NOTE — Care Management (Signed)
Patient admitted with Recent sacral fracture status post kyphoplasty and inability to ambulate.  Patient lives at home with wife.  Daughters live locally for support.  Patient chronic HD patient.  [name] dialysis liaison notified of admission.  PCP Rutherford Guys at [facility]. Patient utlizes [facility] for transportation.  Patient open with [facility].  Malachy Mood with [facility] notified of admission.  PT has seen patient and recommending SNF.  At this time patient does not have qualifying 3 night inpatient stay for Medicare and would have to return home with home health services. Daughter Angie called and updated, she is in agreement to plan

## 2018-10-13 NOTE — Progress Notes (Signed)
Occupational Therapy Treatment Patient Details Name: Martin Gilbert MRN: [id number] DOB: Oct 02, 1941 Today's Date: 10/13/2018    History of present illness Pt presents with nausea and vomitting in addition to significant difficulty with ambulating ever since his sacroplasty on 09/30/18. Pt was d/c from [facility] on 10/01/18 but he reports only being able to ambulate to the bathroom since leaving, prior to d/c he was ambulating up to 200 feet with rest breaks. PMH significant for HTN, gerd, and DM.    OT comments  Pt seen for ADL retraining and discussed use of AD including reacher and sock aid.  Catalog left and info reviewed with patient and his wife with Darrick Meigs from the [place] on a Chief Strategy Officer. HIs wife is blind and cannot help him with anything so he has to do everything.  Discussed rec to use reacher and sock aid and demonstration provided but pt did not want to try since he felt like he was going to vomit since he just ate breakfast.  He stated that he prefers to focus on increasing UB strength with theraband like he was doing at home.  He is not able to read in Spanish and needs exercises with pictures only. He stated that home therapists do not speak Spanish so it is hard to communicate with them.  Will try to see patient on Mon, Wed or Friday when he does not have dialysis.     Follow Up Recommendations  SNF    Equipment Recommendations  None recommended by OT    Recommendations for Other Services      Precautions / Restrictions Precautions Precautions: Fall Restrictions Weight Bearing Restrictions: No RLE Weight Bearing: Weight bearing as tolerated LLE Weight Bearing: Weight bearing as tolerated       Mobility Bed Mobility                  Transfers                      Balance                                           ADL either performed or assessed with clinical judgement   ADL                                          General ADL Comments: Discussed use of AD for LB dressing for when his pain in back is bad.  HIs wife is blind and cannot help him with anything so he has to do everything.  Discussed rec to use reacher and sock aid and demonstration provided but pt did not want to try since he felt like he was going to vomit since he just ate breakfast.  He stated that he prefers to focus on increasing UB strength with theraband like he was doing at home.  He is not able to read in Spanish and needs exercises with pictures only. He stated that home therapists do not speak Spanish so it is hard to communicate with them.       Vision Baseline Vision/History: No visual deficits     Perception     Praxis      Cognition Arousal/Alertness: Awake/alert Behavior During Therapy: WFL for tasks assessed/performed Overall Cognitive Status: Within  Functional Limits for tasks assessed                                          Exercises     Shoulder Instructions       General Comments      Pertinent Vitals/ Pain       Pain Assessment: Faces Pain Score: 6  Pain Location: in hips and low back Pain Descriptors / Indicators: Aching;Constant Pain Intervention(s): Limited activity within patient's tolerance;Monitored during session;Premedicated before session;Repositioned  Home Living                                          Prior Functioning/Environment              Frequency  Min 1X/week        Progress Toward Goals  OT Goals(current goals can now be found in the care plan section)  Progress towards OT goals: Progressing toward goals  Acute Rehab OT Goals Patient Stated Goal: get back to doing everything myself OT Goal Formulation: With patient Time For Goal Achievement: 10/26/18 Potential to Achieve Goals: Good  Plan Discharge plan remains appropriate    Co-evaluation                 AM-PAC PT "6 Clicks" Daily Activity      Outcome Measure   Help from another person eating meals?: None Help from another person taking care of personal grooming?: None Help from another person toileting, which includes using toliet, bedpan, or urinal?: A Little Help from another person bathing (including washing, rinsing, drying)?: A Little Help from another person to put on and taking off regular upper body clothing?: None Help from another person to put on and taking off regular lower body clothing?: A Little 6 Click Score: 21    End of Session Equipment Utilized During Treatment: Other (comment)(interpretor on a stick--Christian)  OT Visit Diagnosis: Other abnormalities of gait and mobility (R26.89);Pain;Muscle weakness (generalized) (M62.81) Pain - Right/Left: Right Pain - part of body: Hip(back )   Activity Tolerance Patient tolerated treatment well   Patient Left in bed;with call bell/phone within reach;with bed alarm set   Nurse Communication          Time: 3790-2409 OT Time Calculation (min): 25 min  Charges: OT General Charges $OT Visit: 1 Visit OT Treatments $Self Care/Home Management : 23-37 mins  Chrys Racer, OTR/L ascom [phone] 10/13/18, 10:30 AM

## 2018-10-13 NOTE — Progress Notes (Signed)
[facility] [place] NAME: Martin Gilbert    MR#:  [id number]  DATE OF BIRTH:  [birth year]  SUBJECTIVE:  Patient examined with the help of a Spanish interpreter.  States he had one episode of vomiting and one bowel movement last night.  No additional episodes of vomiting.  He endorses mild lower back pain at rest and significant low back pain when walking.  No fevers, no chills, no shortness of breath, no cough.  REVIEW OF SYSTEMS:    Review of Systems  Constitutional: Negative for fever, chills weight loss HENT: Negative for ear pain, nosebleeds, congestion, facial swelling, rhinorrhea, neck pain, neck stiffness and ear discharge.   Respiratory: Negative for cough, shortness of breath, wheezing  Cardiovascular: Negative for chest pain, palpitations and leg swelling.  Gastrointestinal: Negative for heartburn, abdominal pain, vomiting, diarrhea or consitpation Genitourinary: Negative for dysuria, urgency, frequency, hematuria Musculoskeletal: + back pain and left leg pain Neurological: Negative for dizziness, seizures, syncope, focal weakness,  numbness and headaches.  Hematological: Does not bruise/bleed easily.  Psychiatric/Behavioral: Negative for hallucinations, confusion, dysphoric mood  Tolerating Diet: yes  DRUG ALLERGIES:  No Known Allergies  VITALS:  Blood pressure (!) 104/42, pulse 68, temperature (!) 97 F (36.1 C), temperature source Oral, resp. rate 14, height 5\' 11"  (1.803 m), weight 78.7 kg, SpO2 95 %.  PHYSICAL EXAMINATION:  Constitutional: Appears well-developed and well-nourished. No distress. HENT: Normocephalic, atraumatic. Oropharynx is clear and moist.  Eyes: Conjunctivae and EOM are normal. PERRLA, no scleral icterus.  Neck: Normal ROM. Neck supple. No JVD. No tracheal deviation. CVS: RRR, S1/S2 +, no murmurs, no gallops, no carotid bruit.  Pulmonary: Effort and breath sounds normal, no stridor, rhonchi,  wheezes, rales.  Abdominal: Soft. BS +,  no distension, tenderness, rebound or guarding.  Musculoskeletal: +severe tenderness lower lumbar spine and sacral area, no edema.  Neuro: Alert. CN 2-12 grossly intact. No focal deficits. +global weakness. Skin: Skin is warm and dry. No rash noted. Psychiatric: Normal mood and affect.   LABORATORY PANEL:   CBC Recent Labs  Lab 10/13/18 0418  WBC 8.7  HGB 9.7*  HCT 29.9*  PLT 245   ------------------------------------------------------------------------------------------------------------------  Chemistries  Recent Labs  Lab 10/10/18 1925  10/13/18 0418  NA 139   < > 136  K 3.9   < > 4.8  CL 102   < > 99  CO2 27   < > 27  GLUCOSE 118*   < > 80  BUN 22   < > 61*  CREATININE 4.01*   < > 8.30*  CALCIUM 8.5*   < > 8.3*  AST 26  --   --   ALT 11  --   --   ALKPHOS 219*  --   --   BILITOT 1.1  --   --    < > = values in this interval not displayed.   ------------------------------------------------------------------------------------------------------------------  Cardiac Enzymes Recent Labs  Lab 10/10/18 1925 10/11/18 0434  TROPONINI 0.13* 0.14*   ------------------------------------------------------------------------------------------------------------------  RADIOLOGY:  Dg Chest 1 View  Result Date: 10/12/2018 CLINICAL DATA:  Nausea, vomiting and upper abdominal pain for 5 days. Patient underwent sacroplasty 12 days ago. History of anemia, chronic kidney disease on hemodialysis, diabetes and colitis. EXAM: CHEST  1 VIEW COMPARISON:  11/25/2017 portable chest. FINDINGS: 1551 hours. The hemodialysis catheter has been removed. There is stable atelectasis or scarring at the left lung base. The right lung remains clear. There is no  pleural effusion or pneumothorax. The heart size and mediastinal contours are stable. IMPRESSION: No acute cardiopulmonary process. Stable left basilar scarring or atelectasis. Electronically Signed    By: Richardean Sale M.D.   On: 10/12/2018 16:01     ASSESSMENT AND PLAN:   77 year old male with end-stage renal disease on hemodialysis, recent closed fracture of the sacrum and kyphoplasty essential hypertension who presents to the emergency room due to generalized weakness with nausea and vomiting and unable to walk/bear weight on the left leg.  1.  Recent sacral fracture status post kyphoplasty and inability to ambulate: -Seen by PT today, who recommended SNF, however patient has not had SNF-qualifying stay -Plan for discharge home with home health -CSW consult  2.  Nausea/vomiting with acute viral gastroenteritis- improved, although he did have an episode of vomiting last night. -zofran prn  3. Low grade fever- had a temp to 100.30F yesterday, but no additional fevers. CXR negative. Unable to obtain UA, as patient does not make urine. Procalcitonin elevated, but patient not having any infectious symptoms. Likely just due to viral gastroenteritis. -Monitor  4.  Elevated troponin: Troponins are flat and this is due to poor renal clearance.  Patient has ruled out for ACS.  5.  End-stage renal disease on hemodialysis: Continue Tuesday, Thursday and Saturday dialysis -Plan for HD today  6.  Diabetes: Continue current regimen with sliding scale/ADA diet  7.  Hyperlipidemia: Continue statin  8.  History of gout: Continue allopurinol  9.  Essential hypertension: Continue hydralazine and metoprolol  10.  BPH: Continue Flomax  Management plans discussed with the patient and he is in agreement.  CODE STATUS: Full  TOTAL TIME TAKING CARE OF THIS PATIENT: 33 minutes.   POSSIBLE D/C tomorrow, DEPENDING ON CLINICAL CONDITION.   Berna Spare Martin Gilbert M.D on 10/13/2018 at 2:08 PM  Between 7am to 6pm - Pager [phone] After 6pm go to [email or web address] - password [email or web address] [facility]  Office  [phone]  CC: Primary care physician; Martin Median, MD  Note:  This dictation was prepared with Dragon dictation along with smaller phrase technology. Any transcriptional errors that result from this process are unintentional.

## 2018-10-13 NOTE — Clinical Social Work Note (Signed)
CSW consulted for short term rehab. CSW spoke with physician and physician intends to discharge tomorrow. Patient will not have a qualifying stay in order for medicare to cover short term rehab. RN CM to discuss with patient returning home with home health that is already in place.  Shela Leff MSW,LCSW [phone]

## 2018-10-14 ENCOUNTER — Encounter: Payer: Self-pay | Admitting: *Deleted

## 2018-10-14 LAB — RENAL FUNCTION PANEL
ANION GAP: 11 (ref 5–15)
Albumin: 2.5 g/dL — ABNORMAL LOW (ref 3.5–5.0)
BUN: 32 mg/dL — ABNORMAL HIGH (ref 8–23)
CO2: 29 mmol/L (ref 22–32)
Calcium: 8.1 mg/dL — ABNORMAL LOW (ref 8.9–10.3)
Chloride: 98 mmol/L (ref 98–111)
Creatinine, Ser: 5.88 mg/dL — ABNORMAL HIGH (ref 0.61–1.24)
GFR, EST AFRICAN AMERICAN: 10 mL/min — AB (ref >60)
GFR, EST NON AFRICAN AMERICAN: 8 mL/min — AB (ref >60)
Glucose, Bld: 91 mg/dL (ref 70–99)
PHOSPHORUS: 2.7 mg/dL (ref 2.5–4.6)
POTASSIUM: 3.8 mmol/L (ref 3.5–5.1)
SODIUM: 138 mmol/L (ref 135–145)

## 2018-10-14 LAB — CBC
HEMATOCRIT: 30.2 % — AB (ref 39.0–52.0)
HEMOGLOBIN: 9.8 g/dL — AB (ref 13.0–17.0)
MCH: 31.5 pg (ref 26.0–34.0)
MCHC: 32.5 g/dL (ref 30.0–36.0)
MCV: 97.1 fL (ref 80.0–100.0)
NRBC: 0 % (ref 0.0–0.2)
Platelets: 241 10*3/uL (ref 150–400)
RBC: 3.11 MIL/uL — AB (ref 4.22–5.81)
RDW: 13.2 % (ref 11.5–15.5)
WBC: 6.1 10*3/uL (ref 4.0–10.5)

## 2018-10-14 LAB — GLUCOSE, CAPILLARY: Glucose-Capillary: 89 mg/dL (ref 70–99)

## 2018-10-14 MED ORDER — RENA-VITE PO TABS: 1.0000 | ORAL_TABLET | Freq: Every day | ORAL | 0 refills | Status: DC

## 2018-10-14 NOTE — Discharge Instructions (Signed)
¡  Fue un placer conocerte durante esta hospitalizacin! Viniste al hospital porque tenas nuseas y vmitos. Creemos que tuviste una infeccin viral de tus intestinos. Hemos pedido servicios de Film/video editor para usted, para que pueda seguir mejorando en casa. Por favor, asegrese de hacer un seguimiento con su mdico de atencin primaria y el cirujano ortopdico (Doctor Rudene Christians).

## 2018-10-14 NOTE — Care Management Note (Signed)
Case Management Note  Patient Details  Name: Martin Gilbert MRN: [id number] Date of Birth: 20-Apr-1941   Patient to discharge today. [name] dialysis liaison notified of discharge.  Home health resumption orders are in .  Malachy Mood with [facility] notified of discharge.   Subjective/Objective:                    Action/Plan:   Expected Discharge Date:  10/14/18               Expected Discharge Plan:  [place]  In-House Referral:     Discharge planning Services  CM Consult  Post Acute Care Choice:  Resumption of Svcs/PTA Provider Choice offered to:  Adult Children  DME Arranged:    DME Agency:     HH Arranged:  PT, Nurse's Aide [place] Agency:  [facility]  Status of Service:  Completed, signed off  If discussed at Long Length of Stay Meetings, dates discussed:    Additional Comments:  Beverly Sessions, RN 10/14/2018, 10:15 AM

## 2018-10-14 NOTE — Discharge Summary (Signed)
[facility] [place] NAME: Martin Gilbert    MR#:  [id number]  DATE OF BIRTH:  [birth year]  DATE OF ADMISSION:  10/10/2018   ADMITTING PHYSICIAN: Amelia Jo, MD  DATE OF DISCHARGE: 10/14/2018  1:07 PM  PRIMARY CARE PHYSICIAN: [name], MD   ADMISSION DIAGNOSIS:  Abdominal pain, unspecified abdominal location [R10.9] Intractable vomiting with nausea, unspecified vomiting type [R11.2] DISCHARGE DIAGNOSIS:  Active Problems:   Intractable nausea and vomiting   Acute gastroenteritis  SECONDARY DIAGNOSIS:   Past Medical History:  Diagnosis Date  . Anemia   . Back pain   . BPH (benign prostatic hyperplasia)   . CKD (chronic kidney disease), stage IV ([place])   . Clostridium difficile colitis   . Diabetes mellitus without complication ([place])   . GERD (gastroesophageal reflux disease)   . HTN (hypertension)   . Hyperlipidemia    HOSPITAL COURSE:   Martin Gilbert is a 77 year old male presenting to the ED with abdominal pain and intractable nausea and vomiting.  He was felt to have acute gastroenteritis and was admitted for further management.  Acute viral gastroenteritis- vomiting and abdominal pain resolved on the day of discharge -Treated with IV antiemetics  Recent sacral fracture status post kyphoplasty and inability to ambulate: -PT recommended SNF, however patient did not have SNF qualifying hospital stay -Discharged home with home health services  Elevated troponin: Noted on admission.  Troponins were flat and the elevation was felt to be due to poor renal clearance.  Patient has ruled out for ACS.  No chest pain this admission.   ESRD on HD- received HD TTS  DISCHARGE CONDITIONS:  Acute viral gastroenteritis-resolved Recent sacral fracture status post kyphoplasty ESRD on HD Type 2 diabetes Hyperlipidemia History of gout Essential hypertension BPH CONSULTS OBTAINED:  Treatment Team:  Anthonette Legato, MD DRUG ALLERGIES:  No Known Allergies DISCHARGE MEDICATIONS:   Allergies as of 10/14/2018   No Known Allergies     Medication List    STOP taking these medications   Melatonin 5 MG Tabs     TAKE these medications   acetaminophen 325 MG tablet Commonly known as:  TYLENOL Take 2 tablets (650 mg total) by mouth every 6 (six) hours as needed for mild pain (or Fever >/= 101).   allopurinol 100 MG tablet Commonly known as:  ZYLOPRIM Take 100 mg by mouth daily.   aspirin 81 MG tablet Take 81 mg by mouth daily.   atorvastatin 20 MG tablet Commonly known as:  LIPITOR Take 20 mg by mouth daily.   ferrous sulfate 325 (65 FE) MG tablet Take 325 mg by mouth 2 (two) times daily with a meal.   glimepiride 2 MG tablet Commonly known as:  AMARYL Take 2 mg by mouth 2 (two) times daily.   hydrALAZINE 50 MG tablet Commonly known as:  APRESOLINE Take 50 mg by mouth 3 (three) times daily.   HYDROcodone-acetaminophen 5-325 MG tablet Commonly known as:  NORCO/VICODIN Take 1 tablet by mouth every 6 (six) hours as needed for moderate pain or severe pain.   lidocaine-prilocaine cream Commonly known as:  EMLA Apply 1 application topically daily as needed.   metoprolol succinate 25 MG 24 hr tablet Commonly known as:  TOPROL-XL Take 25 mg by mouth daily.   multivitamin Tabs tablet Take 1 tablet by mouth at bedtime.   oxyCODONE-acetaminophen 5-325 MG tablet Commonly known as:  PERCOCET/ROXICET Take 1 tablet by mouth every 6 (six) hours  as needed for moderate pain or severe pain.   pantoprazole 40 MG tablet Commonly known as:  PROTONIX Take 1 tablet (40 mg total) by mouth daily.   REFRESH OP Place 1 drop into both eyes 2 (two) times daily.   sodium bicarbonate 650 MG tablet Take 650 mg by mouth 2 (two) times daily.   tamsulosin 0.4 MG Caps capsule Commonly known as:  FLOMAX Take 1 capsule (0.4 mg total) by mouth daily after supper.   Vitamin D (Cholecalciferol)  1000 units Tabs Take 1 tablet by mouth daily.        DISCHARGE INSTRUCTIONS:  1.  Follow-up with PCP in 1 to 2 weeks 2.  Follow-up with orthopedic surgeon in 1 to 2 weeks DIET:  Renal diet DISCHARGE CONDITION:  Stable ACTIVITY:  Activity as tolerated OXYGEN:  Home Oxygen: No.  Oxygen Delivery: room air DISCHARGE LOCATION:  home   If you experience worsening of your admission symptoms, develop shortness of breath, life threatening emergency, suicidal or homicidal thoughts you must seek medical attention immediately by calling 911 or calling your MD immediately  if symptoms less severe.  You Must read complete instructions/literature along with all the possible adverse reactions/side effects for all the Medicines you take and that have been prescribed to you. Take any new Medicines after you have completely understood and accpet all the possible adverse reactions/side effects.   Please note  You were cared for by a hospitalist during your hospital stay. If you have any questions about your discharge medications or the care you received while you were in the hospital after you are discharged, you can call the unit and asked to speak with the hospitalist on call if the hospitalist that took care of you is not available. Once you are discharged, your primary care physician will handle any further medical issues. Please note that NO REFILLS for any discharge medications will be authorized once you are discharged, as it is imperative that you return to your primary care physician (or establish a relationship with a primary care physician if you do not have one) for your aftercare needs so that they can reassess your need for medications and monitor your lab values.    On the day of Discharge:  VITAL SIGNS:  Blood pressure (!) 126/51, pulse 69, temperature 98.9 F (37.2 C), temperature source Oral, resp. rate (!) 22, height 5\' 11"  (1.803 m), weight 77.6 kg, SpO2 92 %. PHYSICAL  EXAMINATION:  Constitutional: Appears well-developed and well-nourished. No distress. HENT: Normocephalic, atraumatic. Oropharynx is clear and moist.  Eyes: Conjunctivae and EOM are normal. PERRLA, no scleral icterus.  Neck: Normal ROM. Neck supple. No JVD. No tracheal deviation. CVS: RRR, S1/S2 +, no murmurs, no gallops, no carotid bruit.  Pulmonary: Effort and breath sounds normal, no stridor, rhonchi, wheezes, rales.  Abdominal: Soft. BS +,  no distension, tenderness, rebound or guarding.  Musculoskeletal: + Moderate tenderness lower lumbar spine and sacral area, no edema.  Neuro: Alert. CN 2-12 grossly intact. No focal deficits. +global weakness. Skin: Skin is warm and dry. No rash noted. Psychiatric: Normal mood and affect.  DATA REVIEW:   CBC Recent Labs  Lab 10/14/18 0439  WBC 6.1  HGB 9.8*  HCT 30.2*  PLT 241    Chemistries  Recent Labs  Lab 10/10/18 1925  10/14/18 0439  NA 139   < > 138  K 3.9   < > 3.8  CL 102   < > 98  CO2 27   < >  29  GLUCOSE 118*   < > 91  BUN 22   < > 32*  CREATININE 4.01*   < > 5.88*  CALCIUM 8.5*   < > 8.1*  AST 26  --   --   ALT 11  --   --   ALKPHOS 219*  --   --   BILITOT 1.1  --   --    < > = values in this interval not displayed.     Microbiology Results  Results for orders placed or performed during the hospital encounter of 09/29/18  MRSA PCR Screening     Status: None   Collection Time: 09/29/18  9:54 PM  Result Value Ref Range Status   MRSA by PCR NEGATIVE NEGATIVE Final    Comment:        The GeneXpert MRSA Assay (FDA approved for NASAL specimens only), is one component of a comprehensive MRSA colonization surveillance program. It is not intended to diagnose MRSA infection nor to guide or monitor treatment for MRSA infections. Performed at [facility], [address] [place], [place] [address]     RADIOLOGY:  No results found.   Management plans discussed with the patient, family and they are  in agreement.  CODE STATUS: Full Code   TOTAL TIME TAKING CARE OF THIS PATIENT: 35 minutes.    Berna Spare Vanessa Alesi M.D on 10/14/2018 at 2:01 PM  Between 7am to 6pm - Pager [phone]  After 6pm go to [email or web address] - Proofreader  [facility]  Office  [phone]  CC: Primary care physician; [name], MD   Note: This dictation was prepared with Dragon dictation along with smaller phrase technology. Any transcriptional errors that result from this process are unintentional.

## 2018-11-03 ENCOUNTER — Encounter (INDEPENDENT_AMBULATORY_CARE_PROVIDER_SITE_OTHER): Payer: Self-pay

## 2018-11-04 ENCOUNTER — Encounter (INDEPENDENT_AMBULATORY_CARE_PROVIDER_SITE_OTHER): Payer: Self-pay

## 2018-11-11 ENCOUNTER — Other Ambulatory Visit (INDEPENDENT_AMBULATORY_CARE_PROVIDER_SITE_OTHER): Payer: Self-pay | Admitting: Vascular Surgery

## 2018-11-16 ENCOUNTER — Encounter
Admission: RE | Disposition: A | Discharge status: Home / Self Care | Payer: Self-pay | Source: Ambulatory Visit | Attending: Vascular Surgery

## 2018-11-16 ENCOUNTER — Ambulatory Visit
Admission: RE | Admit: 2018-11-16 | Discharge: 2018-11-16 | Disposition: A | Discharge status: Home / Self Care | Payer: Medicare Other | Source: Ambulatory Visit | Attending: Vascular Surgery | Admitting: Vascular Surgery

## 2018-11-16 DIAGNOSIS — Z538 Procedure and treatment not carried out for other reasons: Secondary | ICD-10-CM | POA: Diagnosis not present

## 2018-11-16 DIAGNOSIS — N186 End stage renal disease: Secondary | ICD-10-CM | POA: Insufficient documentation

## 2018-11-16 SURGERY — A/V FISTULAGRAM
Anesthesia: Moderate Sedation | Laterality: Left

## 2018-11-16 MED ORDER — HYDROMORPHONE HCL 1 MG/ML IJ SOLN: 1.0000 mg | Freq: Once | INTRAMUSCULAR | Status: DC | PRN

## 2018-11-16 MED ORDER — CEFAZOLIN SODIUM-DEXTROSE 1-4 GM/50ML-% IV SOLN: 1.0000 g | Freq: Once | INTRAVENOUS | Status: DC

## 2018-11-16 MED ORDER — FAMOTIDINE 20 MG PO TABS: 40.0000 mg | ORAL_TABLET | ORAL | Status: DC | PRN

## 2018-11-16 MED ORDER — SODIUM CHLORIDE 0.9 % IV SOLN: INTRAVENOUS | Status: DC

## 2018-11-16 MED ORDER — METHYLPREDNISOLONE SODIUM SUCC 125 MG IJ SOLR: 125.0000 mg | INTRAMUSCULAR | Status: DC | PRN

## 2018-11-16 MED ORDER — ONDANSETRON HCL 4 MG/2ML IJ SOLN: 4.0000 mg | Freq: Four times a day (QID) | INTRAMUSCULAR | Status: DC | PRN

## 2018-11-16 NOTE — Progress Notes (Signed)
Patient ate breakfast before coming in to have his procedure today.  He was offered staying later to do the procedure following 6 hours of n.p.o. but he declined and chose to go home and reschedule for another day.

## 2018-11-16 NOTE — OR Nursing (Signed)
Dr Lucky Cowboy notified that pt ate taco at 0700 and did not want to wait until 1 pm for his procedure. Office called to reschedule.

## 2018-11-16 NOTE — OR Nursing (Signed)
Pt also reported he had normal dialysis on Saturday.

## 2018-11-18 ENCOUNTER — Encounter (INDEPENDENT_AMBULATORY_CARE_PROVIDER_SITE_OTHER): Payer: Self-pay

## 2018-11-23 ENCOUNTER — Ambulatory Visit (INDEPENDENT_AMBULATORY_CARE_PROVIDER_SITE_OTHER): Payer: Medicare Other | Admitting: Vascular Surgery

## 2018-11-23 ENCOUNTER — Other Ambulatory Visit (INDEPENDENT_AMBULATORY_CARE_PROVIDER_SITE_OTHER): Payer: Self-pay | Admitting: Vascular Surgery

## 2018-11-23 ENCOUNTER — Encounter (INDEPENDENT_AMBULATORY_CARE_PROVIDER_SITE_OTHER): Payer: Medicare Other

## 2018-11-25 ENCOUNTER — Encounter
Admission: RE | Disposition: A | Discharge status: Home / Self Care | Payer: Self-pay | Source: Ambulatory Visit | Attending: Vascular Surgery

## 2018-11-25 ENCOUNTER — Ambulatory Visit
Admission: RE | Admit: 2018-11-25 | Discharge: 2018-11-25 | Disposition: A | Discharge status: Home / Self Care | Payer: Medicare Other | Source: Ambulatory Visit | Attending: Vascular Surgery | Admitting: Vascular Surgery

## 2018-11-25 ENCOUNTER — Other Ambulatory Visit: Payer: Self-pay

## 2018-11-25 ENCOUNTER — Encounter: Payer: Self-pay | Admitting: Vascular Surgery

## 2018-11-25 DIAGNOSIS — I120 Hypertensive chronic kidney disease with stage 5 chronic kidney disease or end stage renal disease: Secondary | ICD-10-CM | POA: Diagnosis not present

## 2018-11-25 DIAGNOSIS — Z8249 Family history of ischemic heart disease and other diseases of the circulatory system: Secondary | ICD-10-CM | POA: Insufficient documentation

## 2018-11-25 DIAGNOSIS — Z992 Dependence on renal dialysis: Secondary | ICD-10-CM | POA: Insufficient documentation

## 2018-11-25 DIAGNOSIS — K219 Gastro-esophageal reflux disease without esophagitis: Secondary | ICD-10-CM | POA: Diagnosis not present

## 2018-11-25 DIAGNOSIS — T82858A Stenosis of vascular prosthetic devices, implants and grafts, initial encounter: Secondary | ICD-10-CM | POA: Insufficient documentation

## 2018-11-25 DIAGNOSIS — Y832 Surgical operation with anastomosis, bypass or graft as the cause of abnormal reaction of the patient, or of later complication, without mention of misadventure at the time of the procedure: Secondary | ICD-10-CM | POA: Diagnosis not present

## 2018-11-25 DIAGNOSIS — E1122 Type 2 diabetes mellitus with diabetic chronic kidney disease: Secondary | ICD-10-CM | POA: Diagnosis not present

## 2018-11-25 DIAGNOSIS — Z955 Presence of coronary angioplasty implant and graft: Secondary | ICD-10-CM | POA: Insufficient documentation

## 2018-11-25 DIAGNOSIS — N400 Enlarged prostate without lower urinary tract symptoms: Secondary | ICD-10-CM | POA: Insufficient documentation

## 2018-11-25 DIAGNOSIS — N186 End stage renal disease: Secondary | ICD-10-CM | POA: Insufficient documentation

## 2018-11-25 DIAGNOSIS — E785 Hyperlipidemia, unspecified: Secondary | ICD-10-CM | POA: Diagnosis not present

## 2018-11-25 HISTORY — PX: A/V FISTULAGRAM: CATH118298

## 2018-11-25 LAB — POTASSIUM (ARMC VASCULAR LAB ONLY): Potassium (ARMC vascular lab): 4.9 (ref 3.5–5.1)

## 2018-11-25 LAB — GLUCOSE, CAPILLARY: Glucose-Capillary: 102 mg/dL — ABNORMAL HIGH (ref 70–99)

## 2018-11-25 SURGERY — A/V FISTULAGRAM
Anesthesia: Moderate Sedation | Laterality: Left

## 2018-11-25 MED ORDER — MIDAZOLAM HCL 5 MG/5ML IJ SOLN
INTRAMUSCULAR | Status: AC
  Filled 2018-11-25: qty 5

## 2018-11-25 MED ORDER — HEPARIN (PORCINE) IN NACL 1000-0.9 UT/500ML-% IV SOLN
INTRAVENOUS | Status: AC
  Filled 2018-11-25: qty 500

## 2018-11-25 MED ORDER — FENTANYL CITRATE (PF) 100 MCG/2ML IJ SOLN
INTRAMUSCULAR | Status: AC
  Filled 2018-11-25: qty 2

## 2018-11-25 MED ORDER — HEPARIN (PORCINE) IN NACL 1000-0.9 UT/500ML-% IV SOLN
INTRAVENOUS | Status: AC
  Filled 2018-11-25: qty 1000

## 2018-11-25 MED ORDER — CEFAZOLIN SODIUM-DEXTROSE 1-4 GM/50ML-% IV SOLN
1.0000 g | Freq: Once | INTRAVENOUS | Status: AC
  Administered 2018-11-25: 1 g via INTRAVENOUS

## 2018-11-25 MED ORDER — HEPARIN SODIUM (PORCINE) 1000 UNIT/ML IJ SOLN
INTRAMUSCULAR | Status: DC | PRN
  Administered 2018-11-25: 3000 [IU] via INTRAVENOUS

## 2018-11-25 MED ORDER — LIDOCAINE-EPINEPHRINE (PF) 1 %-1:200000 IJ SOLN
INTRAMUSCULAR | Status: AC
  Filled 2018-11-25: qty 10

## 2018-11-25 MED ORDER — SODIUM CHLORIDE 0.9 % IV SOLN
INTRAVENOUS | Status: DC
  Administered 2018-11-25: 09:00:00 via INTRAVENOUS

## 2018-11-25 MED ORDER — METHYLPREDNISOLONE SODIUM SUCC 125 MG IJ SOLR: 125.0000 mg | INTRAMUSCULAR | Status: DC | PRN

## 2018-11-25 MED ORDER — FENTANYL CITRATE (PF) 100 MCG/2ML IJ SOLN
INTRAMUSCULAR | Status: DC | PRN
  Administered 2018-11-25 (×3): 25 ug via INTRAVENOUS
  Administered 2018-11-25: 50 ug via INTRAVENOUS

## 2018-11-25 MED ORDER — CEFAZOLIN SODIUM-DEXTROSE 1-4 GM/50ML-% IV SOLN
INTRAVENOUS | Status: AC
  Filled 2018-11-25: qty 50

## 2018-11-25 MED ORDER — IOPAMIDOL (ISOVUE-300) INJECTION 61%
INTRAVENOUS | Status: DC | PRN
  Administered 2018-11-25: 30 mL via INTRAVENOUS

## 2018-11-25 MED ORDER — MIDAZOLAM HCL 2 MG/2ML IJ SOLN
INTRAMUSCULAR | Status: DC | PRN
  Administered 2018-11-25 (×2): 1 mg via INTRAVENOUS
  Administered 2018-11-25: 2 mg via INTRAVENOUS

## 2018-11-25 MED ORDER — ONDANSETRON HCL 4 MG/2ML IJ SOLN: 4.0000 mg | Freq: Four times a day (QID) | INTRAMUSCULAR | Status: DC | PRN

## 2018-11-25 MED ORDER — FAMOTIDINE 20 MG PO TABS: 40.0000 mg | ORAL_TABLET | ORAL | Status: DC | PRN

## 2018-11-25 MED ORDER — HEPARIN SODIUM (PORCINE) 1000 UNIT/ML IJ SOLN
INTRAMUSCULAR | Status: AC
  Filled 2018-11-25: qty 1

## 2018-11-25 MED ORDER — HYDROMORPHONE HCL 1 MG/ML IJ SOLN: 1.0000 mg | Freq: Once | INTRAMUSCULAR | Status: DC | PRN

## 2018-11-25 SURGICAL SUPPLY — 11 items
BALLN LUTONIX DCB 4X80X130 (BALLOONS) ×3
BALLOON LUTONIX DCB 4X80X130 (BALLOONS) ×1 IMPLANT
CANNULA 5F STIFF (CANNULA) ×3 IMPLANT
CATH BEACON 5 .035 40 KMP TP (CATHETERS) ×1 IMPLANT
CATH BEACON 5 .038 40 KMP TP (CATHETERS) ×2
DEVICE PRESTO INFLATION (MISCELLANEOUS) ×3 IMPLANT
DRAPE BRACHIAL (DRAPES) ×3 IMPLANT
PACK ANGIOGRAPHY (CUSTOM PROCEDURE TRAY) ×3 IMPLANT
SHEATH BRITE TIP 6FRX5.5 (SHEATH) ×3 IMPLANT
SUT MNCRL AB 4-0 PS2 18 (SUTURE) ×3 IMPLANT
WIRE MAGIC TOR.035 180C (WIRE) ×6 IMPLANT

## 2018-11-25 NOTE — H&P (Addendum)
[facility] Admission History & Physical  MRN : [id number]  Martin Gilbert is a 77 y.o. (30-Nov-1941) male who presents with chief complaint of No chief complaint on file. Marland Kitchen  History of Present Illness: I am asked to evaluate the patient by the dialysis center. The patient was sent here because they were unable to achieve adequate dialysis this morning. Furthermore the Center states there is very poor thrill and bruit. The patient states there there have been increasing problems with the access, such as "pulling clots" during dialysis and prolonged bleeding after decannulation. The patient estimates these problems have been going on for several weeks. The patient is unaware of any other change.  Patient denies pain or tenderness overlying the access.  There is no pain with dialysis.  The patient denies hand pain or finger pain consistent with steal syndrome.   There have been any past interventions or declots of this access.  The patient is not chronically hypotensive on dialysis.  Current Facility-Administered Medications  Medication Dose Route Frequency Provider Last Rate Last Dose  . 0.9 %  sodium chloride infusion   Intravenous Continuous Stegmayer, Kimberly A, PA-C      . ceFAZolin (ANCEF) IVPB 1 g/50 mL premix  1 g Intravenous Once Stegmayer, Kimberly A, PA-C      . famotidine (PEPCID) tablet 40 mg  40 mg Oral PRN Stegmayer, Janalyn Harder, PA-C      . HYDROmorphone (DILAUDID) injection 1 mg  1 mg Intravenous Once PRN Stegmayer, Kimberly A, PA-C      . methylPREDNISolone sodium succinate (SOLU-MEDROL) 125 mg/2 mL injection 125 mg  125 mg Intravenous PRN Stegmayer, Kimberly A, PA-C      . ondansetron (ZOFRAN) injection 4 mg  4 mg Intravenous Q6H PRN Stegmayer, Janalyn Harder, PA-C        Past Medical History:  Diagnosis Date  . Anemia   . Back pain   . BPH (benign prostatic hyperplasia)   . CKD (chronic kidney disease), stage IV ([place])   . Clostridium  difficile colitis   . Diabetes mellitus without complication ([place])   . GERD (gastroesophageal reflux disease)   . HTN (hypertension)   . Hyperlipidemia     Past Surgical History:  Procedure Laterality Date  . A/V FISTULAGRAM Left 04/20/2018   Procedure: A/V FISTULAGRAM;  Surgeon: Algernon Huxley, MD;  Location: [facility];  Service: Cardiovascular;  Laterality: Left;  . AV FISTULA PLACEMENT Left 06/13/2017   Procedure: ARTERIOVENOUS (AV) FISTULA CREATION ( RADIAL CEPHALIC );  Surgeon: Katha Cabal, MD;  Location: [facility];  Service: Vascular;  Laterality: Left;  . CATARACT EXTRACTION     one eye not sure which  . DIALYSIS/PERMA CATHETER INSERTION N/A 04/03/2017   Procedure: Dialysis/Perma Catheter Insertion;  Surgeon: Algernon Huxley, MD;  Location: [facility];  Service: Cardiovascular;  Laterality: N/A;  . DIALYSIS/PERMA CATHETER INSERTION N/A 08/06/2017   Procedure: DIALYSIS/PERMA CATHETER INSERTION;  Surgeon: Algernon Huxley, MD;  Location: [facility];  Service: Cardiovascular;  Laterality: N/A;  . DIALYSIS/PERMA CATHETER REMOVAL N/A 08/04/2017   Procedure: DIALYSIS/PERMA CATHETER REMOVAL;  Surgeon: Algernon Huxley, MD;  Location: [facility];  Service: Cardiovascular;  Laterality: N/A;  . DIALYSIS/PERMA CATHETER REMOVAL N/A 12/31/2017   Procedure: DIALYSIS/PERMA CATHETER REMOVAL;  Surgeon: Algernon Huxley, MD;  Location: [facility];  Service: Cardiovascular;  Laterality: N/A;  . hernia repair    . SACROPLASTY N/A 09/30/2018  Procedure: SACROPLASTY;  Surgeon: Hessie Knows, MD;  Location: [facility];  Service: Orthopedics;  Laterality: N/A;  . UPPER EXTREMITY ANGIOGRAPHY Left 09/05/2017   Procedure: Upper Extremity Angiography;  Surgeon: Katha Cabal, MD;  Location: [facility];  Service: Cardiovascular;  Laterality: Left;    Social History Social History   Tobacco Use  . Smoking status: Never Smoker  . Smokeless tobacco: Never Used   Substance Use Topics  . Alcohol use: No    Comment: Drank in the past, but has stopped for several years.   . Drug use: No    Family History Family History  Problem Relation Age of Onset  . Hypertension Mother   . Diabetes Neg Hx     No family history of bleeding or clotting disorders, autoimmune disease or porphyria  No Known Allergies   REVIEW OF SYSTEMS (Negative unless checked)  Constitutional: [] Weight loss  [] Fever  [] Chills Cardiac: [] Chest pain   [] Chest pressure   [] Palpitations   [] Shortness of breath when laying flat   [] Shortness of breath at rest   [x] Shortness of breath with exertion. Vascular:  [] Pain in legs with walking   [] Pain in legs at rest   [] Pain in legs when laying flat   [] Claudication   [] Pain in feet when walking  [] Pain in feet at rest  [] Pain in feet when laying flat   [] History of DVT   [] Phlebitis   [] Swelling in legs   [] Varicose veins   [] Non-healing ulcers Pulmonary:   [] Uses home oxygen   [] Productive cough   [] Hemoptysis   [] Wheeze  [] COPD   [] Asthma Neurologic:  [] Dizziness  [] Blackouts   [] Seizures   [] History of stroke   [] History of TIA  [] Aphasia   [] Temporary blindness   [] Dysphagia   [] Weakness or numbness in arms   [] Weakness or numbness in legs Musculoskeletal:  [x] Arthritis   [] Joint swelling   [] Joint pain   [] Low back pain Hematologic:  [] Easy bruising  [] Easy bleeding   [] Hypercoagulable state   [] Anemic  [] Hepatitis Gastrointestinal:  [] Blood in stool   [] Vomiting blood  [] Gastroesophageal reflux/heartburn   [] Difficulty swallowing. Genitourinary:  [x] Chronic kidney disease   [] Difficult urination  [] Frequent urination  [] Burning with urination   [] Blood in urine Skin:  [] Rashes   [] Ulcers   [] Wounds Psychological:  [] History of anxiety   []  History of major depression.  Physical Examination  There were no vitals filed for this visit. There is no height or weight on file to calculate BMI. Gen: WD/WN, NAD Head: [place]/AT, No  temporalis wasting.  Ear/Nose/Throat: Hearing grossly intact, nares w/o erythema or drainage, oropharynx w/o Erythema/Exudate,  Eyes: Conjunctiva clear, sclera non-icteric Neck: Trachea midline.  No JVD.  Pulmonary:  Good air movement, respirations not labored, no use of accessory muscles.  Cardiac: RRR, normal S1, S2. Vascular: poor thrill in left radiocephalic AVF Vessel Right Left  Radial Palpable Palpable               Musculoskeletal: M/S 5/5 throughout.  Extremities without ischemic changes.  No deformity or atrophy.  Neurologic: Sensation grossly intact in extremities.  Symmetrical.  Speech is fluent. Motor exam as listed above. Psychiatric: Judgment intact, Mood & affect appropriate for pt's clinical situation. Dermatologic: No rashes or ulcers noted.  No cellulitis or open wounds.    CBC Lab Results  Component Value Date   WBC 6.1 10/14/2018   HGB 9.8 (L) 10/14/2018   HCT 30.2 (L) 10/14/2018   MCV  97.1 10/14/2018   PLT 241 10/14/2018    BMET    Component Value Date/Time   NA 138 10/14/2018 0439   NA 138 04/08/2015 2133   K 3.8 10/14/2018 0439   K 4.0 04/08/2015 2133   CL 98 10/14/2018 0439   CL 108 04/08/2015 2133   CO2 29 10/14/2018 0439   CO2 25 04/08/2015 2133   GLUCOSE 91 10/14/2018 0439   GLUCOSE 181 (H) 04/08/2015 2133   BUN 32 (H) 10/14/2018 0439   BUN 60 (H) 04/08/2015 2133   CREATININE 5.88 (H) 10/14/2018 0439   CREATININE 4.04 (H) 04/08/2015 2133   CALCIUM 8.1 (L) 10/14/2018 0439   CALCIUM 8.5 (L) 04/08/2015 2133   GFRNONAA 8 (L) 10/14/2018 0439   GFRNONAA 14 (L) 04/08/2015 2133   GFRAA 10 (L) 10/14/2018 0439   GFRAA 16 (L) 04/08/2015 2133   CrCl cannot be calculated (Patient's most recent lab result is older than the maximum 21 days allowed.).  COAG Lab Results  Component Value Date   INR 1.05 11/25/2017   INR 1.05 06/04/2017   INR 1.35 03/27/2017    Radiology No results found.  Assessment/Plan 1.  Complication dialysis device  with poorly functioning AV access:  Patient's dialysis access is malfunctioning. The patient will undergo angiography and correction of any problems using interventional techniques with the hope of restoring function to the access.  The risks and benefits were described to the patient.  All questions were answered.  The patient agrees to proceed with angiography and intervention. Potassium will be drawn to ensure that it is an appropriate level prior to performing intervention. 2.  End-stage renal disease requiring hemodialysis:  Patient will continue dialysis therapy without further interruption if a successful intervention is not achieved then a tunneled catheter will be placed. Dialysis has already been arranged. 3.  Hypertension:  Patient will continue medical management; nephrology is following no changes in oral medications. 4.  Diabetes mellitus:  Glucose will be monitored and oral medications been held this morning once the patient has undergone the patient's procedure po intake will be reinitiated and again Accu-Cheks will be used to assess the blood glucose level and treat as needed. The patient will be restarted on the patient's usual hypoglycemic regime     Leotis Pain, MD  11/25/2018 8:29 AM

## 2018-11-25 NOTE — Op Note (Signed)
VEIN AND VASCULAR SURGERY    OPERATIVE NOTE   PROCEDURE: 1.   Left radiocephalic arteriovenous fistula cannulation under ultrasound guidance 2.   Left arm fistulagram including central venogram 3.   Percutaneous transluminal angioplasty of the left radiocephalic anastomosis with 4 mm diameter by 8 cm length Lutonix drug-coated angioplasty balloon  PRE-OPERATIVE DIAGNOSIS: 1. ESRD 2. Poorly functional left radiocephalic AVF  POST-OPERATIVE DIAGNOSIS: same as above   SURGEON: Leotis Pain, MD  ANESTHESIA: local with MCS  ESTIMATED BLOOD LOSS: 5 cc  FINDING(S): 1. 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  The remainder of the fistula was patent with outflow through the basilic vein in the upper arm and no central venous stenosis.  SPECIMEN(S):  None  CONTRAST: 30 cc  FLUORO TIME: 4 minutes  MODERATE CONSCIOUS SEDATION TIME: Approximately 20 minutes with 4 mg of Versed and 125 mcg of Fentanyl   INDICATIONS: Martin Gilbert is a 77 y.o. male who presents with malfunctioning left radiocephalic arteriovenous fistula.  The patient is scheduled for left arm fistulagram.  The patient is aware the risks include but are not limited to: bleeding, infection, thrombosis of the cannulated access, and possible anaphylactic reaction to the contrast.  The patient is aware of the risks of the procedure and elects to proceed forward.  DESCRIPTION: After full informed written consent was obtained, the patient was brought back to the angiography suite and placed supine upon the angiography table.  The patient was connected to monitoring equipment. Moderate conscious sedation was administered with a face to face encounter with the patient throughout the procedure with my supervision of the RN administering medicines and monitoring the patient's vital signs and mental status throughout from  the start of the procedure until the patient was taken to the recovery room. The left arm was prepped and draped in the standard fashion for a percutaneous access intervention.  Under ultrasound guidance, the left radiocephalic arteriovenous fistula was cannulated in a retrograde fashion just below the antecubital fossa with a micropuncture needle under direct ultrasound guidance where it was patent and a permanent image was performed.  The microwire was advanced into the fistula and the needle was exchanged for the a microsheath.  I then upsized to a 6 Fr Sheath and then advance the Kumpe catheter and a Magic torque wire to the anastomosis and imaging was performed.  Hand injections were completed to image the access including the central venous system. This demonstrated high-grade, greater than 90% stenosis of the radiocephalic anastomosis and the cephalic vein a couple of centimeters beyond the anastomosis.  The remainder of the fistula was patent with outflow through the basilic vein in the upper arm and no central venous stenosis..  Based on the images, this patient will need intervention to this perianastomotic stenosis. I then gave the patient 3000 units of intravenous heparin.  I then crossed the stenosis with a Magic Tourqe wire.  Based on the imaging, a 4 mm x 8 cm Lutonix drug-coated angioplasty balloon was selected.  The balloon was centered around the radiocephalic stenosis and inflated to 8 ATM for 1 minute(s).  Following this, there was some mild extravasation from the cephalic vein just beyond the anastomosis and so I reinflated the balloon to 4 atm for 3 minutes to gain hemostasis. On completion imaging, a 25-30% residual stenosis was present and there was no extravasation seen.  There was also a much better  thrill within the AV fistula.     Based on the completion imaging, no further intervention is necessary.  The wire and balloon were removed from the sheath.  A 4-0 Monocryl purse-string  suture was sewn around the sheath.  The sheath was removed while tying down the suture.  A sterile bandage was applied to the puncture site.  COMPLICATIONS: None  CONDITION: Stable   Leotis Pain  11/25/2018 10:04 AM   This note was created with Dragon Medical transcription system. Any errors in dictation are purely unintentional.

## 2019-01-06 ENCOUNTER — Encounter (INDEPENDENT_AMBULATORY_CARE_PROVIDER_SITE_OTHER): Payer: Self-pay

## 2019-01-06 ENCOUNTER — Ambulatory Visit (INDEPENDENT_AMBULATORY_CARE_PROVIDER_SITE_OTHER): Payer: Medicare Other | Admitting: Nurse Practitioner

## 2019-01-06 ENCOUNTER — Other Ambulatory Visit (INDEPENDENT_AMBULATORY_CARE_PROVIDER_SITE_OTHER): Payer: Self-pay | Admitting: Vascular Surgery

## 2019-01-06 ENCOUNTER — Encounter (INDEPENDENT_AMBULATORY_CARE_PROVIDER_SITE_OTHER): Payer: Self-pay | Admitting: Nurse Practitioner

## 2019-01-06 ENCOUNTER — Ambulatory Visit (INDEPENDENT_AMBULATORY_CARE_PROVIDER_SITE_OTHER): Payer: Medicare Other

## 2019-01-06 VITALS — BP 155/59 | HR 67 | Resp 16 | Ht 70.0 in | Wt 171.6 lb

## 2019-01-06 DIAGNOSIS — I10 Essential (primary) hypertension: Secondary | ICD-10-CM

## 2019-01-06 DIAGNOSIS — T829XXS Unspecified complication of cardiac and vascular prosthetic device, implant and graft, sequela: Secondary | ICD-10-CM

## 2019-01-06 DIAGNOSIS — T829XXA Unspecified complication of cardiac and vascular prosthetic device, implant and graft, initial encounter: Secondary | ICD-10-CM | POA: Diagnosis not present

## 2019-01-06 DIAGNOSIS — Z9862 Peripheral vascular angioplasty status: Secondary | ICD-10-CM

## 2019-01-06 DIAGNOSIS — E1165 Type 2 diabetes mellitus with hyperglycemia: Secondary | ICD-10-CM

## 2019-01-06 NOTE — Progress Notes (Signed)
Subjective:    Patient ID: Martin Gilbert, male    DOB: [birth year], 78 y.o.   MRN: [id number] Chief Complaint  Patient presents with  . Follow-up    HPI  Martin Gilbert is a 78 y.o. male The patient returns to the office for followup status post intervention of the dialysis access left radial cephalic AVF. Following the intervention the excess function was unchanged per the patient.  The patient continues to be experiencing increased bleeding times following decannulation and increased recirculation with diminished efficiency of their dialysis. The patient denies an increase in arm swelling. At the present time the patient endorses wrist pain following an episode where he was stuck 8 times in one session.    The patient denies amaurosis fugax or recent TIA symptoms. There are no recent neurological changes noted. The patient denies claudication symptoms or rest pain symptoms. The patient denies history of DVT, PE or superficial thrombophlebitis. The patient denies recent episodes of angina or shortness of breath.   The patient a hemodialysis access post intervention on 11/25/2018.  His flow volume is 381.  Elevated velocities are present from the mid forearm to the distal forearm.  There is still a bridge present in the mid forearm if the cephalic vein that was noted in previous exam on 06/01/2018.  The previous exam also had his flow volume at 1196. Past Medical History:  Diagnosis Date  . Anemia   . Back pain   . BPH (benign prostatic hyperplasia)   . CKD (chronic kidney disease), stage IV ([place])   . Clostridium difficile colitis   . Diabetes mellitus without complication ([place])   . GERD (gastroesophageal reflux disease)   . HTN (hypertension)   . Hyperlipidemia     Past Surgical History:  Procedure Laterality Date  . A/V FISTULAGRAM Left 04/20/2018   Procedure: A/V FISTULAGRAM;  Surgeon: Algernon Huxley, MD;  Location: [facility];  Service: Cardiovascular;   Laterality: Left;  . A/V FISTULAGRAM Left 11/25/2018   Procedure: A/V FISTULAGRAM;  Surgeon: Algernon Huxley, MD;  Location: [facility];  Service: Cardiovascular;  Laterality: Left;  . AV FISTULA PLACEMENT Left 06/13/2017   Procedure: ARTERIOVENOUS (AV) FISTULA CREATION ( RADIAL CEPHALIC );  Surgeon: Katha Cabal, MD;  Location: [facility];  Service: Vascular;  Laterality: Left;  . CATARACT EXTRACTION     one eye not sure which  . DIALYSIS/PERMA CATHETER INSERTION N/A 04/03/2017   Procedure: Dialysis/Perma Catheter Insertion;  Surgeon: Algernon Huxley, MD;  Location: [facility];  Service: Cardiovascular;  Laterality: N/A;  . DIALYSIS/PERMA CATHETER INSERTION N/A 08/06/2017   Procedure: DIALYSIS/PERMA CATHETER INSERTION;  Surgeon: Algernon Huxley, MD;  Location: [facility];  Service: Cardiovascular;  Laterality: N/A;  . DIALYSIS/PERMA CATHETER REMOVAL N/A 08/04/2017   Procedure: DIALYSIS/PERMA CATHETER REMOVAL;  Surgeon: Algernon Huxley, MD;  Location: [facility];  Service: Cardiovascular;  Laterality: N/A;  . DIALYSIS/PERMA CATHETER REMOVAL N/A 12/31/2017   Procedure: DIALYSIS/PERMA CATHETER REMOVAL;  Surgeon: Algernon Huxley, MD;  Location: [facility];  Service: Cardiovascular;  Laterality: N/A;  . hernia repair    . SACROPLASTY N/A 09/30/2018   Procedure: SACROPLASTY;  Surgeon: Hessie Knows, MD;  Location: [facility];  Service: Orthopedics;  Laterality: N/A;  . UPPER EXTREMITY ANGIOGRAPHY Left 09/05/2017   Procedure: Upper Extremity Angiography;  Surgeon: Katha Cabal, MD;  Location: [facility];  Service: Cardiovascular;  Laterality: Left;  Social History   Socioeconomic History  . Marital status: Married    Spouse name: Not on file  . Number of children: Not on file  . Years of education: Not on file  . Highest education level: Not on file  Occupational History  . Occupation: retired  Scientific laboratory technician  . Financial resource strain: Not  on file  . Food insecurity:    Worry: Not on file    Inability: Not on file  . Transportation needs:    Medical: Not on file    Non-medical: Not on file  Tobacco Use  . Smoking status: Never Smoker  . Smokeless tobacco: Never Used  Substance and Sexual Activity  . Alcohol use: No    Comment: Drank in the past, but has stopped for several years.   . Drug use: No  . Sexual activity: Not on file  Lifestyle  . Physical activity:    Days per week: Not on file    Minutes per session: Not on file  . Stress: Not on file  Relationships  . Social connections:    Talks on phone: Not on file    Gets together: Not on file    Attends religious service: Not on file    Active member of club or organization: Not on file    Attends meetings of clubs or organizations: Not on file    Relationship status: Not on file  . Intimate partner violence:    Fear of current or ex partner: Not on file    Emotionally abused: Not on file    Physically abused: Not on file    Forced sexual activity: Not on file  Other Topics Concern  . Not on file  Social History Narrative  . Not on file    Family History  Problem Relation Age of Onset  . Hypertension Mother   . Diabetes Neg Hx     No Known Allergies   Review of Systems   Review of Systems: Negative Unless Checked Constitutional: [] Weight loss  [] Fever  [] Chills Cardiac: [] Chest pain   []  Atrial Fibrillation  [] Palpitations   [] Shortness of breath when laying flat   [] Shortness of breath with exertion. [] Shortness of breath at rest Vascular:  [] Pain in legs with walking   [] Pain in legs with standing [] Pain in legs when laying flat   [] Claudication    [] Pain in feet when laying flat    [] History of DVT   [] Phlebitis   [] Swelling in legs   [] Varicose veins   [] Non-healing ulcers Pulmonary:   [] Uses home oxygen   [] Productive cough   [] Hemoptysis   [] Wheeze  [] COPD   [] Asthma Neurologic:  [] Dizziness   [] Seizures  [] Blackouts [] History of stroke    [] History of TIA  [] Aphasia   [] Temporary Blindness   [] Weakness or numbness in arm   [x] Weakness or numbness in leg Musculoskeletal:   [] Joint swelling   [] Joint pain   [] Low back pain  []  History of Knee Replacement [] Arthritis [] back Surgeries  []  Spinal Stenosis    Hematologic:  [] Easy bruising  [] Easy bleeding   [] Hypercoagulable state   [x] Anemic Gastrointestinal:  [] Diarrhea   [] Vomiting  [x] Gastroesophageal reflux/heartburn   [] Difficulty swallowing. [] Abdominal pain Genitourinary:  [] Chronic kidney disease   [] Difficult urination  [] Anuric   [] Blood in urine [] Frequent urination  [] Burning with urination   [] Hematuria Skin:  [] Rashes   [] Ulcers [] Wounds Psychological:  [] History of anxiety   []  History of major depression  []  Memory  Difficulties     Objective:   Physical Exam  BP (!) 155/59 (BP Location: Right Arm, Patient Position: Sitting)   Pulse 67   Resp 16   Ht 5\' 10"  (1.778 m)   Wt 171 lb 9.6 oz (77.8 kg)   BMI 24.62 kg/m   Gen: WD/WN, NAD Head: [place]/AT, No temporalis wasting.  Ear/Nose/Throat: Hearing grossly intact, nares w/o erythema or drainage Eyes: PER, EOMI, sclera nonicteric.  Neck: Supple, no masses.  No JVD.  Pulmonary:  Good air movement, no use of accessory muscles.  Cardiac: RRR Vascular: strong palpable thrill at distal end of fistula, at mid portion the thrill is not palpable, strong bruit at distal end, becomes faint when moving proximally  Vessel Right Left  Radial Palpable Palpable   Gastrointestinal: soft, non-distended. No guarding/no peritoneal signs.  Musculoskeletal: Uses cane for ambulation.   No deformity or atrophy.  Neurologic: Pain and light touch intact in extremities.  Symmetrical.  Speech is fluent. Motor exam as listed above. Psychiatric: Judgment intact, Mood & affect appropriate for pt's clinical situation. Dermatologic: No Venous rashes. No Ulcers Noted.  No changes consistent with cellulitis. Lymph : No Cervical lymphadenopathy, no  lichenification or skin changes of chronic lymphedema.      Assessment & Plan:   1. Complication of arteriovenous dialysis fistula, sequela Recommend:  The patient is experiencing increasing problems with their dialysis access.  Patient should have a fistulagram of the left radiocephalic AV Fistula with the intention for intervention.  The intention for intervention is to restore appropriate flow and prevent thrombosis and possible loss of the access.  As well as improve the quality of dialysis therapy.  The risks, benefits and alternative therapies were reviewed in detail with the patient.  All questions were answered.  The patient agrees to proceed with angio/intervention.      2. Essential hypertension Continue antihypertensive medications as already ordered, these medications have been reviewed and there are no changes at this time.   3. Uncontrolled type 2 diabetes mellitus with hyperglycemia ([place]) Continue hypoglycemic medications as already ordered, these medications have been reviewed and there are no changes at this time.  Hgb A1C to be monitored as already arranged by primary service    Current Outpatient Medications on File Prior to Visit  Medication Sig Dispense Refill  . acetaminophen (TYLENOL) 325 MG tablet Take 2 tablets (650 mg total) by mouth every 6 (six) hours as needed for mild pain (or Fever >/= 101).    Marland Kitchen allopurinol (ZYLOPRIM) 100 MG tablet Take 100 mg by mouth daily.    Marland Kitchen aspirin 81 MG tablet Take 81 mg by mouth daily.    Marland Kitchen atorvastatin (LIPITOR) 20 MG tablet Take 20 mg by mouth daily.    . ferrous sulfate 325 (65 FE) MG tablet Take 325 mg by mouth 2 (two) times daily with a meal.   11  . glimepiride (AMARYL) 2 MG tablet Take 2 mg by mouth 2 (two) times daily.     . hydrALAZINE (APRESOLINE) 50 MG tablet Take 50 mg by mouth 3 (three) times daily.    Marland Kitchen HYDROcodone-acetaminophen (NORCO) 5-325 MG tablet Take 1 tablet by mouth every 6 (six) hours as needed for  moderate pain or severe pain. 25 tablet 0  . lidocaine-prilocaine (EMLA) cream Apply 1 application topically daily as needed.    . metoprolol succinate (TOPROL-XL) 25 MG 24 hr tablet Take 25 mg by mouth daily.    . multivitamin (RENA-VIT) TABS tablet Take  1 tablet by mouth at bedtime.  0  . oxyCODONE-acetaminophen (PERCOCET/ROXICET) 5-325 MG tablet Take 1 tablet by mouth every 6 (six) hours as needed for moderate pain or severe pain.    . pantoprazole (PROTONIX) 40 MG tablet Take 1 tablet (40 mg total) by mouth daily. 30 tablet 6  . Polyvinyl Alcohol-Povidone (REFRESH OP) Place 1 drop into both eyes 2 (two) times daily.    . sodium bicarbonate 650 MG tablet Take 650 mg by mouth 2 (two) times daily.    . tamsulosin (FLOMAX) 0.4 MG CAPS capsule Take 1 capsule (0.4 mg total) by mouth daily after supper. 30 capsule 0  . Vitamin D, Cholecalciferol, 1000 UNITS TABS Take 1 tablet by mouth daily.      No current facility-administered medications on file prior to visit.     There are no Patient Instructions on file for this visit. No follow-ups on file.   Kris Hartmann, NP  This note was completed with Sales executive.  Any errors are purely unintentional.

## 2019-01-11 ENCOUNTER — Other Ambulatory Visit (INDEPENDENT_AMBULATORY_CARE_PROVIDER_SITE_OTHER): Payer: Self-pay | Admitting: Nurse Practitioner

## 2019-01-11 ENCOUNTER — Ambulatory Visit
Admission: RE | Admit: 2019-01-11 | Discharge: 2019-01-11 | Disposition: A | Discharge status: Home / Self Care | Payer: Medicare Other | Source: Ambulatory Visit | Attending: Vascular Surgery | Admitting: Vascular Surgery

## 2019-01-11 ENCOUNTER — Other Ambulatory Visit: Payer: Self-pay

## 2019-01-11 ENCOUNTER — Encounter
Admission: RE | Disposition: A | Discharge status: Home / Self Care | Payer: Self-pay | Source: Ambulatory Visit | Attending: Vascular Surgery

## 2019-01-11 ENCOUNTER — Encounter: Payer: Self-pay | Admitting: Vascular Surgery

## 2019-01-11 DIAGNOSIS — E1122 Type 2 diabetes mellitus with diabetic chronic kidney disease: Secondary | ICD-10-CM | POA: Insufficient documentation

## 2019-01-11 DIAGNOSIS — Z79899 Other long term (current) drug therapy: Secondary | ICD-10-CM | POA: Diagnosis not present

## 2019-01-11 DIAGNOSIS — Z7984 Long term (current) use of oral hypoglycemic drugs: Secondary | ICD-10-CM | POA: Insufficient documentation

## 2019-01-11 DIAGNOSIS — Y832 Surgical operation with anastomosis, bypass or graft as the cause of abnormal reaction of the patient, or of later complication, without mention of misadventure at the time of the procedure: Secondary | ICD-10-CM | POA: Insufficient documentation

## 2019-01-11 DIAGNOSIS — I120 Hypertensive chronic kidney disease with stage 5 chronic kidney disease or end stage renal disease: Secondary | ICD-10-CM | POA: Insufficient documentation

## 2019-01-11 DIAGNOSIS — Z992 Dependence on renal dialysis: Secondary | ICD-10-CM | POA: Diagnosis not present

## 2019-01-11 DIAGNOSIS — T82510A Breakdown (mechanical) of surgically created arteriovenous fistula, initial encounter: Secondary | ICD-10-CM | POA: Insufficient documentation

## 2019-01-11 DIAGNOSIS — T82858A Stenosis of vascular prosthetic devices, implants and grafts, initial encounter: Secondary | ICD-10-CM | POA: Diagnosis not present

## 2019-01-11 DIAGNOSIS — N186 End stage renal disease: Secondary | ICD-10-CM | POA: Diagnosis not present

## 2019-01-11 HISTORY — PX: A/V FISTULAGRAM: CATH118298

## 2019-01-11 LAB — GLUCOSE, CAPILLARY
GLUCOSE-CAPILLARY: 91 mg/dL (ref 70–99)
Glucose-Capillary: 96 mg/dL (ref 70–99)

## 2019-01-11 LAB — POTASSIUM (ARMC VASCULAR LAB ONLY): POTASSIUM (ARMC VASCULAR LAB): 4.1 (ref 3.5–5.1)

## 2019-01-11 SURGERY — A/V FISTULAGRAM
Anesthesia: Moderate Sedation | Laterality: Left

## 2019-01-11 MED ORDER — MIDAZOLAM HCL 2 MG/2ML IJ SOLN
INTRAMUSCULAR | Status: DC | PRN
  Administered 2019-01-11: 2 mg via INTRAVENOUS
  Administered 2019-01-11: 1 mg via INTRAVENOUS
  Administered 2019-01-11 (×2): 0.5 mg via INTRAVENOUS

## 2019-01-11 MED ORDER — DIPHENHYDRAMINE HCL 50 MG/ML IJ SOLN: 50.0000 mg | Freq: Once | INTRAMUSCULAR | Status: DC | PRN

## 2019-01-11 MED ORDER — SODIUM CHLORIDE 0.9 % IV SOLN: INTRAVENOUS | Status: DC

## 2019-01-11 MED ORDER — FENTANYL CITRATE (PF) 100 MCG/2ML IJ SOLN
INTRAMUSCULAR | Status: DC | PRN
  Administered 2019-01-11 (×2): 25 ug via INTRAVENOUS
  Administered 2019-01-11: 50 ug via INTRAVENOUS

## 2019-01-11 MED ORDER — LIDOCAINE-EPINEPHRINE (PF) 1 %-1:200000 IJ SOLN
INTRAMUSCULAR | Status: AC
  Filled 2019-01-11: qty 30

## 2019-01-11 MED ORDER — FENTANYL CITRATE (PF) 100 MCG/2ML IJ SOLN
INTRAMUSCULAR | Status: AC
  Filled 2019-01-11: qty 2

## 2019-01-11 MED ORDER — IOPAMIDOL (ISOVUE-300) INJECTION 61%
INTRAVENOUS | Status: DC | PRN
  Administered 2019-01-11: 25 mL via INTRAVENOUS

## 2019-01-11 MED ORDER — MIDAZOLAM HCL 5 MG/5ML IJ SOLN
INTRAMUSCULAR | Status: AC
  Filled 2019-01-11: qty 5

## 2019-01-11 MED ORDER — HEPARIN SODIUM (PORCINE) 1000 UNIT/ML IJ SOLN
INTRAMUSCULAR | Status: DC | PRN
  Administered 2019-01-11: 3000 [IU] via INTRAVENOUS

## 2019-01-11 MED ORDER — ONDANSETRON HCL 4 MG/2ML IJ SOLN: 4.0000 mg | Freq: Four times a day (QID) | INTRAMUSCULAR | Status: DC | PRN

## 2019-01-11 MED ORDER — METHYLPREDNISOLONE SODIUM SUCC 125 MG IJ SOLR: 125.0000 mg | Freq: Once | INTRAMUSCULAR | Status: DC | PRN

## 2019-01-11 MED ORDER — FAMOTIDINE 20 MG PO TABS: 40.0000 mg | ORAL_TABLET | Freq: Once | ORAL | Status: DC | PRN

## 2019-01-11 MED ORDER — MIDAZOLAM HCL 2 MG/ML PO SYRP: 8.0000 mg | ORAL_SOLUTION | Freq: Once | ORAL | Status: DC | PRN

## 2019-01-11 MED ORDER — HEPARIN (PORCINE) IN NACL 1000-0.9 UT/500ML-% IV SOLN
INTRAVENOUS | Status: AC
  Filled 2019-01-11: qty 1000

## 2019-01-11 MED ORDER — HYDROMORPHONE HCL 1 MG/ML IJ SOLN: 1.0000 mg | Freq: Once | INTRAMUSCULAR | Status: DC | PRN

## 2019-01-11 MED ORDER — CEFAZOLIN SODIUM-DEXTROSE 1-4 GM/50ML-% IV SOLN
1.0000 g | Freq: Once | INTRAVENOUS | Status: AC
  Administered 2019-01-11: 1 g via INTRAVENOUS

## 2019-01-11 MED ORDER — HEPARIN SODIUM (PORCINE) 1000 UNIT/ML IJ SOLN
INTRAMUSCULAR | Status: AC
  Filled 2019-01-11: qty 1

## 2019-01-11 SURGICAL SUPPLY — 14 items
BALLN LUTONIX DCB 4X60X130 (BALLOONS) ×3
BALLOON LUTONIX DCB 4X60X130 (BALLOONS) ×1 IMPLANT
CANNULA 5F STIFF (CANNULA) ×3 IMPLANT
CATH BEACON 5 .035 40 KMP TP (CATHETERS) ×1 IMPLANT
CATH BEACON 5 .035 65 RIM TIP (CATHETERS) ×3 IMPLANT
CATH BEACON 5 .038 40 KMP TP (CATHETERS) ×2
DEVICE PRESTO INFLATION (MISCELLANEOUS) ×3 IMPLANT
DRAPE BRACHIAL (DRAPES) ×3 IMPLANT
GLIDEWIRE STIFF .35X180X3 HYDR (WIRE) ×3 IMPLANT
PACK ANGIOGRAPHY (CUSTOM PROCEDURE TRAY) ×3 IMPLANT
SHEATH BRITE TIP 6FRX5.5 (SHEATH) ×3 IMPLANT
SUT MNCRL AB 4-0 PS2 18 (SUTURE) ×3 IMPLANT
WIRE MAGIC TOR.035 180C (WIRE) ×3 IMPLANT
WIRE NITINOL .018 (WIRE) ×3 IMPLANT

## 2019-01-11 NOTE — Op Note (Signed)
VEIN AND VASCULAR SURGERY    OPERATIVE NOTE   PROCEDURE: 1.   Left radiocephalic arteriovenous fistula cannulation under ultrasound guidance 2.   left arm fistulagram including central venogram 3.   Percutaneous transluminal angioplasty of perianastomotic stenosis with 4 mm diameter by 8 cm length Lutonix drug-coated angioplasty balloon  PRE-OPERATIVE DIAGNOSIS: 1. ESRD 2. Poorly functional left radiocephalic AVF  POST-OPERATIVE DIAGNOSIS: same as above   SURGEON: Leotis Pain, MD  ANESTHESIA: local with MCS  ESTIMATED BLOOD LOSS: 3 cc  FINDING(S): 1. Perianastomotic stenosis in the 65 to 70% range.  Tracking into the radial artery proximal to the anastomosis proved to be difficult so evaluation of the artery proximal to the anastomosis was very limited.  The remainder of the cephalic vein in the forearm was patent.  The outflow in the upper arm was the basilic vein which was widely patent.  The central venous circulation was widely patent.  SPECIMEN(S):  None  CONTRAST: 25 cc  FLUORO TIME: 6.7 minutes  MODERATE CONSCIOUS SEDATION TIME: Approximately 20 minutes with 4 mg of Versed and 100 mcg of Fentanyl   INDICATIONS: Martin Gilbert is a 78 y.o. male who presents with malfunctioning left radiocephalic arteriovenous fistula.  The patient is scheduled for left arm fistulagram.  The patient is aware the risks include but are not limited to: bleeding, infection, thrombosis of the cannulated access, and possible anaphylactic reaction to the contrast.  The patient is aware of the risks of the procedure and elects to proceed forward.  DESCRIPTION: After full informed written consent was obtained, the patient was brought back to the angiography suite and placed supine upon the angiography table.  The patient was connected to monitoring equipment. Moderate conscious sedation was administered with a face to face encounter with the patient throughout the procedure with my  supervision of the RN administering medicines and monitoring the patient's vital signs and mental status throughout from the start of the procedure until the patient was taken to the recovery room. The left arm was prepped and draped in the standard fashion for a percutaneous access intervention.  Under ultrasound guidance, the left radiocephalic arteriovenous fistula was cannulated with a micropuncture needle under direct ultrasound guidance where it was patent and a permanent image was performed.  The microwire was advanced into the fistula and the needle was exchanged for the a microsheath.  I then upsized to a 6 Fr Sheath and then put a Kumpe catheter to the anastomosis with the help of a Glidewire and imaging was performed.  Attempted to go into the more proximal radial artery and even exchanged for a rim catheter for more curve, but could never get into the more proximal radial artery.  Whether this was due to a steep angle or stenosis it was not clear, but evaluation of the radial artery proximal to the anastomosis was limited.  Hand injections were completed to image the access including the central venous system. This demonstrated Perianastomotic stenosis in the 65 to 70% range.  Tracking into the radial artery proximal to the anastomosis proved to be difficult so evaluation of the artery proximal to the anastomosis was very limited.  The remainder of the cephalic vein in the forearm was patent.  The outflow in the upper arm was the basilic vein which was widely patent.  The central venous circulation was widely patent.  Based on the images, this patient will need intervention to the perianastomotic stenosis. I then gave the patient 3000 units of intravenous  heparin.  I then crossed the stenosis with a Magic Tourqe wire.  Based on the imaging, a 4 mm x 8 cm Lutonix drug-coated angioplasty balloon was selected.  The balloon was centered around the perianastomotic stenosis and inflated to 12 ATM for 1  minute(s).  On completion imaging, a 30-40 % residual stenosis was present.     Based on the completion imaging, no further intervention is necessary.  The wire and balloon were removed from the sheath.  A 4-0 Monocryl purse-string suture was sewn around the sheath.  The sheath was removed while tying down the suture.  A sterile bandage was applied to the puncture site.  COMPLICATIONS: None  CONDITION: Stable   Leotis Pain  01/11/2019 11:50 AM   This note was created with Dragon Medical transcription system. Any errors in dictation are purely unintentional.

## 2019-01-11 NOTE — Progress Notes (Signed)
Maritza Afanador , interpretor, here to interpret for pt. And his son. Consent and history obtained. Both verbalize understanding.

## 2019-01-11 NOTE — H&P (Signed)
[facility] History & Physical Update  The patient was interviewed and re-examined.  The patient's previous History and Physical has been reviewed and is unchanged.  There is no change in the plan of care. We plan to proceed with the scheduled procedure.  Leotis Pain, MD  01/11/2019, 11:06 AM

## 2019-01-11 NOTE — Discharge Instructions (Signed)
Fistulografía de diálisis, cuidados posteriores  Dialysis Fistulogram, Care After  Esta hoja le proporciona información sobre cómo cuidarse después del procedimiento. Su médico también podrá darle instrucciones más específicas. Comuníquese con su médico si tiene problemas o preguntas.  ¿Qué puedo esperar después del procedimiento?  Después del procedimiento, es común tener los siguientes síntomas:  · Una pequeña molestia en la zona en la que se colocó el tubo delgado y pequeño (catéter) para el procedimiento.  · Un pequeño hematoma alrededor de la fístula.  · Somnolencia y cansancio (fatiga).  Siga estas indicaciones en su casa:  Actividad    · Haga reposo en su casa y no levante objetos que pesen más de 5 lb (2,3 kg) el día después del procedimiento.  · Reanude sus actividades normales según lo indicado por el médico. Pregúntele al médico qué actividades son seguras para usted.  · No conduzca ni use maquinaria pesada mientras toma analgésicos recetados.  · No conduzca durante 24 horas si recibió un medicamento para ayudarlo a relajarse (sedante) durante el procedimiento.  Medicamentos    · Tome los medicamentos de venta libre y los recetados solamente como se lo haya indicado el médico.  Cuidado del lugar de la punción  · Siga las indicaciones de su médico acerca de cómo cuidar el lugar donde se insertaron los catéteres. Asegúrese de hacer lo siguiente:  ? Lávese las manos con agua y jabón antes de cambiar las vendas (vendaje). Use desinfectante para manos si no dispone de agua y jabón.  ? Cambie el vendaje como se lo haya indicado el médico.  ? No retire los puntos (suturas), la goma para cerrar la piel o las tiras adhesivas. Es posible que estos cierres cutáneos deban permanecer en la piel durante 2 semanas o más. Si los bordes de las tiras adhesivas empiezan a despegarse y enroscarse, puede recortar los que estén sueltos. No retire las tiras adhesivas por completo a menos que el médico se lo indique.  · Controle  la zona de la punción todos los días para detectar signos de infección. Esté atento a los siguientes signos:  ? Dolor, hinchazón o enrojecimiento.  ? Líquido o sangre.  ? Calor.  ? Pus o mal olor.  Instrucciones generales  · No tome baños de inmersión, no nade ni use el jacuzzi hasta que el médico lo autorice. Pregúntele al médico si puede ducharse. Tal vez solo le permitan darse baños de esponja.  · Controle atentamente la fístula de diálisis. Verifique para asegurarse de que puede sentir una vibración o un zumbido (frémito) cuando coloca los dedos sobre la fístula.  · Evite dañar el injerto o la fístula:  ? No use ropa ajustada ni joyas en el brazo o la pierna que tiene el injerto o la fístula.  ? Informe a todos sus médicos que tiene un injerto o una fístula para diálisis.  ? No permita extracciones de sangre, terapias intravenosas ni lecturas de la presión arterial en el brazo que tiene la fístula o el injerto.  ? No permita que le apliquen vacunas antigripales ni otras vacunas en el brazo con la fístula o el injerto.  · Concurra a todas las visitas de seguimiento como se lo haya indicado el médico. Esto es importante.  Comuníquese con un médico si:  · Tiene enrojecimiento, hinchazón o dolor en el lugar donde se insertó el catéter.  · Observa líquido o sangre que proviene del lugar de la inserción del catéter.  · El lugar de la inserción del catéter   está caliente al tacto.  · Tiene pus o percibe mal olor que proviene del lugar de la inserción del catéter.  · Tiene fiebre o siente escalofríos.  Solicite ayuda de inmediato si:  · Se siente débil.  · Tiene problemas de equilibrio.  · Tiene dificultad para mover los brazos o las piernas.  · Tiene problemas visuales o para hablar.  · Ya no puede sentir una vibración o un zumbido cuando coloca los dedos sobre la fístula de diálisis.  · La extremidad que se usó para el procedimiento:  ? Se hincha.  ? Duele.  ? Está fría.  ? Cambia de color, por ejemplo, se torna  azulada o blanco pálido.  · Siente falta de aire o dolor en el pecho.  Resumen  · Después de una fistulografía de diálisis, es común tener una pequeña molestia o algunos moretones en la zona donde se colocó el tubo delgado y pequeño (catéter).  · Descanse en su casa el día después del procedimiento. Reanude sus actividades normales según lo indicado por el médico.  · Tome los medicamentos de venta libre y los recetados solamente como se lo haya indicado el médico.  · Siga las indicaciones de su médico acerca de cómo cuidar el lugar donde se insertó el catéter.  · Concurra a todas las visitas de seguimiento como se lo haya indicado el médico.  Esta información no tiene como fin reemplazar el consejo del médico. Asegúrese de hacerle al médico cualquier pregunta que tenga.  Document Released: 04/18/2014 Document Revised: 02/04/2018 Document Reviewed: 02/04/2018  Elsevier Interactive Patient Education © 2019 [employer]

## 2019-02-22 ENCOUNTER — Other Ambulatory Visit (INDEPENDENT_AMBULATORY_CARE_PROVIDER_SITE_OTHER): Payer: Self-pay | Admitting: Vascular Surgery

## 2019-02-22 ENCOUNTER — Ambulatory Visit (INDEPENDENT_AMBULATORY_CARE_PROVIDER_SITE_OTHER): Payer: Medicare Other | Admitting: Nurse Practitioner

## 2019-02-22 ENCOUNTER — Encounter (INDEPENDENT_AMBULATORY_CARE_PROVIDER_SITE_OTHER): Payer: Self-pay | Admitting: Nurse Practitioner

## 2019-02-22 ENCOUNTER — Ambulatory Visit (INDEPENDENT_AMBULATORY_CARE_PROVIDER_SITE_OTHER): Payer: Medicare Other

## 2019-02-22 VITALS — BP 164/65 | HR 58 | Resp 16 | Ht 70.0 in | Wt 173.4 lb

## 2019-02-22 DIAGNOSIS — E1165 Type 2 diabetes mellitus with hyperglycemia: Secondary | ICD-10-CM | POA: Diagnosis not present

## 2019-02-22 DIAGNOSIS — N186 End stage renal disease: Secondary | ICD-10-CM | POA: Diagnosis not present

## 2019-02-22 DIAGNOSIS — E1122 Type 2 diabetes mellitus with diabetic chronic kidney disease: Secondary | ICD-10-CM

## 2019-02-22 DIAGNOSIS — T829XXD Unspecified complication of cardiac and vascular prosthetic device, implant and graft, subsequent encounter: Secondary | ICD-10-CM | POA: Diagnosis not present

## 2019-02-22 DIAGNOSIS — I10 Essential (primary) hypertension: Secondary | ICD-10-CM

## 2019-02-22 DIAGNOSIS — Z992 Dependence on renal dialysis: Principal | ICD-10-CM

## 2019-02-22 DIAGNOSIS — I120 Hypertensive chronic kidney disease with stage 5 chronic kidney disease or end stage renal disease: Secondary | ICD-10-CM

## 2019-02-22 DIAGNOSIS — Z9889 Other specified postprocedural states: Secondary | ICD-10-CM

## 2019-02-22 DIAGNOSIS — Z7984 Long term (current) use of oral hypoglycemic drugs: Secondary | ICD-10-CM

## 2019-02-22 DIAGNOSIS — Z79899 Other long term (current) drug therapy: Secondary | ICD-10-CM

## 2019-02-22 NOTE — Progress Notes (Signed)
SUBJECTIVE:  Patient ID: Martin Gilbert, male    DOB: October 15, 1941, 78 y.o.   MRN: [id number] Chief Complaint  Patient presents with  . Follow-up    [facility] 6week HDA    HPI  Martin Gilbert is a 78 y.o. male The patient returns to the office for followup status post intervention of the dialysis access left radiocephalic AV fistula. Following the intervention the access function has significantly improved, with better flow rates and improved KT/V. The patient has not been experiencing increased bleeding times following decannulation and the patient denies increased recirculation. The patient denies an increase in arm swelling. At the present time the patient denies hand pain.  The patient denies amaurosis fugax or recent TIA symptoms. There are no recent neurological changes noted. The patient denies claudication symptoms or rest pain symptoms. The patient denies history of DVT, PE or superficial thrombophlebitis. The patient denies recent episodes of angina or shortness of breath.   The patient underwent hemodialysis access duplex which revealed flow volume of 842.  Elevated velocities at the antecubital fossa as well as distal forearm, however they are greatly reduced from the previous study done on 01/08/2019.  Previous flow volume was 381.        Past Medical History:  Diagnosis Date  . Anemia   . Back pain   . BPH (benign prostatic hyperplasia)   . CKD (chronic kidney disease), stage IV ([place])   . Clostridium difficile colitis   . Diabetes mellitus without complication ([place])   . GERD (gastroesophageal reflux disease)   . HTN (hypertension)   . Hyperlipidemia     Past Surgical History:  Procedure Laterality Date  . A/V FISTULAGRAM Left 04/20/2018   Procedure: A/V FISTULAGRAM;  Surgeon: Algernon Huxley, MD;  Location: [place] [facility];  Service: Cardiovascular;  Laterality: Left;  . A/V FISTULAGRAM Left 11/25/2018   Procedure: A/V FISTULAGRAM;  Surgeon: Algernon Huxley, MD;  Location: [place] [facility];  Service: Cardiovascular;  Laterality: Left;  . A/V FISTULAGRAM Left 01/11/2019   Procedure: A/V FISTULAGRAM;  Surgeon: Algernon Huxley, MD;  Location: [place] [facility];  Service: Cardiovascular;  Laterality: Left;  . AV FISTULA PLACEMENT Left 06/13/2017   Procedure: ARTERIOVENOUS (AV) FISTULA CREATION ( RADIAL CEPHALIC );  Surgeon: Katha Cabal, MD;  Location: [facility];  Service: Vascular;  Laterality: Left;  . CATARACT EXTRACTION     one eye not sure which  . DIALYSIS/PERMA CATHETER INSERTION N/A 04/03/2017   Procedure: Dialysis/Perma Catheter Insertion;  Surgeon: Algernon Huxley, MD;  Location: [place] [facility];  Service: Cardiovascular;  Laterality: N/A;  . DIALYSIS/PERMA CATHETER INSERTION N/A 08/06/2017   Procedure: DIALYSIS/PERMA CATHETER INSERTION;  Surgeon: Algernon Huxley, MD;  Location: [place] [facility];  Service: Cardiovascular;  Laterality: N/A;  . DIALYSIS/PERMA CATHETER REMOVAL N/A 08/04/2017   Procedure: DIALYSIS/PERMA CATHETER REMOVAL;  Surgeon: Algernon Huxley, MD;  Location: [place] [facility];  Service: Cardiovascular;  Laterality: N/A;  . DIALYSIS/PERMA CATHETER REMOVAL N/A 12/31/2017   Procedure: DIALYSIS/PERMA CATHETER REMOVAL;  Surgeon: Algernon Huxley, MD;  Location: [place] [facility];  Service: Cardiovascular;  Laterality: N/A;  . hernia repair    . SACROPLASTY N/A 09/30/2018   Procedure: SACROPLASTY;  Surgeon: Hessie Knows, MD;  Location: [facility];  Service: Orthopedics;  Laterality: N/A;  . UPPER EXTREMITY ANGIOGRAPHY Left 09/05/2017   Procedure: Upper Extremity Angiography;  Surgeon: Katha Cabal, MD;  Location: [place] [facility];  Service: Cardiovascular;  Laterality: Left;    Social History   Socioeconomic History  . Marital status: Married    Spouse name: Not on file  . Number of children: Not on file  . Years of education: Not on file  . Highest education level: Not on file  Occupational History  .  Occupation: retired  Scientific laboratory technician  . Financial resource strain: Not on file  . Food insecurity:    Worry: Not on file    Inability: Not on file  . Transportation needs:    Medical: Not on file    Non-medical: Not on file  Tobacco Use  . Smoking status: Never Smoker  . Smokeless tobacco: Never Used  Substance and Sexual Activity  . Alcohol use: No    Comment: Drank in the past, but has stopped for several years.   . Drug use: No  . Sexual activity: Not on file  Lifestyle  . Physical activity:    Days per week: Not on file    Minutes per session: Not on file  . Stress: Not on file  Relationships  . Social connections:    Talks on phone: Not on file    Gets together: Not on file    Attends religious service: Not on file    Active member of club or organization: Not on file    Attends meetings of clubs or organizations: Not on file    Relationship status: Not on file  . Intimate partner violence:    Fear of current or ex partner: Not on file    Emotionally abused: Not on file    Physically abused: Not on file    Forced sexual activity: Not on file  Other Topics Concern  . Not on file  Social History Narrative  . Not on file    Family History  Problem Relation Age of Onset  . Hypertension Mother   . Diabetes Neg Hx     No Known Allergies   Review of Systems   Review of Systems: Negative Unless Checked Constitutional: [] Weight loss  [] Fever  [] Chills Cardiac: [] Chest pain   []  Atrial Fibrillation  [] Palpitations   [] Shortness of breath when laying flat   [] Shortness of breath with exertion. [] Shortness of breath at rest Vascular:  [] Pain in legs with walking   [] Pain in legs with standing [] Pain in legs when laying flat   [] Claudication    [] Pain in feet when laying flat    [] History of DVT   [] Phlebitis   [] Swelling in legs   [] Varicose veins   [] Non-healing ulcers Pulmonary:   [] Uses home oxygen   [] Productive cough   [] Hemoptysis   [] Wheeze  [] COPD    [] Asthma Neurologic:  [] Dizziness   [] Seizures  [] Blackouts [] History of stroke   [] History of TIA  [] Aphasia   [] Temporary Blindness   [] Weakness or numbness in arm   [] Weakness or numbness in leg Musculoskeletal:   [] Joint swelling   [] Joint pain   [] Low back pain  []  History of Knee Replacement [] Arthritis [] back Surgeries  []  Spinal Stenosis    Hematologic:  [] Easy bruising  [] Easy bleeding   [] Hypercoagulable state   [x] Anemic Gastrointestinal:  [] Diarrhea   [] Vomiting  [] Gastroesophageal reflux/heartburn   [] Difficulty swallowing. [] Abdominal pain Genitourinary:  [x] Chronic kidney disease   [] Difficult urination  [] Anuric   [] Blood in urine [] Frequent urination  [] Burning with urination   [] Hematuria Skin:  [] Rashes   [] Ulcers [] Wounds Psychological:  [] History of anxiety   []   History of major depression  []  Memory Difficulties      OBJECTIVE:   Physical Exam  BP (!) 164/65 (BP Location: Right Arm)   Pulse (!) 58   Resp 16   Ht 5\' 10"  (1.778 m)   Wt 173 lb 7.2 oz (78.7 kg)   BMI 24.89 kg/m   Gen: WD/WN, NAD Head: [place]/AT, No temporalis wasting.  Ear/Nose/Throat: Hearing grossly intact, nares w/o erythema or drainage Eyes: PER, EOMI, sclera nonicteric.  Neck: Supple, no masses.  No JVD.  Pulmonary:  Good air movement, no use of accessory muscles.  Cardiac: RRR Vascular:  Good thrill and bruit Vessel Right Left  Radial Palpable Palpable   Gastrointestinal: soft, non-distended. No guarding/no peritoneal signs.  Musculoskeletal: M/S 5/5 throughout.  No deformity or atrophy.  Neurologic: Pain and light touch intact in extremities.  Symmetrical.  Speech is fluent. Motor exam as listed above. Psychiatric: Judgment intact, Mood & affect appropriate for pt's clinical situation. Dermatologic: No Venous rashes. No Ulcers Noted.  No changes consistent with cellulitis. Lymph : No Cervical lymphadenopathy, no lichenification or skin changes of chronic lymphedema.       ASSESSMENT AND  PLAN:  1. ESRD on dialysis [facility]) The patient returns to the office for followup status post intervention of the dialysis access left radiocephalic AV fistula. Following the intervention the access function has significantly improved, with better flow rates and improved KT/V. The patient has not been experiencing increased bleeding times following decannulation and the patient denies increased recirculation. The patient denies an increase in arm swelling. At the present time the patient denies hand pain.  The patient denies amaurosis fugax or recent TIA symptoms. There are no recent neurological changes noted. The patient denies claudication symptoms or rest pain symptoms. The patient denies history of DVT, PE or superficial thrombophlebitis. The patient denies recent episodes of angina or shortness of breath.    The patient will follow-up in 6 months   - VAS US DUPLEX DIALYSIS ACCESS (AVF, AVG); Future  2. Uncontrolled type 2 diabetes mellitus with hyperglycemia ([place]) Continue hypoglycemic medications as already ordered, these medications have been reviewed and there are no changes at this time.  Hgb A1C to be monitored as already arranged by primary service   3. Essential hypertension Continue antihypertensive medications as already ordered, these medications have been reviewed and there are no changes at this time.    Current Outpatient Medications on File Prior to Visit  Medication Sig Dispense Refill  . acetaminophen (TYLENOL) 325 MG tablet Take 2 tablets (650 mg total) by mouth every 6 (six) hours as needed for mild pain (or Fever >/= 101).    Marland Kitchen allopurinol (ZYLOPRIM) 100 MG tablet Take 100 mg by mouth daily.    Marland Kitchen aspirin 81 MG tablet Take 81 mg by mouth daily.    Marland Kitchen atorvastatin (LIPITOR) 20 MG tablet Take 20 mg by mouth daily.    . ferrous sulfate 325 (65 FE) MG tablet Take 325 mg by mouth 2 (two) times daily with a meal.   11  . glimepiride (AMARYL) 2 MG tablet Take 2 mg by  mouth 2 (two) times daily.     . hydrALAZINE (APRESOLINE) 50 MG tablet Take 50 mg by mouth 3 (three) times daily.    Marland Kitchen lidocaine-prilocaine (EMLA) cream Apply 1 application topically daily as needed.    . metoprolol succinate (TOPROL-XL) 25 MG 24 hr tablet Take 25 mg by mouth daily.    . multivitamin (RENA-VIT) TABS tablet  Take 1 tablet by mouth at bedtime.  0  . oxyCODONE-acetaminophen (PERCOCET/ROXICET) 5-325 MG tablet Take 1 tablet by mouth every 6 (six) hours as needed for moderate pain or severe pain.    . pantoprazole (PROTONIX) 40 MG tablet Take 1 tablet (40 mg total) by mouth daily. 30 tablet 6  . Polyvinyl Alcohol-Povidone (REFRESH OP) Place 1 drop into both eyes 2 (two) times daily.    . sodium bicarbonate 650 MG tablet Take 650 mg by mouth 2 (two) times daily.    . tamsulosin (FLOMAX) 0.4 MG CAPS capsule Take 1 capsule (0.4 mg total) by mouth daily after supper. 30 capsule 0  . Vitamin D, Cholecalciferol, 1000 UNITS TABS Take 1 tablet by mouth daily.     Marland Kitchen HYDROcodone-acetaminophen (NORCO) 5-325 MG tablet Take 1 tablet by mouth every 6 (six) hours as needed for moderate pain or severe pain. (Patient not taking: Reported on 01/11/2019) 25 tablet 0   No current facility-administered medications on file prior to visit.     There are no Patient Instructions on file for this visit. No follow-ups on file.   Kris Hartmann, NP  This note was completed with Sales executive.  Any errors are purely unintentional.

## 2019-04-22 ENCOUNTER — Telehealth: Payer: Self-pay

## 2019-04-22 NOTE — Telephone Encounter (Signed)
Called patient from recall list.  No answer. LMOV.  This is the second attempt per recall list.   

## 2019-08-30 ENCOUNTER — Encounter (INDEPENDENT_AMBULATORY_CARE_PROVIDER_SITE_OTHER): Payer: Medicare Other

## 2019-08-30 ENCOUNTER — Ambulatory Visit (INDEPENDENT_AMBULATORY_CARE_PROVIDER_SITE_OTHER): Payer: Medicare Other | Admitting: Nurse Practitioner

## 2019-08-31 ENCOUNTER — Other Ambulatory Visit: Payer: Self-pay

## 2019-08-31 ENCOUNTER — Ambulatory Visit (INDEPENDENT_AMBULATORY_CARE_PROVIDER_SITE_OTHER): Payer: Medicare Other

## 2019-08-31 ENCOUNTER — Encounter (INDEPENDENT_AMBULATORY_CARE_PROVIDER_SITE_OTHER): Payer: Self-pay

## 2019-08-31 ENCOUNTER — Encounter (INDEPENDENT_AMBULATORY_CARE_PROVIDER_SITE_OTHER): Payer: Self-pay | Admitting: Nurse Practitioner

## 2019-08-31 ENCOUNTER — Ambulatory Visit (INDEPENDENT_AMBULATORY_CARE_PROVIDER_SITE_OTHER): Payer: Medicare Other | Admitting: Nurse Practitioner

## 2019-08-31 VITALS — BP 135/60 | HR 69 | Resp 16 | Wt 176.0 lb

## 2019-08-31 DIAGNOSIS — N186 End stage renal disease: Secondary | ICD-10-CM

## 2019-08-31 DIAGNOSIS — R110 Nausea: Secondary | ICD-10-CM | POA: Diagnosis not present

## 2019-08-31 DIAGNOSIS — M170 Bilateral primary osteoarthritis of knee: Secondary | ICD-10-CM | POA: Diagnosis not present

## 2019-08-31 DIAGNOSIS — Z992 Dependence on renal dialysis: Secondary | ICD-10-CM | POA: Diagnosis not present

## 2019-08-31 NOTE — Progress Notes (Signed)
SUBJECTIVE:  Patient ID: Martin Gilbert, male    DOB: 1941-10-07, 78 y.o.   MRN: [id number] Chief Complaint  Patient presents with  . Follow-up    ultrasound follow up    HPI  Martin Gilbert is a 78 y.o. male The patient returns to the office for followup of their dialysis access. The function of the access has been stable. The patient denies increased bleeding time or increased recirculation. Patient denies difficulty with cannulation. The patient endorses just a little hand pain.  No significant arm swelling.  The patient denies redness or swelling at the access site. The patient denies fever or chills at home or while on dialysis.  The patient denies amaurosis fugax or recent TIA symptoms. There are no recent neurological changes noted. The patient denies claudication symptoms or rest pain symptoms. The patient denies history of DVT, PE or superficial thrombophlebitis. The patient denies recent episodes of angina or shortness of breath.   The patient has a left radiocephalic AV fistula.  The flow volume is 1043, which is improved from the last study.  The radiocephalic AV fistula has no evidence of stenosis although there is an increase in velocities at the antecubital fossa from the distal upper arm.  It is noted that the patient has retrograde flow within the radial artery.     Past Medical History:  Diagnosis Date  . Anemia   . Back pain   . BPH (benign prostatic hyperplasia)   . CKD (chronic kidney disease), stage IV ([place])   . Clostridium difficile colitis   . Diabetes mellitus without complication ([place])   . GERD (gastroesophageal reflux disease)   . HTN (hypertension)   . Hyperlipidemia     Past Surgical History:  Procedure Laterality Date  . A/V FISTULAGRAM Left 04/20/2018   Procedure: A/V FISTULAGRAM;  Surgeon: Algernon Huxley, MD;  Location: [place] [facility];  Service: Cardiovascular;  Laterality: Left;  . A/V FISTULAGRAM Left 11/25/2018   Procedure: A/V FISTULAGRAM;  Surgeon: Algernon Huxley, MD;  Location: [place] [facility];  Service: Cardiovascular;  Laterality: Left;  . A/V FISTULAGRAM Left 01/11/2019   Procedure: A/V FISTULAGRAM;  Surgeon: Algernon Huxley, MD;  Location: [place] [facility];  Service: Cardiovascular;  Laterality: Left;  . AV FISTULA PLACEMENT Left 06/13/2017   Procedure: ARTERIOVENOUS (AV) FISTULA CREATION ( RADIAL CEPHALIC );  Surgeon: Katha Cabal, MD;  Location: [facility];  Service: Vascular;  Laterality: Left;  . CATARACT EXTRACTION     one eye not sure which  . DIALYSIS/PERMA CATHETER INSERTION N/A 04/03/2017   Procedure: Dialysis/Perma Catheter Insertion;  Surgeon: Algernon Huxley, MD;  Location: [place] [facility];  Service: Cardiovascular;  Laterality: N/A;  . DIALYSIS/PERMA CATHETER INSERTION N/A 08/06/2017   Procedure: DIALYSIS/PERMA CATHETER INSERTION;  Surgeon: Algernon Huxley, MD;  Location: [place] [facility];  Service: Cardiovascular;  Laterality: N/A;  . DIALYSIS/PERMA CATHETER REMOVAL N/A 08/04/2017   Procedure: DIALYSIS/PERMA CATHETER REMOVAL;  Surgeon: Algernon Huxley, MD;  Location: [place] [facility];  Service: Cardiovascular;  Laterality: N/A;  . DIALYSIS/PERMA CATHETER REMOVAL N/A 12/31/2017   Procedure: DIALYSIS/PERMA CATHETER REMOVAL;  Surgeon: Algernon Huxley, MD;  Location: [place] [facility];  Service: Cardiovascular;  Laterality: N/A;  . hernia repair    . SACROPLASTY N/A 09/30/2018   Procedure: SACROPLASTY;  Surgeon: Hessie Knows, MD;  Location: [facility];  Service: Orthopedics;  Laterality: N/A;  . UPPER EXTREMITY ANGIOGRAPHY Left 09/05/2017   Procedure:  Upper Extremity Angiography;  Surgeon: Katha Cabal, MD;  Location: [place] [facility];  Service: Cardiovascular;  Laterality: Left;    Social History   Socioeconomic History  . Marital status: Married    Spouse name: Not on file  . Number of children: Not on file  . Years of education: Not on file  . Highest education  level: Not on file  Occupational History  . Occupation: retired  Scientific laboratory technician  . Financial resource strain: Not on file  . Food insecurity    Worry: Not on file    Inability: Not on file  . Transportation needs    Medical: Not on file    Non-medical: Not on file  Tobacco Use  . Smoking status: Never Smoker  . Smokeless tobacco: Never Used  Substance and Sexual Activity  . Alcohol use: No    Comment: Drank in the past, but has stopped for several years.   . Drug use: No  . Sexual activity: Not on file  Lifestyle  . Physical activity    Days per week: Not on file    Minutes per session: Not on file  . Stress: Not on file  Relationships  . Social Herbalist on phone: Not on file    Gets together: Not on file    Attends religious service: Not on file    Active member of club or organization: Not on file    Attends meetings of clubs or organizations: Not on file    Relationship status: Not on file  . Intimate partner violence    Fear of current or ex partner: Not on file    Emotionally abused: Not on file    Physically abused: Not on file    Forced sexual activity: Not on file  Other Topics Concern  . Not on file  Social History Narrative  . Not on file    Family History  Problem Relation Age of Onset  . Hypertension Mother   . Diabetes Neg Hx     No Known Allergies   Review of Systems   Review of Systems: Negative Unless Checked Constitutional: [] Weight loss  [] Fever  [] Chills Cardiac: [] Chest pain   []  Atrial Fibrillation  [] Palpitations   [] Shortness of breath when laying flat   [] Shortness of breath with exertion. [] Shortness of breath at rest Vascular:  [] Pain in legs with walking   [] Pain in legs with standing [] Pain in legs when laying flat   [] Claudication    [] Pain in feet when laying flat    [] History of DVT   [] Phlebitis   [] Swelling in legs   [] Varicose veins   [] Non-healing ulcers Pulmonary:   [] Uses home oxygen   [] Productive cough    [] Hemoptysis   [] Wheeze  [] COPD   [] Asthma Neurologic:  [] Dizziness   [] Seizures  [] Blackouts [] History of stroke   [] History of TIA  [] Aphasia   [] Temporary Blindness   [] Weakness or numbness in arm   [] Weakness or numbness in leg Musculoskeletal:   [] Joint swelling   [] Joint pain   [] Low back pain  []  History of Knee Replacement [x] Arthritis [] back Surgeries  []  Spinal Stenosis    Hematologic:  [] Easy bruising  [] Easy bleeding   [] Hypercoagulable state   [x] Anemic Gastrointestinal:  [] Diarrhea   [] Vomiting  [x] Gastroesophageal reflux/heartburn   [] Difficulty swallowing. [] Abdominal pain Genitourinary:  [x] Chronic kidney disease   [] Difficult urination  [] Anuric   [] Blood in urine [] Frequent urination  [] Burning with urination   []   Hematuria Skin:  [] Rashes   [] Ulcers [] Wounds Psychological:  [] History of anxiety   []  History of major depression  []  Memory Difficulties      OBJECTIVE:   Physical Exam  BP 135/60   Pulse 69   Resp 16   Wt 176 lb (79.8 kg)   BMI 25.25 kg/m   Gen: WD/WN, NAD Head: [place]/AT, No temporalis wasting.  Ear/Nose/Throat: Hearing grossly intact, nares w/o erythema or drainage Eyes: PER, EOMI, sclera nonicteric.  Neck: Supple, no masses.  No JVD.  Pulmonary:  Good air movement, no use of accessory muscles.  Cardiac: RRR Vascular:  Good thrill and bruit Vessel Right Left  Radial Palpable Palpable   Gastrointestinal: soft, non-distended. No guarding/no peritoneal signs.  Musculoskeletal: Walks with a cane..  No deformity or atrophy.  Neurologic: Pain and light touch intact in extremities.  Symmetrical.  Speech is fluent. Motor exam as listed above. Psychiatric: Judgment intact, Mood & affect appropriate for pt's clinical situation. Dermatologic: No Venous rashes. No Ulcers Noted.  No changes consistent with cellulitis. Lymph : No Cervical lymphadenopathy, no lichenification or skin changes of chronic lymphedema.       ASSESSMENT AND PLAN:  1. ESRD on  dialysis [facility]) Recommend:  The patient is doing well and currently has adequate dialysis access. The patient's dialysis center is not reporting any access issues. Flow pattern is stable when compared to the prior ultrasound.  The patient should have a duplex ultrasound of the dialysis access in 6 months. The patient will follow-up with me in the office after each ultrasound   The patient has some evidence of steal however he states that he has only little pain at times in his hand.  The patient is advised to contact us sooner if the pain worsens, he experiences numbness or tingling in the hand, discoloration of the hand, or constant coldness.  As it could indicate a worsening.   2. Nausea I have advised the patient to speak with their dialysis center regarding this nausea.  As it could be something to do with the dialysate.  If they are not able to find a cause we can also perform a mesenteric duplex to ensure that there is no stenosis within the mesenteric arteries possibly causing nausea.  3. Primary osteoarthritis of both knees Continue NSAID medications as already ordered, these medications have been reviewed and there are no changes at this time.  Continued activity and therapy was stressed.    Current Outpatient Medications on File Prior to Visit  Medication Sig Dispense Refill  . acetaminophen (TYLENOL) 325 MG tablet Take 2 tablets (650 mg total) by mouth every 6 (six) hours as needed for mild pain (or Fever >/= 101).    Marland Kitchen allopurinol (ZYLOPRIM) 100 MG tablet Take 100 mg by mouth daily.    Marland Kitchen aspirin 81 MG tablet Take 81 mg by mouth daily.    Marland Kitchen atorvastatin (LIPITOR) 20 MG tablet Take 20 mg by mouth daily.    . ferrous sulfate 325 (65 FE) MG tablet Take 325 mg by mouth 2 (two) times daily with a meal.   11  . glimepiride (AMARYL) 2 MG tablet Take 2 mg by mouth 2 (two) times daily.     . hydrALAZINE (APRESOLINE) 50 MG tablet Take 50 mg by mouth 3 (three) times daily.    Marland Kitchen  lidocaine-prilocaine (EMLA) cream Apply 1 application topically daily as needed.    . metoprolol succinate (TOPROL-XL) 25 MG 24 hr tablet Take 25 mg  by mouth daily.    . multivitamin (RENA-VIT) TABS tablet Take 1 tablet by mouth at bedtime.  0  . oxyCODONE-acetaminophen (PERCOCET/ROXICET) 5-325 MG tablet Take 1 tablet by mouth every 6 (six) hours as needed for moderate pain or severe pain.    . pantoprazole (PROTONIX) 40 MG tablet Take 1 tablet (40 mg total) by mouth daily. 30 tablet 6  . Polyvinyl Alcohol-Povidone (REFRESH OP) Place 1 drop into both eyes 2 (two) times daily.    . sodium bicarbonate 650 MG tablet Take 650 mg by mouth 2 (two) times daily.    . tamsulosin (FLOMAX) 0.4 MG CAPS capsule Take 1 capsule (0.4 mg total) by mouth daily after supper. 30 capsule 0  . Vitamin D, Cholecalciferol, 1000 UNITS TABS Take 1 tablet by mouth daily.     Marland Kitchen HYDROcodone-acetaminophen (NORCO) 5-325 MG tablet Take 1 tablet by mouth every 6 (six) hours as needed for moderate pain or severe pain. (Patient not taking: Reported on 01/11/2019) 25 tablet 0   No current facility-administered medications on file prior to visit.     There are no Patient Instructions on file for this visit. No follow-ups on file.   Kris Hartmann, NP  This note was completed with Sales executive.  Any errors are purely unintentional.

## 2020-01-23 IMAGING — MR MR LUMBAR SPINE W/O CM
5 series · 30 of 48 positions shown · non-contrast
Comparison: Prior CT from earlier same day as well as previous MRI
from 02/01/2017.

CLINICAL DATA: Initial evaluation for acute back pain radiating
into the left lower extremity for 3 weeks, decreased mobility.

EXAM:
MRI LUMBAR SPINE WITHOUT CONTRAST
TECHNIQUE: Multiplanar, multisequence MR imaging of the lumbar spine was
performed. No intravenous contrast was administered.

[Series 5: T2 · sagittal · 4.0mm · 0.81mm/px · 6 of 17 slices shown (1 of 2)]
[im 1/17]
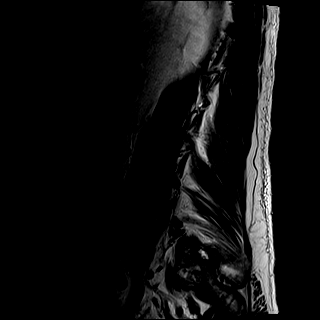
[im 4/17]
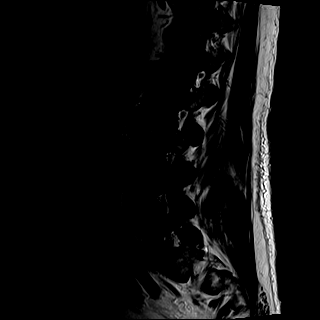
[im 7/17]
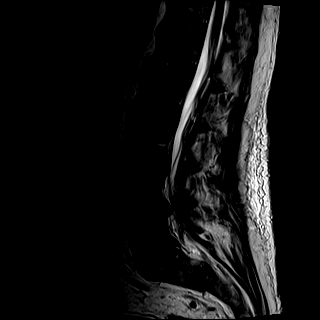
[im 10/17]
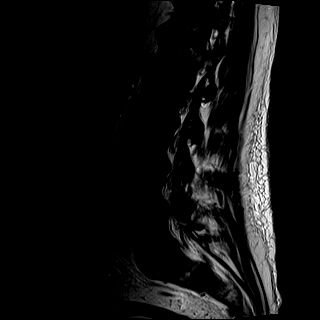
[im 13/17]
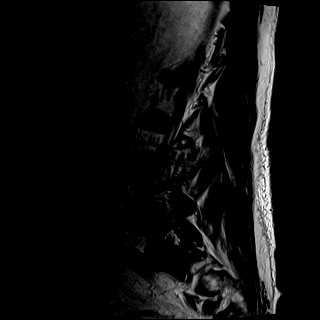
[im 17/17]
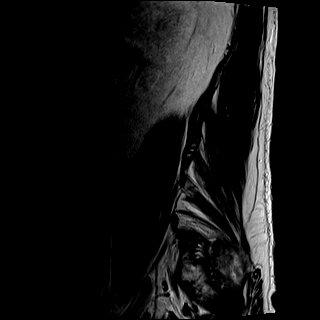

[Series 6: T1 · sagittal · 4.0mm · 0.81mm/px · 7 of 17 slices shown (1 of 2)]
[im 1/17]
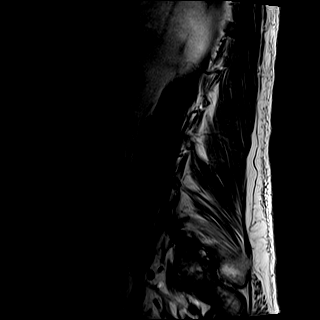
[im 3/17]
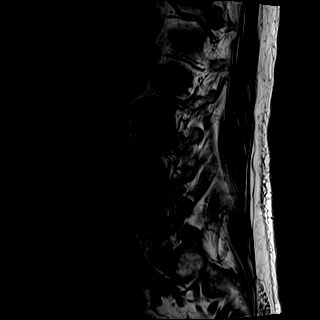
[im 6/17]
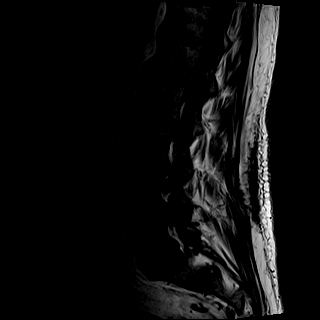
[im 9/17]
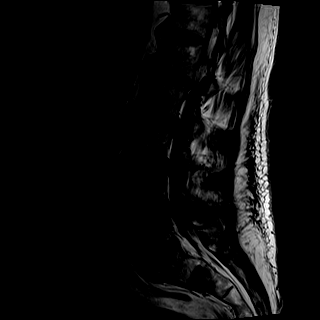
[im 11/17]
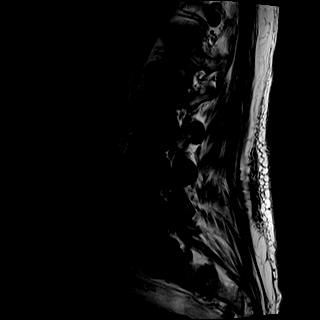
[im 14/17]
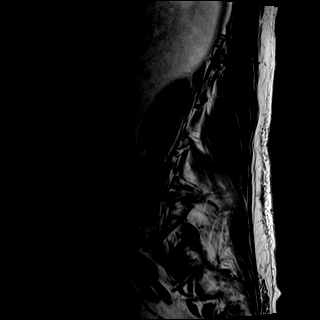
[im 17/17]
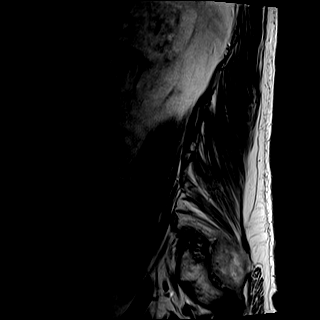

[Series 7: STIR · sagittal · 4.0mm · 0.41mm/px · 1 of 17 slices shown]
[im 1/17]
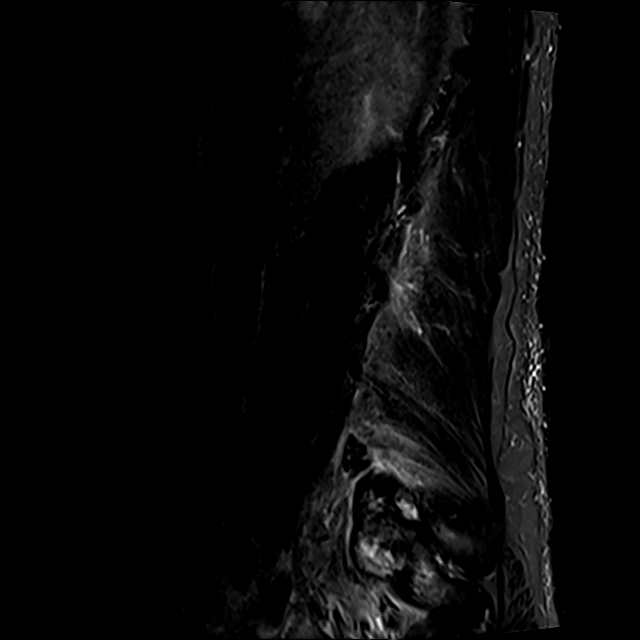

[Series 8: T2 · axial · 4.0mm · 0.78mm/px · z∈[-69,+137]mm · 8 of 33 slices shown (2 of 2)]
[im 1/33]
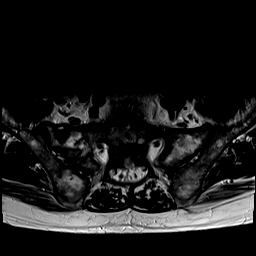
[im 5/33]
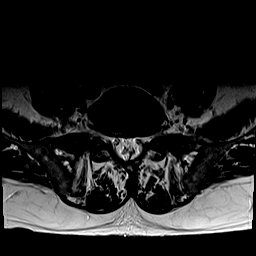
[im 10/33]
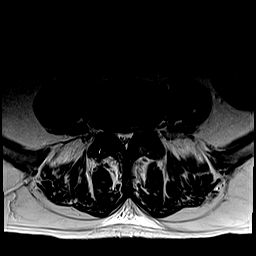
[im 15/33]
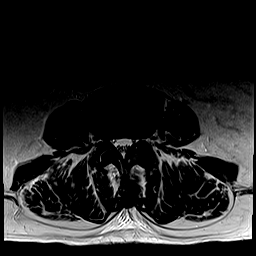
[im 18/33]
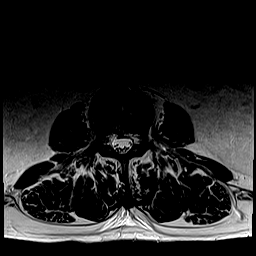
[im 23/33]
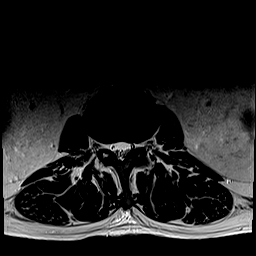
[im 28/33]
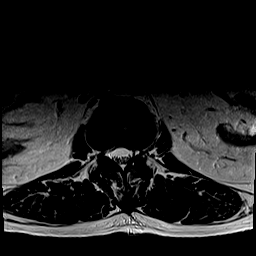
[im 33/33]
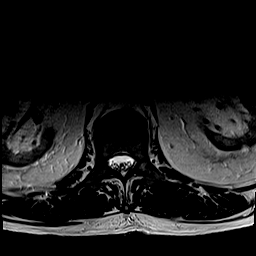

[Series 9: T1 · axial · 4.0mm · 0.39mm/px · z∈[-69,+137]mm · 8 of 33 slices shown (2 of 2)]
[im 1/33]
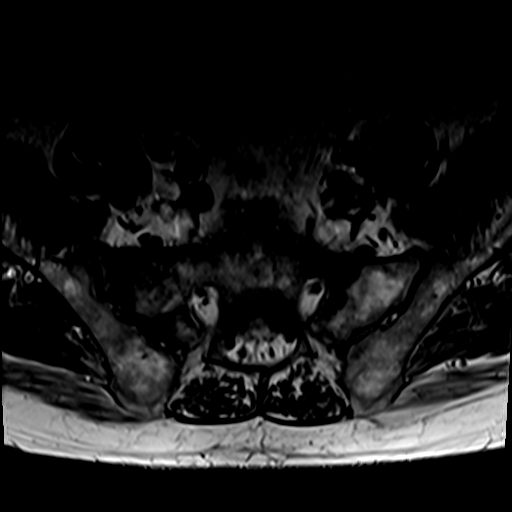
[im 5/33]
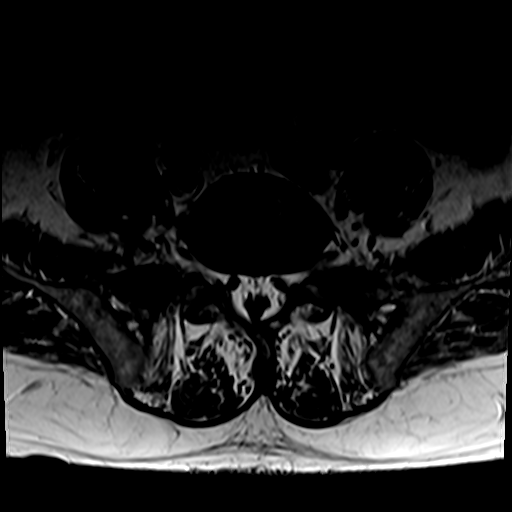
[im 10/33]
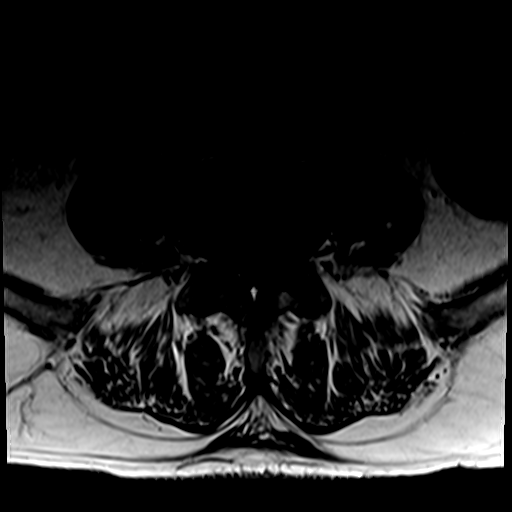
[im 15/33]
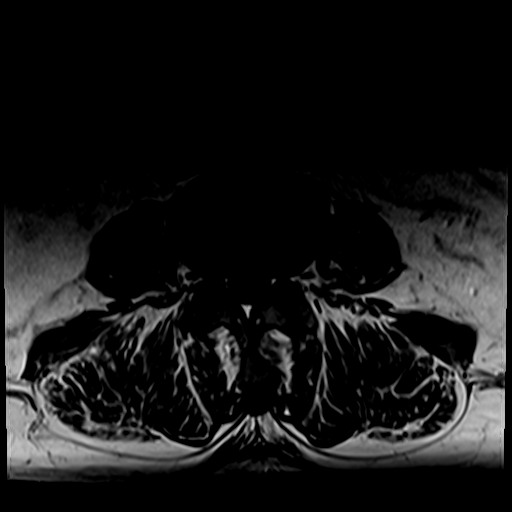
[im 18/33]
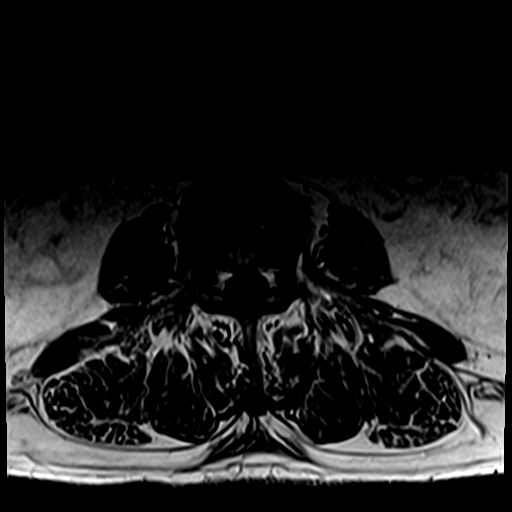
[im 23/33]
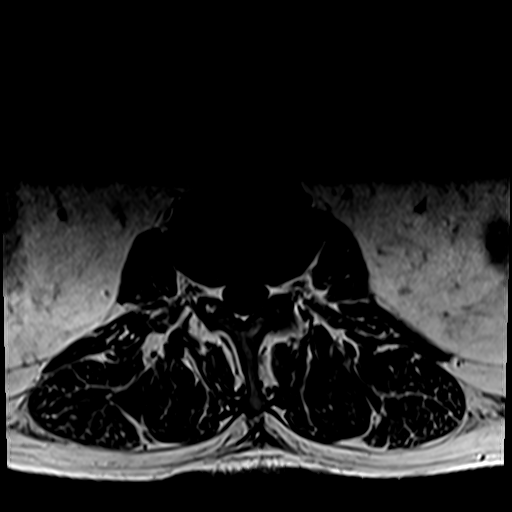
[im 28/33]
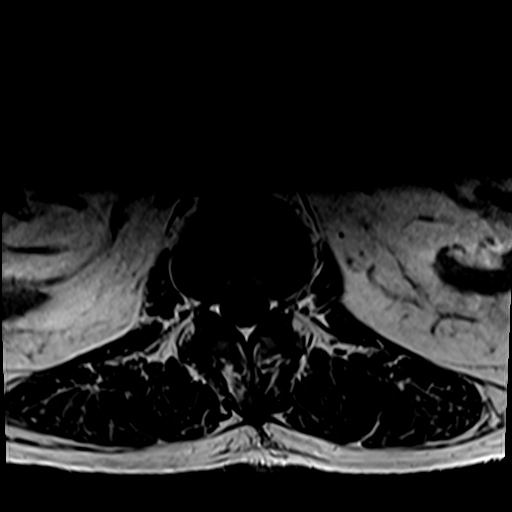
[im 33/33]
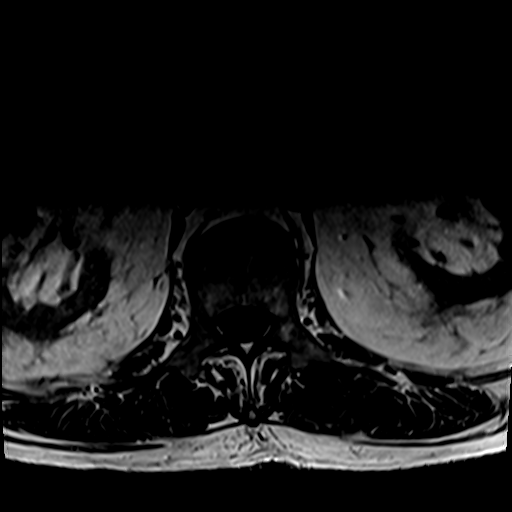

[30 of 48 positions shown; findings below may reference images not displayed]

FINDINGS: Segmentation: Normal segmentation. Lowest well-formed disc labeled
the L5-S1 level.

Alignment: Trace 2 mm retrolisthesis of L1 on L2. Mild
dextroscoliosis. Vertebral bodies otherwise normally aligned with
preservation of the normal lumbar lordosis.

Vertebrae: Abnormal marrow edema within the bilateral sacral ala,
consistent with acute sacral insufficiency type fractures, primarily
involving the S2 segment. Vertebral body heights maintained with no
other acute or chronic fracture identified. Underlying bone marrow
signal intensity mildly heterogeneous but within normal limits. No
worrisome osseous lesions. No other abnormal marrow edema.

Conus medullaris and cauda equina: Conus extends to the L1 level.
Conus and cauda equina appear normal.

Paraspinal and other soft tissues: Confluent edema within the lower
posterior paraspinous musculature as well as the presacral soft
tissues, likely reactive in nature due to sacral insufficiency type
fractures. A degree of associated muscular strain is suspected.
Paraspinous soft tissues otherwise within normal limits. Visualized
kidneys are atrophic, consistent with history of end-stage renal
disease. Single subcentimeter T2 hyperintense simple cyst noted
within the left kidney.

Disc levels:

T12-L1: Unremarkable.

L1-2: Trace retrolisthesis. Diffuse disc bulge with disc
desiccation. No significant canal or foraminal stenosis.

L2-3: Mild annular disc bulge. Small annular fissure at the level of
the left lateral recess/foramen. Mild bilateral facet and ligament
flavum hypertrophy. No significant canal or neural foraminal
stenosis. No impingement.

L3-4: Diffuse disc bulge with disc desiccation. Mild facet
hypertrophy. Resultant mild left lateral recess narrowing without
significant canal stenosis. Mild bilateral L3 foraminal narrowing,
right greater than left.

L4-5: Diffuse disc bulge with disc desiccation and mild
intervertebral disc space narrowing. Mild reactive endplate changes
with marginal endplate osteophytic spurring. Moderate facet and
ligament flavum hypertrophy. Left paracentral annular fissure. Mild
to moderate canal with left greater than right lateral recess
stenosis, relatively unchanged from previous. Mild bilateral L4
foraminal narrowing.

L5-S1: Mild disc bulge with disc desiccation. Right paracentral disc
protrusion. Mild epidural lipomatosis, partially effacing the distal
thecal sac. No other significant canal or lateral recess stenosis.
Foramina remain patent.
IMPRESSION: 1. Acute sacral insufficiency type fractures involving the bilateral
sacral ala, presumably the source of patient's acute lower back
pain.
2. Confluent edema within the lower posterior paraspinous
musculature, likely reflecting a degree of associated muscular
strain/injury.
3. Multilevel lumbar spondylolysis with mild to moderate
multifactorial spinal stenosis with left greater than right lateral
recess narrowing at L4-5, stable.

## 2020-01-23 IMAGING — CT CT ABD-PELV W/O CM
2 of 4 series · 16 of 46 positions shown, 18 images · non-contrast
Comparison: 03/29/2017

CLINICAL DATA: Back pain and weakness

EXAM:
CT ABDOMEN AND PELVIS WITHOUT CONTRAST
TECHNIQUE: Multidetector CT imaging of the abdomen and pelvis was performed
following the standard protocol without IV contrast.

[Series 2: axial st · axial · 0.85mm/px · z∈[-505,-30]mm · 13 of 105 slices shown, 15 images]
[im 5/105  soft-tissue]
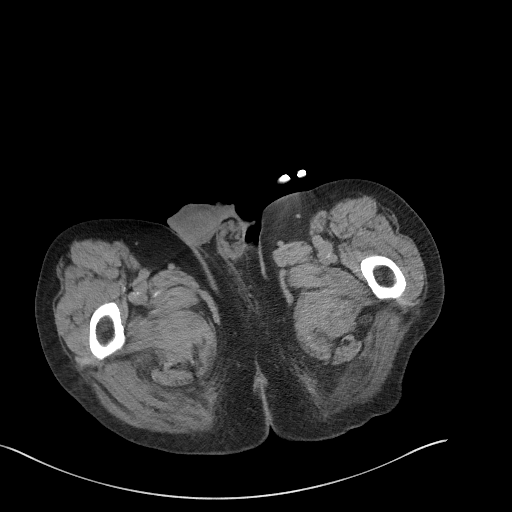
[im 5/105  bone]
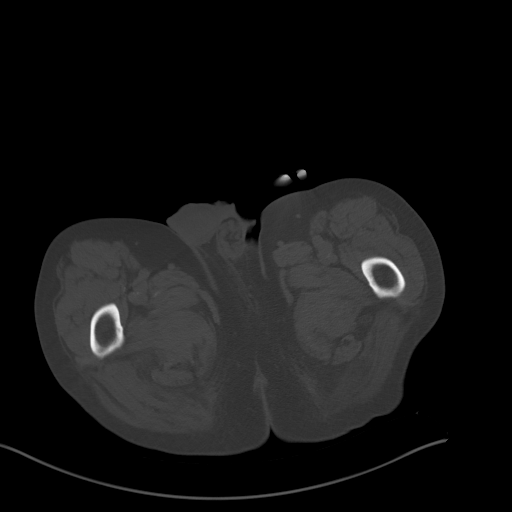
[im 14/105  soft-tissue]
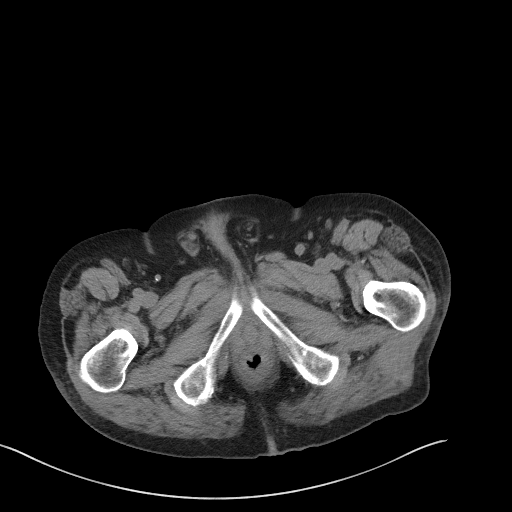
[im 22/105  soft-tissue]
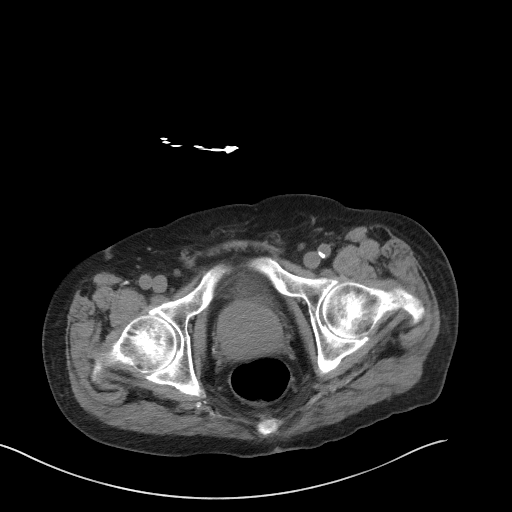
[im 31/105  soft-tissue]
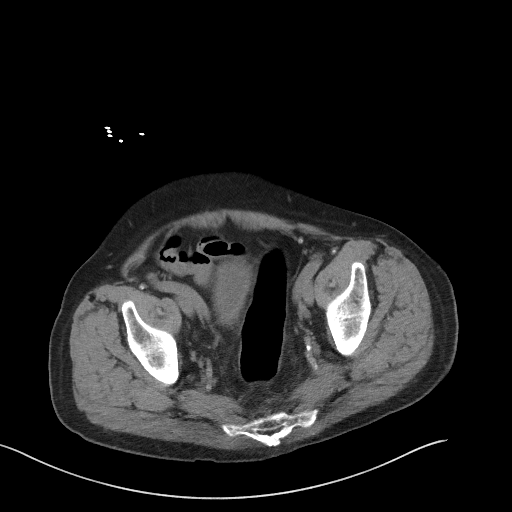
[im 35/105  soft-tissue]
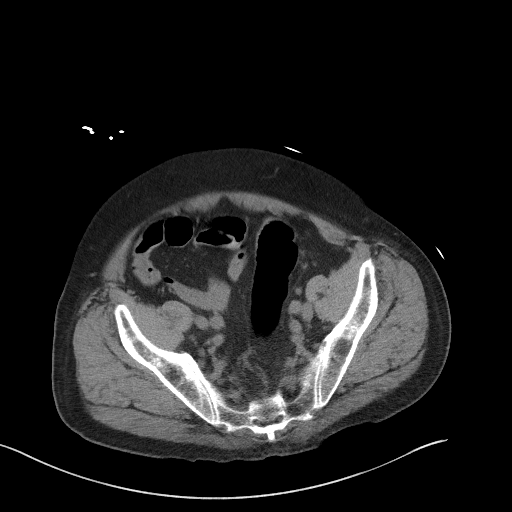
[im 44/105  soft-tissue]
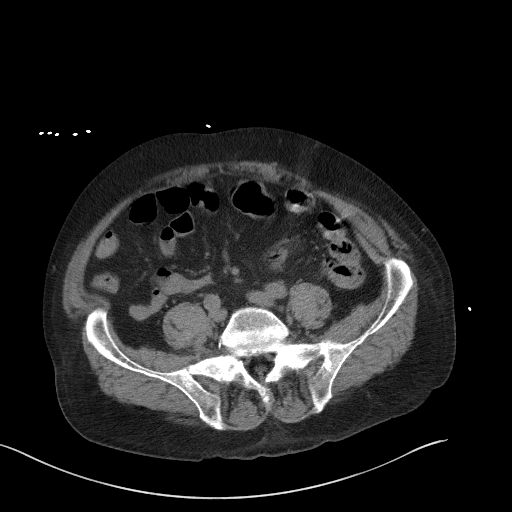
[im 53/105  soft-tissue]
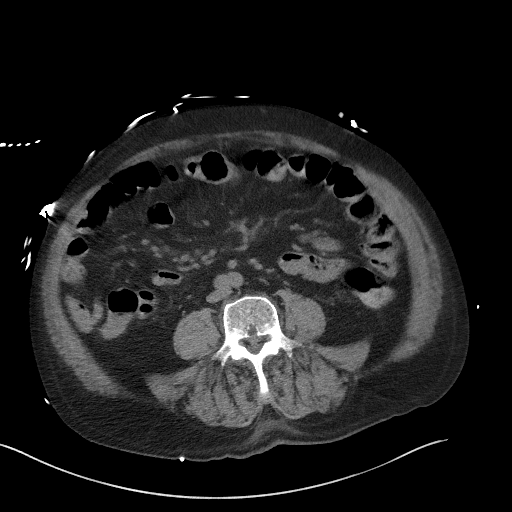
[im 61/105  soft-tissue]
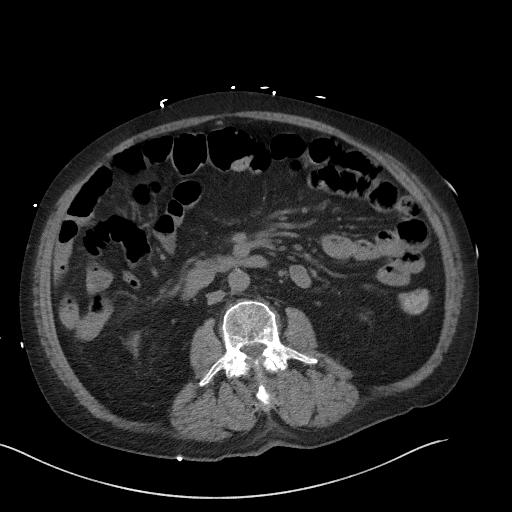
[im 70/105  soft-tissue]
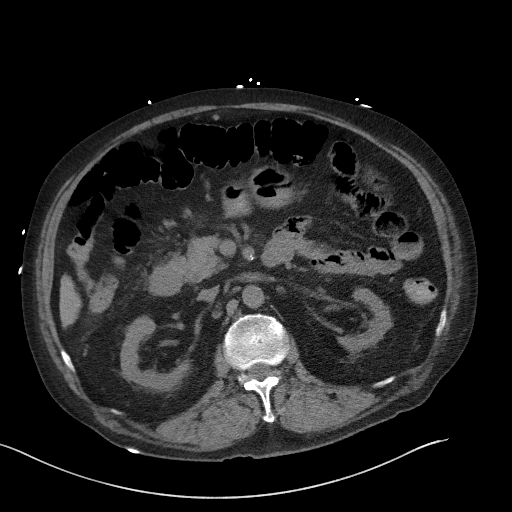
[im 70/105  bone]
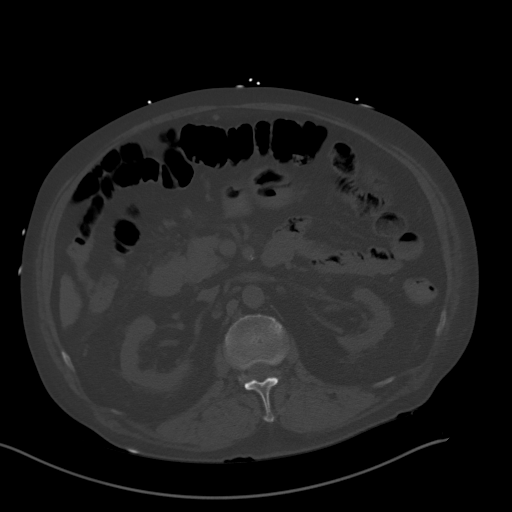
[im 74/105  soft-tissue]
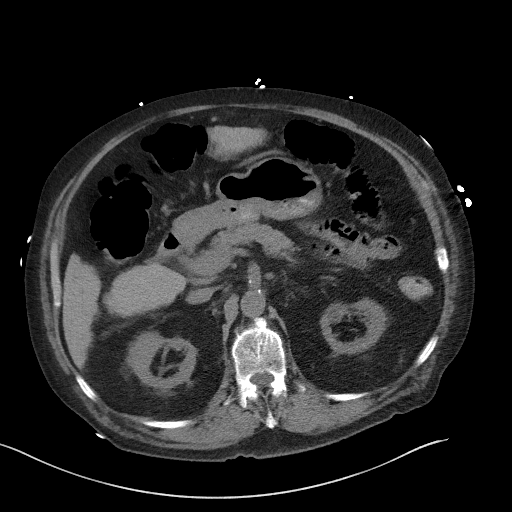
[im 83/105  soft-tissue]
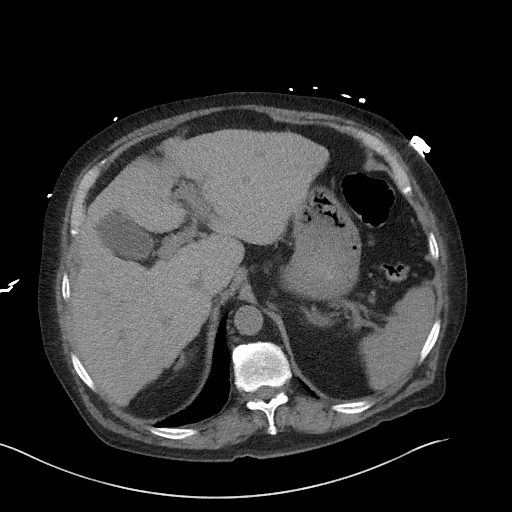
[im 92/105  soft-tissue]
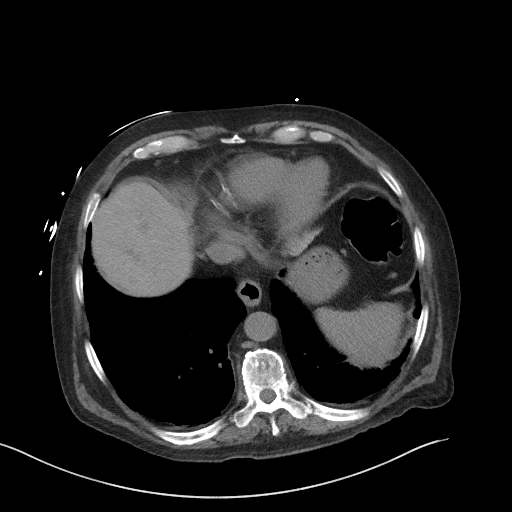
[im 100/105  soft-tissue]
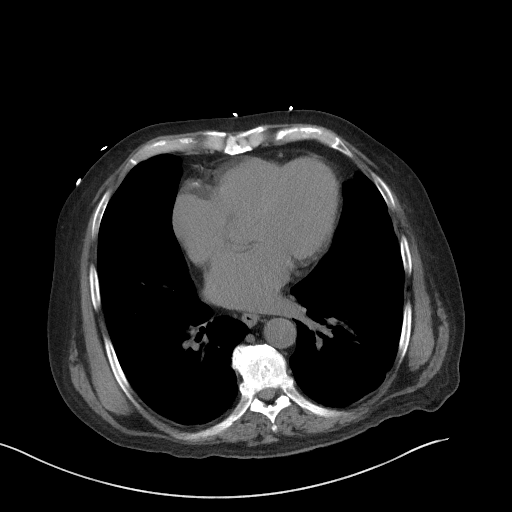

[Series 5: coronal st · coronal · 0.79mm/px · 3 of 102 slices shown]
[im 34/102  soft-tissue]
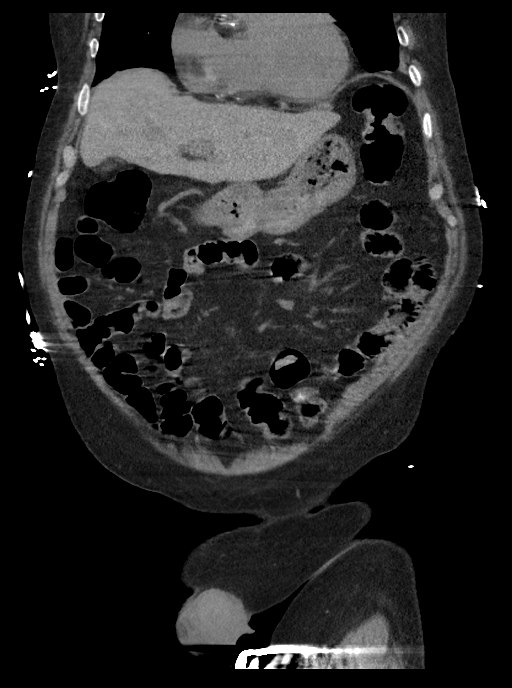
[im 45/102  soft-tissue]
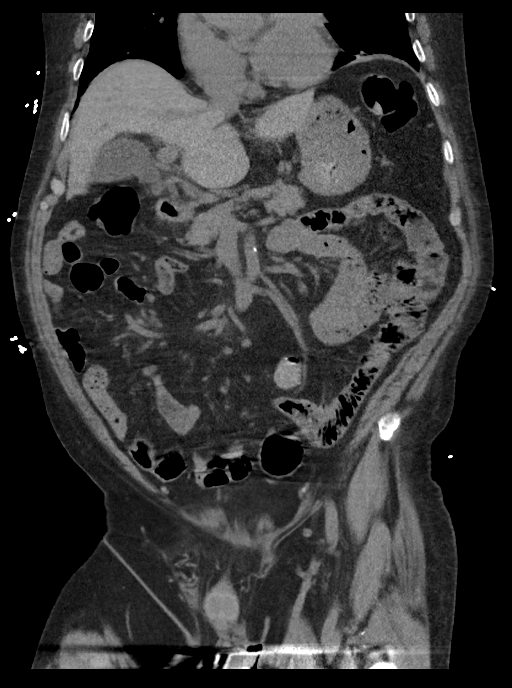
[im 57/102  soft-tissue]
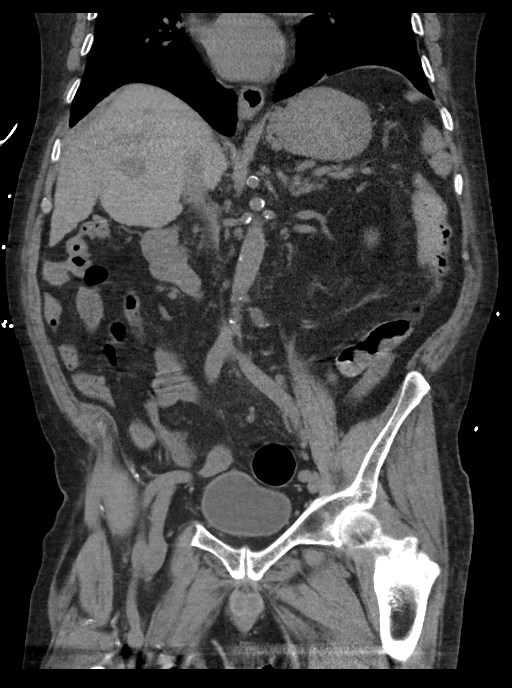

[16 of 46 positions shown; findings below may reference images not displayed]

FINDINGS: Lower chest: Minimal scarring is noted in the left lower lobe. No
focal infiltrate or sizable parenchymal nodule is seen.

Hepatobiliary: Mild nodularity of the liver is again identified
suspicious for cirrhosis.. No gallstones, gallbladder wall
thickening, or biliary dilatation.

Pancreas: Unremarkable. No pancreatic ductal dilatation or
surrounding inflammatory changes.

Spleen: Within normal limits.

Adrenals/Urinary Tract: Adrenal glands are within normal limits.
Kidneys demonstrate cortical thinning but stable from the prior
exam. A cystic lesion is noted in the upper pole of the left kidney.
No calculi or obstructive changes are noted. Mild renal vascular
calcifications are seen. The bladder is partially distended.

Stomach/Bowel: Stomach is within normal limits. Appendix is not well
seen although no inflammatory changes to suggest appendicitis are
noted.. No evidence of bowel wall thickening, distention, or
inflammatory changes.

Vascular/Lymphatic: Aortic atherosclerosis. No enlarged abdominal or
pelvic lymph nodes.

Reproductive: Prostate is unremarkable.

Other: No abdominal wall hernia or abnormality. No abdominopelvic
ascites.

Musculoskeletal: Degenerative changes of lumbar spine are seen. No
compression deformities are noted. No significant disc pathology is
noted.
IMPRESSION: Chronic changes as described above without acute abnormality.

## 2020-03-03 ENCOUNTER — Other Ambulatory Visit (INDEPENDENT_AMBULATORY_CARE_PROVIDER_SITE_OTHER): Payer: Self-pay | Admitting: Nurse Practitioner

## 2020-03-03 DIAGNOSIS — N186 End stage renal disease: Secondary | ICD-10-CM

## 2020-03-06 ENCOUNTER — Ambulatory Visit (INDEPENDENT_AMBULATORY_CARE_PROVIDER_SITE_OTHER): Payer: Medicare Other

## 2020-03-06 ENCOUNTER — Other Ambulatory Visit: Payer: Self-pay

## 2020-03-06 ENCOUNTER — Ambulatory Visit (INDEPENDENT_AMBULATORY_CARE_PROVIDER_SITE_OTHER): Payer: Medicare Other | Admitting: Nurse Practitioner

## 2020-03-06 ENCOUNTER — Encounter (INDEPENDENT_AMBULATORY_CARE_PROVIDER_SITE_OTHER): Payer: Self-pay | Admitting: Nurse Practitioner

## 2020-03-06 VITALS — BP 157/66 | HR 73 | Resp 16 | Wt 178.0 lb

## 2020-03-06 DIAGNOSIS — Z992 Dependence on renal dialysis: Secondary | ICD-10-CM

## 2020-03-06 DIAGNOSIS — N186 End stage renal disease: Secondary | ICD-10-CM

## 2020-03-06 DIAGNOSIS — I10 Essential (primary) hypertension: Secondary | ICD-10-CM

## 2020-03-06 DIAGNOSIS — M25512 Pain in left shoulder: Secondary | ICD-10-CM | POA: Diagnosis not present

## 2020-03-06 NOTE — Progress Notes (Signed)
SUBJECTIVE:  Patient ID: Martin Gilbert, male    DOB: [birth year], 79 y.o.   MRN: [id number] Chief Complaint  Patient presents with  . Follow-up    59month ultrasound follow up    HPI  Martin Gilbert is a 79 y.o. male The patient returns to the office for followup of their dialysis access. The function of the access has been stable. The patient denies increased bleeding time or increased recirculation. Patient denies difficulty with cannulation. The patient denies hand pain or other symptoms consistent with steal phenomena.  No significant arm swelling.  The patient does however have issues with shoulder pain.  The patient states that this pain comes and goes sporadically with no typical pattern.  The shoulder is painful with palpation.  The patient denies redness or swelling at the access site. The patient denies fever or chills at home or while on dialysis.  The patient denies amaurosis fugax or recent TIA symptoms. There are no recent neurological changes noted. The patient denies claudication symptoms or rest pain symptoms. The patient denies history of DVT, PE or superficial thrombophlebitis. The patient denies recent episodes of angina or shortness of breath.   Duplex ultrasound of the AV access shows a patent access with uniform velocities.  No focal hemodynamically significant stenosis.  Patient has a flow volume of 1367.  No evidence of still present.      Past Medical History:  Diagnosis Date  . Anemia   . Back pain   . BPH (benign prostatic hyperplasia)   . CKD (chronic kidney disease), stage IV ([place])   . Clostridium difficile colitis   . Diabetes mellitus without complication ([place])   . GERD (gastroesophageal reflux disease)   . HTN (hypertension)   . Hyperlipidemia     Past Surgical History:  Procedure Laterality Date  . A/V FISTULAGRAM Left 04/20/2018   Procedure: A/V FISTULAGRAM;  Surgeon: Algernon Huxley, MD;  Location: [facility];  Service:  Cardiovascular;  Laterality: Left;  . A/V FISTULAGRAM Left 11/25/2018   Procedure: A/V FISTULAGRAM;  Surgeon: Algernon Huxley, MD;  Location: [place] [facility];  Service: Cardiovascular;  Laterality: Left;  . A/V FISTULAGRAM Left 01/11/2019   Procedure: A/V FISTULAGRAM;  Surgeon: Algernon Huxley, MD;  Location: [place] [facility];  Service: Cardiovascular;  Laterality: Left;  . AV FISTULA PLACEMENT Left 06/13/2017   Procedure: ARTERIOVENOUS (AV) FISTULA CREATION ( RADIAL CEPHALIC );  Surgeon: Katha Cabal, MD;  Location: [facility];  Service: Vascular;  Laterality: Left;  . CATARACT EXTRACTION     one eye not sure which  . DIALYSIS/PERMA CATHETER INSERTION N/A 04/03/2017   Procedure: Dialysis/Perma Catheter Insertion;  Surgeon: Algernon Huxley, MD;  Location: [place] [facility];  Service: Cardiovascular;  Laterality: N/A;  . DIALYSIS/PERMA CATHETER INSERTION N/A 08/06/2017   Procedure: DIALYSIS/PERMA CATHETER INSERTION;  Surgeon: Algernon Huxley, MD;  Location: [place] [facility];  Service: Cardiovascular;  Laterality: N/A;  . DIALYSIS/PERMA CATHETER REMOVAL N/A 08/04/2017   Procedure: DIALYSIS/PERMA CATHETER REMOVAL;  Surgeon: Algernon Huxley, MD;  Location: [place] [facility];  Service: Cardiovascular;  Laterality: N/A;  . DIALYSIS/PERMA CATHETER REMOVAL N/A 12/31/2017   Procedure: DIALYSIS/PERMA CATHETER REMOVAL;  Surgeon: Algernon Huxley, MD;  Location: [place] [facility];  Service: Cardiovascular;  Laterality: N/A;  . hernia repair    . SACROPLASTY N/A 09/30/2018   Procedure: SACROPLASTY;  Surgeon: Hessie Knows, MD;  Location: [facility];  Service: Orthopedics;  Laterality:  N/A;  . UPPER EXTREMITY ANGIOGRAPHY Left 09/05/2017   Procedure: Upper Extremity Angiography;  Surgeon: Katha Cabal, MD;  Location: [place] [facility];  Service: Cardiovascular;  Laterality: Left;    Social History   Socioeconomic History  . Marital status: Married    Spouse name: Not on file  . Number of  children: Not on file  . Years of education: Not on file  . Highest education level: Not on file  Occupational History  . Occupation: retired  Tobacco Use  . Smoking status: Never Smoker  . Smokeless tobacco: Never Used  Substance and Sexual Activity  . Alcohol use: No    Comment: Drank in the past, but has stopped for several years.   . Drug use: No  . Sexual activity: Not on file  Other Topics Concern  . Not on file  Social History Narrative  . Not on file   Social Determinants of Health   Financial Resource Strain:   . Difficulty of Paying Living Expenses:   Food Insecurity:   . Worried About Charity fundraiser in the Last Year:   . Arboriculturist in the Last Year:   Transportation Needs:   . Film/video editor (Medical):   Marland Kitchen Lack of Transportation (Non-Medical):   Physical Activity:   . Days of Exercise per Week:   . Minutes of Exercise per Session:   Stress:   . Feeling of Stress :   Social Connections:   . Frequency of Communication with Friends and Family:   . Frequency of Social Gatherings with Friends and Family:   . Attends Religious Services:   . Active Member of Clubs or Organizations:   . Attends Archivist Meetings:   Marland Kitchen Marital Status:   Intimate Partner Violence:   . Fear of Current or Ex-Partner:   . Emotionally Abused:   Marland Kitchen Physically Abused:   . Sexually Abused:     Family History  Problem Relation Age of Onset  . Hypertension Mother   . Diabetes Neg Hx     No Known Allergies   Review of Systems   Review of Systems: Negative Unless Checked Constitutional: [] Weight loss  [] Fever  [] Chills Cardiac: [] Chest pain   []  Atrial Fibrillation  [] Palpitations   [] Shortness of breath when laying flat   [] Shortness of breath with exertion. [] Shortness of breath at rest Vascular:  [] Pain in legs with walking   [] Pain in legs with standing [] Pain in legs when laying flat   [] Claudication    [] Pain in feet when laying flat    [] History of  DVT   [] Phlebitis   [] Swelling in legs   [] Varicose veins   [] Non-healing ulcers Pulmonary:   [] Uses home oxygen   [] Productive cough   [] Hemoptysis   [] Wheeze  [] COPD   [] Asthma Neurologic:  [] Dizziness   [] Seizures  [] Blackouts [] History of stroke   [] History of TIA  [] Aphasia   [] Temporary Blindness   [] Weakness or numbness in arm   [] Weakness or numbness in leg Musculoskeletal:   [] Joint swelling   [] Joint pain   [] Low back pain  []  History of Knee Replacement [x] Arthritis [] back Surgeries  []  Spinal Stenosis    Hematologic:  [] Easy bruising  [] Easy bleeding   [] Hypercoagulable state   [x] Anemic Gastrointestinal:  [] Diarrhea   [] Vomiting  [] Gastroesophageal reflux/heartburn   [] Difficulty swallowing. [] Abdominal pain Genitourinary:  [x] Chronic kidney disease   [] Difficult urination  [] Anuric   [] Blood in urine [] Frequent urination  []   Burning with urination   [] Hematuria Skin:  [] Rashes   [] Ulcers [] Wounds Psychological:  [] History of anxiety   []  History of major depression  []  Memory Difficulties      OBJECTIVE:   Physical Exam  BP (!) 157/66 (BP Location: Right Arm)   Pulse 73   Resp 16   Wt 178 lb (80.7 kg)   BMI 25.54 kg/m   Gen: WD/WN, NAD Head: [place]/AT, No temporalis wasting.  Ear/Nose/Throat: Hearing grossly intact, nares w/o erythema or drainage Eyes: PER, EOMI, sclera nonicteric.  Neck: Supple, no masses.  No JVD.  Pulmonary:  Good air movement, no use of accessory muscles.  Cardiac: RRR Vascular:  Good thrill and bruit Vessel Right Left  Radial Palpable Palpable   Gastrointestinal: soft, non-distended. No guarding/no peritoneal signs.  Musculoskeletal: M/S 5/5 throughout.  No deformity or atrophy.  Painful left shoulder on palpation Neurologic: Pain and light touch intact in extremities.  Symmetrical.  Speech is fluent. Motor exam as listed above. Psychiatric: Judgment intact, Mood & affect appropriate for pt's clinical situation. Dermatologic: No Venous rashes. No  Ulcers Noted.  No changes consistent with cellulitis. Lymph : No Cervical lymphadenopathy, no lichenification or skin changes of chronic lymphedema.       ASSESSMENT AND PLAN:  1. ESRD on dialysis [facility]) Recommend:  The patient is doing well and currently has adequate dialysis access. The patient's dialysis center is not reporting any access issues. Flow pattern is stable when compared to the prior ultrasound.  The patient should have a duplex ultrasound of the dialysis access in 6 months. The patient will follow-up with me in the office after each ultrasound    - VAS [place] (AVF, AVG); Future  2. Pain in joint of left shoulder Based on the patient's pain in addition to the pain when palpating this is likely an orthopedic issue.  We have ruled out still as a possible cause.  The patient has previously seen orthopedic before for issues with his back.  Will place referral back to the office for further work-up. - Ambulatory referral to Orthopedic Surgery  3. Essential hypertension Continue antihypertensive medications as already ordered, these medications have been reviewed and there are no changes at this time.    Current Outpatient Medications on File Prior to Visit  Medication Sig Dispense Refill  . acetaminophen (TYLENOL) 325 MG tablet Take 2 tablets (650 mg total) by mouth every 6 (six) hours as needed for mild pain (or Fever >/= 101).    Marland Kitchen allopurinol (ZYLOPRIM) 100 MG tablet Take 100 mg by mouth daily.    Marland Kitchen aspirin 81 MG tablet Take 81 mg by mouth daily.    Marland Kitchen atorvastatin (LIPITOR) 20 MG tablet Take 20 mg by mouth daily.    . ferrous sulfate 325 (65 FE) MG tablet Take 325 mg by mouth 2 (two) times daily with a meal.   11  . glimepiride (AMARYL) 2 MG tablet Take 2 mg by mouth 2 (two) times daily.     . hydrALAZINE (APRESOLINE) 50 MG tablet Take 50 mg by mouth 3 (three) times daily.    Marland Kitchen lidocaine-prilocaine (EMLA) cream Apply 1 application topically daily  as needed.    . metoprolol succinate (TOPROL-XL) 25 MG 24 hr tablet Take 25 mg by mouth daily.    . multivitamin (RENA-VIT) TABS tablet Take 1 tablet by mouth at bedtime.  0  . oxyCODONE-acetaminophen (PERCOCET/ROXICET) 5-325 MG tablet Take 1 tablet by mouth every 6 (six) hours  as needed for moderate pain or severe pain.    . pantoprazole (PROTONIX) 40 MG tablet Take 1 tablet (40 mg total) by mouth daily. 30 tablet 6  . Polyvinyl Alcohol-Povidone (REFRESH OP) Place 1 drop into both eyes 2 (two) times daily.    . sodium bicarbonate 650 MG tablet Take 650 mg by mouth 2 (two) times daily.    . tamsulosin (FLOMAX) 0.4 MG CAPS capsule Take 1 capsule (0.4 mg total) by mouth daily after supper. 30 capsule 0  . Vitamin D, Cholecalciferol, 1000 UNITS TABS Take 1 tablet by mouth daily.     Marland Kitchen HYDROcodone-acetaminophen (NORCO) 5-325 MG tablet Take 1 tablet by mouth every 6 (six) hours as needed for moderate pain or severe pain. (Patient not taking: Reported on 01/11/2019) 25 tablet 0   No current facility-administered medications on file prior to visit.    There are no Patient Instructions on file for this visit. No follow-ups on file.   Kris Hartmann, NP  This note was completed with Sales executive.  Any errors are purely unintentional.

## 2020-07-18 ENCOUNTER — Emergency Department: Payer: Medicare Other

## 2020-07-18 ENCOUNTER — Other Ambulatory Visit: Payer: Self-pay

## 2020-07-18 ENCOUNTER — Encounter: Payer: Self-pay | Admitting: Intensive Care

## 2020-07-18 ENCOUNTER — Inpatient Hospital Stay
Admission: EM | Admit: 2020-07-18 | Discharge: 2020-07-21 | DRG: 441 | Disposition: A | Discharge status: Home Health | Payer: Medicare Other | Source: Ambulatory Visit | Attending: Student | Admitting: Student

## 2020-07-18 DIAGNOSIS — R1110 Vomiting, unspecified: Secondary | ICD-10-CM

## 2020-07-18 DIAGNOSIS — E1143 Type 2 diabetes mellitus with diabetic autonomic (poly)neuropathy: Secondary | ICD-10-CM | POA: Diagnosis present

## 2020-07-18 DIAGNOSIS — R112 Nausea with vomiting, unspecified: Secondary | ICD-10-CM | POA: Diagnosis present

## 2020-07-18 DIAGNOSIS — M549 Dorsalgia, unspecified: Secondary | ICD-10-CM | POA: Diagnosis present

## 2020-07-18 DIAGNOSIS — R54 Age-related physical debility: Secondary | ICD-10-CM | POA: Diagnosis present

## 2020-07-18 DIAGNOSIS — G9341 Metabolic encephalopathy: Secondary | ICD-10-CM | POA: Diagnosis present

## 2020-07-18 DIAGNOSIS — K921 Melena: Secondary | ICD-10-CM | POA: Diagnosis not present

## 2020-07-18 DIAGNOSIS — K7460 Unspecified cirrhosis of liver: Secondary | ICD-10-CM | POA: Diagnosis present

## 2020-07-18 DIAGNOSIS — I10 Essential (primary) hypertension: Secondary | ICD-10-CM | POA: Diagnosis not present

## 2020-07-18 DIAGNOSIS — E785 Hyperlipidemia, unspecified: Secondary | ICD-10-CM | POA: Diagnosis present

## 2020-07-18 DIAGNOSIS — D631 Anemia in chronic kidney disease: Secondary | ICD-10-CM | POA: Diagnosis present

## 2020-07-18 DIAGNOSIS — IMO0002 Reserved for concepts with insufficient information to code with codable children: Secondary | ICD-10-CM | POA: Diagnosis present

## 2020-07-18 DIAGNOSIS — N2581 Secondary hyperparathyroidism of renal origin: Secondary | ICD-10-CM | POA: Diagnosis present

## 2020-07-18 DIAGNOSIS — K7290 Hepatic failure, unspecified without coma: Secondary | ICD-10-CM | POA: Diagnosis present

## 2020-07-18 DIAGNOSIS — N186 End stage renal disease: Secondary | ICD-10-CM

## 2020-07-18 DIAGNOSIS — I440 Atrioventricular block, first degree: Secondary | ICD-10-CM | POA: Diagnosis present

## 2020-07-18 DIAGNOSIS — E1165 Type 2 diabetes mellitus with hyperglycemia: Secondary | ICD-10-CM

## 2020-07-18 DIAGNOSIS — E1122 Type 2 diabetes mellitus with diabetic chronic kidney disease: Secondary | ICD-10-CM | POA: Diagnosis present

## 2020-07-18 DIAGNOSIS — R4182 Altered mental status, unspecified: Secondary | ICD-10-CM

## 2020-07-18 DIAGNOSIS — N400 Enlarged prostate without lower urinary tract symptoms: Secondary | ICD-10-CM | POA: Diagnosis present

## 2020-07-18 DIAGNOSIS — R319 Hematuria, unspecified: Secondary | ICD-10-CM | POA: Diagnosis not present

## 2020-07-18 DIAGNOSIS — K7200 Acute and subacute hepatic failure without coma: Secondary | ICD-10-CM | POA: Diagnosis not present

## 2020-07-18 DIAGNOSIS — G8929 Other chronic pain: Secondary | ICD-10-CM | POA: Diagnosis present

## 2020-07-18 DIAGNOSIS — I509 Heart failure, unspecified: Secondary | ICD-10-CM | POA: Diagnosis present

## 2020-07-18 DIAGNOSIS — Z992 Dependence on renal dialysis: Secondary | ICD-10-CM | POA: Diagnosis not present

## 2020-07-18 DIAGNOSIS — K7469 Other cirrhosis of liver: Secondary | ICD-10-CM | POA: Diagnosis not present

## 2020-07-18 DIAGNOSIS — Z8249 Family history of ischemic heart disease and other diseases of the circulatory system: Secondary | ICD-10-CM | POA: Diagnosis not present

## 2020-07-18 DIAGNOSIS — Z789 Other specified health status: Secondary | ICD-10-CM | POA: Diagnosis not present

## 2020-07-18 DIAGNOSIS — R9431 Abnormal electrocardiogram [ECG] [EKG]: Secondary | ICD-10-CM

## 2020-07-18 DIAGNOSIS — Z20822 Contact with and (suspected) exposure to covid-19: Secondary | ICD-10-CM | POA: Diagnosis present

## 2020-07-18 DIAGNOSIS — I132 Hypertensive heart and chronic kidney disease with heart failure and with stage 5 chronic kidney disease, or end stage renal disease: Secondary | ICD-10-CM | POA: Diagnosis present

## 2020-07-18 DIAGNOSIS — R7989 Other specified abnormal findings of blood chemistry: Secondary | ICD-10-CM

## 2020-07-18 DIAGNOSIS — R5381 Other malaise: Secondary | ICD-10-CM | POA: Diagnosis not present

## 2020-07-18 DIAGNOSIS — K219 Gastro-esophageal reflux disease without esophagitis: Secondary | ICD-10-CM | POA: Diagnosis present

## 2020-07-18 HISTORY — DX: Unspecified osteoarthritis, unspecified site: M19.90

## 2020-07-18 LAB — COMPREHENSIVE METABOLIC PANEL
ALT: 26 U/L (ref 0–44)
AST: 40 U/L (ref 15–41)
Albumin: 3.3 g/dL — ABNORMAL LOW (ref 3.5–5.0)
Alkaline Phosphatase: 212 U/L — ABNORMAL HIGH (ref 38–126)
Anion gap: 13 (ref 5–15)
BUN: 46 mg/dL — ABNORMAL HIGH (ref 8–23)
CO2: 28 mmol/L (ref 22–32)
Calcium: 8.7 mg/dL — ABNORMAL LOW (ref 8.9–10.3)
Chloride: 96 mmol/L — ABNORMAL LOW (ref 98–111)
Creatinine, Ser: 4.58 mg/dL — ABNORMAL HIGH (ref 0.61–1.24)
GFR calc Af Amer: 13 mL/min — ABNORMAL LOW (ref >60)
GFR calc non Af Amer: 11 mL/min — ABNORMAL LOW (ref >60)
Glucose, Bld: 163 mg/dL — ABNORMAL HIGH (ref 70–99)
Potassium: 3.9 mmol/L (ref 3.5–5.1)
Sodium: 137 mmol/L (ref 135–145)
Total Bilirubin: 1.9 mg/dL — ABNORMAL HIGH (ref 0.3–1.2)
Total Protein: 7.2 g/dL (ref 6.5–8.1)

## 2020-07-18 LAB — CBC WITH DIFFERENTIAL/PLATELET
Abs Immature Granulocytes: 0.01 10*3/uL (ref 0.00–0.07)
Basophils Absolute: 0 10*3/uL (ref 0.0–0.1)
Basophils Relative: 1 %
Eosinophils Absolute: 0.1 10*3/uL (ref 0.0–0.5)
Eosinophils Relative: 1 %
HCT: 38.2 % — ABNORMAL LOW (ref 39.0–52.0)
Hemoglobin: 13.1 g/dL (ref 13.0–17.0)
Immature Granulocytes: 0 %
Lymphocytes Relative: 22 %
Lymphs Abs: 1.2 10*3/uL (ref 0.7–4.0)
MCH: 33 pg (ref 26.0–34.0)
MCHC: 34.3 g/dL (ref 30.0–36.0)
MCV: 96.2 fL (ref 80.0–100.0)
Monocytes Absolute: 0.5 10*3/uL (ref 0.1–1.0)
Monocytes Relative: 10 %
Neutro Abs: 3.5 10*3/uL (ref 1.7–7.7)
Neutrophils Relative %: 66 %
Platelets: 200 10*3/uL (ref 150–400)
RBC: 3.97 MIL/uL — ABNORMAL LOW (ref 4.22–5.81)
RDW: 14.3 % (ref 11.5–15.5)
WBC: 5.3 10*3/uL (ref 4.0–10.5)
nRBC: 0 % (ref 0.0–0.2)

## 2020-07-18 LAB — URINE DRUG SCREEN, QUALITATIVE (ARMC ONLY)
Amphetamines, Ur Screen: NOT DETECTED
Barbiturates, Ur Screen: NOT DETECTED
Benzodiazepine, Ur Scrn: NOT DETECTED
Cannabinoid 50 Ng, Ur ~~LOC~~: NOT DETECTED
Cocaine Metabolite,Ur ~~LOC~~: NOT DETECTED
MDMA (Ecstasy)Ur Screen: NOT DETECTED
Methadone Scn, Ur: NOT DETECTED
Opiate, Ur Screen: NOT DETECTED
Phencyclidine (PCP) Ur S: NOT DETECTED
Tricyclic, Ur Screen: NOT DETECTED

## 2020-07-18 LAB — URINALYSIS, COMPLETE (UACMP) WITH MICROSCOPIC
Bacteria, UA: NONE SEEN
Bilirubin Urine: NEGATIVE
Glucose, UA: 50 mg/dL — AB
Hgb urine dipstick: NEGATIVE
Ketones, ur: NEGATIVE mg/dL
Leukocytes,Ua: NEGATIVE
Nitrite: NEGATIVE
Protein, ur: >=300 mg/dL — AB
Specific Gravity, Urine: 1.014 (ref 1.005–1.030)
Squamous Epithelial / HPF: NONE SEEN (ref 0–5)
pH: 8 (ref 5.0–8.0)

## 2020-07-18 LAB — BLOOD GAS, VENOUS
Acid-Base Excess: 9.7 mmol/L — ABNORMAL HIGH (ref 0.0–2.0)
Bicarbonate: 33.5 mmol/L — ABNORMAL HIGH (ref 20.0–28.0)
O2 Saturation: 98.1 %
Patient temperature: 37
pCO2, Ven: 41 mmHg — ABNORMAL LOW (ref 44.0–60.0)
pH, Ven: 7.52 — ABNORMAL HIGH (ref 7.250–7.430)
pO2, Ven: 95 mmHg — ABNORMAL HIGH (ref 32.0–45.0)

## 2020-07-18 LAB — ETHANOL: Alcohol, Ethyl (B): <10 mg/dL (ref <10)

## 2020-07-18 LAB — GLUCOSE, CAPILLARY
Glucose-Capillary: 82 mg/dL (ref 70–99)
Glucose-Capillary: 98 mg/dL (ref 70–99)
Glucose-Capillary: 101 mg/dL — ABNORMAL HIGH (ref 70–99)

## 2020-07-18 LAB — LACTIC ACID, PLASMA
Lactic Acid, Venous: 1.8 mmol/L (ref 0.5–1.9)
Lactic Acid, Venous: 1.1 mmol/L (ref 0.5–1.9)

## 2020-07-18 LAB — CBC
HCT: 37 % — ABNORMAL LOW (ref 39.0–52.0)
Hemoglobin: 12.4 g/dL — ABNORMAL LOW (ref 13.0–17.0)
MCH: 32.7 pg (ref 26.0–34.0)
MCHC: 33.5 g/dL (ref 30.0–36.0)
MCV: 97.6 fL (ref 80.0–100.0)
Platelets: 193 10*3/uL (ref 150–400)
RBC: 3.79 MIL/uL — ABNORMAL LOW (ref 4.22–5.81)
RDW: 14 % (ref 11.5–15.5)
WBC: 5 10*3/uL (ref 4.0–10.5)
nRBC: 0 % (ref 0.0–0.2)

## 2020-07-18 LAB — SARS CORONAVIRUS 2 BY RT PCR (HOSPITAL ORDER, PERFORMED IN ~~LOC~~ HOSPITAL LAB): SARS Coronavirus 2: NEGATIVE

## 2020-07-18 LAB — BILIRUBIN, DIRECT: Bilirubin, Direct: 0.5 mg/dL — ABNORMAL HIGH (ref 0.0–0.2)

## 2020-07-18 LAB — AMMONIA: Ammonia: 61 umol/L — ABNORMAL HIGH (ref 9–35)

## 2020-07-18 LAB — HEMOGLOBIN A1C
Hgb A1c MFr Bld: 5.3 % (ref 4.8–5.6)
Mean Plasma Glucose: 105.41 mg/dL

## 2020-07-18 LAB — TSH: TSH: 1.791 u[IU]/mL (ref 0.350–4.500)

## 2020-07-18 LAB — LIPASE, BLOOD: Lipase: 27 U/L (ref 11–51)

## 2020-07-18 LAB — MAGNESIUM: Magnesium: 2 mg/dL (ref 1.7–2.4)

## 2020-07-18 MED ORDER — RIFAXIMIN 200 MG PO TABS
200.0000 mg | ORAL_TABLET | Freq: Two times a day (BID) | ORAL | Status: DC
  Administered 2020-07-18 – 2020-07-21 (×4): 200 mg via ORAL
  Filled 2020-07-18 (×7): qty 1

## 2020-07-18 MED ORDER — METOPROLOL SUCCINATE ER 25 MG PO TB24
25.0000 mg | ORAL_TABLET | Freq: Every day | ORAL | Status: DC
  Administered 2020-07-19 – 2020-07-21 (×3): 25 mg via ORAL
  Filled 2020-07-18 (×3): qty 1

## 2020-07-18 MED ORDER — ASPIRIN EC 81 MG PO TBEC
81.0000 mg | DELAYED_RELEASE_TABLET | Freq: Every day | ORAL | Status: DC
  Administered 2020-07-19: 81 mg via ORAL
  Filled 2020-07-18: qty 1

## 2020-07-18 MED ORDER — HEPARIN SODIUM (PORCINE) 5000 UNIT/ML IJ SOLN
5000.0000 [IU] | Freq: Three times a day (TID) | INTRAMUSCULAR | Status: DC
  Administered 2020-07-18 – 2020-07-19 (×3): 5000 [IU] via SUBCUTANEOUS
  Filled 2020-07-18 (×2): qty 1

## 2020-07-18 MED ORDER — ATORVASTATIN CALCIUM 20 MG PO TABS
20.0000 mg | ORAL_TABLET | Freq: Every day | ORAL | Status: DC
  Administered 2020-07-19 – 2020-07-21 (×3): 20 mg via ORAL
  Filled 2020-07-18 (×3): qty 1

## 2020-07-18 MED ORDER — TAMSULOSIN HCL 0.4 MG PO CAPS
0.4000 mg | ORAL_CAPSULE | Freq: Every day | ORAL | Status: DC
  Administered 2020-07-18 – 2020-07-21 (×4): 0.4 mg via ORAL
  Filled 2020-07-18 (×4): qty 1

## 2020-07-18 MED ORDER — INSULIN ASPART 100 UNIT/ML ~~LOC~~ SOLN: 0.0000 [IU] | Freq: Every day | SUBCUTANEOUS | Status: DC

## 2020-07-18 MED ORDER — ACETAMINOPHEN 325 MG PO TABS
650.0000 mg | ORAL_TABLET | Freq: Four times a day (QID) | ORAL | Status: DC | PRN
  Administered 2020-07-20 – 2020-07-21 (×3): 650 mg via ORAL
  Filled 2020-07-18 (×3): qty 2

## 2020-07-18 MED ORDER — LACTULOSE 10 GM/15ML PO SOLN
30.0000 g | Freq: Three times a day (TID) | ORAL | Status: DC
  Administered 2020-07-18 – 2020-07-21 (×7): 30 g via ORAL
  Filled 2020-07-18 (×8): qty 60

## 2020-07-18 MED ORDER — INSULIN ASPART 100 UNIT/ML ~~LOC~~ SOLN: 0.0000 [IU] | Freq: Three times a day (TID) | SUBCUTANEOUS | Status: DC

## 2020-07-18 MED ORDER — ALLOPURINOL 100 MG PO TABS
100.0000 mg | ORAL_TABLET | Freq: Every day | ORAL | Status: DC
  Administered 2020-07-19 – 2020-07-21 (×3): 100 mg via ORAL
  Filled 2020-07-18 (×3): qty 1

## 2020-07-18 MED ORDER — HYDRALAZINE HCL 50 MG PO TABS
50.0000 mg | ORAL_TABLET | Freq: Three times a day (TID) | ORAL | Status: DC
  Administered 2020-07-18 – 2020-07-21 (×8): 50 mg via ORAL
  Filled 2020-07-18 (×8): qty 1

## 2020-07-18 MED ORDER — PANTOPRAZOLE SODIUM 40 MG PO TBEC
40.0000 mg | DELAYED_RELEASE_TABLET | Freq: Every day | ORAL | Status: DC
  Administered 2020-07-19 – 2020-07-21 (×3): 40 mg via ORAL
  Filled 2020-07-18 (×3): qty 1

## 2020-07-18 MED ORDER — SODIUM BICARBONATE 650 MG PO TABS
650.0000 mg | ORAL_TABLET | Freq: Two times a day (BID) | ORAL | Status: DC
  Administered 2020-07-18 – 2020-07-21 (×5): 650 mg via ORAL
  Filled 2020-07-18 (×7): qty 1

## 2020-07-18 MED ORDER — FERROUS SULFATE 325 (65 FE) MG PO TABS
325.0000 mg | ORAL_TABLET | Freq: Two times a day (BID) | ORAL | Status: DC
  Administered 2020-07-19 – 2020-07-21 (×5): 325 mg via ORAL
  Filled 2020-07-18 (×6): qty 1

## 2020-07-18 NOTE — ED Provider Notes (Signed)
[facility] Emergency Department Provider Note  ____________________________________________   First MD Initiated Contact with Patient 07/18/20 1201     (approximate)  I have reviewed the triage vital signs and the nursing notes.   HISTORY  Chief Complaint Weakness and Altered Mental Status   HPI Martin Gilbert is a 79 y.o. male with a past medical history of anemia, arthritis, chronic back pain, BPH, CKD on HD Tuesday Thursday Saturday, GERD, HTN, and HDL who presents via EMS after dialysis today patient was unable to speak or answer any questions.  She notes that he has been more fatigued over the last several days and has been too weak to ambulate over the last 2 days which he is normally able to do independently.  He is normally alert and oriented but beginning today has not been oriented at all and appeared quite confused.  Patient is unable to write any history secondary to altered mental status.  Per family at bedside patient has not had any recent falls but has had some vomiting.  No known diarrhea, cough, fevers, or other acute sick symptoms.  He reportedly is compliant with his medications aside from today.         Past Medical History:  Diagnosis Date  . Anemia   . Arthritis   . Back pain   . BPH (benign prostatic hyperplasia)   . CKD (chronic kidney disease), stage IV ([place])   . Clostridium difficile colitis   . Diabetes mellitus without complication ([place])   . GERD (gastroesophageal reflux disease)   . HTN (hypertension)   . Hyperlipidemia     Patient Active Problem List   Diagnosis Date Noted  . Acute gastroenteritis 10/11/2018  . Intractable nausea and vomiting 10/10/2018  . Sacral fracture ([place]) 09/29/2018  . Complication of arteriovenous dialysis fistula 09/01/2017  . ESRD on dialysis ([place]) 06/03/2017  . Colitis 03/27/2017  . Demand ischemia ([place]) 03/27/2017  . Enteritis due to Clostridium difficile   . Protein-calorie  malnutrition, severe 02/18/2017  . C. difficile colitis 02/15/2017  . UTI (urinary tract infection) 02/02/2017  . E coli infection 02/02/2017  . Lactic acidosis 02/02/2017  . Diabetic neuropathy ([place]) 02/02/2017  . Left-sided chest wall pain 02/02/2017  . Fall 02/02/2017  . Generalized weakness 02/02/2017  . Pink eye, left 02/02/2017  . Leukocytosis 02/02/2017  . Anemia of chronic disease 02/02/2017  . Nausea 02/02/2017  . Sepsis ([place]) 01/31/2017  . Foot pain 10/08/2016  . Osteoarthritis of knee 10/08/2016  . Essential hypertension 11/28/2015  . Chronic gout due to renal impairment, right ankle and foot, without tophus (tophi) 07/31/2015  . Bone lesion 06/05/2015  . Primary osteoarthritis of both knees 06/05/2015  . Pain in the chest 02/15/2015  . Neck pain 02/15/2015  . Diabetes type 2, uncontrolled ([place]) 02/15/2015  . Frequent headaches 02/15/2015  . Type 2 diabetes mellitus with hyperglycemia ([place]) 02/15/2015  . Hypertrophy of prostate with urinary obstruction and other lower urinary tract symptoms (LUTS) 09/07/2013    Past Surgical History:  Procedure Laterality Date  . A/V FISTULAGRAM Left 04/20/2018   Procedure: A/V FISTULAGRAM;  Surgeon: Algernon Huxley, MD;  Location: [place] [facility];  Service: Cardiovascular;  Laterality: Left;  . A/V FISTULAGRAM Left 11/25/2018   Procedure: A/V FISTULAGRAM;  Surgeon: Algernon Huxley, MD;  Location: [place] [facility];  Service: Cardiovascular;  Laterality: Left;  . A/V FISTULAGRAM Left 01/11/2019   Procedure: A/V FISTULAGRAM;  Surgeon: Algernon Huxley, MD;  Location: [place] [facility];  Service: Cardiovascular;  Laterality: Left;  . AV FISTULA PLACEMENT Left 06/13/2017   Procedure: ARTERIOVENOUS (AV) FISTULA CREATION ( RADIAL CEPHALIC );  Surgeon: Katha Cabal, MD;  Location: [facility];  Service: Vascular;  Laterality: Left;  . CATARACT EXTRACTION     one eye not sure which  . DIALYSIS/PERMA CATHETER INSERTION N/A 04/03/2017    Procedure: Dialysis/Perma Catheter Insertion;  Surgeon: Algernon Huxley, MD;  Location: [place] [facility];  Service: Cardiovascular;  Laterality: N/A;  . DIALYSIS/PERMA CATHETER INSERTION N/A 08/06/2017   Procedure: DIALYSIS/PERMA CATHETER INSERTION;  Surgeon: Algernon Huxley, MD;  Location: [place] [facility];  Service: Cardiovascular;  Laterality: N/A;  . DIALYSIS/PERMA CATHETER REMOVAL N/A 08/04/2017   Procedure: DIALYSIS/PERMA CATHETER REMOVAL;  Surgeon: Algernon Huxley, MD;  Location: [place] [facility];  Service: Cardiovascular;  Laterality: N/A;  . DIALYSIS/PERMA CATHETER REMOVAL N/A 12/31/2017   Procedure: DIALYSIS/PERMA CATHETER REMOVAL;  Surgeon: Algernon Huxley, MD;  Location: [place] [facility];  Service: Cardiovascular;  Laterality: N/A;  . hernia repair    . SACROPLASTY N/A 09/30/2018   Procedure: SACROPLASTY;  Surgeon: Hessie Knows, MD;  Location: [facility];  Service: Orthopedics;  Laterality: N/A;  . UPPER EXTREMITY ANGIOGRAPHY Left 09/05/2017   Procedure: Upper Extremity Angiography;  Surgeon: Katha Cabal, MD;  Location: [place] [facility];  Service: Cardiovascular;  Laterality: Left;    Prior to Admission medications   Medication Sig Start Date End Date Taking? Authorizing Provider  acetaminophen (TYLENOL) 325 MG tablet Take 2 tablets (650 mg total) by mouth every 6 (six) hours as needed for mild pain (or Fever >/= 101). 11/28/17   [name], MD  allopurinol (ZYLOPRIM) 100 MG tablet Take 100 mg by mouth daily. 10/17/15   [provider]  aspirin 81 MG tablet Take 81 mg by mouth daily.    [provider]  atorvastatin (LIPITOR) 20 MG tablet Take 20 mg by mouth daily.    [provider]  ferrous sulfate 325 (65 FE) MG tablet Take 325 mg by mouth 2 (two) times daily with a meal.  08/30/15   [provider]  glimepiride (AMARYL) 2 MG tablet Take 2 mg by mouth 2 (two) times daily.  04/24/17   [provider]  hydrALAZINE (APRESOLINE) 50  MG tablet Take 50 mg by mouth 3 (three) times daily.    [provider]  HYDROcodone-acetaminophen (NORCO) 5-325 MG tablet Take 1 tablet by mouth every 6 (six) hours as needed for moderate pain or severe pain. Patient not taking: Reported on 01/11/2019 10/01/18   Fritzi Mandes, MD  lidocaine-prilocaine (EMLA) cream Apply 1 application topically daily as needed. 10/16/17   [provider]  metoprolol succinate (TOPROL-XL) 25 MG 24 hr tablet Take 25 mg by mouth daily.    [provider]  multivitamin (RENA-VIT) TABS tablet Take 1 tablet by mouth at bedtime. 10/14/18   Mayo, Pete Pelt, MD  oxyCODONE-acetaminophen (PERCOCET/ROXICET) 5-325 MG tablet Take 1 tablet by mouth every 6 (six) hours as needed for moderate pain or severe pain.    [provider]  pantoprazole (PROTONIX) 40 MG tablet Take 1 tablet (40 mg total) by mouth daily. 02/03/17   Theodoro Grist, MD  Polyvinyl Alcohol-Povidone (REFRESH OP) Place 1 drop into both eyes 2 (two) times daily.    [provider]  sodium bicarbonate 650 MG tablet Take 650 mg by mouth 2 (two) times daily.  [provider]  tamsulosin (FLOMAX) 0.4 MG CAPS capsule Take 1 capsule (0.4 mg total) by mouth daily after supper. 02/19/17   Epifanio Lesches, MD  Vitamin D, Cholecalciferol, 1000 UNITS TABS Take 1 tablet by mouth daily.     [provider]    Allergies Patient has no known allergies.  Family History  Problem Relation Age of Onset  . Hypertension Mother   . Diabetes Neg Hx     Social History Social History   Tobacco Use  . Smoking status: Never Smoker  . Smokeless tobacco: Never Used  Vaping Use  . Vaping Use: Never used  Substance Use Topics  . Alcohol use: No    Comment: Drank in the past, but has stopped for several years.   . Drug use: No    Review of Systems  Review of Systems  Unable to perform ROS: Mental status change       ____________________________________________   PHYSICAL EXAM:  VITAL SIGNS: ED Triage Vitals [07/18/20 1159]  Enc Vitals Group     BP      Pulse      Resp      Temp      Temp src      SpO2      Weight 175 lb (79.4 kg)     Height 5\' 6"  (1.676 m)     Head Circumference      Peak Flow      Pain Score      Pain Loc      Pain Edu?      Excl. in [place]?    Vitals:   07/18/20 1200  BP: (!) 162/73  Pulse: 68  Resp: 16  Temp: 98.7 F (37.1 C)  SpO2: 96%   Physical Exam Vitals and nursing note reviewed.  Constitutional:      Appearance: He is ill-appearing.  HENT:     Head: Normocephalic and atraumatic.     Right Ear: External ear normal.     Left Ear: External ear normal.     Nose: Nose normal.  Eyes:     Conjunctiva/sclera: Conjunctivae normal.  Cardiovascular:     Rate and Rhythm: Normal rate and regular rhythm.     Pulses: Normal pulses.  Pulmonary:     Effort: Pulmonary effort is normal. No respiratory distress.     Breath sounds: No wheezing or rales.  Abdominal:     General: There is no distension.     Tenderness: There is no abdominal tenderness.  Genitourinary:    Testes: Normal.  Musculoskeletal:     Right lower leg: No edema.     Left lower leg: No edema.  Neurological:     Mental Status: He is lethargic and disoriented.     Comments: Patient moves all extremities spontaneously but does not participate in formal neuro exam.  PERRLA.  EOMI.      ____________________________________________   LABS (all labs ordered are listed, but only abnormal results are displayed)  Labs Reviewed  CBC WITH DIFFERENTIAL/PLATELET - Abnormal; Notable for the following components:      Result Value   RBC 3.97 (*)    HCT 38.2 (*)    All other components within normal limits  COMPREHENSIVE METABOLIC PANEL - Abnormal; Notable for the following components:   Chloride 96 (*)    Glucose, Bld 163 (*)    BUN 46 (*)    Creatinine, Ser 4.58 (*)    Calcium 8.7 (*)  Albumin 3.3 (*)    Alkaline Phosphatase 212 (*)    Total Bilirubin 1.9 (*)    GFR calc non Af Amer 11 (*)    GFR calc Af Amer 13 (*)    All other components within normal limits  AMMONIA - Abnormal; Notable for the following components:   Ammonia 61 (*)    All other components within normal limits  BLOOD GAS, VENOUS - Abnormal; Notable for the following components:   pH, Ven 7.52 (*)    pCO2, Ven 41 (*)    pO2, Ven 95.0 (*)    Bicarbonate 33.5 (*)    Acid-Base Excess 9.7 (*)    All other components within normal limits  URINALYSIS, COMPLETE (UACMP) WITH MICROSCOPIC - Abnormal; Notable for the following components:   Color, Urine YELLOW (*)    APPearance CLEAR (*)    Glucose, UA 50 (*)    Protein, ur >=300 (*)    All other components within normal limits  BILIRUBIN, DIRECT - Abnormal; Notable for the following components:   Bilirubin, Direct 0.5 (*)    All other components within normal limits  SARS CORONAVIRUS 2 BY RT PCR (HOSPITAL ORDER, [place] [facility])  URINE DRUG SCREEN, QUALITATIVE ([facility] ONLY)  TSH  MAGNESIUM  LACTIC ACID, PLASMA  LIPASE, BLOOD  ETHANOL  LACTIC ACID, PLASMA   ____________________________________________  EKG  Sinus rhythm with a first-degree block, prolonged QTc interval at 553, right bundle branch block, left anterior fascicle block, ST changes in the anterior leads which are present on prior ECGs no other clear evidence of acute ischemia or other significant on return. ____________________________________________  RADIOLOGY   Official radiology report(s): DG Chest 1 View  Result Date: 07/18/2020 CLINICAL DATA:  Altered mental status. EXAM: CHEST  1 VIEW COMPARISON:  October 12, 2018. FINDINGS: The heart size and mediastinal contours are within normal limits. Both lungs are clear. The visualized skeletal structures are unremarkable. IMPRESSION: No active disease. Electronically Signed   By: Marijo Conception M.D.   On:  07/18/2020 12:39   CT Head Wo Contrast  Result Date: 07/18/2020 CLINICAL DATA:  Altered mental status EXAM: CT HEAD WITHOUT CONTRAST TECHNIQUE: Contiguous axial images were obtained from the base of the skull through the vertex without intravenous contrast. COMPARISON:  03/29/2017 FINDINGS: Brain: No evidence of acute infarction, hemorrhage, hydrocephalus, extra-axial collection or mass lesion/mass effect. Mild atrophic and ischemic changes are again identified and stable. Vascular: No hyperdense vessel or unexpected calcification. Skull: Normal. Negative for fracture or focal lesion. Sinuses/Orbits: No acute finding. Other: None. IMPRESSION: Chronic atrophic and ischemic changes without acute abnormality. Electronically Signed   By: Inez Catalina M.D.   On: 07/18/2020 13:26    ____________________________________________   PROCEDURES  Procedure(s) performed (including Critical Care):  .1-3 Lead EKG Interpretation Performed by: Lucrezia Starch, MD Authorized by: Lucrezia Starch, MD     ECG rate assessment: normal     Rhythm: A-V block     Ectopy: none     Conduction: abnormal     Abnormal conduction: 1st degree AV block       ____________________________________________   INITIAL IMPRESSION / ASSESSMENT AND PLAN / ED COURSE        Overall patient's history, exam, and work-up is concerning for encephalopathy possibly related to elevated ammonia without other clear etiology.  No focal findings on exam or CT head findings to suggest CVA.  Patient does not appear septic given no obvious source  of infection on exam, fever, or elevation of white blood cell count.  In addition UA does not appear infected and there is evidence of pneumonia on chest x-ray.  I have low suspicion for toxic ingestion and UDS is negative.  Overall presentation and TSH is not consistent with significant thyroid derangement and aside from elevated creatinine and bilirubin noted on CMP there are no other  significant metabolic derangements noted.  His creatinine is consistent with patient's history of CKD.   Medications - No data to display      ____________________________________________   FINAL CLINICAL IMPRESSION(S) / ED DIAGNOSES  Final diagnoses:  Bilirubinemia  Altered mental status, unspecified altered mental status type  QT prolongation  First degree AV block  ESRD (end stage renal disease) ([name])  Vomiting, intractability of vomiting not specified, presence of nausea not specified, unspecified vomiting type  Increased ammonia level     ED Discharge Orders    None       Note:  This document was prepared using Dragon voice recognition software and may include unintentional dictation errors.   Lucrezia Starch, MD 07/18/20 [phone]

## 2020-07-18 NOTE — Progress Notes (Signed)
Called interpreter Wylene Simmer [phone] to communicate with patient. He was not coherent enough to answer questions; could not continue. Will call family member to get info.

## 2020-07-18 NOTE — H&P (Signed)
Martin Gilbert is an 79 y.o. male.   Chief Complaint: Altered mental status.   HPI:  Patient speaks Spanish only, attempt to use Spanish interpreter to obtain medical history.  Patient could not answer any questions.  History instead was obtained from his daughter. 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 difficile, type 2 diabetes, essential hypertension and dyslipidemia.  Patient currently lives at home with his wife, he was independent of all ADLs.  He went to dialysis today, completed the whole session, he was sent over from dialysis unit to the emergency room due to significant confusion.\  Per patient daughter, patient has significant nausea and vomiting for a few months.  But not normally has diarrhea.  On Saturday, patient was complaining significant headache, no dizziness.  No fever or chills.  But he was not confused on Saturday.  In the emergency room, a CT head was obtained, did not show any acute changes.  He was a found to have ammonia level of 61.  Chest x-ray did not show any acute changes, ultrasound of the right upper quadrant did not show any cholecystitis.  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  He is admitted to the hospital for acute metabolic encephalopathy.  Past Medical History:  Diagnosis Date  . Anemia   . Arthritis   . Back pain   . BPH (benign prostatic hyperplasia)   . CKD (chronic kidney disease), stage IV ([place])   . Clostridium difficile colitis   . Diabetes mellitus without complication ([place])   . GERD (gastroesophageal reflux disease)   . HTN (hypertension)   . Hyperlipidemia     Past Surgical History:  Procedure Laterality Date  . A/V FISTULAGRAM Left 04/20/2018   Procedure: A/V FISTULAGRAM;  Surgeon: Algernon Huxley, MD;  Location: [facility];  Service: Cardiovascular;  Laterality: Left;  . A/V FISTULAGRAM Left 11/25/2018   Procedure:  A/V FISTULAGRAM;  Surgeon: Algernon Huxley, MD;  Location: [facility];  Service: Cardiovascular;  Laterality: Left;  . A/V FISTULAGRAM Left 01/11/2019   Procedure: A/V FISTULAGRAM;  Surgeon: Algernon Huxley, MD;  Location: [facility];  Service: Cardiovascular;  Laterality: Left;  . AV FISTULA PLACEMENT Left 06/13/2017   Procedure: ARTERIOVENOUS (AV) FISTULA CREATION ( RADIAL CEPHALIC );  Surgeon: Katha Cabal, MD;  Location: [facility];  Service: Vascular;  Laterality: Left;  . CATARACT EXTRACTION     one eye not sure which  . DIALYSIS/PERMA CATHETER INSERTION N/A 04/03/2017   Procedure: Dialysis/Perma Catheter Insertion;  Surgeon: Algernon Huxley, MD;  Location: [facility];  Service: Cardiovascular;  Laterality: N/A;  . DIALYSIS/PERMA CATHETER INSERTION N/A 08/06/2017   Procedure: DIALYSIS/PERMA CATHETER INSERTION;  Surgeon: Algernon Huxley, MD;  Location: [facility];  Service: Cardiovascular;  Laterality: N/A;  . DIALYSIS/PERMA CATHETER REMOVAL N/A 08/04/2017   Procedure: DIALYSIS/PERMA CATHETER REMOVAL;  Surgeon: Algernon Huxley, MD;  Location: [facility];  Service: Cardiovascular;  Laterality: N/A;  . DIALYSIS/PERMA CATHETER REMOVAL N/A 12/31/2017   Procedure: DIALYSIS/PERMA CATHETER REMOVAL;  Surgeon: Algernon Huxley, MD;  Location: [facility];  Service: Cardiovascular;  Laterality: N/A;  . hernia repair    . SACROPLASTY N/A 09/30/2018   Procedure: SACROPLASTY;  Surgeon: Hessie Knows, MD;  Location: [facility];  Service: Orthopedics;  Laterality: N/A;  . UPPER EXTREMITY ANGIOGRAPHY Left 09/05/2017  Procedure: Upper Extremity Angiography;  Surgeon: Katha Cabal, MD;  Location: [facility];  Service: Cardiovascular;  Laterality: Left;    Family History  Problem Relation Age of Onset  . Hypertension Mother   . Diabetes Neg Hx    Social History:  reports that he has never smoked. He has never used smokeless tobacco. He reports that he  does not drink alcohol and does not use drugs.  Allergies: No Known Allergies  (Not in a hospital admission)  Review of system. Attempted review of system with Spanish interpreter, patient was not able to answer any questions.   Results for orders placed or performed during the hospital encounter of 07/18/20 (from the past 48 hour(s))  CBC with Differential     Status: Abnormal   Collection Time: 07/18/20 12:07 PM  Result Value Ref Range   WBC 5.3 4.0 - 10.5 K/uL   RBC 3.97 (L) 4.22 - 5.81 MIL/uL   Hemoglobin 13.1 13.0 - 17.0 g/dL   HCT 38.2 (L) 39 - 52 %   MCV 96.2 80.0 - 100.0 fL   MCH 33.0 26.0 - 34.0 pg   MCHC 34.3 30.0 - 36.0 g/dL   RDW 14.3 11.5 - 15.5 %   Platelets 200 150 - 400 K/uL   nRBC 0.0 0.0 - 0.2 %   Neutrophils Relative % 66 %   Neutro Abs 3.5 1.7 - 7.7 K/uL   Lymphocytes Relative 22 %   Lymphs Abs 1.2 0.7 - 4.0 K/uL   Monocytes Relative 10 %   Monocytes Absolute 0.5 0 - 1 K/uL   Eosinophils Relative 1 %   Eosinophils Absolute 0.1 0 - 0 K/uL   Basophils Relative 1 %   Basophils Absolute 0.0 0 - 0 K/uL   Immature Granulocytes 0 %   Abs Immature Granulocytes 0.01 0.00 - 0.07 K/uL    Comment: Performed at [facility], [address] [place], [place] [address]  Comprehensive metabolic panel     Status: Abnormal   Collection Time: 07/18/20 12:07 PM  Result Value Ref Range   Sodium 137 135 - 145 mmol/L   Potassium 3.9 3.5 - 5.1 mmol/L    Comment: HEMOLYSIS AT THIS LEVEL MAY AFFECT RESULT   Chloride 96 (L) 98 - 111 mmol/L   CO2 28 22 - 32 mmol/L   Glucose, Bld 163 (H) 70 - 99 mg/dL    Comment: Glucose reference range applies only to samples taken after fasting for at least 8 hours.   BUN 46 (H) 8 - 23 mg/dL   Creatinine, Ser 4.58 (H) 0.61 - 1.24 mg/dL   Calcium 8.7 (L) 8.9 - 10.3 mg/dL   Total Protein 7.2 6.5 - 8.1 g/dL   Albumin 3.3 (L) 3.5 - 5.0 g/dL   AST 40 15 - 41 U/L   ALT 26 0 - 44 U/L   Alkaline Phosphatase 212 (H) 38 - 126 U/L    Total Bilirubin 1.9 (H) 0.3 - 1.2 mg/dL   GFR calc non Af Amer 11 (L) >60 mL/min   GFR calc Af Amer 13 (L) >60 mL/min   Anion gap 13 5 - 15    Comment: Performed at [facility], [address] [place], [place] [address]  TSH     Status: None   Collection Time: 07/18/20 12:07 PM  Result Value Ref Range   TSH 1.791 0.350 - 4.500 uIU/mL    Comment: Performed by a 3rd Generation assay with a functional  sensitivity of <=0.01 uIU/mL. Performed at [facility], [address] [place], [place] [address]   Magnesium     Status: None   Collection Time: 07/18/20 12:07 PM  Result Value Ref Range   Magnesium 2.0 1.7 - 2.4 mg/dL    Comment: Performed at [facility], [address] [place], [place] [address]  Lipase, blood     Status: None   Collection Time: 07/18/20 12:07 PM  Result Value Ref Range   Lipase 27 11 - 51 U/L    Comment: Performed at [facility], [address] [place], [place] [address]  Bilirubin, direct     Status: Abnormal   Collection Time: 07/18/20 12:07 PM  Result Value Ref Range   Bilirubin, Direct 0.5 (H) 0.0 - 0.2 mg/dL    Comment: Performed at [facility], [address] [place], [place] [address]  Ethanol     Status: None   Collection Time: 07/18/20 12:07 PM  Result Value Ref Range   Alcohol, Ethyl (B) <10 <10 mg/dL    Comment: (NOTE) Lowest detectable limit for serum alcohol is 10 mg/dL.  For medical purposes only. Performed at [facility], [address] [place], [place] [address]   Ammonia     Status: Abnormal   Collection Time: 07/18/20 12:29 PM  Result Value Ref Range   Ammonia 61 (H) 9 - 35 umol/L    Comment: Performed at [facility], [address] [place], [place] [address]  Blood gas, venous     Status: Abnormal   Collection Time: 07/18/20 12:29 PM  Result Value Ref Range   pH, Ven 7.52 (H) 7.25 - 7.43   pCO2, Ven 41 (L) 44 - 60 mmHg   pO2, Ven 95.0 (H) 32 - 45 mmHg    Bicarbonate 33.5 (H) 20.0 - 28.0 mmol/L   Acid-Base Excess 9.7 (H) 0.0 - 2.0 mmol/L   O2 Saturation 98.1 %   Patient temperature 37.0    Collection site VEIN    Sample type VEIN     Comment: Performed at [facility], [address] [place], [place] [address]  SARS Coronavirus 2 by RT PCR (hospital order, performed in [place] hospital lab) Nasopharyngeal     Status: None   Collection Time: 07/18/20 12:29 PM   Specimen: Nasopharyngeal  Result Value Ref Range   SARS Coronavirus 2 NEGATIVE NEGATIVE    Comment: (NOTE) SARS-CoV-2 target nucleic acids are NOT DETECTED.  The SARS-CoV-2 RNA is generally detectable in upper and lower respiratory specimens during the acute phase of infection. The lowest concentration of SARS-CoV-2 viral copies this assay can detect is 250 copies / mL. A negative result does not preclude SARS-CoV-2 infection and should not be used as the sole basis for treatment or other patient management decisions.  A negative result may occur with improper specimen collection / handling, submission of specimen other than nasopharyngeal swab, presence of viral mutation(s) within the areas targeted by this assay, and inadequate number of viral copies (<250 copies / mL). A negative result must be combined with clinical observations, patient history, and epidemiological information.  Fact Sheet for Patients:   StrictlyIdeas.no  Fact Sheet for Healthcare Providers: BankingDealers.co.za  This test is not yet approved or  cleared by the [place] FDA and has been authorized for detection and/or diagnosis of SARS-CoV-2 by FDA under an Emergency Use Authorization (EUA).  This EUA will remain in effect (meaning this test can be used)  for the duration of the COVID-19 declaration under Section 564(b)(1) of the Act, 21 U.S.C. section 360bbb-3(b)(1), unless the authorization is terminated or revoked  sooner.  Performed at [facility], [address], [place], [place] [address]   Urine Drug Screen, Qualitative [facility] only)     Status: None   Collection Time: 07/18/20 12:29 PM  Result Value Ref Range   Tricyclic, Ur Screen NONE DETECTED NONE DETECTED   Amphetamines, Ur Screen NONE DETECTED NONE DETECTED   MDMA (Ecstasy)Ur Screen NONE DETECTED NONE DETECTED   Cocaine Metabolite,Ur [place] NONE DETECTED NONE DETECTED   Opiate, Ur Screen NONE DETECTED NONE DETECTED   Phencyclidine (PCP) Ur S NONE DETECTED NONE DETECTED   Cannabinoid 50 Ng, Ur [place] NONE DETECTED NONE DETECTED   Barbiturates, Ur Screen NONE DETECTED NONE DETECTED   Benzodiazepine, Ur Scrn NONE DETECTED NONE DETECTED   Methadone Scn, Ur NONE DETECTED NONE DETECTED    Comment: (NOTE) Tricyclics + metabolites, urine    Cutoff 1000 ng/mL Amphetamines + metabolites, urine  Cutoff 1000 ng/mL MDMA (Ecstasy), urine              Cutoff 500 ng/mL Cocaine Metabolite, urine          Cutoff 300 ng/mL Opiate + metabolites, urine        Cutoff 300 ng/mL Phencyclidine (PCP), urine         Cutoff 25 ng/mL Cannabinoid, urine                 Cutoff 50 ng/mL Barbiturates + metabolites, urine  Cutoff 200 ng/mL Benzodiazepine, urine              Cutoff 200 ng/mL Methadone, urine                   Cutoff 300 ng/mL  The urine drug screen provides only a preliminary, unconfirmed analytical test result and should not be used for non-medical purposes. Clinical consideration and professional judgment should be applied to any positive drug screen result due to possible interfering substances. A more specific alternate chemical method must be used in order to obtain a confirmed analytical result. Gas chromatography / mass spectrometry (GC/MS) is the preferred confirm atory method. Performed at [facility], [address] [place], [place] [address]   Urinalysis, Complete w Microscopic     Status: Abnormal   Collection Time:  07/18/20 12:29 PM  Result Value Ref Range   Color, Urine YELLOW (A) YELLOW   APPearance CLEAR (A) CLEAR   Specific Gravity, Urine 1.014 1.005 - 1.030   pH 8.0 5.0 - 8.0   Glucose, UA 50 (A) NEGATIVE mg/dL   Hgb urine dipstick NEGATIVE NEGATIVE   Bilirubin Urine NEGATIVE NEGATIVE   Ketones, ur NEGATIVE NEGATIVE mg/dL   Protein, ur >=300 (A) NEGATIVE mg/dL   Nitrite NEGATIVE NEGATIVE   Leukocytes,Ua NEGATIVE NEGATIVE   RBC / HPF 6-10 0 - 5 RBC/hpf   WBC, UA 0-5 0 - 5 WBC/hpf   Bacteria, UA NONE SEEN NONE SEEN   Squamous Epithelial / LPF NONE SEEN 0 - 5    Comment: Performed at [facility], [address] [place], [place] [address]  Lactic acid, plasma     Status: None   Collection Time: 07/18/20 12:29 PM  Result Value Ref Range   Lactic Acid, Venous 1.8 0.5 - 1.9 mmol/L    Comment: Performed at [facility], [address], [place], [place] [address]   DG Chest  1 View  Result Date: 07/18/2020 CLINICAL DATA:  Altered mental status. EXAM: CHEST  1 VIEW COMPARISON:  October 12, 2018. FINDINGS: The heart size and mediastinal contours are within normal limits. Both lungs are clear. The visualized skeletal structures are unremarkable. IMPRESSION: No active disease. Electronically Signed   By: Marijo Conception M.D.   On: 07/18/2020 12:39   CT Head Wo Contrast  Result Date: 07/18/2020 CLINICAL DATA:  Altered mental status EXAM: CT HEAD WITHOUT CONTRAST TECHNIQUE: Contiguous axial images were obtained from the base of the skull through the vertex without intravenous contrast. COMPARISON:  03/29/2017 FINDINGS: Brain: No evidence of acute infarction, hemorrhage, hydrocephalus, extra-axial collection or mass lesion/mass effect. Mild atrophic and ischemic changes are again identified and stable. Vascular: No hyperdense vessel or unexpected calcification. Skull: Normal. Negative for fracture or focal lesion. Sinuses/Orbits: No acute finding. Other: None. IMPRESSION: Chronic atrophic  and ischemic changes without acute abnormality. Electronically Signed   By: Inez Catalina M.D.   On: 07/18/2020 13:26   US Abdomen Limited RUQ  Result Date: 07/18/2020 CLINICAL DATA:  Elevated bilirubin EXAM: ULTRASOUND ABDOMEN LIMITED RIGHT UPPER QUADRANT COMPARISON:  None. FINDINGS: Gallbladder: No gallstones or wall thickening visualized. No sonographic Murphy sign noted by sonographer. Common bile duct: Diameter: 2.9 mm Liver: Liver is increased in echogenicity with nodularity consistent with underlying cirrhosis. No focal mass is noted. Portal vein is patent on color Doppler imaging with normal direction of blood flow towards the liver. Other: None. IMPRESSION: Cirrhotic change of the liver. No other focal abnormality is noted. Electronically Signed   By: Inez Catalina M.D.   On: 07/18/2020 14:42    Review of Systems  Blood pressure (!) 181/63, pulse 66, temperature 98.7 F (37.1 C), resp. rate 10, height 5\' 6"  (1.676 m), weight 79.4 kg, SpO2 100 %. Physical Exam Constitutional:      Appearance: He is ill-appearing. He is not toxic-appearing or diaphoretic.     Comments: Patient opens eye only, not answering questions.  HENT:     Head: Normocephalic and atraumatic.     Nose: Nose normal. No congestion.     Mouth/Throat:     Mouth: Mucous membranes are moist.     Pharynx: Oropharynx is clear.  Eyes:     General: No scleral icterus.    Extraocular Movements: Extraocular movements intact.     Conjunctiva/sclera: Conjunctivae normal.     Pupils: Pupils are equal, round, and reactive to light.  Cardiovascular:     Rate and Rhythm: Normal rate and regular rhythm.     Heart sounds: No murmur heard.  No friction rub. No gallop.   Pulmonary:     Effort: Pulmonary effort is normal. No respiratory distress.     Breath sounds: Normal breath sounds. No wheezing or rales.  Abdominal:     General: Abdomen is flat. Bowel sounds are normal. There is no distension.     Palpations: Abdomen is soft.      Tenderness: There is no abdominal tenderness.  Musculoskeletal:        General: Normal range of motion.     Cervical back: Normal range of motion and neck supple. No rigidity or tenderness.     Right lower leg: No edema.     Left lower leg: No edema.  Lymphadenopathy:     Cervical: No cervical adenopathy.  Skin:    General: Skin is warm and dry.     Coloration: Skin is not jaundiced.  Neurological:  General: No focal deficit present.     Mental Status: He is disoriented.     Cranial Nerves: No cranial nerve deficit.      Assessment/Plan #1.  Acute metabolic encephalopathy. Etiology still unclear.  He has evidence of liver cirrhosis, ammonia level elevated.  Most likely hepatic encephalopathy.  We will start lactulose and rifaximin. We will monitor patient overnight, if mental status does not improve, we may need to obtain MRI of the brain.  #2.  Nausea vomiting. 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  Currently, patient is too sleepy for additional testing.  May need to schedule gastric empty study when patient mental status improved.  3.  Type 2 diabetes with end stage renal disease. Start sliding scale insulin.  4.  End-stage renal disease on dialysis. Patient just dialyzed today, does not really need dialysis until Thursday.  May consult nephrology tomorrow.  5. Essential hypertension. Continue some home medicines.  6.  Prophylaxis. We will use heparin.  Patient probably will stay in hospital more than 2 days, will register as inpatient.  Sharen Hones, MD 07/18/2020, 3:26 PM

## 2020-07-18 NOTE — ED Notes (Signed)
Pt received dialysis today and facility called family to report pt was weak and dehydrated.

## 2020-07-18 NOTE — ED Notes (Signed)
Pt family member Angie reports she is leaving but can be contacted for questions or updates at [phone]

## 2020-07-18 NOTE — ED Triage Notes (Signed)
Patient is spanish speaking only. Daughter with patient. Patients daughter reports for the last several days patient has been weak and unable to ambulate around on his own like normal and unable to answer questions today. Did receive/finish dialysis treatment today and then sent to ER. Dialysis Tues,Thurs,Saturday

## 2020-07-19 DIAGNOSIS — E1165 Type 2 diabetes mellitus with hyperglycemia: Secondary | ICD-10-CM

## 2020-07-19 DIAGNOSIS — K921 Melena: Secondary | ICD-10-CM

## 2020-07-19 DIAGNOSIS — R5381 Other malaise: Secondary | ICD-10-CM

## 2020-07-19 DIAGNOSIS — K7469 Other cirrhosis of liver: Secondary | ICD-10-CM

## 2020-07-19 DIAGNOSIS — Z992 Dependence on renal dialysis: Secondary | ICD-10-CM

## 2020-07-19 DIAGNOSIS — K7200 Acute and subacute hepatic failure without coma: Secondary | ICD-10-CM

## 2020-07-19 DIAGNOSIS — G9341 Metabolic encephalopathy: Secondary | ICD-10-CM

## 2020-07-19 DIAGNOSIS — I10 Essential (primary) hypertension: Secondary | ICD-10-CM

## 2020-07-19 LAB — HEPATITIS PANEL, ACUTE
HCV Ab: NONREACTIVE
Hep A IgM: NONREACTIVE
Hep B C IgM: NONREACTIVE
Hepatitis B Surface Ag: NONREACTIVE

## 2020-07-19 LAB — HIV ANTIBODY (ROUTINE TESTING W REFLEX): HIV Screen 4th Generation wRfx: NONREACTIVE

## 2020-07-19 LAB — GLUCOSE, CAPILLARY
Glucose-Capillary: 89 mg/dL (ref 70–99)
Glucose-Capillary: 127 mg/dL — ABNORMAL HIGH (ref 70–99)
Glucose-Capillary: 121 mg/dL — ABNORMAL HIGH (ref 70–99)
Glucose-Capillary: 125 mg/dL — ABNORMAL HIGH (ref 70–99)

## 2020-07-19 LAB — HEMOGLOBIN AND HEMATOCRIT, BLOOD
HCT: 36.7 % — ABNORMAL LOW (ref 39.0–52.0)
Hemoglobin: 12.2 g/dL — ABNORMAL LOW (ref 13.0–17.0)

## 2020-07-19 LAB — BASIC METABOLIC PANEL
Anion gap: 14 (ref 5–15)
BUN: 59 mg/dL — ABNORMAL HIGH (ref 8–23)
CO2: 26 mmol/L (ref 22–32)
Calcium: 8.3 mg/dL — ABNORMAL LOW (ref 8.9–10.3)
Chloride: 100 mmol/L (ref 98–111)
Creatinine, Ser: 6.18 mg/dL — ABNORMAL HIGH (ref 0.61–1.24)
GFR calc Af Amer: 9 mL/min — ABNORMAL LOW (ref >60)
GFR calc non Af Amer: 8 mL/min — ABNORMAL LOW (ref >60)
Glucose, Bld: 95 mg/dL (ref 70–99)
Potassium: 4.3 mmol/L (ref 3.5–5.1)
Sodium: 140 mmol/L (ref 135–145)

## 2020-07-19 LAB — PROTIME-INR
INR: 1.1 (ref 0.8–1.2)
Prothrombin Time: 13.5 seconds (ref 11.4–15.2)

## 2020-07-19 LAB — CBC
HCT: 34.2 % — ABNORMAL LOW (ref 39.0–52.0)
Hemoglobin: 12 g/dL — ABNORMAL LOW (ref 13.0–17.0)
MCH: 33.2 pg (ref 26.0–34.0)
MCHC: 35.1 g/dL (ref 30.0–36.0)
MCV: 94.7 fL (ref 80.0–100.0)
Platelets: 175 10*3/uL (ref 150–400)
RBC: 3.61 MIL/uL — ABNORMAL LOW (ref 4.22–5.81)
RDW: 14.2 % (ref 11.5–15.5)
WBC: 4.3 10*3/uL (ref 4.0–10.5)
nRBC: 0 % (ref 0.0–0.2)

## 2020-07-19 LAB — TSH: TSH: 1.335 u[IU]/mL (ref 0.350–4.500)

## 2020-07-19 LAB — APTT: aPTT: 39 seconds — ABNORMAL HIGH (ref 24–36)

## 2020-07-19 LAB — AMMONIA: Ammonia: 63 umol/L — ABNORMAL HIGH (ref 9–35)

## 2020-07-19 LAB — VITAMIN B12: Vitamin B-12: 230 pg/mL (ref 180–914)

## 2020-07-19 MED ORDER — CHLORHEXIDINE GLUCONATE CLOTH 2 % EX PADS
6.0000 | MEDICATED_PAD | Freq: Every day | CUTANEOUS | Status: DC
  Administered 2020-07-19 – 2020-07-21 (×3): 6 via TOPICAL

## 2020-07-19 NOTE — Progress Notes (Signed)
PROGRESS NOTE  Martin Gilbert UMP:[id number] DOB: 06/26/1941   PCP: [name], MD  Patient is from: Home.  Reportedly independent with ADLs  DOA: 07/18/2020 LOS: 1  Brief Narrative / Interim history: 79 year old Spanish-speaking male with history of ESRD on HD TTS, CHF, chronic back pain, DM-2, HTN and HLD sent to ED from dialysis center with altered mental status.  Per family, had nausea and vomiting for few months prior to arrival.  Also had headache about 3 days prior to admission.  In ED, CTh negative for acute finding.  Ammonia 61.  CXR without acute finding.  RUQ [place] evidence of liver cirrhosis but no cholecystitis.  Admitted for acute metabolic encephalopathy/possible hepatic encephalopathy.  Subjective: Seen and examined earlier this morning.  Per RN, patient has had 2 BMs.  Second BM with fresh red blood. On my evaluation, patient is awake but only oriented to self.  Responds no to pain.  He says he feels "fine".  Follows some commands.  Objective: Vitals:   07/19/20 0500 07/19/20 0800 07/19/20 0810 07/19/20 1137  BP: (!) 130/54 (!) 147/50  (!) 164/56  Pulse: 74 67 70 66  Resp: 17  17 16   Temp: 98.4 F (36.9 C)  97.9 F (36.6 C) 98 F (36.7 C)  TempSrc: Axillary  Oral Oral  SpO2: 95%  96% 99%  Weight: 69.8 kg     Height:       No intake or output data in the 24 hours ending 07/19/20 1219 Filed Weights   07/18/20 1159 07/19/20 0500  Weight: 79.4 kg 69.8 kg    Examination:  GENERAL: Chronically ill-appearing.  No distress. HEENT: MMM.  Vision and hearing grossly intact.  NECK: Supple.  No apparent JVD.  RESP: On RA.  No IWOB.  Fair aeration bilaterally. CVS:  RRR. Heart sounds normal.  ABD/GI/GU: BS+. Abd soft, NTND.  MSK/EXT:  Moves extremities. No apparent deformity. No edema.  SKIN: no apparent skin lesion or wound NEURO: Awake and oriented to self.  Follows some commands.  No apparent focal neuro deficit but limited exam. PSYCH: Calm.  No  distress or agitation.  Procedures:  None  Microbiology summarized: COVID-19 PCR negative. C. difficile screen pending.  Assessment & Plan: Acute metabolic encephalopathy: Suspecting hepatic encephalopathy given history of liver cirrhosis and elevated ammonia.  No signs and symptoms of infectious process.  No signs of SBP.  No focal neuro deficit but limited exam due to mental status.  CTH negative for acute finding.  UDS negative. -Continue lactulose -TSH, B12, RPR...  Hematochezia: Probably had loose BM with fresh red blood this morning.  H&H was stable overnight. -Discontinue aspirin and subcu heparin -Coag labs and Hemoccult -Monitor H&H -May involve GI if H&H drops. -Continue ferrous sulfate  Liver cirrhosis: Etiology.  Hepatitis panel negative in 2018.  No signs of ascites or SBP.  Will be on medication for this. -Continue lactulose as above -Hepatitis panel and HIV.  Nausea and vomiting: Seems to have resolved.  Having BMs. -As needed antiemetics  DM-2 with ESRD: A1c 5.3%. Recent Labs  Lab 07/18/20 1627 07/18/20 1759 07/18/20 2142 07/19/20 0811 07/19/20 1137  GLUCAP 82 98 101* 89 127*  -Discontinue CBG monitoring and SSI.   ESRD on HD TTS: Completed full session on last HD yesterday.  No indication for emergent dialysis -We will notify nephrology  Essential hypertension: BP within fair range. -Continue metoprolol  Debility/physical deconditioning -PT/OT eval   Body mass index is 24.84 kg/m.  DVT prophylaxis:  Place and maintain sequential compression device Start: 07/19/20 1214 SCDs Start: 07/18/20 1525  Code Status: Full code Family Communication: None at bedside.  Will update family later. Status is: Inpatient  Remains inpatient appropriate because:Altered mental status and Inpatient level of care appropriate due to severity of illness and GI bleed   Dispo: The patient is from: Home              Anticipated d/c is to: To be  determined              Anticipated d/c date is: > 3 days              Patient currently is not medically stable to d/c.       Consultants:  Nephrology   Sch Meds:  Scheduled Meds: . allopurinol  100 mg Oral Daily  . atorvastatin  20 mg Oral Daily  . Chlorhexidine Gluconate Cloth  6 each Topical Daily  . ferrous sulfate  325 mg Oral BID WC  . hydrALAZINE  50 mg Oral TID  . lactulose  30 g Oral TID  . metoprolol succinate  25 mg Oral Daily  . pantoprazole  40 mg Oral Daily  . rifaximin  200 mg Oral BID  . sodium bicarbonate  650 mg Oral BID  . tamsulosin  0.4 mg Oral QPC supper   Continuous Infusions: PRN Meds:.acetaminophen  Antimicrobials: Anti-infectives (From admission, onward)   Start     Dose/Rate Route Frequency Ordered Stop   07/18/20 2200  rifaximin (XIFAXAN) tablet 200 mg     Discontinue     200 mg Oral 2 times daily 07/18/20 1540         I have personally reviewed the following labs and images: CBC: Recent Labs  Lab 07/18/20 1207 07/18/20 1622 07/19/20 0416  WBC 5.3 5.0 4.3  NEUTROABS 3.5  --   --   HGB 13.1 12.4* 12.0*  HCT 38.2* 37.0* 34.2*  MCV 96.2 97.6 94.7  PLT 200 193 175   BMP &GFR Recent Labs  Lab 07/18/20 1207 07/19/20 0416  NA 137 140  K 3.9 4.3  CL 96* 100  CO2 28 26  GLUCOSE 163* 95  BUN 46* 59*  CREATININE 4.58* 6.18*  CALCIUM 8.7* 8.3*  MG 2.0  --    Estimated Creatinine Clearance: 8.6 mL/min (A) (by C-G formula based on SCr of 6.18 mg/dL (H)). Liver & Pancreas: Recent Labs  Lab 07/18/20 1207  AST 40  ALT 26  ALKPHOS 212*  BILITOT 1.9*  PROT 7.2  ALBUMIN 3.3*   Recent Labs  Lab 07/18/20 1207  LIPASE 27   Recent Labs  Lab 07/18/20 1229 07/19/20 0416  AMMONIA 61* 63*   Diabetic: Recent Labs    07/18/20 1621  HGBA1C 5.3   Recent Labs  Lab 07/18/20 1627 07/18/20 1759 07/18/20 2142 07/19/20 0811 07/19/20 1137  GLUCAP 82 98 101* 89 127*   Cardiac Enzymes: No results for input(s): CKTOTAL,  CKMB, CKMBINDEX, TROPONINI in the last 168 hours. No results for input(s): PROBNP in the last 8760 hours. Coagulation Profile: No results for input(s): INR, PROTIME in the last 168 hours. Thyroid Function Tests: Recent Labs    07/18/20 1207  TSH 1.791   Lipid Profile: No results for input(s): CHOL, HDL, LDLCALC, TRIG, CHOLHDL, LDLDIRECT in the last 72 hours. Anemia Panel: No results for input(s): VITAMINB12, FOLATE, FERRITIN, TIBC, IRON, RETICCTPCT in the last 72 hours. Urine analysis:    Component  Value Date/Time   COLORURINE YELLOW (A) 07/18/2020 1229   APPEARANCEUR CLEAR (A) 07/18/2020 1229   APPEARANCEUR Turbid 08/29/2013 2049   LABSPEC 1.014 07/18/2020 1229   LABSPEC 1.015 08/29/2013 2049   PHURINE 8.0 07/18/2020 1229   GLUCOSEU 50 (A) 07/18/2020 1229   GLUCOSEU 50 mg/dL 08/29/2013 2049   HGBUR NEGATIVE 07/18/2020 [place] 07/18/2020 1229   BILIRUBINUR Negative 08/29/2013 2049   KETONESUR NEGATIVE 07/18/2020 1229   PROTEINUR >=300 (A) 07/18/2020 1229   NITRITE NEGATIVE 07/18/2020 1229   LEUKOCYTESUR NEGATIVE 07/18/2020 1229   LEUKOCYTESUR 3+ 08/29/2013 2049   Sepsis Labs: Invalid input(s): PROCALCITONIN, [place]  Microbiology: Recent Results (from the past 240 hour(s))  SARS Coronavirus 2 by RT PCR (hospital order, performed in [facility] hospital lab) Nasopharyngeal     Status: None   Collection Time: 07/18/20 12:29 PM   Specimen: Nasopharyngeal  Result Value Ref Range Status   SARS Coronavirus 2 NEGATIVE NEGATIVE Final    Comment: (NOTE) SARS-CoV-2 target nucleic acids are NOT DETECTED.  The SARS-CoV-2 RNA is generally detectable in upper and lower respiratory specimens during the acute phase of infection. The lowest concentration of SARS-CoV-2 viral copies this assay can detect is 250 copies / mL. A negative result does not preclude SARS-CoV-2 infection and should not be used as the sole basis for treatment or other patient  management decisions.  A negative result may occur with improper specimen collection / handling, submission of specimen other than nasopharyngeal swab, presence of viral mutation(s) within the areas targeted by this assay, and inadequate number of viral copies (<250 copies / mL). A negative result must be combined with clinical observations, patient history, and epidemiological information.  Fact Sheet for Patients:   StrictlyIdeas.no  Fact Sheet for Healthcare Providers: BankingDealers.co.za  This test is not yet approved or  cleared by the [place] FDA and has been authorized for detection and/or diagnosis of SARS-CoV-2 by FDA under an Emergency Use Authorization (EUA).  This EUA will remain in effect (meaning this test can be used) for the duration of the COVID-19 declaration under Section 564(b)(1) of the Act, 21 U.S.C. section 360bbb-3(b)(1), unless the authorization is terminated or revoked sooner.  Performed at [facility], [address], [place], [place] [address]     Radiology Studies: DG Chest 1 View  Result Date: 07/18/2020 CLINICAL DATA:  Altered mental status. EXAM: CHEST  1 VIEW COMPARISON:  October 12, 2018. FINDINGS: The heart size and mediastinal contours are within normal limits. Both lungs are clear. The visualized skeletal structures are unremarkable. IMPRESSION: No active disease. Electronically Signed   By: Marijo Conception M.D.   On: 07/18/2020 12:39   CT Head Wo Contrast  Result Date: 07/18/2020 CLINICAL DATA:  Altered mental status EXAM: CT HEAD WITHOUT CONTRAST TECHNIQUE: Contiguous axial images were obtained from the base of the skull through the vertex without intravenous contrast. COMPARISON:  03/29/2017 FINDINGS: Brain: No evidence of acute infarction, hemorrhage, hydrocephalus, extra-axial collection or mass lesion/mass effect. Mild atrophic and ischemic changes are again identified and stable.  Vascular: No hyperdense vessel or unexpected calcification. Skull: Normal. Negative for fracture or focal lesion. Sinuses/Orbits: No acute finding. Other: None. IMPRESSION: Chronic atrophic and ischemic changes without acute abnormality. Electronically Signed   By: Inez Catalina M.D.   On: 07/18/2020 13:26   US Abdomen Limited RUQ  Result Date: 07/18/2020 CLINICAL DATA:  Elevated bilirubin EXAM: ULTRASOUND ABDOMEN LIMITED RIGHT UPPER QUADRANT COMPARISON:  None. FINDINGS:  Gallbladder: No gallstones or wall thickening visualized. No sonographic Murphy sign noted by sonographer. Common bile duct: Diameter: 2.9 mm Liver: Liver is increased in echogenicity with nodularity consistent with underlying cirrhosis. No focal mass is noted. Portal vein is patent on color Doppler imaging with normal direction of blood flow towards the liver. Other: None. IMPRESSION: Cirrhotic change of the liver. No other focal abnormality is noted. Electronically Signed   By: Inez Catalina M.D.   On: 07/18/2020 14:42   45 minutes with more than 50% spent in reviewing records, counseling patient/family and coordinating care.   Briaunna Grindstaff T. Hall  If 7PM-7AM, please contact night-coverage [email or web address] Password [email or web address] 07/19/2020, 12:19 PM

## 2020-07-19 NOTE — Progress Notes (Signed)
Dr Cyndia Skeeters made aware that NT reports that pt had a medium loose BM with bright red blood in toilet and on paper, new orders placed for hemoccult and H&H

## 2020-07-19 NOTE — Plan of Care (Signed)
Care implemented

## 2020-07-19 NOTE — Progress Notes (Signed)
Attempted to call patient's daughter, Sharilyn Sites for update but no answer on generic voicemail.   Of note, H&H is stable.  Coag labs, vitamin B12 and TSH was normal.  We will recheck basic labs in the morning.

## 2020-07-19 NOTE — Progress Notes (Signed)
Called interpreter services for Spanish only speaking patient. Translator Wylene Simmer [phone] began introduction to pt. Patient not able to conduct interview due to level of illness; unable to answer simple questions (ex. Health history, current condition, etc). Phone call not completed at this time. Will try again when patient more lucid.

## 2020-07-19 NOTE — Progress Notes (Signed)
RN called patient's daughter Hillard Danker [phone]) to provide update on patient's condition. Informed her that patient remained stable during night time; care of plan from provider most likely today; will continue to give updates about POC.

## 2020-07-20 LAB — COMPREHENSIVE METABOLIC PANEL
ALT: 19 U/L (ref 0–44)
AST: 29 U/L (ref 15–41)
Albumin: 2.8 g/dL — ABNORMAL LOW (ref 3.5–5.0)
Alkaline Phosphatase: 170 U/L — ABNORMAL HIGH (ref 38–126)
Anion gap: 12 (ref 5–15)
BUN: 72 mg/dL — ABNORMAL HIGH (ref 8–23)
CO2: 27 mmol/L (ref 22–32)
Calcium: 8.3 mg/dL — ABNORMAL LOW (ref 8.9–10.3)
Chloride: 99 mmol/L (ref 98–111)
Creatinine, Ser: 8.2 mg/dL — ABNORMAL HIGH (ref 0.61–1.24)
GFR calc Af Amer: 6 mL/min — ABNORMAL LOW (ref >60)
GFR calc non Af Amer: 6 mL/min — ABNORMAL LOW (ref >60)
Glucose, Bld: 93 mg/dL (ref 70–99)
Potassium: 4.1 mmol/L (ref 3.5–5.1)
Sodium: 138 mmol/L (ref 135–145)
Total Bilirubin: 1.2 mg/dL (ref 0.3–1.2)
Total Protein: 5.9 g/dL — ABNORMAL LOW (ref 6.5–8.1)

## 2020-07-20 LAB — GLUCOSE, CAPILLARY: Glucose-Capillary: 96 mg/dL (ref 70–99)

## 2020-07-20 LAB — CBC
HCT: 33.9 % — ABNORMAL LOW (ref 39.0–52.0)
Hemoglobin: 11.6 g/dL — ABNORMAL LOW (ref 13.0–17.0)
MCH: 32.6 pg (ref 26.0–34.0)
MCHC: 34.2 g/dL (ref 30.0–36.0)
MCV: 95.2 fL (ref 80.0–100.0)
Platelets: 178 10*3/uL (ref 150–400)
RBC: 3.56 MIL/uL — ABNORMAL LOW (ref 4.22–5.81)
RDW: 14.1 % (ref 11.5–15.5)
WBC: 4 10*3/uL (ref 4.0–10.5)
nRBC: 0 % (ref 0.0–0.2)

## 2020-07-20 LAB — URINALYSIS, COMPLETE (UACMP) WITH MICROSCOPIC
Bacteria, UA: NONE SEEN
RBC / HPF: >50 RBC/hpf — ABNORMAL HIGH (ref 0–5)
Specific Gravity, Urine: 1.02 (ref 1.005–1.030)
Squamous Epithelial / HPF: NONE SEEN (ref 0–5)
WBC, UA: NONE SEEN WBC/hpf (ref 0–5)

## 2020-07-20 LAB — AMMONIA: Ammonia: 54 umol/L — ABNORMAL HIGH (ref 9–35)

## 2020-07-20 LAB — PHOSPHORUS: Phosphorus: 5 mg/dL — ABNORMAL HIGH (ref 2.5–4.6)

## 2020-07-20 LAB — MAGNESIUM: Magnesium: 1.9 mg/dL (ref 1.7–2.4)

## 2020-07-20 LAB — RPR: RPR Ser Ql: NONREACTIVE

## 2020-07-20 MED ORDER — QUETIAPINE FUMARATE 25 MG PO TABS
25.0000 mg | ORAL_TABLET | Freq: Every day | ORAL | Status: DC
  Administered 2020-07-20: 23:00:00 25 mg via ORAL
  Filled 2020-07-20: qty 1

## 2020-07-20 NOTE — Evaluation (Signed)
Occupational Therapy Evaluation Patient Details Name: Martin Gilbert MRN: [id number] DOB: November 02, 1941 Today's Date: 07/20/2020    History of Present Illness 79 year old Spanish-speaking male with history of ESRD on HD TTS, CHF, chronic back pain, DM-2, HTN and HLD sent to ED from dialysis center with altered mental status.  Per family, had nausea and vomiting for few months prior to arrival.  Also had headache about 3 days prior to admission. In ED, CTh negative for acute finding.  Ammonia 61.  CXR without acute finding.  RUQ [place] evidence of liver cirrhosis but no cholecystitis.  Admitted for acute metabolic encephalopathy/possible hepatic encephalopathy.   Clinical Impression   Patient presenting with decreased I in self care, balance, functional mobility/transfers, endurance, and cognition.Use of stratus interpreter and son who is present in room for patient information this session. Patient was mod I with use of rollator PTA. He lives with wife who is in poor health and new dialysis pt. Son is most concerned about pt's continued confusion this morning. Family will be available 24/7 to assist pt once returning home.  Patient currently functioning at min A overall with self care tasks and mobility. Patient will benefit from acute OT to increase overall independence in the areas of ADLs, functional mobility, and safety awareness in order to safely discharge home with caregivers.    Follow Up Recommendations  Home health OT;Supervision/Assistance - 24 hour    Equipment Recommendations  None recommended by OT    Recommendations for Other Services Other (comment) (none at this time)     Precautions / Restrictions Precautions Precautions: Fall      Mobility Bed Mobility Overal bed mobility: Needs Assistance Bed Mobility: Rolling;Supine to Sit;Sit to Supine Rolling: Supervision   Supine to sit: Supervision Sit to supine: Supervision   General bed mobility comments: min cuing for  safety  Transfers Overall transfer level: Needs assistance Equipment used: 1 person hand held assist Transfers: Sit to/from Stand Sit to Stand: Min assist         General transfer comment: min A for balance without use of AD    Balance Overall balance assessment: Needs assistance Sitting-balance support: Feet supported Sitting balance-Leahy Scale: Fair Sitting balance - Comments: CGA reaching towards to don/doff socks   Standing balance support: During functional activity Standing balance-Leahy Scale: Fair Standing balance comment: min A for balance without use of AD           ADL either performed or assessed with clinical judgement   ADL Overall ADL's : Needs assistance/impaired        General ADL Comments: Pt set up A to open container and cut food for breakfast. CGA when reaching forward to don/doff B socks. Pt standing with min A and side stepping up bed with min A. Sit >supine with close supervision.     Vision Baseline Vision/History: Wears glasses Wears Glasses: At all times Patient Visual Report: No change from baseline Vision Assessment?: No apparent visual deficits         Hand Dominance Right   Extremity/Trunk Assessment Upper Extremity Assessment Upper Extremity Assessment: Generalized weakness   Lower Extremity Assessment Lower Extremity Assessment: Defer to PT evaluation       Communication Communication Communication: Prefers language other than [place];Interpreter utilized;Other (comment) (son present)   Cognition Arousal/Alertness: Awake/alert Behavior During Therapy: Restless;Flat affect Overall Cognitive Status: Impaired/Different from baseline Area of Impairment: Orientation;Memory;Safety/judgement       Orientation Level: Disoriented to;Place;Time;Situation   Memory: Decreased short-term memory   Safety/Judgement:  Decreased awareness of safety;Decreased awareness of deficits     General Comments: Pt believes he is still at  dialysis clinic and gives DOB when asked orientation questions. Son present who reports pt with increased confusion as well.              Home Living Family/patient expects to be discharged to:: Private residence Living Arrangements: Spouse/significant other Available Help at Discharge: Family;Available 24 hours/day Type of Home: House Home Access: Level entry     Home Layout: One level     Bathroom Shower/Tub: Teacher, early years/pre: Standard Bathroom Accessibility: Yes How Accessible: Accessible via walker Home Equipment: [place] - 4 wheels;Bedside commode;Shower seat   Additional Comments: information gathered with stratus interpreter and son      Prior Functioning/Environment Level of Independence: Independent with assistive device(s)        Comments: uses rollator at home        OT Problem List: Decreased strength;Decreased cognition;Decreased activity tolerance;Decreased safety awareness;Impaired balance (sitting and/or standing)      OT Treatment/Interventions: Self-care/ADL training;Therapeutic exercise;Therapeutic activities;Cognitive remediation/compensation;Energy conservation;Patient/family education;Manual therapy;Balance training    OT Goals(Current goals can be found in the care plan section) Acute Rehab OT Goals Patient Stated Goal: return home and make better OT Goal Formulation: With family Time For Goal Achievement: 08/03/20 Potential to Achieve Goals: Good ADL Goals Pt Will Perform Grooming: with modified independence;standing Pt Will Perform Upper Body Dressing: with modified independence;standing Pt Will Perform Lower Body Dressing: with modified independence;sit to/from stand Pt Will Transfer to Toilet: with modified independence;ambulating Pt Will Perform Toileting - Clothing Manipulation and hygiene: with modified independence;sit to/from stand  OT Frequency: Min 2X/week   Barriers to D/C: Other (comment)  none known at this  time          AM-PAC OT "6 Clicks" Daily Activity     Outcome Measure Help from another person eating meals?: A Little Help from another person taking care of personal grooming?: A Little Help from another person toileting, which includes using toliet, bedpan, or urinal?: A Little Help from another person bathing (including washing, rinsing, drying)?: A Little Help from another person to put on and taking off regular upper body clothing?: A Little Help from another person to put on and taking off regular lower body clothing?: A Little 6 Click Score: 18   End of Session Nurse Communication: Mobility status;Precautions  Activity Tolerance: Patient tolerated treatment well Patient left: in bed;with call bell/phone within reach;with bed alarm set;with family/visitor present  OT Visit Diagnosis: Unsteadiness on feet (R26.81);Muscle weakness (generalized) (M62.81)                Time: 2952-8413 OT Time Calculation (min): 20 min Charges:  OT General Charges $OT Visit: 1 Visit OT Evaluation $OT Eval Low Complexity: 1 Low OT Treatments $Self Care/Home Management : 8-22 mins  Darleen Crocker, MS, OTR/L , CBIS ascom [phone]  07/20/20, 10:08 AM

## 2020-07-20 NOTE — Progress Notes (Signed)
Pt agitated wanting to know why he cant go to dialysis right away, explained that he had to wait for the doctor and wait his turn, pt tried to remove his IV wanting to go home, called his daughter [name] explained the situation and had her speak with the pt, states that pt just "doesn't listen" and that he is confused about the time of day, states that she is sending her brother to hospital to be with their dad

## 2020-07-20 NOTE — Progress Notes (Signed)
D/C enteric precautions/cdiff sample per Dr Cyndia Skeeters

## 2020-07-20 NOTE — Progress Notes (Signed)
Used interpreter video for communication between RN and patient. Martin Gilbert #[id number] answered and assisted with call. We were not able to complete call as she was unable to understand patient's speech at certain times during conversation. Patient's dialect may be contributing factor, as daughter states they speak '[place]' Interpreter verified that dialect is unavailable at this time.

## 2020-07-20 NOTE — Progress Notes (Signed)
Patient complained of stomach pain. RN wanted more info about type and intensity. Contacted interpreter services; Wilhemena Durie #[id number]. Interpreter was not able to fully understand patient's verbiage other than he c/o stomach pain. RN called patient's daughter to get more specific information. She stated that he described his pain as a feeling of wanting to have a bowel movement and wants Tylenol. See administration in MAR.

## 2020-07-20 NOTE — Progress Notes (Signed)
PT Cancellation Note  Patient Details Name: Martin Gilbert MRN: [id number] DOB: [birth year]   Cancelled Treatment:    Reason Eval/Treat Not Completed: Patient at procedure or test/unavailable (patient out of room at hemodialysis). PT to follow up as appropriate.   Minna Merritts, PT, MPT  Percell Locus 07/20/2020, 12:57 PM

## 2020-07-20 NOTE — Progress Notes (Signed)
[facility]  ROUNDING NOTE   Subjective:  Patient well-known to [place] as we follow him for outpatient hemodialysis. He is due for dialysis treatment today. He presented with altered mental status. Visit conducted today via use of online interpreter  Objective:  Vital signs in last 24 hours:  Temp:  [98.3 F (36.8 C)-98.6 F (37 C)] 98.6 F (37 C) (08/05 1200) Pulse Rate:  [65-74] 66 (08/05 1345) Resp:  [16-18] 18 (08/05 1129) BP: (155-190)/(60-84) 190/79 (08/05 1345) SpO2:  [94 %-97 %] 97 % (08/05 0744)  Weight change:  Filed Weights   07/18/20 1159 07/19/20 0500  Weight: 79.4 kg 69.8 kg    Intake/Output: No intake/output data recorded.   Intake/Output this shift:  Total I/O In: 120 [P.O.:120] Out: -   Physical Exam: General:  No acute distress  Head:  Normocephalic, atraumatic. Moist oral mucosal membranes  Eyes:  Anicteric  Neck:  Supple  Lungs:   Clear to auscultation, normal effort  Heart:  S1S2 no rubs  Abdomen:   Soft, nontender, bowel sounds present  Extremities:  No peripheral edema.  Neurologic:  Awake, alert, following commands  Skin:  No lesions  Access:  Left upper extremity AV fistula    Basic Metabolic Panel: Recent Labs  Lab 07/18/20 1207 07/19/20 0416 07/20/20 0501  NA 137 140 138  K 3.9 4.3 4.1  CL 96* 100 99  CO2 28 26 27   GLUCOSE 163* 95 93  BUN 46* 59* 72*  CREATININE 4.58* 6.18* 8.20*  CALCIUM 8.7* 8.3* 8.3*  MG 2.0  --  1.9  PHOS  --   --  5.0*    Liver Function Tests: Recent Labs  Lab 07/18/20 1207 07/20/20 0501  AST 40 29  ALT 26 19  ALKPHOS 212* 170*  BILITOT 1.9* 1.2  PROT 7.2 5.9*  ALBUMIN 3.3* 2.8*   Recent Labs  Lab 07/18/20 1207  LIPASE 27   Recent Labs  Lab 07/18/20 1229 07/19/20 0416 07/20/20 0501  AMMONIA 61* 63* 54*    CBC: Recent Labs  Lab 07/18/20 1207 07/18/20 1622 07/19/20 0416 07/19/20 1225 07/20/20 0501  WBC 5.3 5.0 4.3  --  4.0  NEUTROABS 3.5  --   --   --   --    HGB 13.1 12.4* 12.0* 12.2* 11.6*  HCT 38.2* 37.0* 34.2* 36.7* 33.9*  MCV 96.2 97.6 94.7  --  95.2  PLT 200 193 175  --  178    Cardiac Enzymes: No results for input(s): CKTOTAL, CKMB, CKMBINDEX, TROPONINI in the last 168 hours.  BNP: Invalid input(s): POCBNP  CBG: Recent Labs  Lab 07/19/20 0811 07/19/20 1137 07/19/20 1638 07/19/20 2033 07/20/20 0726  GLUCAP 89 127* 121* 125* 96    Microbiology: Results for orders placed or performed during the hospital encounter of 07/18/20  SARS Coronavirus 2 by RT PCR (hospital order, performed in [facility] hospital lab) Nasopharyngeal     Status: None   Collection Time: 07/18/20 12:29 PM   Specimen: Nasopharyngeal  Result Value Ref Range Status   SARS Coronavirus 2 NEGATIVE NEGATIVE Final    Comment: (NOTE) SARS-CoV-2 target nucleic acids are NOT DETECTED.  The SARS-CoV-2 RNA is generally detectable in upper and lower respiratory specimens during the acute phase of infection. The lowest concentration of SARS-CoV-2 viral copies this assay can detect is 250 copies / mL. A negative result does not preclude SARS-CoV-2 infection and should not be used as the sole basis for treatment or other patient  management decisions.  A negative result may occur with improper specimen collection / handling, submission of specimen other than nasopharyngeal swab, presence of viral mutation(s) within the areas targeted by this assay, and inadequate number of viral copies (<250 copies / mL). A negative result must be combined with clinical observations, patient history, and epidemiological information.  Fact Sheet for Patients:   StrictlyIdeas.no  Fact Sheet for Healthcare Providers: BankingDealers.co.za  This test is not yet approved or  cleared by the [place] FDA and has been authorized for detection and/or diagnosis of SARS-CoV-2 by FDA under an Emergency Use Authorization (EUA).  This EUA  will remain in effect (meaning this test can be used) for the duration of the COVID-19 declaration under Section 564(b)(1) of the Act, 21 U.S.C. section 360bbb-3(b)(1), unless the authorization is terminated or revoked sooner.  Performed at [facility], [address] [place], [place] [address]     Coagulation Studies: Recent Labs    07/19/20 1225  LABPROT 13.5  INR 1.1    Urinalysis: Recent Labs    07/18/20 1229  COLORURINE YELLOW*  LABSPEC 1.014  PHURINE 8.0  GLUCOSEU 50*  HGBUR NEGATIVE  BILIRUBINUR NEGATIVE  KETONESUR NEGATIVE  PROTEINUR >=300*  NITRITE NEGATIVE  LEUKOCYTESUR NEGATIVE      Imaging: No results found.   Medications:    . allopurinol  100 mg Oral Daily  . atorvastatin  20 mg Oral Daily  . Chlorhexidine Gluconate Cloth  6 each Topical Daily  . ferrous sulfate  325 mg Oral BID WC  . hydrALAZINE  50 mg Oral TID  . lactulose  30 g Oral TID  . metoprolol succinate  25 mg Oral Daily  . pantoprazole  40 mg Oral Daily  . rifaximin  200 mg Oral BID  . sodium bicarbonate  650 mg Oral BID  . tamsulosin  0.4 mg Oral QPC supper   acetaminophen  Assessment/ Plan:  79 y.o. male with past medical history of ESRD on HD TTS, diabetes mellitus type 2, GERD, hypertension, hyperlipidemia, BPH, osteoarthritis, anemia of chronic kidney disease, secondary hyperparathyroidism who was admitted with altered mental status.  [facility] [place]/TTHS/LUE AVF  1.  ESRD on HD TTS.  Patient due for hemodialysis treatment today.  Orders have been prepared.  2.  Anemia of chronic kidney disease.  Hemoglobin currently 11.6.  Hold off on Epogen for the moment.  3.  Secondary hyperparathyroidism.  Continue to monitor bone mineral metabolism parameters over the course of the hospitalization.  Not currently on binders.  4.  Hypertension.  Maintain the patient on metoprolol and hydralazine.   LOS: 2 Egan Sahlin 8/5/20212:40 PM

## 2020-07-20 NOTE — Progress Notes (Signed)
Hemodialysis patient known at [facility] Phillip Heal) TTS 10:20, patient normally rides with [facility]. With the help of spanish interpreter neither patient nor son had any dialysis related concerns. Please contact me with any dialysis placement concerns.  [name] Dialysis Coordinator [phone]

## 2020-07-20 NOTE — Progress Notes (Signed)
PROGRESS NOTE  Martin Gilbert WCH:[id number] DOB: 1941/09/24   PCP: [name], MD  Patient is from: Home.  Reportedly independent with ADLs  DOA: 07/18/2020 LOS: 2  Brief Narrative / Interim history: 79 year old Spanish-speaking male with history of ESRD on HD TTS, CHF, chronic back pain, DM-2, HTN and HLD sent to ED from dialysis center with altered mental status.  Per family, had nausea and vomiting for few months prior to arrival.  Also had headache about 3 days prior to admission.  In ED, CTh negative for acute finding.  Ammonia 61.  CXR without acute finding.  RUQ [place] evidence of liver cirrhosis but no cholecystitis.  Admitted for acute metabolic encephalopathy/possible hepatic encephalopathy.  Subjective: Seen and examined earlier this morning.  No major events overnight of this morning.  No other complaints other than asking about his hemodialysis repeatedly.  He also wanted to go home to look after his wife who is legally blind.  He is awake and alert today but only oriented to self.  He thinks he is in [place].  He fixates on dialysis even when asked about unrelated questions.  Video interpreter with ID number [id number] utilized for this encounter.  Objective: Vitals:   07/20/20 1430 07/20/20 1445 07/20/20 1500 07/20/20 1515  BP: (!) 168/77 (!) 159/72 (!) 175/83 (!) (P) 168/79  Pulse: 64 63 72 (P) 76  Resp:      Temp:      TempSrc:      SpO2:      Weight:      Height:        Intake/Output Summary (Last 24 hours) at 07/20/2020 1533 Last data filed at 07/20/2020 1352 Gross per 24 hour  Intake 120 ml  Output --  Net 120 ml   Filed Weights   07/18/20 1159 07/19/20 0500  Weight: 79.4 kg 69.8 kg    Examination:  GENERAL: Chronically ill-appearing.  No distress. HEENT: MMM.  Vision and hearing grossly intact.  NECK: Supple.  No apparent JVD.  RESP: On RA.  No IWOB.  Fair aeration bilaterally. CVS:  RRR. Heart sounds normal.  ABD/GI/GU: BS+. Abd soft, NTND.   MSK/EXT:  Moves extremities. No apparent deformity. No edema.  SKIN: no apparent skin lesion or wound NEURO: Awake and alert but only oriented to self.  Follows commands.  No apparent focal neuro deficit. PSYCH: Calm.  No distress or agitation.  Procedures:  None  Microbiology summarized: COVID-19 PCR negative. C. difficile screen pending.  Assessment & Plan: Acute metabolic encephalopathy: Suspecting hepatic encephalopathy given history of liver cirrhosis. No signs and symptoms of infectious process.  No signs of SBP.  No focal neuro deficit but only oriented to self.  Unrevealing work-up except for elevated ammonia which is improving..  -Continue lactulose -Delirium precautions.  Hematochezia: Probably had loose BM with fresh red blood on 8/4.  No further BM..  H&H and coag labs.  -Continue holding aspirin and subcu heparin. -SCD for VT prophylaxis. -Continue ferrous sulfate  Liver cirrhosis: Unclear etiology. No signs of ascites or SBP.  Acute hepatitis panel and HIV negative. -Continue lactulose as above  Nausea and vomiting: Seems to have resolved.  -As needed antiemetics  DM-2 with ESRD: A1c 5.3%. Recent Labs  Lab 07/19/20 0811 07/19/20 1137 07/19/20 1638 07/19/20 2033 07/20/20 0726  GLUCAP 89 127* 121* 125* 96  -Discontinued CBG monitoring and SSI.   ESRD on HD TTS: Completed full session on last HD yesterday.  No indication for emergent dialysis -  Going for HD today.  Essential hypertension: BP within fair range. -Continue metoprolol  Debility/physical deconditioning -PT/OT eval   Body mass index is 24.84 kg/m.         DVT prophylaxis:  Place and maintain sequential compression device Start: 07/19/20 1214 SCDs Start: 07/18/20 1525  Code Status: Full code Family Communication: None at bedside.  Attempted to call patient's daughter but no answer. Status is: Inpatient  Remains inpatient appropriate because:Altered mental status and Inpatient  level of care appropriate due to severity of illness.    Dispo: The patient is from: Home              Anticipated d/c is to: To be determined              Anticipated d/c date is: 2 days              Patient currently is not medically stable to d/c.       Consultants:  Nephrology   Sch Meds:  Scheduled Meds: . allopurinol  100 mg Oral Daily  . atorvastatin  20 mg Oral Daily  . Chlorhexidine Gluconate Cloth  6 each Topical Daily  . ferrous sulfate  325 mg Oral BID WC  . hydrALAZINE  50 mg Oral TID  . lactulose  30 g Oral TID  . metoprolol succinate  25 mg Oral Daily  . pantoprazole  40 mg Oral Daily  . rifaximin  200 mg Oral BID  . sodium bicarbonate  650 mg Oral BID  . tamsulosin  0.4 mg Oral QPC supper   Continuous Infusions: PRN Meds:.acetaminophen  Antimicrobials: Anti-infectives (From admission, onward)   Start     Dose/Rate Route Frequency Ordered Stop   07/18/20 2200  rifaximin (XIFAXAN) tablet 200 mg     Discontinue     200 mg Oral 2 times daily 07/18/20 1540         I have personally reviewed the following labs and images: CBC: Recent Labs  Lab 07/18/20 1207 07/18/20 1622 07/19/20 0416 07/19/20 1225 07/20/20 0501  WBC 5.3 5.0 4.3  --  4.0  NEUTROABS 3.5  --   --   --   --   HGB 13.1 12.4* 12.0* 12.2* 11.6*  HCT 38.2* 37.0* 34.2* 36.7* 33.9*  MCV 96.2 97.6 94.7  --  95.2  PLT 200 193 175  --  178   BMP &GFR Recent Labs  Lab 07/18/20 1207 07/19/20 0416 07/20/20 0501  NA 137 140 138  K 3.9 4.3 4.1  CL 96* 100 99  CO2 28 26 27   GLUCOSE 163* 95 93  BUN 46* 59* 72*  CREATININE 4.58* 6.18* 8.20*  CALCIUM 8.7* 8.3* 8.3*  MG 2.0  --  1.9  PHOS  --   --  5.0*   Estimated Creatinine Clearance: 6.5 mL/min (A) (by C-G formula based on SCr of 8.2 mg/dL (H)). Liver & Pancreas: Recent Labs  Lab 07/18/20 1207 07/20/20 0501  AST 40 29  ALT 26 19  ALKPHOS 212* 170*  BILITOT 1.9* 1.2  PROT 7.2 5.9*  ALBUMIN 3.3* 2.8*   Recent Labs  Lab  07/18/20 1207  LIPASE 27   Recent Labs  Lab 07/18/20 1229 07/19/20 0416 07/20/20 0501  AMMONIA 61* 63* 54*   Diabetic: Recent Labs    07/18/20 1621  HGBA1C 5.3   Recent Labs  Lab 07/19/20 0811 07/19/20 1137 07/19/20 1638 07/19/20 2033 07/20/20 0726  GLUCAP 89 127* 121* 125* 96   Cardiac  Enzymes: No results for input(s): CKTOTAL, CKMB, CKMBINDEX, TROPONINI in the last 168 hours. No results for input(s): PROBNP in the last 8760 hours. Coagulation Profile: Recent Labs  Lab 07/19/20 1225  INR 1.1   Thyroid Function Tests: Recent Labs    07/19/20 1353  TSH 1.335   Lipid Profile: No results for input(s): CHOL, HDL, LDLCALC, TRIG, CHOLHDL, LDLDIRECT in the last 72 hours. Anemia Panel: Recent Labs    07/19/20 1225  VITAMINB12 230   Urine analysis:    Component Value Date/Time   COLORURINE YELLOW (A) 07/18/2020 1229   APPEARANCEUR CLEAR (A) 07/18/2020 1229   APPEARANCEUR Turbid 08/29/2013 2049   LABSPEC 1.014 07/18/2020 1229   LABSPEC 1.015 08/29/2013 2049   PHURINE 8.0 07/18/2020 1229   GLUCOSEU 50 (A) 07/18/2020 1229   GLUCOSEU 50 mg/dL 08/29/2013 2049   HGBUR NEGATIVE 07/18/2020 [place] NEGATIVE 07/18/2020 1229   BILIRUBINUR Negative 08/29/2013 2049   KETONESUR NEGATIVE 07/18/2020 1229   PROTEINUR >=300 (A) 07/18/2020 1229   NITRITE NEGATIVE 07/18/2020 1229   LEUKOCYTESUR NEGATIVE 07/18/2020 1229   LEUKOCYTESUR 3+ 08/29/2013 2049   Sepsis Labs: Invalid input(s): PROCALCITONIN, [place]  Microbiology: Recent Results (from the past 240 hour(s))  SARS Coronavirus 2 by RT PCR (hospital order, performed in [facility] hospital lab) Nasopharyngeal     Status: None   Collection Time: 07/18/20 12:29 PM   Specimen: Nasopharyngeal  Result Value Ref Range Status   SARS Coronavirus 2 NEGATIVE NEGATIVE Final    Comment: (NOTE) SARS-CoV-2 target nucleic acids are NOT DETECTED.  The SARS-CoV-2 RNA is generally detectable in upper and  lower respiratory specimens during the acute phase of infection. The lowest concentration of SARS-CoV-2 viral copies this assay can detect is 250 copies / mL. A negative result does not preclude SARS-CoV-2 infection and should not be used as the sole basis for treatment or other patient management decisions.  A negative result may occur with improper specimen collection / handling, submission of specimen other than nasopharyngeal swab, presence of viral mutation(s) within the areas targeted by this assay, and inadequate number of viral copies (<250 copies / mL). A negative result must be combined with clinical observations, patient history, and epidemiological information.  Fact Sheet for Patients:   StrictlyIdeas.no  Fact Sheet for Healthcare Providers: BankingDealers.co.za  This test is not yet approved or  cleared by the [place] FDA and has been authorized for detection and/or diagnosis of SARS-CoV-2 by FDA under an Emergency Use Authorization (EUA).  This EUA will remain in effect (meaning this test can be used) for the duration of the COVID-19 declaration under Section 564(b)(1) of the Act, 21 U.S.C. section 360bbb-3(b)(1), unless the authorization is terminated or revoked sooner.  Performed at [facility], [address] [place], [place] [address]     Radiology Studies: No results found.    Daimien Patmon T. Sunray  If 7PM-7AM, please contact night-coverage [email or web address] Password [facility] 07/20/2020, 3:33 PM

## 2020-07-20 NOTE — Progress Notes (Signed)
Dr Cyndia Skeeters made aware that pt concerned that he is urinating blood, per [place] order UA, MD made aware that pt has only been urinating drips today, yesterday had slightly larger amt of bloody urine, on HD and may be hard to collect a sample

## 2020-07-21 DIAGNOSIS — Z789 Other specified health status: Secondary | ICD-10-CM

## 2020-07-21 LAB — RENAL FUNCTION PANEL
Albumin: 2.8 g/dL — ABNORMAL LOW (ref 3.5–5.0)
Anion gap: 12 (ref 5–15)
BUN: 34 mg/dL — ABNORMAL HIGH (ref 8–23)
CO2: 29 mmol/L (ref 22–32)
Calcium: 7.9 mg/dL — ABNORMAL LOW (ref 8.9–10.3)
Chloride: 96 mmol/L — ABNORMAL LOW (ref 98–111)
Creatinine, Ser: 6.06 mg/dL — ABNORMAL HIGH (ref 0.61–1.24)
GFR calc Af Amer: 9 mL/min — ABNORMAL LOW (ref >60)
GFR calc non Af Amer: 8 mL/min — ABNORMAL LOW (ref >60)
Glucose, Bld: 97 mg/dL (ref 70–99)
Phosphorus: 4.5 mg/dL (ref 2.5–4.6)
Potassium: 3.7 mmol/L (ref 3.5–5.1)
Sodium: 137 mmol/L (ref 135–145)

## 2020-07-21 LAB — CBC
HCT: 33.9 % — ABNORMAL LOW (ref 39.0–52.0)
Hemoglobin: 11.8 g/dL — ABNORMAL LOW (ref 13.0–17.0)
MCH: 33.2 pg (ref 26.0–34.0)
MCHC: 34.8 g/dL (ref 30.0–36.0)
MCV: 95.5 fL (ref 80.0–100.0)
Platelets: 150 10*3/uL (ref 150–400)
RBC: 3.55 MIL/uL — ABNORMAL LOW (ref 4.22–5.81)
RDW: 13.9 % (ref 11.5–15.5)
WBC: 3 10*3/uL — ABNORMAL LOW (ref 4.0–10.5)
nRBC: 0 % (ref 0.0–0.2)

## 2020-07-21 LAB — AMMONIA: Ammonia: 45 umol/L — ABNORMAL HIGH (ref 9–35)

## 2020-07-21 LAB — MAGNESIUM: Magnesium: 1.8 mg/dL (ref 1.7–2.4)

## 2020-07-21 MED ORDER — LACTULOSE 10 GM/15ML PO SOLN: ORAL | 0 refills | Status: DC

## 2020-07-21 MED ORDER — QUETIAPINE FUMARATE 25 MG PO TABS: 25.0000 mg | ORAL_TABLET | Freq: Every evening | ORAL | Status: DC | PRN

## 2020-07-21 NOTE — Progress Notes (Signed)
Occupational Therapy Treatment Patient Details Name: Martin Gilbert MRN: [id number] DOB: [birth year] Today's Date: 07/21/2020    History of present illness 79 year old Spanish-speaking male with history of ESRD on HD TTS, CHF, chronic back pain, DM-2, HTN and HLD sent to ED from dialysis center with altered mental status.  Per family, had nausea and vomiting for few months prior to arrival.  Also had headache about 3 days prior to admission. In ED, CTh negative for acute finding.  Ammonia 61.  CXR without acute finding.  RUQ [place] evidence of liver cirrhosis but no cholecystitis.  Admitted for acute metabolic encephalopathy/possible hepatic encephalopathy.   OT comments  Pt seen for OT tx this date to f/u re: safety with ADLs/ADL mobility. Pt resting in bed when OT presents, but wakes easily. Interpreter phone utilized t/o session (Alexia ID# [phone]) with some degree of difficulty as the interpreter reports pt's speech appears somewhat slurred. OT engages pt in sup to sit to EOB with CGA and use of bed rails with HOB slightly elevated. Pt demos G static sitting balance once to EOB, F dynamic sitting balance-difficulty dynamic reaching to adjust socks ultimately requiring MOD A from OT for safety. OT assists pt in sit to stand transfer with MIN A with use of RW. While RW was beneficial for transfer, pt is unable to safely sequence use of RW to take 3-4 steps from bed to recliner. RW ultimately removed and pt assisted with HHA for fall prevention. In addition, pt with difficulty following commands with negotiating environment to find chair (ex: pt takes steps FWD rather than BKWD when directed). Pt safely to chair with chair alarm and all needs in reach. OT notes that pt struggling with food items on tray and he points to his teeth to indicate there aren't many (interpreter helps clarify). OT provides tray setup to cut up food as pt with difficulty sequencing simultaneous use of fork and knife. MIN A  required to use fork, but pt can reasonably use spoon with setup only for softer food items. Pt still requiring MIN A for most aspects of self care and mobility. HHOT with 24/7 supv from family continues to be most appropriate d/c recommendation.   Follow Up Recommendations  Home health OT;Supervision/Assistance - 24 hour    Equipment Recommendations  None recommended by OT    Recommendations for Other Services      Precautions / Restrictions Precautions Precautions: Fall Restrictions Weight Bearing Restrictions: No       Mobility Bed Mobility Overal bed mobility: Needs Assistance Bed Mobility: Supine to Sit     Supine to sit: Min guard (extended time, use of bed rail, assist for UB)        Transfers Overall transfer level: Needs assistance Equipment used: 1 person hand held assist Transfers: Sit to/from Stand Sit to Stand: Min assist         General transfer comment: attempted use of RW as pt needs UE support to sustain balance, however, pt unable to safely use d/t poor cue following/sequencing and ultimately RW removed and HHA utilized.    Balance Overall balance assessment: Needs assistance Sitting-balance support: Feet supported Sitting balance-Leahy Scale: Fair     Standing balance support: During functional activity;Single extremity supported Standing balance-Leahy Scale: Fair Standing balance comment: Requires MIN A and at least 1 UE support                           ADL either  performed or assessed with clinical judgement   ADL Overall ADL's : Needs assistance/impaired Eating/Feeding: Set up;Minimal assistance;Sitting Eating/Feeding Details (indicate cue type and reason): Up in chair, assist to cut up food as pt with few teeth, demos good hand to mouth with spoon, some diffciulty with coordinating use of fork to stab and then rotate in hand.                 Lower Body Dressing: Moderate assistance;Sitting/lateral leans Lower Body  Dressing Details (indicate cue type and reason): to adjust socks as they are noted to be hanging off. Pt with diffciulty sequencing task, but also with dynamic balance aspect of reaching feet in sitting.                     Vision Baseline Vision/History: Wears glasses Wears Glasses: At all times Patient Visual Report: No change from baseline     Perception     Praxis      Cognition Arousal/Alertness: Awake/alert Behavior During Therapy: Restless;Flat affect;Impulsive Overall Cognitive Status: Impaired/Different from baseline Area of Impairment: Orientation;Memory;Safety/judgement                 Orientation Level: Disoriented to;Place;Time;Situation   Memory: Decreased short-term memory   Safety/Judgement: Decreased awareness of safety;Decreased awareness of deficits     General Comments: pt oriented to self, able to give DOB. However, he has difficulty with following simple one step commands. Ex: when asked to take steps BKWD toward chair, pt takes steps FWD, requires tactile cues and directing.        Exercises Other Exercises Other Exercises: OT attempts to facilitate safety education with pt re: use of call button and notifying pt of use of chair alarm, poor reception detected. Other Exercises: Attempted to use [employer] visual/virtual interpreter services with no answer after ~5 minutes. Then utilized [employer]. Assisted by Alexia, ID [id number]   Shoulder Instructions       General Comments      Pertinent Vitals/ Pain       Pain Assessment: Faces Faces Pain Scale: Hurts a little bit Pain Location: points to R LE when asked, but unable to quntify despite being asked two times through use of phone interpreter. Pain Descriptors / Indicators: Grimacing Pain Intervention(s): Limited activity within patient's tolerance;Monitored during session  Home Living                                          Prior  Functioning/Environment              Frequency  Min 2X/week        Progress Toward Goals  OT Goals(current goals can now be found in the care plan section)  Progress towards OT goals: Progressing toward goals  Acute Rehab OT Goals Patient Stated Goal: return home and make better OT Goal Formulation: With family Time For Goal Achievement: 08/03/20 Potential to Achieve Goals: Good  Plan Discharge plan remains appropriate    Co-evaluation                 AM-PAC OT "6 Clicks" Daily Activity     Outcome Measure   Help from another person eating meals?: A Little Help from another person taking care of personal grooming?: A Little Help from another person toileting, which includes using toliet, bedpan, or urinal?: A Little Help from another person  bathing (including washing, rinsing, drying)?: A Little Help from another person to put on and taking off regular upper body clothing?: A Little Help from another person to put on and taking off regular lower body clothing?: A Little 6 Click Score: 18    End of Session Equipment Utilized During Treatment: Gait belt;Rolling walker  OT Visit Diagnosis: Unsteadiness on feet (R26.81);Muscle weakness (generalized) (M62.81)   Activity Tolerance Patient tolerated treatment well   Patient Left in chair;with call bell/phone within reach;with chair alarm set   Nurse Communication Mobility status;Other (comment) (notfied that pt up to chair with alarm, phone and call button in reach, but questionable whether pt will use. In addition, notified of use of arm in arm t/f technique versus use of RW as pt with difficulty sequencing.)        Time: 1518-3437 OT Time Calculation (min): 38 min  Charges: OT General Charges $OT Visit: 1 Visit OT Treatments $Self Care/Home Management : 23-37 mins $Therapeutic Activity: 8-22 mins   Gerrianne Scale, [place], OTR/L ascom [phone] 07/21/20, 10:29 AM

## 2020-07-21 NOTE — Evaluation (Signed)
Physical Therapy Evaluation Patient Details Name: Martin Gilbert MRN: [id number] DOB: Mar 13, 1941 Today's Date: 07/21/2020   History of Present Illness  79 year old Spanish-speaking male with history of ESRD on HD TTS, CHF, chronic back pain, DM-2, HTN and HLD sent to ED from dialysis center with altered mental status.  Per family, had nausea and vomiting for few months prior to arrival.  Also had headache about 3 days prior to admission. In ED, CTh negative for acute finding.  Ammonia 61.  CXR without acute finding.  RUQ [place] evidence of liver cirrhosis but no cholecystitis.  Admitted for acute metabolic encephalopathy/possible hepatic encephalopathy.    Clinical Impression  Pt was pleasant and motivated to participate during the session.  Spanish interpreter Roselie Skinner [phone] utilized throughout the session with pt consistently able to follow 1-step commands.  Pt demonstrated fair functional strength and was able to perform all below functional tasks without physical assistance.  Pt did require increased time and effort for bed mobility tasks and ambulated with a slow, effortful cadence but declined several offers to rest while walking and ended up ambulating 150 feet.  Pt's SpO2 and HR were WNL during ambulation with no reports of adverse symptoms.  Pt will benefit from HHPT services upon discharge to safely address deficits listed in patient problem list for decreased caregiver assistance and eventual return to PLOF.      Follow Up Recommendations Home health PT;Supervision for mobility/OOB    Equipment Recommendations  None recommended by PT    Recommendations for Other Services       Precautions / Restrictions Precautions Precautions: Fall Restrictions Weight Bearing Restrictions: No      Mobility  Bed Mobility Overal bed mobility: Modified Independent Bed Mobility: Supine to Sit     Supine to sit: Modified independent (Device/Increase time)     General bed mobility  comments: Extra time and effort but no physical assistance needed  Transfers Overall transfer level: Needs assistance Equipment used: Rolling walker (2 wheeled) Transfers: Sit to/from Stand Sit to Stand: Min guard         General transfer comment: Fair eccentric and concentric control and stability  Ambulation/Gait Ambulation/Gait assistance: Min guard Gait Distance (Feet): 150 Feet x 1, 15' x 2 Assistive device: Rolling walker (2 wheeled) Gait Pattern/deviations: Trunk flexed;Decreased step length - right;Decreased step length - left;Step-through pattern Gait velocity: decreased   General Gait Details: Slow cadence with mod verbal cues for amb closer to the RW but steady without LOB; SpO2 and HR monitored during the session including during amb and WNL throughout  Stairs            Wheelchair Mobility    Modified Rankin (Stroke Patients Only)       Balance Overall balance assessment: Needs assistance Sitting-balance support: Feet supported;Bilateral upper extremity supported Sitting balance-Leahy Scale: Good     Standing balance support: During functional activity;Bilateral upper extremity supported Standing balance-Leahy Scale: Fair                               Pertinent Vitals/Pain Pain Assessment: No/denies pain    Home Living Family/patient expects to be discharged to:: Private residence Living Arrangements: Children;Spouse/significant other Available Help at Discharge: Family;Available 24 hours/day Type of Home: House Home Access: Level entry     Home Layout: One level Home Equipment: Walker - 4 wheels;Bedside commode;Shower seat;Walker - 2 wheels Additional Comments: History and PLOF from chart review and patient with  assistance from interpreter    Prior Function Level of Independence: Independent with assistive device(s)         Comments: Mod Ind ambulation community distances with a RW     Hand Dominance   Dominant Hand:  Right    Extremity/Trunk Assessment   Upper Extremity Assessment Upper Extremity Assessment: Generalized weakness    Lower Extremity Assessment Lower Extremity Assessment: Generalized weakness       Communication   Communication: Interpreter utilized;Other (comment) (Language Line spanish interpreter Roselie Skinner [phone])  Cognition Arousal/Alertness: Awake/alert Behavior During Therapy: Flat affect Overall Cognitive Status: No family/caregiver present to determine baseline cognitive functioning                                 General Comments: Pt able to provide history and follow 1-step commands well with assistance from [place] interpreter      General Comments      Exercises     Assessment/Plan    PT Assessment Patient needs continued PT services  PT Problem List Decreased strength;Decreased activity tolerance;Decreased balance;Decreased mobility;Decreased knowledge of use of DME       PT Treatment Interventions DME instruction;Gait training;Functional mobility training;Therapeutic activities;Therapeutic exercise;Balance training;Patient/family education    PT Goals (Current goals can be found in the Care Plan section)  Acute Rehab PT Goals Patient Stated Goal: To return home PT Goal Formulation: With patient Time For Goal Achievement: 08/03/20 Potential to Achieve Goals: Good    Frequency Min 2X/week   Barriers to discharge        Co-evaluation               AM-PAC PT "6 Clicks" Mobility  Outcome Measure Help needed turning from your back to your side while in a flat bed without using bedrails?: A Little Help needed moving from lying on your back to sitting on the side of a flat bed without using bedrails?: A Little Help needed moving to and from a bed to a chair (including a wheelchair)?: A Little Help needed standing up from a chair using your arms (e.g., wheelchair or bedside chair)?: A Little Help needed to walk in hospital room?: A  Little Help needed climbing 3-5 steps with a railing? : A Little 6 Click Score: 18    End of Session Equipment Utilized During Treatment: Gait belt Activity Tolerance: Patient tolerated treatment well Patient left: in chair;with call bell/phone within reach;with chair alarm set Nurse Communication: Mobility status PT Visit Diagnosis: Difficulty in walking, not elsewhere classified (R26.2);Muscle weakness (generalized) (M62.81)    Time: 0102-7253 PT Time Calculation (min) (ACUTE ONLY): 32 min   Charges:   PT Evaluation $PT Eval Moderate Complexity: 1 Mod PT Treatments $Gait Training: 8-22 mins        D. Scott Malin Sambrano PT, DPT 07/21/20, 5:00 PM

## 2020-07-21 NOTE — TOC Transition Note (Signed)
Transition of Care [facility]) - CM/SW Discharge Note   Patient Details  Name: Martin Gilbert MRN: [id number] Date of Birth: [birth year]  [facility] of [facility]) CM/SW Contact:  Elease Hashimoto, LCSW Phone Number: 07/21/2020, 4:12 PM   Clinical Narrative:   MD feels pt is medically stable discharge today home with family. Have added Rn and aide to referral for PT and OT. Daughter in room and will make sure Dad will have 24 hr care to make sure doing well at home with wife. No other needs due to DC will have HD tomorrow and set up. No further follow due to DC today    Final next level of care: Home w Home Health Services Barriers to Discharge: Barriers Resolved   Patient Goals and CMS Choice Patient states their goals for this hospitalization and ongoing recovery are:: Daughter speaks for pt-she reports the family will make sure he and his wife have what they need CMS Medicare.gov Compare Post Acute Care list provided to:: Patient Choice offered to / list presented to : Patient, Adult Children  Discharge Placement                Patient to be transferred to facility by: Daughter via car Name of family member notified: Angie Patient and family notified of of transfer: 07/21/20  Discharge Plan and Services In-house Referral: Clinical Social Work   Post Acute Care Choice: Home Health                    HH Arranged: RN, Nurse's Aide [place] Agency: [place] ([facility]) Date HH Agency Contacted: 07/21/20 Time HH Agency Contacted: 1000 Representative spoke with at [place]: [place] ([place]) Interventions     Readmission Risk Interventions No flowsheet data found.

## 2020-07-21 NOTE — Care Management Important Message (Signed)
Important Message  Patient Details  Name: Martin Gilbert MRN: [id number] Date of Birth: 1941/12/03   Medicare Important Message Given:  Yes  Explained and gave to daughter who speaks English in the room. Pt confused   Elease Hashimoto, LCSW 07/21/2020, 11:01 AM

## 2020-07-21 NOTE — Progress Notes (Signed)
Patient discharged home per MD order. All discharge instructions given to patient and daughter and all questions answered. 

## 2020-07-21 NOTE — Progress Notes (Signed)
PROGRESS NOTE  Martin Gilbert YCX:[id number] DOB: 07-08-1941   PCP: [name], MD  Patient is from: Home.  Reportedly independent with ADLs  DOA: 07/18/2020 LOS: 3  Brief Narrative / Interim history: 79 year old Spanish-speaking male with history of ESRD on HD TTS, CHF, chronic back pain, DM-2, HTN and HLD sent to ED from dialysis center with altered mental status.  Per family, had nausea and vomiting for few months prior to arrival.  Also had headache about 3 days prior to admission.  In ED, CTh negative for acute finding.  Ammonia 61.  CXR without acute finding.  RUQ [place] evidence of liver cirrhosis but no cholecystitis.  Admitted for acute metabolic encephalopathy/hepatic encephalopathy.  Subjective: Seen and examined earlier this morning.  Patient was confused and agitated overnight.  He was given Seroquel.  He is sleepy this morning.  He wakes to voice but somewhat groggy.  He is only oriented to self.  Follows some commands.  Does not appear to be in distress.  Video interpreter with ID number [id number] utilized for this encounter.  Objective: Vitals:   07/20/20 1541 07/20/20 1609 07/20/20 1937 07/21/20 0543  BP: (!) 178/102 (!) 171/58 (!) 152/51 (!) 145/62  Pulse: 79 74 76 66  Resp:    15  Temp:  97.9 F (36.6 C) 98.3 F (36.8 C) 97.7 F (36.5 C)  TempSrc:  Oral Oral Oral  SpO2:  99% 97% 98%  Weight:      Height:        Intake/Output Summary (Last 24 hours) at 07/21/2020 1004 Last data filed at 07/20/2020 1845 Gross per 24 hour  Intake 0 ml  Output 1500 ml  Net -1500 ml   Filed Weights   07/18/20 1159 07/19/20 0500  Weight: 79.4 kg 69.8 kg    Examination:  GENERAL: Chronically ill-appearing.  Somewhat sleepy. HEENT: MMM.  Vision and hearing grossly intact.  NECK: Supple.  No apparent JVD.  RESP: On RA.  No IWOB.  Fair aeration bilaterally. CVS:  RRR. Heart sounds normal.  ABD/GI/GU: BS+. Abd soft, NTND.  MSK/EXT:  Moves extremities. No apparent  deformity. No edema.  SKIN: no apparent skin lesion or wound NEURO: Sleepy but wakes to voice.  Somewhat groggy.  Oriented to self only.  Follows some commands.  No apparent focal neuro deficits. PSYCH: Calm.  No distress or agitation.  Procedures:  None  Microbiology summarized: COVID-19 PCR negative. C. difficile screen pending.  Assessment & Plan: Acute metabolic encephalopathy/hepatic encephalopathy:  No signs of SBP.  No focal neuro deficit but only oriented to self.  Unrevealing work-up except for elevated ammonia which is improving.  He is somewhat groggy and sleepy this morning partly due to Seroquel. -Continue lactulose -Delirium precautions. -Avoid sedating medications.  Changed Seroquel to as needed -PT/OT eval pending  Liver cirrhosis: Unclear etiology. No signs of ascites or SBP.  Acute hepatitis panel and HIV negative. -Continue lactulose as above  Hematochezia: Probably had loose BM with fresh red blood on 8/4.  No further BM.  H&H and coag labs.  -Continue holding aspirin and subcu heparin. -SCD for VTE prophylaxis. -Continue ferrous sulfate  Nausea and vomiting: Seems to have resolved.  -As needed antiemetics  DM-2 with ESRD: A1c 5.3%.  BMP glucose was in fair range. -Discontinued CBG monitoring and SSI.  ESRD on HD TTS:  -Per nephrology.  Hematuria?  Patient reported hematuria.  Urine admission without blood.  No apparent bleeding on genital exam. -Urinalysis if able to get  urine  Essential hypertension: BP within fair range. -Continue metoprolol  Debility/physical deconditioning -PT/OT eval pending   Body mass index is 24.84 kg/m.         DVT prophylaxis:  Place and maintain sequential compression device Start: 07/19/20 1214 SCDs Start: 07/18/20 1525  Code Status: Full code Family Communication: Updated patient's daughter over the phone on 8/5. Status is: Inpatient  Remains inpatient appropriate because:Altered mental status and  Inpatient level of care appropriate due to severity of illness.    Dispo: The patient is from: Home              Anticipated d/c is to: To be determined              Anticipated d/c date is: 2 days              Patient currently is not medically stable to d/c.       Consultants:  Nephrology   Sch Meds:  Scheduled Meds: . allopurinol  100 mg Oral Daily  . atorvastatin  20 mg Oral Daily  . Chlorhexidine Gluconate Cloth  6 each Topical Daily  . ferrous sulfate  325 mg Oral BID WC  . hydrALAZINE  50 mg Oral TID  . lactulose  30 g Oral TID  . metoprolol succinate  25 mg Oral Daily  . pantoprazole  40 mg Oral Daily  . QUEtiapine  25 mg Oral QHS  . rifaximin  200 mg Oral BID  . sodium bicarbonate  650 mg Oral BID  . tamsulosin  0.4 mg Oral QPC supper   Continuous Infusions: PRN Meds:.acetaminophen  Antimicrobials: Anti-infectives (From admission, onward)   Start     Dose/Rate Route Frequency Ordered Stop   07/18/20 2200  rifaximin (XIFAXAN) tablet 200 mg     Discontinue     200 mg Oral 2 times daily 07/18/20 1540         I have personally reviewed the following labs and images: CBC: Recent Labs  Lab 07/18/20 1207 07/18/20 1207 07/18/20 1622 07/19/20 0416 07/19/20 1225 07/20/20 0501 07/21/20 0801  WBC 5.3  --  5.0 4.3  --  4.0 3.0*  NEUTROABS 3.5  --   --   --   --   --   --   HGB 13.1   < > 12.4* 12.0* 12.2* 11.6* 11.8*  HCT 38.2*   < > 37.0* 34.2* 36.7* 33.9* 33.9*  MCV 96.2  --  97.6 94.7  --  95.2 95.5  PLT 200  --  193 175  --  178 150   < > = values in this interval not displayed.   BMP &GFR Recent Labs  Lab 07/18/20 1207 07/19/20 0416 07/20/20 0501 07/21/20 0801  NA 137 140 138 137  K 3.9 4.3 4.1 3.7  CL 96* 100 99 96*  CO2 28 26 27 29   GLUCOSE 163* 95 93 97  BUN 46* 59* 72* 34*  CREATININE 4.58* 6.18* 8.20* 6.06*  CALCIUM 8.7* 8.3* 8.3* 7.9*  MG 2.0  --  1.9 1.8  PHOS  --   --  5.0* 4.5   Estimated Creatinine Clearance: 8.8 mL/min (A)  (by C-G formula based on SCr of 6.06 mg/dL (H)). Liver & Pancreas: Recent Labs  Lab 07/18/20 1207 07/20/20 0501 07/21/20 0801  AST 40 29  --   ALT 26 19  --   ALKPHOS 212* 170*  --   BILITOT 1.9* 1.2  --   PROT 7.2 5.9*  --  ALBUMIN 3.3* 2.8* 2.8*   Recent Labs  Lab 07/18/20 1207  LIPASE 27   Recent Labs  Lab 07/18/20 1229 07/19/20 0416 07/20/20 0501 07/21/20 0801  AMMONIA 61* 63* 54* 45*   Diabetic: Recent Labs    07/18/20 1621  HGBA1C 5.3   Recent Labs  Lab 07/19/20 0811 07/19/20 1137 07/19/20 1638 07/19/20 2033 07/20/20 0726  GLUCAP 89 127* 121* 125* 96   Cardiac Enzymes: No results for input(s): CKTOTAL, CKMB, CKMBINDEX, TROPONINI in the last 168 hours. No results for input(s): PROBNP in the last 8760 hours. Coagulation Profile: Recent Labs  Lab 07/19/20 1225  INR 1.1   Thyroid Function Tests: Recent Labs    07/19/20 1353  TSH 1.335   Lipid Profile: No results for input(s): CHOL, HDL, LDLCALC, TRIG, CHOLHDL, LDLDIRECT in the last 72 hours. Anemia Panel: Recent Labs    07/19/20 1225  VITAMINB12 230   Urine analysis:    Component Value Date/Time   COLORURINE RED (A) 07/20/2020 1851   APPEARANCEUR TURBID (A) 07/20/2020 1851   APPEARANCEUR Turbid 08/29/2013 2049   LABSPEC 1.020 07/20/2020 1851   LABSPEC 1.015 08/29/2013 2049   PHURINE  07/20/2020 1851    TEST NOT REPORTED DUE TO COLOR INTERFERENCE OF URINE PIGMENT   GLUCOSEU (A) 07/20/2020 1851    TEST NOT REPORTED DUE TO COLOR INTERFERENCE OF URINE PIGMENT   GLUCOSEU 50 mg/dL 08/29/2013 2049   HGBUR (A) 07/20/2020 1851    TEST NOT REPORTED DUE TO COLOR INTERFERENCE OF URINE PIGMENT   BILIRUBINUR (A) 07/20/2020 1851    TEST NOT REPORTED DUE TO COLOR INTERFERENCE OF URINE PIGMENT   BILIRUBINUR Negative 08/29/2013 2049   KETONESUR (A) 07/20/2020 1851    TEST NOT REPORTED DUE TO COLOR INTERFERENCE OF URINE PIGMENT   PROTEINUR (A) 07/20/2020 1851    TEST NOT REPORTED DUE TO COLOR  INTERFERENCE OF URINE PIGMENT   NITRITE (A) 07/20/2020 1851    TEST NOT REPORTED DUE TO COLOR INTERFERENCE OF URINE PIGMENT   LEUKOCYTESUR (A) 07/20/2020 1851    TEST NOT REPORTED DUE TO COLOR INTERFERENCE OF URINE PIGMENT   LEUKOCYTESUR 3+ 08/29/2013 2049   Sepsis Labs: Invalid input(s): PROCALCITONIN, [place]  Microbiology: Recent Results (from the past 240 hour(s))  SARS Coronavirus 2 by RT PCR (hospital order, performed in [facility] hospital lab) Nasopharyngeal     Status: None   Collection Time: 07/18/20 12:29 PM   Specimen: Nasopharyngeal  Result Value Ref Range Status   SARS Coronavirus 2 NEGATIVE NEGATIVE Final    Comment: (NOTE) SARS-CoV-2 target nucleic acids are NOT DETECTED.  The SARS-CoV-2 RNA is generally detectable in upper and lower respiratory specimens during the acute phase of infection. The lowest concentration of SARS-CoV-2 viral copies this assay can detect is 250 copies / mL. A negative result does not preclude SARS-CoV-2 infection and should not be used as the sole basis for treatment or other patient management decisions.  A negative result may occur with improper specimen collection / handling, submission of specimen other than nasopharyngeal swab, presence of viral mutation(s) within the areas targeted by this assay, and inadequate number of viral copies (<250 copies / mL). A negative result must be combined with clinical observations, patient history, and epidemiological information.  Fact Sheet for Patients:   StrictlyIdeas.no  Fact Sheet for Healthcare Providers: BankingDealers.co.za  This test is not yet approved or  cleared by the [place] FDA and has been authorized for detection and/or diagnosis of SARS-CoV-2 by FDA  under an Emergency Use Authorization (EUA).  This EUA will remain in effect (meaning this test can be used) for the duration of the COVID-19 declaration under Section  564(b)(1) of the Act, 21 U.S.C. section 360bbb-3(b)(1), unless the authorization is terminated or revoked sooner.  Performed at [facility], [address] [place], [place] [address]     Radiology Studies: No results found.    Sennie Borden T. [place]  If 7PM-7AM, please contact night-coverage [email or web address] Password [email or web address] 07/21/2020, 10:04 AM

## 2020-07-21 NOTE — TOC Initial Note (Signed)
Transition of Care [facility]) - Initial/Assessment Note    Patient Details  Name: Martin Gilbert MRN: [id number] Date of Birth: Dec 03, 1941  [facility]) CM/SW Contact:    Elease Hashimoto, LCSW Phone Number: 07/21/2020, 1:27 PM  Clinical Narrative:  Met with pt and daughter-Martin Gilbert speaks English to discuss discharge needs. Has all needed equipment but therapists recommend HHPT & OT no preference. Referral made to [facility] to provide this once medically stable for DC. Pt goes to HD-T,TH Sat [place]-goes with [facility] transport. Pt lives with his wife who is in poor health and family makes sure she has what she needs at home. Adult child transports to appointments and assist with home management. Will continue to follow until ready for discharge. Family concerned with his confusion which has been while here and right before admitted to the hospital. They plan to talk with MD about this.            Expected Discharge Plan: [place] Barriers to Discharge: Continued Medical Work up   Patient Goals and CMS Choice Patient states their goals for this hospitalization and ongoing recovery are:: Daughter speaks for pt-she reports the family will make sure he and his wife have what they need CMS Medicare.gov Compare Post Acute Care list provided to:: Patient Choice offered to / list presented to : Patient, Adult Children  Expected Discharge Plan and Services Expected Discharge Plan: [place] In-house Referral: Clinical Social Work   Post Acute Care Choice: [place] arrangements for the past 2 months: Single Family Home                           HH Arranged: OT, PT [place] Agency: [place] ([facility]) Date HH Agency Contacted: 07/21/20 Time HH Agency Contacted: 1000 Representative spoke with at [place]: [place] Arrangements/Services Living arrangements for the past 2 months: [place] with:: Spouse,  Adult Children Patient language and need for interpreter reviewed:: Yes Do you feel safe going back to the place where you live?: Yes      Need for Family Participation in Patient Care: Yes (Comment) Care giver support system in place?: Yes (comment) Current home services: DME (has cane and bsc) Criminal Activity/Legal Involvement Pertinent to Current Situation/Hospitalization: No - Comment as needed  Activities of Daily Living Home Assistive Devices/Equipment: None    Permission Sought/Granted Permission sought to share information with : Facility Sport and exercise psychologist, Family Supports Permission granted to share information with : Yes, Verbal Permission Granted  Share Information with NAME: Martin Gilbert  Permission granted to share info w AGENCY: [facility]  Permission granted to share info w Relationship: daughter  Permission granted to share info w Contact Information: Corene Cornea  Emotional Assessment Appearance:: Appears stated age Attitude/Demeanor/Rapport: Engaged Affect (typically observed): Accepting, Calm Orientation: : Oriented to Self, Oriented to Place   Psych Involvement: No (comment)  Admission diagnosis:  Bilirubinemia [E80.6] First degree AV block [I44.0] ESRD (end stage renal disease) ([place]) [N18.6] QT prolongation [R94.31] Increased ammonia level [R79.89] Altered mental status, unspecified altered mental status type [G66.59] Acute metabolic encephalopathy [D35.70] Vomiting, intractability of vomiting not specified, presence of nausea not specified, unspecified vomiting type [R11.10] Patient Active Problem List   Diagnosis Date Noted  . Acute metabolic encephalopathy 17/79/3903  . Acute gastroenteritis 10/11/2018  . Intractable nausea and vomiting 10/10/2018  . Sacral fracture ([place]) 09/29/2018  . Complication of arteriovenous dialysis  fistula 09/01/2017  . ESRD on dialysis ([place]) 06/03/2017  . Colitis 03/27/2017  . Demand ischemia ([place]) 03/27/2017  . Enteritis due to  Clostridium difficile   . Protein-calorie malnutrition, severe 02/18/2017  . C. difficile colitis 02/15/2017  . UTI (urinary tract infection) 02/02/2017  . E coli infection 02/02/2017  . Lactic acidosis 02/02/2017  . Diabetic neuropathy ([place]) 02/02/2017  . Left-sided chest wall pain 02/02/2017  . Fall 02/02/2017  . Generalized weakness 02/02/2017  . Pink eye, left 02/02/2017  . Leukocytosis 02/02/2017  . Anemia of chronic disease 02/02/2017  . Nausea and vomiting 02/02/2017  . Sepsis ([place]) 01/31/2017  . Foot pain 10/08/2016  . Osteoarthritis of knee 10/08/2016  . Essential hypertension 11/28/2015  . Chronic gout due to renal impairment, right ankle and foot, without tophus (tophi) 07/31/2015  . Bone lesion 06/05/2015  . Primary osteoarthritis of both knees 06/05/2015  . Pain in the chest 02/15/2015  . Neck pain 02/15/2015  . Diabetes type 2, uncontrolled ([place]) 02/15/2015  . Frequent headaches 02/15/2015  . Type 2 diabetes mellitus with hyperglycemia ([place]) 02/15/2015  . Hypertrophy of prostate with urinary obstruction and other lower urinary tract symptoms (LUTS) 09/07/2013   PCP:  [name], MD Pharmacy:   [facility], [address] [place] [address] Phone: [phone] Fax: [phone]     Social Determinants of Health (SDOH) Interventions    Readmission Risk Interventions No flowsheet data found.

## 2020-07-21 NOTE — Discharge Summary (Signed)
Physician Discharge Summary  Martin Gilbert INO:[id number] DOB: 06/23/1941 DOA: 07/18/2020  PCP: [name], MD  Admit date: 07/18/2020 Discharge date: 07/21/2020  Admitted From: Home Disposition: Home  Recommendations for Outpatient Follow-up:  1. Follow ups as below. 2. Please obtain CBC/BMP/Mag at follow up 3. Please follow up on the following pending results: None  Home Health: PT/OT Equipment/Devices: None  Discharge Condition: Stable CODE STATUS: Full code   Follow-up Information    [name], MD. Go on 07/24/2020.   Specialty: Family Medicine Why: 3:00pm Contact information: [place] [id number] [phone]                Hospital Course: 79 year old Spanish-speaking male with history of ESRD on HD TTS, CHF, chronic back pain, DM-2, HTN and HLD sent to ED from dialysis center with altered mental status.  Per family, had nausea and vomiting for few months prior to arrival.  Also had headache about 3 days prior to admission.  In ED, CTh negative for acute finding.  Ammonia 61.  CXR without acute finding.  RUQ [place] evidence of liver cirrhosis but no cholecystitis.  Admitted for acute metabolic encephalopathy/hepatic encephalopathy.  He was a started on lactulose.  Eventually mental status improved.  He was oriented to self, place, person, month, year and situation at the time of discharge.  He felt ready to go home and look after his legally blind wife who is also on dialysis.  Evaluated by therapy who recommended home health PT/OT.  Discharged on lactulose.   See individual problem list below for more hospital course.  Discharge Diagnoses:  Acute metabolic encephalopathy/hepatic encephalopathy:  Unrevealing work-up except for elevated ammonia which is improving.  No signs of SBP.   Encephalopathy resolved. -Discharged on lactulose. -Fluid management by dialysis  Liver cirrhosis: Unclear etiology. No signs of  ascites or SBP.  Acute hepatitis panel and HIV negative. -Continue lactulose as above  Hematochezia: Probably had loose BM with fresh red blood on 8/4.  No further BM.  H&H and coag labs.  -Discontinued aspirin -Recheck CBC at follow-up.  Nausea and vomiting: Resolved.  DM-2 with ESRD: A1c 5.3%.  BMP glucose was in fair range. -Continued home glimepiride  ESRD on HD TTS/bone mineral disorder:  -Per nephrology.  Hematuria?  Patient reported hematuria on 8/5 although he was confused at the time.  No obvious bleeding on exam.  Was not able to obtain urinalysis.  However, he urinalysis on admission was negative for hematuria.  H&H stable.  Essential hypertension: BP within fair range. -Continue metoprolol  Debility/physical deconditioning -Home health PT/OT  Family communication: Updated patient's daughter, Rosemarie Ax on the day of discharge.  Language barrier affecting communication -Video interpreter utilized for encounters  Body mass index is 24.84 kg/m.            Discharge Exam: Vitals:   07/21/20 1226 07/21/20 1647  BP: (!) 138/48 (!) 136/54  Pulse: 61 71  Resp: 18 16  Temp: 98.2 F (36.8 C) 98.6 F (37 C)  SpO2: 95% 93%    GENERAL: Frail looking elderly male.  Nontoxic. HEENT: MMM.  Vision and hearing grossly intact.  NECK: Supple.  No apparent JVD.  RESP:  No IWOB.  Fair aeration bilaterally. CVS:  RRR. Heart sounds normal.  ABD/GI/GU: Bowel sounds present. Soft. Non tender.  MSK/EXT:  Moves extremities. No apparent deformity. No edema.  SKIN: no apparent skin lesion or wound NEURO: Awake and oriented fully except  for date.  No apparent focal neuro deficit. PSYCH: Calm.  Good insight.  Discharge Instructions  Discharge Instructions    Call MD for:  difficulty breathing, headache or visual disturbances   Complete by: As directed    Call MD for:  persistant dizziness or light-headedness   Complete by: As directed    Call MD for:   persistant nausea and vomiting   Complete by: As directed    Call MD for:  temperature >100.4   Complete by: As directed    Diet - low sodium heart healthy   Complete by: As directed    Diet Carb Modified   Complete by: As directed    Discharge instructions   Complete by: As directed    It has been a pleasure taking care of you!  You were hospitalized with confusion likely due to hepatic encephalopathy (elevated ammonia due to liver failure).  We have started you on lactulose, and medication to clear ammonia out of his system and reduce your risk of confusion.  It is very important that you take your lactulose 3-4 times a day for a goal of at least 2 bowel movements a day.   We may have started you on other new medications or made some changes to your home medications during this hospitalization. Please review your new medication list and the directions carefully before you take them.    Please go to your hospital follow-up appointments or call to reschedule as recommended.   Take care,   Increase activity slowly   Complete by: As directed      Allergies as of 07/21/2020   No Known Allergies     Medication List    STOP taking these medications   aspirin 81 MG tablet     TAKE these medications   acetaminophen 325 MG tablet Commonly known as: TYLENOL Take 2 tablets (650 mg total) by mouth every 6 (six) hours as needed for mild pain (or Fever >/= 101).   allopurinol 100 MG tablet Commonly known as: ZYLOPRIM Take 100 mg by mouth daily.   atorvastatin 20 MG tablet Commonly known as: LIPITOR Take 20 mg by mouth daily.   ferrous sulfate 325 (65 FE) MG tablet Take 325 mg by mouth 2 (two) times daily with a meal.   glimepiride 2 MG tablet Commonly known as: AMARYL Take 2 mg by mouth 2 (two) times daily.   hydrALAZINE 50 MG tablet Commonly known as: APRESOLINE Take 50 mg by mouth 3 (three) times daily.   lactulose 10 GM/15ML solution Commonly known as: CHRONULAC Take  20 g (30 mL) 3-4 times a day for a goal of at least 2 bowel movements a day.   lidocaine-prilocaine cream Commonly known as: EMLA Apply 1 application topically daily as needed.   metoprolol succinate 25 MG 24 hr tablet Commonly known as: TOPROL-XL Take 25 mg by mouth daily.   multivitamin Tabs tablet Take 1 tablet by mouth at bedtime.   pantoprazole 40 MG tablet Commonly known as: PROTONIX Take 1 tablet (40 mg total) by mouth daily.   REFRESH OP Place 1 drop into both eyes 2 (two) times daily.   sodium bicarbonate 650 MG tablet Take 650 mg by mouth 2 (two) times daily.   tamsulosin 0.4 MG Caps capsule Commonly known as: FLOMAX Take 1 capsule (0.4 mg total) by mouth daily after supper.   Vitamin D (Cholecalciferol) 25 MCG (1000 UT) Tabs Take 1 tablet by mouth daily.  Consultations:  Nephrology  Procedures/Studies:  Hemodialysis on schedule   DG Chest 1 View  Result Date: 07/18/2020 CLINICAL DATA:  Altered mental status. EXAM: CHEST  1 VIEW COMPARISON:  October 12, 2018. FINDINGS: The heart size and mediastinal contours are within normal limits. Both lungs are clear. The visualized skeletal structures are unremarkable. IMPRESSION: No active disease. Electronically Signed   By: Marijo Conception M.D.   On: 07/18/2020 12:39   CT Head Wo Contrast  Result Date: 07/18/2020 CLINICAL DATA:  Altered mental status EXAM: CT HEAD WITHOUT CONTRAST TECHNIQUE: Contiguous axial images were obtained from the base of the skull through the vertex without intravenous contrast. COMPARISON:  03/29/2017 FINDINGS: Brain: No evidence of acute infarction, hemorrhage, hydrocephalus, extra-axial collection or mass lesion/mass effect. Mild atrophic and ischemic changes are again identified and stable. Vascular: No hyperdense vessel or unexpected calcification. Skull: Normal. Negative for fracture or focal lesion. Sinuses/Orbits: No acute finding. Other: None. IMPRESSION: Chronic atrophic and  ischemic changes without acute abnormality. Electronically Signed   By: Inez Catalina M.D.   On: 07/18/2020 13:26   US Abdomen Limited RUQ  Result Date: 07/18/2020 CLINICAL DATA:  Elevated bilirubin EXAM: ULTRASOUND ABDOMEN LIMITED RIGHT UPPER QUADRANT COMPARISON:  None. FINDINGS: Gallbladder: No gallstones or wall thickening visualized. No sonographic Murphy sign noted by sonographer. Common bile duct: Diameter: 2.9 mm Liver: Liver is increased in echogenicity with nodularity consistent with underlying cirrhosis. No focal mass is noted. Portal vein is patent on color Doppler imaging with normal direction of blood flow towards the liver. Other: None. IMPRESSION: Cirrhotic change of the liver. No other focal abnormality is noted. Electronically Signed   By: Inez Catalina M.D.   On: 07/18/2020 14:42        The results of significant diagnostics from this hospitalization (including imaging, microbiology, ancillary and laboratory) are listed below for reference.     Microbiology: Recent Results (from the past 240 hour(s))  SARS Coronavirus 2 by RT PCR (hospital order, performed in [facility] hospital lab) Nasopharyngeal     Status: None   Collection Time: 07/18/20 12:29 PM   Specimen: Nasopharyngeal  Result Value Ref Range Status   SARS Coronavirus 2 NEGATIVE NEGATIVE Final    Comment: (NOTE) SARS-CoV-2 target nucleic acids are NOT DETECTED.  The SARS-CoV-2 RNA is generally detectable in upper and lower respiratory specimens during the acute phase of infection. The lowest concentration of SARS-CoV-2 viral copies this assay can detect is 250 copies / mL. A negative result does not preclude SARS-CoV-2 infection and should not be used as the sole basis for treatment or other patient management decisions.  A negative result may occur with improper specimen collection / handling, submission of specimen other than nasopharyngeal swab, presence of viral mutation(s) within the areas targeted by  this assay, and inadequate number of viral copies (<250 copies / mL). A negative result must be combined with clinical observations, patient history, and epidemiological information.  Fact Sheet for Patients:   StrictlyIdeas.no  Fact Sheet for Healthcare Providers: BankingDealers.co.za  This test is not yet approved or  cleared by the [place] FDA and has been authorized for detection and/or diagnosis of SARS-CoV-2 by FDA under an Emergency Use Authorization (EUA).  This EUA will remain in effect (meaning this test can be used) for the duration of the COVID-19 declaration under Section 564(b)(1) of the Act, 21 U.S.C. section 360bbb-3(b)(1), unless the authorization is terminated or revoked sooner.  Performed at [facility], [address]  [address] [place], [place] [address]      Labs: BNP (last 3 results) No results for input(s): BNP in the last 8760 hours. Basic Metabolic Panel: Recent Labs  Lab 07/18/20 1207 07/19/20 0416 07/20/20 0501 07/21/20 0801  NA 137 140 138 137  K 3.9 4.3 4.1 3.7  CL 96* 100 99 96*  CO2 28 26 27 29   GLUCOSE 163* 95 93 97  BUN 46* 59* 72* 34*  CREATININE 4.58* 6.18* 8.20* 6.06*  CALCIUM 8.7* 8.3* 8.3* 7.9*  MG 2.0  --  1.9 1.8  PHOS  --   --  5.0* 4.5   Liver Function Tests: Recent Labs  Lab 07/18/20 1207 07/20/20 0501 07/21/20 0801  AST 40 29  --   ALT 26 19  --   ALKPHOS 212* 170*  --   BILITOT 1.9* 1.2  --   PROT 7.2 5.9*  --   ALBUMIN 3.3* 2.8* 2.8*   Recent Labs  Lab 07/18/20 1207  LIPASE 27   Recent Labs  Lab 07/18/20 1229 07/19/20 0416 07/20/20 0501 07/21/20 0801  AMMONIA 61* 63* 54* 45*   CBC: Recent Labs  Lab 07/18/20 1207 07/18/20 1207 07/18/20 1622 07/19/20 0416 07/19/20 1225 07/20/20 0501 07/21/20 0801  WBC 5.3  --  5.0 4.3  --  4.0 3.0*  NEUTROABS 3.5  --   --   --   --   --   --   HGB 13.1   < > 12.4* 12.0* 12.2* 11.6* 11.8*  HCT 38.2*   < >  37.0* 34.2* 36.7* 33.9* 33.9*  MCV 96.2  --  97.6 94.7  --  95.2 95.5  PLT 200  --  193 175  --  178 150   < > = values in this interval not displayed.   Cardiac Enzymes: No results for input(s): CKTOTAL, CKMB, CKMBINDEX, TROPONINI in the last 168 hours. BNP: Invalid input(s): POCBNP CBG: Recent Labs  Lab 07/19/20 0811 07/19/20 1137 07/19/20 1638 07/19/20 2033 07/20/20 0726  GLUCAP 89 127* 121* 125* 96   D-Dimer No results for input(s): DDIMER in the last 72 hours. Hgb A1c No results for input(s): HGBA1C in the last 72 hours. Lipid Profile No results for input(s): CHOL, HDL, LDLCALC, TRIG, CHOLHDL, LDLDIRECT in the last 72 hours. Thyroid function studies Recent Labs    07/19/20 1353  TSH 1.335   Anemia work up Recent Labs    07/19/20 1225  VITAMINB12 230   Urinalysis    Component Value Date/Time   COLORURINE RED (A) 07/20/2020 1851   APPEARANCEUR TURBID (A) 07/20/2020 1851   APPEARANCEUR Turbid 08/29/2013 2049   LABSPEC 1.020 07/20/2020 1851   LABSPEC 1.015 08/29/2013 2049   PHURINE  07/20/2020 1851    TEST NOT REPORTED DUE TO COLOR INTERFERENCE OF URINE PIGMENT   GLUCOSEU (A) 07/20/2020 1851    TEST NOT REPORTED DUE TO COLOR INTERFERENCE OF URINE PIGMENT   GLUCOSEU 50 mg/dL 08/29/2013 2049   HGBUR (A) 07/20/2020 1851    TEST NOT REPORTED DUE TO COLOR INTERFERENCE OF URINE PIGMENT   BILIRUBINUR (A) 07/20/2020 1851    TEST NOT REPORTED DUE TO COLOR INTERFERENCE OF URINE PIGMENT   BILIRUBINUR Negative 08/29/2013 2049   KETONESUR (A) 07/20/2020 1851    TEST NOT REPORTED DUE TO COLOR INTERFERENCE OF URINE PIGMENT   PROTEINUR (A) 07/20/2020 1851    TEST NOT REPORTED DUE TO COLOR INTERFERENCE OF URINE PIGMENT   NITRITE (A) 07/20/2020 1851    TEST  NOT REPORTED DUE TO COLOR INTERFERENCE OF URINE PIGMENT   LEUKOCYTESUR (A) 07/20/2020 1851    TEST NOT REPORTED DUE TO COLOR INTERFERENCE OF URINE PIGMENT   LEUKOCYTESUR 3+ 08/29/2013 2049   Sepsis Labs Invalid  input(s): PROCALCITONIN,  WBC,  LACTICIDVEN   Time coordinating discharge: 45 minutes  SIGNED:  Mercy Riding, MD  [facility] 07/21/2020, 9:19 PM  If 7PM-7AM, please contact night-coverage [email or web address] Password [email or web address]

## 2020-07-21 NOTE — Progress Notes (Signed)
[facility]  ROUNDING NOTE   Subjective:  Patient seen at bedside. Feeling better today. Underwent dialysis treatment yesterday.  Objective:  Vital signs in last 24 hours:  Temp:  [97.7 F (36.5 C)-98.3 F (36.8 C)] 98.2 F (36.8 C) (08/06 1226) Pulse Rate:  [61-79] 61 (08/06 1226) Resp:  [15-18] 18 (08/06 1226) BP: (138-190)/(48-102) 138/48 (08/06 1226) SpO2:  [95 %-99 %] 95 % (08/06 1226)  Weight change:  Filed Weights   07/18/20 1159 07/19/20 0500  Weight: 79.4 kg 69.8 kg    Intake/Output: I/O last 3 completed shifts: In: 120 [P.O.:120] Out: 1500 [Other:1500]   Intake/Output this shift:  No intake/output data recorded.  Physical Exam: General:  No acute distress  Head:  Normocephalic, atraumatic. Moist oral mucosal membranes  Eyes:  Anicteric  Neck:  Supple  Lungs:   Clear to auscultation, normal effort  Heart:  S1S2 no rubs  Abdomen:   Soft, nontender, bowel sounds present  Extremities:  No peripheral edema.  Neurologic:  Awake, alert, following commands  Skin:  No lesions  Access:  Left upper extremity AV fistula    Basic Metabolic Panel: Recent Labs  Lab 07/18/20 1207 07/18/20 1207 07/19/20 0416 07/20/20 0501 07/21/20 0801  NA 137  --  140 138 137  K 3.9  --  4.3 4.1 3.7  CL 96*  --  100 99 96*  CO2 28  --  26 27 29   GLUCOSE 163*  --  95 93 97  BUN 46*  --  59* 72* 34*  CREATININE 4.58*  --  6.18* 8.20* 6.06*  CALCIUM 8.7*   < > 8.3* 8.3* 7.9*  MG 2.0  --   --  1.9 1.8  PHOS  --   --   --  5.0* 4.5   < > = values in this interval not displayed.    Liver Function Tests: Recent Labs  Lab 07/18/20 1207 07/20/20 0501 07/21/20 0801  AST 40 29  --   ALT 26 19  --   ALKPHOS 212* 170*  --   BILITOT 1.9* 1.2  --   PROT 7.2 5.9*  --   ALBUMIN 3.3* 2.8* 2.8*   Recent Labs  Lab 07/18/20 1207  LIPASE 27   Recent Labs  Lab 07/19/20 0416 07/20/20 0501 07/21/20 0801  AMMONIA 63* 54* 45*    CBC: Recent Labs  Lab  07/18/20 1207 07/18/20 1207 07/18/20 1622 07/19/20 0416 07/19/20 1225 07/20/20 0501 07/21/20 0801  WBC 5.3  --  5.0 4.3  --  4.0 3.0*  NEUTROABS 3.5  --   --   --   --   --   --   HGB 13.1   < > 12.4* 12.0* 12.2* 11.6* 11.8*  HCT 38.2*   < > 37.0* 34.2* 36.7* 33.9* 33.9*  MCV 96.2  --  97.6 94.7  --  95.2 95.5  PLT 200  --  193 175  --  178 150   < > = values in this interval not displayed.    Cardiac Enzymes: No results for input(s): CKTOTAL, CKMB, CKMBINDEX, TROPONINI in the last 168 hours.  BNP: Invalid input(s): POCBNP  CBG: Recent Labs  Lab 07/19/20 0811 07/19/20 1137 07/19/20 1638 07/19/20 2033 07/20/20 0726  GLUCAP 89 127* 121* 125* 96    Microbiology: Results for orders placed or performed during the hospital encounter of 07/18/20  SARS Coronavirus 2 by RT PCR (hospital order, performed in [facility] hospital lab) Nasopharyngeal  Status: None   Collection Time: 07/18/20 12:29 PM   Specimen: Nasopharyngeal  Result Value Ref Range Status   SARS Coronavirus 2 NEGATIVE NEGATIVE Final    Comment: (NOTE) SARS-CoV-2 target nucleic acids are NOT DETECTED.  The SARS-CoV-2 RNA is generally detectable in upper and lower respiratory specimens during the acute phase of infection. The lowest concentration of SARS-CoV-2 viral copies this assay can detect is 250 copies / mL. A negative result does not preclude SARS-CoV-2 infection and should not be used as the sole basis for treatment or other patient management decisions.  A negative result may occur with improper specimen collection / handling, submission of specimen other than nasopharyngeal swab, presence of viral mutation(s) within the areas targeted by this assay, and inadequate number of viral copies (<250 copies / mL). A negative result must be combined with clinical observations, patient history, and epidemiological information.  Fact Sheet for Patients:   StrictlyIdeas.no  Fact  Sheet for Healthcare Providers: BankingDealers.co.za  This test is not yet approved or  cleared by the [place] FDA and has been authorized for detection and/or diagnosis of SARS-CoV-2 by FDA under an Emergency Use Authorization (EUA).  This EUA will remain in effect (meaning this test can be used) for the duration of the COVID-19 declaration under Section 564(b)(1) of the Act, 21 U.S.C. section 360bbb-3(b)(1), unless the authorization is terminated or revoked sooner.  Performed at [facility], [address] [place], [place] [address]     Coagulation Studies: Recent Labs    07/19/20 1225  LABPROT 13.5  INR 1.1    Urinalysis: Recent Labs    07/20/20 1851  COLORURINE RED*  LABSPEC 1.020  PHURINE TEST NOT REPORTED DUE TO COLOR INTERFERENCE OF URINE PIGMENT  GLUCOSEU TEST NOT REPORTED DUE TO COLOR INTERFERENCE OF URINE PIGMENT*  HGBUR TEST NOT REPORTED DUE TO COLOR INTERFERENCE OF URINE PIGMENT*  BILIRUBINUR TEST NOT REPORTED DUE TO COLOR INTERFERENCE OF URINE PIGMENT*  KETONESUR TEST NOT REPORTED DUE TO COLOR INTERFERENCE OF URINE PIGMENT*  PROTEINUR TEST NOT REPORTED DUE TO COLOR INTERFERENCE OF URINE PIGMENT*  NITRITE TEST NOT REPORTED DUE TO COLOR INTERFERENCE OF URINE PIGMENT*  LEUKOCYTESUR TEST NOT REPORTED DUE TO COLOR INTERFERENCE OF URINE PIGMENT*      Imaging: No results found.   Medications:    . allopurinol  100 mg Oral Daily  . atorvastatin  20 mg Oral Daily  . Chlorhexidine Gluconate Cloth  6 each Topical Daily  . ferrous sulfate  325 mg Oral BID WC  . hydrALAZINE  50 mg Oral TID  . lactulose  30 g Oral TID  . metoprolol succinate  25 mg Oral Daily  . pantoprazole  40 mg Oral Daily  . rifaximin  200 mg Oral BID  . sodium bicarbonate  650 mg Oral BID  . tamsulosin  0.4 mg Oral QPC supper   acetaminophen, QUEtiapine  Assessment/ Plan:  79 y.o. male with past medical history of ESRD on HD TTS, diabetes  mellitus type 2, GERD, hypertension, hyperlipidemia, BPH, osteoarthritis, anemia of chronic kidney disease, secondary hyperparathyroidism who was admitted with altered mental status.  [facility]/L[facility] AVF  1.  ESRD on HD TTS.  Patient completed dialysis treatment yesterday.  Tolerated well.  No acute indication for dialysis treatment today.  2.  Anemia of chronic kidney disease.  Hemoglobin 11.8.  Continue to hold Epogen for now.  3.  Secondary hyperparathyroidism.  Phosphorus at target of 4.5.  No acute indication for binders  at the moment.  4.  Hypertension.  Continue metoprolol and hydralazine.   LOS: 3 Aubri Gathright 8/6/20211:23 PM

## 2020-09-11 ENCOUNTER — Encounter (INDEPENDENT_AMBULATORY_CARE_PROVIDER_SITE_OTHER): Payer: Medicare Other

## 2020-09-11 ENCOUNTER — Ambulatory Visit (INDEPENDENT_AMBULATORY_CARE_PROVIDER_SITE_OTHER): Payer: Medicare Other | Admitting: Nurse Practitioner

## 2020-09-13 ENCOUNTER — Other Ambulatory Visit (INDEPENDENT_AMBULATORY_CARE_PROVIDER_SITE_OTHER): Payer: Self-pay | Admitting: Nurse Practitioner

## 2020-09-13 DIAGNOSIS — N186 End stage renal disease: Secondary | ICD-10-CM

## 2020-09-18 ENCOUNTER — Ambulatory Visit (INDEPENDENT_AMBULATORY_CARE_PROVIDER_SITE_OTHER): Payer: Medicare Other

## 2020-09-18 ENCOUNTER — Encounter (INDEPENDENT_AMBULATORY_CARE_PROVIDER_SITE_OTHER): Payer: Self-pay | Admitting: Nurse Practitioner

## 2020-09-18 ENCOUNTER — Ambulatory Visit (INDEPENDENT_AMBULATORY_CARE_PROVIDER_SITE_OTHER): Payer: Medicare Other | Admitting: Nurse Practitioner

## 2020-09-18 ENCOUNTER — Other Ambulatory Visit: Payer: Self-pay

## 2020-09-18 VITALS — BP 156/61 | HR 58 | Resp 16 | Wt 159.0 lb

## 2020-09-18 DIAGNOSIS — N186 End stage renal disease: Secondary | ICD-10-CM | POA: Diagnosis not present

## 2020-09-18 DIAGNOSIS — Z992 Dependence on renal dialysis: Secondary | ICD-10-CM | POA: Diagnosis not present

## 2020-09-18 DIAGNOSIS — I10 Essential (primary) hypertension: Secondary | ICD-10-CM

## 2020-09-18 NOTE — Progress Notes (Signed)
Subjective:    Patient ID: Martin Gilbert, male    DOB: 10-27-1941, 79 y.o.   MRN: [id number] Chief Complaint  Patient presents with  . Follow-up    35month ultrasound follow up    The patient returns to the office for followup of their dialysis access. The function of the access has been stable. The patient denies increased bleeding time or increased recirculation. Patient denies difficulty with cannulation. The patient denies hand pain or other symptoms consistent with steal phenomena.  No significant arm swelling.  He denies any elevated pressures during dialysis.  The patient denies redness or swelling at the access site. The patient denies fever or chills at home or while on dialysis.  The patient denies amaurosis fugax or recent TIA symptoms. There are no recent neurological changes noted. The patient denies claudication symptoms or rest pain symptoms. The patient denies history of DVT, PE or superficial thrombophlebitis. The patient denies recent episodes of angina or shortness of breath.   The patient has a flow volume of 1114.  He denies any issues currently with dialysis there is evidence of an elevated velocity in the distal forearm.       Review of Systems  Neurological: Positive for weakness.  Hematological: Bruises/bleeds easily.  All other systems reviewed and are negative.      Objective:   Physical Exam Vitals reviewed.  HENT:     Head: Normocephalic.  Cardiovascular:     Rate and Rhythm: Normal rate and regular rhythm.     Pulses: Normal pulses.     Arteriovenous access: left arteriovenous access is present.    Comments: Good thrill and bruit in radial cephalic AVF  Pulmonary:     Effort: Pulmonary effort is normal.  Musculoskeletal:        General: Normal range of motion.  Skin:    General: Skin is warm and dry.     Capillary Refill: Capillary refill takes less than 2 seconds.  Neurological:     Mental Status: He is alert and oriented to  person, place, and time.     Motor: Weakness present.     Gait: Gait abnormal.  Psychiatric:        Mood and Affect: Mood normal.        Behavior: Behavior normal.        Thought Content: Thought content normal.        Judgment: Judgment normal.     BP (!) 156/61 (BP Location: Right Arm)   Pulse (!) 58   Resp 16   Wt 159 lb (72.1 kg)   BMI 25.66 kg/m   Past Medical History:  Diagnosis Date  . Anemia   . Arthritis   . Back pain   . BPH (benign prostatic hyperplasia)   . CKD (chronic kidney disease), stage IV ([place])   . Clostridium difficile colitis   . Diabetes mellitus without complication ([place])   . GERD (gastroesophageal reflux disease)   . HTN (hypertension)   . Hyperlipidemia     Social History   Socioeconomic History  . Marital status: Married    Spouse name: Not on file  . Number of children: Not on file  . Years of education: Not on file  . Highest education level: Not on file  Occupational History  . Occupation: retired  Tobacco Use  . Smoking status: Never Smoker  . Smokeless tobacco: Never Used  Vaping Use  . Vaping Use: Never used  Substance and Sexual Activity  .  Alcohol use: No    Comment: Drank in the past, but has stopped for several years.   . Drug use: No  . Sexual activity: Not on file  Other Topics Concern  . Not on file  Social History Narrative  . Not on file   Social Determinants of Health   Financial Resource Strain:   . Difficulty of Paying Living Expenses: Not on file  Food Insecurity:   . Worried About Charity fundraiser in the Last Year: Not on file  . Ran Out of Food in the Last Year: Not on file  Transportation Needs:   . Lack of Transportation (Medical): Not on file  . Lack of Transportation (Non-Medical): Not on file  Physical Activity:   . Days of Exercise per Week: Not on file  . Minutes of Exercise per Session: Not on file  Stress:   . Feeling of Stress : Not on file  Social Connections:   . Frequency of  Communication with Friends and Family: Not on file  . Frequency of Social Gatherings with Friends and Family: Not on file  . Attends Religious Services: Not on file  . Active Member of Clubs or Organizations: Not on file  . Attends Archivist Meetings: Not on file  . Marital Status: Not on file  Intimate Partner Violence:   . Fear of Current or Ex-Partner: Not on file  . Emotionally Abused: Not on file  . Physically Abused: Not on file  . Sexually Abused: Not on file    Past Surgical History:  Procedure Laterality Date  . A/V FISTULAGRAM Left 04/20/2018   Procedure: A/V FISTULAGRAM;  Surgeon: Algernon Huxley, MD;  Location: [facility] [facility];  Service: Cardiovascular;  Laterality: Left;  . A/V FISTULAGRAM Left 11/25/2018   Procedure: A/V FISTULAGRAM;  Surgeon: Algernon Huxley, MD;  Location: [place] [facility];  Service: Cardiovascular;  Laterality: Left;  . A/V FISTULAGRAM Left 01/11/2019   Procedure: A/V FISTULAGRAM;  Surgeon: Algernon Huxley, MD;  Location: [place] [facility];  Service: Cardiovascular;  Laterality: Left;  . AV FISTULA PLACEMENT Left 06/13/2017   Procedure: ARTERIOVENOUS (AV) FISTULA CREATION ( RADIAL CEPHALIC );  Surgeon: Katha Cabal, MD;  Location: [facility];  Service: Vascular;  Laterality: Left;  . CATARACT EXTRACTION     one eye not sure which  . DIALYSIS/PERMA CATHETER INSERTION N/A 04/03/2017   Procedure: Dialysis/Perma Catheter Insertion;  Surgeon: Algernon Huxley, MD;  Location: [place] [facility];  Service: Cardiovascular;  Laterality: N/A;  . DIALYSIS/PERMA CATHETER INSERTION N/A 08/06/2017   Procedure: DIALYSIS/PERMA CATHETER INSERTION;  Surgeon: Algernon Huxley, MD;  Location: [place] [facility];  Service: Cardiovascular;  Laterality: N/A;  . DIALYSIS/PERMA CATHETER REMOVAL N/A 08/04/2017   Procedure: DIALYSIS/PERMA CATHETER REMOVAL;  Surgeon: Algernon Huxley, MD;  Location: [place] [facility];  Service: Cardiovascular;  Laterality: N/A;  .  DIALYSIS/PERMA CATHETER REMOVAL N/A 12/31/2017   Procedure: DIALYSIS/PERMA CATHETER REMOVAL;  Surgeon: Algernon Huxley, MD;  Location: [place] [facility];  Service: Cardiovascular;  Laterality: N/A;  . hernia repair    . SACROPLASTY N/A 09/30/2018   Procedure: SACROPLASTY;  Surgeon: Hessie Knows, MD;  Location: [facility];  Service: Orthopedics;  Laterality: N/A;  . UPPER EXTREMITY ANGIOGRAPHY Left 09/05/2017   Procedure: Upper Extremity Angiography;  Surgeon: Katha Cabal, MD;  Location: [place] [facility];  Service: Cardiovascular;  Laterality: Left;    Family History  Problem Relation  Age of Onset  . Hypertension Mother   . Diabetes Neg Hx     No Known Allergies     Assessment & Plan:   1. ESRD on dialysis [facility]) Recommend:  The patient is doing well and currently has adequate dialysis access. The patient's dialysis center is not reporting any access issues. Flow pattern is stable when compared to the prior ultrasound.  The patient should have a duplex ultrasound of the dialysis access in 6 months. The patient will follow-up with me in the office after each ultrasound     2. Essential hypertension Continue antihypertensive medications as already ordered, these medications have been reviewed and there are no changes at this time.    Current Outpatient Medications on File Prior to Visit  Medication Sig Dispense Refill  . acetaminophen (TYLENOL) 325 MG tablet Take 2 tablets (650 mg total) by mouth every 6 (six) hours as needed for mild pain (or Fever >/= 101).    Marland Kitchen allopurinol (ZYLOPRIM) 100 MG tablet Take 100 mg by mouth daily.    Marland Kitchen atorvastatin (LIPITOR) 20 MG tablet Take 20 mg by mouth daily.    . ferrous sulfate 325 (65 FE) MG tablet Take 325 mg by mouth 2 (two) times daily with a meal.   11  . glimepiride (AMARYL) 2 MG tablet Take 2 mg by mouth 2 (two) times daily.     . hydrALAZINE (APRESOLINE) 50 MG tablet Take 50 mg by mouth 3 (three) times daily.    Marland Kitchen  lactulose (CHRONULAC) 10 GM/15ML solution Take 20 g (30 mL) 3-4 times a day for a goal of at least 2 bowel movements a day. 946 mL 0  . lidocaine-prilocaine (EMLA) cream Apply 1 application topically daily as needed.    . metoprolol succinate (TOPROL-XL) 25 MG 24 hr tablet Take 25 mg by mouth daily.    . multivitamin (RENA-VIT) TABS tablet Take 1 tablet by mouth at bedtime.  0  . pantoprazole (PROTONIX) 40 MG tablet Take 1 tablet (40 mg total) by mouth daily. 30 tablet 6  . Polyvinyl Alcohol-Povidone (REFRESH OP) Place 1 drop into both eyes 2 (two) times daily.    . sodium bicarbonate 650 MG tablet Take 650 mg by mouth 2 (two) times daily.    . tamsulosin (FLOMAX) 0.4 MG CAPS capsule Take 1 capsule (0.4 mg total) by mouth daily after supper. 30 capsule 0  . Vitamin D, Cholecalciferol, 1000 UNITS TABS Take 1 tablet by mouth daily.      No current facility-administered medications on file prior to visit.    There are no Patient Instructions on file for this visit. No follow-ups on file.   Kris Hartmann, NP

## 2021-03-19 ENCOUNTER — Other Ambulatory Visit (INDEPENDENT_AMBULATORY_CARE_PROVIDER_SITE_OTHER): Payer: Self-pay | Admitting: Vascular Surgery

## 2021-03-19 DIAGNOSIS — N186 End stage renal disease: Secondary | ICD-10-CM

## 2021-03-21 ENCOUNTER — Encounter (INDEPENDENT_AMBULATORY_CARE_PROVIDER_SITE_OTHER): Payer: Medicare Other

## 2021-03-21 ENCOUNTER — Ambulatory Visit (INDEPENDENT_AMBULATORY_CARE_PROVIDER_SITE_OTHER): Payer: Medicare Other | Admitting: Nurse Practitioner

## 2021-04-13 ENCOUNTER — Other Ambulatory Visit: Payer: Self-pay

## 2021-04-13 DIAGNOSIS — N401 Enlarged prostate with lower urinary tract symptoms: Secondary | ICD-10-CM | POA: Diagnosis present

## 2021-04-13 DIAGNOSIS — Z79899 Other long term (current) drug therapy: Secondary | ICD-10-CM

## 2021-04-13 DIAGNOSIS — E860 Dehydration: Secondary | ICD-10-CM | POA: Diagnosis present

## 2021-04-13 DIAGNOSIS — Z992 Dependence on renal dialysis: Secondary | ICD-10-CM

## 2021-04-13 DIAGNOSIS — I120 Hypertensive chronic kidney disease with stage 5 chronic kidney disease or end stage renal disease: Secondary | ICD-10-CM | POA: Diagnosis present

## 2021-04-13 DIAGNOSIS — G8929 Other chronic pain: Secondary | ICD-10-CM | POA: Diagnosis present

## 2021-04-13 DIAGNOSIS — A0811 Acute gastroenteropathy due to Norwalk agent: Secondary | ICD-10-CM | POA: Diagnosis not present

## 2021-04-13 DIAGNOSIS — N2581 Secondary hyperparathyroidism of renal origin: Secondary | ICD-10-CM | POA: Diagnosis present

## 2021-04-13 DIAGNOSIS — Z8249 Family history of ischemic heart disease and other diseases of the circulatory system: Secondary | ICD-10-CM

## 2021-04-13 DIAGNOSIS — Z597 Insufficient social insurance and welfare support: Secondary | ICD-10-CM

## 2021-04-13 DIAGNOSIS — E1151 Type 2 diabetes mellitus with diabetic peripheral angiopathy without gangrene: Secondary | ICD-10-CM | POA: Diagnosis present

## 2021-04-13 DIAGNOSIS — K529 Noninfective gastroenteritis and colitis, unspecified: Secondary | ICD-10-CM | POA: Diagnosis not present

## 2021-04-13 DIAGNOSIS — E1122 Type 2 diabetes mellitus with diabetic chronic kidney disease: Secondary | ICD-10-CM | POA: Diagnosis present

## 2021-04-13 DIAGNOSIS — K30 Functional dyspepsia: Secondary | ICD-10-CM | POA: Diagnosis present

## 2021-04-13 DIAGNOSIS — Z20822 Contact with and (suspected) exposure to covid-19: Secondary | ICD-10-CM | POA: Diagnosis present

## 2021-04-13 DIAGNOSIS — M549 Dorsalgia, unspecified: Secondary | ICD-10-CM | POA: Diagnosis present

## 2021-04-13 DIAGNOSIS — M1A9XX0 Chronic gout, unspecified, without tophus (tophi): Secondary | ICD-10-CM | POA: Diagnosis present

## 2021-04-13 DIAGNOSIS — N186 End stage renal disease: Secondary | ICD-10-CM | POA: Diagnosis present

## 2021-04-13 DIAGNOSIS — D631 Anemia in chronic kidney disease: Secondary | ICD-10-CM | POA: Diagnosis present

## 2021-04-13 DIAGNOSIS — Z9115 Patient's noncompliance with renal dialysis: Secondary | ICD-10-CM

## 2021-04-13 DIAGNOSIS — I959 Hypotension, unspecified: Secondary | ICD-10-CM | POA: Diagnosis present

## 2021-04-13 DIAGNOSIS — E785 Hyperlipidemia, unspecified: Secondary | ICD-10-CM | POA: Diagnosis present

## 2021-04-13 DIAGNOSIS — E1165 Type 2 diabetes mellitus with hyperglycemia: Secondary | ICD-10-CM | POA: Diagnosis present

## 2021-04-13 DIAGNOSIS — K219 Gastro-esophageal reflux disease without esophagitis: Secondary | ICD-10-CM | POA: Diagnosis present

## 2021-04-13 LAB — COMPREHENSIVE METABOLIC PANEL
ALT: 24 U/L (ref 0–44)
AST: 23 U/L (ref 15–41)
Albumin: 3.5 g/dL (ref 3.5–5.0)
Alkaline Phosphatase: 214 U/L — ABNORMAL HIGH (ref 38–126)
Anion gap: 19 — ABNORMAL HIGH (ref 5–15)
BUN: 79 mg/dL — ABNORMAL HIGH (ref 8–23)
CO2: 16 mmol/L — ABNORMAL LOW (ref 22–32)
Calcium: 9.3 mg/dL (ref 8.9–10.3)
Chloride: 100 mmol/L (ref 98–111)
Creatinine, Ser: 9.45 mg/dL — ABNORMAL HIGH (ref 0.61–1.24)
GFR, Estimated: 5 mL/min — ABNORMAL LOW (ref >60)
Glucose, Bld: 150 mg/dL — ABNORMAL HIGH (ref 70–99)
Potassium: 4.1 mmol/L (ref 3.5–5.1)
Sodium: 135 mmol/L (ref 135–145)
Total Bilirubin: 1.4 mg/dL — ABNORMAL HIGH (ref 0.3–1.2)
Total Protein: 7.7 g/dL (ref 6.5–8.1)

## 2021-04-13 LAB — CBC
HCT: 39.9 % (ref 39.0–52.0)
Hemoglobin: 13.4 g/dL (ref 13.0–17.0)
MCH: 32.4 pg (ref 26.0–34.0)
MCHC: 33.6 g/dL (ref 30.0–36.0)
MCV: 96.4 fL (ref 80.0–100.0)
Platelets: 221 10*3/uL (ref 150–400)
RBC: 4.14 MIL/uL — ABNORMAL LOW (ref 4.22–5.81)
RDW: 14.4 % (ref 11.5–15.5)
WBC: 14.8 10*3/uL — ABNORMAL HIGH (ref 4.0–10.5)
nRBC: 0 % (ref 0.0–0.2)

## 2021-04-13 LAB — LIPASE, BLOOD: Lipase: 30 U/L (ref 11–51)

## 2021-04-13 MED ORDER — SODIUM CHLORIDE 0.9 % IV BOLUS
1000.0000 mL | Freq: Once | INTRAVENOUS | Status: AC
  Administered 2021-04-13: 1000 mL via INTRAVENOUS

## 2021-04-13 NOTE — ED Triage Notes (Signed)
Pt c/o abdominal pain, headache, N/D, denies emesis x1 week. Pt is AOX4, appears uncomfortable. Pt took tylenol at afternoon. Pt reports decreased. Pt denies dizziness, SOB, CP. Pt reports fatigue.

## 2021-04-13 NOTE — ED Notes (Signed)
1 liter NaCl bolus started per verbal order of Joni Fears, MD

## 2021-04-14 ENCOUNTER — Inpatient Hospital Stay
Admission: EM | Admit: 2021-04-14 | Discharge: 2021-04-16 | DRG: 391 | Disposition: A | Discharge status: Home Health | Payer: Medicare Other | Attending: Internal Medicine | Admitting: Internal Medicine

## 2021-04-14 ENCOUNTER — Emergency Department: Payer: Medicare Other

## 2021-04-14 DIAGNOSIS — K219 Gastro-esophageal reflux disease without esophagitis: Secondary | ICD-10-CM | POA: Diagnosis present

## 2021-04-14 DIAGNOSIS — M549 Dorsalgia, unspecified: Secondary | ICD-10-CM | POA: Diagnosis present

## 2021-04-14 DIAGNOSIS — M1A9XX0 Chronic gout, unspecified, without tophus (tophi): Secondary | ICD-10-CM | POA: Diagnosis present

## 2021-04-14 DIAGNOSIS — A0811 Acute gastroenteropathy due to Norwalk agent: Secondary | ICD-10-CM | POA: Diagnosis present

## 2021-04-14 DIAGNOSIS — E860 Dehydration: Secondary | ICD-10-CM | POA: Diagnosis present

## 2021-04-14 DIAGNOSIS — K30 Functional dyspepsia: Secondary | ICD-10-CM | POA: Diagnosis present

## 2021-04-14 DIAGNOSIS — Z992 Dependence on renal dialysis: Secondary | ICD-10-CM

## 2021-04-14 DIAGNOSIS — Z597 Insufficient social insurance and welfare support: Secondary | ICD-10-CM | POA: Diagnosis not present

## 2021-04-14 DIAGNOSIS — Z79899 Other long term (current) drug therapy: Secondary | ICD-10-CM | POA: Diagnosis not present

## 2021-04-14 DIAGNOSIS — G8929 Other chronic pain: Secondary | ICD-10-CM | POA: Diagnosis present

## 2021-04-14 DIAGNOSIS — E1122 Type 2 diabetes mellitus with diabetic chronic kidney disease: Secondary | ICD-10-CM | POA: Diagnosis present

## 2021-04-14 DIAGNOSIS — N401 Enlarged prostate with lower urinary tract symptoms: Secondary | ICD-10-CM | POA: Diagnosis present

## 2021-04-14 DIAGNOSIS — I10 Essential (primary) hypertension: Secondary | ICD-10-CM

## 2021-04-14 DIAGNOSIS — IMO0002 Reserved for concepts with insufficient information to code with codable children: Secondary | ICD-10-CM | POA: Diagnosis present

## 2021-04-14 DIAGNOSIS — N186 End stage renal disease: Secondary | ICD-10-CM | POA: Diagnosis present

## 2021-04-14 DIAGNOSIS — E1151 Type 2 diabetes mellitus with diabetic peripheral angiopathy without gangrene: Secondary | ICD-10-CM | POA: Diagnosis present

## 2021-04-14 DIAGNOSIS — K529 Noninfective gastroenteritis and colitis, unspecified: Secondary | ICD-10-CM | POA: Diagnosis present

## 2021-04-14 DIAGNOSIS — A0472 Enterocolitis due to Clostridium difficile, not specified as recurrent: Secondary | ICD-10-CM | POA: Diagnosis not present

## 2021-04-14 DIAGNOSIS — I959 Hypotension, unspecified: Secondary | ICD-10-CM | POA: Diagnosis present

## 2021-04-14 DIAGNOSIS — Z20822 Contact with and (suspected) exposure to covid-19: Secondary | ICD-10-CM | POA: Diagnosis present

## 2021-04-14 DIAGNOSIS — E785 Hyperlipidemia, unspecified: Secondary | ICD-10-CM | POA: Diagnosis present

## 2021-04-14 DIAGNOSIS — D631 Anemia in chronic kidney disease: Secondary | ICD-10-CM | POA: Diagnosis present

## 2021-04-14 DIAGNOSIS — N2581 Secondary hyperparathyroidism of renal origin: Secondary | ICD-10-CM | POA: Diagnosis present

## 2021-04-14 DIAGNOSIS — Z8249 Family history of ischemic heart disease and other diseases of the circulatory system: Secondary | ICD-10-CM | POA: Diagnosis not present

## 2021-04-14 DIAGNOSIS — D638 Anemia in other chronic diseases classified elsewhere: Secondary | ICD-10-CM | POA: Diagnosis not present

## 2021-04-14 DIAGNOSIS — I120 Hypertensive chronic kidney disease with stage 5 chronic kidney disease or end stage renal disease: Secondary | ICD-10-CM | POA: Diagnosis present

## 2021-04-14 DIAGNOSIS — E1165 Type 2 diabetes mellitus with hyperglycemia: Secondary | ICD-10-CM | POA: Diagnosis present

## 2021-04-14 DIAGNOSIS — Z9115 Patient's noncompliance with renal dialysis: Secondary | ICD-10-CM | POA: Diagnosis not present

## 2021-04-14 LAB — CREATININE, SERUM
Creatinine, Ser: 10.84 mg/dL — ABNORMAL HIGH (ref 0.61–1.24)
GFR, Estimated: 4 mL/min — ABNORMAL LOW (ref >60)

## 2021-04-14 LAB — RESP PANEL BY RT-PCR (FLU A&B, COVID) ARPGX2
Influenza A by PCR: NEGATIVE
Influenza B by PCR: NEGATIVE
SARS Coronavirus 2 by RT PCR: NEGATIVE

## 2021-04-14 LAB — CBC
HCT: 36.5 % — ABNORMAL LOW (ref 39.0–52.0)
Hemoglobin: 12.2 g/dL — ABNORMAL LOW (ref 13.0–17.0)
MCH: 32.5 pg (ref 26.0–34.0)
MCHC: 33.4 g/dL (ref 30.0–36.0)
MCV: 97.3 fL (ref 80.0–100.0)
Platelets: 172 10*3/uL (ref 150–400)
RBC: 3.75 MIL/uL — ABNORMAL LOW (ref 4.22–5.81)
RDW: 14.3 % (ref 11.5–15.5)
WBC: 14.9 10*3/uL — ABNORMAL HIGH (ref 4.0–10.5)
nRBC: 0 % (ref 0.0–0.2)

## 2021-04-14 LAB — MRSA PCR SCREENING: MRSA by PCR: NEGATIVE

## 2021-04-14 LAB — LACTIC ACID, PLASMA
Lactic Acid, Venous: 1.3 mmol/L (ref 0.5–1.9)
Lactic Acid, Venous: 1.2 mmol/L (ref 0.5–1.9)

## 2021-04-14 LAB — PROCALCITONIN: Procalcitonin: 4.1 ng/mL

## 2021-04-14 MED ORDER — LACTATED RINGERS IV BOLUS
500.0000 mL | Freq: Once | INTRAVENOUS | Status: AC
  Administered 2021-04-14: 500 mL via INTRAVENOUS

## 2021-04-14 MED ORDER — ACETAMINOPHEN 325 MG PO TABS
650.0000 mg | ORAL_TABLET | Freq: Four times a day (QID) | ORAL | Status: DC | PRN
  Filled 2021-04-14: qty 2

## 2021-04-14 MED ORDER — HEPARIN SODIUM (PORCINE) 5000 UNIT/ML IJ SOLN
5000.0000 [IU] | Freq: Three times a day (TID) | INTRAMUSCULAR | Status: DC
  Administered 2021-04-14 – 2021-04-16 (×6): 5000 [IU] via SUBCUTANEOUS
  Filled 2021-04-14 (×6): qty 1

## 2021-04-14 MED ORDER — ATORVASTATIN CALCIUM 10 MG PO TABS
10.0000 mg | ORAL_TABLET | Freq: Every day | ORAL | Status: DC
  Administered 2021-04-14 – 2021-04-15 (×2): 10 mg via ORAL
  Filled 2021-04-14 (×2): qty 1

## 2021-04-14 MED ORDER — SODIUM BICARBONATE 650 MG PO TABS
650.0000 mg | ORAL_TABLET | Freq: Every day | ORAL | Status: DC
  Administered 2021-04-14 – 2021-04-15 (×2): 650 mg via ORAL
  Filled 2021-04-14 (×2): qty 1

## 2021-04-14 MED ORDER — IOHEXOL 300 MG/ML  SOLN
100.0000 mL | Freq: Once | INTRAMUSCULAR | Status: AC | PRN
  Administered 2021-04-14: 100 mL via INTRAVENOUS

## 2021-04-14 MED ORDER — PIPERACILLIN-TAZOBACTAM 3.375 G IVPB 30 MIN
3.3750 g | Freq: Once | INTRAVENOUS | Status: AC
  Administered 2021-04-14: 3.375 g via INTRAVENOUS
  Filled 2021-04-14 (×2): qty 50

## 2021-04-14 MED ORDER — SODIUM CHLORIDE 0.9 % IV SOLN
2.2500 g | Freq: Three times a day (TID) | INTRAVENOUS | Status: DC
  Administered 2021-04-14 – 2021-04-15 (×2): 2.25 g via INTRAVENOUS
  Filled 2021-04-14 (×4): qty 10

## 2021-04-14 NOTE — ED Notes (Signed)
Unable to obtain blood cultures at this time after 2 attempts. Will call lab to draw cultures.

## 2021-04-14 NOTE — ED Notes (Signed)
Per [place] in lab, will send someone to draw cultures and procalcitonin

## 2021-04-14 NOTE — ED Provider Notes (Signed)
[facility] Emergency Department Provider Note  ____________________________________________  Time seen: Approximately 6:36 AM  I have reviewed the triage vital signs and the nursing notes.   HISTORY  Chief Complaint Abdominal Pain   HPI Martin Gilbert is a 80 y.o. male history of ESRD on HD (TTS), diabetes, C. difficile colitis, hypertension, hyperlipidemia who presents for evaluation of abdominal pain.  Patient has had 7 days of right lower quadrant abdominal pain with several episodes of watery diarrhea.  No fever or chills.   He has had nausea but no vomiting.  The pain this evening became severe.  No chest pain or shortness of breath, no lightheadedness.  Past Medical History:  Diagnosis Date  . Anemia   . Arthritis   . Back pain   . BPH (benign prostatic hyperplasia)   . CKD (chronic kidney disease), stage IV ([place])   . Clostridium difficile colitis   . Diabetes mellitus without complication ([place])   . GERD (gastroesophageal reflux disease)   . HTN (hypertension)   . Hyperlipidemia     Patient Active Problem List   Diagnosis Date Noted  . Acute metabolic encephalopathy 123456  . Acute gastroenteritis 10/11/2018  . Intractable nausea and vomiting 10/10/2018  . Sacral fracture ([place]) 09/29/2018  . Complication of arteriovenous dialysis fistula 09/01/2017  . ESRD on dialysis ([place]) 06/03/2017  . Colitis 03/27/2017  . Demand ischemia ([place]) 03/27/2017  . Enteritis due to Clostridium difficile   . Protein-calorie malnutrition, severe 02/18/2017  . C. difficile colitis 02/15/2017  . UTI (urinary tract infection) 02/02/2017  . E coli infection 02/02/2017  . Lactic acidosis 02/02/2017  . Diabetic neuropathy ([place]) 02/02/2017  . Left-sided chest wall pain 02/02/2017  . Fall 02/02/2017  . Generalized weakness 02/02/2017  . Pink eye, left 02/02/2017  . Leukocytosis 02/02/2017  . Anemia of chronic disease 02/02/2017  . Nausea and  vomiting 02/02/2017  . Sepsis ([place]) 01/31/2017  . Foot pain 10/08/2016  . Osteoarthritis of knee 10/08/2016  . Essential hypertension 11/28/2015  . Chronic gout due to renal impairment, right ankle and foot, without tophus (tophi) 07/31/2015  . Bone lesion 06/05/2015  . Primary osteoarthritis of both knees 06/05/2015  . Pain in the chest 02/15/2015  . Neck pain 02/15/2015  . Diabetes type 2, uncontrolled ([place]) 02/15/2015  . Frequent headaches 02/15/2015  . Type 2 diabetes mellitus with hyperglycemia ([place]) 02/15/2015  . Hypertrophy of prostate with urinary obstruction and other lower urinary tract symptoms (LUTS) 09/07/2013    Past Surgical History:  Procedure Laterality Date  . A/V FISTULAGRAM Left 04/20/2018   Procedure: A/V FISTULAGRAM;  Surgeon: Algernon Huxley, MD;  Location: [place] [facility];  Service: Cardiovascular;  Laterality: Left;  . A/V FISTULAGRAM Left 11/25/2018   Procedure: A/V FISTULAGRAM;  Surgeon: Algernon Huxley, MD;  Location: [place] [facility];  Service: Cardiovascular;  Laterality: Left;  . A/V FISTULAGRAM Left 01/11/2019   Procedure: A/V FISTULAGRAM;  Surgeon: Algernon Huxley, MD;  Location: [place] [facility];  Service: Cardiovascular;  Laterality: Left;  . AV FISTULA PLACEMENT Left 06/13/2017   Procedure: ARTERIOVENOUS (AV) FISTULA CREATION ( RADIAL CEPHALIC );  Surgeon: Katha Cabal, MD;  Location: [facility];  Service: Vascular;  Laterality: Left;  . CATARACT EXTRACTION     one eye not sure which  . DIALYSIS/PERMA CATHETER INSERTION N/A 04/03/2017   Procedure: Dialysis/Perma Catheter Insertion;  Surgeon: Algernon Huxley, MD;  Location: [place] [facility];  Service: Cardiovascular;  Laterality: N/A;  . DIALYSIS/PERMA CATHETER INSERTION N/A 08/06/2017   Procedure: DIALYSIS/PERMA CATHETER INSERTION;  Surgeon: Algernon Huxley, MD;  Location: [place] [facility];  Service: Cardiovascular;  Laterality: N/A;  . DIALYSIS/PERMA CATHETER REMOVAL N/A 08/04/2017   Procedure:  DIALYSIS/PERMA CATHETER REMOVAL;  Surgeon: Algernon Huxley, MD;  Location: [place] [facility];  Service: Cardiovascular;  Laterality: N/A;  . DIALYSIS/PERMA CATHETER REMOVAL N/A 12/31/2017   Procedure: DIALYSIS/PERMA CATHETER REMOVAL;  Surgeon: Algernon Huxley, MD;  Location: [place] [facility];  Service: Cardiovascular;  Laterality: N/A;  . hernia repair    . SACROPLASTY N/A 09/30/2018   Procedure: SACROPLASTY;  Surgeon: Hessie Knows, MD;  Location: [facility];  Service: Orthopedics;  Laterality: N/A;  . UPPER EXTREMITY ANGIOGRAPHY Left 09/05/2017   Procedure: Upper Extremity Angiography;  Surgeon: Katha Cabal, MD;  Location: [place] [facility];  Service: Cardiovascular;  Laterality: Left;    Prior to Admission medications   Medication Sig Start Date End Date Taking? Authorizing Provider  acetaminophen (TYLENOL) 325 MG tablet Take 2 tablets (650 mg total) by mouth every 6 (six) hours as needed for mild pain (or Fever >/= 101). 11/28/17   [name], MD  allopurinol (ZYLOPRIM) 100 MG tablet Take 100 mg by mouth daily. 10/17/15   [provider]  atorvastatin (LIPITOR) 20 MG tablet Take 20 mg by mouth daily.    [provider]  ferrous sulfate 325 (65 FE) MG tablet Take 325 mg by mouth 2 (two) times daily with a meal.  08/30/15   [provider]  glimepiride (AMARYL) 2 MG tablet Take 2 mg by mouth 2 (two) times daily.  04/24/17   [provider]  hydrALAZINE (APRESOLINE) 50 MG tablet Take 50 mg by mouth 3 (three) times daily.    [provider]  lactulose (CHRONULAC) 10 GM/15ML solution Take 20 g (30 mL) 3-4 times a day for a goal of at least 2 bowel movements a day. 07/21/20   Mercy Riding, MD  lidocaine-prilocaine (EMLA) cream Apply 1 application topically daily as needed. 10/16/17   [provider]  metoprolol succinate (TOPROL-XL) 25 MG 24 hr tablet Take 25 mg by mouth daily.    [provider]  multivitamin (RENA-VIT) TABS  tablet Take 1 tablet by mouth at bedtime. 10/14/18   Mayo, Pete Pelt, MD  pantoprazole (PROTONIX) 40 MG tablet Take 1 tablet (40 mg total) by mouth daily. 02/03/17   Theodoro Grist, MD  Polyvinyl Alcohol-Povidone (REFRESH OP) Place 1 drop into both eyes 2 (two) times daily.    [provider]  sodium bicarbonate 650 MG tablet Take 650 mg by mouth 2 (two) times daily.    [provider]  tamsulosin (FLOMAX) 0.4 MG CAPS capsule Take 1 capsule (0.4 mg total) by mouth daily after supper. 02/19/17   Epifanio Lesches, MD  Vitamin D, Cholecalciferol, 1000 UNITS TABS Take 1 tablet by mouth daily.     [provider]    Allergies Patient has no known allergies.  Family History  Problem Relation Age of Onset  . Hypertension Mother   . Diabetes Neg Hx     Social History Social History   Tobacco Use  . Smoking status: Never Smoker  . Smokeless tobacco: Never Used  Vaping Use  . Vaping Use: Never used  Substance Use Topics  . Alcohol use: No    Comment: Drank in the past, but has stopped for several years.   . Drug  use: No    Review of Systems  Constitutional: Negative for fever. Eyes: Negative for visual changes. ENT: Negative for sore throat. Neck: No neck pain  Cardiovascular: Negative for chest pain. Respiratory: Negative for shortness of breath. Gastrointestinal: + abdominal pain, nausea, and diarrhea. Genitourinary: Negative for dysuria. Musculoskeletal: Negative for back pain. Skin: Negative for rash. Neurological: Negative for headaches, weakness or numbness. Psych: No SI or HI  ____________________________________________   PHYSICAL EXAM:  VITAL SIGNS: ED Triage Vitals  Enc Vitals Group     BP 04/13/21 2108 (!) 85/41     Pulse Rate 04/13/21 2108 78     Resp 04/13/21 2108 18     Temp 04/13/21 2108 98.4 F (36.9 C)     Temp Source 04/14/21 0050 Oral     SpO2 04/13/21 2108 96 %     Weight --      Height 04/13/21 2108 '5\' 5"'$  (1.651 m)      Head Circumference --      Peak Flow --      Pain Score 04/13/21 2108 10     Pain Loc --      Pain Edu? --      Excl. in [place]? --     Constitutional: Alert and oriented. Well appearing and in no apparent distress. HEENT:      Head: Normocephalic and atraumatic.         Eyes: Conjunctivae are normal. Sclera is non-icteric.       Mouth/Throat: Mucous membranes are moist.       Neck: Supple with no signs of meningismus. Cardiovascular: Regular rate and rhythm. No murmurs, gallops, or rubs. 2+ symmetrical distal pulses are present in all extremities. No JVD. Respiratory: Normal respiratory effort. Lungs are clear to auscultation bilaterally.  Gastrointestinal: Soft, tender to palpation to RLQ, and non distended with positive bowel sounds. No rebound or guarding. Genitourinary: No CVA tenderness. Musculoskeletal:  No edema, cyanosis, or erythema of extremities. Neurologic: Normal speech and language. Face is symmetric. Moving all extremities. No gross focal neurologic deficits are appreciated. Skin: Skin is warm, dry and intact. No rash noted. Psychiatric: Mood and affect are normal. Speech and behavior are normal.  ____________________________________________   LABS (all labs ordered are listed, but only abnormal results are displayed)  Labs Reviewed  COMPREHENSIVE METABOLIC PANEL - Abnormal; Notable for the following components:      Result Value   CO2 16 (*)    Glucose, Bld 150 (*)    BUN 79 (*)    Creatinine, Ser 9.45 (*)    Alkaline Phosphatase 214 (*)    Total Bilirubin 1.4 (*)    GFR, Estimated 5 (*)    Anion gap 19 (*)    All other components within normal limits  CBC - Abnormal; Notable for the following components:   WBC 14.8 (*)    RBC 4.14 (*)    All other components within normal limits  RESP PANEL BY RT-PCR (FLU A&B, COVID) ARPGX2  C DIFFICILE QUICK SCREEN W PCR REFLEX  LIPASE, BLOOD  URINALYSIS, COMPLETE (UACMP) WITH MICROSCOPIC    ____________________________________________  EKG  ED ECG REPORT I, Rudene Re, the attending physician, personally viewed and interpreted this ECG.  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  Unchanged from prior. ____________________________________________  RADIOLOGY  I have personally reviewed the images performed during this visit and I agree with the Radiologist's read.   Interpretation by Radiologist:  CT ABDOMEN PELVIS W CONTRAST  Result Date: 04/14/2021 CLINICAL DATA:  80 year old male with abdominal pain and headache. Nausea and diarrhea. EXAM: CT ABDOMEN AND PELVIS WITH CONTRAST TECHNIQUE: Multidetector CT imaging of the abdomen and pelvis was performed using the standard protocol following bolus administration of intravenous contrast. CONTRAST:  161m OMNIPAQUE IOHEXOL 300 MG/ML  SOLN COMPARISON:  Prior noncontrast CT Abdomen and Pelvis 10/10/2018 and earlier. FINDINGS: Lower chest: Calcified coronary artery atherosclerosis. Borderline to mild cardiomegaly. No pericardial effusion. Lung base scarring and/or atelectasis is stable since 2019. Stable mild elevation of the left hemidiaphragm. No pleural effusion. Hepatobiliary: Nodular, cirrhotic liver. Gallbladder distension similar to 2019. No definite pericholecystic inflammation. No bile duct enlargement. No discrete liver lesion. Small caliber gastrohepatic ligament varices. Pancreas: Negative. Spleen: Nonenlarged, negative. There are prominent gastrosplenic ligament varices. Associated left spleno renal varices. Adrenals/Urinary Tract: Negative adrenal glands. Native renal atrophy. Renal vascular calcifications and occasional small cysts. No hydronephrosis or hydroureter. Diminutive urinary bladder. Stomach/Bowel: Decompressed rectosigmoid colon. Mild sigmoid diverticulosis. Decompressed descending colon. Gas in the transverse colon which appears negative.  There is fluid in the right colon which is indistinct with circumferential wall thickening and the appendix is decompressed and appears to remain normal. Terminal ileum not definitely involved. Overall a roughly 10 cm segment of the right colon from the cecum to the ascending appears abnormal. No dilated small bowel. Decompressed stomach and duodenum. No free air. No free fluid. Mild regional mesenteric stranding (series 5, image 59). Vascular/Lymphatic: Calcified aortic atherosclerosis. Major arterial structures in the abdomen and pelvis are patent. There is calcified bilateral femoral artery atherosclerosis. Portal venous system is patent. Reproductive: 16 mm rim enhancing low to intermediate density fluid collection off center in the prostate on series 2, image 83. Prostate size appears stable since 2019, with no inflammation surrounding the prostate. This is not obviously in communication with the prosthetic urethra, which is diminutive and difficult to identified. Negative visible inguinal canals. Other: No pelvic free fluid. Musculoskeletal: Prior plasty with bone cement of the bilateral iliac wings, stable. No acute osseous abnormality identified. IMPRESSION: 1. Inflamed 10 cm segment of the ascending colon and cecum compatible with acute colitis. Terminal ileum and appendix appear uninvolved. No perforation or complicating features identified. 2. 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 3. Small 1.6 cm rim enhancing fluid collection in the central prostate to the right of midline. No evidence of active prostatitis. 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 Electronically Signed   By: HGenevie AnnM.D.   On: 04/14/2021 05:13     ____________________________________________   PROCEDURES  Procedure(s) performed: yes .1-3 Lead EKG Interpretation Performed by: VRudene Re MD Authorized by: VRudene Re MD      Interpretation: abnormal     ECG rate assessment: normal     Rhythm: sinus rhythm     Ectopy: none     Conduction: abnormal     Critical Care performed: yes  CRITICAL CARE Performed by: CRudene Re ?  Total critical care time: 30 min  Critical care time was exclusive of separately billable procedures and treating other patients.  Critical care was necessary to treat or prevent imminent or life-threatening deterioration.  Critical care was time spent personally by me on the following activities: development of treatment plan with patient and/or surrogate as well as nursing, discussions with consultants, evaluation of patient's response to treatment, examination of patient, obtaining history from patient or surrogate, ordering  and performing treatments and interventions, ordering and review of laboratory studies, ordering and review of radiographic studies, pulse oximetry and re-evaluation of patient's condition.  ____________________________________________   INITIAL IMPRESSION / ASSESSMENT AND PLAN / ED COURSE  80 y.o. male history of ESRD on HD (TTS), diabetes, C. difficile colitis, hypertension, hyperlipidemia who presents for evaluation of abdominal pain, nausea and diarrhea for 7 days.  Has not missed dialysis.  Last treatment was Thursday.  Patient is tender to palpation on the right lower quadrant with no rebound or guarding.  Differential diagnosis including appendicitis versus diverticulitis versus colitis.  CT showing colitis, given IV Zosyn.  C. difficile pending.  Labs showing no indication for emergent dialysis.  Patient does have a white count of 14.8.  Patient very hypotensive with BPs between [id number] and 0000000 systolic.  Patient received 2 L of fluid and remains hypotensive.  Therefore will admit to the hospitalist service.  History gathered from patient and his daughter who is at bedside.  Plan discussed with both of them.      _____________________________________________ Please note:  Patient was evaluated in Emergency Department today for the symptoms described in the history of present illness. Patient was evaluated in the context of the global COVID-19 pandemic, which necessitated consideration that the patient might be at risk for infection with the SARS-CoV-2 virus that causes COVID-19. Institutional protocols and algorithms that pertain to the evaluation of patients at risk for COVID-19 are in a state of rapid change based on information released by regulatory bodies including the CDC and federal and state organizations. These policies and algorithms were followed during the patient's care in the ED.  Some ED evaluations and interventions may be delayed as a result of limited staffing during the pandemic.   [place] Controlled Substance Database was reviewed by me. ____________________________________________   FINAL CLINICAL IMPRESSION(S) / ED DIAGNOSES   Final diagnoses:  Colitis  Hypotension, unspecified hypotension type      NEW MEDICATIONS STARTED DURING THIS VISIT:  ED Discharge Orders    None       Note:  This document was prepared using Dragon voice recognition software and may include unintentional dictation errors.    Rudene Re, MD 04/14/21 [phone]

## 2021-04-14 NOTE — H&P (Addendum)
NAME:  Martin Gilbert, MRN:  [id number], DOB:  April 02, 1941, LOS: 0 ADMISSION DATE:  04/14/2021, CONSULTATION DATE: 04/14/21 REFERRING MD:  Rudene Re MD CHIEF COMPLAINT: Abdominal Pain   History of present illness   80 year old male with significant past medical history as below presenting to the ED with chief complaints of right lower quadrant abdominal pain, several episodes of diarrhea nausea and vomiting x7 days.  ED Course: On arrival to the ED, he was afebrile with blood pressure 85/41 mm Hg and pulse rate 91 beats/min, sats 96% on room air. There were no focal neurological deficits; he was alert and oriented per ED reports.  He was noted to be tender to palpation on the right lower quadrant with no rebound tenderness or guarding.  Per ED notes, missed dialysis.  Last treatment was Thursday.  Initial labs revealed normal lipase, glucose 150, BUN 79, creatinine 9.45, alk phos 214, total bilirubin 1.4, anion gap 19 otherwise unremarkable CMP, wbc 14.8 otherwise unremarkable CBC. CTA abdomen and pelvis was obtained and showed inflammation of the ascending colon and cecum compatible with acute colitis.  Patient was started on IV Zosyn stool sample sent for C. difficile and GI panel.  Patient remained hypotensive despite 2 L of fluid boluses therefore PCCM was consulted for further management.  Past Medical History  ESRD on HD TTS Colitis Enteritis due to C. difficile C. difficile colitis UTI due to E. 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 Terminal ileum and appendix appear uninvolved. No perforation or complicating features identified. 2. 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 3. Small 1.6 cm rim enhancing fluid collection in the central prostate to the right of midline. No evidence of active prostatitis. 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   Micro Data:  4/30: SARS-CoV-2 PCR> 4/30: Influenza PCR> 4/30: Blood culture x2> 4/30: Urine> 4/30: MRSA PCR> 4/30: C-Diff quick screen> 4/30: GI Panel>  Antimicrobials:  Zosyn 4/30>  OBJECTIVE  Blood pressure (!) 106/51, pulse (!) 57, temperature 97.9 F (36.6 C), temperature source Oral, resp. rate 15, height '5\' 5"'  (1.651 m), SpO2 96 %.        Intake/Output Summary (Last 24 hours) at 04/14/2021 0744 Last data filed at 04/14/2021 3817 Gross per 24 hour  Intake 2041.44 ml  Output --  Net 2041.44 ml   There were no vitals filed for this visit.   Physical Examination  GENERAL:80 year-old patient lying in the bed with no acute distress.  EYES: Pupils equal, round, reactive to light and accommodation. No scleral icterus. Extraocular muscles intact.  HEENT: Head atraumatic, normocephalic. Oropharynx and nasopharynx clear.  NECK:  Supple, no jugular venous distention. No thyroid enlargement, no tenderness.  LUNGS: Normal breath sounds bilaterally, no wheezing, rales,rhonchi or crepitation. No use of accessory muscles of respiration.  CARDIOVASCULAR: S1, S2 normal. No murmurs, rubs, or gallops.  ABDOMEN: Soft, nontender, nondistended. Bowel  sounds present. No organomegaly or mass.  EXTREMITIES: No pedal edema, cyanosis, or clubbing.  NEUROLOGIC: Cranial nerves II through XII are intact. Except speech is  mildly garbled but comprehensible. Muscle strength 5/5 in all extremities. Sensation intact. Gait not checked.  PSYCHIATRIC: The patient is alert and oriented x 3.  SKIN: No obvious rash, lesion, or ulcer.   Labs/imaging that I havepersonally reviewed  (right click and "Reselect all SmartList Selections" daily)      Labs   CBC: Recent Labs  Lab 04/13/21 2123  WBC 14.8*  HGB 13.4  HCT 39.9  MCV 96.4  PLT 341    Basic Metabolic Panel: Recent Labs  Lab 04/13/21 2123  NA 135  K 4.1  CL 100  CO2 16*  GLUCOSE 150*  BUN 79*  CREATININE 9.45*  CALCIUM 9.3   GFR: CrCl cannot be calculated (Unknown ideal weight.). Recent Labs  Lab 04/13/21 2123  WBC 14.8*    Liver Function Tests: Recent Labs  Lab 04/13/21 2123  AST 23  ALT 24  ALKPHOS 214*  BILITOT 1.4*  PROT 7.7  ALBUMIN 3.5   Recent Labs  Lab 04/13/21 2123  LIPASE 30   No results for input(s): AMMONIA in the last 168 hours.  ABG    Component Value Date/Time   PHART 7.57 (H) 03/29/2017 1026   PCO2ART 35 03/29/2017 1026   PO2ART 65 (L) 03/29/2017 1026   HCO3 33.5 (H) 07/18/2020 1229   ACIDBASEDEF 8.1 (H) 07/27/2015 1557   O2SAT 98.1 07/18/2020 1229     Coagulation Profile: No results for input(s): INR, PROTIME in the last 168 hours.  Cardiac Enzymes: No results for input(s): CKTOTAL, CKMB, CKMBINDEX, TROPONINI in the last 168 hours.  HbA1C: Hgb A1c MFr Bld  Date/Time Value Ref Range Status  07/18/2020 04:21 PM 5.3 4.8 - 5.6 % Final    Comment:    (NOTE) Pre diabetes:          5.7%-6.4%  Diabetes:              >6.4%  Glycemic control for   <7.0% adults with diabetes   02/15/2017 02:26 PM 5.2 4.8 - 5.6 % Final    Comment:    (NOTE)         Pre-diabetes: 5.7 - 6.4         Diabetes: >6.4         Glycemic control for adults with diabetes: <7.0     CBG: No results for input(s): GLUCAP in the last 168 hours.  Review of Systems:    Review of Systems  Constitutional: Positive for  chills, diaphoresis, fever, malaise/fatigue and weight loss.  HENT: Negative.   Eyes: Negative.   Respiratory: Negative.   Cardiovascular: Negative.   Gastrointestinal: Positive for abdominal pain, diarrhea, nausea and vomiting. Negative for blood in stool and melena.  Genitourinary: Negative.   Musculoskeletal: Positive for back pain, falls, joint pain and neck pain.  Skin: Positive for itching and rash.  Neurological: Positive for tingling, sensory change and weakness.  Endo/Heme/Allergies: Negative.   Psychiatric/Behavioral: Negative.      Due to language barrier, an interpreter was present during the history-taking and subsequent discussion (and for part of the physical exam) with this patient.   Past Medical History  He,  has a past medical history of Anemia, Arthritis, Back pain, BPH (benign prostatic hyperplasia), CKD (chronic kidney disease), stage IV ([place]), Clostridium difficile colitis, Diabetes mellitus without complication ([place]), GERD (gastroesophageal reflux disease), HTN (hypertension),  and Hyperlipidemia.   Surgical History    Past Surgical History:  Procedure Laterality Date  . A/V FISTULAGRAM Left 04/20/2018   Procedure: A/V FISTULAGRAM;  Surgeon: Algernon Huxley, MD;  Location: [facility];  Service: Cardiovascular;  Laterality: Left;  . A/V FISTULAGRAM Left 11/25/2018   Procedure: A/V FISTULAGRAM;  Surgeon: Algernon Huxley, MD;  Location: [facility];  Service: Cardiovascular;  Laterality: Left;  . A/V FISTULAGRAM Left 01/11/2019   Procedure: A/V FISTULAGRAM;  Surgeon: Algernon Huxley, MD;  Location: [place] [facility];  Service: Cardiovascular;  Laterality: Left;  . AV FISTULA PLACEMENT Left 06/13/2017   Procedure: ARTERIOVENOUS (AV) FISTULA CREATION ( RADIAL CEPHALIC );  Surgeon: Katha Cabal, MD;  Location: [facility];  Service: Vascular;  Laterality: Left;  . CATARACT EXTRACTION     one eye not sure which  . DIALYSIS/PERMA CATHETER INSERTION N/A  04/03/2017   Procedure: Dialysis/Perma Catheter Insertion;  Surgeon: Algernon Huxley, MD;  Location: [place] [facility];  Service: Cardiovascular;  Laterality: N/A;  . DIALYSIS/PERMA CATHETER INSERTION N/A 08/06/2017   Procedure: DIALYSIS/PERMA CATHETER INSERTION;  Surgeon: Algernon Huxley, MD;  Location: [place] [facility];  Service: Cardiovascular;  Laterality: N/A;  . DIALYSIS/PERMA CATHETER REMOVAL N/A 08/04/2017   Procedure: DIALYSIS/PERMA CATHETER REMOVAL;  Surgeon: Algernon Huxley, MD;  Location: [place] [facility];  Service: Cardiovascular;  Laterality: N/A;  . DIALYSIS/PERMA CATHETER REMOVAL N/A 12/31/2017   Procedure: DIALYSIS/PERMA CATHETER REMOVAL;  Surgeon: Algernon Huxley, MD;  Location: [place] [facility];  Service: Cardiovascular;  Laterality: N/A;  . hernia repair    . SACROPLASTY N/A 09/30/2018   Procedure: SACROPLASTY;  Surgeon: Hessie Knows, MD;  Location: [facility];  Service: Orthopedics;  Laterality: N/A;  . UPPER EXTREMITY ANGIOGRAPHY Left 09/05/2017   Procedure: Upper Extremity Angiography;  Surgeon: Katha Cabal, MD;  Location: [place] [facility];  Service: Cardiovascular;  Laterality: Left;     Social History   reports that he has never smoked. He has never used smokeless tobacco. He reports that he does not drink alcohol and does not use drugs.   Family History   His family history includes Hypertension in his mother. There is no history of Diabetes.   Allergies No Known Allergies   Home Medications  Prior to Admission medications   Medication Sig Start Date End Date Taking? Authorizing Provider  acetaminophen (TYLENOL) 325 MG tablet Take 2 tablets (650 mg total) by mouth every 6 (six) hours as needed for mild pain (or Fever >/= 101). 11/28/17   [name], MD  allopurinol (ZYLOPRIM) 100 MG tablet Take 100 mg by mouth daily. 10/17/15   [provider]  atorvastatin (LIPITOR) 20 MG tablet Take 20 mg by mouth daily.    [provider]  ferrous  sulfate 325 (65 FE) MG tablet Take 325 mg by mouth 2 (two) times daily with a meal.  08/30/15   [provider]  glimepiride (AMARYL) 2 MG tablet Take 2 mg by mouth 2 (two) times daily.  04/24/17   [provider]  hydrALAZINE (APRESOLINE) 50 MG tablet Take 50 mg by mouth 3 (three) times daily.    [provider]  lactulose (CHRONULAC) 10 GM/15ML solution Take 20 g (30 mL) 3-4 times a day for a goal of at least 2 bowel movements a day. 07/21/20   Mercy Riding, MD  lidocaine-prilocaine (EMLA) cream Apply 1 application topically daily as needed. 10/16/17  [provider]  metoprolol succinate (TOPROL-XL) 25 MG 24 hr tablet Take 25 mg by mouth daily.    [provider]  multivitamin (RENA-VIT) TABS tablet Take 1 tablet by mouth at bedtime. 10/14/18   Mayo, Pete Pelt, MD  pantoprazole (PROTONIX) 40 MG tablet Take 1 tablet (40 mg total) by mouth daily. 02/03/17   Theodoro Grist, MD  Polyvinyl Alcohol-Povidone (REFRESH OP) Place 1 drop into both eyes 2 (two) times daily.    [provider]  sodium bicarbonate 650 MG tablet Take 650 mg by mouth 2 (two) times daily.    [provider]  tamsulosin (FLOMAX) 0.4 MG CAPS capsule Take 1 capsule (0.4 mg total) by mouth daily after supper. 02/19/17   Epifanio Lesches, MD  Vitamin D, Cholecalciferol, 1000 UNITS TABS Take 1 tablet by mouth daily.     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 difficile colitis CTA abdomen and pelvis shows acute colitis - Will admit to [place] since patient is stable and does not require pressors - C. Difficile pending - GI panel Pending - NPO for now - Started on Zosyn by EDP - IV rehydration and electrolyte  repletion - Will avoid anti-motility agents due to risk of precipitate toxic megacolon - Continue supportive care - ID consult  ESRD on HD TTS Last HD on Thursday, please no indication for emergent dialysis - Daily BMP, replace electrolytes PRN - Avoid nephrotoxic agents as able, ensure adequate renal perfusion - Nephrology Consult if iHD or CRRT indicated  While admitted   Hypertension - Now borderline hypotensive in the setting of above - Hold BP meds for now - May require midodrine or pressors if no improvement currently BP stable and does not require pressors    Diabetes mellitus - CBGs - Sliding scale insulin - Follow  hyper/hypoglycemia protocol - Hold home  Amaryl  Anemia of CKD:  Hgb stable at 13.4  Receiving Epogen to with each HD treatment.       Best practice:  Diet:  NPO Pain/Anxiety/Delirium protocol (if indicated): Yes (RASS goal 0) VAP protocol (if indicated): Not indicated DVT prophylaxis: Subcutaneous Heparin GI prophylaxis: PPI Glucose control:  SSI Yes and Basal insulin Yes Central venous access:  N/A Arterial line:  N/A Foley:  N/A Mobility:  bed rest  PT consulted: N/A Last date of multidisciplinary goals of care discussion [4/30] Code Status:  full code Disposition: stepdown  Critical care time: 23      Due to language barrier, an interpreter was present during the history-taking and subsequent discussion (and for part of the physical exam) with this patient.    Rufina Falco, DNP, CCRN, FNP-C, AGACNP-BC Acute Care Nurse Practitioner  [facility] Pager: [phone] [place] [facility]  .

## 2021-04-14 NOTE — ED Notes (Signed)
April, RN made aware of pt BP.

## 2021-04-14 NOTE — Plan of Care (Signed)
Patient arrived from ED, speaks no English waiting for family to arrive

## 2021-04-14 NOTE — ED Notes (Addendum)
Pt noted to have L arm restriction for blood draws and BP   This RN placed pink restriction sleeve on pt L arm at this time

## 2021-04-14 NOTE — Progress Notes (Signed)
Pharmacy Antibiotic Note  Martin Gilbert is a 80 y.o. male admitted on 04/14/2021 with Intra-abdominal infection.  Pharmacy has been consulted for Zosyn dosing. Patient is and HD patient.  Plan: Zosyn 2.25g IV q8h  Height: '5\' 5"'$  (165.1 cm) IBW/kg (Calculated) : 61.5  Temp (24hrs), Avg:98.3 F (36.8 C), Min:97.9 F (36.6 C), Max:99.1 F (37.3 C)  Recent Labs  Lab 04/13/21 2123 04/14/21 0825 04/14/21 0856 04/14/21 2023  WBC 14.8*  --   --  14.9*  CREATININE 9.45*  --   --  10.84*  LATICACIDVEN  --  1.3 1.2  --     CrCl cannot be calculated (Unknown ideal weight.).    No Known Allergies  Antimicrobials this admission: Zosyn 4/30 >>      Dose adjustments this admission:  Microbiology results:  Thank you for allowing pharmacy to be a part of this patient's care.  Paulina Fusi, PharmD, BCPS 04/14/2021 9:40 PM

## 2021-04-14 NOTE — Consult Note (Signed)
PHARMACY CONSULT NOTE  Pharmacy Consult for Electrolyte Monitoring and Replacement   Recent Labs: Potassium (mmol/L)  Date Value  04/13/2021 4.1  04/08/2015 4.0   Magnesium (mg/dL)  Date Value  07/21/2020 1.8   Calcium (mg/dL)  Date Value  04/13/2021 9.3   Calcium, Total (mg/dL)  Date Value  04/08/2015 8.5 (L)   Albumin (g/dL)  Date Value  04/13/2021 3.5  04/09/2015 3.1 (L)   Phosphorus (mg/dL)  Date Value  07/21/2020 4.5   Sodium (mmol/L)  Date Value  04/13/2021 135  04/08/2015 138   Assessment: Patient is an 80 y/o M with medical history including ESRD on HD Tu/Th/Sa, C diff colitis, diabetes, BPH with LUTS, gout, HTN, HLD, GERD who is admitted with acute on chronic colitis. Pharmacy consulted to assist with electrolyte monitoring and replacement as indicated.  Goal of Therapy:  Electrolytes within normal limits  Plan:  --No electrolyte replacement warranted at this time --Will follow-up electrolytes with AM labs tomorrow  [name] 04/14/2021 3:48 PM

## 2021-04-15 DIAGNOSIS — E860 Dehydration: Secondary | ICD-10-CM

## 2021-04-15 DIAGNOSIS — A0472 Enterocolitis due to Clostridium difficile, not specified as recurrent: Secondary | ICD-10-CM

## 2021-04-15 DIAGNOSIS — N186 End stage renal disease: Secondary | ICD-10-CM | POA: Diagnosis not present

## 2021-04-15 DIAGNOSIS — D638 Anemia in other chronic diseases classified elsewhere: Secondary | ICD-10-CM

## 2021-04-15 DIAGNOSIS — I959 Hypotension, unspecified: Secondary | ICD-10-CM | POA: Diagnosis not present

## 2021-04-15 DIAGNOSIS — I10 Essential (primary) hypertension: Secondary | ICD-10-CM

## 2021-04-15 DIAGNOSIS — K529 Noninfective gastroenteritis and colitis, unspecified: Secondary | ICD-10-CM | POA: Diagnosis not present

## 2021-04-15 DIAGNOSIS — A0811 Acute gastroenteropathy due to Norwalk agent: Secondary | ICD-10-CM | POA: Diagnosis present

## 2021-04-15 LAB — GASTROINTESTINAL PANEL BY PCR, STOOL (REPLACES STOOL CULTURE)

## 2021-04-15 LAB — CBC
HCT: 34.5 % — ABNORMAL LOW (ref 39.0–52.0)
Hemoglobin: 11.5 g/dL — ABNORMAL LOW (ref 13.0–17.0)
MCH: 32.2 pg (ref 26.0–34.0)
MCHC: 33.3 g/dL (ref 30.0–36.0)
MCV: 96.6 fL (ref 80.0–100.0)
Platelets: 171 10*3/uL (ref 150–400)
RBC: 3.57 MIL/uL — ABNORMAL LOW (ref 4.22–5.81)
RDW: 14.2 % (ref 11.5–15.5)
WBC: 14.1 10*3/uL — ABNORMAL HIGH (ref 4.0–10.5)
nRBC: 0 % (ref 0.0–0.2)

## 2021-04-15 LAB — PROCALCITONIN: Procalcitonin: 3.11 ng/mL

## 2021-04-15 LAB — BASIC METABOLIC PANEL
Anion gap: 17 — ABNORMAL HIGH (ref 5–15)
BUN: 98 mg/dL — ABNORMAL HIGH (ref 8–23)
CO2: 17 mmol/L — ABNORMAL LOW (ref 22–32)
Calcium: 8.6 mg/dL — ABNORMAL LOW (ref 8.9–10.3)
Chloride: 97 mmol/L — ABNORMAL LOW (ref 98–111)
Creatinine, Ser: 12.24 mg/dL — ABNORMAL HIGH (ref 0.61–1.24)
GFR, Estimated: 4 mL/min — ABNORMAL LOW (ref >60)
Glucose, Bld: 164 mg/dL — ABNORMAL HIGH (ref 70–99)
Potassium: 4.7 mmol/L (ref 3.5–5.1)
Sodium: 131 mmol/L — ABNORMAL LOW (ref 135–145)

## 2021-04-15 LAB — MAGNESIUM: Magnesium: 1.6 mg/dL — ABNORMAL LOW (ref 1.7–2.4)

## 2021-04-15 LAB — CLOSTRIDIUM DIFFICILE BY PCR, REFLEXED: Toxigenic C. Difficile by PCR: POSITIVE — AB

## 2021-04-15 LAB — C DIFFICILE QUICK SCREEN W PCR REFLEX
C Diff antigen: POSITIVE — AB
C Diff toxin: NEGATIVE

## 2021-04-15 LAB — GLUCOSE, CAPILLARY: Glucose-Capillary: 159 mg/dL — ABNORMAL HIGH (ref 70–99)

## 2021-04-15 LAB — PHOSPHORUS: Phosphorus: 4.9 mg/dL — ABNORMAL HIGH (ref 2.5–4.6)

## 2021-04-15 MED ORDER — MAGNESIUM SULFATE 2 GM/50ML IV SOLN: 2.0000 g | Freq: Once | INTRAVENOUS | Status: DC

## 2021-04-15 MED ORDER — MAGNESIUM SULFATE IN D5W 1-5 GM/100ML-% IV SOLN
1.0000 g | Freq: Once | INTRAVENOUS | Status: AC
  Administered 2021-04-15: 1 g via INTRAVENOUS
  Filled 2021-04-15: qty 100

## 2021-04-15 MED ORDER — VANCOMYCIN 50 MG/ML ORAL SOLUTION
125.0000 mg | Freq: Four times a day (QID) | ORAL | Status: DC
  Filled 2021-04-15 (×2): qty 2.5

## 2021-04-15 MED ORDER — FIDAXOMICIN 200 MG PO TABS
200.0000 mg | ORAL_TABLET | Freq: Two times a day (BID) | ORAL | Status: DC
  Administered 2021-04-15 (×2): 200 mg via ORAL
  Filled 2021-04-15 (×4): qty 1

## 2021-04-15 MED ORDER — INSULIN ASPART 100 UNIT/ML IJ SOLN
0.0000 [IU] | Freq: Three times a day (TID) | INTRAMUSCULAR | Status: DC
Start: 2021-04-16 — End: 2021-04-17
  Administered 2021-04-16: 1 [IU] via SUBCUTANEOUS
  Filled 2021-04-15: qty 1

## 2021-04-15 NOTE — Consult Note (Signed)
PHARMACY CONSULT NOTE  Pharmacy Consult for Electrolyte Monitoring and Replacement   Recent Labs: Potassium (mmol/L)  Date Value  04/15/2021 4.7  04/08/2015 4.0   Magnesium (mg/dL)  Date Value  04/15/2021 1.6 (L)   Calcium (mg/dL)  Date Value  04/15/2021 8.6 (L)   Calcium, Total (mg/dL)  Date Value  04/08/2015 8.5 (L)   Albumin (g/dL)  Date Value  04/13/2021 3.5  04/09/2015 3.1 (L)   Phosphorus (mg/dL)  Date Value  04/15/2021 4.9 (H)   Sodium (mmol/L)  Date Value  04/15/2021 131 (L)  04/08/2015 138   Assessment: Patient is an 80 y/o M with medical history including ESRD on HD Tu/Th/Sa, C diff colitis, diabetes, BPH with LUTS, gout, HTN, HLD, GERD who is admitted with acute on chronic colitis. Pharmacy consulted to assist with electrolyte monitoring and replacement as indicated.  Goal of Therapy:  Electrolytes within normal limits  Plan:  -Mg 1.6, will replace conservatively with Magnesium Sulfate 1g IV times one --Will follow-up electrolytes with AM labs tomorrow  Paulina Fusi, PharmD, BCPS 04/15/2021 3:13 PM

## 2021-04-15 NOTE — Plan of Care (Addendum)
  Problem: Activity: Goal: Risk for activity intolerance will decrease Outcome: Progressing   Problem: Pain Managment: Goal: General experience of comfort will improve Outcome: Progressing   Problem: Safety: Goal: Ability to remain free from injury will improve Outcome: Progressing   Pt is alert and oriented. No complaints of pain at this time. Pt had 2 x BM this shift.

## 2021-04-15 NOTE — Plan of Care (Signed)
  Problem: Activity: Goal: Risk for activity intolerance will decrease Outcome: Progressing   Problem: Elimination: Goal: Will not experience complications related to bowel motility Outcome: Progressing Goal: Will not experience complications related to urinary retention Outcome: Progressing   Problem: Pain Managment: Goal: General experience of comfort will improve Outcome: Progressing   Problem: Safety: Goal: Ability to remain free from injury will improve Outcome: Progressing   

## 2021-04-15 NOTE — Progress Notes (Signed)
PROGRESS NOTE    Martin Gilbert  TIW:[id number] DOB: 12/14/1941 DOA: 04/14/2021 PCP: [name], MD (Confirm with patient/family/NH records and if not entered, this HAS to be entered at [facility] point of entry. "No PCP" if truly none.)   Chief Complaint  Patient presents with  . Abdominal Pain    Brief Narrative:  80 year old male with significant past medical history as below presenting to the ED with chief complaints of right lower quadrant abdominal pain, several episodes of diarrhea nausea and vomiting x7 days.  ED Course: On arrival to the ED, he was afebrile with blood pressure 85/41 mm Hg and pulse rate 91 beats/min, sats 96% on room air. There were no focal neurological deficits; he was alert and oriented per ED reports.  He was noted to be tender to palpation on the right lower quadrant with no rebound tenderness or guarding.  Per ED notes, missed dialysis.  Last treatment was Thursday.  Initial labs revealed normal lipase, glucose 150, BUN 79, creatinine 9.45, alk phos 214, total bilirubin 1.4, anion gap 19 otherwise unremarkable CMP, wbc 14.8 otherwise unremarkable CBC. CTA abdomen and pelvis was obtained and showed inflammation of the ascending colon and cecum compatible with acute colitis.  Patient was started on IV Zosyn stool sample sent for C. difficile and GI panel.  Patient remained hypotensive despite 2 L of fluid boluses therefore PCCM was consulted for further management.   Assessment & Plan:   Principal Problem:   Acute colitis Active Problems:   C. difficile colitis   Diabetes type 2, uncontrolled ([place])   Hypertension   ESRD (end stage renal disease) ([place])   Acute gastroenteritis   Hypotension   Dehydration   Hypomagnesemia   Norovirus  1 acute colitis/C. difficile colitis/norovirus gastroenteritis -Patient with history of recurrent C. difficile colitis with last C. difficile positivity on 02/15/2017 and 03/27/2017. -Patient presented to the ED  with nausea vomiting multiple episodes of diarrhea compounded by hypotension.  Patient noted to have a leukocytosis. -Patient hydrated with IV fluids with improvement with blood pressure. -GI pathogen panel positive for norovirus.   -C. difficile PCR positive for C. difficile antigen, C. difficile toxin negative, toxigenic C. difficile PCR positive. -Procalcitonin elevated. -CT abdomen and pelvis done with inflamed 10 cm segment of the ascending colon and cecum compatible with acute colitis. -Due to patient's history of recurrent C. difficile colitis, abnormal findings on CT abdomen and pelvis, presentation with nausea vomiting diarrhea with associated hypotension we will treat patient for an acute C. difficile colitis with Dificid. -ID consulted and consultation pending.  2.  End-stage renal disease on hemodialysis TTS -Last hemodialysis was Thursday. -Patient with no indication for emergent dialysis. -Nephrology consulted and consultation pending.  3.  Hypertension -Patient presented with hypotension with borderline blood pressure. -Blood pressure medications on hold. -BP improved. -No need for pressors. -Follow.  4.  Dehydration -Patient hydrated with IV fluids. -Currently saline locked due to ESRD.  5.  Anemia of chronic kidney disease -Hemoglobin currently at 11.5. -Patient noted to be receiving Epogen with HD. -Nephrology consultation pending.  6.  Hypomagnesemia -Magnesium sulfate 2 g IV x1. -Repeat labs in the morning.  7.  Diabetes mellitus type 2 Hemoglobin A1c 5.3 (07/18/2020) -Check a hemoglobin A1c -Hold oral hypoglycemic agents. -SSI.   DVT prophylaxis: Heparin Code Status: Full Family Communication: Updated patient and son at bedside via iPad interpreter. Disposition:   Status is: Inpatient    Dispo: The patient is from: Home  Anticipated d/c is to: To be determined              Patient currently being treated for C. difficile colitis on  oral Dificid, nephrology consultation pending.  Not stable for discharge.   Difficult to place patient no       Consultants:   Nephrology pending  ID pending  Procedures:   CT abdomen and pelvis 04/14/2021  Antimicrobials:   Dificid 04/15/2021>>>>  IV Zosyn 04/14/2021>>>> 04/15/2021   Subjective: Patient laying in bed.  Son at bedside.  Denies any chest pain.  No shortness of breath.  States abdominal pain improved but still with a little bit of abdominal pain.  Tolerating diet.  States watery loose stools have improved with more consistency in his stools.  Asking when he is going to be able to go home. iPad interpreter used.  Objective: Vitals:   04/15/21 0425 04/15/21 0428 04/15/21 0828 04/15/21 1156  BP: (!) 114/44  (!) 115/45 (!) 130/53  Pulse: 72  63 (!) 59  Resp: _0 Temp: (!) 97.4 F (36.3 C)  98 F (36.7 C) 98.1 F (36.7 C)  TempSrc:   Oral   SpO2: 96%  97% 100%  Weight:  71.6 kg    Height:        Intake/Output Summary (Last 24 hours) at 04/15/2021 1801 Last data filed at 04/15/2021 0308 Gross per 24 hour  Intake 290 ml  Output --  Net 290 ml   Filed Weights   04/15/21 0428  Weight: 71.6 kg    Examination:  General exam: Appears calm and comfortable  Respiratory system: Clear to auscultation anterior lung fields. Respiratory effort normal. Cardiovascular system: S1 & S2 heard, RRR. No JVD, murmurs, rubs, gallops or clicks. No pedal edema. Gastrointestinal system: Abdomen is nondistended, soft and nontender. No organomegaly or masses felt. Normal bowel sounds heard. Central nervous system: Alert and oriented. No focal neurological deficits. Extremities: Symmetric 5 x 5 power. Skin: No rashes, lesions or ulcers Psychiatry: Judgement and insight appear normal. Mood & affect appropriate.     Data Reviewed: I have personally reviewed following labs and imaging studies  CBC: Recent Labs  Lab 04/13/21 2123 04/14/21 2023 04/15/21 1143  WBC  14.8* 14.9* 14.1*  HGB 13.4 12.2* 11.5*  HCT 39.9 36.5* 34.5*  MCV 96.4 97.3 96.6  PLT 221 172 703    Basic Metabolic Panel: Recent Labs  Lab 04/13/21 2123 04/14/21 2023 04/15/21 1143  NA 135  --  131*  K 4.1  --  4.7  CL 100  --  97*  CO2 16*  --  17*  GLUCOSE 150*  --  164*  BUN 79*  --  98*  CREATININE 9.45* 10.84* 12.24*  CALCIUM 9.3  --  8.6*  MG  --   --  1.6*  PHOS  --   --  4.9*    GFR: Estimated Creatinine Clearance: 4.2 mL/min (A) (by C-G formula based on SCr of 12.24 mg/dL (H)).  Liver Function Tests: Recent Labs  Lab 04/13/21 2123  AST 23  ALT 24  ALKPHOS 214*  BILITOT 1.4*  PROT 7.7  ALBUMIN 3.5    CBG: No results for input(s): GLUCAP in the last 168 hours.   Recent Results (from the past 240 hour(s))  C Difficile Quick Screen w PCR reflex     Status: Abnormal   Collection Time: 04/14/21  5:45 AM   Specimen: STOOL  Result Value Ref Range Status  C Diff antigen POSITIVE (A) NEGATIVE Final   C Diff toxin NEGATIVE NEGATIVE Final   C Diff interpretation Results are indeterminate. See PCR results.  Final    Comment: Performed at [facility], [address] [place], [place] [address]  C. Diff by PCR, Reflexed     Status: Abnormal   Collection Time: 04/14/21  5:45 AM  Result Value Ref Range Status   Toxigenic C. Difficile by PCR POSITIVE (A) NEGATIVE Final    Comment: Positive for toxigenic C. difficile with little to no toxin production. Only treat if clinical presentation suggests symptomatic illness. Performed at [facility], [place], [place] [address]   Resp Panel by RT-PCR (Flu A&B, Covid) Nasopharyngeal Swab     Status: None   Collection Time: 04/14/21  6:51 AM   Specimen: Nasopharyngeal Swab; Nasopharyngeal(NP) swabs in vial transport medium  Result Value Ref Range Status   SARS Coronavirus 2 by RT PCR NEGATIVE NEGATIVE Final    Comment: (NOTE) SARS-CoV-2 target nucleic acids are NOT  DETECTED.  The SARS-CoV-2 RNA is generally detectable in upper respiratory specimens during the acute phase of infection. The lowest concentration of SARS-CoV-2 viral copies this assay can detect is 138 copies/mL. A negative result does not preclude SARS-Cov-2 infection and should not be used as the sole basis for treatment or other patient management decisions. A negative result may occur with  improper specimen collection/handling, submission of specimen other than nasopharyngeal swab, presence of viral mutation(s) within the areas targeted by this assay, and inadequate number of viral copies(<138 copies/mL). A negative result must be combined with clinical observations, patient history, and epidemiological information. The expected result is Negative.  Fact Sheet for Patients:  EntrepreneurPulse.com.au  Fact Sheet for Healthcare Providers:  IncredibleEmployment.be  This test is no t yet approved or cleared by the [place] FDA and  has been authorized for detection and/or diagnosis of SARS-CoV-2 by FDA under an Emergency Use Authorization (EUA). This EUA will remain  in effect (meaning this test can be used) for the duration of the COVID-19 declaration under Section 564(b)(1) of the Act, 21 U.S.C.section 360bbb-3(b)(1), unless the authorization is terminated  or revoked sooner.       Influenza A by PCR NEGATIVE NEGATIVE Final   Influenza B by PCR NEGATIVE NEGATIVE Final    Comment: (NOTE) The Xpert Xpress SARS-CoV-2/FLU/RSV plus assay is intended as an aid in the diagnosis of influenza from Nasopharyngeal swab specimens and should not be used as a sole basis for treatment. Nasal washings and aspirates are unacceptable for Xpert Xpress SARS-CoV-2/FLU/RSV testing.  Fact Sheet for Patients: EntrepreneurPulse.com.au  Fact Sheet for Healthcare Providers: IncredibleEmployment.be  This test is not yet  approved or cleared by the [place] FDA and has been authorized for detection and/or diagnosis of SARS-CoV-2 by FDA under an Emergency Use Authorization (EUA). This EUA will remain in effect (meaning this test can be used) for the duration of the COVID-19 declaration under Section 564(b)(1) of the Act, 21 U.S.C. section 360bbb-3(b)(1), unless the authorization is terminated or revoked.  Performed at [facility], [address] [place], [place] [address]   Culture, blood (routine x 2)     Status: None (Preliminary result)   Collection Time: 04/14/21  8:56 AM   Specimen: BLOOD  Result Value Ref Range Status   Specimen Description BLOOD BLOOD RIGHT HAND  Final   Special Requests   Final    BOTTLES DRAWN AEROBIC AND ANAEROBIC  Blood Culture adequate volume   Culture   Final    NO GROWTH < 24 HOURS Performed at [facility], [address] [place], [place] [address]    Report Status PENDING  Incomplete  Culture, blood (routine x 2)     Status: None (Preliminary result)   Collection Time: 04/14/21  8:56 AM   Specimen: BLOOD  Result Value Ref Range Status   Specimen Description BLOOD RIGHT ARM  Final   Special Requests   Final    BOTTLES DRAWN AEROBIC AND ANAEROBIC Blood Culture adequate volume   Culture   Final    NO GROWTH < 24 HOURS Performed at [facility], [address], [place], [place] [address]    Report Status PENDING  Incomplete  MRSA PCR Screening     Status: None   Collection Time: 04/14/21  5:05 PM   Specimen: Nasal Mucosa; Nasopharyngeal  Result Value Ref Range Status   MRSA by PCR NEGATIVE NEGATIVE Final    Comment:        The GeneXpert MRSA Assay (FDA approved for NASAL specimens only), is one component of a comprehensive MRSA colonization surveillance program. It is not intended to diagnose MRSA infection nor to guide or monitor treatment for MRSA infections. Performed at [facility], [address]  [place], [place] [address]   Gastrointestinal Panel by PCR , Stool     Status: Abnormal   Collection Time: 04/15/21  5:45 AM   Specimen: STOOL  Result Value Ref Range Status   Campylobacter species NOT DETECTED NOT DETECTED Final   Plesimonas shigelloides NOT DETECTED NOT DETECTED Final   Salmonella species NOT DETECTED NOT DETECTED Final   Yersinia enterocolitica NOT DETECTED NOT DETECTED Final   Vibrio species NOT DETECTED NOT DETECTED Final   Vibrio cholerae NOT DETECTED NOT DETECTED Final   Enteroaggregative E coli (EAEC) NOT DETECTED NOT DETECTED Final   Enteropathogenic E coli (EPEC) NOT DETECTED NOT DETECTED Final   Enterotoxigenic E coli (ETEC) NOT DETECTED NOT DETECTED Final   Shiga like toxin producing E coli (STEC) NOT DETECTED NOT DETECTED Final   Shigella/Enteroinvasive E coli (EIEC) NOT DETECTED NOT DETECTED Final   Cryptosporidium NOT DETECTED NOT DETECTED Final   Cyclospora cayetanensis NOT DETECTED NOT DETECTED Final   Entamoeba histolytica NOT DETECTED NOT DETECTED Final   Giardia lamblia NOT DETECTED NOT DETECTED Final   Adenovirus F40/41 NOT DETECTED NOT DETECTED Final   Astrovirus NOT DETECTED NOT DETECTED Final   Norovirus GI/GII DETECTED (A) NOT DETECTED Final    Comment: RESULT CALLED TO, READ BACK BY AND VERIFIED WITH: PATRICIA COPPEDGE 04/15/21 0750 SJL    Rotavirus A NOT DETECTED NOT DETECTED Final   Sapovirus (I, II, IV, and V) NOT DETECTED NOT DETECTED Final    Comment: Performed at [facility], [address], [place], [place] [address]         Radiology Studies: CT ABDOMEN PELVIS W CONTRAST  Result Date: 04/14/2021 CLINICAL DATA:  80 year old male with abdominal pain and headache. Nausea and diarrhea. EXAM: CT ABDOMEN AND PELVIS WITH CONTRAST TECHNIQUE: Multidetector CT imaging of the abdomen and pelvis was performed using the standard protocol following bolus administration of intravenous contrast. CONTRAST:  137m OMNIPAQUE IOHEXOL 300  MG/ML  SOLN COMPARISON:  Prior noncontrast CT Abdomen and Pelvis 10/10/2018 and earlier. FINDINGS: Lower chest: Calcified coronary artery atherosclerosis. Borderline to mild cardiomegaly. No pericardial effusion. Lung base scarring and/or atelectasis is stable since 2019. Stable mild elevation  of the left hemidiaphragm. No pleural effusion. Hepatobiliary: Nodular, cirrhotic liver. Gallbladder distension similar to 2019. No definite pericholecystic inflammation. No bile duct enlargement. No discrete liver lesion. Small caliber gastrohepatic ligament varices. Pancreas: Negative. Spleen: Nonenlarged, negative. There are prominent gastrosplenic ligament varices. Associated left spleno renal varices. Adrenals/Urinary Tract: Negative adrenal glands. Native renal atrophy. Renal vascular calcifications and occasional small cysts. No hydronephrosis or hydroureter. Diminutive urinary bladder. Stomach/Bowel: Decompressed rectosigmoid colon. Mild sigmoid diverticulosis. Decompressed descending colon. Gas in the transverse colon which appears negative. There is fluid in the right colon which is indistinct with circumferential wall thickening and the appendix is decompressed and appears to remain normal. Terminal ileum not definitely involved. Overall a roughly 10 cm segment of the right colon from the cecum to the ascending appears abnormal. No dilated small bowel. Decompressed stomach and duodenum. No free air. No free fluid. Mild regional mesenteric stranding (series 5, image 59). Vascular/Lymphatic: Calcified aortic atherosclerosis. Major arterial structures in the abdomen and pelvis are patent. There is calcified bilateral femoral artery atherosclerosis. Portal venous system is patent. Reproductive: 16 mm rim enhancing low to intermediate density fluid collection off center in the prostate on series 2, image 83. Prostate size appears stable since 2019, with no inflammation surrounding the prostate. This is not obviously in  communication with the prosthetic urethra, which is diminutive and difficult to identified. Negative visible inguinal canals. Other: No pelvic free fluid. Musculoskeletal: Prior plasty with bone cement of the bilateral iliac wings, stable. No acute osseous abnormality identified. IMPRESSION: 1. Inflamed 10 cm segment of the ascending colon and cecum compatible with acute colitis. Terminal ileum and appendix appear uninvolved. No perforation or complicating features identified. 2. 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 3. Small 1.6 cm rim enhancing fluid collection in the central prostate to the right of midline. No evidence of active prostatitis. 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 Electronically Signed   By: Genevie Ann M.D.   On: 04/14/2021 05:13        Scheduled Meds: . atorvastatin  10 mg Oral QHS  . fidaxomicin  200 mg Oral BID  . heparin  5,000 Units Subcutaneous Q8H  . sodium bicarbonate  650 mg Oral Daily   Continuous Infusions:   LOS: 1 day    Time spent: 40 minutes    Irine Seal, MD [facility]   To contact the attending provider between 7A-7P or the covering provider during after hours 7P-7A, please log into the web site [email or web address] and access using universal [place] password for that web site. If you do not have the password, please call the hospital operator.  04/15/2021, 6:01 PM

## 2021-04-15 NOTE — Progress Notes (Signed)
[facility]  ROUNDING NOTE   Subjective:  Patient well-known to [place] from prior admissions. Patient came in with abdominal pain, diarrhea, nausea, and vomiting. He states that his last dialysis treatment was on Thursday. Currently serum potassium is 4.7.   Objective:  Vital signs in last 24 hours:  Temp:  [97.4 F (36.3 C)-98.9 F (37.2 C)] 98.9 F (37.2 C) (05/01 2023) Pulse Rate:  [59-76] 74 (05/01 2023) Resp:  [14-18] 18 (05/01 2023) BP: (114-145)/(43-53) 145/52 (05/01 2023) SpO2:  [96 %-100 %] 99 % (05/01 2023) Weight:  [71.6 kg] 71.6 kg (05/01 0428)  Weight change:  Filed Weights   04/15/21 0428  Weight: 71.6 kg    Intake/Output: I/O last 3 completed shifts: In: 390 [P.O.:240; IV Piggyback:150] Out: -    Intake/Output this shift:  No intake/output data recorded.  Physical Exam: General:  No acute distress  Head:  Normocephalic, atraumatic. Moist oral mucosal membranes  Eyes:  Anicteric  Neck:  Supple  Lungs:   Clear to auscultation, normal effort  Heart:  S1S2 no rubs  Abdomen:   Soft, nontender, bowel sounds present  Extremities:  No peripheral edema.  Neurologic:  Awake, alert, following commands  Skin:  No lesions  Access:  Left upper extremity AV fistula    Basic Metabolic Panel: Recent Labs  Lab 04/13/21 2123 04/14/21 2023 04/15/21 1143  NA 135  --  131*  K 4.1  --  4.7  CL 100  --  97*  CO2 16*  --  17*  GLUCOSE 150*  --  164*  BUN 79*  --  98*  CREATININE 9.45* 10.84* 12.24*  CALCIUM 9.3  --  8.6*  MG  --   --  1.6*  PHOS  --   --  4.9*    Liver Function Tests: Recent Labs  Lab 04/13/21 2123  AST 23  ALT 24  ALKPHOS 214*  BILITOT 1.4*  PROT 7.7  ALBUMIN 3.5   Recent Labs  Lab 04/13/21 2123  LIPASE 30   No results for input(s): AMMONIA in the last 168 hours.  CBC: Recent Labs  Lab 04/13/21 2123 04/14/21 2023 04/15/21 1143  WBC 14.8* 14.9* 14.1*  HGB 13.4 12.2* 11.5*  HCT 39.9 36.5* 34.5*  MCV 96.4  97.3 96.6  PLT 221 172 171    Cardiac Enzymes: No results for input(s): CKTOTAL, CKMB, CKMBINDEX, TROPONINI in the last 168 hours.  BNP: Invalid input(s): POCBNP  CBG: Recent Labs  Lab 04/15/21 2117  GLUCAP 159*    Microbiology: Results for orders placed or performed during the hospital encounter of 04/14/21  C Difficile Quick Screen w PCR reflex     Status: Abnormal   Collection Time: 04/14/21  5:45 AM   Specimen: STOOL  Result Value Ref Range Status   C Diff antigen POSITIVE (A) NEGATIVE Final   C Diff toxin NEGATIVE NEGATIVE Final   C Diff interpretation Results are indeterminate. See PCR results.  Final    Comment: Performed at [facility], [address] [place], [place] [address]  C. Diff by PCR, Reflexed     Status: Abnormal   Collection Time: 04/14/21  5:45 AM  Result Value Ref Range Status   Toxigenic C. Difficile by PCR POSITIVE (A) NEGATIVE Final    Comment: Positive for toxigenic C. difficile with little to no toxin production. Only treat if clinical presentation suggests symptomatic illness. Performed at [facility], [address] [place], [place] [address]  Resp Panel by RT-PCR (Flu A&B, Covid) Nasopharyngeal Swab     Status: None   Collection Time: 04/14/21  6:51 AM   Specimen: Nasopharyngeal Swab; Nasopharyngeal(NP) swabs in vial transport medium  Result Value Ref Range Status   SARS Coronavirus 2 by RT PCR NEGATIVE NEGATIVE Final    Comment: (NOTE) SARS-CoV-2 target nucleic acids are NOT DETECTED.  The SARS-CoV-2 RNA is generally detectable in upper respiratory specimens during the acute phase of infection. The lowest concentration of SARS-CoV-2 viral copies this assay can detect is 138 copies/mL. A negative result does not preclude SARS-Cov-2 infection and should not be used as the sole basis for treatment or other patient management decisions. A negative result may occur with  improper specimen collection/handling,  submission of specimen other than nasopharyngeal swab, presence of viral mutation(s) within the areas targeted by this assay, and inadequate number of viral copies(<138 copies/mL). A negative result must be combined with clinical observations, patient history, and epidemiological information. The expected result is Negative.  Fact Sheet for Patients:  EntrepreneurPulse.com.au  Fact Sheet for Healthcare Providers:  IncredibleEmployment.be  This test is no t yet approved or cleared by the [place] FDA and  has been authorized for detection and/or diagnosis of SARS-CoV-2 by FDA under an Emergency Use Authorization (EUA). This EUA will remain  in effect (meaning this test can be used) for the duration of the COVID-19 declaration under Section 564(b)(1) of the Act, 21 U.S.C.section 360bbb-3(b)(1), unless the authorization is terminated  or revoked sooner.       Influenza A by PCR NEGATIVE NEGATIVE Final   Influenza B by PCR NEGATIVE NEGATIVE Final    Comment: (NOTE) The Xpert Xpress SARS-CoV-2/FLU/RSV plus assay is intended as an aid in the diagnosis of influenza from Nasopharyngeal swab specimens and should not be used as a sole basis for treatment. Nasal washings and aspirates are unacceptable for Xpert Xpress SARS-CoV-2/FLU/RSV testing.  Fact Sheet for Patients: EntrepreneurPulse.com.au  Fact Sheet for Healthcare Providers: IncredibleEmployment.be  This test is not yet approved or cleared by the [place] FDA and has been authorized for detection and/or diagnosis of SARS-CoV-2 by FDA under an Emergency Use Authorization (EUA). This EUA will remain in effect (meaning this test can be used) for the duration of the COVID-19 declaration under Section 564(b)(1) of the Act, 21 U.S.C. section 360bbb-3(b)(1), unless the authorization is terminated or revoked.  Performed at [facility], [address] [place], [place] [address]   Culture, blood (routine x 2)     Status: None (Preliminary result)   Collection Time: 04/14/21  8:56 AM   Specimen: BLOOD  Result Value Ref Range Status   Specimen Description BLOOD BLOOD RIGHT HAND  Final   Special Requests   Final    BOTTLES DRAWN AEROBIC AND ANAEROBIC Blood Culture adequate volume   Culture   Final    NO GROWTH < 24 HOURS Performed at [facility], [address], [place], [place] [address]    Report Status PENDING  Incomplete  Culture, blood (routine x 2)     Status: None (Preliminary result)   Collection Time: 04/14/21  8:56 AM   Specimen: BLOOD  Result Value Ref Range Status   Specimen Description BLOOD RIGHT ARM  Final   Special Requests   Final    BOTTLES DRAWN AEROBIC AND ANAEROBIC Blood Culture adequate volume   Culture   Final    NO GROWTH < 24 HOURS Performed at [facility], [address] [place], [place] [address]    Report Status PENDING  Incomplete  MRSA PCR Screening     Status: None   Collection Time: 04/14/21  5:05 PM   Specimen: Nasal Mucosa; Nasopharyngeal  Result Value Ref Range Status   MRSA by PCR NEGATIVE NEGATIVE Final    Comment:        The GeneXpert MRSA Assay (FDA approved for NASAL specimens only), is one component of a comprehensive MRSA colonization surveillance program. It is not intended to diagnose MRSA infection nor to guide or monitor treatment for MRSA infections. Performed at [facility], [address] [place], [place] [address]   Gastrointestinal Panel by PCR , Stool     Status: Abnormal   Collection Time: 04/15/21  5:45 AM   Specimen: STOOL  Result Value Ref Range Status   Campylobacter species NOT DETECTED NOT DETECTED Final   Plesimonas shigelloides NOT DETECTED NOT DETECTED Final   Salmonella species NOT DETECTED NOT DETECTED Final   Yersinia enterocolitica NOT DETECTED NOT DETECTED Final   Vibrio species NOT DETECTED NOT DETECTED  Final   Vibrio cholerae NOT DETECTED NOT DETECTED Final   Enteroaggregative E coli (EAEC) NOT DETECTED NOT DETECTED Final   Enteropathogenic E coli (EPEC) NOT DETECTED NOT DETECTED Final   Enterotoxigenic E coli (ETEC) NOT DETECTED NOT DETECTED Final   Shiga like toxin producing E coli (STEC) NOT DETECTED NOT DETECTED Final   Shigella/Enteroinvasive E coli (EIEC) NOT DETECTED NOT DETECTED Final   Cryptosporidium NOT DETECTED NOT DETECTED Final   Cyclospora cayetanensis NOT DETECTED NOT DETECTED Final   Entamoeba histolytica NOT DETECTED NOT DETECTED Final   Giardia lamblia NOT DETECTED NOT DETECTED Final   Adenovirus F40/41 NOT DETECTED NOT DETECTED Final   Astrovirus NOT DETECTED NOT DETECTED Final   Norovirus GI/GII DETECTED (A) NOT DETECTED Final    Comment: RESULT CALLED TO, READ BACK BY AND VERIFIED WITH: PATRICIA COPPEDGE 04/15/21 0750 SJL    Rotavirus A NOT DETECTED NOT DETECTED Final   Sapovirus (I, II, IV, and V) NOT DETECTED NOT DETECTED Final    Comment: Performed at [facility], [address] [place], [place] [address]    Coagulation Studies: No results for input(s): LABPROT, INR in the last 72 hours.  Urinalysis: No results for input(s): COLORURINE, LABSPEC, PHURINE, GLUCOSEU, HGBUR, BILIRUBINUR, KETONESUR, PROTEINUR, UROBILINOGEN, NITRITE, LEUKOCYTESUR in the last 72 hours.  Invalid input(s): APPERANCEUR    Imaging: CT ABDOMEN PELVIS W CONTRAST  Result Date: 04/14/2021 CLINICAL DATA:  80 year old male with abdominal pain and headache. Nausea and diarrhea. EXAM: CT ABDOMEN AND PELVIS WITH CONTRAST TECHNIQUE: Multidetector CT imaging of the abdomen and pelvis was performed using the standard protocol following bolus administration of intravenous contrast. CONTRAST:  110m OMNIPAQUE IOHEXOL 300 MG/ML  SOLN COMPARISON:  Prior noncontrast CT Abdomen and Pelvis 10/10/2018 and earlier. FINDINGS: Lower chest: Calcified coronary artery atherosclerosis. Borderline  to mild cardiomegaly. No pericardial effusion. Lung base scarring and/or atelectasis is stable since 2019. Stable mild elevation of the left hemidiaphragm. No pleural effusion. Hepatobiliary: Nodular, cirrhotic liver. Gallbladder distension similar to 2019. No definite pericholecystic inflammation. No bile duct enlargement. No discrete liver lesion. Small caliber gastrohepatic ligament varices. Pancreas: Negative. Spleen: Nonenlarged, negative. There are prominent gastrosplenic ligament varices. Associated left spleno renal varices. Adrenals/Urinary Tract: Negative adrenal glands. Native renal atrophy. Renal vascular calcifications and occasional small cysts. No hydronephrosis or hydroureter. Diminutive urinary bladder. Stomach/Bowel: Decompressed rectosigmoid colon. Mild sigmoid diverticulosis. Decompressed descending colon. Gas  in the transverse colon which appears negative. There is fluid in the right colon which is indistinct with circumferential wall thickening and the appendix is decompressed and appears to remain normal. Terminal ileum not definitely involved. Overall a roughly 10 cm segment of the right colon from the cecum to the ascending appears abnormal. No dilated small bowel. Decompressed stomach and duodenum. No free air. No free fluid. Mild regional mesenteric stranding (series 5, image 59). Vascular/Lymphatic: Calcified aortic atherosclerosis. Major arterial structures in the abdomen and pelvis are patent. There is calcified bilateral femoral artery atherosclerosis. Portal venous system is patent. Reproductive: 16 mm rim enhancing low to intermediate density fluid collection off center in the prostate on series 2, image 83. Prostate size appears stable since 2019, with no inflammation surrounding the prostate. This is not obviously in communication with the prosthetic urethra, which is diminutive and difficult to identified. Negative visible inguinal canals. Other: No pelvic free fluid.  Musculoskeletal: Prior plasty with bone cement of the bilateral iliac wings, stable. No acute osseous abnormality identified. IMPRESSION: 1. Inflamed 10 cm segment of the ascending colon and cecum compatible with acute colitis. Terminal ileum and appendix appear uninvolved. No perforation or complicating features identified. 2. 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 3. Small 1.6 cm rim enhancing fluid collection in the central prostate to the right of midline. No evidence of active prostatitis. 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 Electronically Signed   By: Genevie Ann M.D.   On: 04/14/2021 05:13     Medications:    . atorvastatin  10 mg Oral QHS  . fidaxomicin  200 mg Oral BID  . heparin  5,000 Units Subcutaneous Q8H  . [START ON 04/16/2021] insulin aspart  0-6 Units Subcutaneous TID WC  . sodium bicarbonate  650 mg Oral Daily   acetaminophen  Assessment/ Plan:  80 y.o. male with past medical history of ESRD on HD TTS, hypertension, diabetes mellitus type 2 who presented with protracted nausea, vomiting, and relative hypotension upon admission.  Found to be positive for norovirus as well as PCR positive for C. difficile antigen.  1.  ESRD on HD TTS.  His most recent dialysis session was on Thursday.  However presents with nausea, vomiting, and had hypotension upon admission.  Hold off on dialysis today given these findings.  No hyperkalemia noted now.  We will plan for hemodialysis treatment again tomorrow.  2.  Anemia of chronic kidney disease.  Hemoglobin currently 11.5.  Hold off on Epogen for now.  3.  Secondary hyperparathyroidism.  Phosphorus currently 4.9.  Hold off on adding phosphorus binders at this time given acute GI disturbance.  4.  C. difficile colitis/norovirus colitis.  Patient currently on fidaxomicin.  Otherwise continue supportive care.   LOS: 1 [employer] 5/1/202210:31 PM

## 2021-04-15 NOTE — Evaluation (Signed)
Physical Therapy Evaluation Patient Details Name: Martin Gilbert MRN: [id number] DOB: 04/13/1941 Today's Date: 04/15/2021   History of Present Illness     Pt admitted to [facility] on 04/14/21 for c/o RLQ abdominal pain over the past 7 days, with associated nausea/vomiting and watery diarrhea due to Norovirus. Significant PMH includes: ESRD (on HD TTS), diabetes, Cdiff colitis, HTN, BPH, CKD (stage IV), GERD, and HLD. Imaging revealed inflamed segment of ascending colon and cecum compatible with acute colitis.  Clinical Impression  Pt is a 80 year old M admitted to hospital on 4/30 for symptoms consistent with Norovirus. In-person interpretive services utilized today via [place]. At baseline, pt is mod I with ADL's, household ambulation with RW, cooking, WC for community ambulation, and medication management; family to assist with IADL's and transportation. Pt presents with functional weakness, poor safety awareness, mild confusion, decreased activity tolerance, and decreased standing balance resulting in impaired functional mobility from baseline. Due to deficits, pt required mod I for bed mobility, CGA for transfers with RW, and CGA for short distance ambulation with RW. Increased time required during session, as pt speaks [place] dialect of spanish and son was translating interpreters interpretation. Deficits limit the pt's ability to safely and independently perform ADL's, transfer, and ambulate. Pt will benefit from acute skilled PT services to address deficits for return to baseline function. At this time, PT recommends DC home with supervision for mobility/OOB and HHPT to address deficits to improve overall safety with functional mobility and decrease caregiver burden.     Follow Up Recommendations Home health PT;Supervision for mobility/OOB    Equipment Recommendations  None recommended by PT    Recommendations for Other Services       Precautions / Restrictions  Precautions Precautions: Fall Precaution Comments: Contact precautions (Norovirus) Restrictions Weight Bearing Restrictions: No      Mobility  Bed Mobility Overal bed mobility: Modified Independent             General bed mobility comments: Mod I for supine<>sit transfer requiring increased time/effort and use of handrail    Transfers Overall transfer level: Needs assistance Equipment used: Rolling walker (2 wheeled) Transfers: Sit to/from Stand Sit to Stand: Min guard         General transfer comment: CGA for safety for STS transfers from EOB with RW. Multimodal cues for hand placement and RW proximity.  Ambulation/Gait Ambulation/Gait assistance: Min guard Gait Distance (Feet): 40 Feet (62f x2 (in room)) Assistive device: Rolling walker (2 wheeled)       General Gait Details: Unable to leave room due to new Norovirus dx. Pt required CGA for safety to ambulate short distance in room with RW. Pt demonstrates slowed cadence, decreased RW proximity, fair balance, early reciprocal gait, and decreased step length/foot clearance bil. Multimodal cues provided for RW proximity.     Balance Overall balance assessment: Needs assistance Sitting-balance support: No upper extremity supported;Feet supported Sitting balance-Leahy Scale: Normal Sitting balance - Comments: No LOB at EOB   Standing balance support: Bilateral upper extremity supported;During functional activity Standing balance-Leahy Scale: Fair Standing balance comment: Fair standing balance in RW                             Pertinent Vitals/Pain Pain Assessment: No/denies pain    Home Living Family/patient expects to be discharged to:: Private residence Living Arrangements: Children;Spouse/significant other Available Help at Discharge: Family;Available 24 hours/day (family drops by throughout the day; has sitter  at night. Wife is blind and on HD) Type of Home: House Home Access: Level entry      Home Layout: One level Home Equipment: [place] - 4 wheels;Bedside commode;Shower seat;Walker - 2 wheels Additional Comments: History and PLOF from chart review and patient with assistance from interpreter    Prior Function Level of Independence: Independent with assistive device(s)         Comments: Mod Ind ambulation household distances with a RW. Mod I with ADL's, cooking, and medication management. Family to assist with cleaning and transportation. Uses WC for community ambulation.     Hand Dominance   Dominant Hand: Right    Extremity/Trunk Assessment   Upper Extremity Assessment Upper Extremity Assessment: Overall WFL for tasks assessed    Lower Extremity Assessment Lower Extremity Assessment: Overall WFL for tasks assessed    Cervical / Trunk Assessment Cervical / Trunk Assessment: Normal  Communication   Communication: Interpreter utilized;Other (comment) (Rafael in-person interpreter; son was interpreting Rafael's interpretation of PT, as pt uses [place])  Cognition Arousal/Alertness: Awake/alert Behavior During Therapy: WFL for tasks assessed/performed Overall Cognitive Status: Difficult to assess                                 General Comments: Pt was A&O x3, unable to state time. Seemed confused throughout session, unable to answer some questioning and perseverating on wife being blind and on HD      General Comments      Exercises Other Exercises Other Exercises: Pt able to participate in bed mobility, transfers, and gait with RW. Pt required grossly CGA with multimodal cues for hand placement and RW proximity due to poor safety awareness. Other Exercises: Pt and son educated regarding: PT role/POC, DC recommendations, and safety with mobility in RW. Son verbalized understanding.   Assessment/Plan    PT Assessment Patient needs continued PT services  PT Problem List Decreased mobility;Decreased safety awareness;Decreased  knowledge of precautions;Decreased activity tolerance;Decreased balance       PT Treatment Interventions Gait training;Stair training;Functional mobility training;Therapeutic activities;Therapeutic exercise;Balance training;Neuromuscular re-education    PT Goals (Current goals can be found in the Care Plan section)  Acute Rehab PT Goals Patient Stated Goal: "to go home" PT Goal Formulation: With patient/family Time For Goal Achievement: 04/29/21 Potential to Achieve Goals: Good Additional Goals Additional Goal #1: Pt will be able to tolerate at least 8 minutes of continuous activity to improve overall safety with functional mobility and decrease caregiver burden at DC.    Frequency Min 2X/week    AM-PAC PT "6 Clicks" Mobility  Outcome Measure Help needed turning from your back to your side while in a flat bed without using bedrails?: None Help needed moving from lying on your back to sitting on the side of a flat bed without using bedrails?: None Help needed moving to and from a bed to a chair (including a wheelchair)?: A Little Help needed standing up from a chair using your arms (e.g., wheelchair or bedside chair)?: A Little Help needed to walk in hospital room?: A Little Help needed climbing 3-5 steps with a railing? : A Little 6 Click Score: 20    End of Session Equipment Utilized During Treatment: Gait belt Activity Tolerance: Patient tolerated treatment well Patient left: in bed;with call bell/phone within reach;with family/visitor present Nurse Communication: Mobility status PT Visit Diagnosis: Unsteadiness on feet (R26.81);Difficulty in walking, not elsewhere classified (R26.2)  Time: [id number] PT Time Calculation (min) (ACUTE ONLY): 38 min   Charges:   PT Evaluation $PT Eval Moderate Complexity: 1 Mod PT Treatments $Therapeutic Exercise: 8-22 mins       Herminio Commons, PT, DPT 4:16 PM,04/15/21

## 2021-04-16 ENCOUNTER — Other Ambulatory Visit (HOSPITAL_COMMUNITY): Payer: Self-pay

## 2021-04-16 DIAGNOSIS — A0811 Acute gastroenteropathy due to Norwalk agent: Principal | ICD-10-CM

## 2021-04-16 DIAGNOSIS — E1165 Type 2 diabetes mellitus with hyperglycemia: Secondary | ICD-10-CM

## 2021-04-16 DIAGNOSIS — E860 Dehydration: Secondary | ICD-10-CM | POA: Diagnosis not present

## 2021-04-16 DIAGNOSIS — A0472 Enterocolitis due to Clostridium difficile, not specified as recurrent: Secondary | ICD-10-CM | POA: Diagnosis not present

## 2021-04-16 DIAGNOSIS — K529 Noninfective gastroenteritis and colitis, unspecified: Secondary | ICD-10-CM | POA: Diagnosis not present

## 2021-04-16 LAB — RENAL FUNCTION PANEL
Albumin: 2.6 g/dL — ABNORMAL LOW (ref 3.5–5.0)
Anion gap: 16 — ABNORMAL HIGH (ref 5–15)
BUN: 111 mg/dL — ABNORMAL HIGH (ref 8–23)
CO2: 17 mmol/L — ABNORMAL LOW (ref 22–32)
Calcium: 8.7 mg/dL — ABNORMAL LOW (ref 8.9–10.3)
Chloride: 98 mmol/L (ref 98–111)
Creatinine, Ser: 13.5 mg/dL — ABNORMAL HIGH (ref 0.61–1.24)
GFR, Estimated: 3 mL/min — ABNORMAL LOW (ref >60)
Glucose, Bld: 92 mg/dL (ref 70–99)
Phosphorus: 5 mg/dL — ABNORMAL HIGH (ref 2.5–4.6)
Potassium: 5.3 mmol/L — ABNORMAL HIGH (ref 3.5–5.1)
Sodium: 131 mmol/L — ABNORMAL LOW (ref 135–145)

## 2021-04-16 LAB — CBC WITH DIFFERENTIAL/PLATELET
Abs Immature Granulocytes: 0.04 10*3/uL (ref 0.00–0.07)
Basophils Absolute: 0 10*3/uL (ref 0.0–0.1)
Basophils Relative: 0 %
Eosinophils Absolute: 0.3 10*3/uL (ref 0.0–0.5)
Eosinophils Relative: 3 %
HCT: 33.2 % — ABNORMAL LOW (ref 39.0–52.0)
Hemoglobin: 11.4 g/dL — ABNORMAL LOW (ref 13.0–17.0)
Immature Granulocytes: 1 %
Lymphocytes Relative: 24 %
Lymphs Abs: 2 10*3/uL (ref 0.7–4.0)
MCH: 32.2 pg (ref 26.0–34.0)
MCHC: 34.3 g/dL (ref 30.0–36.0)
MCV: 93.8 fL (ref 80.0–100.0)
Monocytes Absolute: 0.5 10*3/uL (ref 0.1–1.0)
Monocytes Relative: 6 %
Neutro Abs: 5.5 10*3/uL (ref 1.7–7.7)
Neutrophils Relative %: 66 %
Platelets: 183 10*3/uL (ref 150–400)
RBC: 3.54 MIL/uL — ABNORMAL LOW (ref 4.22–5.81)
RDW: 13.9 % (ref 11.5–15.5)
WBC: 8.3 10*3/uL (ref 4.0–10.5)
nRBC: 0 % (ref 0.0–0.2)

## 2021-04-16 LAB — HEPATITIS B SURFACE ANTIGEN: Hepatitis B Surface Ag: NONREACTIVE

## 2021-04-16 LAB — HEMOGLOBIN A1C
Hgb A1c MFr Bld: 5.6 % (ref 4.8–5.6)
Mean Plasma Glucose: 114.02 mg/dL

## 2021-04-16 LAB — GLUCOSE, CAPILLARY
Glucose-Capillary: 94 mg/dL (ref 70–99)
Glucose-Capillary: 177 mg/dL — ABNORMAL HIGH (ref 70–99)

## 2021-04-16 LAB — MAGNESIUM: Magnesium: 2 mg/dL (ref 1.7–2.4)

## 2021-04-16 MED ORDER — TAMSULOSIN HCL 0.4 MG PO CAPS
0.4000 mg | ORAL_CAPSULE | Freq: Every day | ORAL | Status: DC
  Administered 2021-04-16: 0.4 mg via ORAL
  Filled 2021-04-16 (×2): qty 1

## 2021-04-16 MED ORDER — FAMOTIDINE 20 MG PO TABS: 20.0000 mg | ORAL_TABLET | Freq: Every day | ORAL | 1 refills | Status: DC

## 2021-04-16 MED ORDER — SODIUM BICARBONATE 650 MG PO TABS: 650.0000 mg | ORAL_TABLET | Freq: Every day | ORAL | 0 refills | Status: DC

## 2021-04-16 MED ORDER — VANCOMYCIN 50 MG/ML ORAL SOLUTION
125.0000 mg | Freq: Four times a day (QID) | ORAL | Status: DC
  Administered 2021-04-16: 125 mg via ORAL
  Filled 2021-04-16 (×2): qty 2.5

## 2021-04-16 MED ORDER — CHLORHEXIDINE GLUCONATE CLOTH 2 % EX PADS: 6.0000 | MEDICATED_PAD | Freq: Every day | CUTANEOUS | Status: DC

## 2021-04-16 MED ORDER — VANCOMYCIN 50 MG/ML ORAL SOLUTION: 125.0000 mg | Freq: Four times a day (QID) | ORAL | 0 refills | Status: AC

## 2021-04-16 MED ORDER — LACTULOSE 10 GM/15ML PO SOLN
ORAL | 0 refills | Status: DC
Start: 2021-04-28 — End: 2022-02-06

## 2021-04-16 NOTE — Plan of Care (Signed)
  Problem: Activity: Goal: Risk for activity intolerance will decrease 04/16/2021 1906 by Vivien Rota, RN Outcome: Adequate for Discharge 04/16/2021 1240 by Vivien Rota, RN Outcome: Progressing   Problem: Elimination: Goal: Will not experience complications related to bowel motility 04/16/2021 1906 by Vivien Rota, RN Outcome: Adequate for Discharge 04/16/2021 1240 by Vivien Rota, RN Outcome: Progressing Goal: Will not experience complications related to urinary retention 04/16/2021 1906 by Vivien Rota, RN Outcome: Adequate for Discharge 04/16/2021 1240 by Vivien Rota, RN Outcome: Progressing   Problem: Pain Managment: Goal: General experience of comfort will improve 04/16/2021 1906 by Vivien Rota, RN Outcome: Adequate for Discharge 04/16/2021 1240 by Vivien Rota, RN Outcome: Progressing   Problem: Safety: Goal: Ability to remain free from injury will improve 04/16/2021 1906 by Vivien Rota, RN Outcome: Adequate for Discharge 04/16/2021 1240 by Vivien Rota, RN Outcome: Progressing

## 2021-04-16 NOTE — Consult Note (Signed)
PHARMACY CONSULT NOTE  Pharmacy Consult for Electrolyte Monitoring and Replacement   Recent Labs: Potassium (mmol/L)  Date Value  04/16/2021 5.3 (H)  04/08/2015 4.0   Magnesium (mg/dL)  Date Value  04/16/2021 2.0   Calcium (mg/dL)  Date Value  04/16/2021 8.7 (L)   Calcium, Total (mg/dL)  Date Value  04/08/2015 8.5 (L)   Albumin (g/dL)  Date Value  04/16/2021 2.6 (L)  04/09/2015 3.1 (L)   Phosphorus (mg/dL)  Date Value  04/16/2021 5.0 (H)   Sodium (mmol/L)  Date Value  04/16/2021 131 (L)  04/08/2015 138   Assessment: Patient is an 80 y/o M with medical history including ESRD on HD Tu/Th/Sa, C diff colitis, diabetes, BPH with LUTS, gout, HTN, HLD, GERD who is admitted with acute on chronic colitis. Pharmacy consulted to assist with electrolyte monitoring and replacement as indicated.  Goal of Therapy:  Electrolytes within normal limits  Plan:  Mg 2.0 - no replenishment warranted  Phos 5.0 - nephrology holding phosphate binders per acute GI disturbance K 5.3  - possible HD today to manage per nephrology note - will follow  --Will follow-up electrolytes with AM labs tomorrow  Lu Duffel, PharmD, BCPS Clinical Pharmacist 04/16/2021 8:48 AM

## 2021-04-16 NOTE — TOC Benefit Eligibility Note (Signed)
Patient Advocate Encounter  Received notification  that the request for prior authorization for Dificid 200 mg tablets has been denied due to must try Vancomycin and Metronidazole first..        Lyndel Safe, [place] Patient Advocate Specialist [place] Team Direct Number: [phone]  Fax: [phone]

## 2021-04-16 NOTE — Plan of Care (Signed)
  Problem: Activity: Goal: Risk for activity intolerance will decrease Outcome: Progressing   Problem: Elimination: Goal: Will not experience complications related to bowel motility Outcome: Progressing Goal: Will not experience complications related to urinary retention Outcome: Progressing   Problem: Pain Managment: Goal: General experience of comfort will improve Outcome: Progressing   Problem: Safety: Goal: Ability to remain free from injury will improve Outcome: Progressing   

## 2021-04-16 NOTE — Progress Notes (Signed)
[facility]  ROUNDING NOTE   Subjective:  Patient is a current [facility] patient Spanish speaking Alert and oriented Denies pain and discomfort Tolerating meals without nausea Spoke with patient using appropriate interpretor   Objective:  Vital signs in last 24 hours:  Temp:  [98.1 F (36.7 C)-98.9 F (37.2 C)] 98.1 F (36.7 C) (05/02 1005) Pulse Rate:  [52-74] 52 (05/02 1330) Resp:  [14-21] 15 (05/02 1330) BP: (126-157)/(35-75) 141/57 (05/02 1330) SpO2:  [92 %-99 %] 98 % (05/02 0737) Weight:  [71.9 kg] 71.9 kg (05/02 0413)  Weight change: 0.3 kg Filed Weights   04/15/21 0428 04/16/21 0413  Weight: 71.6 kg 71.9 kg    Intake/Output: I/O last 3 completed shifts: In: 390 [P.O.:240; IV Piggyback:150] Out: -    Intake/Output this shift:  No intake/output data recorded.  Physical Exam: General:  No acute distress  Head:  Normocephalic, atraumatic. Moist oral mucosal membranes  Eyes:  Anicteric  Neck:  Supple  Lungs:   Clear to auscultation, normal effort  Heart:  S1S2 no rubs  Abdomen:   Soft, nontender, bowel sounds present  Extremities:  No peripheral edema.  Neurologic:  Awake, alert, following commands  Skin:  No lesions  Access:  Left upper extremity AV fistula    Basic Metabolic Panel: Recent Labs  Lab 04/13/21 2123 04/14/21 2023 04/15/21 1143 04/16/21 0408  NA 135  --  131* 131*  K 4.1  --  4.7 5.3*  CL 100  --  97* 98  CO2 16*  --  17* 17*  GLUCOSE 150*  --  164* 92  BUN 79*  --  98* 111*  CREATININE 9.45* 10.84* 12.24* 13.50*  CALCIUM 9.3  --  8.6* 8.7*  MG  --   --  1.6* 2.0  PHOS  --   --  4.9* 5.0*    Liver Function Tests: Recent Labs  Lab 04/13/21 2123 04/16/21 0408  AST 23  --   ALT 24  --   ALKPHOS 214*  --   BILITOT 1.4*  --   PROT 7.7  --   ALBUMIN 3.5 2.6*   Recent Labs  Lab 04/13/21 2123  LIPASE 30   No results for input(s): AMMONIA in the last 168 hours.  CBC: Recent Labs  Lab 04/13/21 2123  04/14/21 2023 04/15/21 1143 04/16/21 0408  WBC 14.8* 14.9* 14.1* 8.3  NEUTROABS  --   --   --  5.5  HGB 13.4 12.2* 11.5* 11.4*  HCT 39.9 36.5* 34.5* 33.2*  MCV 96.4 97.3 96.6 93.8  PLT 221 172 171 183    Cardiac Enzymes: No results for input(s): CKTOTAL, CKMB, CKMBINDEX, TROPONINI in the last 168 hours.  BNP: Invalid input(s): POCBNP  CBG: Recent Labs  Lab 04/15/21 2117 04/16/21 0805  GLUCAP 159* 25    Microbiology: Results for orders placed or performed during the hospital encounter of 04/14/21  C Difficile Quick Screen w PCR reflex     Status: Abnormal   Collection Time: 04/14/21  5:45 AM   Specimen: STOOL  Result Value Ref Range Status   C Diff antigen POSITIVE (A) NEGATIVE Final   C Diff toxin NEGATIVE NEGATIVE Final   C Diff interpretation Results are indeterminate. See PCR results.  Final    Comment: Performed at [facility], [address] [place], [place] [address]  C. Diff by PCR, Reflexed     Status: Abnormal   Collection Time: 04/14/21  5:45 AM  Result Value Ref  Range Status   Toxigenic C. Difficile by PCR POSITIVE (A) NEGATIVE Final    Comment: Positive for toxigenic C. difficile with little to no toxin production. Only treat if clinical presentation suggests symptomatic illness. Performed at [facility], [place], [place] [address]   Resp Panel by RT-PCR (Flu A&B, Covid) Nasopharyngeal Swab     Status: None   Collection Time: 04/14/21  6:51 AM   Specimen: Nasopharyngeal Swab; Nasopharyngeal(NP) swabs in vial transport medium  Result Value Ref Range Status   SARS Coronavirus 2 by RT PCR NEGATIVE NEGATIVE Final    Comment: (NOTE) SARS-CoV-2 target nucleic acids are NOT DETECTED.  The SARS-CoV-2 RNA is generally detectable in upper respiratory specimens during the acute phase of infection. The lowest concentration of SARS-CoV-2 viral copies this assay can detect is 138 copies/mL. A negative result does not preclude  SARS-Cov-2 infection and should not be used as the sole basis for treatment or other patient management decisions. A negative result may occur with  improper specimen collection/handling, submission of specimen other than nasopharyngeal swab, presence of viral mutation(s) within the areas targeted by this assay, and inadequate number of viral copies(<138 copies/mL). A negative result must be combined with clinical observations, patient history, and epidemiological information. The expected result is Negative.  Fact Sheet for Patients:  EntrepreneurPulse.com.au  Fact Sheet for Healthcare Providers:  IncredibleEmployment.be  This test is no t yet approved or cleared by the [place] FDA and  has been authorized for detection and/or diagnosis of SARS-CoV-2 by FDA under an Emergency Use Authorization (EUA). This EUA will remain  in effect (meaning this test can be used) for the duration of the COVID-19 declaration under Section 564(b)(1) of the Act, 21 U.S.C.section 360bbb-3(b)(1), unless the authorization is terminated  or revoked sooner.       Influenza A by PCR NEGATIVE NEGATIVE Final   Influenza B by PCR NEGATIVE NEGATIVE Final    Comment: (NOTE) The Xpert Xpress SARS-CoV-2/FLU/RSV plus assay is intended as an aid in the diagnosis of influenza from Nasopharyngeal swab specimens and should not be used as a sole basis for treatment. Nasal washings and aspirates are unacceptable for Xpert Xpress SARS-CoV-2/FLU/RSV testing.  Fact Sheet for Patients: EntrepreneurPulse.com.au  Fact Sheet for Healthcare Providers: IncredibleEmployment.be  This test is not yet approved or cleared by the [place] FDA and has been authorized for detection and/or diagnosis of SARS-CoV-2 by FDA under an Emergency Use Authorization (EUA). This EUA will remain in effect (meaning this test can be used) for the duration of  the COVID-19 declaration under Section 564(b)(1) of the Act, 21 U.S.C. section 360bbb-3(b)(1), unless the authorization is terminated or revoked.  Performed at [facility], [address] [place], [place] [address]   Culture, blood (routine x 2)     Status: None (Preliminary result)   Collection Time: 04/14/21  8:56 AM   Specimen: BLOOD  Result Value Ref Range Status   Specimen Description BLOOD BLOOD RIGHT HAND  Final   Special Requests   Final    BOTTLES DRAWN AEROBIC AND ANAEROBIC Blood Culture adequate volume   Culture   Final    NO GROWTH 2 DAYS Performed at [facility], [address], [place], [place] [address]    Report Status PENDING  Incomplete  Culture, blood (routine x 2)     Status: None (Preliminary result)   Collection Time: 04/14/21  8:56 AM   Specimen: BLOOD  Result Value Ref Range  Status   Specimen Description BLOOD RIGHT ARM  Final   Special Requests   Final    BOTTLES DRAWN AEROBIC AND ANAEROBIC Blood Culture adequate volume   Culture   Final    NO GROWTH 2 DAYS Performed at [facility], [address] [place], [place] [address]    Report Status PENDING  Incomplete  MRSA PCR Screening     Status: None   Collection Time: 04/14/21  5:05 PM   Specimen: Nasal Mucosa; Nasopharyngeal  Result Value Ref Range Status   MRSA by PCR NEGATIVE NEGATIVE Final    Comment:        The GeneXpert MRSA Assay (FDA approved for NASAL specimens only), is one component of a comprehensive MRSA colonization surveillance program. It is not intended to diagnose MRSA infection nor to guide or monitor treatment for MRSA infections. Performed at [facility], [address] [place], [place] [address]   Gastrointestinal Panel by PCR , Stool     Status: Abnormal   Collection Time: 04/15/21  5:45 AM   Specimen: STOOL  Result Value Ref Range Status   Campylobacter species NOT DETECTED NOT DETECTED Final   Plesimonas shigelloides NOT  DETECTED NOT DETECTED Final   Salmonella species NOT DETECTED NOT DETECTED Final   Yersinia enterocolitica NOT DETECTED NOT DETECTED Final   Vibrio species NOT DETECTED NOT DETECTED Final   Vibrio cholerae NOT DETECTED NOT DETECTED Final   Enteroaggregative E coli (EAEC) NOT DETECTED NOT DETECTED Final   Enteropathogenic E coli (EPEC) NOT DETECTED NOT DETECTED Final   Enterotoxigenic E coli (ETEC) NOT DETECTED NOT DETECTED Final   Shiga like toxin producing E coli (STEC) NOT DETECTED NOT DETECTED Final   Shigella/Enteroinvasive E coli (EIEC) NOT DETECTED NOT DETECTED Final   Cryptosporidium NOT DETECTED NOT DETECTED Final   Cyclospora cayetanensis NOT DETECTED NOT DETECTED Final   Entamoeba histolytica NOT DETECTED NOT DETECTED Final   Giardia lamblia NOT DETECTED NOT DETECTED Final   Adenovirus F40/41 NOT DETECTED NOT DETECTED Final   Astrovirus NOT DETECTED NOT DETECTED Final   Norovirus GI/GII DETECTED (A) NOT DETECTED Final    Comment: RESULT CALLED TO, READ BACK BY AND VERIFIED WITH: PATRICIA COPPEDGE 04/15/21 0750 SJL    Rotavirus A NOT DETECTED NOT DETECTED Final   Sapovirus (I, II, IV, and V) NOT DETECTED NOT DETECTED Final    Comment: Performed at [facility], [address] [place], [place] [address]    Coagulation Studies: No results for input(s): LABPROT, INR in the last 72 hours.  Urinalysis: No results for input(s): COLORURINE, LABSPEC, PHURINE, GLUCOSEU, HGBUR, BILIRUBINUR, KETONESUR, PROTEINUR, UROBILINOGEN, NITRITE, LEUKOCYTESUR in the last 72 hours.  Invalid input(s): APPERANCEUR    Imaging: No results found.   Medications:    . atorvastatin  10 mg Oral QHS  . Chlorhexidine Gluconate Cloth  6 each Topical Q0600  . fidaxomicin  200 mg Oral BID  . heparin  5,000 Units Subcutaneous Q8H  . insulin aspart  0-6 Units Subcutaneous TID WC  . sodium bicarbonate  650 mg Oral Daily  . tamsulosin  0.4 mg Oral Daily   acetaminophen  Assessment/  Plan:  80 y.o. male with past medical history of ESRD on HD TTS, hypertension, diabetes mellitus type 2 who presented with protracted nausea, vomiting, and relative hypotension upon admission.  Found to be positive for norovirus as well as PCR positive for C. difficile antigen.  [facility]/ TTS/ LLE AVF/73 kg  1.  ESRD on HD TTS.   Last dialysis treatment was Thursday, 04/12/21. Scheduled for dialysis today Light UF 0.5L based on presentation of nausea, vomiting and hypotension. Next treatment will be tomorrow to maintain outpatient schedule   2.  Anemia of chronic kidney disease.  Hemoglobin currently 11.5.  EPO held at this time Hbg at goal  3.  Secondary hyperparathyroidism.  Phosphorus currently 5.0.  Holding phosphorus binders based on current GI upset.  4.  C. difficile colitis/norovirus colitis.  Fidaxomicin prescribed Supportive care    LOS: 2 [place] 5/2/20221:50 PM

## 2021-04-16 NOTE — Discharge Summary (Signed)
Physician Discharge Summary  Martin Gilbert YHC:[id number] DOB: 04/19/1941 DOA: 04/14/2021  PCP: [name], MD  Admit date: 04/14/2021 Discharge date: 04/16/2021  Time spent: 55 minutes  Recommendations for Outpatient Follow-up:  1. Follow-up with [name], MD in 2 weeks.  On follow-up patient will need a basic metabolic profile, magnesium level checked to follow-up on electrolytes, renal function. 2. Follow-up at regular hemodialysis center on 04/17/2021.   Discharge Diagnoses:  Principal Problem:   Acute colitis Active Problems:   C. difficile colitis   Diabetes type 2, uncontrolled ([place])   Hypertension   ESRD (end stage renal disease) ([place])   Acute gastroenteritis   Hypotension   Dehydration   Hypomagnesemia   Norovirus   Discharge Condition: Stable and improved  Diet recommendation: Renal diet/heart healthy  Filed Weights   04/15/21 0428 04/16/21 0413  Weight: 71.6 kg 71.9 kg    History of present illness:  HPI per Dr. Lanney Gins 80 year old male with significant past medical history as below presenting to the ED with chief complaints of right lower quadrant abdominal pain, several episodes of diarrhea nausea and vomiting x7 days.  ED Course: On arrival to the ED, he was afebrile with blood pressure 85/41 mm Hg and pulse rate 91 beats/min, sats 96% on room air. There were no focal neurological deficits; he was alert and oriented per ED reports.  He was noted to be tender to palpation on the right lower quadrant with no rebound tenderness or guarding.  Per ED notes, missed dialysis.  Last treatment was Thursday.  Initial labs revealed normal lipase, glucose 150, BUN 79, creatinine 9.45, alk phos 214, total bilirubin 1.4, anion gap 19 otherwise unremarkable CMP, wbc 14.8 otherwise unremarkable CBC. CTA abdomen and pelvis was obtained and showed inflammation of the ascending colon and cecum compatible with acute colitis.  Patient was started on IV  Zosyn stool sample sent for C. difficile and GI panel.  Patient remained hypotensive despite 2 L of fluid boluses therefore PCCM was consulted for further management.  Hospital Course:  1 acute colitis/C. difficile colitis/norovirus gastroenteritis -Patient with history of recurrent C. difficile colitis with last C. difficile positivity on 02/15/2017 and 03/27/2017. -Patient presented to the ED with nausea vomiting multiple episodes of diarrhea compounded by hypotension.  Patient noted to have a leukocytosis. -Patient hydrated with IV fluids with improvement with blood pressure. -GI pathogen panel positive for norovirus.   -C. difficile PCR positive for C. difficile antigen, C. difficile toxin negative, toxigenic C. difficile PCR positive. -Procalcitonin elevated. -CT abdomen and pelvis done with inflamed 10 cm segment of the ascending colon and cecum compatible with acute colitis. -Due to patient's history of recurrent C. difficile colitis, abnormal findings on CT abdomen and pelvis, presentation with nausea vomiting diarrhea with associated hypotension, patient subsequently started on Dificid which he tolerated.  -Patient improved clinically throughout the hospitalization and will be discharged in stable and improved condition. -Insurance authorization for Dificid was attempted per pharmacy however declined per patient's insurance.  -Patient subsequently transition to oral vancomycin and be discharged on 10 days of oral vancomycin to complete a course of treatment.  -Outpatient follow-up with PCP.    2.  End-stage renal disease on hemodialysis TTS -Last hemodialysis was Thursday prior to admission. -Patient with no indication for emergent dialysis. -Nephrology consulted and patient seen in consultation by nephrology.   -Patient subsequently underwent hemodialysis on day of discharge on 04/16/2021.  -Patient will be discharged home to follow-up in his regular  hemodialysis center on 04/17/2021.  3.   Hypertension -Patient presented with hypotension with borderline blood pressure. -Blood pressure medications were held throughout the hospitalization  -Blood pressure improved.   -Patient did not require pressors  -Patient's antihypertensive medications will be resumed on discharge.   -Outpatient follow-up with PCP.    4.  Dehydration -Patient hydrated with IV fluids. -Patient was euvolemic by day of discharge.  5.  Anemia of chronic kidney disease -Hemoglobin remained stable at 11.4.   -Patient noted to be receiving Epogen with HD. -Nephrology consulted and followed the patient throughout the hospitalization.  -Outpatient follow-up.    6.  Hypomagnesemia -Likely secondary to GI losses.   -Repleted.   7.  Diabetes mellitus type 2 Hemoglobin A1c 5.6.   -Patient's oral hypoglycemic agents were held during the hospitalization.  - -Patient maintained on sliding scale insulin  -Outpatient follow-up with PCP.   Procedures:  CT abdomen and pelvis 04/14/2021  Consultations:  Nephrology  PCCM admission  Discharge Exam: Vitals:   04/16/21 1345 04/16/21 1557  BP: (!) 143/51 (!) 160/59  Pulse: (!) 55 (!) 56  Resp: 14 18  Temp:  98 F (36.7 C)  SpO2:  99%    General: NAD Cardiovascular: RRR Respiratory: CTAB  Discharge Instructions   Discharge Instructions    Diet - low sodium heart healthy   Complete by: As directed    Increase activity slowly   Complete by: As directed      Allergies as of 04/16/2021   No Known Allergies     Medication List    STOP taking these medications   glimepiride 2 MG tablet Commonly known as: AMARYL   pantoprazole 40 MG tablet Commonly known as: PROTONIX   REFRESH OP     TAKE these medications   acetaminophen 325 MG tablet Commonly known as: TYLENOL Take 2 tablets (650 mg total) by mouth every 6 (six) hours as needed for mild pain (or Fever >/= 101).   allopurinol 100 MG tablet Commonly known as: ZYLOPRIM Take 100  mg by mouth daily.   atorvastatin 20 MG tablet Commonly known as: LIPITOR Take 20 mg by mouth daily.   famotidine 20 MG tablet Commonly known as: PEPCID Take 1 tablet (20 mg total) by mouth daily.   ferrous sulfate 325 (65 FE) MG tablet Take 325 mg by mouth 2 (two) times daily with a meal.   hydrALAZINE 50 MG tablet Commonly known as: APRESOLINE Take 50 mg by mouth 3 (three) times daily.   lactulose 10 GM/15ML solution Commonly known as: CHRONULAC Take 20 g (30 mL) 3-4 times a day for a goal of at least 2 bowel movements a day. Start taking on: Apr 28, 2021 What changed: These instructions start on Apr 28, 2021. If you are unsure what to do until then, ask your doctor or other care provider.   lidocaine-prilocaine cream Commonly known as: EMLA Apply 1 application topically daily as needed.   metoprolol succinate 25 MG 24 hr tablet Commonly known as: TOPROL-XL Take 25 mg by mouth daily.   multivitamin Tabs tablet Take 1 tablet by mouth at bedtime.   sodium bicarbonate 650 MG tablet Take 1 tablet (650 mg total) by mouth daily. Start taking on: Apr 17, 2021 What changed: when to take this   tamsulosin 0.4 MG Caps capsule Commonly known as: FLOMAX Take 1 capsule (0.4 mg total) by mouth daily after supper.   vancomycin 50 mg/mL  oral solution Commonly known as: VANCOCIN Take  2.5 mLs (125 mg total) by mouth 4 (four) times daily for 10 days.   Vitamin D (Cholecalciferol) 25 MCG (1000 UT) Tabs Take 1 tablet by mouth daily.      No Known Allergies  Follow-up Information    [name], MD. Schedule an appointment as soon as possible for a visit in 2 week(s).   Specialty: Family Medicine Contact information: [place] [phone]        [facility] Follow up on 04/17/2021.                The results of significant diagnostics from this hospitalization (including imaging, microbiology, ancillary and laboratory)  are listed below for reference.    Significant Diagnostic Studies: CT ABDOMEN PELVIS W CONTRAST  Result Date: 04/14/2021 CLINICAL DATA:  80 year old male with abdominal pain and headache. Nausea and diarrhea. EXAM: CT ABDOMEN AND PELVIS WITH CONTRAST TECHNIQUE: Multidetector CT imaging of the abdomen and pelvis was performed using the standard protocol following bolus administration of intravenous contrast. CONTRAST:  134m OMNIPAQUE IOHEXOL 300 MG/ML  SOLN COMPARISON:  Prior noncontrast CT Abdomen and Pelvis 10/10/2018 and earlier. FINDINGS: Lower chest: Calcified coronary artery atherosclerosis. Borderline to mild cardiomegaly. No pericardial effusion. Lung base scarring and/or atelectasis is stable since 2019. Stable mild elevation of the left hemidiaphragm. No pleural effusion. Hepatobiliary: Nodular, cirrhotic liver. Gallbladder distension similar to 2019. No definite pericholecystic inflammation. No bile duct enlargement. No discrete liver lesion. Small caliber gastrohepatic ligament varices. Pancreas: Negative. Spleen: Nonenlarged, negative. There are prominent gastrosplenic ligament varices. Associated left spleno renal varices. Adrenals/Urinary Tract: Negative adrenal glands. Native renal atrophy. Renal vascular calcifications and occasional small cysts. No hydronephrosis or hydroureter. Diminutive urinary bladder. Stomach/Bowel: Decompressed rectosigmoid colon. Mild sigmoid diverticulosis. Decompressed descending colon. Gas in the transverse colon which appears negative. There is fluid in the right colon which is indistinct with circumferential wall thickening and the appendix is decompressed and appears to remain normal. Terminal ileum not definitely involved. Overall a roughly 10 cm segment of the right colon from the cecum to the ascending appears abnormal. No dilated small bowel. Decompressed stomach and duodenum. No free air. No free fluid. Mild regional mesenteric stranding (series 5, image 59).  Vascular/Lymphatic: Calcified aortic atherosclerosis. Major arterial structures in the abdomen and pelvis are patent. There is calcified bilateral femoral artery atherosclerosis. Portal venous system is patent. Reproductive: 16 mm rim enhancing low to intermediate density fluid collection off center in the prostate on series 2, image 83. Prostate size appears stable since 2019, with no inflammation surrounding the prostate. This is not obviously in communication with the prosthetic urethra, which is diminutive and difficult to identified. Negative visible inguinal canals. Other: No pelvic free fluid. Musculoskeletal: Prior plasty with bone cement of the bilateral iliac wings, stable. No acute osseous abnormality identified. IMPRESSION: 1. Inflamed 10 cm segment of the ascending colon and cecum compatible with acute colitis. Terminal ileum and appendix appear uninvolved. No perforation or complicating features identified. 2. 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 3. Small 1.6 cm rim enhancing fluid collection in the central prostate to the right of midline. No evidence of active prostatitis. 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 Electronically Signed   By: HGenevie AnnM.D.   On: 04/14/2021 05:13    Microbiology: Recent Results (from the past 240 hour(s))  C Difficile Quick Screen w PCR reflex     Status:  Abnormal   Collection Time: 04/14/21  5:45 AM   Specimen: STOOL  Result Value Ref Range Status   C Diff antigen POSITIVE (A) NEGATIVE Final   C Diff toxin NEGATIVE NEGATIVE Final   C Diff interpretation Results are indeterminate. See PCR results.  Final    Comment: Performed at [facility], [address] [place], [place] [address]  C. Diff by PCR, Reflexed     Status: Abnormal   Collection Time: 04/14/21  5:45 AM  Result Value Ref Range Status   Toxigenic C. Difficile by PCR POSITIVE (A) NEGATIVE Final     Comment: Positive for toxigenic C. difficile with little to no toxin production. Only treat if clinical presentation suggests symptomatic illness. Performed at [facility], [place], [place] [address]   Resp Panel by RT-PCR (Flu A&B, Covid) Nasopharyngeal Swab     Status: None   Collection Time: 04/14/21  6:51 AM   Specimen: Nasopharyngeal Swab; Nasopharyngeal(NP) swabs in vial transport medium  Result Value Ref Range Status   SARS Coronavirus 2 by RT PCR NEGATIVE NEGATIVE Final    Comment: (NOTE) SARS-CoV-2 target nucleic acids are NOT DETECTED.  The SARS-CoV-2 RNA is generally detectable in upper respiratory specimens during the acute phase of infection. The lowest concentration of SARS-CoV-2 viral copies this assay can detect is 138 copies/mL. A negative result does not preclude SARS-Cov-2 infection and should not be used as the sole basis for treatment or other patient management decisions. A negative result may occur with  improper specimen collection/handling, submission of specimen other than nasopharyngeal swab, presence of viral mutation(s) within the areas targeted by this assay, and inadequate number of viral copies(<138 copies/mL). A negative result must be combined with clinical observations, patient history, and epidemiological information. The expected result is Negative.  Fact Sheet for Patients:  EntrepreneurPulse.com.au  Fact Sheet for Healthcare Providers:  IncredibleEmployment.be  This test is no t yet approved or cleared by the [place] FDA and  has been authorized for detection and/or diagnosis of SARS-CoV-2 by FDA under an Emergency Use Authorization (EUA). This EUA will remain  in effect (meaning this test can be used) for the duration of the COVID-19 declaration under Section 564(b)(1) of the Act, 21 U.S.C.section 360bbb-3(b)(1), unless the authorization is terminated  or revoked sooner.        Influenza A by PCR NEGATIVE NEGATIVE Final   Influenza B by PCR NEGATIVE NEGATIVE Final    Comment: (NOTE) The Xpert Xpress SARS-CoV-2/FLU/RSV plus assay is intended as an aid in the diagnosis of influenza from Nasopharyngeal swab specimens and should not be used as a sole basis for treatment. Nasal washings and aspirates are unacceptable for Xpert Xpress SARS-CoV-2/FLU/RSV testing.  Fact Sheet for Patients: EntrepreneurPulse.com.au  Fact Sheet for Healthcare Providers: IncredibleEmployment.be  This test is not yet approved or cleared by the [place] FDA and has been authorized for detection and/or diagnosis of SARS-CoV-2 by FDA under an Emergency Use Authorization (EUA). This EUA will remain in effect (meaning this test can be used) for the duration of the COVID-19 declaration under Section 564(b)(1) of the Act, 21 U.S.C. section 360bbb-3(b)(1), unless the authorization is terminated or revoked.  Performed at [facility], [address] [place], [place] [address]   Culture, blood (routine x 2)     Status: None (Preliminary result)   Collection Time: 04/14/21  8:56 AM   Specimen: BLOOD  Result Value Ref Range Status   Specimen Description  BLOOD BLOOD RIGHT HAND  Final   Special Requests   Final    BOTTLES DRAWN AEROBIC AND ANAEROBIC Blood Culture adequate volume   Culture   Final    NO GROWTH 2 DAYS Performed at [facility], [address], [place], [place] [address]    Report Status PENDING  Incomplete  Culture, blood (routine x 2)     Status: None (Preliminary result)   Collection Time: 04/14/21  8:56 AM   Specimen: BLOOD  Result Value Ref Range Status   Specimen Description BLOOD RIGHT ARM  Final   Special Requests   Final    BOTTLES DRAWN AEROBIC AND ANAEROBIC Blood Culture adequate volume   Culture   Final    NO GROWTH 2 DAYS Performed at [facility], [address],  [place], [place] [address]    Report Status PENDING  Incomplete  MRSA PCR Screening     Status: None   Collection Time: 04/14/21  5:05 PM   Specimen: Nasal Mucosa; Nasopharyngeal  Result Value Ref Range Status   MRSA by PCR NEGATIVE NEGATIVE Final    Comment:        The GeneXpert MRSA Assay (FDA approved for NASAL specimens only), is one component of a comprehensive MRSA colonization surveillance program. It is not intended to diagnose MRSA infection nor to guide or monitor treatment for MRSA infections. Performed at [facility], [address] [place], [place] [address]   Gastrointestinal Panel by PCR , Stool     Status: Abnormal   Collection Time: 04/15/21  5:45 AM   Specimen: STOOL  Result Value Ref Range Status   Campylobacter species NOT DETECTED NOT DETECTED Final   Plesimonas shigelloides NOT DETECTED NOT DETECTED Final   Salmonella species NOT DETECTED NOT DETECTED Final   Yersinia enterocolitica NOT DETECTED NOT DETECTED Final   Vibrio species NOT DETECTED NOT DETECTED Final   Vibrio cholerae NOT DETECTED NOT DETECTED Final   Enteroaggregative E coli (EAEC) NOT DETECTED NOT DETECTED Final   Enteropathogenic E coli (EPEC) NOT DETECTED NOT DETECTED Final   Enterotoxigenic E coli (ETEC) NOT DETECTED NOT DETECTED Final   Shiga like toxin producing E coli (STEC) NOT DETECTED NOT DETECTED Final   Shigella/Enteroinvasive E coli (EIEC) NOT DETECTED NOT DETECTED Final   Cryptosporidium NOT DETECTED NOT DETECTED Final   Cyclospora cayetanensis NOT DETECTED NOT DETECTED Final   Entamoeba histolytica NOT DETECTED NOT DETECTED Final   Giardia lamblia NOT DETECTED NOT DETECTED Final   Adenovirus F40/41 NOT DETECTED NOT DETECTED Final   Astrovirus NOT DETECTED NOT DETECTED Final   Norovirus GI/GII DETECTED (A) NOT DETECTED Final    Comment: RESULT CALLED TO, READ BACK BY AND VERIFIED WITH: PATRICIA COPPEDGE 04/15/21 0750 SJL    Rotavirus A NOT DETECTED NOT DETECTED Final    Sapovirus (I, II, IV, and V) NOT DETECTED NOT DETECTED Final    Comment: Performed at [facility], [address] [place], [place] [address]     Labs: Basic Metabolic Panel: Recent Labs  Lab 04/13/21 2123 04/14/21 2023 04/15/21 1143 04/16/21 0408  NA 135  --  131* 131*  K 4.1  --  4.7 5.3*  CL 100  --  97* 98  CO2 16*  --  17* 17*  GLUCOSE 150*  --  164* 92  BUN 79*  --  98* 111*  CREATININE 9.45* 10.84* 12.24* 13.50*  CALCIUM 9.3  --  8.6* 8.7*  MG  --   --  1.6* 2.0  PHOS  --   --  4.9* 5.0*   Liver Function Tests: Recent Labs  Lab 04/13/21 2123 04/16/21 0408  AST 23  --   ALT 24  --   ALKPHOS 214*  --   BILITOT 1.4*  --   PROT 7.7  --   ALBUMIN 3.5 2.6*   Recent Labs  Lab 04/13/21 2123  LIPASE 30   No results for input(s): AMMONIA in the last 168 hours. CBC: Recent Labs  Lab 04/13/21 2123 04/14/21 2023 04/15/21 1143 04/16/21 0408  WBC 14.8* 14.9* 14.1* 8.3  NEUTROABS  --   --   --  5.5  HGB 13.4 12.2* 11.5* 11.4*  HCT 39.9 36.5* 34.5* 33.2*  MCV 96.4 97.3 96.6 93.8  PLT 221 172 171 183   Cardiac Enzymes: No results for input(s): CKTOTAL, CKMB, CKMBINDEX, TROPONINI in the last 168 hours. BNP: BNP (last 3 results) No results for input(s): BNP in the last 8760 hours.  ProBNP (last 3 results) No results for input(s): PROBNP in the last 8760 hours.  CBG: Recent Labs  Lab 04/15/21 2117 04/16/21 0805 04/16/21 1555  GLUCAP 159* 94 177*       Signed:  Irine Seal MD.  [facility] 04/16/2021, 6:25 PM

## 2021-04-16 NOTE — Progress Notes (Signed)
OT Cancellation Note  Patient Details Name: Martin Gilbert MRN: [id number] DOB: [birth year]   Cancelled Treatment:    Reason Eval/Treat Not Completed: Medical issues which prohibited therapy. Consult received, chart reviewed. K+ this am 5.3. Additionally, last HD on Thursday. Per Nephrology, plan for HD today. Will hold OT evaluation at this time and re-attempt at later date/time as medically appropriate for exertional activity.  Hanley Hays, MPH, MS, OTR/L ascom [phone] 04/16/21, 8:19 AM

## 2021-04-16 NOTE — Progress Notes (Signed)
PT Cancellation Note  Patient Details Name: Orange Gelin MRN: [id number] DOB: [birth year]   Cancelled Treatment:    Reason Eval/Treat Not Completed: Other (comment);Medical issues which prohibited therapy;Patient at procedure or test/unavailable. Patient off the floor for dialysis. Also noted potassium of 5.3. PT will follow up as patient appropriate for exertional activity.   Minna Merritts, PT, MPT  Percell Locus 04/16/2021, 1:17 PM

## 2021-04-16 NOTE — TOC Initial Note (Signed)
Transition of Care [facility]) - Initial/Assessment Note    Patient Details  Name: Martin Gilbert MRN: [id number] Date of Birth: 07-26-1941  Transition of Care [facility]) CM/SW Contact:    Beverly Sessions, RN Phone Number: 04/16/2021, 12:29 PM  Clinical Narrative:                 Patient admitted from home with acute colitis  Patient currently in HD.  Completed assessment with daughter.  Patient lives at home with wife.  Son stays in the home nightly with patient and his spouse Other children check on them during the day Patient uses [facility] to HD PCP Rebeca Alert at [facility] drew - denies issues obtaining medication  PT and OT have recommend home health. Daughter in agreement and does not have preference in agency.  Referral made and accepted by [facility] with [place].  Corene Cornea notified that patient speaks specific Spanish dialect.  Daughter speaks English  [name] dialysis liaison notified of admission.     Expected Discharge Plan: [place] Barriers to Discharge: Continued Medical Work up   Patient Goals and CMS Choice   CMS Medicare.gov Compare Post Acute Care list provided to:: Patient Represenative (must comment) Choice offered to / list presented to : Adult Children  Expected Discharge Plan and Services Expected Discharge Plan: [place]   Discharge Planning Services: CM Consult Post Acute Care Choice: [place] arrangements for the past 2 months: [place]: PT,OT [place] Agency: [place] ([facility]) Date HH Agency Contacted: 04/16/21   Representative spoke with at [place]: Corene Cornea  Prior Living Arrangements/Services Living arrangements for the past 2 months: Single Family Home Lives with:: Spouse Patient language and need for interpreter reviewed:: Yes Do you feel safe going back to the place where you live?: Yes      Need for  Family Participation in Patient Care: Yes (Comment) Care giver support system in place?: Yes (comment) Current home services: DME Criminal Activity/Legal Involvement Pertinent to Current Situation/Hospitalization: No - Comment as needed  Activities of Daily Living Home Assistive Devices/Equipment: Gilford Rile (specify type) ADL Screening (condition at time of admission) Patient's cognitive ability adequate to safely complete daily activities?: Yes Is the patient deaf or have difficulty hearing?: No Does the patient have difficulty seeing, even when wearing glasses/contacts?: No Does the patient have difficulty concentrating, remembering, or making decisions?: No Patient able to express need for assistance with ADLs?: Yes Does the patient have difficulty dressing or bathing?: No Independently performs ADLs?: Yes (appropriate for developmental age) Does the patient have difficulty walking or climbing stairs?: No Weakness of Legs: Both Weakness of Arms/Hands: None  Permission Sought/Granted                  Emotional Assessment       Orientation: : Oriented to Self,Oriented to Place,Oriented to  Time,Oriented to Situation Alcohol / Substance Use: Not Applicable Psych Involvement: No (comment)  Admission diagnosis:  Colitis [K52.9] Acute colitis [K52.9] Hypotension, unspecified hypotension type [I95.9] Patient Active Problem List   Diagnosis Date Noted  . Norovirus 04/15/2021  . Hypotension   . Dehydration   . Hypomagnesemia   . Acute colitis 04/14/2021  . Acute metabolic encephalopathy 123456  . Acute gastroenteritis 10/11/2018  . Intractable nausea  and vomiting 10/10/2018  . Sacral fracture ([place]) 09/29/2018  . Complication of arteriovenous dialysis fistula 09/01/2017  . ESRD (end stage renal disease) ([place]) 06/03/2017  . Colitis 03/27/2017  . Demand ischemia ([place]) 03/27/2017  . Enteritis due to Clostridium difficile   . Protein-calorie malnutrition, severe 02/18/2017   . C. difficile colitis 02/15/2017  . UTI (urinary tract infection) 02/02/2017  . E coli infection 02/02/2017  . Lactic acidosis 02/02/2017  . Diabetic neuropathy ([place]) 02/02/2017  . Left-sided chest wall pain 02/02/2017  . Fall 02/02/2017  . Generalized weakness 02/02/2017  . Pink eye, left 02/02/2017  . Leukocytosis 02/02/2017  . Anemia of chronic disease 02/02/2017  . Nausea and vomiting 02/02/2017  . Sepsis ([place]) 01/31/2017  . Foot pain 10/08/2016  . Osteoarthritis of knee 10/08/2016  . Hypertension 11/28/2015  . Chronic gout due to renal impairment, right ankle and foot, without tophus (tophi) 07/31/2015  . Bone lesion 06/05/2015  . Primary osteoarthritis of both knees 06/05/2015  . Pain in the chest 02/15/2015  . Neck pain 02/15/2015  . Diabetes type 2, uncontrolled ([place]) 02/15/2015  . Frequent headaches 02/15/2015  . Type 2 diabetes mellitus with hyperglycemia ([place]) 02/15/2015  . Hypertrophy of prostate with urinary obstruction and other lower urinary tract symptoms (LUTS) 09/07/2013   PCP:  [name], MD Pharmacy:   [facility], [address] [place] [address] Phone: [phone] Fax: [phone]     Social Determinants of Health (SDOH) Interventions    Readmission Risk Interventions Readmission Risk Prevention Plan 04/16/2021  Transportation Screening Complete  Home Care Screening Complete  Medication Review (RN CM) Complete  Some recent data might be hidden

## 2021-04-16 NOTE — Progress Notes (Signed)
Martin Gilbert  A and O x 4 VSS. Pt tolerating diet well. No complaints of pain or nausea. IV removed intact, prescriptions given. Pt voices understanding of discharge instructions with no further questions. Pt discharged 04/16/21 via wheelchair with daughter. Per day nurse, tomorrow RN will follow up with social worker about [facility] services. Patient is aware of this.    Allergies as of 04/16/2021   No Known Allergies     Medication List    STOP taking these medications   glimepiride 2 MG tablet Commonly known as: AMARYL   pantoprazole 40 MG tablet Commonly known as: PROTONIX   REFRESH OP     TAKE these medications   acetaminophen 325 MG tablet Commonly known as: TYLENOL Take 2 tablets (650 mg total) by mouth every 6 (six) hours as needed for mild pain (or Fever >/= 101).   allopurinol 100 MG tablet Commonly known as: ZYLOPRIM Take 100 mg by mouth daily.   atorvastatin 20 MG tablet Commonly known as: LIPITOR Take 20 mg by mouth daily.   famotidine 20 MG tablet Commonly known as: PEPCID Take 1 tablet (20 mg total) by mouth daily.   ferrous sulfate 325 (65 FE) MG tablet Take 325 mg by mouth 2 (two) times daily with a meal.   hydrALAZINE 50 MG tablet Commonly known as: APRESOLINE Take 50 mg by mouth 3 (three) times daily.   lactulose 10 GM/15ML solution Commonly known as: CHRONULAC Take 20 g (30 mL) 3-4 times a day for a goal of at least 2 bowel movements a day. Start taking on: Apr 28, 2021 What changed: These instructions start on Apr 28, 2021. If you are unsure what to do until then, ask your doctor or other care provider.   lidocaine-prilocaine cream Commonly known as: EMLA Apply 1 application topically daily as needed.   metoprolol succinate 25 MG 24 hr tablet Commonly known as: TOPROL-XL Take 25 mg by mouth daily.   multivitamin Tabs tablet Take 1 tablet by mouth at bedtime.   sodium bicarbonate 650 MG tablet Take 1 tablet (650 mg total) by mouth  daily. Start taking on: Apr 17, 2021 What changed: when to take this   tamsulosin 0.4 MG Caps capsule Commonly known as: FLOMAX Take 1 capsule (0.4 mg total) by mouth daily after supper.   vancomycin 50 mg/mL  oral solution Commonly known as: VANCOCIN Take 2.5 mLs (125 mg total) by mouth 4 (four) times daily for 10 days.   Vitamin D (Cholecalciferol) 25 MCG (1000 UT) Tabs Take 1 tablet by mouth daily.       Vitals:   04/16/21 1345 04/16/21 1557  BP: (!) 143/51 (!) 160/59  Pulse: (!) 55 (!) 56  Resp: 14 18  Temp:  98 F (36.7 C)  SpO2:  99%    [name]

## 2021-04-19 LAB — CULTURE, BLOOD (ROUTINE X 2)
Culture: NO GROWTH
Culture: NO GROWTH
Special Requests: ADEQUATE
Special Requests: ADEQUATE

## 2021-06-15 ENCOUNTER — Emergency Department
Admission: EM | Admit: 2021-06-15 | Discharge: 2021-06-15 | Disposition: A | Discharge status: Home / Self Care | Payer: Medicare Other | Attending: Emergency Medicine | Admitting: Emergency Medicine

## 2021-06-15 ENCOUNTER — Other Ambulatory Visit: Payer: Self-pay

## 2021-06-15 ENCOUNTER — Emergency Department: Payer: Medicare Other

## 2021-06-15 DIAGNOSIS — K529 Noninfective gastroenteritis and colitis, unspecified: Secondary | ICD-10-CM | POA: Diagnosis not present

## 2021-06-15 DIAGNOSIS — J111 Influenza due to unidentified influenza virus with other respiratory manifestations: Secondary | ICD-10-CM | POA: Diagnosis not present

## 2021-06-15 DIAGNOSIS — E1122 Type 2 diabetes mellitus with diabetic chronic kidney disease: Secondary | ICD-10-CM | POA: Diagnosis not present

## 2021-06-15 DIAGNOSIS — Z79899 Other long term (current) drug therapy: Secondary | ICD-10-CM | POA: Insufficient documentation

## 2021-06-15 DIAGNOSIS — I120 Hypertensive chronic kidney disease with stage 5 chronic kidney disease or end stage renal disease: Secondary | ICD-10-CM | POA: Diagnosis not present

## 2021-06-15 DIAGNOSIS — J101 Influenza due to other identified influenza virus with other respiratory manifestations: Secondary | ICD-10-CM

## 2021-06-15 DIAGNOSIS — N186 End stage renal disease: Secondary | ICD-10-CM | POA: Insufficient documentation

## 2021-06-15 DIAGNOSIS — D631 Anemia in chronic kidney disease: Secondary | ICD-10-CM | POA: Insufficient documentation

## 2021-06-15 DIAGNOSIS — B349 Viral infection, unspecified: Secondary | ICD-10-CM | POA: Diagnosis not present

## 2021-06-15 DIAGNOSIS — Z20822 Contact with and (suspected) exposure to covid-19: Secondary | ICD-10-CM | POA: Insufficient documentation

## 2021-06-15 DIAGNOSIS — R509 Fever, unspecified: Secondary | ICD-10-CM

## 2021-06-15 DIAGNOSIS — Z992 Dependence on renal dialysis: Secondary | ICD-10-CM | POA: Diagnosis not present

## 2021-06-15 LAB — CBC WITH DIFFERENTIAL/PLATELET
Abs Immature Granulocytes: 0.02 10*3/uL (ref 0.00–0.07)
Basophils Absolute: 0 10*3/uL (ref 0.0–0.1)
Basophils Relative: 0 %
Eosinophils Absolute: 0.1 10*3/uL (ref 0.0–0.5)
Eosinophils Relative: 1 %
HCT: 25.5 % — ABNORMAL LOW (ref 39.0–52.0)
Hemoglobin: 8.6 g/dL — ABNORMAL LOW (ref 13.0–17.0)
Immature Granulocytes: 0 %
Lymphocytes Relative: 11 %
Lymphs Abs: 0.8 10*3/uL (ref 0.7–4.0)
MCH: 33.1 pg (ref 26.0–34.0)
MCHC: 33.7 g/dL (ref 30.0–36.0)
MCV: 98.1 fL (ref 80.0–100.0)
Monocytes Absolute: 0.5 10*3/uL (ref 0.1–1.0)
Monocytes Relative: 7 %
Neutro Abs: 5.6 10*3/uL (ref 1.7–7.7)
Neutrophils Relative %: 81 %
Platelets: 138 10*3/uL — ABNORMAL LOW (ref 150–400)
RBC: 2.6 MIL/uL — ABNORMAL LOW (ref 4.22–5.81)
RDW: 13.5 % (ref 11.5–15.5)
WBC: 7 10*3/uL (ref 4.0–10.5)
nRBC: 0 % (ref 0.0–0.2)

## 2021-06-15 LAB — COMPREHENSIVE METABOLIC PANEL
ALT: 28 U/L (ref 0–44)
AST: 49 U/L — ABNORMAL HIGH (ref 15–41)
Albumin: 2.9 g/dL — ABNORMAL LOW (ref 3.5–5.0)
Alkaline Phosphatase: 243 U/L — ABNORMAL HIGH (ref 38–126)
Anion gap: 14 (ref 5–15)
BUN: 68 mg/dL — ABNORMAL HIGH (ref 8–23)
CO2: 24 mmol/L (ref 22–32)
Calcium: 7.9 mg/dL — ABNORMAL LOW (ref 8.9–10.3)
Chloride: 102 mmol/L (ref 98–111)
Creatinine, Ser: 7.76 mg/dL — ABNORMAL HIGH (ref 0.61–1.24)
GFR, Estimated: 6 mL/min — ABNORMAL LOW (ref >60)
Glucose, Bld: 157 mg/dL — ABNORMAL HIGH (ref 70–99)
Potassium: 3.3 mmol/L — ABNORMAL LOW (ref 3.5–5.1)
Sodium: 140 mmol/L (ref 135–145)
Total Bilirubin: 1.3 mg/dL — ABNORMAL HIGH (ref 0.3–1.2)
Total Protein: 6.8 g/dL (ref 6.5–8.1)

## 2021-06-15 LAB — LIPASE, BLOOD: Lipase: 31 U/L (ref 11–51)

## 2021-06-15 LAB — RESP PANEL BY RT-PCR (FLU A&B, COVID) ARPGX2
Influenza A by PCR: POSITIVE — AB
Influenza B by PCR: NEGATIVE
SARS Coronavirus 2 by RT PCR: NEGATIVE

## 2021-06-15 LAB — LACTIC ACID, PLASMA: Lactic Acid, Venous: 1.8 mmol/L (ref 0.5–1.9)

## 2021-06-15 MED ORDER — ONDANSETRON HCL 4 MG/2ML IJ SOLN
4.0000 mg | Freq: Once | INTRAMUSCULAR | Status: AC
  Administered 2021-06-15: 4 mg via INTRAVENOUS
  Filled 2021-06-15: qty 2

## 2021-06-15 MED ORDER — ONDANSETRON 4 MG PO TBDP: 4.0000 mg | ORAL_TABLET | Freq: Three times a day (TID) | ORAL | 0 refills | Status: DC | PRN

## 2021-06-15 MED ORDER — OSELTAMIVIR PHOSPHATE 75 MG PO CAPS: 75.0000 mg | ORAL_CAPSULE | Freq: Two times a day (BID) | ORAL | 0 refills | Status: AC

## 2021-06-15 MED ORDER — ACETAMINOPHEN 325 MG PO TABS
650.0000 mg | ORAL_TABLET | Freq: Once | ORAL | Status: AC | PRN
  Administered 2021-06-15: 650 mg via ORAL
  Filled 2021-06-15: qty 2

## 2021-06-15 NOTE — ED Provider Notes (Signed)
Martin Gilbert [facility] Emergency Department Provider Note   ____________________________________________   Event Date/Time   First MD Initiated Contact with Patient 06/15/21 1714     (approximate)  I have reviewed the triage vital signs and the nursing notes.   HISTORY  Chief Complaint Fever    HPI Bowman Blazina is a 80 y.o. male with the below stated past medical history presents for fever, nausea, and vomiting  LOCATION: Generalized DURATION: 1 day TIMING: Worsening since onset SEVERITY: Severe QUALITY: Aching CONTEXT: Patient began feeling chills, nausea, and began vomiting yesterday with worsening of symptoms and p.o. intolerance MODIFYING FACTORS: Nausea and vomiting worsen with any p.o. intake ASSOCIATED SYMPTOMS: Abdominal pain   Per medical record review patient does have history of C. difficile colitis          Past Medical History:  Diagnosis Date   Anemia    Arthritis    Back pain    BPH (benign prostatic hyperplasia)    CKD (chronic kidney disease), stage IV ([name])    Clostridium difficile colitis    Diabetes mellitus without complication ([place])    GERD (gastroesophageal reflux disease)    HTN (hypertension)    Hyperlipidemia     Patient Active Problem List   Diagnosis Date Noted   Norovirus 04/15/2021   Hypotension    Dehydration    Hypomagnesemia    Acute colitis [id number]   Acute metabolic encephalopathy [id number]   Acute gastroenteritis 10/11/2018   Intractable nausea and vomiting 10/10/2018   Sacral fracture ([place]) [id number]   Complication of arteriovenous dialysis fistula 09/01/2017   ESRD (end stage renal disease) ([place]) 06/03/2017   Colitis 03/27/2017   Demand ischemia ([place]) 03/27/2017   Enteritis due to Clostridium difficile    Protein-calorie malnutrition, severe 02/18/2017   C. difficile colitis 02/15/2017   UTI (urinary tract infection) 02/02/2017   E coli infection 02/02/2017   Lactic acidosis  02/02/2017   Diabetic neuropathy ([place]) 02/02/2017   Left-sided chest wall pain 02/02/2017   Fall 02/02/2017   Generalized weakness 02/02/2017   Pink eye, left 02/02/2017   Leukocytosis 02/02/2017   Anemia of chronic disease 02/02/2017   Nausea and vomiting 02/02/2017   Sepsis ([place]) 01/31/2017   Foot pain 10/08/2016   Osteoarthritis of knee 10/08/2016   Hypertension 11/28/2015   Chronic gout due to renal impairment, right ankle and foot, without tophus (tophi) 07/31/2015   Bone lesion 06/05/2015   Primary osteoarthritis of both knees 06/05/2015   Pain in the chest 02/15/2015   Neck pain 02/15/2015   Diabetes type 2, uncontrolled ([place]) 02/15/2015   Frequent headaches 02/15/2015   Type 2 diabetes mellitus with hyperglycemia ([place]) 02/15/2015   Hypertrophy of prostate with urinary obstruction and other lower urinary tract symptoms (LUTS) 09/07/2013    Past Surgical History:  Procedure Laterality Date   A/V FISTULAGRAM Left 04/20/2018   Procedure: A/V FISTULAGRAM;  Surgeon: Algernon Huxley, MD;  Location: [place] CV LAB;  Service: Cardiovascular;  Laterality: Left;   A/V FISTULAGRAM Left 11/25/2018   Procedure: A/V FISTULAGRAM;  Surgeon: Algernon Huxley, MD;  Location: [place] CV LAB;  Service: Cardiovascular;  Laterality: Left;   A/V FISTULAGRAM Left 01/11/2019   Procedure: A/V FISTULAGRAM;  Surgeon: Algernon Huxley, MD;  Location: [place] CV LAB;  Service: Cardiovascular;  Laterality: Left;   AV FISTULA PLACEMENT Left 06/13/2017   Procedure: ARTERIOVENOUS (AV) FISTULA CREATION ( RADIAL CEPHALIC );  Surgeon: Katha Cabal, MD;  [facility]  ORS;  Service: Vascular;  Laterality: Left;   CATARACT EXTRACTION     one eye not sure which   DIALYSIS/PERMA CATHETER INSERTION N/A 04/03/2017   Procedure: Dialysis/Perma Catheter Insertion;  Surgeon: Algernon Huxley, MD;  Location: [place] CV LAB;  Service: Cardiovascular;  Laterality: N/A;   DIALYSIS/PERMA CATHETER INSERTION N/A  08/06/2017   Procedure: DIALYSIS/PERMA CATHETER INSERTION;  Surgeon: Algernon Huxley, MD;  Location: [place] CV LAB;  Service: Cardiovascular;  Laterality: N/A;   DIALYSIS/PERMA CATHETER REMOVAL N/A 08/04/2017   Procedure: DIALYSIS/PERMA CATHETER REMOVAL;  Surgeon: Algernon Huxley, MD;  Location: [place] CV LAB;  Service: Cardiovascular;  Laterality: N/A;   DIALYSIS/PERMA CATHETER REMOVAL N/A 12/31/2017   Procedure: DIALYSIS/PERMA CATHETER REMOVAL;  Surgeon: Algernon Huxley, MD;  Location: [place] CV LAB;  Service: Cardiovascular;  Laterality: N/A;   hernia repair     SACROPLASTY N/A 09/30/2018   Procedure: SACROPLASTY;  Surgeon: Hessie Knows, MD;  Location: [facility] ORS;  Service: Orthopedics;  Laterality: N/A;   UPPER EXTREMITY ANGIOGRAPHY Left 09/05/2017   Procedure: Upper Extremity Angiography;  Surgeon: Katha Cabal, MD;  Location: [place] CV LAB;  Service: Cardiovascular;  Laterality: Left;    Prior to Admission medications   Medication Sig Start Date End Date Taking? Authorizing Provider  ondansetron (ZOFRAN ODT) 4 MG disintegrating tablet Take 1 tablet (4 mg total) by mouth every 8 (eight) hours as needed for nausea or vomiting. 06/15/21  Yes Naaman Plummer, MD  oseltamivir (TAMIFLU) 75 MG capsule Take 1 capsule (75 mg total) by mouth 2 (two) times daily for 5 days. 06/15/21 06/20/21 Yes Naaman Plummer, MD  acetaminophen (TYLENOL) 325 MG tablet Take 2 tablets (650 mg total) by mouth every 6 (six) hours as needed for mild pain (or Fever >/= 101). 11/28/17   [name], MD  allopurinol (ZYLOPRIM) 100 MG tablet Take 100 mg by mouth daily. 10/17/15   [provider]  atorvastatin (LIPITOR) 20 MG tablet Take 20 mg by mouth daily.    [provider]  famotidine (PEPCID) 20 MG tablet Take 1 tablet (20 mg total) by mouth daily. 04/16/21 04/16/22  Eugenie Filler, MD  ferrous sulfate 325 (65 FE) MG tablet Take 325 mg by mouth 2 (two) times daily with a meal.  08/30/15    [provider]  hydrALAZINE (APRESOLINE) 50 MG tablet Take 50 mg by mouth 3 (three) times daily.    [provider]  lactulose (CHRONULAC) 10 GM/15ML solution Take 20 g (30 mL) 3-4 times a day for a goal of at least 2 bowel movements a day. 04/28/21   Eugenie Filler, MD  lidocaine-prilocaine (EMLA) cream Apply 1 application topically daily as needed. Patient not taking: Reported on 04/16/2021 10/16/17   [provider]  metoprolol succinate (TOPROL-XL) 25 MG 24 hr tablet Take 25 mg by mouth daily.    [provider]  multivitamin (RENA-VIT) TABS tablet Take 1 tablet by mouth at bedtime. Patient not taking: Reported on 04/16/2021 10/14/18   MayoPete Pelt, MD  sodium bicarbonate 650 MG tablet Take 1 tablet (650 mg total) by mouth daily. 04/17/21   Eugenie Filler, MD  tamsulosin (FLOMAX) 0.4 MG CAPS capsule Take 1 capsule (0.4 mg total) by mouth daily after supper. 02/19/17   Epifanio Lesches, MD  Vitamin Gilbert, Cholecalciferol, 1000 UNITS TABS Take 1 tablet by mouth daily.     [provider]    Allergies Patient has no  known allergies.  Family History  Problem Relation Age of Onset   Hypertension Mother    Diabetes Neg Hx     Social History Social History   Tobacco Use   Smoking status: Never   Smokeless tobacco: Never  Vaping Use   Vaping Use: Never used  Substance Use Topics   Alcohol use: No    Comment: Drank in the past, but has stopped for several years.    Drug use: No    Review of Systems Constitutional: Endorses fever/chills Eyes: No visual changes. ENT: No sore throat. Cardiovascular: Denies chest pain. Respiratory: Denies shortness of breath. Gastrointestinal: No abdominal pain.  Endorses nausea and vomiting.  No diarrhea. Genitourinary: Negative for dysuria. Musculoskeletal: Negative for acute arthralgias Skin: Negative for rash. Neurological: Negative for headaches, weakness/numbness/paresthesias in any  extremity Psychiatric: Negative for suicidal ideation/homicidal ideation   ____________________________________________   PHYSICAL EXAM:  VITAL SIGNS: ED Triage Vitals  Enc Vitals Group     BP 06/15/21 1534 (!) 145/67     Pulse Rate 06/15/21 1534 92     Resp 06/15/21 1534 20     Temp 06/15/21 1535 (!) 102.4 F (39.1 C)     Temp Source 06/15/21 1535 Oral     SpO2 06/15/21 1534 100 %     Weight --      Height 06/15/21 1531 '5\' 5"'$  (1.651 m)     Head Circumference --      Peak Flow --      Pain Score 06/15/21 1531 5     Pain Loc --      Pain Edu? --      Excl. in [place]? --    Constitutional: Alert and oriented. Well appearing elderly Hispanic male in no acute distress. Eyes: Conjunctivae are normal. PERRL. Head: Atraumatic. Nose: No congestion/rhinnorhea. Mouth/Throat: Mucous membranes are moist. Neck: No stridor Cardiovascular: Grossly normal heart sounds.  Good peripheral circulation. Respiratory: Normal respiratory effort.  No retractions. Gastrointestinal: Soft and nontender. No distention. Musculoskeletal: No obvious deformities Neurologic:  Normal speech and language. No gross focal neurologic deficits are appreciated. Skin:  Skin is warm and dry. No rash noted. Psychiatric: Mood and affect are normal. Speech and behavior are normal.  ____________________________________________   LABS (all labs ordered are listed, but only abnormal results are displayed)  Labs Reviewed  RESP PANEL BY RT-PCR (FLU A&B, COVID) ARPGX2 - Abnormal; Notable for the following components:      Result Value   Influenza A by PCR POSITIVE (*)    All other components within normal limits  COMPREHENSIVE METABOLIC PANEL - Abnormal; Notable for the following components:   Potassium 3.3 (*)    Glucose, Bld 157 (*)    BUN 68 (*)    Creatinine, Ser 7.76 (*)    Calcium 7.9 (*)    Albumin 2.9 (*)    AST 49 (*)    Alkaline Phosphatase 243 (*)    Total Bilirubin 1.3 (*)    GFR, Estimated 6 (*)     All other components within normal limits  CBC WITH DIFFERENTIAL/PLATELET - Abnormal; Notable for the following components:   RBC 2.60 (*)    Hemoglobin 8.6 (*)    HCT 25.5 (*)    Platelets 138 (*)    All other components within normal limits  LACTIC ACID, PLASMA  LIPASE, BLOOD  URINALYSIS, COMPLETE (UACMP) WITH MICROSCOPIC   ____________________________________________  EKG  ED ECG REPORT I, Naaman Plummer, the attending physician, personally viewed and  interpreted this ECG.  Date: 06/15/2021 EKG Time: 1547 Rate: 88 Rhythm: normal sinus rhythm QRS Axis: normal Intervals: Bifascicular block ST/T Wave abnormalities: normal Narrative Interpretation: Bifascicular block.  No evidence of acute ischemia  ____________________________________________  RADIOLOGY  ED MD interpretation: CT of the abdomen and pelvis shows enteritis as well as findings of cirrhosis and in large prostate  Official radiology report(s): CT Abdomen Pelvis Wo Contrast  Result Date: 06/15/2021 CLINICAL DATA:  Two days of emesis, stomach pain, constant nausea, no fever EXAM: CT ABDOMEN AND PELVIS WITHOUT CONTRAST TECHNIQUE: Multidetector CT imaging of the abdomen and pelvis was performed following the standard protocol without IV contrast. COMPARISON:  CT 04/14/2021 FINDINGS: Lower chest: Atelectatic changes are present in the lung bases, left greater than right. Cardiomegaly. Coronary artery atherosclerosis. Hypoattenuation of the cardiac blood pool compatible with anemia. No pericardial effusion. Hepatobiliary: Nodular hepatic surface contour. No concerning visible focal liver lesion. Moderate gallbladder distension albeit without significant gallbladder wall thickening or focal pericholecystic inflammation. No visible calcified gallstones or biliary ductal dilatation. Pancreas: No pancreatic ductal dilatation or surrounding inflammatory changes. Spleen: Normal in size. No concerning splenic lesions.  Adrenals/Urinary Tract: No concerning adrenal masses. Bilateral renal atrophy and chronic bilateral perinephric stranding. Multiple renal cysts with additional subcentimeter hypoattenuating foci too small to fully characterize on CT imaging but statistically likely benign. No concerning renal mass. No urolithiasis or hydronephrosis. Circumferential thickening urinary bladder with indentation of bladder base by an enlarged prostate. Stomach/Bowel: Vascular collateralization along the stomach. Distal esophagus and stomach are otherwise unremarkable. Unremarkable appearance of the duodenal sweep. Diffusely fluid-filled appearance of the small bowel with some questionable mural thickening. Additional focal thickening towards the cecal tip with more mild distal colonic edematous thickening. No focal periappendiceal inflammation with adjacent stranding favored to be distributed from the cecum. More distal colonic segments are unremarkable accounting for degree of distension. Questionable rectal thickening with some perirectal hazy stranding as well. Vascular/Lymphatic: Atherosclerotic calcifications within the abdominal aorta and branch vessels. No aneurysm or ectasia. No enlarged abdominopelvic lymph nodes. Reproductive: Prostatomegaly, as above.  No concerning focal lesion. Other: Some mild hazy mesenteric changes. No free air or fluid. Mild posterior body wall edema. Musculoskeletal: No acute osseous abnormality or suspicious osseous lesion. Multilevel degenerative changes are present in the imaged portions of the spine. IMPRESSION: 1. 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 2. Upper abdominal venous collateralization, similar to prior. 3. Nonspecific fluid-filled appearance of the small bowel with some mild mural thickening. More focal mural thickening seen towards the level of the cecum and rectum as well with adjacent stranding. Findings can reflect sequela of portal enteropathy though should  correlate for features of an acute enterocolitis of infectious or inflammatory etiology. 4. Mild gallbladder distention without evidence of frank gallbladder wall thickening or pericholecystic inflammation. Correlate for right upper quadrant symptoms. 5. Prostatomegaly with indentation of bladder base. Mild bladder wall thickening may be related to underdistention though should assess with urinalysis to exclude superimposed cystitis. Electronically Signed   By: Lovena Le M.Gilbert.   On: 06/15/2021 18:38    ____________________________________________   PROCEDURES  Procedure(s) performed (including Critical Care):  Procedures   ____________________________________________   INITIAL IMPRESSION / ASSESSMENT AND PLAN / ED COURSE  As part of my medical decision making, I reviewed the following data within the electronic medical record, if available:  Nursing notes reviewed and incorporated, Labs reviewed, EKG interpreted, Old chart reviewed, Radiograph reviewed and Notes from prior ED visits reviewed and  incorporated        Otherwise healthy patient presenting with constellation of symptoms likely representing uncomplicated viral upper respiratory symptoms as characterized by fever and nausea/vomiting Patient has tested positive for influenza A Unlikely PTA/RPA: no hot potato voice, no uvular deviation, Unlikely Esophageal rupture: No history of dysphagia Unlikely deep space infection/[place]igs Low suspicion for CNS infection bacterial sinusitis, or pneumonia given exam and history.  Unlikely Strep or EBV as centor negative and with no pharyngeal exudate, posterior LAD, or splenomegaly.  Will attempt to alleviate symptoms conservatively; no overt indications at this time for antibiotics. No respiratory distress, otherwise relatively well appearing and nontoxic. Will discuss prompt follow up with PMD and strict return precautions.       ____________________________________________   FINAL CLINICAL IMPRESSION(S) / ED DIAGNOSES  Final diagnoses:  Fever, unspecified fever cause  Viral syndrome  Influenza A virus present  Enteritis     ED Discharge Orders          Ordered    oseltamivir (TAMIFLU) 75 MG capsule  2 times daily        06/15/21 1948    ondansetron (ZOFRAN ODT) 4 MG disintegrating tablet  Every 8 hours PRN        06/15/21 1948             Note:  This document was prepared using Dragon voice recognition software and may include unintentional dictation errors.    Naaman Plummer, MD 06/15/21 [place]

## 2021-06-15 NOTE — ED Triage Notes (Signed)
Pt comes into the ED via [facility] from home c/o unknown reasons at this time.  Pt did present to EMS as febrile.  Pt has weakness in legs that was noticed from ambulating to EMS stretcher.  151/57, 96%, CBG 162, 102.8 oral.

## 2021-06-15 NOTE — ED Triage Notes (Addendum)
See first nurse note- pt to ER via [facility].   Reports patient has been throwing up x2 days, no diarrhea. No known fevers. Pt also reports stomach pain. No urinary symptoms. Constant nausea.   No known covid contacts.

## 2021-06-15 NOTE — ED Notes (Signed)
Report received from [place], [place]

## 2021-06-16 LAB — CBG MONITORING, ED: Glucose-Capillary: 142 mg/dL — ABNORMAL HIGH (ref 70–99)

## 2021-10-25 ENCOUNTER — Emergency Department: Payer: Medicare Other

## 2021-10-25 ENCOUNTER — Inpatient Hospital Stay
Admission: EM | Admit: 2021-10-25 | Discharge: 2021-10-29 | DRG: 441 | Disposition: A | Discharge status: Home / Self Care | Payer: Medicare Other | Attending: Obstetrics and Gynecology | Admitting: Obstetrics and Gynecology

## 2021-10-25 ENCOUNTER — Other Ambulatory Visit: Payer: Self-pay

## 2021-10-25 ENCOUNTER — Encounter: Payer: Self-pay | Admitting: Emergency Medicine

## 2021-10-25 DIAGNOSIS — E873 Alkalosis: Secondary | ICD-10-CM | POA: Diagnosis not present

## 2021-10-25 DIAGNOSIS — M7662 Achilles tendinitis, left leg: Secondary | ICD-10-CM

## 2021-10-25 DIAGNOSIS — Z20822 Contact with and (suspected) exposure to covid-19: Secondary | ICD-10-CM | POA: Diagnosis present

## 2021-10-25 DIAGNOSIS — T473X6A Underdosing of saline and osmotic laxatives, initial encounter: Secondary | ICD-10-CM | POA: Diagnosis present

## 2021-10-25 DIAGNOSIS — K7460 Unspecified cirrhosis of liver: Secondary | ICD-10-CM

## 2021-10-25 DIAGNOSIS — N400 Enlarged prostate without lower urinary tract symptoms: Secondary | ICD-10-CM | POA: Diagnosis present

## 2021-10-25 DIAGNOSIS — Z8249 Family history of ischemic heart disease and other diseases of the circulatory system: Secondary | ICD-10-CM | POA: Diagnosis not present

## 2021-10-25 DIAGNOSIS — Y92009 Unspecified place in unspecified non-institutional (private) residence as the place of occurrence of the external cause: Secondary | ICD-10-CM

## 2021-10-25 DIAGNOSIS — N186 End stage renal disease: Secondary | ICD-10-CM

## 2021-10-25 DIAGNOSIS — M79672 Pain in left foot: Secondary | ICD-10-CM

## 2021-10-25 DIAGNOSIS — M109 Gout, unspecified: Secondary | ICD-10-CM | POA: Diagnosis present

## 2021-10-25 DIAGNOSIS — R531 Weakness: Secondary | ICD-10-CM | POA: Diagnosis present

## 2021-10-25 DIAGNOSIS — Z6827 Body mass index (BMI) 27.0-27.9, adult: Secondary | ICD-10-CM | POA: Diagnosis not present

## 2021-10-25 DIAGNOSIS — K7682 Hepatic encephalopathy: Secondary | ICD-10-CM | POA: Diagnosis present

## 2021-10-25 DIAGNOSIS — E1122 Type 2 diabetes mellitus with diabetic chronic kidney disease: Secondary | ICD-10-CM | POA: Diagnosis present

## 2021-10-25 DIAGNOSIS — D631 Anemia in chronic kidney disease: Secondary | ICD-10-CM | POA: Diagnosis present

## 2021-10-25 DIAGNOSIS — E440 Moderate protein-calorie malnutrition: Secondary | ICD-10-CM | POA: Insufficient documentation

## 2021-10-25 DIAGNOSIS — K219 Gastro-esophageal reflux disease without esophagitis: Secondary | ICD-10-CM | POA: Diagnosis present

## 2021-10-25 DIAGNOSIS — W19XXXA Unspecified fall, initial encounter: Secondary | ICD-10-CM | POA: Diagnosis present

## 2021-10-25 DIAGNOSIS — Z9181 History of falling: Secondary | ICD-10-CM

## 2021-10-25 DIAGNOSIS — N185 Chronic kidney disease, stage 5: Secondary | ICD-10-CM

## 2021-10-25 DIAGNOSIS — N2581 Secondary hyperparathyroidism of renal origin: Secondary | ICD-10-CM | POA: Diagnosis present

## 2021-10-25 DIAGNOSIS — Z91128 Patient's intentional underdosing of medication regimen for other reason: Secondary | ICD-10-CM | POA: Diagnosis not present

## 2021-10-25 DIAGNOSIS — E7220 Disorder of urea cycle metabolism, unspecified: Secondary | ICD-10-CM

## 2021-10-25 DIAGNOSIS — E119 Type 2 diabetes mellitus without complications: Secondary | ICD-10-CM

## 2021-10-25 DIAGNOSIS — Z79899 Other long term (current) drug therapy: Secondary | ICD-10-CM | POA: Diagnosis not present

## 2021-10-25 DIAGNOSIS — Z992 Dependence on renal dialysis: Secondary | ICD-10-CM | POA: Diagnosis not present

## 2021-10-25 DIAGNOSIS — I10 Essential (primary) hypertension: Secondary | ICD-10-CM | POA: Diagnosis present

## 2021-10-25 DIAGNOSIS — R638 Other symptoms and signs concerning food and fluid intake: Secondary | ICD-10-CM

## 2021-10-25 DIAGNOSIS — E785 Hyperlipidemia, unspecified: Secondary | ICD-10-CM | POA: Diagnosis present

## 2021-10-25 LAB — CBC WITH DIFFERENTIAL/PLATELET
Abs Immature Granulocytes: 0.03 10*3/uL (ref 0.00–0.07)
Basophils Absolute: 0 10*3/uL (ref 0.0–0.1)
Basophils Relative: 0 %
Eosinophils Absolute: 0.1 10*3/uL (ref 0.0–0.5)
Eosinophils Relative: 2 %
HCT: 27.3 % — ABNORMAL LOW (ref 39.0–52.0)
Hemoglobin: 8.8 g/dL — ABNORMAL LOW (ref 13.0–17.0)
Immature Granulocytes: 0 %
Lymphocytes Relative: 16 %
Lymphs Abs: 1.3 10*3/uL (ref 0.7–4.0)
MCH: 32.2 pg (ref 26.0–34.0)
MCHC: 32.2 g/dL (ref 30.0–36.0)
MCV: 100 fL (ref 80.0–100.0)
Monocytes Absolute: 0.7 10*3/uL (ref 0.1–1.0)
Monocytes Relative: 9 %
Neutro Abs: 6 10*3/uL (ref 1.7–7.7)
Neutrophils Relative %: 73 %
Platelets: 151 10*3/uL (ref 150–400)
RBC: 2.73 MIL/uL — ABNORMAL LOW (ref 4.22–5.81)
RDW: 15.5 % (ref 11.5–15.5)
WBC: 8.1 10*3/uL (ref 4.0–10.5)
nRBC: 0 % (ref 0.0–0.2)

## 2021-10-25 LAB — COMPREHENSIVE METABOLIC PANEL
ALT: 27 U/L (ref 0–44)
AST: 52 U/L — ABNORMAL HIGH (ref 15–41)
Albumin: 2.9 g/dL — ABNORMAL LOW (ref 3.5–5.0)
Alkaline Phosphatase: 327 U/L — ABNORMAL HIGH (ref 38–126)
Anion gap: 11 (ref 5–15)
BUN: 60 mg/dL — ABNORMAL HIGH (ref 8–23)
CO2: 25 mmol/L (ref 22–32)
Calcium: 8.3 mg/dL — ABNORMAL LOW (ref 8.9–10.3)
Chloride: 99 mmol/L (ref 98–111)
Creatinine, Ser: 6.27 mg/dL — ABNORMAL HIGH (ref 0.61–1.24)
GFR, Estimated: 8 mL/min — ABNORMAL LOW (ref >60)
Glucose, Bld: 118 mg/dL — ABNORMAL HIGH (ref 70–99)
Potassium: 3.7 mmol/L (ref 3.5–5.1)
Sodium: 135 mmol/L (ref 135–145)
Total Bilirubin: 1.2 mg/dL (ref 0.3–1.2)
Total Protein: 6.7 g/dL (ref 6.5–8.1)

## 2021-10-25 LAB — CK: Total CK: 86 U/L (ref 49–397)

## 2021-10-25 LAB — PROTIME-INR
INR: 1.2 (ref 0.8–1.2)
Prothrombin Time: 15.3 seconds — ABNORMAL HIGH (ref 11.4–15.2)

## 2021-10-25 LAB — RESP PANEL BY RT-PCR (FLU A&B, COVID) ARPGX2
Influenza A by PCR: NEGATIVE
Influenza B by PCR: NEGATIVE
SARS Coronavirus 2 by RT PCR: NEGATIVE

## 2021-10-25 LAB — AMMONIA: Ammonia: 50 umol/L — ABNORMAL HIGH (ref 9–35)

## 2021-10-25 LAB — APTT: aPTT: 43 seconds — ABNORMAL HIGH (ref 24–36)

## 2021-10-25 MED ORDER — ONDANSETRON HCL 4 MG/2ML IJ SOLN: 4.0000 mg | Freq: Four times a day (QID) | INTRAMUSCULAR | Status: DC | PRN

## 2021-10-25 MED ORDER — INSULIN ASPART 100 UNIT/ML IJ SOLN: 0.0000 [IU] | Freq: Every day | INTRAMUSCULAR | Status: DC

## 2021-10-25 MED ORDER — INSULIN ASPART 100 UNIT/ML IJ SOLN
0.0000 [IU] | Freq: Three times a day (TID) | INTRAMUSCULAR | Status: DC
  Administered 2021-10-26: 2 [IU] via SUBCUTANEOUS
  Administered 2021-10-27: 5 [IU] via SUBCUTANEOUS
  Administered 2021-10-27 – 2021-10-28 (×2): 3 [IU] via SUBCUTANEOUS
  Administered 2021-10-28: 2 [IU] via SUBCUTANEOUS
  Administered 2021-10-29 (×2): 3 [IU] via SUBCUTANEOUS
  Filled 2021-10-25 (×7): qty 1

## 2021-10-25 MED ORDER — ACETAMINOPHEN 325 MG PO TABS
650.0000 mg | ORAL_TABLET | Freq: Four times a day (QID) | ORAL | Status: DC | PRN
  Administered 2021-10-26: 650 mg via ORAL
  Filled 2021-10-25: qty 2

## 2021-10-25 MED ORDER — HYDROCODONE-ACETAMINOPHEN 5-325 MG PO TABS
1.0000 | ORAL_TABLET | Freq: Four times a day (QID) | ORAL | Status: DC | PRN
  Administered 2021-10-26 (×2): 1 via ORAL
  Filled 2021-10-25 (×3): qty 1

## 2021-10-25 MED ORDER — LACTULOSE 10 GM/15ML PO SOLN
30.0000 g | Freq: Three times a day (TID) | ORAL | Status: DC
  Administered 2021-10-26 (×2): 30 g via ORAL
  Filled 2021-10-25 (×2): qty 60

## 2021-10-25 MED ORDER — ONDANSETRON HCL 4 MG PO TABS: 4.0000 mg | ORAL_TABLET | Freq: Four times a day (QID) | ORAL | Status: DC | PRN

## 2021-10-25 MED ORDER — ENOXAPARIN SODIUM 40 MG/0.4ML IJ SOSY: 40.0000 mg | PREFILLED_SYRINGE | INTRAMUSCULAR | Status: DC

## 2021-10-25 MED ORDER — ACETAMINOPHEN 650 MG RE SUPP
650.0000 mg | Freq: Four times a day (QID) | RECTAL | Status: DC | PRN
  Filled 2021-10-25: qty 1

## 2021-10-25 NOTE — ED Triage Notes (Signed)
Pt in with pain and swelling to L ankle after trip fall at home, 1 wk ago. Swelling present to LLE, states he is unable to walk on it. HD pt, last dialyzed today. Denies hitting head or other injuries.

## 2021-10-25 NOTE — ED Notes (Signed)
Report given to Brandon, RN

## 2021-10-25 NOTE — ED Provider Notes (Signed)
[facility]EMERGENCY DEPARTMENT  ____________________________________________  Time seen: Approximately 8:12 PM  I have reviewed the triage vital signs and the nursing notes.   HISTORY  Chief Complaint Ankle Pain   Historian Patient     HPI Martin Gilbert is a 80 y.o. 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  Patient states that he fell in his house and has been unable to bear weight.  Patient states that he has been unable to walk at all at home.  Patient is Spanish-speaking and has difficulty providing historical details even with a Patent attorney.  I contacted patient's daughter by phone who states that patient has a history of cirrhosis and has not been taking lactulose as he has not been able to ambulate to go to the restroom and she has concern for mild altered mental status.   Past Medical History:  Diagnosis Date   Anemia    Arthritis    Back pain    BPH (benign prostatic hyperplasia)    CKD (chronic kidney disease), stage IV ([name])    Clostridium difficile colitis    Diabetes mellitus without complication ([name])    GERD (gastroesophageal reflux disease)    HTN (hypertension)    Hyperlipidemia      Immunizations up to date:  Yes.     Past Medical History:  Diagnosis Date   Anemia    Arthritis    Back pain    BPH (benign prostatic hyperplasia)    CKD (chronic kidney disease), stage IV ([name])    Clostridium difficile colitis    Diabetes mellitus without complication ([name])    GERD (gastroesophageal reflux disease)    HTN (hypertension)    Hyperlipidemia     Patient Active Problem List   Diagnosis Date Noted   Anemia of chronic kidney failure, stage 5 ([place]) 10/25/2021   Diabetes mellitus, type II ([place]) 10/25/2021   History of recent fall 10/25/2021   Hyperammonemia ([place])  10/25/2021   Hepatic cirrhosis ([place]) 10/25/2021   Inadequate oral intake 10/25/2021   Norovirus 04/15/2021   Hypotension    Dehydration    Hypomagnesemia    Acute colitis 95/28/4132   Acute metabolic encephalopathy 44/12/270   Acute gastroenteritis 10/11/2018   Intractable nausea and vomiting 10/10/2018   Sacral fracture ([place]) 53/66/4403   Complication of arteriovenous dialysis fistula 09/01/2017   ESRD on hemodialysis ([place]) 06/03/2017   Colitis 03/27/2017   Demand ischemia ([place]) 03/27/2017   Enteritis due to Clostridium difficile    Protein-calorie malnutrition, severe 02/18/2017   C. difficile colitis 02/15/2017   UTI (urinary tract infection) 02/02/2017   E coli infection 02/02/2017   Lactic acidosis 02/02/2017   Diabetic neuropathy ([place]) 02/02/2017   Left-sided chest wall pain 02/02/2017   Fall 02/02/2017   Generalized weakness 02/02/2017   Pink eye, left 02/02/2017   Leukocytosis 02/02/2017   Anemia of chronic disease 02/02/2017   Nausea and vomiting 02/02/2017   Sepsis ([place]) 01/31/2017   Left foot pain 10/08/2016   Osteoarthritis of knee 10/08/2016   Hypertension 11/28/2015   Chronic gout due to renal impairment, right ankle and foot, without tophus (tophi) 07/31/2015   Bone lesion 06/05/2015   Primary osteoarthritis of both knees 06/05/2015   Pain in the chest 02/15/2015   Neck pain 02/15/2015   Diabetes type 2, uncontrolled 02/15/2015   Frequent headaches 02/15/2015  Type 2 diabetes mellitus with hyperglycemia ([place]) 02/15/2015   Hypertrophy of prostate with urinary obstruction and other lower urinary tract symptoms (LUTS) 09/07/2013    Past Surgical History:  Procedure Laterality Date   A/V FISTULAGRAM Left 04/20/2018   Procedure: A/V FISTULAGRAM;  Surgeon: Algernon Huxley, MD;  Location: [facility];  Service: Cardiovascular;  Laterality: Left;   A/V FISTULAGRAM Left 11/25/2018   Procedure: A/V FISTULAGRAM;  Surgeon: Algernon Huxley, MD;  Location: [facility];  Service: Cardiovascular;  Laterality: Left;   A/V FISTULAGRAM Left 01/11/2019   Procedure: A/V FISTULAGRAM;  Surgeon: Algernon Huxley, MD;  Location: [facility];  Service: Cardiovascular;  Laterality: Left;   AV FISTULA PLACEMENT Left 06/13/2017   Procedure: ARTERIOVENOUS (AV) FISTULA CREATION ( RADIAL CEPHALIC );  Surgeon: Katha Cabal, MD;  Location: [facility];  Service: Vascular;  Laterality: Left;   CATARACT EXTRACTION     one eye not sure which   DIALYSIS/PERMA CATHETER INSERTION N/A 04/03/2017   Procedure: Dialysis/Perma Catheter Insertion;  Surgeon: Algernon Huxley, MD;  Location: [facility];  Service: Cardiovascular;  Laterality: N/A;   DIALYSIS/PERMA CATHETER INSERTION N/A 08/06/2017   Procedure: DIALYSIS/PERMA CATHETER INSERTION;  Surgeon: Algernon Huxley, MD;  Location: [facility];  Service: Cardiovascular;  Laterality: N/A;   DIALYSIS/PERMA CATHETER REMOVAL N/A 08/04/2017   Procedure: DIALYSIS/PERMA CATHETER REMOVAL;  Surgeon: Algernon Huxley, MD;  Location: [facility];  Service: Cardiovascular;  Laterality: N/A;   DIALYSIS/PERMA CATHETER REMOVAL N/A 12/31/2017   Procedure: DIALYSIS/PERMA CATHETER REMOVAL;  Surgeon: Algernon Huxley, MD;  Location: [facility];  Service: Cardiovascular;  Laterality: N/A;   hernia repair     SACROPLASTY N/A 09/30/2018   Procedure: SACROPLASTY;  Surgeon: Hessie Knows, MD;  Location: [facility];  Service: Orthopedics;  Laterality: N/A;   UPPER EXTREMITY ANGIOGRAPHY Left 09/05/2017   Procedure: Upper Extremity Angiography;  Surgeon: Katha Cabal, MD;  Location: [facility];  Service: Cardiovascular;  Laterality: Left;    Prior to Admission medications   Medication Sig Start Date End Date Taking? Authorizing Provider  allopurinol (ZYLOPRIM) 100 MG tablet Take 100 mg by mouth daily. 10/17/15  Yes [provider]  ferrous sulfate 325 (65 FE) MG tablet Take 325 mg by mouth 2 (two)  times daily with a meal.  08/30/15  Yes [provider]  hydrALAZINE (APRESOLINE) 50 MG tablet Take 50 mg by mouth 3 (three) times daily.   Yes [provider]  metoprolol succinate (TOPROL-XL) 25 MG 24 hr tablet Take 25 mg by mouth daily.   Yes [provider]  pantoprazole (PROTONIX) 40 MG tablet Take 40 mg by mouth daily. 10/10/21  Yes [provider]  tamsulosin (FLOMAX) 0.4 MG CAPS capsule Take 1 capsule (0.4 mg total) by mouth daily after supper. 02/19/17  Yes Epifanio Lesches, MD  acetaminophen (TYLENOL) 325 MG tablet Take 2 tablets (650 mg total) by mouth every 6 (six) hours as needed for mild pain (or Fever >/= 101). 11/28/17   Gouru, Illene Silver, MD  atorvastatin (LIPITOR) 20 MG tablet Take 20 mg by mouth daily.    [provider]  famotidine (PEPCID) 20 MG tablet Take 1 tablet (20 mg total) by mouth daily. 04/16/21 04/16/22  Eugenie Filler, MD  lactulose (CHRONULAC) 10 GM/15ML solution Take 20 g (30 mL) 3-4 times a day for a goal of at least 2 bowel movements a  day. 04/28/21   Eugenie Filler, MD  lidocaine-prilocaine (EMLA) cream Apply 1 application topically daily as needed. Patient not taking: Reported on 04/16/2021 10/16/17   [provider]  multivitamin (RENA-VIT) TABS tablet Take 1 tablet by mouth at bedtime. Patient not taking: Reported on 04/16/2021 10/14/18   MayoPete Pelt, MD  ondansetron (ZOFRAN ODT) 4 MG disintegrating tablet Take 1 tablet (4 mg total) by mouth every 8 (eight) hours as needed for nausea or vomiting. 06/15/21   Naaman Plummer, MD  sodium bicarbonate 650 MG tablet Take 1 tablet (650 mg total) by mouth daily. 04/17/21   Eugenie Filler, MD  Vitamin D, Cholecalciferol, 1000 UNITS TABS Take 1 tablet by mouth daily.     [provider]    Allergies Patient has no known allergies.  Family History  Problem Relation Age of Onset   Hypertension Mother    Diabetes Neg Hx     Social History Social  History   Tobacco Use   Smoking status: Never   Smokeless tobacco: Never  Vaping Use   Vaping Use: Never used  Substance Use Topics   Alcohol use: No    Comment: Drank in the past, but has stopped for several years.    Drug use: No     Review of Systems  Constitutional: No fever/chills Eyes:  No discharge ENT: No upper respiratory complaints. Respiratory: no cough. No SOB/ use of accessory muscles to breath Gastrointestinal:   No nausea, no vomiting.  No diarrhea.  No constipation. Musculoskeletal: Patient has left ankle pain.  Skin: Negative for rash, abrasions, lacerations, ecchymosis.    ____________________________________________   PHYSICAL EXAM:  VITAL SIGNS: ED Triage Vitals  Enc Vitals Group     BP 10/25/21 1935 (!) 148/48     Pulse Rate 10/25/21 1935 72     Resp 10/25/21 1935 18     Temp 10/25/21 1935 98.7 F (37.1 C)     Temp Source 10/25/21 1935 Oral     SpO2 10/25/21 1935 94 %     Weight 10/25/21 1943 158 lb 11.7 oz (72 kg)     Height --      Head Circumference --      Peak Flow --      Pain Score --      Pain Loc --      Pain Edu? --      Excl. in [place]? --      Constitutional: Alert and oriented. Well appearing and in no acute distress. Eyes: Conjunctivae are normal. PERRL. EOMI. Head: Atraumatic. ENT:  Cardiovascular: Normal rate, regular rhythm. Normal S1 and S2.  Good peripheral circulation. Respiratory: Normal respiratory effort without tachypnea or retractions. Lungs CTAB. Good air entry to the bases with no decreased or absent breath sounds Gastrointestinal: Bowel sounds x 4 quadrants. Soft and nontender to palpation. No guarding or rigidity. No distention. Musculoskeletal: Patient's left ankle appears effused with mild Swelling.  Palpable dorsalis pedis pulse, left. Neurologic:  Normal for age. No gross focal neurologic deficits are appreciated.  Skin:  Skin is warm, dry and intact. No rash noted. Psychiatric: Mood and affect are normal  for age. Speech and behavior are normal.   ____________________________________________   LABS (all labs ordered are listed, but only abnormal results are displayed)  Labs Reviewed  CBC WITH DIFFERENTIAL/PLATELET - Abnormal; Notable for the following components:      Result Value   RBC 2.73 (*)    Hemoglobin 8.8 (*)  HCT 27.3 (*)    All other components within normal limits  COMPREHENSIVE METABOLIC PANEL - Abnormal; Notable for the following components:   Glucose, Bld 118 (*)    BUN 60 (*)    Creatinine, Ser 6.27 (*)    Calcium 8.3 (*)    Albumin 2.9 (*)    AST 52 (*)    Alkaline Phosphatase 327 (*)    GFR, Estimated 8 (*)    All other components within normal limits  PROTIME-INR - Abnormal; Notable for the following components:   Prothrombin Time 15.3 (*)    All other components within normal limits  APTT - Abnormal; Notable for the following components:   aPTT 43 (*)    All other components within normal limits  AMMONIA - Abnormal; Notable for the following components:   Ammonia 50 (*)    All other components within normal limits  RESP PANEL BY RT-PCR (FLU A&B, COVID) ARPGX2  CK  URINALYSIS, COMPLETE (UACMP) WITH MICROSCOPIC  HEMOGLOBIN A1C  COMPREHENSIVE METABOLIC PANEL  CBC  AMMONIA   ____________________________________________  EKG   ____________________________________________  RADIOLOGY Unk Pinto, personally viewed and evaluated these images (plain radiographs) as part of my medical decision making, as well as reviewing the written report by the radiologist.    DG Ankle Complete Left  Result Date: 10/25/2021 CLINICAL DATA:  Status post fall EXAM: LEFT ANKLE COMPLETE - 3+ VIEW COMPARISON:  None. FINDINGS: Diffusely decreased bone density. There is no evidence of fracture, dislocation, or joint effusion. There is no evidence of arthropathy or other focal bone abnormality. Diffuse subcutaneus soft tissue edema. Vascular calcifications.  IMPRESSION: 1. No acute displaced fracture or dislocation. 2. Diffusely decreased bone density. Electronically Signed   By: Iven Finn M.D.   On: 10/25/2021 20:33   US Venous Img Lower Unilateral Left  Result Date: 10/25/2021 CLINICAL DATA:  Pain and swelling EXAM: Left LOWER EXTREMITY VENOUS DOPPLER ULTRASOUND TECHNIQUE: Gray-scale sonography with compression, as well as color and duplex ultrasound, were performed to evaluate the deep venous system(s) from the level of the common femoral vein through the popliteal and proximal calf veins. COMPARISON:  None. FINDINGS: VENOUS Normal compressibility of the common femoral, superficial femoral, and popliteal veins, as well as the visualized calf veins. Visualized portions of profunda femoral vein and great saphenous vein unremarkable. No filling defects to suggest DVT on grayscale or color Doppler imaging. Doppler waveforms show normal direction of venous flow, normal respiratory plasticity and response to augmentation. Limited views of the contralateral common femoral vein are unremarkable. OTHER None. Limitations: none IMPRESSION: No deep venous thrombosis left lower extremity. Electronically Signed   By: Iven Finn M.D.   On: 10/25/2021 21:33    ____________________________________________    PROCEDURES  Procedure(s) performed:     Procedures     Medications  HYDROcodone-acetaminophen (NORCO/VICODIN) 5-325 MG per tablet 1 tablet (has no administration in time range)  insulin aspart (novoLOG) injection 0-15 Units (has no administration in time range)  insulin aspart (novoLOG) injection 0-5 Units (has no administration in time range)  acetaminophen (TYLENOL) tablet 650 mg (has no administration in time range)    Or  acetaminophen (TYLENOL) suppository 650 mg (has no administration in time range)  ondansetron (ZOFRAN) tablet 4 mg (has no administration in time range)    Or  ondansetron (ZOFRAN) injection 4 mg (has no  administration in time range)  lactulose (CHRONULAC) 10 GM/15ML solution 30 g (has no administration in time range)  ____________________________________________   INITIAL IMPRESSION / ASSESSMENT AND PLAN / ED COURSE  Pertinent labs & imaging results that were available during my care of the patient were reviewed by me and considered in my medical decision making (see chart for details).      Assessment and Plan:  Weakness  Metabolic encephalopathy 80 year old male presents to the emergency department with left ankle pain and mild confusion at home.  Vital signs were reassuring at triage.  On physical exam, patient had difficulty providing historical information.  He seemed disheveled.  Daughter states that patient has been unable to ambulate at home and that he has been foregoing his lactulose so that he does not have to go to the bathroom.  Ammonia level was elevated in the emergency department at 50.  CBC and CMP consistent with patient's baseline.  CK within reference range.  COVID-19 and influenza testing results were negative.  X-ray of the left ankle showed no acute bony abnormality and patient had no evidence of DVT within the left lower extremity.  Will admit to hospitalist service under the care of Dr. Damita Dunnings.  Very much appreciate care for weakness, metabolic encephalopathy and social work/PT consult.     ____________________________________________  FINAL CLINICAL IMPRESSION(S) / ED DIAGNOSES  Final diagnoses:  Weakness      NEW MEDICATIONS STARTED DURING THIS VISIT:  ED Discharge Orders     None           This chart was dictated using voice recognition software/Dragon. Despite best efforts to proofread, errors can occur which can change the meaning. Any change was purely unintentional.     Lannie Fields, PA-C 10/25/21 2357    Lavonia Drafts, MD 10/27/21 [phone]

## 2021-10-25 NOTE — ED Notes (Signed)
Pt in room at this time. NADN

## 2021-10-25 NOTE — ED Notes (Signed)
Green top tube on ice sent to lab.

## 2021-10-25 NOTE — H&P (Signed)
History and Physical    Martin Gilbert WPY:[id number] DOB: Sep 06, 1941 DOA: 10/25/2021  PCP: [name], MD   Patient coming from: home  I have personally briefly reviewed patient's relevant medical records in [place]  Chief Complaint: weakness, left foot pain  HPI: Martin Gilbert is a 80 y.o. male with medical history significant for DM, HTN, ESRD on HD TTS, anemia of CKD, hepatic cirrhosis, who was sent to the ED from dialysis because of generalized weakness and left foot pain that prevents him from ambulating.  Patient fell a week ago injuring his left ankle and has been unable to ambulate on the foot since.  Patient at baseline ambulates independently and takes care of his wife and the home.  He states he overall feels very weak and has no appetite.  He denies chest pain, shortness of breath, fever or chills.  Denies nausea or vomiting and denies diarrhea.  States his biggest problem is the pain in his left foot and ankle and the weakness he feels.  Patient states he has not been taking his medication due to his poor oral intake.  ED course: On arrival, BP 148/48 with otherwise normal vitals Blood work: For the most part at baseline for his chronic kidney disease and cirrhosis.  Ammonia level elevated at 58.  Hemoglobin 8.8 which appears to be baseline as well.  CK normal at 86.  EKG is pending  Imaging: Left ankle x-ray with no acute displaced fracture or dislocation Left venous Doppler with no acute DVT  Hospitalist consulted for admission.    Review of Systems: As per HPI otherwise all other systems on review of systems negative.    Past Medical History:  Diagnosis Date   Anemia    Arthritis    Back pain    BPH (benign prostatic hyperplasia)    CKD (chronic kidney disease), stage IV ([name])    Clostridium difficile colitis    Diabetes mellitus without complication ([name])    GERD (gastroesophageal reflux disease)    HTN (hypertension)     Hyperlipidemia     Past Surgical History:  Procedure Laterality Date   A/V FISTULAGRAM Left 04/20/2018   Procedure: A/V FISTULAGRAM;  Surgeon: Algernon Huxley, MD;  Location: [facility] [facility];  Service: Cardiovascular;  Laterality: Left;   A/V FISTULAGRAM Left 11/25/2018   Procedure: A/V FISTULAGRAM;  Surgeon: Algernon Huxley, MD;  Location: [facility] [place] [facility];  Service: Cardiovascular;  Laterality: Left;   A/V FISTULAGRAM Left 01/11/2019   Procedure: A/V FISTULAGRAM;  Surgeon: Algernon Huxley, MD;  Location: [facility];  Service: Cardiovascular;  Laterality: Left;   AV FISTULA PLACEMENT Left 06/13/2017   Procedure: ARTERIOVENOUS (AV) FISTULA CREATION ( RADIAL CEPHALIC );  Surgeon: Katha Cabal, MD;  Location: [facility];  Service: Vascular;  Laterality: Left;   CATARACT EXTRACTION     one eye not sure which   DIALYSIS/PERMA CATHETER INSERTION N/A 04/03/2017   Procedure: Dialysis/Perma Catheter Insertion;  Surgeon: Algernon Huxley, MD;  Location: [place] [facility];  Service: Cardiovascular;  Laterality: N/A;   DIALYSIS/PERMA CATHETER INSERTION N/A 08/06/2017   Procedure: DIALYSIS/PERMA CATHETER INSERTION;  Surgeon: Algernon Huxley, MD;  Location: [place] [facility];  Service: Cardiovascular;  Laterality: N/A;   DIALYSIS/PERMA CATHETER REMOVAL N/A 08/04/2017   Procedure: DIALYSIS/PERMA CATHETER REMOVAL;  Surgeon: Algernon Huxley, MD;  Location: [facility];  Service: Cardiovascular;  Laterality: N/A;   DIALYSIS/PERMA CATHETER REMOVAL N/A 12/31/2017  Procedure: DIALYSIS/PERMA CATHETER REMOVAL;  Surgeon: Algernon Huxley, MD;  Location: [place] [facility];  Service: Cardiovascular;  Laterality: N/A;   hernia repair     SACROPLASTY N/A 09/30/2018   Procedure: SACROPLASTY;  Surgeon: Hessie Knows, MD;  Location: [facility];  Service: Orthopedics;  Laterality: N/A;   UPPER EXTREMITY ANGIOGRAPHY Left 09/05/2017   Procedure: Upper Extremity Angiography;  Surgeon: Katha Cabal, MD;   Location: [place];  Service: Cardiovascular;  Laterality: Left;     reports that he has never smoked. He has never used smokeless tobacco. He reports that he does not drink alcohol and does not use drugs.  No Known Allergies  Family History  Problem Relation Age of Onset   Hypertension Mother    Diabetes Neg Hx       Prior to Admission medications   Medication Sig Start Date End Date Taking? Authorizing Provider  allopurinol (ZYLOPRIM) 100 MG tablet Take 100 mg by mouth daily. 10/17/15  Yes [provider]  ferrous sulfate 325 (65 FE) MG tablet Take 325 mg by mouth 2 (two) times daily with a meal.  08/30/15  Yes [provider]  hydrALAZINE (APRESOLINE) 50 MG tablet Take 50 mg by mouth 3 (three) times daily.   Yes [provider]  metoprolol succinate (TOPROL-XL) 25 MG 24 hr tablet Take 25 mg by mouth daily.   Yes [provider]  pantoprazole (PROTONIX) 40 MG tablet Take 40 mg by mouth daily. 10/10/21  Yes [provider]  tamsulosin (FLOMAX) 0.4 MG CAPS capsule Take 1 capsule (0.4 mg total) by mouth daily after supper. 02/19/17  Yes Epifanio Lesches, MD  acetaminophen (TYLENOL) 325 MG tablet Take 2 tablets (650 mg total) by mouth every 6 (six) hours as needed for mild pain (or Fever >/= 101). 11/28/17   Gouru, Illene Silver, MD  atorvastatin (LIPITOR) 20 MG tablet Take 20 mg by mouth daily.    [provider]  famotidine (PEPCID) 20 MG tablet Take 1 tablet (20 mg total) by mouth daily. 04/16/21 04/16/22  Eugenie Filler, MD  lactulose (CHRONULAC) 10 GM/15ML solution Take 20 g (30 mL) 3-4 times a day for a goal of at least 2 bowel movements a day. 04/28/21   Eugenie Filler, MD  lidocaine-prilocaine (EMLA) cream Apply 1 application topically daily as needed. Patient not taking: Reported on 04/16/2021 10/16/17   [provider]  multivitamin (RENA-VIT) TABS tablet Take 1 tablet by mouth at bedtime. Patient not taking:  Reported on 04/16/2021 10/14/18   MayoPete Pelt, MD  ondansetron (ZOFRAN ODT) 4 MG disintegrating tablet Take 1 tablet (4 mg total) by mouth every 8 (eight) hours as needed for nausea or vomiting. 06/15/21   Naaman Plummer, MD  sodium bicarbonate 650 MG tablet Take 1 tablet (650 mg total) by mouth daily. 04/17/21   Eugenie Filler, MD  Vitamin D, Cholecalciferol, 1000 UNITS TABS Take 1 tablet by mouth daily.     [provider]    Physical Exam: Vitals:   10/25/21 1935 10/25/21 1943 10/25/21 1944 10/25/21 2345  BP: (!) 148/48   (!) 149/49  Pulse: 72   73  Resp: 18   20  Temp: 98.7 F (37.1 C)   98.5 F (36.9 C)  TempSrc: Oral   Oral  SpO2: 94%   99%  Weight:  72 kg 75 kg    Constitutional: Chronically frail and ill-appearing and oriented x 3 . Not in any apparent  distress HEENT:      Head: Normocephalic and atraumatic.         Eyes: PERLA, EOMI, Conjunctivae are normal. Sclera is non-icteric.       Mouth/Throat: Mucous membranes are moist.       Neck: Supple with no signs of meningismus. Cardiovascular: Regular rate and rhythm. No murmurs, gallops, or rubs. 2+ symmetrical distal pulses are present . No JVD. No  LE edema Respiratory: Respiratory effort normal .Lungs sounds clear bilaterally. No wheezes, crackles, or rhonchi.  Gastrointestinal: Soft, non tender, mild abdominal distention. Positive bowel sounds.  Genitourinary: No CVA tenderness. Musculoskeletal: Swelling and tenderness left ankle with pain on range of motion. No cyanosis, or erythema of extremities. Neurologic:  Face is symmetric. Moving all extremities. No gross focal neurologic deficits . Skin: Skin is warm, dry.  No rash or ulcers Psychiatric: Mood and affect are appropriate    Labs on Admission: I have personally reviewed following labs and imaging studies  CBC: Recent Labs  Lab 10/25/21 2015  WBC 8.1  NEUTROABS 6.0  HGB 8.8*  HCT 27.3*  MCV 100.0  PLT 347   Basic Metabolic Panel: Recent  Labs  Lab 10/25/21 2015  NA 135  K 3.7  CL 99  CO2 25  GLUCOSE 118*  BUN 60*  CREATININE 6.27*  CALCIUM 8.3*   GFR: Estimated Creatinine Clearance: 8.7 mL/min (A) (by C-G formula based on SCr of 6.27 mg/dL (H)). Liver Function Tests: Recent Labs  Lab 10/25/21 2015  AST 52*  ALT 27  ALKPHOS 327*  BILITOT 1.2  PROT 6.7  ALBUMIN 2.9*   No results for input(s): LIPASE, AMYLASE in the last 168 hours. Recent Labs  Lab 10/25/21 2213  AMMONIA 50*   Coagulation Profile: Recent Labs  Lab 10/25/21 2016  INR 1.2   Cardiac Enzymes: Recent Labs  Lab 10/25/21 2015  CKTOTAL 86   BNP (last 3 results) No results for input(s): PROBNP in the last 8760 hours. HbA1C: No results for input(s): HGBA1C in the last 72 hours. CBG: No results for input(s): GLUCAP in the last 168 hours. Lipid Profile: No results for input(s): CHOL, HDL, LDLCALC, TRIG, CHOLHDL, LDLDIRECT in the last 72 hours. Thyroid Function Tests: No results for input(s): TSH, T4TOTAL, FREET4, T3FREE, THYROIDAB in the last 72 hours. Anemia Panel: No results for input(s): VITAMINB12, FOLATE, FERRITIN, TIBC, IRON, RETICCTPCT in the last 72 hours. Urine analysis:    Component Value Date/Time   COLORURINE RED (A) 07/20/2020 1851   APPEARANCEUR TURBID (A) 07/20/2020 1851   APPEARANCEUR Turbid 08/29/2013 2049   LABSPEC 1.020 07/20/2020 1851   LABSPEC 1.015 08/29/2013 2049   PHURINE  07/20/2020 1851    TEST NOT REPORTED DUE TO COLOR INTERFERENCE OF URINE PIGMENT   GLUCOSEU (A) 07/20/2020 1851    TEST NOT REPORTED DUE TO COLOR INTERFERENCE OF URINE PIGMENT   GLUCOSEU 50 mg/dL 08/29/2013 2049   HGBUR (A) 07/20/2020 1851    TEST NOT REPORTED DUE TO COLOR INTERFERENCE OF URINE PIGMENT   BILIRUBINUR (A) 07/20/2020 1851    TEST NOT REPORTED DUE TO COLOR INTERFERENCE OF URINE PIGMENT   BILIRUBINUR Negative 08/29/2013 2049   KETONESUR (A) 07/20/2020 1851    TEST NOT REPORTED DUE TO COLOR INTERFERENCE OF URINE PIGMENT    PROTEINUR (A) 07/20/2020 1851    TEST NOT REPORTED DUE TO COLOR INTERFERENCE OF URINE PIGMENT   NITRITE (A) 07/20/2020 1851    TEST NOT REPORTED DUE TO COLOR INTERFERENCE OF URINE PIGMENT  LEUKOCYTESUR (A) 07/20/2020 1851    TEST NOT REPORTED DUE TO COLOR INTERFERENCE OF URINE PIGMENT   LEUKOCYTESUR 3+ 08/29/2013 2049    Radiological Exams on Admission: DG Ankle Complete Left  Result Date: 10/25/2021 CLINICAL DATA:  Status post fall EXAM: LEFT ANKLE COMPLETE - 3+ VIEW COMPARISON:  None. FINDINGS: Diffusely decreased bone density. There is no evidence of fracture, dislocation, or joint effusion. There is no evidence of arthropathy or other focal bone abnormality. Diffuse subcutaneus soft tissue edema. Vascular calcifications. IMPRESSION: 1. No acute displaced fracture or dislocation. 2. Diffusely decreased bone density. Electronically Signed   By: Iven Finn M.D.   On: 10/25/2021 20:33   US Venous Img Lower Unilateral Left  Result Date: 10/25/2021 CLINICAL DATA:  Pain and swelling EXAM: Left LOWER EXTREMITY VENOUS DOPPLER ULTRASOUND TECHNIQUE: Gray-scale sonography with compression, as well as color and duplex ultrasound, were performed to evaluate the deep venous system(s) from the level of the common femoral vein through the popliteal and proximal calf veins. COMPARISON:  None. FINDINGS: VENOUS Normal compressibility of the common femoral, superficial femoral, and popliteal veins, as well as the visualized calf veins. Visualized portions of profunda femoral vein and great saphenous vein unremarkable. No filling defects to suggest DVT on grayscale or color Doppler imaging. Doppler waveforms show normal direction of venous flow, normal respiratory plasticity and response to augmentation. Limited views of the contralateral common femoral vein are unremarkable. OTHER None. Limitations: none IMPRESSION: No deep venous thrombosis left lower extremity. Electronically Signed   By: Iven Finn  M.D.   On: 10/25/2021 21:33    Assessment/Plan    Generalized weakness   Inadequate oral intake -Secondary to poor oral intake, possibly mild hepatic encephalopathy - PT OT and dietary consults - Treat hyperammonemia    Hyperammonemia ([name])   Hepatic cirrhosis ([name]) -Ammonia level of 50 - Lactulose 3 times daily - Monitor ammonia level    Left foot pain   History of recent fall - X-ray without acute injury but ankle swollen on exam - Podiatry consult - Pain control      Hypertension - Holding BP meds tonight as blood pressure somewhat soft    ESRD on hemodialysis [facility]) - Nephrology consult for continuation of dialysis    Anemia of chronic kidney failure, stage 5 -Hemoglobin 8.8 which is baseline for the past 4 months however previously baseline was 11-12 - Continue to monitor H&H    Diabetes mellitus, type II ([name]) - Sliding scale insulin coverage    DVT prophylaxis: Heparin Code Status: full code  Family Communication:  none  Disposition Plan: Back to previous home environment Consults called: Nephrology podiatry Status:At the time of admission, it appears that the appropriate admission status for this patient is INPATIENT. This is judged to be reasonable and necessary in order to provide the required intensity of service to ensure the patient's safety given the presenting symptoms, physical exam findings, and initial radiographic and laboratory data in the context of their  Comorbid conditions.   Patient requires inpatient status due to high intensity of service, high risk for further deterioration and high frequency of surveillance required.   I certify that at the point of admission it is my clinical judgment that the patient will require inpatient hospital care spanning beyond [place] MD [facility]   10/25/2021, 11:46 PM

## 2021-10-25 NOTE — ED Notes (Signed)
U/S tech at [facility].

## 2021-10-26 ENCOUNTER — Encounter: Payer: Self-pay | Admitting: Internal Medicine

## 2021-10-26 DIAGNOSIS — R531 Weakness: Secondary | ICD-10-CM | POA: Diagnosis not present

## 2021-10-26 DIAGNOSIS — E440 Moderate protein-calorie malnutrition: Secondary | ICD-10-CM | POA: Insufficient documentation

## 2021-10-26 LAB — CBC
HCT: 26 % — ABNORMAL LOW (ref 39.0–52.0)
Hemoglobin: 8.5 g/dL — ABNORMAL LOW (ref 13.0–17.0)
MCH: 32.8 pg (ref 26.0–34.0)
MCHC: 32.7 g/dL (ref 30.0–36.0)
MCV: 100.4 fL — ABNORMAL HIGH (ref 80.0–100.0)
Platelets: 153 10*3/uL (ref 150–400)
RBC: 2.59 MIL/uL — ABNORMAL LOW (ref 4.22–5.81)
RDW: 15.3 % (ref 11.5–15.5)
WBC: 7.5 10*3/uL (ref 4.0–10.5)
nRBC: 0 % (ref 0.0–0.2)

## 2021-10-26 LAB — GLUCOSE, CAPILLARY
Glucose-Capillary: 97 mg/dL (ref 70–99)
Glucose-Capillary: 102 mg/dL — ABNORMAL HIGH (ref 70–99)
Glucose-Capillary: 152 mg/dL — ABNORMAL HIGH (ref 70–99)
Glucose-Capillary: 103 mg/dL — ABNORMAL HIGH (ref 70–99)
Glucose-Capillary: 112 mg/dL — ABNORMAL HIGH (ref 70–99)

## 2021-10-26 LAB — COMPREHENSIVE METABOLIC PANEL
ALT: 26 U/L (ref 0–44)
AST: 41 U/L (ref 15–41)
Albumin: 2.6 g/dL — ABNORMAL LOW (ref 3.5–5.0)
Alkaline Phosphatase: 298 U/L — ABNORMAL HIGH (ref 38–126)
Anion gap: 12 (ref 5–15)
BUN: 62 mg/dL — ABNORMAL HIGH (ref 8–23)
CO2: 25 mmol/L (ref 22–32)
Calcium: 8.2 mg/dL — ABNORMAL LOW (ref 8.9–10.3)
Chloride: 100 mmol/L (ref 98–111)
Creatinine, Ser: 7.26 mg/dL — ABNORMAL HIGH (ref 0.61–1.24)
GFR, Estimated: 7 mL/min — ABNORMAL LOW (ref >60)
Glucose, Bld: 99 mg/dL (ref 70–99)
Potassium: 3.9 mmol/L (ref 3.5–5.1)
Sodium: 137 mmol/L (ref 135–145)
Total Bilirubin: 1.1 mg/dL (ref 0.3–1.2)
Total Protein: 6.5 g/dL (ref 6.5–8.1)

## 2021-10-26 LAB — AMMONIA: Ammonia: 71 umol/L — ABNORMAL HIGH (ref 9–35)

## 2021-10-26 LAB — HEMOGLOBIN A1C
Hgb A1c MFr Bld: 4.8 % (ref 4.8–5.6)
Mean Plasma Glucose: 91.06 mg/dL

## 2021-10-26 MED ORDER — ALLOPURINOL 100 MG PO TABS
100.0000 mg | ORAL_TABLET | Freq: Every day | ORAL | Status: DC
  Administered 2021-10-26 – 2021-10-29 (×4): 100 mg via ORAL
  Filled 2021-10-26 (×4): qty 1

## 2021-10-26 MED ORDER — NEPRO/CARBSTEADY PO LIQD: 237.0000 mL | Freq: Two times a day (BID) | ORAL | Status: DC

## 2021-10-26 MED ORDER — NEPRO/CARBSTEADY PO LIQD
237.0000 mL | Freq: Three times a day (TID) | ORAL | Status: DC
  Administered 2021-10-26 – 2021-10-29 (×7): 237 mL via ORAL

## 2021-10-26 MED ORDER — PANTOPRAZOLE SODIUM 40 MG PO TBEC
40.0000 mg | DELAYED_RELEASE_TABLET | Freq: Every day | ORAL | Status: DC
  Administered 2021-10-26 – 2021-10-29 (×4): 40 mg via ORAL
  Filled 2021-10-26 (×4): qty 1

## 2021-10-26 MED ORDER — HYDRALAZINE HCL 50 MG PO TABS
50.0000 mg | ORAL_TABLET | Freq: Three times a day (TID) | ORAL | Status: DC
  Administered 2021-10-26 – 2021-10-29 (×7): 50 mg via ORAL
  Filled 2021-10-26 (×9): qty 1

## 2021-10-26 MED ORDER — HEPARIN SODIUM (PORCINE) 5000 UNIT/ML IJ SOLN
5000.0000 [IU] | Freq: Three times a day (TID) | INTRAMUSCULAR | Status: DC
  Administered 2021-10-26 – 2021-10-29 (×9): 5000 [IU] via SUBCUTANEOUS
  Filled 2021-10-26 (×9): qty 1

## 2021-10-26 MED ORDER — TAMSULOSIN HCL 0.4 MG PO CAPS
0.4000 mg | ORAL_CAPSULE | Freq: Every day | ORAL | Status: DC
  Administered 2021-10-26 – 2021-10-28 (×3): 0.4 mg via ORAL
  Filled 2021-10-26 (×4): qty 1

## 2021-10-26 MED ORDER — SODIUM BICARBONATE 650 MG PO TABS
650.0000 mg | ORAL_TABLET | Freq: Every day | ORAL | Status: DC
  Administered 2021-10-26 – 2021-10-28 (×3): 650 mg via ORAL
  Filled 2021-10-26 (×3): qty 1

## 2021-10-26 MED ORDER — LACTULOSE 10 GM/15ML PO SOLN
20.0000 g | Freq: Two times a day (BID) | ORAL | Status: DC
  Administered 2021-10-27 – 2021-10-29 (×5): 20 g via ORAL
  Filled 2021-10-26 (×5): qty 30

## 2021-10-26 MED ORDER — RENA-VITE PO TABS
1.0000 | ORAL_TABLET | Freq: Every day | ORAL | Status: DC
  Administered 2021-10-26 – 2021-10-28 (×3): 1 via ORAL
  Filled 2021-10-26 (×3): qty 1

## 2021-10-26 MED ORDER — PREDNISONE 20 MG PO TABS
40.0000 mg | ORAL_TABLET | Freq: Every day | ORAL | Status: DC
  Administered 2021-10-27 – 2021-10-28 (×2): 40 mg via ORAL
  Filled 2021-10-26 (×2): qty 2

## 2021-10-26 MED ORDER — ATORVASTATIN CALCIUM 20 MG PO TABS
20.0000 mg | ORAL_TABLET | Freq: Every evening | ORAL | Status: DC
  Administered 2021-10-26 – 2021-10-28 (×3): 20 mg via ORAL
  Filled 2021-10-26 (×3): qty 1

## 2021-10-26 MED ORDER — METOPROLOL SUCCINATE ER 25 MG PO TB24
25.0000 mg | ORAL_TABLET | Freq: Every day | ORAL | Status: DC
  Administered 2021-10-26 – 2021-10-29 (×3): 25 mg via ORAL
  Filled 2021-10-26 (×3): qty 1

## 2021-10-26 NOTE — Progress Notes (Signed)
Initial Nutrition Assessment  DOCUMENTATION CODES:   Non-severe (moderate) malnutrition in context of chronic illness  INTERVENTION:   Nepro Shake po TID, each supplement provides 425 kcal and 19 grams protein  Magic cup TID with meals, each supplement provides 290 kcal and 9 grams of protein  Rena-vit po daily   Liberalize diet   Pt at high refeed risk; recommend monitor potassium, magnesium and phosphorus labs daily until stable  NUTRITION DIAGNOSIS:   Moderate Malnutrition related to chronic illness (ESRD on HD, Cirrhosis) as evidenced by moderate muscle depletion, moderate fat depletion.  GOAL:   Patient will meet greater than or equal to 90% of their needs  MONITOR:   PO intake, Supplement acceptance, Labs, Weight trends, Skin, I & O's  REASON FOR ASSESSMENT:   Consult Assessment of nutrition requirement/status  ASSESSMENT:   80 y.o. male with medical history significant for DM, HTN, HLD, ESRD on HD TTS, cirrhosis, BPH, anemia of CKD, C-diff and GERD who is admitted with generalized weakness and left foot pain that prevents him from ambulating.  Met with pt in room today using interpreter. Pt reports poor appetite and oral intake after falling but reports that his oral intake is improved in hospital. Pt reports eating 100% of his breakfast and lunch today. Pt had an empty Nepro on his side table and reports that he drinks Nepro during dialysis but prefers berry flavors. RD discussed with pt the importance of adequate nutrition needed to preserve lean muscle and to replace losses from HD. Pt is willing to drink 2-3 Nepro per day. RD will add supplements and vitamins to help pt meet her estimated needs. Per chart, pt appears weight stable pta; pt reports that he may have lost a couple of pounds but nothing major.   Medications reviewed and include: allopurinol, heparin, insulin, lactulose, rena-vit, protonix, prednisone, Na bicarbonate   Labs reviewed: K 3.9 wnl, BUN  62(H), creat 7.26(H), ammonia 71(H) Hgb 8.5(L), Hct 26.0(L) Cbgs- 97, 102, 152 x 24hrs  AIC 4.8- 11/10  NUTRITION - FOCUSED PHYSICAL EXAM:  Flowsheet Row Most Recent Value  Orbital Region Moderate depletion  Upper Arm Region Moderate depletion  Thoracic and Lumbar Region Moderate depletion  Buccal Region Moderate depletion  Temple Region Moderate depletion  Clavicle Bone Region Moderate depletion  Clavicle and Acromion Bone Region Moderate depletion  Scapular Bone Region Moderate depletion  Dorsal Hand Moderate depletion  Patellar Region Severe depletion  Anterior Thigh Region Severe depletion  Posterior Calf Region Severe depletion  Edema (RD Assessment) Mild  Hair Reviewed  Eyes Reviewed  Mouth Reviewed  Skin Reviewed  Nails Reviewed   Diet Order:   Diet Order             Diet Carb Modified Fluid consistency: Thin; Room service appropriate? Yes  Diet effective now                  EDUCATION NEEDS:   Education needs have been addressed  Skin:  Skin Assessment: Reviewed RN Assessment  Last BM:  11/11- type 7  Height:   Ht Readings from Last 1 Encounters:  10/26/21 _0  (1.651 m)    Weight:   Wt Readings from Last 1 Encounters:  10/25/21 75 kg    Ideal Body Weight:  61.8 kg  BMI:  Body mass index is 27.51 kg/m.  Estimated Nutritional Needs:   Kcal:  1800-2100kcal/day  Protein:  90-105g/day  Fluid:  1.6-1.9L/day  Koleen Distance MS, RD, LDN Please refer to  [facility] for RD and/or RD on-call/weekend/after hours pager

## 2021-10-26 NOTE — Evaluation (Signed)
Occupational Therapy Evaluation Patient Details Name: Martin Gilbert MRN: [id number] DOB: 07-24-1941 Today's Date: 10/26/2021   History of Present Illness Martin Gilbert is a 80 y.o. 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  Patient states that he fell in his house and has been unable to bear weight.  Patient states that he has been unable to walk at all at home.  Patient is Spanish-speaking and has difficulty providing historical details even with a Patent attorney.   Clinical Impression   Pt seen for OT evaluation this date in setting of acute hospitalization with generalized weakness. Pt reports being able to perform all ADLs/IADLs at baseline, but per PT who called family member, pt requires some assist for ADLs from family and intermittently uses walker after dialysis if he's fatigued. Pt presents this date with decreased strength, balance, and functional activity tolerance. On ADL assessment this date, pt requires: SETUP for seated UB ADLs, MOD A for seated LB ADLs. MOD A for transfers with RW. Limited ability to take steps 2/2 weakness, noted that LEs are shaking with static standing. OT engages pt in commode transfer to [facility] (initially tried to encourage pt to take steps to commode in restroom, but he does not feel confident he can make it. Pt returned to bed end of session with all needs met and in reach. iPad interpreter utilized, [place], ID# [id number]. Pt is HOH and struggles to even communicate when interpreter is utilized as he cannot hear some questions/commands. Anticipate pt will require STR upon d/c as he is significantly weaker than his baseline.      Recommendations for follow up therapy are one component of a multi-disciplinary discharge planning process, led by the attending physician.   Recommendations may be updated based on patient status, additional functional criteria and insurance authorization.   Follow Up Recommendations  Skilled nursing-short term rehab (<3 hours/day)    Assistance Recommended at Discharge Frequent or constant Supervision/Assistance  Functional Status Assessment  Patient has had a recent decline in their functional status and demonstrates the ability to make significant improvements in function in a reasonable and predictable amount of time.  Equipment Recommendations  Other (comment) (defer to next level of care)    Recommendations for Other Services       Precautions / Restrictions Precautions Precautions: None Restrictions Weight Bearing Restrictions: No      Mobility Bed Mobility Overal bed mobility: Modified Independent             General bed mobility comments: increased time, HOB elevated, use of rails    Transfers Overall transfer level: Needs assistance Equipment used: Rolling walker (2 wheels) Transfers: Sit to/from Stand Sit to Stand: Mod assist           General transfer comment: Cuing for hand placement (benefits from tactile cues as he is HOH, including being able to hear interpreter iPad)      Balance Overall balance assessment: Needs assistance Sitting-balance support: No upper extremity supported;Feet supported Sitting balance-Leahy Scale: Good       Standing balance-Leahy Scale: Fair Standing balance comment: B UE support necessary for safety                           ADL either performed or assessed with clinical judgement   ADL Overall ADL's : Needs  assistance/impaired                                       General ADL Comments: SETUP for seated UB ADLs, MOD A for seated LB ADLs. MOD A for transfers with RW. Limited ability to take steps 2/2 weakness, noted that LEs are shaking with static standing.     Vision   Additional Comments: pt tracks appropriately      Perception     Praxis      Pertinent Vitals/Pain Pain Assessment: 0-10 Pain Score: 8  Pain Location: L ankle Pain Descriptors / Indicators: Aching;Dull;Grimacing;Guarding Pain Intervention(s): Limited activity within patient's tolerance;Monitored during session;Premedicated before session;Repositioned     Hand Dominance Right   Extremity/Trunk Assessment Upper Extremity Assessment Upper Extremity Assessment: Generalized weakness   Lower Extremity Assessment Lower Extremity Assessment: Generalized weakness;LLE deficits/detail LLE Deficits / Details: Swelling in ankle       Communication Communication Communication: Interpreter [place] (interpreter iPad, [place] ID# [id number])   Cognition Arousal/Alertness: Awake/alert Behavior During Therapy: WFL for tasks assessed/performed Overall Cognitive Status: Within Functional Limits for tasks assessed                                       General Comments       Exercises Other Exercises Other Exercises: OT engages pt in ed re: role of OT in acute setting, importance of OOB activity, importance of nutrition and how it impacts his mobility and strength.   Shoulder Instructions      Home Living Family/patient expects to be discharged to:: Private residence Living Arrangements: Spouse/significant other Available Help at Discharge: Family;Available PRN/intermittently (other family only available in evenings, they work during the day. Pt and spouse with low vision alone all day.) Type of Home: House Home Access: Level entry     Home Layout: One level     Bathroom Shower/Tub: Teacher, early years/pre: Standard Bathroom Accessibility: Yes   Home Equipment: Rollator (4 wheels);BSC/3in1;Shower seat;Wheelchair - manual   Additional Comments: Interpreter, EMR, and daughter assist in PLOF, DME, home layout      Prior Functioning/Environment Prior Level of Function : Needs assist       Physical  Assist : Mobility (physical);ADLs (physical) Mobility (physical): Gait   Mobility Comments: Intermittent use of RW after dialysis ADLs Comments: pt reports being able to perform ADLs/IADLs I'ly. Family (to PT via telephone) report they assist him with all IADLs and some self care.        OT Problem List: Decreased strength;Decreased activity tolerance;Impaired balance (sitting and/or standing);Decreased knowledge of use of DME or AE;Pain;Increased edema      OT Treatment/Interventions: Self-care/ADL training;Therapeutic exercise;DME and/or AE instruction;Therapeutic activities;Patient/family education;Balance training    OT Goals(Current goals can be found in the care plan section) Acute Rehab OT Goals Patient Stated Goal: to get stronger OT Goal Formulation: With patient Time For Goal Achievement: 11/09/21 Potential to Achieve Goals: Good ADL Goals Pt Will Perform Lower Body Dressing: with min guard assist;sit to/from stand;with adaptive equipment Pt Will Transfer to Toilet: with min guard assist;with min assist;ambulating;bedside commode (LRAD to/from restroom with BSC over toilet to elevate, use of grab bars, ~15' to increase tolerance for fxl HH distances) Pt Will Perform Toileting - Clothing Manipulation and hygiene: with min guard assist;sitting/lateral leans Pt Will  Perform Tub/Shower Transfer: with min guard assist;rolling walker;ambulating;shower seat;grab bars Pt/caregiver will Perform Home Exercise Program: Increased strength;Both right and left upper extremity;With Supervision  OT Frequency: Min 2X/week   Barriers to D/C:            Co-evaluation              AM-PAC OT "6 Clicks" Daily Activity     Outcome Measure Help from another person eating meals?: None Help from another person taking care of personal grooming?: A Little Help from another person toileting, which includes using toliet, bedpan, or urinal?: A Lot Help from another person bathing (including  washing, rinsing, drying)?: A Lot Help from another person to put on and taking off regular upper body clothing?: A Little Help from another person to put on and taking off regular lower body clothing?: A Lot 6 Click Score: 16   End of Session Equipment Utilized During Treatment: Gait belt;Rolling walker (2 wheels) Nurse Communication: Mobility status;Other (comment) (decreased oral intake which pt reports is 2/2 his teeth being broken and not able to chew)  Activity Tolerance: Patient tolerated treatment well Patient left: in bed;with call bell/phone within reach;with bed alarm set  OT Visit Diagnosis: Unsteadiness on feet (R26.81);History of falling (Z91.81)                Time: 3710-6269 OT Time Calculation (min): 28 min Charges:  OT General Charges $OT Visit: 1 Visit OT Evaluation $OT Eval Moderate Complexity: 1 Mod OT Treatments $Self Care/Home Management : 8-22 mins  Gerrianne Scale, MS, OTR/L ascom [phone] 10/26/21, 5:39 PM

## 2021-10-26 NOTE — Progress Notes (Signed)
PROGRESS NOTE    Martin Gilbert  SJG:[id number] DOB: [birth year] DOA: 10/25/2021 PCP: [name], MD  Outpatient Specialists: nephrology    Brief Narrative:   Martin Gilbert is a 80 y.o. male with medical history significant for DM, HTN, ESRD on HD TTS, anemia of CKD, hepatic cirrhosis, who was sent to the ED from dialysis because of generalized weakness and left foot pain that prevents him from ambulating.  Patient fell a week ago injuring his left ankle and has been unable to ambulate on the foot since.  Patient at baseline ambulates independently and takes care of his wife and the home.  He states he overall feels very weak and has no appetite.  He denies chest pain, shortness of breath, fever or chills.  Denies nausea or vomiting and denies diarrhea.  States his biggest problem is the pain in his left foot and ankle and the weakness he feels.  Patient states he has not been taking his medication due to his poor oral intake.   Assessment & Plan:   Principal Problem:   Generalized weakness Active Problems:   Hypertension   ESRD on hemodialysis ([name])   Left foot pain   Anemia of chronic kidney failure, stage 5 ([name])   Diabetes mellitus, type II ([place])   History of recent fall   Hyperammonemia ([name])   Hepatic cirrhosis ([name])   Inadequate oral intake  # Left ankle pain Daughter reports trip and fall a week ago and since then complaining of left ankle pain and difficulty walking. There is mild diffuse right ankle swelling and the ankle is warm. Most tender at achilles insertion site. X-ray without fracture. Patient denies history of gout BUT the patient is on allopurinol. PVL neg for dvt. Ddx ankle sprain vs gout. Low suspicion for septic joint - podiatry to see - will start prednisone for possible gout flare - pt/ot consults - possible SNF placement  # Lethargy and confusion Daughter reports some recent lethargy and confusion. She reports patient  self-discontinued lactulose so hepatic encephalopathy the most likely acute culprit, though patient with significant chronic medical conditions that no doubt contribute. - cirrhosis as below  # ESRD On tts dialysis, sent here from dialysis yesterday. No electrolyte disturbances or significant fluid overload noted - nephrology notified of patient's presence.  # Cirrhosis Appears compensated. No documented hx of varices; I do not see report in our records or care record of endoscopy. Mild hepatic encephalopathy as above - continue lactulose, titrate as needed  # HTN Here bp wnl - home hydral, metop  # Anemia on ckd Hgb stable in 8s - monitor, likely epo w/ dialysis  # T2DM Sugars wnl, not on meds - SSI while on steroids  # BPH - cont flomax  DVT prophylaxis: heparin Code Status: full Family Communication: daughter updated telephonically 11/11  Level of care: Med-Surg Status is: Inpatient  Remains inpatient appropriate because: unsafe d/c plan, ongoing w/u        Consultants:  Podiatry, nephrology  Procedures: none  Antimicrobials:  none    Subjective: This morning complains of ongoing left ankle pain. No other complaints  Objective: Vitals:   10/25/21 2345 10/26/21 0106 10/26/21 0507 10/26/21 0743  BP: (!) 149/49 (!) 147/56 (!) 129/48 (!) 120/36  Pulse: 73 74 66 63  Resp: 20 19 16 16   Temp: 98.5 F (36.9 C) 99.6 F (37.6 C) 99.6 F (37.6 C) 99.2 F (37.3 C)  TempSrc: Oral Oral Oral Oral  SpO2: 99% 99%  97% 96%  Weight:       No intake or output data in the 24 hours ending 10/26/21 0955 Filed Weights   10/25/21 1943 10/25/21 1944  Weight: 72 kg 75 kg    Examination:  General exam: Appears calm and comfortable  Respiratory system: Clear to auscultation. Respiratory effort normal. Cardiovascular system: S1 & S2 heard, RRR. No JVD, murmurs, rubs, gallops or clicks. No pedal edema. Gastrointestinal system: Abdomen is mildly distended, soft and  nontender. No organomegaly or masses felt. Normal bowel sounds heard. Central nervous system: Alert and oriented. No focal neurological deficits. Extremities: Symmetric 5 x 5 power. Skin: No rashes, lesions or ulcers Psychiatry: Judgement and insight appear normal. Mood & affect appropriate.  Msk: mild diffuse left joint swelling with mild warmth. Ttp achilles insertion and with movement.     Data Reviewed: I have personally reviewed following labs and imaging studies  CBC: Recent Labs  Lab 10/25/21 2015 10/26/21 0436  WBC 8.1 7.5  NEUTROABS 6.0  --   HGB 8.8* 8.5*  HCT 27.3* 26.0*  MCV 100.0 100.4*  PLT 151 941   Basic Metabolic Panel: Recent Labs  Lab 10/25/21 2015 10/26/21 0436  NA 135 137  K 3.7 3.9  CL 99 100  CO2 25 25  GLUCOSE 118* 99  BUN 60* 62*  CREATININE 6.27* 7.26*  CALCIUM 8.3* 8.2*   GFR: Estimated Creatinine Clearance: 7.6 mL/min (A) (by C-G formula based on SCr of 7.26 mg/dL (H)). Liver Function Tests: Recent Labs  Lab 10/25/21 2015 10/26/21 0436  AST 52* 41  ALT 27 26  ALKPHOS 327* 298*  BILITOT 1.2 1.1  PROT 6.7 6.5  ALBUMIN 2.9* 2.6*   No results for input(s): LIPASE, AMYLASE in the last 168 hours. Recent Labs  Lab 10/25/21 2213 10/26/21 0436  AMMONIA 50* 71*   Coagulation Profile: Recent Labs  Lab 10/25/21 2016  INR 1.2   Cardiac Enzymes: Recent Labs  Lab 10/25/21 2015  CKTOTAL 86   BNP (last 3 results) No results for input(s): PROBNP in the last 8760 hours. HbA1C: No results for input(s): HGBA1C in the last 72 hours. CBG: Recent Labs  Lab 10/26/21 0136 10/26/21 0743  GLUCAP 97 102*   Lipid Profile: No results for input(s): CHOL, HDL, LDLCALC, TRIG, CHOLHDL, LDLDIRECT in the last 72 hours. Thyroid Function Tests: No results for input(s): TSH, T4TOTAL, FREET4, T3FREE, THYROIDAB in the last 72 hours. Anemia Panel: No results for input(s): VITAMINB12, FOLATE, FERRITIN, TIBC, IRON, RETICCTPCT in the last 72  hours. Urine analysis:    Component Value Date/Time   COLORURINE RED (A) 07/20/2020 1851   APPEARANCEUR TURBID (A) 07/20/2020 1851   APPEARANCEUR Turbid 08/29/2013 2049   LABSPEC 1.020 07/20/2020 1851   LABSPEC 1.015 08/29/2013 2049   PHURINE  07/20/2020 1851    TEST NOT REPORTED DUE TO COLOR INTERFERENCE OF URINE PIGMENT   GLUCOSEU (A) 07/20/2020 1851    TEST NOT REPORTED DUE TO COLOR INTERFERENCE OF URINE PIGMENT   GLUCOSEU 50 mg/dL 08/29/2013 2049   HGBUR (A) 07/20/2020 1851    TEST NOT REPORTED DUE TO COLOR INTERFERENCE OF URINE PIGMENT   BILIRUBINUR (A) 07/20/2020 1851    TEST NOT REPORTED DUE TO COLOR INTERFERENCE OF URINE PIGMENT   BILIRUBINUR Negative 08/29/2013 2049   KETONESUR (A) 07/20/2020 1851    TEST NOT REPORTED DUE TO COLOR INTERFERENCE OF URINE PIGMENT   PROTEINUR (A) 07/20/2020 1851    TEST NOT REPORTED DUE TO COLOR INTERFERENCE OF  URINE PIGMENT   NITRITE (A) 07/20/2020 1851    TEST NOT REPORTED DUE TO COLOR INTERFERENCE OF URINE PIGMENT   LEUKOCYTESUR (A) 07/20/2020 1851    TEST NOT REPORTED DUE TO COLOR INTERFERENCE OF URINE PIGMENT   LEUKOCYTESUR 3+ 08/29/2013 2049   Sepsis Labs: @LABRCNTIP (procalcitonin:4,lacticidven:4)  ) Recent Results (from the past 240 hour(s))  Resp Panel by RT-PCR (Flu A&B, Covid) Nasopharyngeal Swab     Status: None   Collection Time: 10/25/21  8:15 PM   Specimen: Nasopharyngeal Swab; Nasopharyngeal(NP) swabs in vial transport medium  Result Value Ref Range Status   SARS Coronavirus 2 by RT PCR NEGATIVE NEGATIVE Final    Comment: (NOTE) SARS-CoV-2 target nucleic acids are NOT DETECTED.  The SARS-CoV-2 RNA is generally detectable in upper respiratory specimens during the acute phase of infection. The lowest concentration of SARS-CoV-2 viral copies this assay can detect is 138 copies/mL. A negative result does not preclude SARS-Cov-2 infection and should not be used as the sole basis for treatment or other patient management  decisions. A negative result may occur with  improper specimen collection/handling, submission of specimen other than nasopharyngeal swab, presence of viral mutation(s) within the areas targeted by this assay, and inadequate number of viral copies(<138 copies/mL). A negative result must be combined with clinical observations, patient history, and epidemiological information. The expected result is Negative.  Fact Sheet for Patients:  EntrepreneurPulse.com.au  Fact Sheet for Healthcare Providers:  IncredibleEmployment.be  This test is no t yet approved or cleared by the [place] FDA and  has been authorized for detection and/or diagnosis of SARS-CoV-2 by FDA under an Emergency Use Authorization (EUA). This EUA will remain  in effect (meaning this test can be used) for the duration of the COVID-19 declaration under Section 564(b)(1) of the Act, 21 U.S.C.section 360bbb-3(b)(1), unless the authorization is terminated  or revoked sooner.       Influenza A by PCR NEGATIVE NEGATIVE Final   Influenza B by PCR NEGATIVE NEGATIVE Final    Comment: (NOTE) The Xpert Xpress SARS-CoV-2/FLU/RSV plus assay is intended as an aid in the diagnosis of influenza from Nasopharyngeal swab specimens and should not be used as a sole basis for treatment. Nasal washings and aspirates are unacceptable for Xpert Xpress SARS-CoV-2/FLU/RSV testing.  Fact Sheet for Patients: EntrepreneurPulse.com.au  Fact Sheet for Healthcare Providers: IncredibleEmployment.be  This test is not yet approved or cleared by the [place] FDA and has been authorized for detection and/or diagnosis of SARS-CoV-2 by FDA under an Emergency Use Authorization (EUA). This EUA will remain in effect (meaning this test can be used) for the duration of the COVID-19 declaration under Section 564(b)(1) of the Act, 21 U.S.C. section 360bbb-3(b)(1), unless the  authorization is terminated or revoked.  Performed at [facility], [address] [place], [place] [address]          Radiology Studies: DG Ankle Complete Left  Result Date: 10/25/2021 CLINICAL DATA:  Status post fall EXAM: LEFT ANKLE COMPLETE - 3+ VIEW COMPARISON:  None. FINDINGS: Diffusely decreased bone density. There is no evidence of fracture, dislocation, or joint effusion. There is no evidence of arthropathy or other focal bone abnormality. Diffuse subcutaneus soft tissue edema. Vascular calcifications. IMPRESSION: 1. No acute displaced fracture or dislocation. 2. Diffusely decreased bone density. Electronically Signed   By: Iven Finn M.D.   On: 10/25/2021 20:33   US Venous Img Lower Unilateral Left  Result Date: 10/25/2021 CLINICAL DATA:  Pain and swelling EXAM: Left  LOWER EXTREMITY VENOUS DOPPLER ULTRASOUND TECHNIQUE: Gray-scale sonography with compression, as well as color and duplex ultrasound, were performed to evaluate the deep venous system(s) from the level of the common femoral vein through the popliteal and proximal calf veins. COMPARISON:  None. FINDINGS: VENOUS Normal compressibility of the common femoral, superficial femoral, and popliteal veins, as well as the visualized calf veins. Visualized portions of profunda femoral vein and great saphenous vein unremarkable. No filling defects to suggest DVT on grayscale or color Doppler imaging. Doppler waveforms show normal direction of venous flow, normal respiratory plasticity and response to augmentation. Limited views of the contralateral common femoral vein are unremarkable. OTHER None. Limitations: none IMPRESSION: No deep venous thrombosis left lower extremity. Electronically Signed   By: Iven Finn M.D.   On: 10/25/2021 21:33        Scheduled Meds:  heparin injection (subcutaneous)  5,000 Units Subcutaneous Q8H   insulin aspart  0-15 Units Subcutaneous TID WC   insulin aspart  0-5 Units  Subcutaneous QHS   lactulose  30 g Oral TID   Continuous Infusions:   LOS: 1 day    Time spent: 73 min    Desma Maxim, MD [facility]   If 7PM-7AM, please contact night-coverage [email or web address] Password [email or web address] 10/26/2021, 9:55 AM

## 2021-10-26 NOTE — TOC Initial Note (Signed)
Transition of Care [facility]) - Initial/Assessment Note    Patient Details  Name: Martin Gilbert MRN: [id number] Date of Birth: 10/26/1941  Transition of [facility]) CM/SW Contact:    Candie Chroman, LCSW Phone Number: 10/26/2021, 2:46 PM  Clinical Narrative:  Received call back from daughter. CSW introduced role and explained that PT recommendations would be discussed. Daughter is agreeable to SNF. First preference is [facility] because patient has been there in the past. Admissions coordinator is aware and will review. Daughter confirmed he gets HD at [facility] in [place] to reach HD coordinator or anyone at HD center to confirm chair time. Daughter said he gets picked up for transport around 5:00 am. Patient will need to be here until at least Sunday to meet 3-night inpatient stay. MD said he meets medical necessity for this. No further concerns. CSW encouraged patient's daughter to contact CSW as needed. CSW will continue to follow patient and his daughter for support and facilitate discharge to SNF once medically stable.              Expected Discharge Plan: Skilled Nursing Facility Barriers to Discharge: Continued Medical Work up   Patient Goals and CMS Choice        Expected Discharge Plan and Services Expected Discharge Plan: [place] Acute Care Choice: [place] Living arrangements for the past 2 months: Single Family Home                                      Prior Living Arrangements/Services Living arrangements for the past 2 months: Single Family Home Lives with:: Spouse, Adult Children Patient language and need for interpreter reviewed:: Yes Do you feel safe going back to the place where you live?: Yes      Need for Family Participation in Patient Care: Yes (Comment) Care giver support system in place?: Yes (comment)   Criminal Activity/Legal Involvement Pertinent to Current  Situation/Hospitalization: No - Comment as needed  Activities of Daily Living Home Assistive Devices/Equipment: None ADL Screening (condition at time of admission) Patient's cognitive ability adequate to safely complete daily activities?: Yes Is the patient deaf or have difficulty hearing?: No Does the patient have difficulty seeing, even when wearing glasses/contacts?: Yes Does the patient have difficulty concentrating, remembering, or making decisions?: Yes Patient able to express need for assistance with ADLs?: Yes Does the patient have difficulty dressing or bathing?: No Independently performs ADLs?: No Communication: Independent Dressing (OT): Independent Grooming: Independent Feeding: Independent Bathing: Independent Toileting: Needs assistance Is this a change from baseline?: Change from baseline, expected to last <3 days In/Out Bed: Needs assistance Is this a change from baseline?: Change from baseline, expected to last <3 days Walks in Home: Needs assistance Is this a change from baseline?: Change from baseline, expected to last <3 days Does the patient have difficulty walking or climbing stairs?: Yes Weakness of Legs: Left Weakness of Arms/Hands: None  Permission Sought/Granted Permission sought to share information with : Facility Sport and exercise psychologist, Family Supports    Share Information with NAME: Rosemarie Ax  Permission granted to share info w AGENCY: SNF's  Permission granted to share info w Relationship: Daughter  Permission granted to share info w Contact Information: [phone]  Emotional Assessment Appearance:: Appears stated age Attitude/Demeanor/Rapport: Unable to Assess Affect (typically observed): Unable to Assess Orientation: : Oriented to Self, Oriented to Place,  Oriented to Situation Alcohol / Substance Use: Other (comment) Psych Involvement: No (comment)  Admission diagnosis:  Weakness [R53.1] Generalized weakness [R53.1] Patient Active  Problem List   Diagnosis Date Noted   Anemia of chronic kidney failure, stage 5 ([place]) 10/25/2021   Diabetes mellitus, type II ([place]) 10/25/2021   History of recent fall 10/25/2021   Hyperammonemia ([place]) 10/25/2021   Hepatic cirrhosis ([place]) 10/25/2021   Inadequate oral intake 10/25/2021   Norovirus 04/15/2021   Hypotension    Dehydration    Hypomagnesemia    Acute colitis 24/17/5301   Acute metabolic encephalopathy 03/19/5912   Acute gastroenteritis 10/11/2018   Intractable nausea and vomiting 10/10/2018   Sacral fracture ([place]) 68/59/9234   Complication of arteriovenous dialysis fistula 09/01/2017   ESRD on hemodialysis ([place]) 06/03/2017   Colitis 03/27/2017   Demand ischemia ([place]) 03/27/2017   Enteritis due to Clostridium difficile    Protein-calorie malnutrition, severe 02/18/2017   C. difficile colitis 02/15/2017   UTI (urinary tract infection) 02/02/2017   E coli infection 02/02/2017   Lactic acidosis 02/02/2017   Diabetic neuropathy ([place]) 02/02/2017   Left-sided chest wall pain 02/02/2017   Fall 02/02/2017   Generalized weakness 02/02/2017   Pink eye, left 02/02/2017   Leukocytosis 02/02/2017   Anemia of chronic disease 02/02/2017   Nausea and vomiting 02/02/2017   Sepsis ([place]) 01/31/2017   Left foot pain 10/08/2016   Osteoarthritis of knee 10/08/2016   Hypertension 11/28/2015   Chronic gout due to renal impairment, right ankle and foot, without tophus (tophi) 07/31/2015   Bone lesion 06/05/2015   Primary osteoarthritis of both knees 06/05/2015   Pain in the chest 02/15/2015   Neck pain 02/15/2015   Diabetes type 2, uncontrolled 02/15/2015   Frequent headaches 02/15/2015   Type 2 diabetes mellitus with hyperglycemia ([place]) 02/15/2015   Hypertrophy of prostate with urinary obstruction and other lower urinary tract symptoms (LUTS) 09/07/2013   PCP:  [name], MD Pharmacy:   [facility], [address] [place] [address] Phone: [phone] Fax: [phone]     Social Determinants of Health (SDOH) Interventions    Readmission Risk Interventions Readmission Risk Prevention Plan 04/16/2021  Transportation Screening Complete  Home Care Screening Complete  Medication Review (RN CM) Complete  Some recent data might be hidden

## 2021-10-26 NOTE — NC FL2 (Signed)
[place] LEVEL OF CARE SCREENING TOOL     IDENTIFICATION  Patient Name: Martin Gilbert Birthdate: [birth year] Sex: male Admission Date (Current Location): 10/25/2021  [facility] and [place] Number:  Engineering geologist and Address:  [facility], [address], [place], [place] [address]      Provider Number: [id number]  Attending Physician Name and Address:  Gwynne Edinger, MD  Relative Name and Phone Number:       Current Level of Care: Hospital Recommended Level of Care: [place] Prior Approval Number:    Date Approved/Denied:   PASRR Number: [id number]  Discharge Plan: SNF    Current Diagnoses: Patient Active Problem List   Diagnosis Date Noted   Anemia of chronic kidney failure, stage 5 ([place]) 10/25/2021   Diabetes mellitus, type II ([place]) 10/25/2021   History of recent fall 10/25/2021   Hyperammonemia ([place]) 10/25/2021   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 difficile colitis 02/15/2017   UTI (urinary tract infection) 02/02/2017   E coli infection 02/02/2017   Lactic acidosis 02/02/2017   Diabetic neuropathy ([place]) 02/02/2017   Left-sided chest wall pain 02/02/2017   Fall 02/02/2017   Generalized weakness 02/02/2017   Pink eye, left 02/02/2017   Leukocytosis 02/02/2017   Anemia of chronic disease 02/02/2017   Nausea and vomiting 02/02/2017   Sepsis  ([place]) 01/31/2017   Left foot pain 10/08/2016   Osteoarthritis of knee 10/08/2016   Hypertension 11/28/2015   Chronic gout due to renal impairment, right ankle and foot, without tophus (tophi) 07/31/2015   Bone lesion 06/05/2015   Primary osteoarthritis of both knees 06/05/2015   Pain in the chest 02/15/2015   Neck pain 02/15/2015   Diabetes type 2, uncontrolled 02/15/2015   Frequent headaches 02/15/2015   Type 2 diabetes mellitus with hyperglycemia ([place]) 02/15/2015   Hypertrophy of prostate with urinary obstruction and other lower urinary tract symptoms (LUTS) 09/07/2013    Orientation RESPIRATION BLADDER Height & Weight     Self, Situation, Place  Normal Continent Weight: 165 lb 5.5 oz (75 kg) Height:  5\' 5"  (165.1 cm)  BEHAVIORAL SYMPTOMS/MOOD NEUROLOGICAL BOWEL NUTRITION STATUS   (None)  (None) Continent Diet (Carb modified. Low Sodium- No salt packs on trays.)  AMBULATORY STATUS COMMUNICATION OF NEEDS Skin   Limited Assist Verbally Normal                       Personal Care Assistance Level of Assistance  Bathing, Feeding, Dressing Bathing Assistance: Limited assistance Feeding assistance: Limited assistance Dressing Assistance: Limited assistance     Functional Limitations Info  Sight, Hearing, Speech Sight Info: Adequate Hearing Info: Adequate Speech Info: Adequate    SPECIAL CARE FACTORS FREQUENCY  PT (By licensed PT), OT (By licensed OT)     PT Frequency: 5 x week OT Frequency: 5 x week            Contractures Contractures Info: Not  present    Additional Factors Info  Code Status, Allergies Code Status Info: Full code Allergies Info: NKDA           Current Medications (10/26/2021):  This is the current hospital active medication list Current Facility-Administered Medications  Medication Dose Route Frequency Provider Last Rate Last Admin   acetaminophen (TYLENOL) tablet 650 mg  650 mg Oral Q6H PRN Athena Masse, MD   650 mg at 10/26/21  0854   Or   acetaminophen (TYLENOL) suppository 650 mg  650 mg Rectal Q6H PRN Athena Masse, MD       allopurinol (ZYLOPRIM) tablet 100 mg  100 mg Oral Daily Gwynne Edinger, MD   100 mg at 10/26/21 1224   atorvastatin (LIPITOR) tablet 20 mg  20 mg Oral QPM Wouk, Ailene Rud, MD       feeding supplement (NEPRO CARB STEADY) liquid 237 mL  237 mL Oral BID BM Wouk, Ailene Rud, MD       heparin injection 5,000 Units  5,000 Units Subcutaneous Q8H Athena Masse, MD   5,000 Units at 10/26/21 8032   hydrALAZINE (APRESOLINE) tablet 50 mg  50 mg Oral TID Gwynne Edinger, MD       HYDROcodone-acetaminophen (NORCO/VICODIN) 5-325 MG per tablet 1 tablet  1 tablet Oral Q6H PRN Athena Masse, MD   1 tablet at 10/26/21 0221   insulin aspart (novoLOG) injection 0-15 Units  0-15 Units Subcutaneous TID WC Judd Gaudier V, MD   2 Units at 10/26/21 1224   insulin aspart (novoLOG) injection 0-5 Units  0-5 Units Subcutaneous QHS Athena Masse, MD       lactulose (CHRONULAC) 10 GM/15ML solution 20 g  20 g Oral BID Wouk, Ailene Rud, MD       metoprolol succinate (TOPROL-XL) 24 hr tablet 25 mg  25 mg Oral Daily Gwynne Edinger, MD   25 mg at 10/26/21 1224   multivitamin (RENA-VIT) tablet 1 tablet  1 tablet Oral QHS Wouk, Ailene Rud, MD       ondansetron [facility]) tablet 4 mg  4 mg Oral Q6H PRN Athena Masse, MD       Or   ondansetron [facility]) injection 4 mg  4 mg Intravenous Q6H PRN Athena Masse, MD       pantoprazole (PROTONIX) EC tablet 40 mg  40 mg Oral Daily Gwynne Edinger, MD   40 mg at 10/26/21 1224   [START ON 10/27/2021] predniSONE (DELTASONE) tablet 40 mg  40 mg Oral Q breakfast Wouk, Ailene Rud, MD       sodium bicarbonate tablet 650 mg  650 mg Oral Daily Gwynne Edinger, MD   650 mg at 10/26/21 1224   tamsulosin (FLOMAX) capsule 0.4 mg  0.4 mg Oral QPC supper Wouk, Ailene Rud, MD         Discharge Medications: Please see discharge summary for a list of discharge  medications.  Relevant Imaging Results:  Relevant Lab Results:   Additional Information SS#: [id number]. Spanish-speaking. HD TTS [facility]  in [place] of time. Daughter said he gets picked up around 5:00 am. Need to confirm time with HD coordinator/clinic.  Candie Chroman, LCSW

## 2021-10-26 NOTE — Evaluation (Signed)
Physical Therapy Evaluation Patient Details Name: Martin Gilbert MRN: [id number] DOB: 1941-01-10 Today's Date: 10/26/2021  History of Present Illness  Martin Gilbert is a 80 y.o. 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  Patient states that he fell in his house and has been unable to bear weight.  Patient states that he has been unable to walk at all at home.  Patient is Spanish-speaking and has difficulty providing historical details even with a Patent attorney.   Clinical Impression  Pt admitted with above diagnosis. Pt received supine in bed agreeable to PT. Relies on translator to communicate effectively. Phone call post session to pt daughter due to difficulty with pt being Ho[facility] talking to translator to ensure accurate info on DME, home lay out, PLOF, assist available at home. Per daughter pt is mod-I for household and short community ADL's with no AD and intermittent use of RW after dialysis due to weakness. Does have family stay with pt and his wife every evening with intermittent family support during day as needed. Able to don R sock with no difficulty but relies on PT totally for donning sock on L foot. Pt is mod-I to transfer supine to sitting EOB. Pt does require bed elevated and modA to stand to RW and cuing for safe hand placement. Pt endorses worsening of pain and despite encouragement is only willing to amb 2' to recliner with minguard for safety. Pt antalgic on LLE with increased weight shift on RLE > LLE. Decreased eccentric control with sitting and heavy reliance on UE's to assist descent. Due to limited family support at home (wife is blind and is unable to assist as needed) and inability to amb household distance, pt will benefit from STR to optimize safe mobility with transfers and ambulation prior  to returning to home environment.      Recommendations for follow up therapy are one component of a multi-disciplinary discharge planning process, led by the attending physician.  Recommendations may be updated based on patient status, additional functional criteria and insurance authorization.  Follow Up Recommendations Skilled nursing-short term rehab (<3 hours/day)    Assistance Recommended at Discharge Intermittent Supervision/Assistance  Functional Status Assessment Patient has had a recent decline in their functional status and demonstrates the ability to make significant improvements in function in a reasonable and predictable amount of time.  Equipment Recommendations  Other (comment) (next venue of care)    Recommendations for Other Services       Precautions / Restrictions Precautions Precautions: None Restrictions Weight Bearing Restrictions: No      Mobility  Bed Mobility Overal bed mobility: Modified Independent               Patient Response: Cooperative  Transfers Overall transfer level: Needs assistance Equipment used: Rolling walker (2 wheels) Transfers: Sit to/from Stand Sit to Stand: Mod assist           General transfer comment: Cuing for hand placement    Ambulation/Gait Ambulation/Gait assistance: Min guard Gait Distance (Feet): 2 Feet Assistive device: Rolling walker (2 wheels) Gait Pattern/deviations: Step-to pattern;Decreased stance time - left;Trunk flexed;Narrow base of support       General Gait Details: Antalgic on L. Refuses to progress amb in room due to pain in L ankle.  Stairs            Emergency planning/management officer  Modified Rankin (Stroke Patients Only)       Balance Overall balance assessment: Needs assistance Sitting-balance support: No upper extremity supported;Feet supported Sitting balance-Leahy Scale: Good       Standing balance-Leahy Scale: Fair Standing balance comment: Reliant on RW for support                              Pertinent Vitals/Pain Pain Assessment: 0-10 Pain Score: 10-Worst pain ever Pain Location: L ankle Pain Descriptors / Indicators: Aching;Dull;Grimacing;Guarding Pain Intervention(s): Monitored during session;Repositioned;Limited activity within patient's tolerance    Home Living Family/patient expects to be discharged to:: Private residence Living Arrangements: Spouse/significant other Available Help at Discharge: Family;Available PRN/intermittently (only in evenings) Type of Home: House Home Access: Level entry       Home Layout: One level Home Equipment: Rollator (4 wheels);BSC/3in1;Shower seat;Wheelchair - manual Additional Comments: Interpreter, EMR, and daughter assist in PLOF, DME, home layout    Prior Function Prior Level of Function : Needs assist       Physical Assist : Mobility (physical) Mobility (physical): Gait;Transfers   Mobility Comments: Intermittent use of RW after dialysis ADLs Comments: Family assists in ADL's.     Hand Dominance   Dominant Hand: Right    Extremity/Trunk Assessment   Upper Extremity Assessment Upper Extremity Assessment: Defer to OT evaluation    Lower Extremity Assessment Lower Extremity Assessment: Generalized weakness;LLE deficits/detail LLE Deficits / Details: Swelling in ankle    Cervical / Trunk Assessment Cervical / Trunk Assessment: Normal  Communication   Communication: Interpreter utilized;HOH (Hears best in R ear)  Cognition Arousal/Alertness: Awake/alert Behavior During Therapy: WFL for tasks assessed/performed Overall Cognitive Status: Within Functional Limits for tasks assessed                                          General Comments      Exercises Other Exercises Other Exercises: Role of PT in acute setting, use of modalities for pain and swelling management, elevating limb   Assessment/Plan    PT Assessment Patient needs continued PT services   PT Problem List Decreased strength;Pain;Decreased activity tolerance;Decreased balance;Decreased mobility       PT Treatment Interventions DME instruction;Therapeutic exercise;Gait training;Balance training;Stair training;Modalities;Neuromuscular re-education;Functional mobility training;Therapeutic activities;Patient/family education    PT Goals (Current goals can be found in the Care Plan section)  Acute Rehab PT Goals Patient Stated Goal: improve pain PT Goal Formulation: With patient Time For Goal Achievement: 11/09/21 Potential to Achieve Goals: Fair    Frequency Min 2X/week   Barriers to discharge Decreased caregiver support      Co-evaluation               AM-PAC PT "6 Clicks" Mobility  Outcome Measure Help needed turning from your back to your side while in a flat bed without using bedrails?: None Help needed moving from lying on your back to sitting on the side of a flat bed without using bedrails?: A Little Help needed moving to and from a bed to a chair (including a wheelchair)?: A Lot Help needed standing up from a chair using your arms (e.g., wheelchair or bedside chair)?: A Lot Help needed to walk in hospital room?: A Lot Help needed climbing 3-5 steps with a railing? : A Lot 6 Click Score: 15    End of Session Equipment Utilized  During Treatment: Gait belt Activity Tolerance: Patient limited by pain Patient left: in chair;with call bell/phone within reach;with chair alarm set Nurse Communication: Mobility status PT Visit Diagnosis: Other abnormalities of gait and mobility (R26.89);Muscle weakness (generalized) (M62.81);Pain Pain - Right/Left: Left Pain - part of body: Ankle and joints of foot    Time: 8828-0034 PT Time Calculation (min) (ACUTE ONLY): 32 min   Charges:   PT Evaluation $PT Eval Moderate Complexity: 1 Mod PT Treatments $Therapeutic Activity: 23-37 mins       Mikelle Myrick M. Fairly IV, PT, DPT Physical Therapist- [facility]  10/26/2021, 11:48 AM

## 2021-10-26 NOTE — Progress Notes (Signed)
[facility]  ROUNDING NOTE   Subjective:  Patient well-known to [place] as we follow him for outpatient hemodialysis. Came in with weakness and left foot pain that he developed after fall. Left ankle x-ray was negative for acute displaced fracture or dislocation. He did undergo hemodialysis treatment yesterday.   Objective:  Vital signs in last 24 hours:  Temp:  [98.5 F (36.9 C)-99.6 F (37.6 C)] 99.2 F (37.3 C) (11/11 0743) Pulse Rate:  [63-74] 63 (11/11 0743) Resp:  [16-20] 16 (11/11 0743) BP: (120-149)/(36-56) 120/36 (11/11 0743) SpO2:  [94 %-99 %] 96 % (11/11 0743) Weight:  [72 kg-75 kg] 75 kg (11/10 1944)  Weight change:  Filed Weights   10/25/21 1943 10/25/21 1944  Weight: 72 kg 75 kg    Intake/Output: No intake/output data recorded.   Intake/Output this shift:  Total I/O In: 240 [P.O.:240] Out: -   Physical Exam: General: No acute distress  Head: Normocephalic, atraumatic. Moist oral mucosal membranes  Eyes: Anicteric  Neck: Supple  Lungs:  Clear to auscultation, normal effort  Heart: S1S2 no rubs  Abdomen:  Soft, nontender, bowel sounds present  Extremities: Mild left ankle swelling  Neurologic: Awake, alert, following commands  Skin: No acute rash  Access: Left upper extremity AV fistula    Basic Metabolic Panel: Recent Labs  Lab 10/25/21 2015 10/26/21 0436  NA 135 137  K 3.7 3.9  CL 99 100  CO2 25 25  GLUCOSE 118* 99  BUN 60* 62*  CREATININE 6.27* 7.26*  CALCIUM 8.3* 8.2*    Liver Function Tests: Recent Labs  Lab 10/25/21 2015 10/26/21 0436  AST 52* 41  ALT 27 26  ALKPHOS 327* 298*  BILITOT 1.2 1.1  PROT 6.7 6.5  ALBUMIN 2.9* 2.6*   No results for input(s): LIPASE, AMYLASE in the last 168 hours. Recent Labs  Lab 10/25/21 2213 10/26/21 0436  AMMONIA 50* 71*    CBC: Recent Labs  Lab 10/25/21 2015 10/26/21 0436  WBC 8.1 7.5  NEUTROABS 6.0  --   HGB 8.8* 8.5*  HCT 27.3* 26.0*  MCV 100.0 100.4*  PLT 151 153     Cardiac Enzymes: Recent Labs  Lab 10/25/21 2015  CKTOTAL 86    BNP: Invalid input(s): POCBNP  CBG: Recent Labs  Lab 10/26/21 0136 10/26/21 0743  GLUCAP 59 102*    Microbiology: Results for orders placed or performed during the hospital encounter of 10/25/21  Resp Panel by RT-PCR (Flu A&B, Covid) Nasopharyngeal Swab     Status: None   Collection Time: 10/25/21  8:15 PM   Specimen: Nasopharyngeal Swab; Nasopharyngeal(NP) swabs in vial transport medium  Result Value Ref Range Status   SARS Coronavirus 2 by RT PCR NEGATIVE NEGATIVE Final    Comment: (NOTE) SARS-CoV-2 target nucleic acids are NOT DETECTED.  The SARS-CoV-2 RNA is generally detectable in upper respiratory specimens during the acute phase of infection. The lowest concentration of SARS-CoV-2 viral copies this assay can detect is 138 copies/mL. A negative result does not preclude SARS-Cov-2 infection and should not be used as the sole basis for treatment or other patient management decisions. A negative result may occur with  improper specimen collection/handling, submission of specimen other than nasopharyngeal swab, presence of viral mutation(s) within the areas targeted by this assay, and inadequate number of viral copies(<138 copies/mL). A negative result must be combined with clinical observations, patient history, and epidemiological information. The expected result is Negative.  Fact Sheet for Patients:  EntrepreneurPulse.com.au  Fact  Sheet for Healthcare Providers:  IncredibleEmployment.be  This test is no t yet approved or cleared by the [place] FDA and  has been authorized for detection and/or diagnosis of SARS-CoV-2 by FDA under an Emergency Use Authorization (EUA). This EUA will remain  in effect (meaning this test can be used) for the duration of the COVID-19 declaration under Section 564(b)(1) of the Act, 21 U.S.C.section 360bbb-3(b)(1), unless the  authorization is terminated  or revoked sooner.       Influenza A by PCR NEGATIVE NEGATIVE Final   Influenza B by PCR NEGATIVE NEGATIVE Final    Comment: (NOTE) The Xpert Xpress SARS-CoV-2/FLU/RSV plus assay is intended as an aid in the diagnosis of influenza from Nasopharyngeal swab specimens and should not be used as a sole basis for treatment. Nasal washings and aspirates are unacceptable for Xpert Xpress SARS-CoV-2/FLU/RSV testing.  Fact Sheet for Patients: EntrepreneurPulse.com.au  Fact Sheet for Healthcare Providers: IncredibleEmployment.be  This test is not yet approved or cleared by the [place] FDA and has been authorized for detection and/or diagnosis of SARS-CoV-2 by FDA under an Emergency Use Authorization (EUA). This EUA will remain in effect (meaning this test can be used) for the duration of the COVID-19 declaration under Section 564(b)(1) of the Act, 21 U.S.C. section 360bbb-3(b)(1), unless the authorization is terminated or revoked.  Performed at [facility], [address] [place], [place] [address]     Coagulation Studies: Recent Labs    10/25/21 03/15/15  LABPROT 15.3*  INR 1.2    Urinalysis: No results for input(s): COLORURINE, LABSPEC, PHURINE, GLUCOSEU, HGBUR, BILIRUBINUR, KETONESUR, PROTEINUR, UROBILINOGEN, NITRITE, LEUKOCYTESUR in the last 72 hours.  Invalid input(s): APPERANCEUR    Imaging: DG Ankle Complete Left  Result Date: 10/25/2021 CLINICAL DATA:  Status post fall EXAM: LEFT ANKLE COMPLETE - 3+ VIEW COMPARISON:  None. FINDINGS: Diffusely decreased bone density. There is no evidence of fracture, dislocation, or joint effusion. There is no evidence of arthropathy or other focal bone abnormality. Diffuse subcutaneus soft tissue edema. Vascular calcifications. IMPRESSION: 1. No acute displaced fracture or dislocation. 2. Diffusely decreased bone density. Electronically Signed   By: Iven Finn M.D.   On: 10/25/2021 20:33   US Venous Img Lower Unilateral Left  Result Date: 10/25/2021 CLINICAL DATA:  Pain and swelling EXAM: Left LOWER EXTREMITY VENOUS DOPPLER ULTRASOUND TECHNIQUE: Gray-scale sonography with compression, as well as color and duplex ultrasound, were performed to evaluate the deep venous system(s) from the level of the common femoral vein through the popliteal and proximal calf veins. COMPARISON:  None. FINDINGS: VENOUS Normal compressibility of the common femoral, superficial femoral, and popliteal veins, as well as the visualized calf veins. Visualized portions of profunda femoral vein and great saphenous vein unremarkable. No filling defects to suggest DVT on grayscale or color Doppler imaging. Doppler waveforms show normal direction of venous flow, normal respiratory plasticity and response to augmentation. Limited views of the contralateral common femoral vein are unremarkable. OTHER None. Limitations: none IMPRESSION: No deep venous thrombosis left lower extremity. Electronically Signed   By: Iven Finn M.D.   On: 10/25/2021 21:33     Medications:     allopurinol  100 mg Oral Daily   atorvastatin  20 mg Oral Daily   heparin injection (subcutaneous)  5,000 Units Subcutaneous Q8H   hydrALAZINE  50 mg Oral TID   insulin aspart  0-15 Units Subcutaneous TID WC   insulin aspart  0-5 Units Subcutaneous QHS   lactulose  20  g Oral BID   metoprolol succinate  25 mg Oral Daily   pantoprazole  40 mg Oral Daily   [START ON 10/27/2021] predniSONE  40 mg Oral Q breakfast   sodium bicarbonate  650 mg Oral Daily   tamsulosin  0.4 mg Oral QPC supper   acetaminophen **OR** acetaminophen, HYDROcodone-acetaminophen, ondansetron **OR** ondansetron (ZOFRAN) IV  Assessment/ Plan:  80 y.o. male with past medical history of diabetes mellitus type 2, hypertension, ESRD on HD TTS, anemia of chronic kidney disease, cirrhosis, secondary hyperparathyroidism who was admitted with  left foot/ankle pain.  [facility]  1.  ESRD on HD TTS.  Patient did undergo hemodialysis treatment yesterday.  No acute indication for dialysis today.  We will plan for hemodialysis treatment again tomorrow.  2.  Anemia of chronic kidney disease.  Hemoglobin 8.5.  Consider starting the patient on Epogen with dialysis tomorrow.  3.  Secondary hyperparathyroidism.  Monitor bone mineral metabolism parameters over the course of the hospitalization.  Not currently on binder treatment.  4.  Hypertension.  Continue patient on hydralazine and metoprolol for blood pressure control.  5.  Diabetes mellitus type 2 with chronic kidney disease.  Maintain the patient on current insulin regimen.  Glycemic control as per hospitalist.   LOS: 1 Thanh Mottern 11/11/202210:35 AM

## 2021-10-26 NOTE — TOC CM/SW Note (Addendum)
Per PT, patient is not AOx4. Left voicemail for daughter [name]. Will discuss SNF recommendation when she calls back. Emailed Development worker, community to make her aware of patient's concerns with paying for hospital bill.  Dayton Scrape, [place]

## 2021-10-26 NOTE — Progress Notes (Signed)
B Morrison notified of pt's bp 127/44, Hr 74, order to hold

## 2021-10-27 DIAGNOSIS — R531 Weakness: Secondary | ICD-10-CM | POA: Diagnosis not present

## 2021-10-27 LAB — CBC
HCT: 26.1 % — ABNORMAL LOW (ref 39.0–52.0)
HCT: 26 % — ABNORMAL LOW (ref 39.0–52.0)
Hemoglobin: 8.3 g/dL — ABNORMAL LOW (ref 13.0–17.0)
Hemoglobin: 8.5 g/dL — ABNORMAL LOW (ref 13.0–17.0)
MCH: 31.6 pg (ref 26.0–34.0)
MCH: 32.7 pg (ref 26.0–34.0)
MCHC: 31.8 g/dL (ref 30.0–36.0)
MCHC: 32.7 g/dL (ref 30.0–36.0)
MCV: 99.2 fL (ref 80.0–100.0)
MCV: 100 fL (ref 80.0–100.0)
Platelets: 160 10*3/uL (ref 150–400)
Platelets: 162 10*3/uL (ref 150–400)
RBC: 2.63 MIL/uL — ABNORMAL LOW (ref 4.22–5.81)
RBC: 2.6 MIL/uL — ABNORMAL LOW (ref 4.22–5.81)
RDW: 15.2 % (ref 11.5–15.5)
RDW: 15.2 % (ref 11.5–15.5)
WBC: 6 10*3/uL (ref 4.0–10.5)
WBC: 5.6 10*3/uL (ref 4.0–10.5)
nRBC: 0 % (ref 0.0–0.2)
nRBC: 0 % (ref 0.0–0.2)

## 2021-10-27 LAB — RENAL FUNCTION PANEL
Albumin: 2.7 g/dL — ABNORMAL LOW (ref 3.5–5.0)
Albumin: 2.4 g/dL — ABNORMAL LOW (ref 3.5–5.0)
Anion gap: 11 (ref 5–15)
Anion gap: 13 (ref 5–15)
BUN: 80 mg/dL — ABNORMAL HIGH (ref 8–23)
BUN: 80 mg/dL — ABNORMAL HIGH (ref 8–23)
CO2: 25 mmol/L (ref 22–32)
CO2: 24 mmol/L (ref 22–32)
Calcium: 8.3 mg/dL — ABNORMAL LOW (ref 8.9–10.3)
Calcium: 8.4 mg/dL — ABNORMAL LOW (ref 8.9–10.3)
Chloride: 99 mmol/L (ref 98–111)
Chloride: 98 mmol/L (ref 98–111)
Creatinine, Ser: 9 mg/dL — ABNORMAL HIGH (ref 0.61–1.24)
Creatinine, Ser: 9.57 mg/dL — ABNORMAL HIGH (ref 0.61–1.24)
GFR, Estimated: 5 mL/min — ABNORMAL LOW (ref >60)
GFR, Estimated: 5 mL/min — ABNORMAL LOW (ref >60)
Glucose, Bld: 119 mg/dL — ABNORMAL HIGH (ref 70–99)
Glucose, Bld: 198 mg/dL — ABNORMAL HIGH (ref 70–99)
Phosphorus: 3 mg/dL (ref 2.5–4.6)
Phosphorus: 3.1 mg/dL (ref 2.5–4.6)
Potassium: 4.3 mmol/L (ref 3.5–5.1)
Potassium: 4.4 mmol/L (ref 3.5–5.1)
Sodium: 135 mmol/L (ref 135–145)
Sodium: 135 mmol/L (ref 135–145)

## 2021-10-27 LAB — GLUCOSE, CAPILLARY
Glucose-Capillary: 189 mg/dL — ABNORMAL HIGH (ref 70–99)
Glucose-Capillary: 232 mg/dL — ABNORMAL HIGH (ref 70–99)
Glucose-Capillary: 86 mg/dL (ref 70–99)

## 2021-10-27 LAB — HEPATITIS B SURFACE ANTIBODY,QUALITATIVE: Hep B S Ab: NONREACTIVE

## 2021-10-27 LAB — URIC ACID: Uric Acid, Serum: 3.9 mg/dL (ref 3.7–8.6)

## 2021-10-27 MED ORDER — PENTAFLUOROPROP-TETRAFLUOROETH EX AERO
1.0000 "application " | INHALATION_SPRAY | CUTANEOUS | Status: DC | PRN
  Filled 2021-10-27: qty 30

## 2021-10-27 MED ORDER — SODIUM CHLORIDE 0.9 % IV SOLN: 100.0000 mL | INTRAVENOUS | Status: DC | PRN

## 2021-10-27 MED ORDER — CHLORHEXIDINE GLUCONATE CLOTH 2 % EX PADS
6.0000 | MEDICATED_PAD | Freq: Every day | CUTANEOUS | Status: DC
  Administered 2021-10-27: 6 via TOPICAL

## 2021-10-27 MED ORDER — LIDOCAINE HCL (PF) 1 % IJ SOLN
5.0000 mL | INTRAMUSCULAR | Status: DC | PRN
  Filled 2021-10-27: qty 5

## 2021-10-27 MED ORDER — ALTEPLASE 2 MG IJ SOLR: 2.0000 mg | Freq: Once | INTRAMUSCULAR | Status: DC | PRN

## 2021-10-27 MED ORDER — HEPARIN SODIUM (PORCINE) 1000 UNIT/ML DIALYSIS: 1000.0000 [IU] | INTRAMUSCULAR | Status: DC | PRN

## 2021-10-27 MED ORDER — LIDOCAINE-PRILOCAINE 2.5-2.5 % EX CREA
1.0000 "application " | TOPICAL_CREAM | CUTANEOUS | Status: DC | PRN
  Filled 2021-10-27: qty 5

## 2021-10-27 NOTE — Plan of Care (Signed)
I came to see the patient today but he was unavailable due to being in dialysis.  Discussed this case with Dr. Si Raider.  I suspect is likely gout considering his renal history and history of gout.  Will check uric acid.  I will evaluate tomorrow hopefully will resolve with steroid administration  Lanae Crumbly, DPM 10/27/2021

## 2021-10-27 NOTE — Progress Notes (Signed)
Pt tolerated 3 hr HD session, without incident. AVF maintained prescribed BFR, UF target met, Patient returned to bed post treatment.

## 2021-10-27 NOTE — Progress Notes (Signed)
[facility]  Dialysis Note   Subjective:   Seen and examined on hemodialysis treatment.  Left AV fistula works well. Discharge planning in progress.   HEMODIALYSIS FLOWSHEET:  Blood Flow Rate (mL/min): 400 mL/min Arterial Pressure (mmHg): -170 mmHg Venous Pressure (mmHg): 150 mmHg Transmembrane Pressure (mmHg): 60 mmHg Ultrafiltration Rate (mL/min): 517 mL/min Dialysate Flow Rate (mL/min): 500 ml/min Conductivity: Machine : 13.7 Conductivity: Machine : 13.7    Objective:  Vital signs in last 24 hours:  Temp:  [98.3 F (36.8 C)-99.1 F (37.3 C)] 98.4 F (36.9 C) (11/12 0819) Pulse Rate:  [61-74] 65 (11/12 0819) Resp:  [14-21] 19 (11/12 1245) BP: (127-159)/(44-87) 153/56 (11/12 1230) SpO2:  [96 %-98 %] 97 % (11/12 0819) Weight:  [113.4 kg] 113.4 kg (11/12 0920)  Weight change:  Filed Weights   10/25/21 1943 10/25/21 1944 10/27/21 0920  Weight: 72 kg 75 kg 113.4 kg    Intake/Output: I/O last 3 completed shifts: In: 480 [P.O.:480] Out: -    Intake/Output this shift:  No intake/output data recorded.  Physical Exam: General: NAD,   Head: Normocephalic, atraumatic. Moist oral mucosal membranes  Eyes: Anicteric, PERRL  Neck: Supple, trachea midline  Lungs:  Clear to auscultation  Heart: Regular rate and rhythm  Abdomen:  Soft, nontender,   Extremities:  peripheral edema.  Neurologic: Nonfocal, moving all four extremities  Skin: No lesions  Access:     Basic Metabolic Panel: Recent Labs  Lab 10/25/21 2015 10/26/21 0436 10/27/21 0421 10/27/21 0838  NA 135 137 135 135  K 3.7 3.9 4.3 4.4  CL 99 100 99 98  CO2 25 25 25 24   GLUCOSE 118* 99 119* 198*  BUN 60* 62* 80* 80*  CREATININE 6.27* 7.26* 9.00* 9.57*  CALCIUM 8.3* 8.2* 8.3* 8.4*  PHOS  --   --  3.0 3.1    Liver Function Tests: Recent Labs  Lab 10/25/21 2015 10/26/21 0436 10/27/21 0421 10/27/21 0838  AST 52* 41  --   --   ALT 27 26  --   --   ALKPHOS 327* 298*  --   --    BILITOT 1.2 1.1  --   --   PROT 6.7 6.5  --   --   ALBUMIN 2.9* 2.6* 2.7* 2.4*   No results for input(s): LIPASE, AMYLASE in the last 168 hours. Recent Labs  Lab 10/25/21 2213 10/26/21 0436  AMMONIA 50* 71*    CBC: Recent Labs  Lab 10/25/21 2015 10/26/21 0436 10/27/21 0421 10/27/21 0838  WBC 8.1 7.5 6.0 5.6  NEUTROABS 6.0  --   --   --   HGB 8.8* 8.5* 8.3* 8.5*  HCT 27.3* 26.0* 26.1* 26.0*  MCV 100.0 100.4* 99.2 100.0  PLT 151 153 160 162    Cardiac Enzymes: Recent Labs  Lab 10/25/21 2015  CKTOTAL 86    BNP: Invalid input(s): POCBNP  CBG: Recent Labs  Lab 10/26/21 0743 10/26/21 1202 10/26/21 1616 10/26/21 2047 10/27/21 0818  GLUCAP 102* 152* 103* 112* 189*    Microbiology: Results for orders placed or performed during the hospital encounter of 10/25/21  Resp Panel by RT-PCR (Flu A&B, Covid) Nasopharyngeal Swab     Status: None   Collection Time: 10/25/21  8:15 PM   Specimen: Nasopharyngeal Swab; Nasopharyngeal(NP) swabs in vial transport medium  Result Value Ref Range Status   SARS Coronavirus 2 by RT PCR NEGATIVE NEGATIVE Final    Comment: (NOTE) SARS-CoV-2 target nucleic acids are NOT DETECTED.  The SARS-CoV-2 RNA is generally detectable in upper respiratory specimens during the acute phase of infection. The lowest concentration of SARS-CoV-2 viral copies this assay can detect is 138 copies/mL. A negative result does not preclude SARS-Cov-2 infection and should not be used as the sole basis for treatment or other patient management decisions. A negative result may occur with  improper specimen collection/handling, submission of specimen other than nasopharyngeal swab, presence of viral mutation(s) within the areas targeted by this assay, and inadequate number of viral copies(<138 copies/mL). A negative result must be combined with clinical observations, patient history, and epidemiological information. The expected result is Negative.  Fact  Sheet for Patients:  EntrepreneurPulse.com.au  Fact Sheet for Healthcare Providers:  IncredibleEmployment.be  This test is no t yet approved or cleared by the [place] FDA and  has been authorized for detection and/or diagnosis of SARS-CoV-2 by FDA under an Emergency Use Authorization (EUA). This EUA will remain  in effect (meaning this test can be used) for the duration of the COVID-19 declaration under Section 564(b)(1) of the Act, 21 U.S.C.section 360bbb-3(b)(1), unless the authorization is terminated  or revoked sooner.       Influenza A by PCR NEGATIVE NEGATIVE Final   Influenza B by PCR NEGATIVE NEGATIVE Final    Comment: (NOTE) The Xpert Xpress SARS-CoV-2/FLU/RSV plus assay is intended as an aid in the diagnosis of influenza from Nasopharyngeal swab specimens and should not be used as a sole basis for treatment. Nasal washings and aspirates are unacceptable for Xpert Xpress SARS-CoV-2/FLU/RSV testing.  Fact Sheet for Patients: EntrepreneurPulse.com.au  Fact Sheet for Healthcare Providers: IncredibleEmployment.be  This test is not yet approved or cleared by the [place] FDA and has been authorized for detection and/or diagnosis of SARS-CoV-2 by FDA under an Emergency Use Authorization (EUA). This EUA will remain in effect (meaning this test can be used) for the duration of the COVID-19 declaration under Section 564(b)(1) of the Act, 21 U.S.C. section 360bbb-3(b)(1), unless the authorization is terminated or revoked.  Performed at [facility], [address] [place], [place] [address]     Coagulation Studies: Recent Labs    10/25/21 03/09/2015  LABPROT 15.3*  INR 1.2    Urinalysis: No results for input(s): COLORURINE, LABSPEC, PHURINE, GLUCOSEU, HGBUR, BILIRUBINUR, KETONESUR, PROTEINUR, UROBILINOGEN, NITRITE, LEUKOCYTESUR in the last 72 hours.  Invalid input(s):  APPERANCEUR    Imaging: DG Ankle Complete Left  Result Date: 10/25/2021 CLINICAL DATA:  Status post fall EXAM: LEFT ANKLE COMPLETE - 3+ VIEW COMPARISON:  None. FINDINGS: Diffusely decreased bone density. There is no evidence of fracture, dislocation, or joint effusion. There is no evidence of arthropathy or other focal bone abnormality. Diffuse subcutaneus soft tissue edema. Vascular calcifications. IMPRESSION: 1. No acute displaced fracture or dislocation. 2. Diffusely decreased bone density. Electronically Signed   By: Iven Finn M.D.   On: 10/25/2021 20:33   US Venous Img Lower Unilateral Left  Result Date: 10/25/2021 CLINICAL DATA:  Pain and swelling EXAM: Left LOWER EXTREMITY VENOUS DOPPLER ULTRASOUND TECHNIQUE: Gray-scale sonography with compression, as well as color and duplex ultrasound, were performed to evaluate the deep venous system(s) from the level of the common femoral vein through the popliteal and proximal calf veins. COMPARISON:  None. FINDINGS: VENOUS Normal compressibility of the common femoral, superficial femoral, and popliteal veins, as well as the visualized calf veins. Visualized portions of profunda femoral vein and great saphenous vein unremarkable. No filling defects to suggest DVT on grayscale or color Doppler  imaging. Doppler waveforms show normal direction of venous flow, normal respiratory plasticity and response to augmentation. Limited views of the contralateral common femoral vein are unremarkable. OTHER None. Limitations: none IMPRESSION: No deep venous thrombosis left lower extremity. Electronically Signed   By: Iven Finn M.D.   On: 10/25/2021 21:33     Medications:    sodium chloride     sodium chloride      allopurinol  100 mg Oral Daily   atorvastatin  20 mg Oral QPM   Chlorhexidine Gluconate Cloth  6 each Topical Q0600   feeding supplement (NEPRO CARB STEADY)  237 mL Oral TID BM   heparin injection (subcutaneous)  5,000 Units Subcutaneous  Q8H   hydrALAZINE  50 mg Oral TID   insulin aspart  0-15 Units Subcutaneous TID WC   insulin aspart  0-5 Units Subcutaneous QHS   lactulose  20 g Oral BID   metoprolol succinate  25 mg Oral Daily   multivitamin  1 tablet Oral QHS   pantoprazole  40 mg Oral Daily   predniSONE  40 mg Oral Q breakfast   sodium bicarbonate  650 mg Oral Daily   tamsulosin  0.4 mg Oral QPC supper   sodium chloride, sodium chloride, acetaminophen **OR** acetaminophen, alteplase, heparin, HYDROcodone-acetaminophen, lidocaine (PF), lidocaine-prilocaine, ondansetron **OR** ondansetron (ZOFRAN) IV, pentafluoroprop-tetrafluoroeth  Assessment/ Plan:  [name] is a 80 y.o.  male   Principal Problem:   Generalized weakness Active Problems:   Hypertension   ESRD on hemodialysis ([place])   Left foot pain   Anemia of chronic kidney failure, stage 5 ([place])   Diabetes mellitus, type II ([place])   History of recent fall   Hyperammonemia ([name])   Hepatic cirrhosis ([name])   Inadequate oral intake   Malnutrition of moderate degree  64 male with history of diabetes, peripheral vascular disease, hypertension, cirrhosis, secondary hyperparathyroidism, end-stage renal disease on dialysis with anemia now admitted with history of left foot and ankle pain.   End Stage Renal Disease on hemodialysis: Continue stable dialysis  Potassium [facility] vascular lab)  Date Value Ref Range Status  01/11/2019 4.1 3.5 - 5.1 Corrected    Comment:    Performed at [facility], [address] [place], [place] [address]    Intake/Output Summary (Last 24 hours) at 10/27/2021 1258 Last data filed at 10/26/2021 1842 Gross per 24 hour  Intake 240 ml  Output --  Net 240 ml    2. Hypertension with chronic kidney disease: Blood pressure is acceptable.   BP (!) 153/56   Pulse 65   Temp 98.4 F (36.9 C) (Oral)   Resp 19   Ht 5\' 5"  (1.651 m)   Wt 113.4 kg   SpO2 97%   BMI 41.60 kg/m   3. Anemia of chronic  kidney disease/ kidney injury/chronic disease/acute blood loss: We will continue anemia protocols. Lab Results  Component Value Date   HGB 8.5 (L) 10/27/2021    4. Secondary Hyperparathyroidism: Continue to monitor closely.  Not on binders at this time.   Lab Results  Component Value Date   PTH 91 (H) 03/28/2017   CALCIUM 8.4 (L) 10/27/2021   PHOS 3.1 10/27/2021    #5: Diabetes: Continue the present medications.  Discharge planning is in progress.   LOS: [place], MD [facility] 11/12/202212:58 PM

## 2021-10-27 NOTE — TOC Progression Note (Addendum)
Transition of Care [facility]) - Progression Note    Patient Details  Name: Martin Gilbert MRN: [id number] Date of Birth: Aug 03, 1941  Transition of Care [facility]) CM/SW [place], LCSW Phone Number: 10/27/2021, 9:34 AM  Clinical Narrative:   Per HD Coordinator Amanda,patient's chair time will be Tues, Thurs, Sat at 5:45 am. CSW called and updated patient's daughter [name] that [facility] has accepted patient for STR.  12:00- Per RN in rounds, patient is medically ready for SNF. CSW asked Otila Kluver at [facility] if they can take patient tomorrow. Per Otila Kluver, the earliest they an take patient is Monday due to their Pharmacy being closed.  Expected Discharge Plan: [place] Barriers to Discharge: Continued Medical Work up  Expected Discharge Plan and Services Expected Discharge Plan: [place] Choice: [place] arrangements for the past 2 months: Single Family Home                                       Social Determinants of Health (SDOH) Interventions    Readmission Risk Interventions Readmission Risk Prevention Plan 04/16/2021  Transportation Screening Complete  Home Care Screening Complete  Medication Review (RN CM) Complete  Some recent data might be hidden

## 2021-10-27 NOTE — Progress Notes (Signed)
PROGRESS NOTE    Martin Gilbert  PRF:[id number] DOB: March 31, 1941 DOA: 10/25/2021 PCP: [name], MD  Outpatient Specialists: nephrology    Brief Narrative:   Martin Gilbert is a 80 y.o. male with medical history significant for DM, HTN, ESRD on HD TTS, anemia of CKD, hepatic cirrhosis, who was sent to the ED from dialysis because of generalized weakness and left foot pain that prevents him from ambulating.  Patient fell a week ago injuring his left ankle and has been unable to ambulate on the foot since.  Patient at baseline ambulates independently and takes care of his wife and the home.  He states he overall feels very weak and has no appetite.  He denies chest pain, shortness of breath, fever or chills.  Denies nausea or vomiting and denies diarrhea.  States his biggest problem is the pain in his left foot and ankle and the weakness he feels.  Patient states he has not been taking his medication due to his poor oral intake.   Assessment & Plan:   Principal Problem:   Generalized weakness Active Problems:   Hypertension   ESRD on hemodialysis ([name])   Left foot pain   Anemia of chronic kidney failure, stage 5 ([name])   Diabetes mellitus, type II ([place])   History of recent fall   Hyperammonemia ([name])   Hepatic cirrhosis ([name])   Inadequate oral intake   Malnutrition of moderate degree  # Left ankle pain Daughter reports trip and fall a week ago and since then complaining of left ankle pain and difficulty walking. On admission noted mild diffuse right ankle swelling and the ankle is warm. Most tender at achilles insertion site. X-ray without fracture. Patient denies history of gout BUT the patient is on allopurinol. PVL neg for dvt. Ddx ankle sprain vs gout. Low suspicion for septic joint. Today erythema resolved, swelling improved, making gout more likely - podiatry to see - continue prednisone, f/u uric acid level - pt/ot consulted, advising snf - possible  d/c to snf tomorrow  # Lethargy and confusion Daughter reports some recent lethargy and confusion. She reports patient self-discontinued lactulose so hepatic encephalopathy the most likely acute culprit, though patient with significant chronic medical conditions that no doubt contribute. Appears to have returned to baseline - cont lactulose - cirrhosis as below  # ESRD On tts dialysis, hasn't missed any recent dialysis - nephrology following, tolerated dialysis today  # Cirrhosis Appears compensated. No documented hx of varices; I do not see report in our records or care record of endoscopy. Mild hepatic encephalopathy as above - continue lactulose, titrate as needed  # HTN Here bp mild elevation - home hydral, metop  # Anemia on ckd Hgb stable in 8s - monitor, likely epo w/ dialysis  # T2DM Sugars wnl, not on meds - SSI while on steroids  # BPH - cont flomax  DVT prophylaxis: heparin Code Status: full Family Communication: daughter updated telephonically 11/12  Level of care: Med-Surg Status is: Inpatient  Remains inpatient appropriate because: unsafe d/c plan, ongoing w/u   Consultants:  Podiatry, nephrology  Procedures: none  Antimicrobials:  none    Subjective: Tolerated dialysis. Ankle pain improving. No confusion.   Objective: Vitals:   10/27/21 1300 10/27/21 1315 10/27/21 1330 10/27/21 1345  BP: (!) 142/56 (!) 145/54 (!) 137/58 (!) 145/52  Pulse:      Resp: 17 17 15 19   Temp:      TempSrc:      SpO2:  Weight:      Height:        Intake/Output Summary (Last 24 hours) at 10/27/2021 1442 Last data filed at 10/26/2021 1842 Gross per 24 hour  Intake 240 ml  Output --  Net 240 ml   Filed Weights   10/25/21 1943 10/25/21 1944 10/27/21 0920  Weight: 72 kg 75 kg 113.4 kg    Examination:  General exam: Appears calm and comfortable  Respiratory system: Clear to auscultation. Respiratory effort normal. Cardiovascular system: S1 & S2  heard, RRR. No JVD, murmurs, rubs, gallops or clicks. No pedal edema. Gastrointestinal system: Abdomen is mildly distended, soft and nontender. No organomegaly or masses felt. Normal bowel sounds heard. Central nervous system: Alert and oriented. No focal neurological deficits. Extremities: Symmetric 5 x 5 power. Skin: No rashes, lesions or ulcers Psychiatry: Judgement and insight appear normal. Mood & affect appropriate.  Msk: mild diffuse left joint swelling with mild warmth. Ttp achilles insertion and with movement.     Data Reviewed: I have personally reviewed following labs and imaging studies  CBC: Recent Labs  Lab 10/25/21 2015 10/26/21 0436 10/27/21 0421 10/27/21 0838  WBC 8.1 7.5 6.0 5.6  NEUTROABS 6.0  --   --   --   HGB 8.8* 8.5* 8.3* 8.5*  HCT 27.3* 26.0* 26.1* 26.0*  MCV 100.0 100.4* 99.2 100.0  PLT 151 153 160 244   Basic Metabolic Panel: Recent Labs  Lab 10/25/21 2015 10/26/21 0436 10/27/21 0421 10/27/21 0838  NA 135 137 135 135  K 3.7 3.9 4.3 4.4  CL 99 100 99 98  CO2 25 25 25 24   GLUCOSE 118* 99 119* 198*  BUN 60* 62* 80* 80*  CREATININE 6.27* 7.26* 9.00* 9.57*  CALCIUM 8.3* 8.2* 8.3* 8.4*  PHOS  --   --  3.0 3.1   GFR: Estimated Creatinine Clearance: 7 mL/min (A) (by C-G formula based on SCr of 9.57 mg/dL (H)). Liver Function Tests: Recent Labs  Lab 10/25/21 2015 10/26/21 0436 10/27/21 0421 10/27/21 0838  AST 52* 41  --   --   ALT 27 26  --   --   ALKPHOS 327* 298*  --   --   BILITOT 1.2 1.1  --   --   PROT 6.7 6.5  --   --   ALBUMIN 2.9* 2.6* 2.7* 2.4*   No results for input(s): LIPASE, AMYLASE in the last 168 hours. Recent Labs  Lab 10/25/21 2213 10/26/21 0436  AMMONIA 50* 71*   Coagulation Profile: Recent Labs  Lab 10/25/21 2016  INR 1.2   Cardiac Enzymes: Recent Labs  Lab 10/25/21 2015  CKTOTAL 86   BNP (last 3 results) No results for input(s): PROBNP in the last 8760 hours. HbA1C: Recent Labs    10/25/21 2015   HGBA1C 4.8   CBG: Recent Labs  Lab 10/26/21 0743 10/26/21 1202 10/26/21 1616 10/26/21 2047 10/27/21 0818  GLUCAP 102* 152* 103* 112* 189*   Lipid Profile: No results for input(s): CHOL, HDL, LDLCALC, TRIG, CHOLHDL, LDLDIRECT in the last 72 hours. Thyroid Function Tests: No results for input(s): TSH, T4TOTAL, FREET4, T3FREE, THYROIDAB in the last 72 hours. Anemia Panel: No results for input(s): VITAMINB12, FOLATE, FERRITIN, TIBC, IRON, RETICCTPCT in the last 72 hours. Urine analysis:    Component Value Date/Time   COLORURINE RED (A) 07/20/2020 1851   APPEARANCEUR TURBID (A) 07/20/2020 1851   APPEARANCEUR Turbid 08/29/2013 2049   LABSPEC 1.020 07/20/2020 1851   LABSPEC 1.015 08/29/2013 2049  PHURINE  07/20/2020 1851    TEST NOT REPORTED DUE TO COLOR INTERFERENCE OF URINE PIGMENT   GLUCOSEU (A) 07/20/2020 1851    TEST NOT REPORTED DUE TO COLOR INTERFERENCE OF URINE PIGMENT   GLUCOSEU 50 mg/dL 08/29/2013 2049   HGBUR (A) 07/20/2020 1851    TEST NOT REPORTED DUE TO COLOR INTERFERENCE OF URINE PIGMENT   BILIRUBINUR (A) 07/20/2020 1851    TEST NOT REPORTED DUE TO COLOR INTERFERENCE OF URINE PIGMENT   BILIRUBINUR Negative 08/29/2013 2049   KETONESUR (A) 07/20/2020 1851    TEST NOT REPORTED DUE TO COLOR INTERFERENCE OF URINE PIGMENT   PROTEINUR (A) 07/20/2020 1851    TEST NOT REPORTED DUE TO COLOR INTERFERENCE OF URINE PIGMENT   NITRITE (A) 07/20/2020 1851    TEST NOT REPORTED DUE TO COLOR INTERFERENCE OF URINE PIGMENT   LEUKOCYTESUR (A) 07/20/2020 1851    TEST NOT REPORTED DUE TO COLOR INTERFERENCE OF URINE PIGMENT   LEUKOCYTESUR 3+ 08/29/2013 2049   Sepsis Labs: @LABRCNTIP (procalcitonin:4,lacticidven:4)  ) Recent Results (from the past 240 hour(s))  Resp Panel by RT-PCR (Flu A&B, Covid) Nasopharyngeal Swab     Status: None   Collection Time: 10/25/21  8:15 PM   Specimen: Nasopharyngeal Swab; Nasopharyngeal(NP) swabs in vial transport medium  Result Value Ref Range  Status   SARS Coronavirus 2 by RT PCR NEGATIVE NEGATIVE Final    Comment: (NOTE) SARS-CoV-2 target nucleic acids are NOT DETECTED.  The SARS-CoV-2 RNA is generally detectable in upper respiratory specimens during the acute phase of infection. The lowest concentration of SARS-CoV-2 viral copies this assay can detect is 138 copies/mL. A negative result does not preclude SARS-Cov-2 infection and should not be used as the sole basis for treatment or other patient management decisions. A negative result may occur with  improper specimen collection/handling, submission of specimen other than nasopharyngeal swab, presence of viral mutation(s) within the areas targeted by this assay, and inadequate number of viral copies(<138 copies/mL). A negative result must be combined with clinical observations, patient history, and epidemiological information. The expected result is Negative.  Fact Sheet for Patients:  EntrepreneurPulse.com.au  Fact Sheet for Healthcare Providers:  IncredibleEmployment.be  This test is no t yet approved or cleared by the [place] FDA and  has been authorized for detection and/or diagnosis of SARS-CoV-2 by FDA under an Emergency Use Authorization (EUA). This EUA will remain  in effect (meaning this test can be used) for the duration of the COVID-19 declaration under Section 564(b)(1) of the Act, 21 U.S.C.section 360bbb-3(b)(1), unless the authorization is terminated  or revoked sooner.       Influenza A by PCR NEGATIVE NEGATIVE Final   Influenza B by PCR NEGATIVE NEGATIVE Final    Comment: (NOTE) The Xpert Xpress SARS-CoV-2/FLU/RSV plus assay is intended as an aid in the diagnosis of influenza from Nasopharyngeal swab specimens and should not be used as a sole basis for treatment. Nasal washings and aspirates are unacceptable for Xpert Xpress SARS-CoV-2/FLU/RSV testing.  Fact Sheet for  Patients: EntrepreneurPulse.com.au  Fact Sheet for Healthcare Providers: IncredibleEmployment.be  This test is not yet approved or cleared by the [place] FDA and has been authorized for detection and/or diagnosis of SARS-CoV-2 by FDA under an Emergency Use Authorization (EUA). This EUA will remain in effect (meaning this test can be used) for the duration of the COVID-19 declaration under Section 564(b)(1) of the Act, 21 U.S.C. section 360bbb-3(b)(1), unless the authorization is terminated or revoked.  Performed at [facility]  [facility], [address], [place], [place] [address]          Radiology Studies: DG Ankle Complete Left  Result Date: 10/25/2021 CLINICAL DATA:  Status post fall EXAM: LEFT ANKLE COMPLETE - 3+ VIEW COMPARISON:  None. FINDINGS: Diffusely decreased bone density. There is no evidence of fracture, dislocation, or joint effusion. There is no evidence of arthropathy or other focal bone abnormality. Diffuse subcutaneus soft tissue edema. Vascular calcifications. IMPRESSION: 1. No acute displaced fracture or dislocation. 2. Diffusely decreased bone density. Electronically Signed   By: Iven Finn M.D.   On: 10/25/2021 20:33   US Venous Img Lower Unilateral Left  Result Date: 10/25/2021 CLINICAL DATA:  Pain and swelling EXAM: Left LOWER EXTREMITY VENOUS DOPPLER ULTRASOUND TECHNIQUE: Gray-scale sonography with compression, as well as color and duplex ultrasound, were performed to evaluate the deep venous system(s) from the level of the common femoral vein through the popliteal and proximal calf veins. COMPARISON:  None. FINDINGS: VENOUS Normal compressibility of the common femoral, superficial femoral, and popliteal veins, as well as the visualized calf veins. Visualized portions of profunda femoral vein and great saphenous vein unremarkable. No filling defects to suggest DVT on grayscale or color Doppler imaging. Doppler  waveforms show normal direction of venous flow, normal respiratory plasticity and response to augmentation. Limited views of the contralateral common femoral vein are unremarkable. OTHER None. Limitations: none IMPRESSION: No deep venous thrombosis left lower extremity. Electronically Signed   By: Iven Finn M.D.   On: 10/25/2021 21:33        Scheduled Meds:  allopurinol  100 mg Oral Daily   atorvastatin  20 mg Oral QPM   Chlorhexidine Gluconate Cloth  6 each Topical Q0600   feeding supplement (NEPRO CARB STEADY)  237 mL Oral TID BM   heparin injection (subcutaneous)  5,000 Units Subcutaneous Q8H   hydrALAZINE  50 mg Oral TID   insulin aspart  0-15 Units Subcutaneous TID WC   insulin aspart  0-5 Units Subcutaneous QHS   lactulose  20 g Oral BID   metoprolol succinate  25 mg Oral Daily   multivitamin  1 tablet Oral QHS   pantoprazole  40 mg Oral Daily   predniSONE  40 mg Oral Q breakfast   sodium bicarbonate  650 mg Oral Daily   tamsulosin  0.4 mg Oral QPC supper   Continuous Infusions:   LOS: 2 days    Time spent: 25 min    Desma Maxim, MD [facility]   If 7PM-7AM, please contact night-coverage [email or web address] Password [email or web address] 10/27/2021, 2:42 PM

## 2021-10-28 DIAGNOSIS — R531 Weakness: Secondary | ICD-10-CM | POA: Diagnosis not present

## 2021-10-28 DIAGNOSIS — M7662 Achilles tendinitis, left leg: Secondary | ICD-10-CM | POA: Diagnosis not present

## 2021-10-28 LAB — RENAL FUNCTION PANEL
Albumin: 2.7 g/dL — ABNORMAL LOW (ref 3.5–5.0)
Anion gap: 9 (ref 5–15)
BUN: 48 mg/dL — ABNORMAL HIGH (ref 8–23)
CO2: 29 mmol/L (ref 22–32)
Calcium: 8.3 mg/dL — ABNORMAL LOW (ref 8.9–10.3)
Chloride: 96 mmol/L — ABNORMAL LOW (ref 98–111)
Creatinine, Ser: 6.36 mg/dL — ABNORMAL HIGH (ref 0.61–1.24)
GFR, Estimated: 8 mL/min — ABNORMAL LOW (ref >60)
Glucose, Bld: 108 mg/dL — ABNORMAL HIGH (ref 70–99)
Phosphorus: 2.6 mg/dL (ref 2.5–4.6)
Potassium: 3.6 mmol/L (ref 3.5–5.1)
Sodium: 134 mmol/L — ABNORMAL LOW (ref 135–145)

## 2021-10-28 LAB — GLUCOSE, CAPILLARY
Glucose-Capillary: 93 mg/dL (ref 70–99)
Glucose-Capillary: 177 mg/dL — ABNORMAL HIGH (ref 70–99)
Glucose-Capillary: 148 mg/dL — ABNORMAL HIGH (ref 70–99)
Glucose-Capillary: 183 mg/dL — ABNORMAL HIGH (ref 70–99)

## 2021-10-28 LAB — HEPATITIS B SURFACE ANTIGEN: Hepatitis B Surface Ag: NONREACTIVE

## 2021-10-28 MED ORDER — PREDNISONE 20 MG PO TABS
20.0000 mg | ORAL_TABLET | Freq: Every day | ORAL | Status: DC
  Administered 2021-10-29: 20 mg via ORAL
  Filled 2021-10-28: qty 1

## 2021-10-28 MED ORDER — POLYVINYL ALCOHOL 1.4 % OP SOLN
1.0000 [drp] | OPHTHALMIC | Status: DC | PRN
  Administered 2021-10-28: 1 [drp] via OPHTHALMIC
  Filled 2021-10-28: qty 15

## 2021-10-28 NOTE — Consult Note (Signed)
Reason for Consult: Swelling of left ankle and pain Referring Physician: Laurey Arrow, MD  Martin Gilbert is an 80 y.o. male.  HPI: Had a fall about 2 weeks ago and is admitted for fall risk assessment and is planning to go to skilled nursing tomorrow.  States that hurts mostly in the back of the left ankle.  Does have a history of gout and takes allopurinol.  Past Medical History:  Diagnosis Date   Anemia    Arthritis    Back pain    BPH (benign prostatic hyperplasia)    CKD (chronic kidney disease), stage IV ([name])    Clostridium difficile colitis    Diabetes mellitus without complication ([name])    GERD (gastroesophageal reflux disease)    HTN (hypertension)    Hyperlipidemia     Past Surgical History:  Procedure Laterality Date   A/V FISTULAGRAM Left 04/20/2018   Procedure: A/V FISTULAGRAM;  Surgeon: Algernon Huxley, MD;  Location: [facility];  Service: Cardiovascular;  Laterality: Left;   A/V FISTULAGRAM Left 11/25/2018   Procedure: A/V FISTULAGRAM;  Surgeon: Algernon Huxley, MD;  Location: [place];  Service: Cardiovascular;  Laterality: Left;   A/V FISTULAGRAM Left 01/11/2019   Procedure: A/V FISTULAGRAM;  Surgeon: Algernon Huxley, MD;  Location: [place] [facility];  Service: Cardiovascular;  Laterality: Left;   AV FISTULA PLACEMENT Left 06/13/2017   Procedure: ARTERIOVENOUS (AV) FISTULA CREATION ( RADIAL CEPHALIC );  Surgeon: Katha Cabal, MD;  Location: [facility];  Service: Vascular;  Laterality: Left;   CATARACT EXTRACTION     one eye not sure which   DIALYSIS/PERMA CATHETER INSERTION N/A 04/03/2017   Procedure: Dialysis/Perma Catheter Insertion;  Surgeon: Algernon Huxley, MD;  Location: [place] [facility];  Service: Cardiovascular;  Laterality: N/A;   DIALYSIS/PERMA CATHETER INSERTION N/A 08/06/2017   Procedure: DIALYSIS/PERMA CATHETER INSERTION;  Surgeon: Algernon Huxley, MD;  Location: [place] [facility];  Service: Cardiovascular;  Laterality: N/A;    DIALYSIS/PERMA CATHETER REMOVAL N/A 08/04/2017   Procedure: DIALYSIS/PERMA CATHETER REMOVAL;  Surgeon: Algernon Huxley, MD;  Location: [place] [facility];  Service: Cardiovascular;  Laterality: N/A;   DIALYSIS/PERMA CATHETER REMOVAL N/A 12/31/2017   Procedure: DIALYSIS/PERMA CATHETER REMOVAL;  Surgeon: Algernon Huxley, MD;  Location: [facility];  Service: Cardiovascular;  Laterality: N/A;   hernia repair     SACROPLASTY N/A 09/30/2018   Procedure: SACROPLASTY;  Surgeon: Hessie Knows, MD;  Location: [facility];  Service: Orthopedics;  Laterality: N/A;   UPPER EXTREMITY ANGIOGRAPHY Left 09/05/2017   Procedure: Upper Extremity Angiography;  Surgeon: Katha Cabal, MD;  Location: [place] [facility];  Service: Cardiovascular;  Laterality: Left;    Family History  Problem Relation Age of Onset   Hypertension Mother    Diabetes Neg Hx     Social History:  reports that he has never smoked. He has never used smokeless tobacco. He reports that he does not drink alcohol and does not use drugs.  Allergies: No Known Allergies  Medications: I have reviewed the patient's current medications.  Results for orders placed or performed during the hospital encounter of 10/25/21 (from the past 48 hour(s))  Glucose, capillary     Status: Abnormal   Collection Time: 10/26/21  8:47 PM  Result Value Ref Range   Glucose-Capillary 112 (H) 70 - 99 mg/dL    Comment: Glucose reference range applies only to samples taken after fasting for at least  8 hours.  CBC     Status: Abnormal   Collection Time: 10/27/21  4:21 AM  Result Value Ref Range   WBC 6.0 4.0 - 10.5 K/uL   RBC 2.63 (L) 4.22 - 5.81 MIL/uL   Hemoglobin 8.3 (L) 13.0 - 17.0 g/dL   HCT 26.1 (L) 39.0 - 52.0 %   MCV 99.2 80.0 - 100.0 fL   MCH 31.6 26.0 - 34.0 pg   MCHC 31.8 30.0 - 36.0 g/dL   RDW 15.2 11.5 - 15.5 %   Platelets 160 150 - 400 K/uL   nRBC 0.0 0.0 - 0.2 %    Comment: Performed at [facility], [address]  [place], [place] [address]  Renal function panel     Status: Abnormal   Collection Time: 10/27/21  4:21 AM  Result Value Ref Range   Sodium 135 135 - 145 mmol/L   Potassium 4.3 3.5 - 5.1 mmol/L   Chloride 99 98 - 111 mmol/L   CO2 25 22 - 32 mmol/L   Glucose, Bld 119 (H) 70 - 99 mg/dL    Comment: Glucose reference range applies only to samples taken after fasting for at least 8 hours.   BUN 80 (H) 8 - 23 mg/dL   Creatinine, Ser 9.00 (H) 0.61 - 1.24 mg/dL   Calcium 8.3 (L) 8.9 - 10.3 mg/dL   Phosphorus 3.0 2.5 - 4.6 mg/dL   Albumin 2.7 (L) 3.5 - 5.0 g/dL   GFR, Estimated 5 (L) >60 mL/min    Comment: (NOTE) Calculated using the CKD-EPI Creatinine Equation (2021)    Anion gap 11 5 - 15    Comment: Performed at [facility], [address] [place], [place] [address]  Glucose, capillary     Status: Abnormal   Collection Time: 10/27/21  8:18 AM  Result Value Ref Range   Glucose-Capillary 189 (H) 70 - 99 mg/dL    Comment: Glucose reference range applies only to samples taken after fasting for at least 8 hours.  Renal function panel     Status: Abnormal   Collection Time: 10/27/21  8:38 AM  Result Value Ref Range   Sodium 135 135 - 145 mmol/L   Potassium 4.4 3.5 - 5.1 mmol/L   Chloride 98 98 - 111 mmol/L   CO2 24 22 - 32 mmol/L   Glucose, Bld 198 (H) 70 - 99 mg/dL    Comment: Glucose reference range applies only to samples taken after fasting for at least 8 hours.   BUN 80 (H) 8 - 23 mg/dL   Creatinine, Ser 9.57 (H) 0.61 - 1.24 mg/dL   Calcium 8.4 (L) 8.9 - 10.3 mg/dL   Phosphorus 3.1 2.5 - 4.6 mg/dL   Albumin 2.4 (L) 3.5 - 5.0 g/dL   GFR, Estimated 5 (L) >60 mL/min    Comment: (NOTE) Calculated using the CKD-EPI Creatinine Equation (2021)    Anion gap 13 5 - 15    Comment: Performed at [facility], [address] [place], [place] [address]  CBC     Status: Abnormal   Collection Time: 10/27/21  8:38 AM  Result Value Ref Range   WBC 5.6 4.0 - 10.5 K/uL   RBC  2.60 (L) 4.22 - 5.81 MIL/uL   Hemoglobin 8.5 (L) 13.0 - 17.0 g/dL   HCT 26.0 (L) 39.0 - 52.0 %   MCV 100.0 80.0 - 100.0 fL   MCH 32.7 26.0 - 34.0 pg   MCHC 32.7 30.0 -  36.0 g/dL   RDW 15.2 11.5 - 15.5 %   Platelets 162 150 - 400 K/uL   nRBC 0.0 0.0 - 0.2 %    Comment: Performed at [facility], [address] [place], [place] [address]  Hepatitis B surface antigen     Status: None   Collection Time: 10/27/21  8:38 AM  Result Value Ref Range   Hepatitis B Surface Ag NON REACTIVE NON REACTIVE    Comment: Performed at [facility] [address], [place], [place] [address]  Hepatitis B surface antibody     Status: None   Collection Time: 10/27/21  8:38 AM  Result Value Ref Range   Hep B S Ab NON REACTIVE NON REACTIVE    Comment: (NOTE) Inconsistent with immunity, less than 10 mIU/mL.  Performed at [facility], [place] [address], [place], [place] [address]   Uric acid     Status: None   Collection Time: 10/27/21  8:38 AM  Result Value Ref Range   Uric Acid, Serum 3.9 3.7 - 8.6 mg/dL    Comment: Performed at [facility], [address] [place], [place] [address]  Glucose, capillary     Status: Abnormal   Collection Time: 10/27/21  4:47 PM  Result Value Ref Range   Glucose-Capillary 232 (H) 70 - 99 mg/dL    Comment: Glucose reference range applies only to samples taken after fasting for at least 8 hours.  Glucose, capillary     Status: None   Collection Time: 10/27/21  8:58 PM  Result Value Ref Range   Glucose-Capillary 86 70 - 99 mg/dL    Comment: Glucose reference range applies only to samples taken after fasting for at least 8 hours.  Renal function panel     Status: Abnormal   Collection Time: 10/28/21  4:22 AM  Result Value Ref Range   Sodium 134 (L) 135 - 145 mmol/L   Potassium 3.6 3.5 - 5.1 mmol/L   Chloride 96 (L) 98 - 111 mmol/L   CO2 29 22 - 32 mmol/L   Glucose, Bld 108 (H) 70 - 99 mg/dL    Comment: Glucose reference range applies  only to samples taken after fasting for at least 8 hours.   BUN 48 (H) 8 - 23 mg/dL   Creatinine, Ser 6.36 (H) 0.61 - 1.24 mg/dL   Calcium 8.3 (L) 8.9 - 10.3 mg/dL   Phosphorus 2.6 2.5 - 4.6 mg/dL   Albumin 2.7 (L) 3.5 - 5.0 g/dL   GFR, Estimated 8 (L) >60 mL/min    Comment: (NOTE) Calculated using the CKD-EPI Creatinine Equation (2021)    Anion gap 9 5 - 15    Comment: Performed at [facility], [address] [place], [place] [address]  Glucose, capillary     Status: None   Collection Time: 10/28/21  7:28 AM  Result Value Ref Range   Glucose-Capillary 93 70 - 99 mg/dL    Comment: Glucose reference range applies only to samples taken after fasting for at least 8 hours.  Glucose, capillary     Status: Abnormal   Collection Time: 10/28/21 11:27 AM  Result Value Ref Range   Glucose-Capillary 177 (H) 70 - 99 mg/dL    Comment: Glucose reference range applies only to samples taken after fasting for at least 8 hours.  Glucose, capillary     Status: Abnormal   Collection Time: 10/28/21  3:36 PM  Result Value Ref Range  Glucose-Capillary 148 (H) 70 - 99 mg/dL    Comment: Glucose reference range applies only to samples taken after fasting for at least 8 hours.    No results found.  ROS Blood pressure (!) 127/59, pulse 64, temperature 98.9 F (37.2 C), temperature source Oral, resp. rate 16, height 5\' 5"  (1.651 m), weight 113.4 kg, SpO2 100 %.  Vitals:   10/28/21 0727 10/28/21 1541  BP: (!) 121/48 (!) 127/59  Pulse: 62 64  Resp: 12 16  Temp: 99 F (37.2 C) 98.9 F (37.2 C)  SpO2: 95% 100%    General AA&O x3. Normal mood and affect.  Vascular Dorsalis pedis and posterior tibial pulses  present 1+ left  Capillary refill normal to all digits. Pedal hair growth normal.  Neurologic Epicritic sensation grossly present.  Dermatologic (Wound) Skin warm dry well-perfused  Orthopedic: Pain with plantarflexion resistance and on the insertion of the Achilles tendon, intact  negative Thompson test    Assessment/Plan:  Achilles tendinitis versus strain -Imaging: Studies independently reviewed.  No need for MRI at this point -Would recommend physical therapy work with him at rehab on Achilles rehab protocol with eccentric exercises -WBAT in CAM boot as needed -We will sign off at this point he can follow-up with [place] as an outpatient in a few weeks if he is not improving, please call with questions or concerns  Criselda Peaches 10/28/2021, 1130AM  Best available via secure chat for questions or concerns.

## 2021-10-28 NOTE — Progress Notes (Signed)
[facility]  PROGRESS NOTE   Subjective:   Feels much better.  He ate well today. Had stable dialysis yesterday.  Objective:  Vital signs in last 24 hours:  Temp:  [97.9 F (36.6 C)-99 F (37.2 C)] 99 F (37.2 C) (11/13 0727) Pulse Rate:  [62-73] 62 (11/13 0727) Resp:  [12-18] 12 (11/13 0727) BP: (121-146)/(40-65) 121/48 (11/13 0727) SpO2:  [95 %-99 %] 95 % (11/13 0727)  Weight change:  Filed Weights   10/25/21 1943 10/25/21 1944 10/27/21 0920  Weight: 72 kg 75 kg 113.4 kg    Intake/Output: I/O last 3 completed shifts: In: -  Out: 2000 [Other:2000]   Intake/Output this shift:  No intake/output data recorded.  Physical Exam: General:  No acute distress  Head:  Normocephalic, atraumatic. Moist oral mucosal membranes  Eyes:  Anicteric  Neck:  Supple  Lungs:   Clear to auscultation, normal effort  Heart:  S1S2 no rubs  Abdomen:   Soft, nontender, bowel sounds present  Extremities:  peripheral edema.  Neurologic:  Awake, alert, following commands  Skin:  No lesions  Access:     Basic Metabolic Panel: Recent Labs  Lab 10/25/21 2015 10/26/21 0436 10/27/21 0421 10/27/21 0838 10/28/21 0422  NA 135 137 135 135 134*  K 3.7 3.9 4.3 4.4 3.6  CL 99 100 99 98 96*  CO2 25 25 25 24 29   GLUCOSE 118* 99 119* 198* 108*  BUN 60* 62* 80* 80* 48*  CREATININE 6.27* 7.26* 9.00* 9.57* 6.36*  CALCIUM 8.3* 8.2* 8.3* 8.4* 8.3*  PHOS  --   --  3.0 3.1 2.6    CBC: Recent Labs  Lab 10/25/21 2015 10/26/21 0436 10/27/21 0421 10/27/21 0838  WBC 8.1 7.5 6.0 5.6  NEUTROABS 6.0  --   --   --   HGB 8.8* 8.5* 8.3* 8.5*  HCT 27.3* 26.0* 26.1* 26.0*  MCV 100.0 100.4* 99.2 100.0  PLT 151 153 160 162     Urinalysis: No results for input(s): COLORURINE, LABSPEC, PHURINE, GLUCOSEU, HGBUR, BILIRUBINUR, KETONESUR, PROTEINUR, UROBILINOGEN, NITRITE, LEUKOCYTESUR in the last 72 hours.  Invalid input(s): APPERANCEUR    Imaging: No results found.   Medications:      allopurinol  100 mg Oral Daily   atorvastatin  20 mg Oral QPM   feeding supplement (NEPRO CARB STEADY)  237 mL Oral TID BM   heparin injection (subcutaneous)  5,000 Units Subcutaneous Q8H   hydrALAZINE  50 mg Oral TID   insulin aspart  0-15 Units Subcutaneous TID WC   insulin aspart  0-5 Units Subcutaneous QHS   lactulose  20 g Oral BID   metoprolol succinate  25 mg Oral Daily   multivitamin  1 tablet Oral QHS   pantoprazole  40 mg Oral Daily   [START ON 10/29/2021] predniSONE  20 mg Oral Q breakfast   sodium bicarbonate  650 mg Oral Daily   tamsulosin  0.4 mg Oral QPC supper    Assessment/ Plan:     Principal Problem:   Generalized weakness Active Problems:   Hypertension   ESRD on hemodialysis ([name])   Left foot pain   Anemia of chronic kidney failure, stage 5 ([place])   Diabetes mellitus, type II ([place])   History of recent fall   Hyperammonemia ([name])   Hepatic cirrhosis ([name])   Inadequate oral intake   Malnutrition of moderate degree  77 male with history of diabetes, peripheral vascular disease, hypertension, cirrhosis, secondary hyperparathyroidism, end-stage renal disease on dialysis  with anemia now admitted with history of left foot and ankle pain.  #1: ESRD: Had stable dialysis treatment yesterday.  #2: Hypertension: Continue present antihypertensive medications.  #3: Anemia: We will continue to monitor hemoglobin closely.  We will follow anemia protocols.  #4: Metabolic alkalosis: Patient has been on sodium bicarbonate orally.  We will discontinue the same.  #5: Secondary hyperparathyroidism: We will continue to monitor closely.  #6: Foot pain: Was given prednisone.  Pain seems to be improved.  Discharge planning to rehab.   LOS: [place], [facility] 11/13/20221:53 PM

## 2021-10-28 NOTE — Progress Notes (Signed)
PROGRESS NOTE    Martin Gilbert  WSF:[id number] DOB: 02-28-1941 DOA: 10/25/2021 PCP: [name], MD  Outpatient Specialists: nephrology    Brief Narrative:   Martin Gilbert is a 80 y.o. male with medical history significant for DM, HTN, ESRD on HD TTS, anemia of CKD, hepatic cirrhosis, who was sent to the ED from dialysis because of generalized weakness and left foot pain that prevents him from ambulating.  Patient fell a week ago injuring his left ankle and has been unable to ambulate on the foot since.  Patient at baseline ambulates independently and takes care of his wife and the home.  He states he overall feels very weak and has no appetite.  He denies chest pain, shortness of breath, fever or chills.  Denies nausea or vomiting and denies diarrhea.  States his biggest problem is the pain in his left foot and ankle and the weakness he feels.  Patient states he has not been taking his medication due to his poor oral intake.   Assessment & Plan:   Principal Problem:   Generalized weakness Active Problems:   Hypertension   ESRD on hemodialysis ([name])   Left foot pain   Anemia of chronic kidney failure, stage 5 ([name])   Diabetes mellitus, type II ([place])   History of recent fall   Hyperammonemia ([name])   Hepatic cirrhosis ([name])   Inadequate oral intake   Malnutrition of moderate degree  # Left ankle pain Daughter reports trip and fall a week ago and since then complaining of left ankle pain and difficulty walking. On admission noted mild diffuse right ankle swelling and the ankle is warm. Most tender at achilles insertion site. X-ray without fracture. Patient denies history of gout BUT the patient is on allopurinol. Uric acid wnl. PVL neg for dvt. Today erythema resolved, swelling improved. Podiatry has seen, says exam today most consistent w/ achilles tendonitis - boot to be provided by podiatry - outpt PT - will taper prednisone. Received 40 this morning, will  write for 20 tomorrow morning - pt/ot consulted, advising snf - possible d/c to snf tomorrow  # Lethargy and confusion Daughter reports some recent lethargy and confusion. She reports patient self-discontinued lactulose so hepatic encephalopathy the most likely acute culprit, though patient with significant chronic medical conditions that no doubt contribute. Appears to have returned to baseline - cont lactulose - cirrhosis as below  # ESRD On tts dialysis, hasn't missed any recent dialysis - nephrology following, tolerating dialysis  # Cirrhosis Appears compensated. No documented hx of varices; I do not see report in our records or care record of endoscopy. Mild hepatic encephalopathy as above - continue lactulose, titrate as needed  # HTN Here bp mild elevation - home hydral, metop  # Anemia on ckd Hgb stable in 8s - monitor, likely epo w/ dialysis  # T2DM Sugars wnl, not on meds - SSI while on steroids  # BPH - cont flomax  DVT prophylaxis: heparin Code Status: full Family Communication: daughter updated telephonically 11/13  Level of care: Med-Surg Status is: Inpatient  Remains inpatient appropriate because: unsafe d/c plan, ongoing w/u   Consultants:  Podiatry, nephrology  Procedures: none  Antimicrobials:  none    Subjective:  Ankle pain resolved. No confusion.   Objective: Vitals:   10/27/21 1649 10/27/21 2044 10/28/21 0351 10/28/21 0727  BP: (!) 146/65 (!) 124/43 (!) 121/40 (!) 121/48  Pulse: 73 63 73 62  Resp: 12 18 18 12   Temp: 98.9 F (  37.2 C) 98.4 F (36.9 C) 97.9 F (36.6 C) 99 F (37.2 C)  TempSrc: Oral Oral Oral Oral  SpO2: 97% 99% 97% 95%  Weight:      Height:        Intake/Output Summary (Last 24 hours) at 10/28/2021 1234 Last data filed at 10/27/2021 1330 Gross per 24 hour  Intake --  Output 2000 ml  Net -2000 ml   Filed Weights   10/25/21 1943 10/25/21 1944 10/27/21 0920  Weight: 72 kg 75 kg 113.4 kg     Examination:  General exam: Appears calm and comfortable  Respiratory system: Clear to auscultation. Respiratory effort normal. Cardiovascular system: S1 & S2 heard, RRR. No JVD, murmurs, rubs, gallops or clicks. No pedal edema. Gastrointestinal system: Abdomen is mildly distended, soft and nontender. No organomegaly or masses felt. Normal bowel sounds heard. Central nervous system: Alert and oriented. No focal neurological deficits. Extremities: Symmetric 5 x 5 power. Skin: No rashes, lesions or ulcers Psychiatry: Judgement and insight appear normal. Mood & affect appropriate.  Msk: right ankle swelling resolved. Mild ttp achilles insertion    Data Reviewed: I have personally reviewed following labs and imaging studies  CBC: Recent Labs  Lab 10/25/21 2015 10/26/21 0436 10/27/21 0421 10/27/21 0838  WBC 8.1 7.5 6.0 5.6  NEUTROABS 6.0  --   --   --   HGB 8.8* 8.5* 8.3* 8.5*  HCT 27.3* 26.0* 26.1* 26.0*  MCV 100.0 100.4* 99.2 100.0  PLT 151 153 160 287   Basic Metabolic Panel: Recent Labs  Lab 10/25/21 2015 10/26/21 0436 10/27/21 0421 10/27/21 0838 10/28/21 0422  NA 135 137 135 135 134*  K 3.7 3.9 4.3 4.4 3.6  CL 99 100 99 98 96*  CO2 25 25 25 24 29   GLUCOSE 118* 99 119* 198* 108*  BUN 60* 62* 80* 80* 48*  CREATININE 6.27* 7.26* 9.00* 9.57* 6.36*  CALCIUM 8.3* 8.2* 8.3* 8.4* 8.3*  PHOS  --   --  3.0 3.1 2.6   GFR: Estimated Creatinine Clearance: 10.6 mL/min (A) (by C-G formula based on SCr of 6.36 mg/dL (H)). Liver Function Tests: Recent Labs  Lab 10/25/21 2015 10/26/21 0436 10/27/21 0421 10/27/21 0838 10/28/21 0422  AST 52* 41  --   --   --   ALT 27 26  --   --   --   ALKPHOS 327* 298*  --   --   --   BILITOT 1.2 1.1  --   --   --   PROT 6.7 6.5  --   --   --   ALBUMIN 2.9* 2.6* 2.7* 2.4* 2.7*   No results for input(s): LIPASE, AMYLASE in the last 168 hours. Recent Labs  Lab 10/25/21 2213 10/26/21 0436  AMMONIA 50* 71*   Coagulation  Profile: Recent Labs  Lab 10/25/21 2016  INR 1.2   Cardiac Enzymes: Recent Labs  Lab 10/25/21 2015  CKTOTAL 86   BNP (last 3 results) No results for input(s): PROBNP in the last 8760 hours. HbA1C: Recent Labs    10/25/21 2015  HGBA1C 4.8   CBG: Recent Labs  Lab 10/27/21 0818 10/27/21 1647 10/27/21 2058 10/28/21 0728 10/28/21 1127  GLUCAP 189* 232* 86 93 177*   Lipid Profile: No results for input(s): CHOL, HDL, LDLCALC, TRIG, CHOLHDL, LDLDIRECT in the last 72 hours. Thyroid Function Tests: No results for input(s): TSH, T4TOTAL, FREET4, T3FREE, THYROIDAB in the last 72 hours. Anemia Panel: No results for input(s): VITAMINB12, FOLATE, FERRITIN,  TIBC, IRON, RETICCTPCT in the last 72 hours. Urine analysis:    Component Value Date/Time   COLORURINE RED (A) 07/20/2020 1851   APPEARANCEUR TURBID (A) 07/20/2020 1851   APPEARANCEUR Turbid 08/29/2013 2049   LABSPEC 1.020 07/20/2020 1851   LABSPEC 1.015 08/29/2013 2049   PHURINE  07/20/2020 1851    TEST NOT REPORTED DUE TO COLOR INTERFERENCE OF URINE PIGMENT   GLUCOSEU (A) 07/20/2020 1851    TEST NOT REPORTED DUE TO COLOR INTERFERENCE OF URINE PIGMENT   GLUCOSEU 50 mg/dL 08/29/2013 2049   HGBUR (A) 07/20/2020 1851    TEST NOT REPORTED DUE TO COLOR INTERFERENCE OF URINE PIGMENT   BILIRUBINUR (A) 07/20/2020 1851    TEST NOT REPORTED DUE TO COLOR INTERFERENCE OF URINE PIGMENT   BILIRUBINUR Negative 08/29/2013 2049   KETONESUR (A) 07/20/2020 1851    TEST NOT REPORTED DUE TO COLOR INTERFERENCE OF URINE PIGMENT   PROTEINUR (A) 07/20/2020 1851    TEST NOT REPORTED DUE TO COLOR INTERFERENCE OF URINE PIGMENT   NITRITE (A) 07/20/2020 1851    TEST NOT REPORTED DUE TO COLOR INTERFERENCE OF URINE PIGMENT   LEUKOCYTESUR (A) 07/20/2020 1851    TEST NOT REPORTED DUE TO COLOR INTERFERENCE OF URINE PIGMENT   LEUKOCYTESUR 3+ 08/29/2013 2049   Sepsis Labs: @LABRCNTIP (procalcitonin:4,lacticidven:4)  ) Recent Results (from the past  240 hour(s))  Resp Panel by RT-PCR (Flu A&B, Covid) Nasopharyngeal Swab     Status: None   Collection Time: 10/25/21  8:15 PM   Specimen: Nasopharyngeal Swab; Nasopharyngeal(NP) swabs in vial transport medium  Result Value Ref Range Status   SARS Coronavirus 2 by RT PCR NEGATIVE NEGATIVE Final    Comment: (NOTE) SARS-CoV-2 target nucleic acids are NOT DETECTED.  The SARS-CoV-2 RNA is generally detectable in upper respiratory specimens during the acute phase of infection. The lowest concentration of SARS-CoV-2 viral copies this assay can detect is 138 copies/mL. A negative result does not preclude SARS-Cov-2 infection and should not be used as the sole basis for treatment or other patient management decisions. A negative result may occur with  improper specimen collection/handling, submission of specimen other than nasopharyngeal swab, presence of viral mutation(s) within the areas targeted by this assay, and inadequate number of viral copies(<138 copies/mL). A negative result must be combined with clinical observations, patient history, and epidemiological information. The expected result is Negative.  Fact Sheet for Patients:  EntrepreneurPulse.com.au  Fact Sheet for Healthcare Providers:  IncredibleEmployment.be  This test is no t yet approved or cleared by the [place] FDA and  has been authorized for detection and/or diagnosis of SARS-CoV-2 by FDA under an Emergency Use Authorization (EUA). This EUA will remain  in effect (meaning this test can be used) for the duration of the COVID-19 declaration under Section 564(b)(1) of the Act, 21 U.S.C.section 360bbb-3(b)(1), unless the authorization is terminated  or revoked sooner.       Influenza A by PCR NEGATIVE NEGATIVE Final   Influenza B by PCR NEGATIVE NEGATIVE Final    Comment: (NOTE) The Xpert Xpress SARS-CoV-2/FLU/RSV plus assay is intended as an aid in the diagnosis of influenza  from Nasopharyngeal swab specimens and should not be used as a sole basis for treatment. Nasal washings and aspirates are unacceptable for Xpert Xpress SARS-CoV-2/FLU/RSV testing.  Fact Sheet for Patients: EntrepreneurPulse.com.au  Fact Sheet for Healthcare Providers: IncredibleEmployment.be  This test is not yet approved or cleared by the [place] FDA and has been authorized for detection and/or diagnosis of  SARS-CoV-2 by FDA under an Emergency Use Authorization (EUA). This EUA will remain in effect (meaning this test can be used) for the duration of the COVID-19 declaration under Section 564(b)(1) of the Act, 21 U.S.C. section 360bbb-3(b)(1), unless the authorization is terminated or revoked.  Performed at [facility], [address] [place],  [address]          Radiology Studies: No results found.      Scheduled Meds:  allopurinol  100 mg Oral Daily   atorvastatin  20 mg Oral QPM   feeding supplement (NEPRO CARB STEADY)  237 mL Oral TID BM   heparin injection (subcutaneous)  5,000 Units Subcutaneous Q8H   hydrALAZINE  50 mg Oral TID   insulin aspart  0-15 Units Subcutaneous TID WC   insulin aspart  0-5 Units Subcutaneous QHS   lactulose  20 g Oral BID   metoprolol succinate  25 mg Oral Daily   multivitamin  1 tablet Oral QHS   pantoprazole  40 mg Oral Daily   predniSONE  40 mg Oral Q breakfast   sodium bicarbonate  650 mg Oral Daily   tamsulosin  0.4 mg Oral QPC supper   Continuous Infusions:   LOS: 3 days    Time spent: 20 min    Desma Maxim, MD [facility]   If 7PM-7AM, please contact night-coverage [email or web address] Password [facility] 10/28/2021, 12:34 PM

## 2021-10-28 NOTE — TOC Progression Note (Signed)
Transition of Care [facility]) - Progression Note    Patient Details  Name: Martin Gilbert MRN: [id number] Date of Birth: 04-29-1941  Transition of Care [facility]) CM/SW [place], LCSW Phone Number: 10/28/2021, 1:07 PM  Clinical Narrative:   Asked RN and MD for COVID test in preparation for SNF tomorrow.  Asked [facility] at [facility] to confirm they can still take patient tomorrow.    Expected Discharge Plan: [place] Barriers to Discharge: Continued Medical Work up  Expected Discharge Plan and Services Expected Discharge Plan: [place] Choice: [place] arrangements for the past 2 months: Single Family Home                                       Social Determinants of Health (SDOH) Interventions    Readmission Risk Interventions Readmission Risk Prevention Plan 04/16/2021  Transportation Screening Complete  Home Care Screening Complete  Medication Review (RN CM) Complete  Some recent data might be hidden

## 2021-10-29 DIAGNOSIS — R531 Weakness: Secondary | ICD-10-CM | POA: Diagnosis not present

## 2021-10-29 LAB — RENAL FUNCTION PANEL
Albumin: 2.7 g/dL — ABNORMAL LOW (ref 3.5–5.0)
Anion gap: 12 (ref 5–15)
BUN: 71 mg/dL — ABNORMAL HIGH (ref 8–23)
CO2: 25 mmol/L (ref 22–32)
Calcium: 8.4 mg/dL — ABNORMAL LOW (ref 8.9–10.3)
Chloride: 93 mmol/L — ABNORMAL LOW (ref 98–111)
Creatinine, Ser: 8.33 mg/dL — ABNORMAL HIGH (ref 0.61–1.24)
GFR, Estimated: 6 mL/min — ABNORMAL LOW (ref >60)
Glucose, Bld: 141 mg/dL — ABNORMAL HIGH (ref 70–99)
Phosphorus: 2.3 mg/dL — ABNORMAL LOW (ref 2.5–4.6)
Potassium: 5 mmol/L (ref 3.5–5.1)
Sodium: 130 mmol/L — ABNORMAL LOW (ref 135–145)

## 2021-10-29 LAB — SARS CORONAVIRUS 2 (TAT 6-24 HRS): SARS Coronavirus 2: NEGATIVE

## 2021-10-29 LAB — GLUCOSE, CAPILLARY
Glucose-Capillary: 190 mg/dL — ABNORMAL HIGH (ref 70–99)
Glucose-Capillary: 170 mg/dL — ABNORMAL HIGH (ref 70–99)

## 2021-10-29 LAB — HEPATITIS B SURFACE ANTIBODY, QUANTITATIVE: Hep B S AB Quant (Post): 3.3 m[IU]/mL — ABNORMAL LOW (ref >9.9)

## 2021-10-29 MED ORDER — ALLOPURINOL 100 MG PO TABS
50.0000 mg | ORAL_TABLET | Freq: Every day | ORAL | Status: DC
Start: 2021-10-29 — End: 2022-02-06

## 2021-10-29 MED ORDER — PREDNISONE 20 MG PO TABS: ORAL_TABLET | ORAL | Status: DC

## 2021-10-29 NOTE — TOC Transition Note (Signed)
Transition of Care [facility]) - CM/SW Discharge Note   Patient Details  Name: Martin Gilbert MRN: [id number] Date of Birth: Jul 02, 1941  [facility] CM/SW Contact:  Kerin Salen, RN Phone Number: 10/29/2021, 9:02 AM   Clinical Narrative:  Patient to discharge to [facility] room 603-B. Nurse to calll report to [phone]. Daughter notified of discharge voices understanding.     Final next level of care: Skilled Nursing Facility Barriers to Discharge: Barriers Resolved   Patient Goals and CMS Choice        Discharge Placement              Patient chooses bed at: [facility] [place] Patient to be transferred to facility by: [place] Name of family member notified: Daughter Rosemarie Ax Patient and family notified of of transfer: 10/29/21  Discharge Plan and Services     Post Acute Care Choice: [place]                               Social Determinants of Health (SDOH) Interventions     Readmission Risk Interventions Readmission Risk Prevention Plan 04/16/2021  Transportation Screening Complete  Home Care Screening Complete  Medication Review (RN CM) Complete  Some recent data might be hidden

## 2021-10-29 NOTE — Discharge Summary (Signed)
[name] LNL:[id number] DOB: 1941-06-19 DOA: 10/25/2021  PCP: [name], MD  Admit date: 10/25/2021 Discharge date: 10/29/2021  Time spent: 35 minutes  Recommendations for Outpatient Follow-up:  Consider podiatry f/u if foot pain persists Will need prednisone taper for a total of 5 more days Eccentric PT exercises for achilles tendonopathy  Repeat uric acid level 6 wks    Discharge Diagnoses:  Principal Problem:   Generalized weakness Active Problems:   Hypertension   ESRD on hemodialysis ([place])   Left foot pain   Anemia of chronic kidney failure, stage 5 ([place])   Diabetes mellitus, type II ([place])   History of recent fall   Hyperammonemia ([name])   Hepatic cirrhosis ([name])   Inadequate oral intake   Malnutrition of moderate degree   Achilles tendinitis, left leg   Discharge Condition: stable  Diet recommendation: low sodium renal diet  Filed Weights   10/25/21 1943 10/25/21 1944 10/27/21 0920  Weight: 72 kg 75 kg 113.4 kg    History of present illness:  Martin Gilbert is a 80 y.o. male with medical history significant for DM, HTN, ESRD on HD TTS, anemia of CKD, hepatic cirrhosis, who was sent to the ED from dialysis because of generalized weakness and left foot pain that prevents him from ambulating.  Patient fell a week ago injuring his left ankle and has been unable to ambulate on the foot since.  Patient at baseline ambulates independently and takes care of his wife and the home.  He states he overall feels very weak and has no appetite.  He denies chest pain, shortness of breath, fever or chills.  Denies nausea or vomiting and denies diarrhea.  States his biggest problem is the pain in his left foot and ankle and the weakness he feels.  Patient states he has not been taking his medication due to his poor oral intake.  Hospital Course:   # Left ankle pain Daughter reports trip and fall a week ago and since then complaining of left ankle pain  and difficulty walking. On admission noted mild diffuse right ankle swelling and the ankle is warm. Most tender at achilles insertion site. X-ray without fracture. Patient denies history of gout BUT the patient is on allopurinol. Uric acid wnl. PVL neg for dvt. Now erythema resolved, swelling improved. Podiatry has seen, says exam most consistent w/ achilles tendonitis - WBAT in cam boot - eccentric PT exercises - podiatry f/u as needed - prednisone taper - uric acid wnl, will decrease allopurinol dose to 50, will need re-check of uric acid level in 6 weeks   # Lethargy and confusion Daughter reports some recent lethargy and confusion. She reports patient self-discontinued lactulose so hepatic encephalopathy the most likely acute culprit, though patient with significant chronic medical conditions that no doubt contribute. Appears to have returned to baseline - cont lactulose   # ESRD On tts dialysis, hasn't missed any recent dialysis. Tts schedule - nephrology following, tolerated dialysis here   # Cirrhosis Appears compensated. No documented hx of varices; I do not see report in our records or care record of endoscopy. Mild hepatic encephalopathy as above - continued lactulose   # HTN Here bp mild elevation - home hydral, metop   # Anemia on ckd Hgb stable in 8s   # T2DM Sugars wnl, not on meds - SSI while on steroids   # BPH - cont flomax  Procedures: hemodialysis   Consultations: Podiatry, nephrology  Discharge Exam: Vitals:   10/29/21  0421 10/29/21 0744  BP: (!) 127/43 (!) 121/44  Pulse: (!) 57 64  Resp: 18 16  Temp: 98.4 F (36.9 C) 98.1 F (36.7 C)  SpO2: 100% 98%    General exam: Appears calm and comfortable  Respiratory system: Clear to auscultation. Respiratory effort normal. Cardiovascular system: S1 & S2 heard, RRR. No JVD, murmurs, rubs, gallops or clicks. No pedal edema. Gastrointestinal system: Abdomen is mildly distended, soft and nontender. No  organomegaly or masses felt. Normal bowel sounds heard. Central nervous system: Alert and oriented. No focal neurological deficits. Extremities: Symmetric 5 x 5 power. Skin: No rashes, lesions or ulcers Psychiatry: Judgement and insight appear normal. Mood & affect appropriate.  Msk: right ankle swelling resolved. Mild ttp achilles insertion  Discharge Instructions   Discharge Instructions     Diet - low sodium heart healthy   Complete by: As directed    Increase activity slowly   Complete by: As directed       Allergies as of 10/29/2021   No Known Allergies      Medication List     TAKE these medications    acetaminophen 325 MG tablet Commonly known as: TYLENOL Take 2 tablets (650 mg total) by mouth every 6 (six) hours as needed for mild pain (or Fever >/= 101).   allopurinol 100 MG tablet Commonly known as: ZYLOPRIM Take 0.5 tablets (50 mg total) by mouth daily. What changed: how much to take   atorvastatin 20 MG tablet Commonly known as: LIPITOR Take 20 mg by mouth daily.   famotidine 20 MG tablet Commonly known as: PEPCID Take 1 tablet (20 mg total) by mouth daily.   ferrous sulfate 325 (65 FE) MG tablet Take 325 mg by mouth 2 (two) times daily with a meal.   hydrALAZINE 50 MG tablet Commonly known as: APRESOLINE Take 50 mg by mouth 3 (three) times daily.   lactulose 10 GM/15ML solution Commonly known as: CHRONULAC Take 20 g (30 mL) 3-4 times a day for a goal of at least 2 bowel movements a day.   lidocaine-prilocaine cream Commonly known as: EMLA Apply 1 application topically daily as needed.   metoprolol succinate 25 MG 24 hr tablet Commonly known as: TOPROL-XL Take 25 mg by mouth daily.   multivitamin Tabs tablet Take 1 tablet by mouth at bedtime.   ondansetron 4 MG disintegrating tablet Commonly known as: Zofran ODT Take 1 tablet (4 mg total) by mouth every 8 (eight) hours as needed for nausea or vomiting.   pantoprazole 40 MG  tablet Commonly known as: PROTONIX Take 40 mg by mouth daily.   predniSONE 20 MG tablet Commonly known as: DELTASONE 20 mg daily for 2 more days, then 10 mg daily for 3 days   sodium bicarbonate 650 MG tablet Take 1 tablet (650 mg total) by mouth daily.   tamsulosin 0.4 MG Caps capsule Commonly known as: FLOMAX Take 1 capsule (0.4 mg total) by mouth daily after supper.   Vitamin D (Cholecalciferol) 25 MCG (1000 UT) Tabs Take 1 tablet by mouth daily.       No Known Allergies  Follow-up Information     Criselda Peaches, DPM Follow up.   Specialty: Podiatry Why: As needed Contact information: [place] [address] [phone]                  The results of significant diagnostics from this hospitalization (including imaging, microbiology, ancillary and laboratory) are listed below for reference.  Significant Diagnostic Studies: DG Ankle Complete Left  Result Date: 10/25/2021 CLINICAL DATA:  Status post fall EXAM: LEFT ANKLE COMPLETE - 3+ VIEW COMPARISON:  None. FINDINGS: Diffusely decreased bone density. There is no evidence of fracture, dislocation, or joint effusion. There is no evidence of arthropathy or other focal bone abnormality. Diffuse subcutaneus soft tissue edema. Vascular calcifications. IMPRESSION: 1. No acute displaced fracture or dislocation. 2. Diffusely decreased bone density. Electronically Signed   By: Iven Finn M.D.   On: 10/25/2021 20:33   US Venous Img Lower Unilateral Left  Result Date: 10/25/2021 CLINICAL DATA:  Pain and swelling EXAM: Left LOWER EXTREMITY VENOUS DOPPLER ULTRASOUND TECHNIQUE: Gray-scale sonography with compression, as well as color and duplex ultrasound, were performed to evaluate the deep venous system(s) from the level of the common femoral vein through the popliteal and proximal calf veins. COMPARISON:  None. FINDINGS: VENOUS Normal compressibility of the common femoral, superficial femoral, and  popliteal veins, as well as the visualized calf veins. Visualized portions of profunda femoral vein and great saphenous vein unremarkable. No filling defects to suggest DVT on grayscale or color Doppler imaging. Doppler waveforms show normal direction of venous flow, normal respiratory plasticity and response to augmentation. Limited views of the contralateral common femoral vein are unremarkable. OTHER None. Limitations: none IMPRESSION: No deep venous thrombosis left lower extremity. Electronically Signed   By: Iven Finn M.D.   On: 10/25/2021 21:33    Microbiology: Recent Results (from the past 240 hour(s))  Resp Panel by RT-PCR (Flu A&B, Covid) Nasopharyngeal Swab     Status: None   Collection Time: 10/25/21  8:15 PM   Specimen: Nasopharyngeal Swab; Nasopharyngeal(NP) swabs in vial transport medium  Result Value Ref Range Status   SARS Coronavirus 2 by RT PCR NEGATIVE NEGATIVE Final    Comment: (NOTE) SARS-CoV-2 target nucleic acids are NOT DETECTED.  The SARS-CoV-2 RNA is generally detectable in upper respiratory specimens during the acute phase of infection. The lowest concentration of SARS-CoV-2 viral copies this assay can detect is 138 copies/mL. A negative result does not preclude SARS-Cov-2 infection and should not be used as the sole basis for treatment or other patient management decisions. A negative result may occur with  improper specimen collection/handling, submission of specimen other than nasopharyngeal swab, presence of viral mutation(s) within the areas targeted by this assay, and inadequate number of viral copies(<138 copies/mL). A negative result must be combined with clinical observations, patient history, and epidemiological information. The expected result is Negative.  Fact Sheet for Patients:  EntrepreneurPulse.com.au  Fact Sheet for Healthcare Providers:  IncredibleEmployment.be  This test is no t yet approved or  cleared by the [place] FDA and  has been authorized for detection and/or diagnosis of SARS-CoV-2 by FDA under an Emergency Use Authorization (EUA). This EUA will remain  in effect (meaning this test can be used) for the duration of the COVID-19 declaration under Section 564(b)(1) of the Act, 21 U.S.C.section 360bbb-3(b)(1), unless the authorization is terminated  or revoked sooner.       Influenza A by PCR NEGATIVE NEGATIVE Final   Influenza B by PCR NEGATIVE NEGATIVE Final    Comment: (NOTE) The Xpert Xpress SARS-CoV-2/FLU/RSV plus assay is intended as an aid in the diagnosis of influenza from Nasopharyngeal swab specimens and should not be used as a sole basis for treatment. Nasal washings and aspirates are unacceptable for Xpert Xpress SARS-CoV-2/FLU/RSV testing.  Fact Sheet for Patients: EntrepreneurPulse.com.au  Fact Sheet for Healthcare Providers: IncredibleEmployment.be  This test is not yet approved or cleared by the [place] and has been authorized for detection and/or diagnosis of SARS-CoV-2 by FDA under an Emergency Use Authorization (EUA). This EUA will remain in effect (meaning this test can be used) for the duration of the COVID-19 declaration under Section 564(b)(1) of the Act, 21 U.S.C. section 360bbb-3(b)(1), unless the authorization is terminated or revoked.  Performed at [facility], [place], [place] [address]   SARS CORONAVIRUS 2 (TAT 6-24 HRS) Nasopharyngeal Nasopharyngeal Swab     Status: None   Collection Time: 10/28/21  1:13 PM   Specimen: Nasopharyngeal Swab  Result Value Ref Range Status   SARS Coronavirus 2 NEGATIVE NEGATIVE Final    Comment: (NOTE) SARS-CoV-2 target nucleic acids are NOT DETECTED.  The SARS-CoV-2 RNA is generally detectable in upper and lower respiratory specimens during the acute phase of infection. Negative results do not preclude SARS-CoV-2  infection, do not rule out co-infections with other pathogens, and should not be used as the sole basis for treatment or other patient management decisions. Negative results must be combined with clinical observations, patient history, and epidemiological information. The expected result is Negative.  Fact Sheet for Patients: SugarRoll.be  Fact Sheet for Healthcare Providers: https://www.woods-mathews.com/  This test is not yet approved or cleared by the [place] FDA and  has been authorized for detection and/or diagnosis of SARS-CoV-2 by FDA under an Emergency Use Authorization (EUA). This EUA will remain  in effect (meaning this test can be used) for the duration of the COVID-19 declaration under Se ction 564(b)(1) of the Act, 21 U.S.C. section 360bbb-3(b)(1), unless the authorization is terminated or revoked sooner.  Performed at [facility], [place] [address] [place], [place] [address]      Labs: Basic Metabolic Panel: Recent Labs  Lab 10/26/21 0436 10/27/21 0421 10/27/21 0838 10/28/21 0422 10/29/21 0531  NA 137 135 135 134* 130*  K 3.9 4.3 4.4 3.6 5.0  CL 100 99 98 96* 93*  CO2 25 25 24 29 25   GLUCOSE 99 119* 198* 108* 141*  BUN 62* 80* 80* 48* 71*  CREATININE 7.26* 9.00* 9.57* 6.36* 8.33*  CALCIUM 8.2* 8.3* 8.4* 8.3* 8.4*  PHOS  --  3.0 3.1 2.6 2.3*   Liver Function Tests: Recent Labs  Lab 10/25/21 2015 10/26/21 0436 10/27/21 0421 10/27/21 0838 10/28/21 0422 10/29/21 0531  AST 52* 41  --   --   --   --   ALT 27 26  --   --   --   --   ALKPHOS 327* 298*  --   --   --   --   BILITOT 1.2 1.1  --   --   --   --   PROT 6.7 6.5  --   --   --   --   ALBUMIN 2.9* 2.6* 2.7* 2.4* 2.7* 2.7*   No results for input(s): LIPASE, AMYLASE in the last 168 hours. Recent Labs  Lab 10/25/21 2213 10/26/21 0436  AMMONIA 50* 71*   CBC: Recent Labs  Lab 10/25/21 2015 10/26/21 0436 10/27/21 0421 10/27/21 0838   WBC 8.1 7.5 6.0 5.6  NEUTROABS 6.0  --   --   --   HGB 8.8* 8.5* 8.3* 8.5*  HCT 27.3* 26.0* 26.1* 26.0*  MCV 100.0 100.4* 99.2 100.0  PLT 151 153 160 162   Cardiac Enzymes: Recent Labs  Lab 10/25/21 2015  CKTOTAL 86   BNP:  BNP (last 3 results) No results for input(s): BNP in the last 8760 hours.  ProBNP (last 3 results) No results for input(s): PROBNP in the last 8760 hours.  CBG: Recent Labs  Lab 10/28/21 0728 10/28/21 1127 10/28/21 1536 10/28/21 2115 10/29/21 0744  GLUCAP 93 177* 148* 183* 190*       Signed:  Desma Maxim MD.  [facility] 10/29/2021, 9:20 AM

## 2021-10-29 NOTE — Care Management Important Message (Signed)
Important Message  Patient Details  Name: Martin Gilbert MRN: [id number] Date of Birth: 1941-04-14   Medicare Important Message Given:  Yes     Juliann Pulse A Jaisen Wiltrout 10/29/2021, 12:33 PM

## 2021-10-29 NOTE — Progress Notes (Signed)
Patient discharged to [facility].  Report called to Carly who verbalized acceptance and understanding.  Packet placed for transport.  CAM boot given to patient and reviewed discharge instructions. PIV removed. Awaiting EMS transport.

## 2021-10-29 NOTE — Plan of Care (Signed)
Discharged to [facility] via EMS

## 2021-12-09 ENCOUNTER — Inpatient Hospital Stay
Admission: EM | Admit: 2021-12-09 | Discharge: 2021-12-11 | DRG: 291 | Disposition: A | Discharge status: Home Health | Payer: Medicare Other | Attending: Internal Medicine | Admitting: Internal Medicine

## 2021-12-09 ENCOUNTER — Emergency Department: Payer: Medicare Other

## 2021-12-09 ENCOUNTER — Other Ambulatory Visit: Payer: Self-pay

## 2021-12-09 DIAGNOSIS — I5033 Acute on chronic diastolic (congestive) heart failure: Secondary | ICD-10-CM | POA: Diagnosis present

## 2021-12-09 DIAGNOSIS — N400 Enlarged prostate without lower urinary tract symptoms: Secondary | ICD-10-CM | POA: Diagnosis present

## 2021-12-09 DIAGNOSIS — N2581 Secondary hyperparathyroidism of renal origin: Secondary | ICD-10-CM | POA: Diagnosis present

## 2021-12-09 DIAGNOSIS — K219 Gastro-esophageal reflux disease without esophagitis: Secondary | ICD-10-CM | POA: Diagnosis present

## 2021-12-09 DIAGNOSIS — D631 Anemia in chronic kidney disease: Secondary | ICD-10-CM | POA: Diagnosis present

## 2021-12-09 DIAGNOSIS — E8770 Fluid overload, unspecified: Secondary | ICD-10-CM | POA: Diagnosis present

## 2021-12-09 DIAGNOSIS — J810 Acute pulmonary edema: Secondary | ICD-10-CM | POA: Diagnosis not present

## 2021-12-09 DIAGNOSIS — Z79899 Other long term (current) drug therapy: Secondary | ICD-10-CM

## 2021-12-09 DIAGNOSIS — M159 Polyosteoarthritis, unspecified: Secondary | ICD-10-CM | POA: Diagnosis present

## 2021-12-09 DIAGNOSIS — Z8249 Family history of ischemic heart disease and other diseases of the circulatory system: Secondary | ICD-10-CM | POA: Diagnosis not present

## 2021-12-09 DIAGNOSIS — E1122 Type 2 diabetes mellitus with diabetic chronic kidney disease: Secondary | ICD-10-CM | POA: Diagnosis present

## 2021-12-09 DIAGNOSIS — E785 Hyperlipidemia, unspecified: Secondary | ICD-10-CM | POA: Diagnosis present

## 2021-12-09 DIAGNOSIS — I132 Hypertensive heart and chronic kidney disease with heart failure and with stage 5 chronic kidney disease, or end stage renal disease: Secondary | ICD-10-CM | POA: Diagnosis present

## 2021-12-09 DIAGNOSIS — N186 End stage renal disease: Secondary | ICD-10-CM | POA: Diagnosis present

## 2021-12-09 DIAGNOSIS — R059 Cough, unspecified: Secondary | ICD-10-CM | POA: Diagnosis not present

## 2021-12-09 DIAGNOSIS — M109 Gout, unspecified: Secondary | ICD-10-CM | POA: Diagnosis present

## 2021-12-09 DIAGNOSIS — Z20822 Contact with and (suspected) exposure to covid-19: Secondary | ICD-10-CM | POA: Diagnosis present

## 2021-12-09 DIAGNOSIS — Z992 Dependence on renal dialysis: Secondary | ICD-10-CM | POA: Diagnosis not present

## 2021-12-09 DIAGNOSIS — W19XXXA Unspecified fall, initial encounter: Secondary | ICD-10-CM

## 2021-12-09 DIAGNOSIS — R531 Weakness: Secondary | ICD-10-CM

## 2021-12-09 LAB — COMPREHENSIVE METABOLIC PANEL
ALT: 22 U/L (ref 0–44)
AST: 34 U/L (ref 15–41)
Albumin: 2.5 g/dL — ABNORMAL LOW (ref 3.5–5.0)
Alkaline Phosphatase: 276 U/L — ABNORMAL HIGH (ref 38–126)
Anion gap: 11 (ref 5–15)
BUN: 61 mg/dL — ABNORMAL HIGH (ref 8–23)
CO2: 24 mmol/L (ref 22–32)
Calcium: 8.6 mg/dL — ABNORMAL LOW (ref 8.9–10.3)
Chloride: 103 mmol/L (ref 98–111)
Creatinine, Ser: 7.56 mg/dL — ABNORMAL HIGH (ref 0.61–1.24)
GFR, Estimated: 7 mL/min — ABNORMAL LOW (ref >60)
Glucose, Bld: 108 mg/dL — ABNORMAL HIGH (ref 70–99)
Potassium: 4.6 mmol/L (ref 3.5–5.1)
Sodium: 138 mmol/L (ref 135–145)
Total Bilirubin: 1.4 mg/dL — ABNORMAL HIGH (ref 0.3–1.2)
Total Protein: 7.2 g/dL (ref 6.5–8.1)

## 2021-12-09 LAB — CBC WITH DIFFERENTIAL/PLATELET
Abs Immature Granulocytes: 0.02 10*3/uL (ref 0.00–0.07)
Basophils Absolute: 0 10*3/uL (ref 0.0–0.1)
Basophils Relative: 0 %
Eosinophils Absolute: 0.1 10*3/uL (ref 0.0–0.5)
Eosinophils Relative: 1 %
HCT: 27 % — ABNORMAL LOW (ref 39.0–52.0)
Hemoglobin: 8.7 g/dL — ABNORMAL LOW (ref 13.0–17.0)
Immature Granulocytes: 0 %
Lymphocytes Relative: 19 %
Lymphs Abs: 1.2 10*3/uL (ref 0.7–4.0)
MCH: 31.6 pg (ref 26.0–34.0)
MCHC: 32.2 g/dL (ref 30.0–36.0)
MCV: 98.2 fL (ref 80.0–100.0)
Monocytes Absolute: 0.5 10*3/uL (ref 0.1–1.0)
Monocytes Relative: 8 %
Neutro Abs: 4.4 10*3/uL (ref 1.7–7.7)
Neutrophils Relative %: 72 %
Platelets: 204 10*3/uL (ref 150–400)
RBC: 2.75 MIL/uL — ABNORMAL LOW (ref 4.22–5.81)
RDW: 16.2 % — ABNORMAL HIGH (ref 11.5–15.5)
WBC: 6.2 10*3/uL (ref 4.0–10.5)
nRBC: 0 % (ref 0.0–0.2)

## 2021-12-09 LAB — CBC
HCT: 26.8 % — ABNORMAL LOW (ref 39.0–52.0)
Hemoglobin: 8.6 g/dL — ABNORMAL LOW (ref 13.0–17.0)
MCH: 31.5 pg (ref 26.0–34.0)
MCHC: 32.1 g/dL (ref 30.0–36.0)
MCV: 98.2 fL (ref 80.0–100.0)
Platelets: 212 10*3/uL (ref 150–400)
RBC: 2.73 MIL/uL — ABNORMAL LOW (ref 4.22–5.81)
RDW: 16.4 % — ABNORMAL HIGH (ref 11.5–15.5)
WBC: 6.1 10*3/uL (ref 4.0–10.5)
nRBC: 0 % (ref 0.0–0.2)

## 2021-12-09 LAB — CREATININE, SERUM
Creatinine, Ser: 7.82 mg/dL — ABNORMAL HIGH (ref 0.61–1.24)
GFR, Estimated: 6 mL/min — ABNORMAL LOW (ref >60)

## 2021-12-09 LAB — LACTIC ACID, PLASMA: Lactic Acid, Venous: 0.8 mmol/L (ref 0.5–1.9)

## 2021-12-09 LAB — TROPONIN I (HIGH SENSITIVITY)
Troponin I (High Sensitivity): 26 ng/L — ABNORMAL HIGH (ref <18)
Troponin I (High Sensitivity): 28 ng/L — ABNORMAL HIGH (ref <18)

## 2021-12-09 LAB — AMMONIA: Ammonia: 76 umol/L — ABNORMAL HIGH (ref 9–35)

## 2021-12-09 LAB — RESP PANEL BY RT-PCR (FLU A&B, COVID) ARPGX2
Influenza A by PCR: NEGATIVE
Influenza B by PCR: NEGATIVE
SARS Coronavirus 2 by RT PCR: NEGATIVE

## 2021-12-09 LAB — BRAIN NATRIURETIC PEPTIDE: B Natriuretic Peptide: 1213.4 pg/mL — ABNORMAL HIGH (ref 0.0–100.0)

## 2021-12-09 MED ORDER — ALLOPURINOL 100 MG PO TABS
50.0000 mg | ORAL_TABLET | Freq: Every day | ORAL | Status: DC
  Administered 2021-12-09 – 2021-12-11 (×3): 50 mg via ORAL
  Filled 2021-12-09 (×3): qty 0.5

## 2021-12-09 MED ORDER — DOCUSATE SODIUM 100 MG PO CAPS
100.0000 mg | ORAL_CAPSULE | Freq: Two times a day (BID) | ORAL | Status: DC
  Administered 2021-12-09 – 2021-12-11 (×3): 100 mg via ORAL
  Filled 2021-12-09 (×4): qty 1

## 2021-12-09 MED ORDER — GUAIFENESIN 100 MG/5ML PO LIQD
10.0000 mL | ORAL | Status: DC | PRN
  Administered 2021-12-10 – 2021-12-11 (×2): 10 mL via ORAL
  Filled 2021-12-09 (×3): qty 10

## 2021-12-09 MED ORDER — METOPROLOL SUCCINATE ER 50 MG PO TB24
25.0000 mg | ORAL_TABLET | Freq: Every day | ORAL | Status: DC
  Administered 2021-12-09: 19:00:00 25 mg via ORAL
  Filled 2021-12-09 (×4): qty 1

## 2021-12-09 MED ORDER — HYDRALAZINE HCL 50 MG PO TABS
50.0000 mg | ORAL_TABLET | Freq: Three times a day (TID) | ORAL | Status: DC
  Administered 2021-12-09 – 2021-12-10 (×2): 50 mg via ORAL
  Filled 2021-12-09 (×4): qty 1

## 2021-12-09 MED ORDER — TAMSULOSIN HCL 0.4 MG PO CAPS
0.4000 mg | ORAL_CAPSULE | Freq: Every day | ORAL | Status: DC
  Administered 2021-12-09 – 2021-12-10 (×2): 0.4 mg via ORAL
  Filled 2021-12-09 (×2): qty 1

## 2021-12-09 MED ORDER — SODIUM BICARBONATE 650 MG PO TABS: 650.0000 mg | ORAL_TABLET | Freq: Every day | ORAL | Status: DC

## 2021-12-09 MED ORDER — ACETAMINOPHEN 325 MG PO TABS
650.0000 mg | ORAL_TABLET | Freq: Four times a day (QID) | ORAL | Status: DC | PRN
  Administered 2021-12-10: 10:00:00 650 mg via ORAL
  Filled 2021-12-09: qty 2

## 2021-12-09 MED ORDER — HEPARIN SODIUM (PORCINE) 5000 UNIT/ML IJ SOLN
5000.0000 [IU] | Freq: Three times a day (TID) | INTRAMUSCULAR | Status: DC
  Administered 2021-12-09 – 2021-12-11 (×4): 5000 [IU] via SUBCUTANEOUS
  Filled 2021-12-09 (×4): qty 1

## 2021-12-09 MED ORDER — VITAMIN D 25 MCG (1000 UNIT) PO TABS
1000.0000 [IU] | ORAL_TABLET | Freq: Every day | ORAL | Status: DC
  Administered 2021-12-10 – 2021-12-11 (×2): 1000 [IU] via ORAL
  Filled 2021-12-09 (×3): qty 1

## 2021-12-09 MED ORDER — ATORVASTATIN CALCIUM 20 MG PO TABS
20.0000 mg | ORAL_TABLET | Freq: Every day | ORAL | Status: DC
  Administered 2021-12-09 – 2021-12-11 (×3): 20 mg via ORAL
  Filled 2021-12-09 (×4): qty 1

## 2021-12-09 MED ORDER — ONDANSETRON HCL 4 MG PO TABS: 4.0000 mg | ORAL_TABLET | Freq: Four times a day (QID) | ORAL | Status: DC | PRN

## 2021-12-09 MED ORDER — ACETAMINOPHEN 650 MG RE SUPP: 650.0000 mg | Freq: Four times a day (QID) | RECTAL | Status: DC | PRN

## 2021-12-09 MED ORDER — FERROUS SULFATE 325 (65 FE) MG PO TABS
325.0000 mg | ORAL_TABLET | Freq: Two times a day (BID) | ORAL | Status: DC
  Administered 2021-12-10 – 2021-12-11 (×2): 325 mg via ORAL
  Filled 2021-12-09 (×3): qty 1

## 2021-12-09 MED ORDER — PANTOPRAZOLE SODIUM 40 MG PO TBEC
40.0000 mg | DELAYED_RELEASE_TABLET | Freq: Every day | ORAL | Status: DC
  Administered 2021-12-09 – 2021-12-11 (×3): 40 mg via ORAL
  Filled 2021-12-09 (×4): qty 1

## 2021-12-09 MED ORDER — ONDANSETRON HCL 4 MG/2ML IJ SOLN: 4.0000 mg | Freq: Four times a day (QID) | INTRAMUSCULAR | Status: DC | PRN

## 2021-12-09 MED ORDER — ALBUTEROL SULFATE (2.5 MG/3ML) 0.083% IN NEBU: 2.5000 mg | INHALATION_SOLUTION | Freq: Four times a day (QID) | RESPIRATORY_TRACT | Status: DC | PRN

## 2021-12-09 MED ORDER — POLYETHYLENE GLYCOL 3350 17 G PO PACK: 17.0000 g | PACK | Freq: Every day | ORAL | Status: DC | PRN

## 2021-12-09 NOTE — ED Triage Notes (Signed)
Pt comes into the ED via EMS from home, persistent cough for the past 3 weeks, generalized weakness.. was admitted to he hospital 12/10 with weakness

## 2021-12-09 NOTE — ED Notes (Signed)
Pt requesting something for their cough. MD Joellyn Quails at this time.

## 2021-12-09 NOTE — ED Provider Notes (Signed)
[facility] Emergency Department Provider Note  ____________________________________________   Event Date/Time   First MD Initiated Contact with Patient 12/09/21 1336     (approximate)  I have reviewed the triage vital signs and the nursing notes.   HISTORY  Chief Complaint Hip Pain    HPI Martin Gilbert is a 80 y.o. male here with generalized weakness and hip pain.  History is somewhat limited secondary to patient's age, language barrier though interpreter was used.  Patient reports that he is here because he feels weak.  Per family report to EMS, the patient has been increasingly weak, unable to move, and has been complaining of more left leg pain.  Of note, the patient was just seen and evaluated for left leg pain as well as lethargy and confusion, which was thought to be multifactorial secondary to pain as well as possible hepatic encephalopathy.  Patient was sent home with treatment for possible gout of the leg and referred to physical therapy.  He has been doing this and states that the pain has persisted.  He now essentially states that he is unable to walk due to weakness and leg pain.  He states that he has had difficulty taking care of himself.  He also states that he has had cough and increasing shortness of breath over the last several days to week.  He believes he has been compliant with his dialysis but does not necessarily know when the last session was.  He is on a Tuesday Thursday Saturday schedule.    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[place]) 10/25/2021   Diabetes mellitus, type II ([place]) 10/25/2021   History of recent fall 10/25/2021   Hyperammonemia ([place]) 10/25/2021   Hepatic cirrhosis ([place]) 10/25/2021   Inadequate oral intake 10/25/2021   Norovirus 04/15/2021   Hypotension    Dehydration    Hypomagnesemia    Acute colitis 35/57/3220   Acute metabolic encephalopathy 25/42/7062   Acute gastroenteritis 10/11/2018   Intractable nausea and vomiting 10/10/2018   Sacral fracture ([place]) 37/62/8315   Complication of arteriovenous dialysis fistula 09/01/2017   ESRD on hemodialysis ([place]) 06/03/2017   Colitis 03/27/2017   Demand ischemia ([place]) 03/27/2017   Enteritis due to Clostridium difficile    Protein-calorie malnutrition, severe 02/18/2017   C. 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/31/2015   Bone lesion 06/05/2015   Primary osteoarthritis of both knees 06/05/2015   Pain in the chest 02/15/2015   Neck pain 02/15/2015   Diabetes type 2, uncontrolled 02/15/2015   Frequent headaches 02/15/2015   Type 2 diabetes mellitus with hyperglycemia ([place]) 02/15/2015   Hypertrophy of prostate with urinary obstruction and other lower urinary tract symptoms (LUTS) 09/07/2013    Past Surgical History:  Procedure Laterality Date   A/V  FISTULAGRAM Left 04/20/2018   Procedure: A/V FISTULAGRAM;  Surgeon: Algernon Huxley, MD;  Location: [place] [facility];  Service: Cardiovascular;  Laterality: Left;   A/V FISTULAGRAM Left 11/25/2018   Procedure: A/V FISTULAGRAM;  Surgeon: Algernon Huxley, MD;  Location: [place] [facility];  Service: Cardiovascular;  Laterality: Left;   A/V FISTULAGRAM Left 01/11/2019   Procedure: A/V FISTULAGRAM;  Surgeon: Algernon Huxley, MD;  Location: [place] [facility];  Service: Cardiovascular;  Laterality: Left;   AV FISTULA PLACEMENT Left 06/13/2017   Procedure: ARTERIOVENOUS (AV) FISTULA CREATION ( RADIAL CEPHALIC );  Surgeon: Katha Cabal, MD;  Location: [facility];  Service: Vascular;  Laterality: Left;   CATARACT EXTRACTION     one eye not sure which   DIALYSIS/PERMA CATHETER INSERTION N/A 04/03/2017   Procedure: Dialysis/Perma Catheter Insertion;  Surgeon: Algernon Huxley, MD;  Location: [place] [facility];  Service: Cardiovascular;  Laterality: N/A;   DIALYSIS/PERMA CATHETER INSERTION N/A 08/06/2017   Procedure: DIALYSIS/PERMA CATHETER INSERTION;  Surgeon: Algernon Huxley, MD;  Location: [place] [facility];  Service: Cardiovascular;  Laterality: N/A;   DIALYSIS/PERMA CATHETER REMOVAL N/A 08/04/2017   Procedure: DIALYSIS/PERMA CATHETER REMOVAL;  Surgeon: Algernon Huxley, MD;  Location: [place] [facility];  Service: Cardiovascular;  Laterality: N/A;   DIALYSIS/PERMA CATHETER REMOVAL N/A 12/31/2017   Procedure: DIALYSIS/PERMA CATHETER REMOVAL;  Surgeon: Algernon Huxley, MD;  Location: [place] [facility];  Service: Cardiovascular;  Laterality: N/A;   hernia repair     SACROPLASTY N/A 09/30/2018   Procedure: SACROPLASTY;  Surgeon: Hessie Knows, MD;  Location: [facility];  Service: Orthopedics;  Laterality: N/A;   UPPER EXTREMITY ANGIOGRAPHY Left 09/05/2017   Procedure: Upper Extremity Angiography;  Surgeon: Katha Cabal, MD;  Location: [place] [facility];  Service: Cardiovascular;  Laterality: Left;     Prior to Admission medications   Medication Sig Start Date End Date Taking? Authorizing Provider  allopurinol (ZYLOPRIM) 100 MG tablet Take 0.5 tablets (50 mg total) by mouth daily. 10/29/21  Yes Wouk, Ailene Rud, MD  pantoprazole (PROTONIX) 40 MG tablet Take 40 mg by mouth daily. 10/10/21  Yes [provider]  tamsulosin (FLOMAX) 0.4 MG CAPS capsule Take 1 capsule (0.4 mg total) by mouth daily after supper. 02/19/17  Yes Epifanio Lesches, MD  acetaminophen (TYLENOL) 325 MG tablet Take 2 tablets (650 mg total) by mouth every 6 (six) hours as needed for mild pain (or Fever >/= 101). 11/28/17   Gouru, Illene Silver, MD  atorvastatin (LIPITOR) 20 MG tablet Take 20 mg by mouth daily.    [provider]  famotidine (PEPCID) 20 MG tablet Take 1 tablet (20 mg total) by mouth daily. 04/16/21 04/16/22  Eugenie Filler, MD  ferrous sulfate 325 (65 FE) MG tablet Take 325 mg by mouth 2 (two) times daily with a meal.  08/30/15   [provider]  hydrALAZINE (APRESOLINE) 50 MG tablet Take 50 mg by mouth 3 (three) times daily.  [provider]  lactulose (CHRONULAC) 10 GM/15ML solution Take 20 g (30 mL) 3-4 times a day for a goal of at least 2 bowel movements a day. 04/28/21   Eugenie Filler, MD  metoprolol succinate (TOPROL-XL) 25 MG 24 hr tablet Take 25 mg by mouth daily.    [provider]  multivitamin (RENA-VIT) TABS tablet Take 1 tablet by mouth at bedtime. Patient not taking: Reported on 04/16/2021 10/14/18   MayoPete Pelt, MD  ondansetron (ZOFRAN ODT) 4 MG disintegrating tablet Take 1 tablet (4 mg total) by mouth every 8 (eight) hours as needed for nausea or vomiting. 06/15/21   Naaman Plummer, MD  predniSONE (DELTASONE) 20 MG tablet 20 mg daily for 2 more days, then 10 mg daily for 3 days Patient not taking: Reported on 12/09/2021 10/29/21   Gwynne Edinger, MD  sodium bicarbonate 650 MG tablet Take 1 tablet (650 mg total) by mouth daily. Patient not  taking: Reported on 12/09/2021 04/17/21   Eugenie Filler, MD  Vitamin D, Cholecalciferol, 1000 UNITS TABS Take 1 tablet by mouth daily.     [provider]    Allergies Patient has no known allergies.  Family History  Problem Relation Age of Onset   Hypertension Mother    Diabetes Neg Hx     Social History Social History   Tobacco Use   Smoking status: Never   Smokeless tobacco: Never  Vaping Use   Vaping Use: Never used  Substance Use Topics   Alcohol use: No    Comment: Drank in the past, but has stopped for several years.    Drug use: No    Review of Systems  Review of Systems  Unable to perform ROS: Mental status change  Constitutional:  Positive for fatigue. Negative for chills and fever.  HENT:  Negative for sore throat.   Respiratory:  Positive for cough and shortness of breath.   Cardiovascular:  Negative for chest pain.  Gastrointestinal:  Negative for abdominal pain.  Genitourinary:  Negative for flank pain.  Musculoskeletal:  Negative for neck pain.  Skin:  Negative for rash and wound.  Allergic/Immunologic: Negative for immunocompromised state.  Neurological:  Positive for weakness. Negative for numbness.  Hematological:  Does not bruise/bleed easily.  All other systems reviewed and are negative.   ____________________________________________  PHYSICAL EXAM:      VITAL SIGNS: ED Triage Vitals  Enc Vitals Group     BP 12/09/21 1309 (!) 165/64     Pulse Rate 12/09/21 1309 83     Resp 12/09/21 1309 18     Temp 12/09/21 1309 98.9 F (37.2 C)     Temp Source 12/09/21 1309 Oral     SpO2 12/09/21 1309 95 %     Weight 12/09/21 1317 200 lb (90.7 kg)     Height 12/09/21 1317 5\' 5"  (1.651 m)     Head Circumference --      Peak Flow --      Pain Score 12/09/21 1317 10     Pain Loc --      Pain Edu? --      Excl. in [place]? --      Physical Exam Vitals and nursing note reviewed.  Constitutional:      General: He is not in acute distress.     Appearance: He is well-developed.  HENT:     Head: Normocephalic and atraumatic.  Eyes:     Conjunctiva/sclera: Conjunctivae normal.  Cardiovascular:  Rate and Rhythm: Normal rate and regular rhythm.     Heart sounds: Normal heart sounds.  Pulmonary:     Effort: Pulmonary effort is normal. No respiratory distress.     Breath sounds: Decreased air movement present. Examination of the left-lower field reveals rhonchi. Decreased breath sounds and rhonchi present. No wheezing.  Abdominal:     General: There is no distension.  Musculoskeletal:     Cervical back: Neck supple.     Right lower leg: Edema present.     Left lower leg: Edema present.  Skin:    General: Skin is warm.     Capillary Refill: Capillary refill takes less than 2 seconds.     Findings: No rash.  Neurological:     Mental Status: He is alert. He is disoriented.     Sensory: Sensation is intact.     Motor: Motor function is intact. No abnormal muscle tone.      ____________________________________________   LABS (all labs ordered are listed, but only abnormal results are displayed)  Labs Reviewed  CBC WITH DIFFERENTIAL/PLATELET - Abnormal; Notable for the following components:      Result Value   RBC 2.75 (*)    Hemoglobin 8.7 (*)    HCT 27.0 (*)    RDW 16.2 (*)    All other components within normal limits  COMPREHENSIVE METABOLIC PANEL - Abnormal; Notable for the following components:   Glucose, Bld 108 (*)    BUN 61 (*)    Creatinine, Ser 7.56 (*)    Calcium 8.6 (*)    Albumin 2.5 (*)    Alkaline Phosphatase 276 (*)    Total Bilirubin 1.4 (*)    GFR, Estimated 7 (*)    All other components within normal limits  BRAIN NATRIURETIC PEPTIDE - Abnormal; Notable for the following components:   B Natriuretic Peptide 1,213.4 (*)    All other components within normal limits  BLOOD GAS, VENOUS - Abnormal; Notable for the following components:   pCO2, Ven 37 (*)    pO2, Ven 65.0 (*)    All other  components within normal limits  AMMONIA - Abnormal; Notable for the following components:   Ammonia 76 (*)    All other components within normal limits  TROPONIN I (HIGH SENSITIVITY) - Abnormal; Notable for the following components:   Troponin I (High Sensitivity) 26 (*)    All other components within normal limits  RESP PANEL BY RT-PCR (FLU A&B, COVID) ARPGX2  LACTIC ACID, PLASMA  URINALYSIS, ROUTINE W REFLEX MICROSCOPIC  CBC  CREATININE, SERUM  TROPONIN I (HIGH SENSITIVITY)    ____________________________________________  EKG: Normal sinus rhythm, ventricular rate 75.  PR 221, QRS 135, QTc 531.  No acute ST elevations or depressions per negative and subacute ischemic infarct. ________________________________________  RADIOLOGY All imaging, including plain films, CT scans, and ultrasounds, independently reviewed by me, and interpretations confirmed via formal radiology reads.  ED MD interpretation:   Chest x-ray: Findings consistent with pulmonary edema and atelectasis X-ray knee left: Negative X-ray hip left: Negative, no acute fracture  Official radiology report(s): DG Chest 1 View  Result Date: 12/09/2021 CLINICAL DATA:  Weakness EXAM: CHEST  1 VIEW COMPARISON:  July 18, 2020 FINDINGS: The cardiomediastinal silhouette is unchanged and enlarged in contour.Biapical pleuroparenchymal thickening, similar comparison to prior. No pleural effusion. No pneumothorax. Mild diffuse reticulation versus or prominence. Scattered platelike opacities at the LEFT lung base consistent with atelectasis/scar. Visualized abdomen is unremarkable. IMPRESSION: Constellation of findings are  favored to reflect cardiomegaly with mild pulmonary edema and scattered atelectasis. Electronically Signed   By: Valentino Saxon M.D.   On: 12/09/2021 14:29   DG Knee 2 Views Left  Result Date: 12/09/2021 CLINICAL DATA:  fall with pain EXAM: LEFT KNEE - 1-2 VIEW COMPARISON:  May 27, 2015 FINDINGS:  Osteopenia. No acute fracture or dislocation. Cyst mild degenerative changes of the medial and lateral compartments. 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 No unexpected radiopaque foreign body. Vascular calcifications. No significant joint effusion. IMPRESSION: No acute fracture or dislocation. Electronically Signed   By: Valentino Saxon M.D.   On: 12/09/2021 14:27   DG Knee 2 Views Right  Result Date: 12/09/2021 CLINICAL DATA:  fall with pain EXAM: RIGHT KNEE - 1-2 VIEW COMPARISON:  None. FINDINGS: Osteopenia. No acute fracture or dislocation. Moderate to severe degenerative changes of the lateral compartment with subcortical sclerosis and joint space narrowing. Enthesopathic changes of the patellar tendon. No area of erosion or osseous destruction. No unexpected radiopaque foreign body. Vascular calcifications. No significant joint effusion. IMPRESSION: No acute fracture or dislocation. Electronically Signed   By: Valentino Saxon M.D.   On: 12/09/2021 14:25   DG HIP UNILAT WITH PELVIS 2-3 VIEWS LEFT  Result Date: 12/09/2021 CLINICAL DATA:  fall with pain EXAM: DG HIP (WITH OR WITHOUT PELVIS) 2-3V LEFT COMPARISON:  June 15, 2021 FINDINGS: Osteopenia. No acute fracture or dislocation. Mild degenerative changes of the LEFT hip. Unchanged high density material at bilateral sacral iliac joints. Evaluation of the sacrum is limited due to overlapping bowel contents. No unexpected radiopaque foreign body. Pelvic phleboliths and vascular calcifications. IMPRESSION: No definitive acute fracture or dislocation although evaluation is limited by osteopenia. If persistent concern for nondisplaced hip or pelvic fracture, recommend dedicated pelvic MRI. Electronically Signed   By: Valentino Saxon M.D.   On: 12/09/2021 14:23    ____________________________________________  PROCEDURES   Procedure(s) performed (including Critical  Care):  Procedures  ____________________________________________  INITIAL IMPRESSION / MDM / [place] / ED COURSE  As part of my medical decision making, I reviewed the following data within the [place] notes reviewed and incorporated, Old chart reviewed, Notes from prior ED visits, and [place] Controlled Substance Database       *Jamile Sivils was evaluated in Emergency Department on 12/09/2021 for the symptoms described in the history of present illness. He was evaluated in the context of the global COVID-19 pandemic, which necessitated consideration that the patient might be at risk for infection with the SARS-CoV-2 virus that causes COVID-19. Institutional protocols and algorithms that pertain to the evaluation of patients at risk for COVID-19 are in a state of rapid change based on information released by regulatory bodies including the CDC and federal and state organizations. These policies and algorithms were followed during the patient's care in the ED.  Some ED evaluations and interventions may be delayed as a result of limited staffing during the pandemic.*     Medical Decision Making:  80 yo M with PMHx ESRD, cirrhosis, here with generalized weakness and cough, SOB. Pt arrives confused, but with no focal deficits. He does demonstrate mild increased WOB and wheezing. CXR shows pulmonary edema/fluid overload - may benefit from fluid removal via HD. Otherwise, lab work pending. Plan to likely admit after labs, given age, weakness/inability to ambulate. Dr. Kerman Passey to f/u labs.  ____________________________________________  FINAL CLINICAL IMPRESSION(S) / ED DIAGNOSES  Final  diagnoses:  Generalized weakness  Acute pulmonary edema ([name])  ESRD on hemodialysis ([place])     MEDICATIONS GIVEN DURING THIS VISIT:  Medications  heparin injection 5,000 Units (has no administration in time range)  acetaminophen (TYLENOL) tablet 650 mg (has no  administration in time range)    Or  acetaminophen (TYLENOL) suppository 650 mg (has no administration in time range)  docusate sodium (COLACE) capsule 100 mg (has no administration in time range)  polyethylene glycol (MIRALAX / GLYCOLAX) packet 17 g (has no administration in time range)  ondansetron (ZOFRAN) tablet 4 mg (has no administration in time range)    Or  ondansetron (ZOFRAN) injection 4 mg (has no administration in time range)  albuterol (PROVENTIL) (2.5 MG/3ML) 0.083% nebulizer solution 2.5 mg (has no administration in time range)  allopurinol (ZYLOPRIM) tablet 50 mg (has no administration in time range)  atorvastatin (LIPITOR) tablet 20 mg (has no administration in time range)  hydrALAZINE (APRESOLINE) tablet 50 mg (has no administration in time range)  metoprolol succinate (TOPROL-XL) 24 hr tablet 25 mg (has no administration in time range)  pantoprazole (PROTONIX) EC tablet 40 mg (has no administration in time range)  sodium bicarbonate tablet 650 mg (has no administration in time range)  tamsulosin (FLOMAX) capsule 0.4 mg (has no administration in time range)  ferrous sulfate tablet 325 mg (has no administration in time range)  Vitamin D (Cholecalciferol) TABS 1 tablet (has no administration in time range)     ED Discharge Orders     None        Note:  This document was prepared using Dragon voice recognition software and may include unintentional dictation errors.   Duffy Bruce, MD 12/09/21 [phone]

## 2021-12-09 NOTE — ED Notes (Signed)
Patient assisted up to [facility]. Continent of small amount of stool. Assisted back to bed. No distress noted at this time.

## 2021-12-09 NOTE — ED Provider Notes (Signed)
----------------------------------------- °  3:04 PM on 12/09/2021 -----------------------------------------  Blood pressure (!) 165/64, pulse 83, temperature 98.9 F (37.2 C), temperature source Oral, resp. rate 18, height 5\' 5"  (1.651 m), weight 90.7 kg, SpO2 95 %.  Assuming care from Dr. Ellender Hose.  In short, Martin Gilbert is a 80 y.o. male with a chief complaint of Hip Pain .  Refer to the original H&P for additional details.  The current plan of care is to follow-up labs, anticipate admission for weakness and fluid overload.  ----------------------------------------- 4:22 PM on 12/09/2021 ----------------------------------------- Labs are unremarkable, case discussed with hospitalist for admission for generalized weakness and fluid overload.  Nephrology was notified and patient to be placed on the dialysis schedule.    Blake Divine, MD 12/09/21 [phone]

## 2021-12-09 NOTE — H&P (Signed)
[place] [facility] [place] NAME: Martin Gilbert    MR#:  [id number]  DATE OF BIRTH:  [birth year]  DATE OF ADMISSION:  12/09/2021  PRIMARY CARE PHYSICIAN: [name], MD   REQUESTING/REFERRING PHYSICIAN: dr Charna Archer  Patient coming from :home via EMS  NO family in the room Pt is a poor historian Via spanish video interpreter   CHIEF COMPLAINT:  My whole body hurts  HISTORY OF PRESENT ILLNESS:  Martin Gilbert  is a 80 y.o. male with a known history of end-stage renal disease on hemodialysis, hypertension, diabetes type II not on any meds, hyperlipidemia comes emergency room with complaints of whole body hurts. Patient is a very poor historian. No family at bedside. Complains of cough, whole body hurting and some shortness of breath.low grade fever at home per dter  In the ER patient had x-rays of his lower extremity shows no fracture. Positive for DJD. Chest x-ray show mild pulmonary edema/fluid overload.  Patient saturations are hundred percent on room air. White count is 6.2. BNP is 1213 lactic acid .8. Patient is being admitted with acute pulmonary edema/fluid overload/CHF acute on chronic diastolic in the setting of end-stage renal disease. PAST MEDICAL HISTORY:   Past Medical History:  Diagnosis Date   Anemia    Arthritis    Back pain    BPH (benign prostatic hyperplasia)    CKD (chronic kidney disease), stage IV ([name])    Clostridium difficile colitis    Diabetes mellitus without complication ([name])    GERD (gastroesophageal reflux disease)    HTN (hypertension)    Hyperlipidemia     PAST SURGICAL HISTOIRY:   Past Surgical History:  Procedure Laterality Date   A/V FISTULAGRAM Left 04/20/2018   Procedure: A/V FISTULAGRAM;  Surgeon: Algernon Huxley, MD;  Location: [place];  Service: Cardiovascular;  Laterality: Left;   A/V FISTULAGRAM Left 11/25/2018   Procedure: A/V FISTULAGRAM;  Surgeon: Algernon Huxley, MD;  Location: [place] [facility];  Service: Cardiovascular;  Laterality: Left;   A/V FISTULAGRAM Left 01/11/2019   Procedure: A/V FISTULAGRAM;  Surgeon: Algernon Huxley, MD;  Location: [place];  Service: Cardiovascular;  Laterality: Left;   AV FISTULA PLACEMENT Left 06/13/2017   Procedure: ARTERIOVENOUS (AV) FISTULA CREATION ( RADIAL CEPHALIC );  Surgeon: Katha Cabal, MD;  Location: [facility];  Service: Vascular;  Laterality: Left;   CATARACT EXTRACTION     one eye not sure which   DIALYSIS/PERMA CATHETER INSERTION N/A 04/03/2017   Procedure: Dialysis/Perma Catheter Insertion;  Surgeon: Algernon Huxley, MD;  Location: [place] [facility];  Service: Cardiovascular;  Laterality: N/A;   DIALYSIS/PERMA CATHETER INSERTION N/A 08/06/2017   Procedure: DIALYSIS/PERMA CATHETER INSERTION;  Surgeon: Algernon Huxley, MD;  Location: [facility] [facility];  Service: Cardiovascular;  Laterality: N/A;   DIALYSIS/PERMA CATHETER REMOVAL N/A 08/04/2017   Procedure: DIALYSIS/PERMA CATHETER REMOVAL;  Surgeon: Algernon Huxley, MD;  Location: [facility];  Service: Cardiovascular;  Laterality: N/A;   DIALYSIS/PERMA CATHETER REMOVAL N/A 12/31/2017   Procedure: DIALYSIS/PERMA CATHETER REMOVAL;  Surgeon: Algernon Huxley, MD;  Location: [place] [facility];  Service: Cardiovascular;  Laterality: N/A;   hernia repair     SACROPLASTY N/A 09/30/2018   Procedure: SACROPLASTY;  Surgeon: Hessie Knows, MD;  Location: [facility];  Service: Orthopedics;  Laterality: N/A;   UPPER EXTREMITY ANGIOGRAPHY Left 09/05/2017   Procedure: Upper Extremity Angiography;  Surgeon: Delana Meyer,  Dolores Lory, MD;  Location: [place] [facility];  Service: Cardiovascular;  Laterality: Left;    SOCIAL HISTORY:   Social History   Tobacco Use   Smoking status: Never   Smokeless tobacco: Never  Substance Use Topics   Alcohol use: No    Comment: Drank in the past, but has stopped for several years.     FAMILY HISTORY:   Family  History  Problem Relation Age of Onset   Hypertension Mother    Diabetes Neg Hx     DRUG ALLERGIES:  No Known Allergies  REVIEW OF SYSTEMS:  Review of Systems  Constitutional:  Negative for chills, fever and weight loss.  HENT:  Negative for ear discharge, ear pain and nosebleeds.   Eyes:  Negative for blurred vision, pain and discharge.  Respiratory:  Positive for cough and shortness of breath. Negative for sputum production, wheezing and stridor.   Cardiovascular:  Negative for chest pain, palpitations, orthopnea and PND.  Gastrointestinal:  Negative for abdominal pain, diarrhea, nausea and vomiting.  Genitourinary:  Negative for frequency and urgency.  Musculoskeletal:  Positive for back pain and joint pain.  Neurological:  Positive for weakness. Negative for sensory change, speech change and focal weakness.  Psychiatric/Behavioral:  Negative for depression and hallucinations. The patient is not nervous/anxious.     MEDICATIONS AT HOME:   Prior to Admission medications   Medication Sig Start Date End Date Taking? Authorizing Provider  acetaminophen (TYLENOL) 325 MG tablet Take 2 tablets (650 mg total) by mouth every 6 (six) hours as needed for mild pain (or Fever >/= 101). 11/28/17   [name], MD  allopurinol (ZYLOPRIM) 100 MG tablet Take 0.5 tablets (50 mg total) by mouth daily. 10/29/21   Wouk, Ailene Rud, MD  atorvastatin (LIPITOR) 20 MG tablet Take 20 mg by mouth daily.    [provider]  famotidine (PEPCID) 20 MG tablet Take 1 tablet (20 mg total) by mouth daily. 04/16/21 04/16/22  Eugenie Filler, MD  ferrous sulfate 325 (65 FE) MG tablet Take 325 mg by mouth 2 (two) times daily with a meal.  08/30/15   [provider]  hydrALAZINE (APRESOLINE) 50 MG tablet Take 50 mg by mouth 3 (three) times daily.    [provider]  lactulose (CHRONULAC) 10 GM/15ML solution Take 20 g (30 mL) 3-4 times a day for a goal of at least 2 bowel movements a day.  04/28/21   Eugenie Filler, MD  metoprolol succinate (TOPROL-XL) 25 MG 24 hr tablet Take 25 mg by mouth daily.    [provider]  multivitamin (RENA-VIT) TABS tablet Take 1 tablet by mouth at bedtime. Patient not taking: No sig reported 10/14/18   Mayo, Pete Pelt, MD  ondansetron (ZOFRAN ODT) 4 MG disintegrating tablet Take 1 tablet (4 mg total) by mouth every 8 (eight) hours as needed for nausea or vomiting. 06/15/21   Naaman Plummer, MD  pantoprazole (PROTONIX) 40 MG tablet Take 40 mg by mouth daily. 10/10/21   [provider]  predniSONE (DELTASONE) 20 MG tablet 20 mg daily for 2 more days, then 10 mg daily for 3 days 10/29/21   Wouk, Ailene Rud, MD  sodium bicarbonate 650 MG tablet Take 1 tablet (650 mg total) by mouth daily. 04/17/21   Eugenie Filler, MD  tamsulosin (FLOMAX) 0.4 MG CAPS capsule Take 1 capsule (0.4 mg total) by mouth daily after supper. 02/19/17   Epifanio Lesches, MD  Vitamin D, Cholecalciferol, 1000  UNITS TABS Take 1 tablet by mouth daily.     [provider]      VITAL SIGNS:  Blood pressure (!) 165/64, pulse 83, temperature 98.9 F (37.2 C), temperature source Oral, resp. rate 18, height 5\' 5"  (1.651 m), weight 90.7 kg, SpO2 95 %.  PHYSICAL EXAMINATION:  GENERAL:  80 y.o.-year-old patient lying in the bed with no acute distress. Chronically ill, debilitated EYES: Pupils equal, round, reactive to light and accommodation. No scleral icterus.  HEENT: Head atraumatic, normocephalic. Oropharynx and nasopharynx clear.  NECK:  Supple, no jugular venous distention. No thyroid enlargement, no tenderness.  LUNGS: decreased breath sounds bilaterally, no wheezing, rales,rhonchi or crepitation. No use of accessory muscles of respiration.  CARDIOVASCULAR: S1, S2 normal. No murmurs, rubs, or gallops.  ABDOMEN: Soft, nontender, nondistended. Bowel sounds present. No organomegaly or mass.  EXTREMITIES: No pedal edema, cyanosis, or clubbing.  DJD NEUROLOGIC: nonfocal. Generalized weakness.  PSYCHIATRIC: The patient is alert and awake SKIN: No obvious rash, lesion, or ulcer.   LABORATORY PANEL:   CBC Recent Labs  Lab 12/09/21 1507  WBC 6.2  HGB 8.7*  HCT 27.0*  PLT 204   ------------------------------------------------------------------------------------------------------------------  Chemistries  Recent Labs  Lab 12/09/21 1507  NA 138  K 4.6  CL 103  CO2 24  GLUCOSE 108*  BUN 61*  CREATININE 7.56*  CALCIUM 8.6*  AST 34  ALT 22  ALKPHOS 276*  BILITOT 1.4*   ------------------------------------------------------------------------------------------------------------------  Cardiac Enzymes No results for input(s): TROPONINI in the last 168 hours. ------------------------------------------------------------------------------------------------------------------  RADIOLOGY:  DG Chest 1 View  Result Date: 12/09/2021 CLINICAL DATA:  Weakness EXAM: CHEST  1 VIEW COMPARISON:  July 18, 2020 FINDINGS: The cardiomediastinal silhouette is unchanged and enlarged in contour.Biapical pleuroparenchymal thickening, similar comparison to prior. No pleural effusion. No pneumothorax. Mild diffuse reticulation versus or prominence. Scattered platelike opacities at the LEFT lung base consistent with atelectasis/scar. Visualized abdomen is unremarkable. IMPRESSION: Constellation of findings are favored to reflect cardiomegaly with mild pulmonary edema and scattered atelectasis. Electronically Signed   By: Valentino Saxon M.D.   On: 12/09/2021 14:29   DG Knee 2 Views Left  Result Date: 12/09/2021 CLINICAL DATA:  fall with pain EXAM: LEFT KNEE - 1-2 VIEW COMPARISON:  May 27, 2015 FINDINGS: Osteopenia. No acute fracture or dislocation. Cyst mild degenerative changes of the medial and lateral compartments. 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 No unexpected radiopaque  foreign body. Vascular calcifications. No significant joint effusion. IMPRESSION: No acute fracture or dislocation. Electronically Signed   By: Valentino Saxon M.D.   On: 12/09/2021 14:27   DG Knee 2 Views Right  Result Date: 12/09/2021 CLINICAL DATA:  fall with pain EXAM: RIGHT KNEE - 1-2 VIEW COMPARISON:  None. FINDINGS: Osteopenia. No acute fracture or dislocation. Moderate to severe degenerative changes of the lateral compartment with subcortical sclerosis and joint space narrowing. Enthesopathic changes of the patellar tendon. No area of erosion or osseous destruction. No unexpected radiopaque foreign body. Vascular calcifications. No significant joint effusion. IMPRESSION: No acute fracture or dislocation. Electronically Signed   By: Valentino Saxon M.D.   On: 12/09/2021 14:25   DG HIP UNILAT WITH PELVIS 2-3 VIEWS LEFT  Result Date: 12/09/2021 CLINICAL DATA:  fall with pain EXAM: DG HIP (WITH OR WITHOUT PELVIS) 2-3V LEFT COMPARISON:  June 15, 2021 FINDINGS: Osteopenia. No acute fracture or dislocation. Mild degenerative changes of the LEFT hip. Unchanged high density material at bilateral  sacral iliac joints. Evaluation of the sacrum is limited due to overlapping bowel contents. No unexpected radiopaque foreign body. Pelvic phleboliths and vascular calcifications. IMPRESSION: No definitive acute fracture or dislocation although evaluation is limited by osteopenia. If persistent concern for nondisplaced hip or pelvic fracture, recommend dedicated pelvic MRI. Electronically Signed   By: Valentino Saxon M.D.   On: 12/09/2021 14:23    EKG:    IMPRESSION AND PLAN:   Martin Gilbert  is a 80 y.o. male with a known history of end-stage renal disease on hemodialysis, hypertension, diabetes type II not on any meds, hyperlipidemia comes emergency room with complaints of whole body hurts. Patient is a very poor historian. No family at bedside.  Acute pulmonary edema/fluid overload acute  on chronic CHF diastolic end-stage renal disease on hemodialysis -- patient came in with increasing shortness of breath, generalized body ache, cough -- had dialysis 3  times this week--according to dter Angie -- BNP 1200, sats hundred percent on room air -- nephrology consultation with Dr. Juleen China to dialyzed tomorrow -- potassium stable -- echo ordered -- continue home meds which is beta-blockers and hydralazine --consider empiric abxs if cough does not improve after HD tomorrow  end-stage renal disease on hemodialysis -- nephrology consulted  generalized body aches, DJD, left foot pain -- patient's x-rays of the lower extremity are negative for any trauma/fracture -- he was evaluated by podiatry in November 2022 for left foot pain was found to have Achilles tendinitis-- took a course of prednisone -- physical therapy to see patient -- TOC for discharge planning  Hyperlipidemia -- continue statins  no family at bedside   Family Communication :dter [name] on the phone Consults :nephrology Code Status :FULL DVT prophylaxis :heparin Level of care: Telemetry Cardiac  TOTAL TIME TAKING CARE OF THIS PATIENT: 45 minutes.    Fritzi Mandes M.D  [facility]     CC: Primary care physician; [name], MD

## 2021-12-09 NOTE — ED Triage Notes (Signed)
Triage completed using interpreter (Spanish). Pt via [facility] c/o bilateral leg and feet pain and right hip pain. He says he fell about 15 days ago while going to the restroom, stating that the wheels of his walker got stuck and he lost his footing. He was supposed to be doing physical therapy in a clinic at the time but fell twice for the same reason. He has had difficulty performing ADLs since his fall. He rates his pain 10/10 at this time. Denies hitting his head. Unsure if he takes blood thinners.

## 2021-12-10 LAB — CBC
HCT: 27.5 % — ABNORMAL LOW (ref 39.0–52.0)
Hemoglobin: 8.8 g/dL — ABNORMAL LOW (ref 13.0–17.0)
MCH: 31.4 pg (ref 26.0–34.0)
MCHC: 32 g/dL (ref 30.0–36.0)
MCV: 98.2 fL (ref 80.0–100.0)
Platelets: 229 10*3/uL (ref 150–400)
RBC: 2.8 MIL/uL — ABNORMAL LOW (ref 4.22–5.81)
RDW: 16.4 % — ABNORMAL HIGH (ref 11.5–15.5)
WBC: 5.6 10*3/uL (ref 4.0–10.5)
nRBC: 0 % (ref 0.0–0.2)

## 2021-12-10 LAB — RENAL FUNCTION PANEL
Albumin: 2.6 g/dL — ABNORMAL LOW (ref 3.5–5.0)
Anion gap: 11 (ref 5–15)
BUN: 62 mg/dL — ABNORMAL HIGH (ref 8–23)
CO2: 23 mmol/L (ref 22–32)
Calcium: 8.4 mg/dL — ABNORMAL LOW (ref 8.9–10.3)
Chloride: 103 mmol/L (ref 98–111)
Creatinine, Ser: 7.64 mg/dL — ABNORMAL HIGH (ref 0.61–1.24)
GFR, Estimated: 7 mL/min — ABNORMAL LOW (ref >60)
Glucose, Bld: 95 mg/dL (ref 70–99)
Phosphorus: 2.9 mg/dL (ref 2.5–4.6)
Potassium: 4.6 mmol/L (ref 3.5–5.1)
Sodium: 137 mmol/L (ref 135–145)

## 2021-12-10 LAB — DIFFERENTIAL
Abs Immature Granulocytes: 0.02 10*3/uL (ref 0.00–0.07)
Basophils Absolute: 0 10*3/uL (ref 0.0–0.1)
Basophils Relative: 1 %
Eosinophils Absolute: 0.2 10*3/uL (ref 0.0–0.5)
Eosinophils Relative: 3 %
Immature Granulocytes: 0 %
Lymphocytes Relative: 32 %
Lymphs Abs: 1.8 10*3/uL (ref 0.7–4.0)
Monocytes Absolute: 0.5 10*3/uL (ref 0.1–1.0)
Monocytes Relative: 8 %
Neutro Abs: 3.1 10*3/uL (ref 1.7–7.7)
Neutrophils Relative %: 56 %

## 2021-12-10 LAB — HEPATITIS B SURFACE ANTIGEN: Hepatitis B Surface Ag: NONREACTIVE

## 2021-12-10 LAB — HEPATITIS B SURFACE ANTIBODY,QUALITATIVE: Hep B S Ab: NONREACTIVE

## 2021-12-10 MED ORDER — DOXYCYCLINE HYCLATE 100 MG PO TABS
100.0000 mg | ORAL_TABLET | Freq: Two times a day (BID) | ORAL | Status: DC
  Administered 2021-12-10 – 2021-12-11 (×2): 100 mg via ORAL
  Filled 2021-12-10 (×2): qty 1

## 2021-12-10 MED ORDER — EPOETIN ALFA 10000 UNIT/ML IJ SOLN
INTRAMUSCULAR | Status: AC
  Filled 2021-12-10: qty 1

## 2021-12-10 MED ORDER — EPOETIN ALFA 10000 UNIT/ML IJ SOLN
10000.0000 [IU] | INTRAMUSCULAR | Status: DC
  Administered 2021-12-10: 17:00:00 10000 [IU] via INTRAVENOUS
  Filled 2021-12-10: qty 1

## 2021-12-10 NOTE — TOC Initial Note (Addendum)
Transition of Care [facility]) - Initial/Assessment Note    Patient Details  Name: Martin Gilbert MRN: [id number] Date of Birth: 04-Dec-1941  Transition [facility]) CM/SW Contact:    Adelene Amas, [place] Phone Number: 12/10/2021, 12:57 PM  Clinical Narrative:                  Patient presents to [facility] ED with persistent cough and generalized weakness.  PT has recommended home health PT.  Patient will receive home health  PT/OT/SW and RN.  [facility] home health will contact patient's main contact, daughter,  Rosemarie Ax [phone].      Patient Goals and CMS Choice        Expected Discharge Plan and Services                                                Prior Living Arrangements/Services                       Activities of Daily Living      Permission Sought/Granted                  Emotional Assessment              Admission diagnosis:  Fluid overload [E87.70] Patient Active Problem List   Diagnosis Date Noted   Fluid overload 12/09/2021   Acute pulmonary edema ([name])    Cough    Achilles tendinitis, left leg    Malnutrition of moderate degree 10/26/2021   Anemia of chronic kidney failure, stage 5 ([place]) 10/25/2021   Diabetes mellitus, type II ([place]) 10/25/2021   History of recent fall 10/25/2021   Hyperammonemia ([place]) 10/25/2021   Hepatic cirrhosis ([name]) 10/25/2021   Inadequate oral intake 10/25/2021   Norovirus 04/15/2021   Hypotension    Dehydration    Hypomagnesemia    Acute colitis 82/95/6213   Acute metabolic encephalopathy 08/65/7846   Acute gastroenteritis 10/11/2018   Intractable nausea and vomiting 10/10/2018   Sacral fracture ([place]) 96/29/5284   Complication of arteriovenous dialysis fistula 09/01/2017   ESRD on hemodialysis ([place]) 06/03/2017   Colitis 03/27/2017   Demand ischemia ([place]) 03/27/2017   Enteritis due to Clostridium difficile    Protein-calorie malnutrition, severe 02/18/2017   C. difficile  colitis 02/15/2017   UTI (urinary tract infection) 02/02/2017   E coli infection 02/02/2017   Lactic acidosis 02/02/2017   Diabetic neuropathy ([place]) 02/02/2017   Left-sided chest wall pain 02/02/2017   Fall 02/02/2017   Generalized weakness 02/02/2017   Pink eye, left 02/02/2017   Leukocytosis 02/02/2017   Anemia of chronic disease 02/02/2017   Nausea and vomiting 02/02/2017   Sepsis ([place]) 01/31/2017   Left foot pain 10/08/2016   Osteoarthritis of knee 10/08/2016   Hypertension 11/28/2015   Chronic gout due to renal impairment, right ankle and foot, without tophus (tophi) 07/31/2015   Bone lesion 06/05/2015   Primary osteoarthritis of both knees 06/05/2015   Pain in the chest 02/15/2015   Neck pain 02/15/2015   Diabetes type 2, uncontrolled 02/15/2015   Frequent headaches 02/15/2015   Type 2 diabetes mellitus with hyperglycemia ([place]) 02/15/2015   Hypertrophy of prostate with urinary obstruction and other lower urinary tract symptoms (LUTS) 09/07/2013   PCP:  [name], MD Pharmacy:   [place],  [place] [address] [address] Phone: [phone] Fax: [phone]     Social Determinants of Health (SDOH) Interventions    Readmission Risk Interventions Readmission Risk Prevention Plan 04/16/2021  Transportation Screening Complete  Home Care Screening Complete  Medication Review (RN CM) Complete  Some recent data might be hidden

## 2021-12-10 NOTE — Progress Notes (Signed)
[place] [facility] [place] NAME: Martin Gilbert    MR#:  [id number]  DATE OF BIRTH:  [birth year]  SUBJECTIVE:  Via interpreter Cough +, weakness No cp  REVIEW OF SYSTEMS:   Review of Systems  Constitutional:  Negative for chills, fever and weight loss.  HENT:  Negative for ear discharge, ear pain and nosebleeds.   Eyes:  Negative for blurred vision, pain and discharge.  Respiratory:  Positive for cough, sputum production and shortness of breath. Negative for wheezing and stridor.   Cardiovascular:  Negative for chest pain, palpitations, orthopnea and PND.  Gastrointestinal:  Negative for abdominal pain, diarrhea, nausea and vomiting.  Genitourinary:  Negative for frequency and urgency.  Musculoskeletal:  Negative for back pain and joint pain.  Neurological:  Positive for weakness. Negative for sensory change, speech change and focal weakness.  Psychiatric/Behavioral:  Negative for depression and hallucinations. The patient is not nervous/anxious.   Tolerating Diet: Tolerating PT:   DRUG ALLERGIES:  No Known Allergies  VITALS:  Blood pressure (!) 147/59, pulse (!) 59, temperature (!) 97.5 F (36.4 C), temperature source Oral, resp. rate 20, height 5\' 5"  (1.651 m), weight 90.7 kg, SpO2 97 %.  PHYSICAL EXAMINATION:   Physical Exam  GENERAL:  80 y.o.-year-old patient lying in the bed with no acute distress.  HEENT: Head atraumatic, normocephalic. Oropharynx and nasopharynx clear.  LUNGS: decreased breath sounds bilaterally, no wheezing,  few basilar rales, no rhonchi. No use of accessory muscles of respiration.  CARDIOVASCULAR: S1, S2 normal. No murmurs, rubs, or gallops.  ABDOMEN: Soft, nontender, nondistended. Bowel sounds present. No organomegaly or mass.  EXTREMITIES: No cyanosis, clubbing or edema b/l.    NEUROLOGIC: nonfocal PSYCHIATRIC:  patient is alert and awake  SKIN: No obvious rash, lesion, or ulcer.   LABORATORY  PANEL:  CBC Recent Labs  Lab 12/09/21 1833  WBC 6.1  HGB 8.6*  HCT 26.8*  PLT 212    Chemistries  Recent Labs  Lab 12/09/21 1507 12/09/21 1833  NA 138  --   K 4.6  --   CL 103  --   CO2 24  --   GLUCOSE 108*  --   BUN 61*  --   CREATININE 7.56* 7.82*  CALCIUM 8.6*  --   AST 34  --   ALT 22  --   ALKPHOS 276*  --   BILITOT 1.4*  --    Cardiac Enzymes No results for input(s): TROPONINI in the last 168 hours. RADIOLOGY:  DG Chest 1 View  Result Date: 12/09/2021 CLINICAL DATA:  Weakness EXAM: CHEST  1 VIEW COMPARISON:  July 18, 2020 FINDINGS: The cardiomediastinal silhouette is unchanged and enlarged in contour.Biapical pleuroparenchymal thickening, similar comparison to prior. No pleural effusion. No pneumothorax. Mild diffuse reticulation versus or prominence. Scattered platelike opacities at the LEFT lung base consistent with atelectasis/scar. Visualized abdomen is unremarkable. IMPRESSION: Constellation of findings are favored to reflect cardiomegaly with mild pulmonary edema and scattered atelectasis. Electronically Signed   By: Valentino Saxon M.D.   On: 12/09/2021 14:29   DG Knee 2 Views Left  Result Date: 12/09/2021 CLINICAL DATA:  fall with pain EXAM: LEFT KNEE - 1-2 VIEW COMPARISON:  May 27, 2015 FINDINGS: Osteopenia. No acute fracture or dislocation. Cyst mild degenerative changes of the medial and lateral compartments. 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 No unexpected radiopaque foreign body. Vascular calcifications. No significant joint  effusion. IMPRESSION: No acute fracture or dislocation. Electronically Signed   By: Valentino Saxon M.D.   On: 12/09/2021 14:27   DG Knee 2 Views Right  Result Date: 12/09/2021 CLINICAL DATA:  fall with pain EXAM: RIGHT KNEE - 1-2 VIEW COMPARISON:  None. FINDINGS: Osteopenia. No acute fracture or dislocation. Moderate to severe degenerative changes of the lateral  compartment with subcortical sclerosis and joint space narrowing. Enthesopathic changes of the patellar tendon. No area of erosion or osseous destruction. No unexpected radiopaque foreign body. Vascular calcifications. No significant joint effusion. IMPRESSION: No acute fracture or dislocation. Electronically Signed   By: Valentino Saxon M.D.   On: 12/09/2021 14:25   DG HIP UNILAT WITH PELVIS 2-3 VIEWS LEFT  Result Date: 12/09/2021 CLINICAL DATA:  fall with pain EXAM: DG HIP (WITH OR WITHOUT PELVIS) 2-3V LEFT COMPARISON:  June 15, 2021 FINDINGS: Osteopenia. No acute fracture or dislocation. Mild degenerative changes of the LEFT hip. Unchanged high density material at bilateral sacral iliac joints. Evaluation of the sacrum is limited due to overlapping bowel contents. No unexpected radiopaque foreign body. Pelvic phleboliths and vascular calcifications. IMPRESSION: No definitive acute fracture or dislocation although evaluation is limited by osteopenia. If persistent concern for nondisplaced hip or pelvic fracture, recommend dedicated pelvic MRI. Electronically Signed   By: Valentino Saxon M.D.   On: 12/09/2021 14:23   ASSESSMENT AND PLAN:   Martin Gilbert  is a 80 y.o. male with a known history of end-stage renal disease on hemodialysis, hypertension, diabetes type II not on any meds, hyperlipidemia comes emergency room with complaints of whole body hurts. Patient is a very poor historian. No family at bedside.   Acute pulmonary edema/fluid overload acute on chronic CHF diastolic end-stage renal disease on hemodialysis -- patient came in with increasing shortness of breath, generalized body ache, cough -- had dialysis 3  times this week--according to dter Angie -- BNP 1200, sats hundred percent on room air -- nephrology consultation with Dr. Juleen China to dialyzed tomorrow -- potassium stable -- echo ordered -- continue home meds which is beta-blockers and hydralazine --consider empiric  abxs if cough does not improve after HD tomorrow --12/26-- start po empiric doxycycline Pt to get HD today, some sob, weak Seen by PT--recommends HHPT   end-stage renal disease on hemodialysis -- nephrology consulted   generalized body aches, DJD, left foot pain -- patient's x-rays of the lower extremity are negative for any trauma/fracture -- he was evaluated by podiatry in November 2022 for left foot pain was found to have Achilles tendinitis-- took a course of prednisone -- physical therapy to see patient -- TOC for discharge planning to home with HH--TOC has informed dter angie   Hyperlipidemia -- continue statins   no family at bedside     Family Communication :dter angie on the phone 12/26 Consults :nephrology Code Status :FULL DVT prophylaxis :heparin Level of care: Telemetry Cardiac    Remains inpatient appropriate because: cough, weakness, fluid overload, needs HD today        TOTAL TIME TAKING CARE OF THIS PATIENT: 30 minutes.  >50% time spent on counselling and coordination of care  Note: This dictation was prepared with Dragon dictation along with smaller phrase technology. Any transcriptional errors that result from this process are unintentional.  Fritzi Mandes M.D    [facility]   CC: Primary care physician; [name], MD Patient ID: Martin Gilbert, male   DOB: 17-Feb-1941, 80 y.o.   MRN: [id number]

## 2021-12-10 NOTE — Evaluation (Signed)
Occupational Therapy Evaluation Patient Details Name: Martin Gilbert MRN: [id number] DOB: [birth year] Today's Date: 12/10/2021   History of Present Illness Martin Gilbert is a 32yoM comes to [facility] ED on 12/25 c persistent cough and generalized weakness. Pt has had severe recent falls and dificulty mobilizing. PMH: DM, HTN, HLD, ESRD on HD. No acute fractures seen on imaging studies.  Per prior admissions daughter mentioned pt is normally mod-I for household and short community ADL's with no AD and intermittent use of RW after dialysis due to weakness   Clinical Impression   Pt seen for OT evaluation this date in setting of acute hospitalization d/t falls and weakness. He presents today with decreased fxl activity tolerance and general weakness. He requires SUPV for sup to sit and MIN A to get back to bed end of session. He tolerates CTS x2 with hand held assist and MIN A; to perform LB ADLs and MOD A for actual LB ADLs including dressing and peri care. He is returned to bed end of session. He reports being MOD I for fxl mobility at baseline and reports he has some bathing help at baseline d/t visual deficits. Pt this date requiring increased ADL assist and therefore will likely require f/u HHOT as well as increased family support and below-listed equipment if he does not already possess.      Recommendations for follow up therapy are one component of a multi-disciplinary discharge planning process, led by the attending physician.  Recommendations may be updated based on patient status, additional functional criteria and insurance authorization.   Follow Up Recommendations  Home health OT    Assistance Recommended at Discharge Frequent or constant Supervision/Assistance  Functional Status Assessment  Patient has had a recent decline in their functional status and demonstrates the ability to make significant improvements in function in a reasonable and predictable amount of time.   Equipment Recommendations  BSC/3in1;Tub/shower seat    Recommendations for Other Services       Precautions / Restrictions Precautions Precautions: Fall Restrictions Weight Bearing Restrictions: No      Mobility Bed Mobility Overal bed mobility: Needs Assistance Bed Mobility: Supine to Sit;Sit to Supine;Rolling Rolling: Supervision   Supine to sit: Supervision;HOB elevated Sit to supine: Min assist   General bed mobility comments: increased time    Transfers Overall transfer level: Needs assistance Equipment used: 1 person hand held assist Transfers: Sit to/from Stand Sit to Stand: Min assist           General transfer comment: increased time, cues for safety, CTS x2 for LB ADLs      Balance Overall balance assessment: Needs assistance   Sitting balance-Leahy Scale: Good       Standing balance-Leahy Scale: Fair                             ADL either performed or assessed with clinical judgement   ADL                                         General ADL Comments: SETUP to MIN A for UB ADLs, MIN to MOD A for LB ADLs, MIN A for STS wtih arm in arm     Vision Patient Visual Report: No change from baseline       Perception     Praxis  Pertinent Vitals/Pain Pain Assessment: Faces Faces Pain Scale: Hurts little more Pain Location: legs Pain Descriptors / Indicators: Discomfort;Grimacing;Guarding Pain Intervention(s): Limited activity within patient's tolerance;Monitored during session;Repositioned     Hand Dominance Right   Extremity/Trunk Assessment Upper Extremity Assessment Upper Extremity Assessment: Generalized weakness   Lower Extremity Assessment Lower Extremity Assessment: Generalized weakness       Communication Communication Communication: HOH;Prefers language other than English [name] via [employer], ID # [phone])   Cognition Arousal/Alertness: Awake/alert Behavior During  Therapy: WFL for tasks assessed/performed Overall Cognitive Status: Difficult to assess                                 General Comments: used interpreter, difficult to tell sometimes, when he does not respond, if r/t [facility] or cognition and interpreter not sure either.     General Comments       Exercises Other Exercises Other Exercises: OT ed with pt re: role   Shoulder Instructions      Home Living Family/patient expects to be discharged to:: Private residence Living Arrangements: Spouse/significant other Available Help at Discharge: Family Type of Home: House Home Access: Level entry     Home Layout: One level     Bathroom Shower/Tub: Teacher, early years/pre: Standard Bathroom Accessibility: Yes   Home Equipment: Rollator (4 wheels);BSC/3in1;Shower seat;Wheelchair - manual;Cane - single point   Additional Comments: Interpreter, EMR used for PLOF      Prior Functioning/Environment Prior Level of Function : Needs assist       Physical Assist : Mobility (physical);ADLs (physical)     Mobility Comments: householdAMB, more difficulty with mobility on HD days ADLs Comments: Pt reports he needs assistance with bathing and dressing; reports that his wife is visually impaired.        OT Problem List: Decreased strength;Decreased activity tolerance      OT Treatment/Interventions: Self-care/ADL training;Therapeutic exercise;Therapeutic activities    OT Goals(Current goals can be found in the care plan section) Acute Rehab OT Goals Patient Stated Goal: to go home OT Goal Formulation: With patient Time For Goal Achievement: 12/24/21 Potential to Achieve Goals: Good ADL Goals Pt Will Perform Lower Body Dressing: with modified independence Pt Will Transfer to Toilet: with modified independence Pt Will Perform Toileting - Clothing Manipulation and hygiene: with modified independence  OT Frequency: Min 2X/week   Barriers to D/C:             Co-evaluation              AM-PAC OT "6 Clicks" Daily Activity     Outcome Measure Help from another person eating meals?: None Help from another person taking care of personal grooming?: None Help from another person toileting, which includes using toliet, bedpan, or urinal?: A Lot Help from another person bathing (including washing, rinsing, drying)?: A Lot Help from another person to put on and taking off regular upper body clothing?: None Help from another person to put on and taking off regular lower body clothing?: A Lot 6 Click Score: 18   End of Session Equipment Utilized During Treatment: Gait belt Nurse Communication: Mobility status  Activity Tolerance: Patient tolerated treatment well Patient left: in bed;with call bell/phone within reach;with nursing/sitter in room  OT Visit Diagnosis: Unsteadiness on feet (R26.81);Muscle weakness (generalized) (M62.81)                Time: 7654-6503 OT Time Calculation (  min): 22 min Charges:  OT General Charges $OT Visit: 1 Visit OT Evaluation $OT Eval Moderate Complexity: 1 Mod OT Treatments $Self Care/Home Management : 8-22 mins  Gerrianne Scale, MS, OTR/L ascom [phone] 12/10/21, 5:54 PM

## 2021-12-10 NOTE — Progress Notes (Signed)
[facility]  ROUNDING NOTE   Subjective:   [name] was admitted to [facility] on 12/09/2021 for Fluid overload [E87.70]  Last hemodialysis treatment was 12/23.   Patient complains of body pain. Patient is Martin Gilbert speaking. It is difficult to get a history even with virtual Spanish interpreter.   He states he is short of breath and needs dialysis today.   Objective:  Vital signs in last 24 hours:  Temp:  [98.9 F (37.2 C)] 98.9 F (37.2 C) (12/25 1309) Pulse Rate:  [58-83] 62 (12/26 0925) Resp:  [14-23] 21 (12/26 0925) BP: (110-165)/(40-64) 136/46 (12/26 0730) SpO2:  [93 %-99 %] 98 % (12/26 0925) Weight:  [90.7 kg] 90.7 kg (12/25 1317)  Weight change:  Filed Weights   12/09/21 1317  Weight: 90.7 kg    Intake/Output: No intake/output data recorded.   Intake/Output this shift:  No intake/output data recorded.  Physical Exam: General: NAD, laying in bed  Head: Normocephalic, atraumatic. Moist oral mucosal membranes  Eyes: Anicteric, PERRL  Neck: Supple, trachea midline  Lungs:  Bilateral crackles, room air  Heart: Regular rate and rhythm  Abdomen:  Soft, nontender,   Extremities:  + peripheral edema.  Neurologic: Nonfocal, moving all four extremities  Skin: No lesions  Access: Left AVF    Basic Metabolic Panel: Recent Labs  Lab 12/09/21 1507 12/09/21 1833  NA 138  --   K 4.6  --   CL 103  --   CO2 24  --   GLUCOSE 108*  --   BUN 61*  --   CREATININE 7.56* 7.82*  CALCIUM 8.6*  --     Liver Function Tests: Recent Labs  Lab 12/09/21 1507  AST 34  ALT 22  ALKPHOS 276*  BILITOT 1.4*  PROT 7.2  ALBUMIN 2.5*   No results for input(s): LIPASE, AMYLASE in the last 168 hours. Recent Labs  Lab 12/09/21 1707  AMMONIA 76*    CBC: Recent Labs  Lab 12/09/21 1507 12/09/21 1833  WBC 6.2 6.1  NEUTROABS 4.4  --   HGB 8.7* 8.6*  HCT 27.0* 26.8*  MCV 98.2 98.2  PLT 204 212    Cardiac Enzymes: No results for input(s):  CKTOTAL, CKMB, CKMBINDEX, TROPONINI in the last 168 hours.  BNP: Invalid input(s): POCBNP  CBG: No results for input(s): GLUCAP in the last 168 hours.  Microbiology: Results for orders placed or performed during the hospital encounter of 12/09/21  Resp Panel by RT-PCR (Flu A&B, Covid) Nasopharyngeal Swab     Status: None   Collection Time: 12/09/21  3:07 PM   Specimen: Nasopharyngeal Swab; Nasopharyngeal(NP) swabs in vial transport medium  Result Value Ref Range Status   SARS Coronavirus 2 by RT PCR NEGATIVE NEGATIVE Final    Comment: (NOTE) SARS-CoV-2 target nucleic acids are NOT DETECTED.  The SARS-CoV-2 RNA is generally detectable in upper respiratory specimens during the acute phase of infection. The lowest concentration of SARS-CoV-2 viral copies this assay can detect is 138 copies/mL. A negative result does not preclude SARS-Cov-2 infection and should not be used as the sole basis for treatment or other patient management decisions. A negative result may occur with  improper specimen collection/handling, submission of specimen other than nasopharyngeal swab, presence of viral mutation(s) within the areas targeted by this assay, and inadequate number of viral copies(<138 copies/mL). A negative result must be combined with clinical observations, patient history, and epidemiological information. The expected result is Negative.  Fact Sheet for Patients:  EntrepreneurPulse.com.au  Fact Sheet for Healthcare Providers:  IncredibleEmployment.be  This test is no t yet approved or cleared by the [place] FDA and  has been authorized for detection and/or diagnosis of SARS-CoV-2 by FDA under an Emergency Use Authorization (EUA). This EUA will remain  in effect (meaning this test can be used) for the duration of the COVID-19 declaration under Section 564(b)(1) of the Act, 21 U.S.C.section 360bbb-3(b)(1), unless the authorization is  terminated  or revoked sooner.       Influenza A by PCR NEGATIVE NEGATIVE Final   Influenza B by PCR NEGATIVE NEGATIVE Final    Comment: (NOTE) The Xpert Xpress SARS-CoV-2/FLU/RSV plus assay is intended as an aid in the diagnosis of influenza from Nasopharyngeal swab specimens and should not be used as a sole basis for treatment. Nasal washings and aspirates are unacceptable for Xpert Xpress SARS-CoV-2/FLU/RSV testing.  Fact Sheet for Patients: EntrepreneurPulse.com.au  Fact Sheet for Healthcare Providers: IncredibleEmployment.be  This test is not yet approved or cleared by the [place] FDA and has been authorized for detection and/or diagnosis of SARS-CoV-2 by FDA under an Emergency Use Authorization (EUA). This EUA will remain in effect (meaning this test can be used) for the duration of the COVID-19 declaration under Section 564(b)(1) of the Act, 21 U.S.C. section 360bbb-3(b)(1), unless the authorization is terminated or revoked.  Performed at [facility], [address] [place], [place] [address]     Coagulation Studies: No results for input(s): LABPROT, INR in the last 72 hours.  Urinalysis: No results for input(s): COLORURINE, LABSPEC, PHURINE, GLUCOSEU, HGBUR, BILIRUBINUR, KETONESUR, PROTEINUR, UROBILINOGEN, NITRITE, LEUKOCYTESUR in the last 72 hours.  Invalid input(s): APPERANCEUR    Imaging: DG Chest 1 View  Result Date: 12/09/2021 CLINICAL DATA:  Weakness EXAM: CHEST  1 VIEW COMPARISON:  July 18, 2020 FINDINGS: The cardiomediastinal silhouette is unchanged and enlarged in contour.Biapical pleuroparenchymal thickening, similar comparison to prior. No pleural effusion. No pneumothorax. Mild diffuse reticulation versus or prominence. Scattered platelike opacities at the LEFT lung base consistent with atelectasis/scar. Visualized abdomen is unremarkable. IMPRESSION: Constellation of findings are favored to  reflect cardiomegaly with mild pulmonary edema and scattered atelectasis. Electronically Signed   By: Valentino Saxon M.D.   On: 12/09/2021 14:29   DG Knee 2 Views Left  Result Date: 12/09/2021 CLINICAL DATA:  fall with pain EXAM: LEFT KNEE - 1-2 VIEW COMPARISON:  May 27, 2015 FINDINGS: Osteopenia. No acute fracture or dislocation. Cyst mild degenerative changes of the medial and lateral compartments. 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 No unexpected radiopaque foreign body. Vascular calcifications. No significant joint effusion. IMPRESSION: No acute fracture or dislocation. Electronically Signed   By: Valentino Saxon M.D.   On: 12/09/2021 14:27   DG Knee 2 Views Right  Result Date: 12/09/2021 CLINICAL DATA:  fall with pain EXAM: RIGHT KNEE - 1-2 VIEW COMPARISON:  None. FINDINGS: Osteopenia. No acute fracture or dislocation. Moderate to severe degenerative changes of the lateral compartment with subcortical sclerosis and joint space narrowing. Enthesopathic changes of the patellar tendon. No area of erosion or osseous destruction. No unexpected radiopaque foreign body. Vascular calcifications. No significant joint effusion. IMPRESSION: No acute fracture or dislocation. Electronically Signed   By: Valentino Saxon M.D.   On: 12/09/2021 14:25   DG HIP UNILAT WITH PELVIS 2-3 VIEWS LEFT  Result Date: 12/09/2021 CLINICAL DATA:  fall with pain EXAM: DG HIP (WITH OR WITHOUT PELVIS) 2-3V LEFT  COMPARISON:  June 15, 2021 FINDINGS: Osteopenia. No acute fracture or dislocation. Mild degenerative changes of the LEFT hip. Unchanged high density material at bilateral sacral iliac joints. Evaluation of the sacrum is limited due to overlapping bowel contents. No unexpected radiopaque foreign body. Pelvic phleboliths and vascular calcifications. IMPRESSION: No definitive acute fracture or dislocation although evaluation is limited by osteopenia. If  persistent concern for nondisplaced hip or pelvic fracture, recommend dedicated pelvic MRI. Electronically Signed   By: Valentino Saxon M.D.   On: 12/09/2021 14:23     Medications:     allopurinol  50 mg Oral Daily   atorvastatin  20 mg Oral Daily   cholecalciferol  1,000 Units Oral Daily   docusate sodium  100 mg Oral BID   ferrous sulfate  325 mg Oral BID WC   heparin  5,000 Units Subcutaneous Q8H   hydrALAZINE  50 mg Oral TID   metoprolol succinate  25 mg Oral Daily   pantoprazole  40 mg Oral Daily   tamsulosin  0.4 mg Oral QPC supper   acetaminophen **OR** acetaminophen, albuterol, guaiFENesin, ondansetron **OR** ondansetron (ZOFRAN) IV, polyethylene glycol  Assessment/ Plan:  [name] is a 80 y.o. Hispanic (Martin Gilbert speaking) male with end stage renal disease on hemodialysis, hypertension, BPH, diabetes mellitus type II, hyperlipidemia who presents to [facility] on 12/09/2021 for Fluid overload [E87.70]  [facility] Left AVF 73kg.   End stage renal disease: scheduled for dialysis today. Patient with fluid overload. UF goal of 3.5-4 liters. Evaluate daily for dialysis need.   Anemia of chronic kidney disease: hemoglobin 8.6 - EPO with HD treatment.   Hypertension: 141/47. Continue home regimen of hydralazine, metoprolol and tamsulosin.   Secondary Hyperparathyroidism: not currently on binders.    LOS: 1 Martin Gilbert 12/26/202210:45 AM

## 2021-12-10 NOTE — Evaluation (Signed)
Physical Therapy Evaluation Patient Details Name: Martin Gilbert MRN: [id number] DOB: 26-Feb-1941 Today's Date: 12/10/2021  History of Present Illness  Zanden Colver is a 38yoM comes [facility] ED on 12/25 c persistent cough and generalized weakness. Pt has had severe recent falls and dificulty mobilizing. PMH: DM, HTN, HLD, ESRD on HD. No acute fractures seen on imaging studies.  Per prior admissions daughter mentioned pt is normally mod-I for household and short community ADL's with no AD and intermittent use of RW after dialysis due to weakness  Clinical Impression  Pt admitted with above diagnosis. Pt currently with functional limitations due to the deficits listed below (see "PT Problem List"). Patient agreeable to PT evaluation. Pt confirms most home info and PLOF as seen in prior PT notes, prio admissions. Pt still having Left leg pain, asks for pain meds. Pt able to move to EOB, then stand from bed with hand held assist but needs no physical assist to move. Pt is limited in tolerance to standing <60sec. Pt reports feeling more weak with all mobility compared to baseline. With pt being on HD, a STR stay is not optimal, as he would not be able to partake in the increased frequency of rehab surfaces. Pt has adequate equipment at home to manage his mobility needs, but not clear if wife is able to provide all needed assistance. Patient's assessment this date reveals the patient requires an additional person present for safety and/or physical assistance to complete their typical ADL. At baseline, the patient is able to perform ADL with modified independence. Patient will benefit from skilled PT intervention to maximize independence and safety in mobility required for basic ADL performance at discharge.          Recommendations for follow up therapy are one component of a multi-disciplinary discharge planning process, led by the attending physician.  Recommendations may be updated based on  patient status, additional functional criteria and insurance authorization.  Follow Up Recommendations Home health PT (sounds like he needs more social support at home)    Assistance Recommended at Discharge Intermittent Supervision/Assistance  Functional Status Assessment Patient has had a recent decline in their functional status and demonstrates the ability to make significant improvements in function in a reasonable and predictable amount of time.  Equipment Recommendations  None recommended by PT    Recommendations for Other Services       Precautions / Restrictions Precautions Precautions: Fall Restrictions Weight Bearing Restrictions: No      Mobility  Bed Mobility Overal bed mobility: Needs Assistance Bed Mobility: Supine to Sit;Sit to Supine;Rolling Rolling: Supervision   Supine to sit: Supervision Sit to supine: Supervision   General bed mobility comments: needs some assist with scooting up in bed. generally weak, but able to move with moderate effort    Transfers Overall transfer level: Needs assistance Equipment used: 1 person hand held assist Transfers: Sit to/from Stand Sit to Stand: From elevated surface           General transfer comment: stands twice due to bed alarm error, able to remain standing x30 seconds without difficulty but grows more weak andasks to return to sitting.    Ambulation/Gait Ambulation/Gait assistance:  (No walker in room; unsfe to attempt due to limited pain issues.)                Stairs            Wheelchair Mobility    Modified Rankin (Stroke Patients Only)  Balance                                             Pertinent Vitals/Pain Pain Assessment:  (Left lateral upper and lower leg, does not give a number when asked, but able to bear weight)    Home Living Family/patient expects to be discharged to:: Private residence Living Arrangements: Spouse/significant other Available  Help at Discharge: Family Type of Home: House Home Access: Level entry       Home Layout: One level Home Equipment: Rollator (4 wheels);BSC/3in1;Shower seat;Wheelchair - manual;Cane - single point      Prior Function Prior Level of Function : Needs assist             Mobility Comments: householdAMB, more difficulty with mobility on HD days ADLs Comments: Pt reports he needs assistance with bathing and dressing; reports that his wife is visually impaired.     Hand Dominance   Dominant Hand: Right    Extremity/Trunk Assessment                Communication   Communication: HOH;Prefers language other than English  Cognition Arousal/Alertness: Awake/alert Behavior During Therapy: WFL for tasks assessed/performed Overall Cognitive Status: Within Functional Limits for tasks assessed                                          General Comments      Exercises     Assessment/Plan    PT Assessment Patient needs continued PT services  PT Problem List Decreased strength;Decreased activity tolerance;Decreased balance;Decreased mobility       PT Treatment Interventions DME instruction;Gait training;Stair training;Functional mobility training;Therapeutic activities;Therapeutic exercise;Patient/family education    PT Goals (Current goals can be found in the Care Plan section)  Acute Rehab PT Goals Patient Stated Goal: regain strength, have less pain PT Goal Formulation: With patient Time For Goal Achievement: 12/24/21 Potential to Achieve Goals: Fair    Frequency Min 2X/week   Barriers to discharge Inaccessible home environment      Co-evaluation               AM-PAC PT "6 Clicks" Mobility  Outcome Measure Help needed turning from your back to your side while in a flat bed without using bedrails?: None Help needed moving from lying on your back to sitting on the side of a flat bed without using bedrails?: None Help needed moving to and  from a bed to a chair (including a wheelchair)?: A Little Help needed standing up from a chair using your arms (e.g., wheelchair or bedside chair)?: A Little Help needed to walk in hospital room?: A Lot Help needed climbing 3-5 steps with a railing? : A Lot 6 Click Score: 18    End of Session   Activity Tolerance: Patient tolerated treatment well;Patient limited by fatigue;Patient limited by pain Patient left: in bed;with call bell/phone within reach Nurse Communication: Mobility status (pain meds; OT told about new diaper need) PT Visit Diagnosis: Difficulty in walking, not elsewhere classified (R26.2);Unsteadiness on feet (R26.81);Other abnormalities of gait and mobility (R26.89);Muscle weakness (generalized) (M62.81);History of falling (Z91.81)    Time: 7062-3762 PT Time Calculation (min) (ACUTE ONLY): 15 min   Charges:   PT Evaluation $PT Eval Low Complexity: 1 Low  9:33 AM, 12/10/21 Etta Grandchild, PT, DPT Physical Therapist - [facility]  [phone] (Helen)    Noami Bove C 12/10/2021, 9:29 AM

## 2021-12-10 NOTE — Progress Notes (Signed)
HD completed, tolerated well, no complications, UF net emoved 3L. Pt asymptomatic.

## 2021-12-11 DIAGNOSIS — I5033 Acute on chronic diastolic (congestive) heart failure: Secondary | ICD-10-CM

## 2021-12-11 DIAGNOSIS — I5032 Chronic diastolic (congestive) heart failure: Secondary | ICD-10-CM | POA: Insufficient documentation

## 2021-12-11 LAB — BLOOD GAS, VENOUS
Acid-base deficit: 0.9 mmol/L (ref 0.0–2.0)
Bicarbonate: 23.5 mmol/L (ref 20.0–28.0)
O2 Saturation: 92.6 %
Patient temperature: 37
pCO2, Ven: 37 mmHg — ABNORMAL LOW (ref 44.0–60.0)
pH, Ven: 7.41 (ref 7.250–7.430)
pO2, Ven: 65 mmHg — ABNORMAL HIGH (ref 32.0–45.0)

## 2021-12-11 LAB — HEPATITIS B SURFACE ANTIBODY, QUANTITATIVE: Hep B S AB Quant (Post): 4.3 m[IU]/mL — ABNORMAL LOW (ref >9.9)

## 2021-12-11 MED ORDER — DOXYCYCLINE HYCLATE 100 MG PO TABS: 100.0000 mg | ORAL_TABLET | Freq: Two times a day (BID) | ORAL | 0 refills | Status: AC

## 2021-12-11 MED ORDER — GUAIFENESIN 100 MG/5ML PO LIQD: 10.0000 mL | ORAL | 0 refills | Status: DC | PRN

## 2021-12-11 NOTE — Discharge Instructions (Signed)
Resume your HD as per your out pt schedule

## 2021-12-11 NOTE — Progress Notes (Signed)
Discharge instructions given to patient and family member at bedside via video interpreter. Verbalized understanding. No acute distress at this time. IV removed from patient. Patient wheeled out to private vehicle.

## 2021-12-11 NOTE — Discharge Summary (Signed)
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 male with a known history of end-stage renal disease on hemodialysis, hypertension, diabetes type II not on any meds, hyperlipidemia comes emergency room with complaints of whole body hurts. Patient is a very poor historian. No family at bedside.   Acute pulmonary edema/fluid overload acute on chronic CHF diastolic end-stage renal disease on hemodialysis -- patient came in with increasing shortness of breath, generalized body ache, cough -- had dialysis 3  times this week--according to dter Angie -- BNP 1200, sats hundred percent on room air -- nephrology consultation with Dr. Juleen China to dialyzed tomorrow -- potassium stable -- echo ordered -- continue home meds which is beta-blockers and hydralazine --consider empiric abxs if cough does not improve after HD tomorrow --12/26-- start po empiric doxycycline Pt to get HD today, some sob, weak. UF 3000 ml Seen by PT--recommends HHPT --12/27--overall doing ok. weak   end-stage renal disease on hemodialysis -- nephrology consulted   generalized body aches, DJD, left foot pain -- patient's x-rays of  the lower extremity are negative for any trauma/fracture -- he was evaluated by podiatry in November 2022 for left foot pain was found to have Achilles tendinitis-- took a course of prednisone -- physical therapy to see patient -- TOC for discharge planning to home with HH--TOC has informed dter angie   Hyperlipidemia -- continue statins   no family at bedside     Family Communication :dter angie on the phone 12/27 Consults :nephrology Code Status :FULL DVT prophylaxis :heparin Level of care: Telemetry Cardiac     Overall better    CONSULTS OBTAINED:  Treatment Team:  Lavonia Dana, MD  DRUG ALLERGIES:  No Known Allergies  DISCHARGE MEDICATIONS:   Allergies as of 12/11/2021   No Known Allergies      Medication List     STOP taking these medications    hydrALAZINE 50 MG tablet Commonly known as: APRESOLINE   multivitamin Tabs tablet   predniSONE 20 MG tablet Commonly known as: DELTASONE   sodium bicarbonate 650 MG tablet       TAKE these medications    acetaminophen 325 MG tablet Commonly known as: TYLENOL Take 2 tablets (650 mg total) by mouth every 6 (six) hours as needed for mild pain (or Fever >/= 101).   allopurinol 100 MG tablet Commonly known as: ZYLOPRIM Take 0.5 tablets (50 mg total) by mouth daily.   atorvastatin 20 MG tablet Commonly known as: LIPITOR Take 20 mg by mouth daily.   doxycycline 100 MG tablet Commonly known as: VIBRA-TABS Take 1 tablet (100 mg total) by mouth every 12 (twelve) hours for  4 days.   famotidine 20 MG tablet Commonly known as: PEPCID Take 1 tablet (20 mg total) by mouth daily.   ferrous sulfate 325 (65 FE) MG tablet Take 325 mg by mouth 2 (two) times daily with a meal.   guaiFENesin 100 MG/5ML liquid Commonly known as: ROBITUSSIN Take 10 mLs by mouth every 4 (four) hours as needed for cough or to loosen phlegm.   lactulose 10 GM/15ML solution Commonly known as: CHRONULAC Take 20 g (30 mL) 3-4  times a day for a goal of at least 2 bowel movements a day.   metoprolol succinate 25 MG 24 hr tablet Commonly known as: TOPROL-XL Take 25 mg by mouth daily.   ondansetron 4 MG disintegrating tablet Commonly known as: Zofran ODT Take 1 tablet (4 mg total) by mouth every 8 (eight) hours as needed for nausea or vomiting.   pantoprazole 40 MG tablet Commonly known as: PROTONIX Take 40 mg by mouth daily.   tamsulosin 0.4 MG Caps capsule Commonly known as: FLOMAX Take 1 capsule (0.4 mg total) by mouth daily after supper.   Vitamin D (Cholecalciferol) 25 MCG (1000 UT) Tabs Take 1 tablet by mouth daily.        If you experience worsening of your admission symptoms, develop shortness of breath, life threatening emergency, suicidal or homicidal thoughts you must seek medical attention immediately by calling 911 or calling your MD immediately  if symptoms less severe.  You Must read complete instructions/literature along with all the possible adverse reactions/side effects for all the Medicines you take and that have been prescribed to you. Take any new Medicines after you have completely understood and accept all the possible adverse reactions/side effects.   Please note  You were cared for by a hospitalist during your hospital stay. If you have any questions about your discharge medications or the care you received while you were in the hospital after you are discharged, you can call the unit and asked to speak with the hospitalist on call if the hospitalist that took care of you is not available. Once you are discharged, your primary care physician will handle any further medical issues. Please note that NO REFILLS for any discharge medications will be authorized once you are discharged, as it is imperative that you return to your primary care physician (or establish a relationship with a primary care physician if you do not have one) for your aftercare needs so that they can reassess your  need for medications and monitor your lab values. Today   SUBJECTIVE  Overall breathing better weak   VITAL SIGNS:  Blood pressure 101/63, pulse 64, temperature 98.4 F (36.9 C), resp. rate 18, height 5\' 5"  (1.651 m), weight 90.7 kg, SpO2 96 %.  I/O:   Intake/Output Summary (Last 24 hours) at 12/11/2021 0825 Last data filed at 12/10/2021 1814 Gross per 24 hour  Intake --  Output 3000 ml  Net -3000 ml    PHYSICAL EXAMINATION:  GENERAL:  80 y.o.-year-old patient lying in the bed with no acute distress. Chronically ill LUNGS: Normal breath sounds bilaterally, no wheezing, rales,rhonchi or crepitation. No use of accessory muscles of respiration.  CARDIOVASCULAR: S1, S2 normal. No murmurs, rubs, or gallops.  ABDOMEN: Soft, non-tender, non-distended. Bowel sounds present. No organomegaly or mass.  EXTREMITIES: No pedal edema, cyanosis, or clubbing.  NEUROLOGIC: non-focal PSYCHIATRIC:  patient is alert and awake SKIN: No obvious rash, lesion, or ulcer.   DATA REVIEW:   CBC  Recent Labs  Lab 12/10/21 1525  WBC 5.6  HGB 8.8*  HCT 27.5*  PLT 229    Chemistries  Recent Labs  Lab 12/09/21 1507 12/09/21 1833 12/10/21 1525  NA 138  --  137  K 4.6  --  4.6  CL 103  --  103  CO2 24  --  23  GLUCOSE 108*  --  95  BUN 61*  --  62*  CREATININE 7.56*   < > 7.64*  CALCIUM 8.6*  --  8.4*  AST 34  --   --   ALT 22  --   --   ALKPHOS 276*  --   --   BILITOT 1.4*  --   --    < > = values in this interval not displayed.    Microbiology Results   Recent Results (from the past 240 hour(s))  Resp Panel by RT-PCR (Flu A&B, Covid) Nasopharyngeal Swab     Status: None   Collection Time: 12/09/21  3:07 PM   Specimen: Nasopharyngeal Swab; Nasopharyngeal(NP) swabs in vial transport medium  Result Value Ref Range Status   SARS Coronavirus 2 by RT PCR NEGATIVE NEGATIVE Final    Comment: (NOTE) SARS-CoV-2 target nucleic acids are NOT DETECTED.  The SARS-CoV-2 RNA is generally  detectable in upper respiratory specimens during the acute phase of infection. The lowest concentration of SARS-CoV-2 viral copies this assay can detect is 138 copies/mL. A negative result does not preclude SARS-Cov-2 infection and should not be used as the sole basis for treatment or other patient management decisions. A negative result may occur with  improper specimen collection/handling, submission of specimen other than nasopharyngeal swab, presence of viral mutation(s) within the areas targeted by this assay, and inadequate number of viral copies(<138 copies/mL). A negative result must be combined with clinical observations, patient history, and epidemiological information. The expected result is Negative.  Fact Sheet for Patients:  EntrepreneurPulse.com.au  Fact Sheet for Healthcare Providers:  IncredibleEmployment.be  This test is no t yet approved or cleared by the [place] FDA and  has been authorized for detection and/or diagnosis of SARS-CoV-2 by FDA under an Emergency Use Authorization (EUA). This EUA will remain  in effect (meaning this test can be used) for the duration of the COVID-19 declaration under Section 564(b)(1) of the Act, 21 U.S.C.section 360bbb-3(b)(1), unless the authorization is terminated  or revoked sooner.       Influenza A by PCR NEGATIVE NEGATIVE Final   Influenza B by PCR NEGATIVE NEGATIVE Final    Comment: (NOTE) The Xpert Xpress SARS-CoV-2/FLU/RSV plus assay is intended as an aid in the diagnosis of influenza from Nasopharyngeal swab specimens and should not be used as a sole basis for treatment. Nasal washings and aspirates are unacceptable for Xpert Xpress SARS-CoV-2/FLU/RSV testing.  Fact Sheet for Patients: EntrepreneurPulse.com.au  Fact Sheet for Healthcare Providers: IncredibleEmployment.be  This test is not yet approved or cleared by the [place] FDA  and has been authorized for detection and/or diagnosis of SARS-CoV-2 by FDA under an Emergency Use Authorization (EUA). This EUA will remain in effect (meaning this test can be used) for the duration of the COVID-19 declaration under Section 564(b)(1) of the Act, 21 U.S.C. section 360bbb-3(b)(1), unless the authorization is terminated or revoked.  Performed at [facility], [address] [place], [place] [address]     RADIOLOGY:  DG Chest 1 View  Result Date: 12/09/2021 CLINICAL DATA:  Weakness EXAM: CHEST  1 VIEW COMPARISON:  July 18, 2020 FINDINGS: The cardiomediastinal silhouette is unchanged and enlarged in contour.Biapical pleuroparenchymal thickening, similar comparison to prior. No pleural effusion. No pneumothorax. Mild diffuse reticulation versus or prominence. Scattered platelike opacities at the LEFT lung base consistent with atelectasis/scar. Visualized abdomen is unremarkable. IMPRESSION: Constellation of findings are favored to reflect cardiomegaly with mild pulmonary edema and scattered atelectasis. Electronically Signed   By: Valentino Saxon M.D.   On: 12/09/2021 14:29   DG Knee 2 Views Left  Result Date: 12/09/2021 CLINICAL DATA:  fall with pain EXAM: LEFT KNEE - 1-2 VIEW COMPARISON:  May 27, 2015 FINDINGS: Osteopenia. No acute fracture or dislocation. Cyst mild degenerative changes of the medial and lateral compartments. 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 No unexpected radiopaque foreign body. Vascular calcifications. No significant joint effusion. IMPRESSION: No acute fracture or dislocation. Electronically Signed   By: Valentino Saxon M.D.   On: 12/09/2021 14:27   DG Knee 2 Views Right  Result Date: 12/09/2021 CLINICAL DATA:  fall with pain EXAM: RIGHT KNEE - 1-2 VIEW COMPARISON:  None. FINDINGS: Osteopenia. No acute fracture or dislocation. Moderate to severe degenerative changes of the  lateral compartment with subcortical sclerosis and joint space narrowing. Enthesopathic changes of the patellar tendon. No area of erosion or osseous destruction. No unexpected radiopaque foreign body. Vascular calcifications. No significant joint effusion. IMPRESSION: No acute fracture or dislocation. Electronically Signed   By: Valentino Saxon M.D.   On: 12/09/2021 14:25   DG HIP UNILAT WITH PELVIS 2-3 VIEWS LEFT  Result Date: 12/09/2021 CLINICAL DATA:  fall with pain EXAM: DG HIP (WITH OR WITHOUT PELVIS) 2-3V LEFT COMPARISON:  June 15, 2021 FINDINGS: Osteopenia. No acute fracture or dislocation. Mild degenerative changes of the LEFT hip. Unchanged high density material at bilateral sacral iliac joints. Evaluation of the sacrum is limited due to overlapping bowel contents. No unexpected radiopaque foreign body. Pelvic phleboliths and vascular calcifications. IMPRESSION: No definitive acute fracture or dislocation although evaluation is limited by osteopenia. If persistent concern for nondisplaced hip or pelvic fracture, recommend dedicated pelvic MRI. Electronically Signed   By: Valentino Saxon M.D.   On: 12/09/2021 14:23     CODE STATUS:     Code Status Orders  (From admission, onward)           Start     Ordered   12/09/21 1810  Full code  Continuous        12/09/21 1809           Code Status History     Date Active Date Inactive Code Status Order ID Comments User Context   10/25/2021 2332 10/29/2021 1827 Full Code [id number]  Athena Masse, MD ED   04/14/2021 0743 04/17/2021 0126 Full Code [id number]  Lang Snow, NP ED   07/18/2020 1526 07/21/2020 2308 Full Code [id number]  Sharen Hones, MD ED   10/11/2018 0017 10/14/2018 1612 Full Code [id number]  Amelia Jo, MD Inpatient   09/29/2018 2025 10/01/2018 1414 Full Code [id number]  Vaughan Basta, MD Inpatient   11/25/2017 1939 11/28/2017 2014 Full Code [id number]  Dustin Flock, MD ED   09/05/2017 1508  09/05/2017 1955 Full Code [id number]  Schnier, Dolores Lory, MD Inpatient   08/02/2017 1834 08/06/2017 1959 Full Code [id number]  Loletha Grayer, MD ED   03/27/2017 0542 04/04/2017 2141 Full Code [id number]  Saundra Shelling, MD Inpatient   02/15/2017 0911 02/19/2017 1804 Full Code [id number]  Saundra Shelling, MD Inpatient  01/31/2017 0236 02/02/2017 2022 Full Code [id number]  Saundra Shelling, MD ED        TOTAL TIME TAKING CARE OF THIS PATIENT: 35 minutes.    Fritzi Mandes M.D  [facility]    CC: Primary care physician; [name], MD

## 2021-12-11 NOTE — Progress Notes (Signed)
Patient pushed call bell and motioning to sit on side of bed. Patient assisted to side of bed. Video interpreter called. Patient states via interpreter that he was tired of laying down and wanted to sit up. Instructed patient to push button when he wanted to lay back down so he could be supervised. Patient verbalized via interpreter that he would push the red button when he was ready to lay down. Also discussed via interpreter meds being administered and gave opportunity to ask questions.

## 2021-12-11 NOTE — Progress Notes (Signed)
PT Cancellation Note  Patient Details Name: Martin Gilbert MRN: [id number] DOB: 31-Jul-1941   Cancelled Treatment:    Reason Eval/Treat Not Completed: Other (comment).  Chart reviewed pt and attempted to see.  Dr. Posey Pronto outside room and after discussion, agreed upon not seeing pt as he is being d/c later this evening.   Gwenlyn Saran, PT, DPT 12/11/21, 12:18 PM

## 2022-01-18 ENCOUNTER — Other Ambulatory Visit: Payer: Self-pay | Admitting: Primary Care

## 2022-01-18 DIAGNOSIS — R059 Cough, unspecified: Secondary | ICD-10-CM

## 2022-01-21 ENCOUNTER — Emergency Department: Payer: Medicare Other

## 2022-01-21 ENCOUNTER — Ambulatory Visit: Payer: Medicare Other | Admitting: Podiatry

## 2022-01-21 ENCOUNTER — Other Ambulatory Visit: Payer: Self-pay

## 2022-01-21 ENCOUNTER — Inpatient Hospital Stay
Admission: EM | Admit: 2022-01-21 | Discharge: 2022-02-06 | DRG: 871 | Disposition: A | Discharge status: Hospice | Payer: Medicare Other | Attending: Internal Medicine | Admitting: Internal Medicine

## 2022-01-21 ENCOUNTER — Inpatient Hospital Stay: Payer: Medicare Other | Admitting: Radiology

## 2022-01-21 ENCOUNTER — Encounter: Payer: Self-pay | Admitting: Emergency Medicine

## 2022-01-21 ENCOUNTER — Inpatient Hospital Stay: Payer: Medicare Other

## 2022-01-21 DIAGNOSIS — R197 Diarrhea, unspecified: Secondary | ICD-10-CM | POA: Diagnosis not present

## 2022-01-21 DIAGNOSIS — J918 Pleural effusion in other conditions classified elsewhere: Secondary | ICD-10-CM | POA: Diagnosis present

## 2022-01-21 DIAGNOSIS — Z66 Do not resuscitate: Secondary | ICD-10-CM | POA: Diagnosis present

## 2022-01-21 DIAGNOSIS — D631 Anemia in chronic kidney disease: Secondary | ICD-10-CM | POA: Diagnosis present

## 2022-01-21 DIAGNOSIS — I5032 Chronic diastolic (congestive) heart failure: Secondary | ICD-10-CM | POA: Diagnosis present

## 2022-01-21 DIAGNOSIS — G9341 Metabolic encephalopathy: Secondary | ICD-10-CM | POA: Diagnosis present

## 2022-01-21 DIAGNOSIS — D638 Anemia in other chronic diseases classified elsewhere: Secondary | ICD-10-CM | POA: Diagnosis present

## 2022-01-21 DIAGNOSIS — J90 Pleural effusion, not elsewhere classified: Secondary | ICD-10-CM | POA: Diagnosis not present

## 2022-01-21 DIAGNOSIS — R339 Retention of urine, unspecified: Secondary | ICD-10-CM

## 2022-01-21 DIAGNOSIS — J9601 Acute respiratory failure with hypoxia: Secondary | ICD-10-CM | POA: Diagnosis present

## 2022-01-21 DIAGNOSIS — J942 Hemothorax: Secondary | ICD-10-CM | POA: Diagnosis not present

## 2022-01-21 DIAGNOSIS — R079 Chest pain, unspecified: Secondary | ICD-10-CM

## 2022-01-21 DIAGNOSIS — Z515 Encounter for palliative care: Secondary | ICD-10-CM | POA: Diagnosis not present

## 2022-01-21 DIAGNOSIS — N3001 Acute cystitis with hematuria: Secondary | ICD-10-CM | POA: Diagnosis present

## 2022-01-21 DIAGNOSIS — L89312 Pressure ulcer of right buttock, stage 2: Secondary | ICD-10-CM | POA: Diagnosis present

## 2022-01-21 DIAGNOSIS — M109 Gout, unspecified: Secondary | ICD-10-CM | POA: Diagnosis present

## 2022-01-21 DIAGNOSIS — E440 Moderate protein-calorie malnutrition: Secondary | ICD-10-CM | POA: Diagnosis present

## 2022-01-21 DIAGNOSIS — H9190 Unspecified hearing loss, unspecified ear: Secondary | ICD-10-CM | POA: Diagnosis present

## 2022-01-21 DIAGNOSIS — N3000 Acute cystitis without hematuria: Secondary | ICD-10-CM

## 2022-01-21 DIAGNOSIS — I132 Hypertensive heart and chronic kidney disease with heart failure and with stage 5 chronic kidney disease, or end stage renal disease: Secondary | ICD-10-CM | POA: Diagnosis present

## 2022-01-21 DIAGNOSIS — Z8249 Family history of ischemic heart disease and other diseases of the circulatory system: Secondary | ICD-10-CM

## 2022-01-21 DIAGNOSIS — S271XXA Traumatic hemothorax, initial encounter: Secondary | ICD-10-CM | POA: Diagnosis present

## 2022-01-21 DIAGNOSIS — E8809 Other disorders of plasma-protein metabolism, not elsewhere classified: Secondary | ICD-10-CM | POA: Diagnosis present

## 2022-01-21 DIAGNOSIS — E119 Type 2 diabetes mellitus without complications: Secondary | ICD-10-CM

## 2022-01-21 DIAGNOSIS — R5381 Other malaise: Secondary | ICD-10-CM | POA: Diagnosis present

## 2022-01-21 DIAGNOSIS — N2581 Secondary hyperparathyroidism of renal origin: Secondary | ICD-10-CM | POA: Diagnosis present

## 2022-01-21 DIAGNOSIS — R0600 Dyspnea, unspecified: Secondary | ICD-10-CM

## 2022-01-21 DIAGNOSIS — S2241XA Multiple fractures of ribs, right side, initial encounter for closed fracture: Secondary | ICD-10-CM | POA: Diagnosis present

## 2022-01-21 DIAGNOSIS — A498 Other bacterial infections of unspecified site: Secondary | ICD-10-CM | POA: Diagnosis not present

## 2022-01-21 DIAGNOSIS — R9389 Abnormal findings on diagnostic imaging of other specified body structures: Secondary | ICD-10-CM | POA: Diagnosis not present

## 2022-01-21 DIAGNOSIS — K219 Gastro-esophageal reflux disease without esophagitis: Secondary | ICD-10-CM | POA: Diagnosis present

## 2022-01-21 DIAGNOSIS — N401 Enlarged prostate with lower urinary tract symptoms: Secondary | ICD-10-CM | POA: Diagnosis present

## 2022-01-21 DIAGNOSIS — A419 Sepsis, unspecified organism: Secondary | ICD-10-CM | POA: Diagnosis present

## 2022-01-21 DIAGNOSIS — N186 End stage renal disease: Secondary | ICD-10-CM | POA: Diagnosis present

## 2022-01-21 DIAGNOSIS — N410 Acute prostatitis: Secondary | ICD-10-CM | POA: Diagnosis present

## 2022-01-21 DIAGNOSIS — R3912 Poor urinary stream: Secondary | ICD-10-CM | POA: Diagnosis not present

## 2022-01-21 DIAGNOSIS — M549 Dorsalgia, unspecified: Secondary | ICD-10-CM

## 2022-01-21 DIAGNOSIS — B9620 Unspecified Escherichia coli [E. coli] as the cause of diseases classified elsewhere: Secondary | ICD-10-CM | POA: Diagnosis present

## 2022-01-21 DIAGNOSIS — R778 Other specified abnormalities of plasma proteins: Secondary | ICD-10-CM | POA: Diagnosis not present

## 2022-01-21 DIAGNOSIS — I5031 Acute diastolic (congestive) heart failure: Secondary | ICD-10-CM | POA: Diagnosis not present

## 2022-01-21 DIAGNOSIS — J9811 Atelectasis: Secondary | ICD-10-CM | POA: Diagnosis present

## 2022-01-21 DIAGNOSIS — E871 Hypo-osmolality and hyponatremia: Secondary | ICD-10-CM | POA: Diagnosis present

## 2022-01-21 DIAGNOSIS — Z79899 Other long term (current) drug therapy: Secondary | ICD-10-CM

## 2022-01-21 DIAGNOSIS — N390 Urinary tract infection, site not specified: Secondary | ICD-10-CM | POA: Diagnosis present

## 2022-01-21 DIAGNOSIS — R051 Acute cough: Secondary | ICD-10-CM

## 2022-01-21 DIAGNOSIS — Z20822 Contact with and (suspected) exposure to covid-19: Secondary | ICD-10-CM | POA: Diagnosis present

## 2022-01-21 DIAGNOSIS — E1122 Type 2 diabetes mellitus with diabetic chronic kidney disease: Secondary | ICD-10-CM | POA: Diagnosis present

## 2022-01-21 DIAGNOSIS — Z992 Dependence on renal dialysis: Secondary | ICD-10-CM

## 2022-01-21 DIAGNOSIS — Z1612 Extended spectrum beta lactamase (ESBL) resistance: Secondary | ICD-10-CM | POA: Diagnosis present

## 2022-01-21 DIAGNOSIS — D62 Acute posthemorrhagic anemia: Secondary | ICD-10-CM | POA: Diagnosis present

## 2022-01-21 DIAGNOSIS — K7460 Unspecified cirrhosis of liver: Secondary | ICD-10-CM | POA: Diagnosis present

## 2022-01-21 DIAGNOSIS — R102 Pelvic and perineal pain: Secondary | ICD-10-CM | POA: Diagnosis not present

## 2022-01-21 DIAGNOSIS — N412 Abscess of prostate: Secondary | ICD-10-CM | POA: Diagnosis present

## 2022-01-21 DIAGNOSIS — R338 Other retention of urine: Secondary | ICD-10-CM | POA: Diagnosis present

## 2022-01-21 DIAGNOSIS — I10 Essential (primary) hypertension: Secondary | ICD-10-CM

## 2022-01-21 DIAGNOSIS — I959 Hypotension, unspecified: Secondary | ICD-10-CM | POA: Diagnosis not present

## 2022-01-21 DIAGNOSIS — E785 Hyperlipidemia, unspecified: Secondary | ICD-10-CM | POA: Diagnosis present

## 2022-01-21 DIAGNOSIS — R531 Weakness: Secondary | ICD-10-CM

## 2022-01-21 DIAGNOSIS — R4182 Altered mental status, unspecified: Secondary | ICD-10-CM | POA: Diagnosis not present

## 2022-01-21 DIAGNOSIS — E876 Hypokalemia: Secondary | ICD-10-CM | POA: Diagnosis not present

## 2022-01-21 DIAGNOSIS — Z452 Encounter for adjustment and management of vascular access device: Secondary | ICD-10-CM

## 2022-01-21 DIAGNOSIS — Z7189 Other specified counseling: Secondary | ICD-10-CM | POA: Diagnosis not present

## 2022-01-21 DIAGNOSIS — Z6828 Body mass index (BMI) 28.0-28.9, adult: Secondary | ICD-10-CM

## 2022-01-21 HISTORY — PX: IR THORACENTESIS ASP PLEURAL SPACE W/IMG GUIDE: IMG5380

## 2022-01-21 LAB — CBC WITH DIFFERENTIAL/PLATELET
Abs Immature Granulocytes: 0.06 K/uL (ref 0.00–0.07)
Basophils Absolute: 0 K/uL (ref 0.0–0.1)
Basophils Relative: 0 %
Eosinophils Absolute: 0.1 K/uL (ref 0.0–0.5)
Eosinophils Relative: 1 %
HCT: 23.8 % — ABNORMAL LOW (ref 39.0–52.0)
Hemoglobin: 7.7 g/dL — ABNORMAL LOW (ref 13.0–17.0)
Immature Granulocytes: 1 %
Lymphocytes Relative: 7 %
Lymphs Abs: 0.9 K/uL (ref 0.7–4.0)
MCH: 31.7 pg (ref 26.0–34.0)
MCHC: 32.4 g/dL (ref 30.0–36.0)
MCV: 97.9 fL (ref 80.0–100.0)
Monocytes Absolute: 0.9 K/uL (ref 0.1–1.0)
Monocytes Relative: 7 %
Neutro Abs: 10.2 K/uL — ABNORMAL HIGH (ref 1.7–7.7)
Neutrophils Relative %: 84 %
Platelets: 226 K/uL (ref 150–400)
RBC: 2.43 MIL/uL — ABNORMAL LOW (ref 4.22–5.81)
RDW: 17.6 % — ABNORMAL HIGH (ref 11.5–15.5)
WBC: 12.1 K/uL — ABNORMAL HIGH (ref 4.0–10.5)
nRBC: 0 % (ref 0.0–0.2)

## 2022-01-21 LAB — COMPREHENSIVE METABOLIC PANEL WITH GFR
ALT: 20 U/L (ref 0–44)
AST: 52 U/L — ABNORMAL HIGH (ref 15–41)
Albumin: 1.9 g/dL — ABNORMAL LOW (ref 3.5–5.0)
Alkaline Phosphatase: 443 U/L — ABNORMAL HIGH (ref 38–126)
Anion gap: 10 (ref 5–15)
BUN: 69 mg/dL — ABNORMAL HIGH (ref 8–23)
CO2: 24 mmol/L (ref 22–32)
Calcium: 7.9 mg/dL — ABNORMAL LOW (ref 8.9–10.3)
Chloride: 96 mmol/L — ABNORMAL LOW (ref 98–111)
Creatinine, Ser: 6.52 mg/dL — ABNORMAL HIGH (ref 0.61–1.24)
GFR, Estimated: 8 mL/min — ABNORMAL LOW
Glucose, Bld: 121 mg/dL — ABNORMAL HIGH (ref 70–99)
Potassium: 3.9 mmol/L (ref 3.5–5.1)
Sodium: 130 mmol/L — ABNORMAL LOW (ref 135–145)
Total Bilirubin: 1.6 mg/dL — ABNORMAL HIGH (ref 0.3–1.2)
Total Protein: 6.3 g/dL — ABNORMAL LOW (ref 6.5–8.1)

## 2022-01-21 LAB — URINALYSIS, COMPLETE (UACMP) WITH MICROSCOPIC
Glucose, UA: NEGATIVE mg/dL
Ketones, ur: NEGATIVE mg/dL
Nitrite: POSITIVE — AB
Protein, ur: >300 mg/dL — AB
Specific Gravity, Urine: 1.02 (ref 1.005–1.030)
Squamous Epithelial / HPF: NONE SEEN (ref 0–5)
WBC, UA: >50 WBC/hpf — ABNORMAL HIGH (ref 0–5)
pH: 7 (ref 5.0–8.0)

## 2022-01-21 LAB — PROTIME-INR
INR: 1.2 (ref 0.8–1.2)
Prothrombin Time: 15 seconds (ref 11.4–15.2)

## 2022-01-21 LAB — BODY FLUID CELL COUNT WITH DIFFERENTIAL
Eos, Fluid: 0 %
Lymphs, Fluid: 5 %
Monocyte-Macrophage-Serous Fluid: 1 %
Neutrophil Count, Fluid: 94 %
Total Nucleated Cell Count, Fluid: 63 cu mm

## 2022-01-21 LAB — GLUCOSE, CAPILLARY
Glucose-Capillary: 129 mg/dL — ABNORMAL HIGH (ref 70–99)
Glucose-Capillary: 141 mg/dL — ABNORMAL HIGH (ref 70–99)

## 2022-01-21 LAB — APTT: aPTT: 49 seconds — ABNORMAL HIGH (ref 24–36)

## 2022-01-21 LAB — ALBUMIN, PLEURAL OR PERITONEAL FLUID: Albumin, Fluid: <1.5 g/dL

## 2022-01-21 LAB — LIPASE, BLOOD: Lipase: 24 U/L (ref 11–51)

## 2022-01-21 LAB — BRAIN NATRIURETIC PEPTIDE: B Natriuretic Peptide: 488.3 pg/mL — ABNORMAL HIGH (ref 0.0–100.0)

## 2022-01-21 LAB — LACTIC ACID, PLASMA: Lactic Acid, Venous: 1 mmol/L (ref 0.5–1.9)

## 2022-01-21 LAB — TROPONIN I (HIGH SENSITIVITY)
Troponin I (High Sensitivity): 55 ng/L — ABNORMAL HIGH
Troponin I (High Sensitivity): 52 ng/L — ABNORMAL HIGH

## 2022-01-21 MED ORDER — NOREPINEPHRINE 4 MG/250ML-% IV SOLN
0.0000 ug/min | INTRAVENOUS | Status: DC
  Filled 2022-01-21: qty 250

## 2022-01-21 MED ORDER — CHLORHEXIDINE GLUCONATE CLOTH 2 % EX PADS
6.0000 | MEDICATED_PAD | Freq: Every day | CUTANEOUS | Status: DC
  Administered 2022-01-22 – 2022-02-05 (×15): 6 via TOPICAL
  Filled 2022-01-21: qty 6

## 2022-01-21 MED ORDER — SODIUM CHLORIDE 0.9 % IV SOLN: 250.0000 mL | INTRAVENOUS | Status: DC | PRN

## 2022-01-21 MED ORDER — ACETAMINOPHEN 325 MG PO TABS
650.0000 mg | ORAL_TABLET | Freq: Four times a day (QID) | ORAL | Status: DC | PRN
  Administered 2022-01-21 – 2022-01-26 (×2): 650 mg via ORAL
  Filled 2022-01-21 (×3): qty 2

## 2022-01-21 MED ORDER — SODIUM CHLORIDE 0.9% FLUSH
3.0000 mL | Freq: Two times a day (BID) | INTRAVENOUS | Status: DC
  Administered 2022-01-21 – 2022-02-06 (×29): 3 mL via INTRAVENOUS

## 2022-01-21 MED ORDER — FERROUS SULFATE 325 (65 FE) MG PO TABS
325.0000 mg | ORAL_TABLET | Freq: Two times a day (BID) | ORAL | Status: DC
  Administered 2022-01-21 – 2022-02-02 (×24): 325 mg via ORAL
  Filled 2022-01-21 (×26): qty 1

## 2022-01-21 MED ORDER — VANCOMYCIN HCL IN DEXTROSE 1-5 GM/200ML-% IV SOLN
1000.0000 mg | INTRAVENOUS | Status: DC
  Administered 2022-01-22 – 2022-01-24 (×2): 1000 mg via INTRAVENOUS
  Filled 2022-01-21 (×2): qty 200

## 2022-01-21 MED ORDER — ONDANSETRON HCL 4 MG PO TABS: 4.0000 mg | ORAL_TABLET | Freq: Four times a day (QID) | ORAL | Status: DC | PRN

## 2022-01-21 MED ORDER — ATORVASTATIN CALCIUM 20 MG PO TABS
20.0000 mg | ORAL_TABLET | Freq: Every day | ORAL | Status: DC
  Administered 2022-01-21 – 2022-02-02 (×13): 20 mg via ORAL
  Filled 2022-01-21 (×14): qty 1

## 2022-01-21 MED ORDER — ALLOPURINOL 100 MG PO TABS
50.0000 mg | ORAL_TABLET | Freq: Every day | ORAL | Status: DC
  Administered 2022-01-21 – 2022-02-02 (×13): 50 mg via ORAL
  Filled 2022-01-21 (×2): qty 1
  Filled 2022-01-21: qty 0.5
  Filled 2022-01-21 (×6): qty 1
  Filled 2022-01-21: qty 0.5
  Filled 2022-01-21 (×4): qty 1
  Filled 2022-01-21: qty 0.5

## 2022-01-21 MED ORDER — LIDOCAINE HCL URETHRAL/MUCOSAL 2 % EX GEL
1.0000 | Freq: Once | CUTANEOUS | Status: AC
Start: 2022-01-21 — End: 2022-01-21
  Administered 2022-01-21: 1 via URETHRAL
  Filled 2022-01-21: qty 10

## 2022-01-21 MED ORDER — VANCOMYCIN HCL IN DEXTROSE 1-5 GM/200ML-% IV SOLN
1000.0000 mg | Freq: Once | INTRAVENOUS | Status: AC
  Administered 2022-01-21: 1000 mg via INTRAVENOUS
  Filled 2022-01-21: qty 200

## 2022-01-21 MED ORDER — ONDANSETRON HCL 4 MG/2ML IJ SOLN: 4.0000 mg | Freq: Four times a day (QID) | INTRAMUSCULAR | Status: DC | PRN

## 2022-01-21 MED ORDER — SODIUM CHLORIDE 0.9% FLUSH
3.0000 mL | INTRAVENOUS | Status: DC | PRN
  Administered 2022-02-02: 3 mL via INTRAVENOUS

## 2022-01-21 MED ORDER — FENTANYL CITRATE PF 50 MCG/ML IJ SOSY
50.0000 ug | PREFILLED_SYRINGE | Freq: Once | INTRAMUSCULAR | Status: AC
  Administered 2022-01-21: 50 ug via INTRAVENOUS
  Filled 2022-01-21: qty 1

## 2022-01-21 MED ORDER — LACTATED RINGERS IV BOLUS
250.0000 mL | Freq: Once | INTRAVENOUS | Status: AC
  Administered 2022-01-21: 250 mL via INTRAVENOUS

## 2022-01-21 MED ORDER — FAMOTIDINE 20 MG PO TABS
20.0000 mg | ORAL_TABLET | Freq: Every day | ORAL | Status: DC
  Administered 2022-01-21 – 2022-02-02 (×13): 20 mg via ORAL
  Filled 2022-01-21 (×14): qty 1

## 2022-01-21 MED ORDER — VITAMIN D3 25 MCG (1000 UNIT) PO TABS
1000.0000 [IU] | ORAL_TABLET | Freq: Every day | ORAL | Status: DC
  Administered 2022-01-21 – 2022-02-02 (×13): 1000 [IU] via ORAL
  Filled 2022-01-21 (×29): qty 1

## 2022-01-21 MED ORDER — SODIUM CHLORIDE 0.9 % IV SOLN
2.0000 g | Freq: Once | INTRAVENOUS | Status: AC
  Administered 2022-01-21: 2 g via INTRAVENOUS
  Filled 2022-01-21: qty 2

## 2022-01-21 MED ORDER — GUAIFENESIN 100 MG/5ML PO LIQD
10.0000 mL | ORAL | Status: DC | PRN
  Administered 2022-01-23 – 2022-02-02 (×3): 10 mL via ORAL
  Filled 2022-01-21 (×4): qty 10

## 2022-01-21 MED ORDER — PIPERACILLIN-TAZOBACTAM IN DEX 2-0.25 GM/50ML IV SOLN
2.2500 g | Freq: Three times a day (TID) | INTRAVENOUS | Status: DC
  Administered 2022-01-21 – 2022-01-25 (×11): 2.25 g via INTRAVENOUS
  Filled 2022-01-21 (×18): qty 50

## 2022-01-21 MED ORDER — TAMSULOSIN HCL 0.4 MG PO CAPS
0.4000 mg | ORAL_CAPSULE | Freq: Every day | ORAL | Status: DC
  Administered 2022-01-21 – 2022-02-02 (×12): 0.4 mg via ORAL
  Filled 2022-01-21 (×13): qty 1

## 2022-01-21 NOTE — ED Notes (Signed)
Pt transported to CT ?

## 2022-01-21 NOTE — ED Provider Notes (Signed)
[facility] Provider Note    Event Date/Time   First MD Initiated Contact with Patient 01/21/22 [phone]     (approximate)   History   Abdominal Pain   HPI  Martin Gilbert is a 81 y.o. male with a past medical history of HTN, iron deficiency anemia and ESRD currently on HD Tuesday Thursday Saturday without any recently missed sessions who presents for evaluation of some suprapubic discomfort and decreased urine output.  It seems patient has not had any urine output in about 3 weeks.  This is about how long his lower abdomen has been hurting as well.  He has not had any other abdominal pain, back pain, vomiting diarrhea or constipation.  He has some chronic cough treated with albuterol inhaler for possible asthma although he states he is never formally diagnosed with this.  No new shortness of breath or new chest pain only states he has had several months of chest discomfort.  No fevers or any other clear associated sick symptoms.     Physical Exam  Triage Vital Signs: ED Triage Vitals  Enc Vitals Group     BP 01/21/22 0939 (!) 111/40     Pulse Rate 01/21/22 0939 71     Resp 01/21/22 0939 16     Temp 01/21/22 0939 98.3 F (36.8 C)     Temp Source 01/21/22 0939 Oral     SpO2 01/21/22 0939 93 %     Weight 01/21/22 0938 200 lb (90.7 kg)     Height 01/21/22 0938 5' 5" (1.651 m)     Head Circumference --      Peak Flow --      Pain Score --      Pain Loc --      Pain Edu? --      Excl. in [place]? --     Most recent vital signs: Vitals:   01/21/22 1430 01/21/22 1500  BP: (!) 120/44 (!) 122/52  Pulse: 64 64  Resp:    Temp:    SpO2: 97% 94%    General: Awake, no distress.  CV:  Good peripheral perfusion.  Resp:  Normal effort.  Clear bilaterally. Abd:  No distention.  Some mild tenderness in the suprapubic region. Other:  Bilateral pitting edema.  Patient also has phimosis with a little bit of purulent discharge at the tip although I am able to  retract this to visualize the glans.  Testicles are unremarkable bilaterally.  Right remainder penile shaft is unremarkable   ED Results / Procedures / Treatments  Labs (all labs ordered are listed, but only abnormal results are displayed) Labs Reviewed  CBC WITH DIFFERENTIAL/PLATELET - Abnormal; Notable for the following components:      Result Value   WBC 12.1 (*)    RBC 2.43 (*)    Hemoglobin 7.7 (*)    HCT 23.8 (*)    RDW 17.6 (*)    Neutro Abs 10.2 (*)    All other components within normal limits  COMPREHENSIVE METABOLIC PANEL - Abnormal; Notable for the following components:   Sodium 130 (*)    Chloride 96 (*)    Glucose, Bld 121 (*)    BUN 69 (*)    Creatinine, Ser 6.52 (*)    Calcium 7.9 (*)    Total Protein 6.3 (*)    Albumin 1.9 (*)    AST 52 (*)    Alkaline Phosphatase 443 (*)    Total Bilirubin 1.6 (*)  GFR, Estimated 8 (*)    All other components within normal limits  URINALYSIS, COMPLETE (UACMP) WITH MICROSCOPIC - Abnormal; Notable for the following components:   Color, Urine BROWN (*)    APPearance TURBID (*)    Hgb urine dipstick MODERATE (*)    Bilirubin Urine SMALL (*)    Protein, ur >300 (*)    Nitrite POSITIVE (*)    Leukocytes,Ua LARGE (*)    WBC, UA >50 (*)    Bacteria, UA MANY (*)    All other components within normal limits  BRAIN NATRIURETIC PEPTIDE - Abnormal; Notable for the following components:   B Natriuretic Peptide 488.3 (*)    All other components within normal limits  APTT - Abnormal; Notable for the following components:   aPTT 49 (*)    All other components within normal limits  TROPONIN I (HIGH SENSITIVITY) - Abnormal; Notable for the following components:   Troponin I (High Sensitivity) 55 (*)    All other components within normal limits  TROPONIN I (HIGH SENSITIVITY) - Abnormal; Notable for the following components:   Troponin I (High Sensitivity) 52 (*)    All other components within normal limits  CULTURE, BLOOD (ROUTINE  X 2)  CULTURE, BLOOD (ROUTINE X 2)  URINE CULTURE  LIPASE, BLOOD  LACTIC ACID, PLASMA  PROTIME-INR  PSA     EKG   EKG is remarkable for differential rhythm with a rate of 68, right bundle branch block and left anterior fascicle block without other clear evidence of acute ischemia other than some nonspecific changes in V2 and V3.  RADIOLOGY  Chest x-ray ordered and interpreted by myself shows right-sided pleural effusion and some atelectasis without evidence of pneumothorax or left-sided effusion.  There is mild cardiomegaly.  Also reviewed radiology interpretation and agree the findings have some peribronchial cuffing and aortic atherosclerosis.  CT abdomen pelvis ordered and reviewed by myself shows no evidence of kidney stone, perinephric stranding, diverticulitis or other clear abdominal process.  There is some heterogenous enhancement of the prostate.  I also reviewed radiology interpretation and agree with their findings of concern for possible prostatitis versus malignancy and they also make note of right-sided pleural effusion that appears loculated.    PROCEDURES:  Critical Care performed: Yes, see critical care procedure note(s)  .Critical Care Performed by: Lucrezia Starch, MD Authorized by: Lucrezia Starch, MD   Critical care provider statement:    Critical care time (minutes):  30   Critical care was necessary to treat or prevent imminent or life-threatening deterioration of the following conditions:  Sepsis   Critical care was time spent personally by me on the following activities:  Development of treatment plan with patient or surrogate, discussions with consultants, evaluation of patient's response to treatment, examination of patient, ordering and review of laboratory studies, ordering and review of radiographic studies, ordering and performing treatments and interventions, pulse oximetry, re-evaluation of patient's condition and review of old  charts    MEDICATIONS ORDERED IN ED: Medications  vancomycin (VANCOCIN) IVPB 1000 mg/200 mL premix (1,000 mg Intravenous New Bag/Given 01/21/22 1430)    Followed by  vancomycin (VANCOCIN) IVPB 1000 mg/200 mL premix (has no administration in time range)  norepinephrine (LEVOPHED) 31m in 2549m(0.016 mg/mL) premix infusion (0 mcg/min Intravenous Hold 01/21/22 1430)  acetaminophen (TYLENOL) tablet 650 mg (has no administration in time range)  allopurinol (ZYLOPRIM) tablet 50 mg (has no administration in time range)  atorvastatin (LIPITOR) tablet 20 mg (has no administration  in time range)  famotidine (PEPCID) tablet 20 mg (has no administration in time range)  tamsulosin (FLOMAX) capsule 0.4 mg (has no administration in time range)  ferrous sulfate tablet 325 mg (has no administration in time range)  Vitamin D (Cholecalciferol) TABS 1 tablet (has no administration in time range)  guaiFENesin (ROBITUSSIN) 100 MG/5ML liquid 10 mL (has no administration in time range)  sodium chloride flush (NS) 0.9 % injection 3 mL (has no administration in time range)  sodium chloride flush (NS) 0.9 % injection 3 mL (has no administration in time range)  0.9 %  sodium chloride infusion (has no administration in time range)  ondansetron (ZOFRAN) tablet 4 mg (has no administration in time range)    Or  ondansetron (ZOFRAN) injection 4 mg (has no administration in time range)  Chlorhexidine Gluconate Cloth 2 % PADS 6 each (has no administration in time range)  lidocaine (XYLOCAINE) 2 % jelly 1 application (1 application Urethral Given 01/21/22 1309)  lactated ringers bolus 250 mL (0 mLs Intravenous Stopped 01/21/22 1431)  fentaNYL (SUBLIMAZE) injection 50 mcg (50 mcg Intravenous Given 01/21/22 1310)  ceFEPIme (MAXIPIME) 2 g in sodium chloride 0.9 % 100 mL IVPB (0 g Intravenous Stopped 01/21/22 1431)  lactated ringers bolus 250 mL (0 mLs Intravenous Stopped 01/21/22 1518)     IMPRESSION / MDM / ASSESSMENT AND PLAN / ED  COURSE  I reviewed the triage vital signs and the nursing notes.                              Differential diagnosis includes, but is not limited to cystitis, urinary retention, balanitis and phimosis causing obstruction possible worsening kidney function and metabolic derangements.  Given patient's chronic cough and right-sided chest discomfort obtain a chest x-ray to assess for possible pneumonia or symptomatic effusion.  ECG with nonspecific findings and while initial troponin is elevated 55 could represent some troponinemia in the setting of CKD versus some demand ischemia in the setting of acute infectious process.  Lower suspicion for an occlusion MI and we will plan to trend this and hold off on anticoagulation pending repeat.  Chest x-ray ordered and interpreted by myself shows right-sided pleural effusion and some atelectasis without evidence of pneumothorax or left-sided effusion.  There is mild cardiomegaly.  Also reviewed radiology interpretation and agree the findings have some peribronchial cuffing and aortic atherosclerosis.  CT abdomen pelvis ordered and reviewed by myself shows no evidence of kidney stone, perinephric stranding, diverticulitis or other clear abdominal process.  There is some heterogenous enhancement of the prostate.  I also reviewed radiology interpretation and agree with their findings of concern for possible prostatitis versus malignancy and they also make note of right-sided pleural effusion that appears loculated.  BNP slightly elevated but down from obstructive month ago.  Today it is 488.  CBC shows WBC count of 12.1 and hemoglobin of 7.7.  Hemoglobin 1 month ago was 8.8.  Platelets are normal today.  CMP is remarkable for sodium of 130, BUN and creatinine consistent history of ESRD and elevated alk phos at 443.  Lipase not consistent with acute pancreatitis.  INR is unremarkable.  Foley catheter glucose replaced after concern for retention with bladder scan  showing 200 cc of urine.  UA returned findings consistent with cystitis with positive nitrites large leukocyte esterase and greater than 50 WBCs as well as many bacteria.  Given concern for loculated pleural effusion I reached  out to CT surgery at [facility] and consulted to see if patient needed to be transferred for surgical intervention or could be managed here.  Per staff communicated with CT surgery patient can be managed here with IR drain of right-sided chest drain.  Given concern for possible prostatitis versus malignancy I reached out to Dr. [name] with urology.  I discussed patient.  He will evaluate patient.  Given patient had multiple blood and low blood pressures with a systolic recorded at 5.36 and leukocytosis with 2 sources of possible infection I am concerned about sepsis.  Patient given small aliquots of fluid given concern for borderline volume overload started on broad-spectrum antibiotics after blood and urine cultures were obtained.  I discussed patient with admitting provider and he will be admitted to medicine service for further evaluation and management.        FINAL CLINICAL IMPRESSION(S) / ED DIAGNOSES   Final diagnoses:  Pleural effusion  Acute cystitis with hematuria  Urinary retention  Chest pain, unspecified type  Acute cough  Sepsis, due to unspecified organism, unspecified whether acute organ dysfunction present [facility])     Rx / DC Orders   ED Discharge Orders     None        Note:  This document was prepared using Dragon voice recognition software and may include unintentional dictation errors.   Lucrezia Starch, MD 01/21/22 1600

## 2022-01-21 NOTE — Procedures (Signed)
PROCEDURE SUMMARY:  Pre-procedure US shows highly loculated pleural effusion - the largest pocket was selected for thoracentesis.  Successful image-guided right thoracentesis. Yielded 10 milliliters of serosanguineous fluid - no further fluid was able to be removed despite multiple catheter manipulations. Appears to be consistent with organized hemothorax 2/2 history of rib fractures.  Patient tolerated procedure well. EBL <  1 mL No immediate complications.  Specimen was sent for labs. Post procedure CXR shows no pneumothorax.  Please see imaging section of Epic for full dictation.  Joaquim Nam PA-C 01/21/2022 3:44 PM

## 2022-01-21 NOTE — Progress Notes (Signed)
[facility]  ROUNDING NOTE   Subjective:   [name] was admitted to [facility] on 01/21/2022 for UTI (urinary tract infection) [N39.0]  Last hemodialysis treatment was Saturday, February 4. Left at 72.5kg - below his dry weight.   Patient states he has been having abdominal pain for over one week.   History is difficult to get even with [place] Interpreter.    Objective:  Vital signs in last 24 hours:  Temp:  [98.3 F (36.8 C)] 98.3 F (36.8 C) (02/06 0939) Pulse Rate:  [63-73] 64 (02/06 1500) Resp:  [16-21] 18 (02/06 1350) BP: (111-122)/(40-52) 122/52 (02/06 1500) SpO2:  [93 %-99 %] 94 % (02/06 1500) Weight:  [90.7 kg] 90.7 kg (02/06 0938)  Weight change:  Filed Weights   01/21/22 0938  Weight: 90.7 kg    Intake/Output: No intake/output data recorded.   Intake/Output this shift:  No intake/output data recorded.  Physical Exam: General: NAD, laying on stretcher  Head: Normocephalic, atraumatic. Moist oral mucosal membranes  Eyes: Anicteric, PERRL  Neck: Supple, trachea midline  Lungs:  Clear to auscultation  Heart: Regular rate and rhythm  Abdomen:  Suprapubic tenderness  Extremities:  No peripheral edema.  Neurologic: Nonfocal, moving all four extremities  Skin: No lesions  Access: Left AVF    Basic Metabolic Panel: Recent Labs  Lab 01/21/22 1025  NA 130*  K 3.9  CL 96*  CO2 24  GLUCOSE 121*  BUN 69*  CREATININE 6.52*  CALCIUM 7.9*    Liver Function Tests: Recent Labs  Lab 01/21/22 1025  AST 52*  ALT 20  ALKPHOS 443*  BILITOT 1.6*  PROT 6.3*  ALBUMIN 1.9*   Recent Labs  Lab 01/21/22 1025  LIPASE 24   No results for input(s): AMMONIA in the last 168 hours.  CBC: Recent Labs  Lab 01/21/22 1025  WBC 12.1*  NEUTROABS 10.2*  HGB 7.7*  HCT 23.8*  MCV 97.9  PLT 226    Cardiac Enzymes: No results for input(s): CKTOTAL, CKMB, CKMBINDEX, TROPONINI in the last 168 hours.  BNP: Invalid input(s):  POCBNP  CBG: No results for input(s): GLUCAP in the last 168 hours.  Microbiology: Results for orders placed or performed during the hospital encounter of 12/09/21  Resp Panel by RT-PCR (Flu A&B, Covid) Nasopharyngeal Swab     Status: None   Collection Time: 12/09/21  3:07 PM   Specimen: Nasopharyngeal Swab; Nasopharyngeal(NP) swabs in vial transport medium  Result Value Ref Range Status   SARS Coronavirus 2 by RT PCR NEGATIVE NEGATIVE Final    Comment: (NOTE) SARS-CoV-2 target nucleic acids are NOT DETECTED.  The SARS-CoV-2 RNA is generally detectable in upper respiratory specimens during the acute phase of infection. The lowest concentration of SARS-CoV-2 viral copies this assay can detect is 138 copies/mL. A negative result does not preclude SARS-Cov-2 infection and should not be used as the sole basis for treatment or other patient management decisions. A negative result may occur with  improper specimen collection/handling, submission of specimen other than nasopharyngeal swab, presence of viral mutation(s) within the areas targeted by this assay, and inadequate number of viral copies(<138 copies/mL). A negative result must be combined with clinical observations, patient history, and epidemiological information. The expected result is Negative.  Fact Sheet for Patients:  EntrepreneurPulse.com.au  Fact Sheet for Healthcare Providers:  IncredibleEmployment.be  This test is no t yet approved or cleared by the [place] FDA and  has been authorized for detection and/or diagnosis of SARS-CoV-2  by FDA under an Emergency Use Authorization (EUA). This EUA will remain  in effect (meaning this test can be used) for the duration of the COVID-19 declaration under Section 564(b)(1) of the Act, 21 U.S.C.section 360bbb-3(b)(1), unless the authorization is terminated  or revoked sooner.       Influenza A by PCR NEGATIVE NEGATIVE Final    Influenza B by PCR NEGATIVE NEGATIVE Final    Comment: (NOTE) The Xpert Xpress SARS-CoV-2/FLU/RSV plus assay is intended as an aid in the diagnosis of influenza from Nasopharyngeal swab specimens and should not be used as a sole basis for treatment. Nasal washings and aspirates are unacceptable for Xpert Xpress SARS-CoV-2/FLU/RSV testing.  Fact Sheet for Patients: EntrepreneurPulse.com.au  Fact Sheet for Healthcare Providers: IncredibleEmployment.be  This test is not yet approved or cleared by the [place] FDA and has been authorized for detection and/or diagnosis of SARS-CoV-2 by FDA under an Emergency Use Authorization (EUA). This EUA will remain in effect (meaning this test can be used) for the duration of the COVID-19 declaration under Section 564(b)(1) of the Act, 21 U.S.C. section 360bbb-3(b)(1), unless the authorization is terminated or revoked.  Performed at [facility], [address] [place], [place] [address]     Coagulation Studies: Recent Labs    01/21/22 1312  LABPROT 15.0  INR 1.2    Urinalysis: Recent Labs    01/21/22 1025  COLORURINE BROWN*  LABSPEC 1.020  PHURINE 7.0  GLUCOSEU NEGATIVE  HGBUR MODERATE*  BILIRUBINUR SMALL*  KETONESUR NEGATIVE  PROTEINUR >300*  NITRITE POSITIVE*  LEUKOCYTESUR LARGE*      Imaging: CT ABDOMEN PELVIS WO CONTRAST  Result Date: 01/21/2022 CLINICAL DATA:  Left lower quadrant abdominal pain with difficulty urinating for the past 3 weeks. Diarrhea. History of end-stage renal disease on hemodialysis. EXAM: CT ABDOMEN AND PELVIS WITHOUT CONTRAST TECHNIQUE: Multidetector CT imaging of the abdomen and pelvis was performed following the standard protocol without IV contrast. RADIATION DOSE REDUCTION: This exam was performed according to the departmental dose-optimization program which includes automated exposure control, adjustment of the mA and/or kV according to patient  size and/or use of iterative reconstruction technique. COMPARISON:  Abdominopelvic CT 06/15/2021. Chest radiographs earlier today. FINDINGS: Lower chest: As seen on earlier radiographs, there is new moderate-sized laterally loculated right pleural effusion. There are some air bubbles posteriorly in the right pleural space (image 16/2). There is compressive atelectasis or consolidation of the right lower lobe. Streaky left lower lobe pulmonary opacities are similar to the previous study. No evidence of pneumothorax. Atherosclerosis of the aorta and coronary arteries noted. Hepatobiliary: Nodular contours of the liver again noted suspicious for early cirrhosis. No focal lesion identified without contrast. No evidence of gallstones, gallbladder wall thickening or biliary dilatation. Pancreas: Unremarkable. No pancreatic ductal dilatation or surrounding inflammatory changes. Spleen: Normal in size without focal abnormality. Adrenals/Urinary Tract: Both adrenal glands appear normal. Bilateral renal cortical thinning with nonspecific perinephric soft tissue stranding again noted bilaterally. Small low-density renal lesions bilaterally are stable, likely cysts. No evidence of urinary tract calculus or hydronephrosis. The bladder appears grossly unremarkable. Stomach/Bowel: No enteric contrast administered. The stomach is poorly distended with possible generalized wall thickening, similar to the previous study. No evidence of bowel wall thickening, distention or surrounding inflammation. Vascular/Lymphatic: There are several mildly enlarged lymph nodes in the pelvis, including right iliac nodes measuring 8 mm on image 75/2 and 12 mm on image 81/2. There is a 10 mm left external iliac node on image 80/2. Aortic  and branch vessel atherosclerosis without acute vascular findings on noncontrast imaging. Reproductive: The prostate gland is enlarged. There are multiple new areas of ill-defined low-density within the gland,  measuring up to 3.1 x 2.6 cm on image 92/2. No definite surrounding inflammatory changes to suggest that this is inflammatory. Mild perirectal soft tissue stranding is similar to the previous study. Other: Mild soft tissue stranding throughout the mesenteric fat without significant ascites. No free air. Musculoskeletal: No acute or significant osseous findings. Previous bilateral iliac bone osteoplasty. IMPRESSION: 1. New ill-defined low density throughout the prostate gland which could reflect prostate cancer or prostatitis/abscess formation. Correlate with serum PSA levels. Recommend urology consultation. 2. Several mildly enlarged pelvic lymph nodes suspicious for metastatic disease. 3. No evidence of hydronephrosis or focal bladder abnormality. Stable chronic renal cortical thinning consistent with chronic renal failure. 4. As seen on earlier chest radiographs, laterally loculated right pleural effusion with air in the pleural space which may indicate empyema. Correlate clinically. Compressive right lower lobe atelectasis or consolidation. 5.  Aortic Atherosclerosis (ICD10-I70.0). Electronically Signed   By: Richardean Sale M.D.   On: 01/21/2022 11:59   DG Chest 2 View  Result Date: 01/21/2022 CLINICAL DATA:  81 year old male with history of chest pain and cough. EXAM: CHEST - 2 VIEW COMPARISON:  Chest x-ray 12/09/2021. FINDINGS: Diffuse interstitial prominence and widespread peribronchial cuffing again noted. New moderate to large partially loculated right pleural effusion with increasing atelectasis and/or consolidation throughout the right mid to lower lung. Probable areas of subsegmental atelectasis at the left lung base. No definite left pleural effusion. No pneumothorax. No evidence of pulmonary edema. Heart size is mildly enlarged. Upper mediastinal contours are within normal limits. IMPRESSION: 1. New moderate to large right pleural effusion which is likely partially loculated with extensive  atelectasis and/or consolidation in the right mid to lower lung. 2. Probable subsegmental atelectasis in the left lung base. 3. Diffuse interstitial prominence and peribronchial cuffing, concerning for an acute bronchitis. 4. Mild cardiomegaly. 5. Aortic atherosclerosis. Electronically Signed   By: Vinnie Langton M.D.   On: 01/21/2022 10:48     Medications:    lactated ringers     norepinephrine (LEVOPHED) Adult infusion Stopped (01/21/22 1430)   vancomycin 1,000 mg (01/21/22 1430)   Followed by   vancomycin        Assessment/ Plan:  [name] is a 81 y.o. Hispanic male who speaks [place] and limited [place] and [place].  Patient with end stage renal disease on hemodialysis, hypertension, BPH, diabetes mellitus type II, hyperlipidemia who presents to [facility] on 01/21/2022 for UTI (urinary tract infection) [N39.0]   [facility] TTS [facility] Left AVF 73kg.    End stage renal disease: Dialysis for tomorrow. Continue TTS schedule.    Anemia of chronic kidney disease: hemoglobin 7.7 - EPO with HD treatment.    Hypertension: 122/52. Home regimen includes hydralazine, metoprolol and tamsulosin.   Secondary Hyperparathyroidism: not currently on binders.    LOS: 0 Jeremias Broyhill 2/6/20233:06 PM

## 2022-01-21 NOTE — ED Notes (Signed)
RN given verbal ok to place coude catheter

## 2022-01-21 NOTE — Consult Note (Signed)
Pulmonary Medicine          Date: 01/21/2022,   MRN# [id number] Martin Gilbert July 26, 1941     AdmissionWeight: 90.7 kg                 CurrentWeight: 90.7 kg      CHIEF COMPLAINT:   Right moderate pleural effusion.    HISTORY OF PRESENT ILLNESS   This is an 81 yr old spanish speaking gentleman who came in with prior hx of abdominal pain, uti, moderate size right pleural effusion, end stage renal dz, on hemodialysis.  Asked to see ? Empyema.   Mild cough, no wheezing, hemoptysis.   PAST MEDICAL HISTORY   Past Medical History:  Diagnosis Date   Anemia    Arthritis    Back pain    BPH (benign prostatic hyperplasia)    CKD (chronic kidney disease), stage IV ([name])    Clostridium difficile colitis    Diabetes mellitus without complication ([name])    GERD (gastroesophageal reflux disease)    HTN (hypertension)    Hyperlipidemia      SURGICAL HISTORY   Past Surgical History:  Procedure Laterality Date   A/V FISTULAGRAM Left 04/20/2018   Procedure: A/V FISTULAGRAM;  Surgeon: Algernon Huxley, MD;  Location: [place] [facility];  Service: Cardiovascular;  Laterality: Left;   A/V FISTULAGRAM Left 11/25/2018   Procedure: A/V FISTULAGRAM;  Surgeon: Algernon Huxley, MD;  Location: [place] [facility];  Service: Cardiovascular;  Laterality: Left;   A/V FISTULAGRAM Left 01/11/2019   Procedure: A/V FISTULAGRAM;  Surgeon: Algernon Huxley, MD;  Location: [facility];  Service: Cardiovascular;  Laterality: Left;   AV FISTULA PLACEMENT Left 06/13/2017   Procedure: ARTERIOVENOUS (AV) FISTULA CREATION ( RADIAL CEPHALIC );  Surgeon: Katha Cabal, MD;  Location: [facility];  Service: Vascular;  Laterality: Left;   CATARACT EXTRACTION     one eye not sure which   DIALYSIS/PERMA CATHETER INSERTION N/A 04/03/2017   Procedure: Dialysis/Perma Catheter Insertion;  Surgeon: Algernon Huxley, MD;  Location: [place] [facility];  Service: Cardiovascular;  Laterality: N/A;    DIALYSIS/PERMA CATHETER INSERTION N/A 08/06/2017   Procedure: DIALYSIS/PERMA CATHETER INSERTION;  Surgeon: Algernon Huxley, MD;  Location: [place] [facility];  Service: Cardiovascular;  Laterality: N/A;   DIALYSIS/PERMA CATHETER REMOVAL N/A 08/04/2017   Procedure: DIALYSIS/PERMA CATHETER REMOVAL;  Surgeon: Algernon Huxley, MD;  Location: [place] [facility];  Service: Cardiovascular;  Laterality: N/A;   DIALYSIS/PERMA CATHETER REMOVAL N/A 12/31/2017   Procedure: DIALYSIS/PERMA CATHETER REMOVAL;  Surgeon: Algernon Huxley, MD;  Location: [place] [facility];  Service: Cardiovascular;  Laterality: N/A;   hernia repair     SACROPLASTY N/A 09/30/2018   Procedure: SACROPLASTY;  Surgeon: Hessie Knows, MD;  Location: [facility];  Service: Orthopedics;  Laterality: N/A;   UPPER EXTREMITY ANGIOGRAPHY Left 09/05/2017   Procedure: Upper Extremity Angiography;  Surgeon: Katha Cabal, MD;  Location: [place] [facility];  Service: Cardiovascular;  Laterality: Left;     FAMILY HISTORY   Family History  Problem Relation Age of Onset   Hypertension Mother    Diabetes Neg Hx      SOCIAL HISTORY   Social History   Tobacco Use   Smoking status: Never   Smokeless tobacco: Never  Vaping Use   Vaping Use: Never used  Substance Use Topics   Alcohol use: No    Comment: Drank in the past, but  has stopped for several years.    Drug use: No     MEDICATIONS    Home Medication:  Current Outpatient Rx   Order #: [id number] Class: No Print   Order #: [id number] Class: No Print   Order #: [id number] Class: Historical Med   Order #: [id number] Class: Print   Order #: [id number] Class: Historical Med   Order #: [id number] Class: Normal   Order #: [id number] Class: Print   Order #: [id number] Class: Historical Med   Order #: [id number] Class: Normal   Order #: [id number] Class: Historical Med   Order #: [id number] Class: Normal   Order #: [id number] Class: Historical Med    Current Medication:  Current  Facility-Administered Medications:    0.9 %  sodium chloride infusion, 250 mL, Intravenous, PRN, Agbata, Tochukwu, MD   acetaminophen (TYLENOL) tablet 650 mg, 650 mg, Oral, Q6H PRN, Agbata, Tochukwu, MD   allopurinol (ZYLOPRIM) tablet 50 mg, 50 mg, Oral, Daily, Agbata, Tochukwu, MD   atorvastatin (LIPITOR) tablet 20 mg, 20 mg, Oral, Daily, Agbata, Tochukwu, MD   [START ON 01/22/2022] Chlorhexidine Gluconate Cloth 2 % PADS 6 each, 6 each, Topical, Q0600, Breeze, Shantelle, NP   famotidine (PEPCID) tablet 20 mg, 20 mg, Oral, Daily, Agbata, Tochukwu, MD   ferrous sulfate tablet 325 mg, 325 mg, Oral, BID WC, Agbata, Tochukwu, MD   guaiFENesin (ROBITUSSIN) 100 MG/5ML liquid 10 mL, 10 mL, Oral, Q4H PRN, Agbata, Tochukwu, MD   norepinephrine (LEVOPHED) 4mg  in 275mL (0.016 mg/mL) premix infusion, 0-40 mcg/min, Intravenous, Continuous, Lucrezia Starch, MD, Held at 01/21/22 1430   ondansetron (ZOFRAN) tablet 4 mg, 4 mg, Oral, Q6H PRN **OR** ondansetron (ZOFRAN) injection 4 mg, 4 mg, Intravenous, Q6H PRN, Agbata, Tochukwu, MD   sodium chloride flush (NS) 0.9 % injection 3 mL, 3 mL, Intravenous, Q12H, Agbata, Tochukwu, MD   sodium chloride flush (NS) 0.9 % injection 3 mL, 3 mL, Intravenous, PRN, Agbata, Tochukwu, MD   tamsulosin (FLOMAX) capsule 0.4 mg, 0.4 mg, Oral, QPC supper, Agbata, Tochukwu, MD   [COMPLETED] vancomycin (VANCOCIN) IVPB 1000 mg/200 mL premix, 1,000 mg, Intravenous, Once, Last Rate: 200 mL/hr at 01/21/22 1430, 1,000 mg at 01/21/22 1430 **FOLLOWED BY** vancomycin (VANCOCIN) IVPB 1000 mg/200 mL premix, 1,000 mg, Intravenous, Once, Darnelle Bos, RPH, Last Rate: 200 mL/hr at 01/21/22 1603, 1,000 mg at 01/21/22 1603   Vitamin D (Cholecalciferol) TABS 1 tablet, 1 tablet, Oral, Daily, Agbata, Tochukwu, MD  Current Outpatient Medications:    acetaminophen (TYLENOL) 325 MG tablet, Take 2 tablets (650 mg total) by mouth every 6 (six) hours as needed for mild pain (or Fever >/= 101)., Disp: , Rfl:     allopurinol (ZYLOPRIM) 100 MG tablet, Take 0.5 tablets (50 mg total) by mouth daily., Disp: , Rfl:    atorvastatin (LIPITOR) 20 MG tablet, Take 20 mg by mouth daily., Disp: , Rfl:    famotidine (PEPCID) 20 MG tablet, Take 1 tablet (20 mg total) by mouth daily., Disp: 60 tablet, Rfl: 1   ferrous sulfate 325 (65 FE) MG tablet, Take 325 mg by mouth 2 (two) times daily with a meal. , Disp: , Rfl: 11   guaiFENesin (ROBITUSSIN) 100 MG/5ML liquid, Take 10 mLs by mouth every 4 (four) hours as needed for cough or to loosen phlegm., Disp: 120 mL, Rfl: 0   lactulose (CHRONULAC) 10 GM/15ML solution, Take 20 g (30 mL) 3-4 times a day for a goal of at least 2 bowel movements a day., Disp: 946 mL, Rfl: 0   metoprolol succinate (TOPROL-XL) 25 MG 24  hr tablet, Take 25 mg by mouth daily., Disp: , Rfl:    ondansetron (ZOFRAN ODT) 4 MG disintegrating tablet, Take 1 tablet (4 mg total) by mouth every 8 (eight) hours as needed for nausea or vomiting., Disp: 20 tablet, Rfl: 0   pantoprazole (PROTONIX) 40 MG tablet, Take 40 mg by mouth daily., Disp: , Rfl:    tamsulosin (FLOMAX) 0.4 MG CAPS capsule, Take 1 capsule (0.4 mg total) by mouth daily after supper., Disp: 30 capsule, Rfl: 0   Vitamin D, Cholecalciferol, 1000 UNITS TABS, Take 1 tablet by mouth daily. , Disp: , Rfl:     ALLERGIES   Patient has no known allergies.     REVIEW OF SYSTEMS    Review of Systems:  Gen:  Denies  fever, sweats, chills weigh loss  HEENT: Denies blurred vision, double vision, ear pain, eye pain, hearing loss, nose bleeds, sore throat Cardiac:  No dizziness, chest pain or heaviness, chest tightness,edema Resp:   Denies cough or sputum porduction, shortness of breath,wheezing, hemoptysis,  Gi: Denies swallowing difficulty, stomach pain, nausea or vomiting, diarrhea, constipation, bowel incontinence Gu:  Denies bladder incontinence, burning urine Ext:   Denies Joint pain, stiffness or swelling Skin: Denies  skin rash, easy  bruising or bleeding or hives Endoc:  Denies polyuria, polydipsia , polyphagia or weight change Psych:   Denies depression, insomnia or hallucinations   Other:  All other systems negative   VS: BP (!) 122/52    Pulse 64    Temp 98.3 F (36.8 C) (Oral)    Resp 18    Ht 5\' 5"  (1.651 m)    Wt 90.7 kg    SpO2 94%    BMI 33.28 kg/m      PHYSICAL EXAM    GENERAL:NAD, no fevers, chills, no weakness no fatigue HEAD: Normocephalic, atraumatic.  EYES: Pupils equal, round, reactive to light. Extraocular muscles intact. No scleral icterus.  MOUTH: Moist mucosal membrane. Dentition intact. No abscess noted.  EAR, NOSE, THROAT: Clear without exudates. No external lesions.  NECK: Supple. No thyromegaly. No nodules. No JVD.  PULMONARY: right side dullness CARDIOVASCULAR: S1 and S2. Regular rate and rhythm. No murmurs, rubs, or gallops. No edema. Pedal pulses 2+ bilaterally.  GASTROINTESTINAL: Soft, nontender, nondistended. No masses. Positive bowel sounds. No hepatosplenomegaly.  MUSCULOSKELETAL: No swelling, clubbing, or edema. Range of motion full in all extremities.  NEUROLOGIC: Cranial nerves II through XII are intact. No gross focal neurological deficits. Sensation intact. Reflexes intact.  SKIN: No ulceration, lesions, rashes, or cyanosis. Skin warm and dry. Turgor intact.  PSYCHIATRIC: Mood, affect within normal limits. The patient is awake, alert and oriented x 3. Insight, judgment intact.   Had to communicate.... spanish barrier    IMAGING    CT ABDOMEN PELVIS WO CONTRAST  Result Date: 01/21/2022 CLINICAL DATA:  Left lower quadrant abdominal pain with difficulty urinating for the past 3 weeks. Diarrhea. History of end-stage renal disease on hemodialysis. EXAM: CT ABDOMEN AND PELVIS WITHOUT CONTRAST TECHNIQUE: Multidetector CT imaging of the abdomen and pelvis was performed following the standard protocol without IV contrast. RADIATION DOSE REDUCTION: This exam was performed according to  the departmental dose-optimization program which includes automated exposure control, adjustment of the mA and/or kV according to patient size and/or use of iterative reconstruction technique. COMPARISON:  Abdominopelvic CT 06/15/2021. Chest radiographs earlier today. FINDINGS: Lower chest: As seen on earlier radiographs, there is new moderate-sized laterally loculated right pleural effusion. There are some air bubbles posteriorly in  the right pleural space (image 16/2). There is compressive atelectasis or consolidation of the right lower lobe. Streaky left lower lobe pulmonary opacities are similar to the previous study. No evidence of pneumothorax. Atherosclerosis of the aorta and coronary arteries noted. Hepatobiliary: Nodular contours of the liver again noted suspicious for early cirrhosis. No focal lesion identified without contrast. No evidence of gallstones, gallbladder wall thickening or biliary dilatation. Pancreas: Unremarkable. No pancreatic ductal dilatation or surrounding inflammatory changes. Spleen: Normal in size without focal abnormality. Adrenals/Urinary Tract: Both adrenal glands appear normal. Bilateral renal cortical thinning with nonspecific perinephric soft tissue stranding again noted bilaterally. Small low-density renal lesions bilaterally are stable, likely cysts. No evidence of urinary tract calculus or hydronephrosis. The bladder appears grossly unremarkable. Stomach/Bowel: No enteric contrast administered. The stomach is poorly distended with possible generalized wall thickening, similar to the previous study. No evidence of bowel wall thickening, distention or surrounding inflammation. Vascular/Lymphatic: There are several mildly enlarged lymph nodes in the pelvis, including right iliac nodes measuring 8 mm on image 75/2 and 12 mm on image 81/2. There is a 10 mm left external iliac node on image 80/2. Aortic and branch vessel atherosclerosis without acute vascular findings on  noncontrast imaging. Reproductive: The prostate gland is enlarged. There are multiple new areas of ill-defined low-density within the gland, measuring up to 3.1 x 2.6 cm on image 92/2. No definite surrounding inflammatory changes to suggest that this is inflammatory. Mild perirectal soft tissue stranding is similar to the previous study. Other: Mild soft tissue stranding throughout the mesenteric fat without significant ascites. No free air. Musculoskeletal: No acute or significant osseous findings. Previous bilateral iliac bone osteoplasty. IMPRESSION: 1. New ill-defined low density throughout the prostate gland which could reflect prostate cancer or prostatitis/abscess formation. Correlate with serum PSA levels. Recommend urology consultation. 2. Several mildly enlarged pelvic lymph nodes suspicious for metastatic disease. 3. No evidence of hydronephrosis or focal bladder abnormality. Stable chronic renal cortical thinning consistent with chronic renal failure. 4. As seen on earlier chest radiographs, laterally loculated right pleural effusion with air in the pleural space which may indicate empyema. Correlate clinically. Compressive right lower lobe atelectasis or consolidation. 5.  Aortic Atherosclerosis (ICD10-I70.0). Electronically Signed   By: Richardean Sale M.D.   On: 01/21/2022 11:59   DG Chest 2 View  Result Date: 01/21/2022 CLINICAL DATA:  81 year old male with history of chest pain and cough. EXAM: CHEST - 2 VIEW COMPARISON:  Chest x-ray 12/09/2021. FINDINGS: Diffuse interstitial prominence and widespread peribronchial cuffing again noted. New moderate to large partially loculated right pleural effusion with increasing atelectasis and/or consolidation throughout the right mid to lower lung. Probable areas of subsegmental atelectasis at the left lung base. No definite left pleural effusion. No pneumothorax. No evidence of pulmonary edema. Heart size is mildly enlarged. Upper mediastinal contours are  within normal limits. IMPRESSION: 1. New moderate to large right pleural effusion which is likely partially loculated with extensive atelectasis and/or consolidation in the right mid to lower lung. 2. Probable subsegmental atelectasis in the left lung base. 3. Diffuse interstitial prominence and peribronchial cuffing, concerning for an acute bronchitis. 4. Mild cardiomegaly. 5. Aortic atherosclerosis. Electronically Signed   By: Vinnie Langton M.D.   On: 01/21/2022 10:48      ASSESSMENT/PLAN   Moderate size right pleural effusion, c/o abdominal pain. Partly loculated, air. ? Empyema. Not septic looking. End stage renal disease, uti  -Thoracentesis -Cell count diff, glucose, ldh, t.prot, amylase, ph, cytology, c/s, afb -quantiferon  tb ab -agree with zosyn and vanco for now -further orders per above      Thank you for allowing me to participate in the care of this patient.   Patient/Family are satisfied with care plan and all questions have been answered.  This document was prepared using Dragon voice recognition software and may include unintentional dictation errors.     Wallene Huh, M.D.  Division of [place]

## 2022-01-21 NOTE — ED Triage Notes (Signed)
Pt via POV from home. Pt is spanish speaking. Pt c/o lower abd pain and difficulty urinating for the past 3 weeks. Pt also endorses diarrhea. Pt is A&Ox4 and NAD. Pt has hx of ESRD and is a dialysis.

## 2022-01-21 NOTE — Consult Note (Signed)
Pharmacy Antibiotic Note  Martin Gilbert is a 81 y.o. male admitted on 01/21/2022 with UTI/possible empyema.  Pharmacy has been consulted for vancomycin and Zosyn dosing. Pt is ESRD on HD TTS.   Plan: Vancomycin 2 g IV loading dose, followed by 1 g IV post-HD sessions on TTS. Target predialysis concentrations of 15 to 20 mg/L  Zosyn 2.25 g IV q8h   Monitor clinical picture and vancomycin levels at steady state F/U C&S, abx deescalation / LOT   Height: 5\' 5"  (165.1 cm) Weight: 90.7 kg (200 lb) IBW/kg (Calculated) : 61.5  Temp (24hrs), Avg:98.3 F (36.8 C), Min:98.3 F (36.8 C), Max:98.3 F (36.8 C)  Recent Labs  Lab 01/21/22 1025 01/21/22 1312  WBC 12.1*  --   CREATININE 6.52*  --   LATICACIDVEN  --  1.0    Estimated Creatinine Clearance: 9.2 mL/min (A) (by C-G formula based on SCr of 6.52 mg/dL (H)).    No Known Allergies  Antimicrobials this admission: 2/6 cefepime x 1 in the ED 2/6 vancomycin >>  2/6 Zosyn >>   Dose adjustments this admission:   Microbiology results: 2/6 BCx: pending 2/6 UCx: pending   Thank you for allowing pharmacy to be a part of this patients care.  Darnelle Bos, PharmD  01/21/2022 4:15 PM

## 2022-01-21 NOTE — Consult Note (Signed)
Urology Consult  Requesting provider:Tochukwu Agbata, MD  Reason for consultation: Prostatitis versus abscess   History of Present Illness: Martin Gilbert is a 81 y.o. male with end-stage renal disease on dialysis who presented to the ED this morning complaining of pelvic pain and decreased urine output.  His daughter was present and interpreted for him.  Normally voids 4 times per day with volumes considered small Decreased urine output over the last 4 days associated with pelvic pain.  Denied fever or chills.  Bladder scan showed an estimated volume of 200 mL. Foley catheter was placed with brown, turbid urine with UA showing WBCs/RBCs, nitrite positive and significant pyuria/bacteriuria WBC slightly elevated at 12.1 Lactate normal and afebrile Prior history of BPH and urinary retention and I saw him at [facility] in 2014 CT abdomen pelvis without contrast showed prostate enlargement with a low-density area measuring ~ 3 cm in the right anterior gland Patient states pain has significantly improved after Foley catheter placement and he is only complaining of mild catheter irritation He was also found to have a loculated pleural effusion and underwent thoracentesis earlier today otherwise noncontributory except as per the HPI   Past Medical History:  Diagnosis Date   Anemia    Arthritis    Back pain    BPH (benign prostatic hyperplasia)    CKD (chronic kidney disease), stage IV ([name])    Clostridium difficile colitis    Diabetes mellitus without complication ([name])    GERD (gastroesophageal reflux disease)    HTN (hypertension)    Hyperlipidemia     Past Surgical History:  Procedure Laterality Date   A/V FISTULAGRAM Left 04/20/2018   Procedure: A/V FISTULAGRAM;  Surgeon: Algernon Huxley, MD;  Location: [place] [facility];  Service: Cardiovascular;  Laterality: Left;   A/V FISTULAGRAM Left 11/25/2018   Procedure: A/V FISTULAGRAM;  Surgeon: Algernon Huxley, MD;  Location: [place] [facility];  Service: Cardiovascular;  Laterality: Left;   A/V FISTULAGRAM Left 01/11/2019   Procedure: A/V FISTULAGRAM;  Surgeon: Algernon Huxley, MD;  Location: [place] [facility];  Service: Cardiovascular;  Laterality: Left;   AV FISTULA PLACEMENT Left 06/13/2017   Procedure: ARTERIOVENOUS (AV) FISTULA CREATION ( RADIAL CEPHALIC );  Surgeon: Katha Cabal, MD;  Location: [facility];  Service: Vascular;  Laterality: Left;   CATARACT EXTRACTION     one eye not sure which   DIALYSIS/PERMA CATHETER INSERTION N/A 04/03/2017   Procedure: Dialysis/Perma Catheter Insertion;  Surgeon: Algernon Huxley, MD;  Location: [place] [facility];  Service: Cardiovascular;  Laterality: N/A;   DIALYSIS/PERMA CATHETER INSERTION N/A 08/06/2017   Procedure: DIALYSIS/PERMA CATHETER INSERTION;  Surgeon: Algernon Huxley, MD;  Location: [place] [facility];  Service: Cardiovascular;  Laterality: N/A;   DIALYSIS/PERMA CATHETER REMOVAL N/A 08/04/2017   Procedure: DIALYSIS/PERMA CATHETER REMOVAL;  Surgeon: Algernon Huxley, MD;  Location: [place] [facility];  Service: Cardiovascular;  Laterality: N/A;   DIALYSIS/PERMA CATHETER REMOVAL N/A 12/31/2017   Procedure: DIALYSIS/PERMA CATHETER REMOVAL;  Surgeon: Algernon Huxley, MD;  Location: [place] [facility];  Service: Cardiovascular;  Laterality: N/A;   hernia repair     IR THORACENTESIS ASP PLEURAL SPACE W/IMG GUIDE  01/21/2022   SACROPLASTY N/A 09/30/2018   Procedure: SACROPLASTY;  Surgeon: Hessie Knows, MD;  Location: [facility];  Service: Orthopedics;  Laterality: N/A;   UPPER EXTREMITY ANGIOGRAPHY Left 09/05/2017   Procedure: Upper Extremity Angiography;  Surgeon: Katha Cabal, MD;  Location: [place]  [facility];  Service: Cardiovascular;  Laterality: Left;    Home Medications:  Current Meds  Medication Sig   acetaminophen (TYLENOL) 325 MG tablet Take 2 tablets (650 mg total) by mouth every 6 (six) hours as needed for mild pain (or Fever >/= 101).   allopurinol  (ZYLOPRIM) 100 MG tablet Take 0.5 tablets (50 mg total) by mouth daily. (Patient taking differently: Take 100 mg by mouth daily.)   ferrous sulfate 325 (65 FE) MG tablet Take 325 mg by mouth 2 (two) times daily with a meal.    hydrALAZINE (APRESOLINE) 50 MG tablet Take 50 mg by mouth 3 (three) times daily.   lactulose (CHRONULAC) 10 GM/15ML solution Take 20 g (30 mL) 3-4 times a day for a goal of at least 2 bowel movements a day.   metoprolol succinate (TOPROL-XL) 25 MG 24 hr tablet Take 25 mg by mouth daily.   pantoprazole (PROTONIX) 40 MG tablet Take 40 mg by mouth daily.   tamsulosin (FLOMAX) 0.4 MG CAPS capsule Take 1 capsule (0.4 mg total) by mouth daily after supper.    Allergies: No Known Allergies  Family History  Problem Relation Age of Onset   Hypertension Mother    Diabetes Neg Hx     Social History:  reports that he has never smoked. He has never used smokeless tobacco. He reports that he does not drink alcohol and does not use drugs.  ROS: A complete review of systems was performed.  All systems are negative except for pertinent findings as noted.  Physical Exam:  Vital signs in last 24 hours: Temp:  [97.8 F (36.6 C)-98.7 F (37.1 C)] 97.8 F (36.6 C) (02/06 1948) Pulse Rate:  [63-73] 70 (02/06 1948) Resp:  [16-21] 17 (02/06 1948) BP: (109-130)/(40-60) 109/43 (02/06 1948) SpO2:  [93 %-99 %] 99 % (02/06 1948) Weight:  [90.7 kg] 90.7 kg (02/06 0938) Constitutional:  Alert and oriented, No acute distress HEENT: [place] AT, moist mucus membranes.  Trachea midline, no masses GI: Mild lower abdominal tenderness GU: Foley catheter draining brownish urine Skin: No rashes, bruises or suspicious lesions  Laboratory Data:  Recent Labs    01/21/22 1025  WBC 12.1*  HGB 7.7*  HCT 23.8*   Recent Labs    01/21/22 1025  NA 130*  K 3.9  CL 96*  CO2 24  GLUCOSE 121*  BUN 69*  CREATININE 6.52*  CALCIUM 7.9*   Recent Labs    01/21/22 1312  INR 1.2   No results for  input(s): LABURIN in the last 72 hours. Results for orders placed or performed during the hospital encounter of 12/09/21  Resp Panel by RT-PCR (Flu A&B, Covid) Nasopharyngeal Swab     Status: None   Collection Time: 12/09/21  3:07 PM   Specimen: Nasopharyngeal Swab; Nasopharyngeal(NP) swabs in vial transport medium  Result Value Ref Range Status   SARS Coronavirus 2 by RT PCR NEGATIVE NEGATIVE Final    Comment: (NOTE) SARS-CoV-2 target nucleic acids are NOT DETECTED.  The SARS-CoV-2 RNA is generally detectable in upper respiratory specimens during the acute phase of infection. The lowest concentration of SARS-CoV-2 viral copies this assay can detect is 138 copies/mL. A negative result does not preclude SARS-Cov-2 infection and should not be used as the sole basis for treatment or other patient management decisions. A negative result may occur with  improper specimen collection/handling, submission of specimen other than nasopharyngeal swab, presence of viral mutation(s) within the areas targeted by this assay, and inadequate  number of viral copies(<138 copies/mL). A negative result must be combined with clinical observations, patient history, and epidemiological information. The expected result is Negative.  Fact Sheet for Patients:  EntrepreneurPulse.com.au  Fact Sheet for Healthcare Providers:  IncredibleEmployment.be  This test is no t yet approved or cleared by the [place] FDA and  has been authorized for detection and/or diagnosis of SARS-CoV-2 by FDA under an Emergency Use Authorization (EUA). This EUA will remain  in effect (meaning this test can be used) for the duration of the COVID-19 declaration under Section 564(b)(1) of the Act, 21 U.S.C.section 360bbb-3(b)(1), unless the authorization is terminated  or revoked sooner.       Influenza A by PCR NEGATIVE NEGATIVE Final   Influenza B by PCR NEGATIVE NEGATIVE Final    Comment:  (NOTE) The Xpert Xpress SARS-CoV-2/FLU/RSV plus assay is intended as an aid in the diagnosis of influenza from Nasopharyngeal swab specimens and should not be used as a sole basis for treatment. Nasal washings and aspirates are unacceptable for Xpert Xpress SARS-CoV-2/FLU/RSV testing.  Fact Sheet for Patients: EntrepreneurPulse.com.au  Fact Sheet for Healthcare Providers: IncredibleEmployment.be  This test is not yet approved or cleared by the [place] FDA and has been authorized for detection and/or diagnosis of SARS-CoV-2 by FDA under an Emergency Use Authorization (EUA). This EUA will remain in effect (meaning this test can be used) for the duration of the COVID-19 declaration under Section 564(b)(1) of the Act, 21 U.S.C. section 360bbb-3(b)(1), unless the authorization is terminated or revoked.  Performed at [facility], [address], [place], [place] [address]      Radiologic Imaging: CT images were personally reviewed and interpreted.  The hypodense area in the prostate has Hounsfield units consistent with fluid and primarily water density similar to the bladder.  There were similar findings on a CT in April 2022 and a follow-up CT July 2022 showed resolution  CT ABDOMEN PELVIS WO CONTRAST  Result Date: 01/21/2022 CLINICAL DATA:  Left lower quadrant abdominal pain with difficulty urinating for the past 3 weeks. Diarrhea. History of end-stage renal disease on hemodialysis. EXAM: CT ABDOMEN AND PELVIS WITHOUT CONTRAST TECHNIQUE: Multidetector CT imaging of the abdomen and pelvis was performed following the standard protocol without IV contrast. RADIATION DOSE REDUCTION: This exam was performed according to the departmental dose-optimization program which includes automated exposure control, adjustment of the mA and/or kV according to patient size and/or use of iterative reconstruction technique. COMPARISON:  Abdominopelvic CT  06/15/2021. Chest radiographs earlier today. FINDINGS: Lower chest: As seen on earlier radiographs, there is new moderate-sized laterally loculated right pleural effusion. There are some air bubbles posteriorly in the right pleural space (image 16/2). There is compressive atelectasis or consolidation of the right lower lobe. Streaky left lower lobe pulmonary opacities are similar to the previous study. No evidence of pneumothorax. Atherosclerosis of the aorta and coronary arteries noted. Hepatobiliary: Nodular contours of the liver again noted suspicious for early cirrhosis. No focal lesion identified without contrast. No evidence of gallstones, gallbladder wall thickening or biliary dilatation. Pancreas: Unremarkable. No pancreatic ductal dilatation or surrounding inflammatory changes. Spleen: Normal in size without focal abnormality. Adrenals/Urinary Tract: Both adrenal glands appear normal. Bilateral renal cortical thinning with nonspecific perinephric soft tissue stranding again noted bilaterally. Small low-density renal lesions bilaterally are stable, likely cysts. No evidence of urinary tract calculus or hydronephrosis. The bladder appears grossly unremarkable. Stomach/Bowel: No enteric contrast administered. The stomach is poorly distended with possible generalized wall thickening, similar  to the previous study. No evidence of bowel wall thickening, distention or surrounding inflammation. Vascular/Lymphatic: There are several mildly enlarged lymph nodes in the pelvis, including right iliac nodes measuring 8 mm on image 75/2 and 12 mm on image 81/2. There is a 10 mm left external iliac node on image 80/2. Aortic and branch vessel atherosclerosis without acute vascular findings on noncontrast imaging. Reproductive: The prostate gland is enlarged. There are multiple new areas of ill-defined low-density within the gland, measuring up to 3.1 x 2.6 cm on image 92/2. No definite surrounding inflammatory changes to  suggest that this is inflammatory. Mild perirectal soft tissue stranding is similar to the previous study. Other: Mild soft tissue stranding throughout the mesenteric fat without significant ascites. No free air. Musculoskeletal: No acute or significant osseous findings. Previous bilateral iliac bone osteoplasty. IMPRESSION: 1. New ill-defined low density throughout the prostate gland which could reflect prostate cancer or prostatitis/abscess formation. Correlate with serum PSA levels. Recommend urology consultation. 2. Several mildly enlarged pelvic lymph nodes suspicious for metastatic disease. 3. No evidence of hydronephrosis or focal bladder abnormality. Stable chronic renal cortical thinning consistent with chronic renal failure. 4. As seen on earlier chest radiographs, laterally loculated right pleural effusion with air in the pleural space which may indicate empyema. Correlate clinically. Compressive right lower lobe atelectasis or consolidation. 5.  Aortic Atherosclerosis (ICD10-I70.0). Electronically Signed   By: Richardean Sale M.D.   On: 01/21/2022 11:59   DG Chest 2 View  Result Date: 01/21/2022 CLINICAL DATA:  81 year old male with history of chest pain and cough. EXAM: CHEST - 2 VIEW COMPARISON:  Chest x-ray 12/09/2021. FINDINGS: Diffuse interstitial prominence and widespread peribronchial cuffing again noted. New moderate to large partially loculated right pleural effusion with increasing atelectasis and/or consolidation throughout the right mid to lower lung. Probable areas of subsegmental atelectasis at the left lung base. No definite left pleural effusion. No pneumothorax. No evidence of pulmonary edema. Heart size is mildly enlarged. Upper mediastinal contours are within normal limits. IMPRESSION: 1. New moderate to large right pleural effusion which is likely partially loculated with extensive atelectasis and/or consolidation in the right mid to lower lung. 2. Probable subsegmental atelectasis  in the left lung base. 3. Diffuse interstitial prominence and peribronchial cuffing, concerning for an acute bronchitis. 4. Mild cardiomegaly. 5. Aortic atherosclerosis. Electronically Signed   By: Vinnie Langton M.D.   On: 01/21/2022 10:48   DG Lumbar Spine 2-3 Views  Result Date: 01/21/2022 CLINICAL DATA:  Back pain EXAM: LUMBAR SPINE - 2-3 VIEW COMPARISON:  10/11/2018 FINDINGS: No recent fracture is seen. There is previous vertebroplasty in the iliac bones on both sides. Degenerative changes are noted with bony spurs and facet hypertrophy. There is no significant disc space narrowing. Arterial calcifications are seen in the aorta and its major branches. IMPRESSION: No recent fracture is seen. Lumbar spondylosis. No significant interval changes are noted. Electronically Signed   By: Elmer Picker M.D.   On: 01/21/2022 16:48   DG Chest Port 1 View  Result Date: 01/21/2022 CLINICAL DATA:  Back pain. Kidney disease with current UTI. Post thoracentesis. EXAM: PORTABLE CHEST 1 VIEW COMPARISON:  AP chest 01/21/2022, CT chest 02/18/2017, AP chest 12/09/2021 FINDINGS: Cardiac silhouette appears mildly enlarged. Mediastinal contours are within normal limits. Mild calcification within aortic arch. There is not a definite change in the increased density apparent represent a taller than wide loculated pleural effusion of the inferolateral right hemithorax. Heterogeneous airspace opacification within the right mid and lower  lung. No definite left pleural effusion. No pneumothorax. No definite interstitial pulmonary edema. Moderate multilevel degenerative disc changes of the thoracic spine. IMPRESSION: Persistent moderate-to-large loculated right pleural effusion involving the inferolateral right hemithorax. Electronically Signed   By: Yvonne Kendall M.D.   On: 01/21/2022 16:49   IR THORACENTESIS ASP PLEURAL SPACE W/IMG GUIDE  Result Date: 01/21/2022 INDICATION: Patient with history of ESRD who presented to the  ED with abdominal pain and difficulty urinating found to have large right loculated pleural effusion. Patient noted to have right sided rib fractures on CT abdomen/pelvis today as well. Request to IR for diagnostic right thoracentesis. EXAM: ULTRASOUND GUIDED RIGHT THORACENTESIS MEDICATIONS: 6 mL 1% lidocaine COMPLICATIONS: None immediate. PROCEDURE: An ultrasound guided thoracentesis was thoroughly discussed with the patient and questions answered. The benefits, risks, alternatives and complications were also discussed. The patient understands and wishes to proceed with the procedure. Written consent was obtained. Ultrasound was performed to localize and mark an adequate pocket of fluid in the right chest. The area was then prepped and draped in the normal sterile fashion. 1% Lidocaine was used for local anesthesia. Under ultrasound guidance a 6 Fr Safe-T-Centesis catheter was introduced. Thoracentesis was performed. The catheter was removed and a dressing applied. FINDINGS: A total of approximately 10 mL of serosanguineous fluid was removed. Samples were sent to the laboratory as requested by the clinical team. IMPRESSION: Successful ultrasound guided right thoracentesis yielding 10 mL of pleural fluid. Appearance of the right pleural fluid on ultrasound is compatible with a posttraumatic hemothorax, which would be compatible with the patient's posterior rib fractures identified on recent CT. This would not be amenable to further image guided drainage. Consider surgical consultation for further management. Read by Candiss Norse, PA-C Electronically Signed   By: Albin Felling M.D.   On: 01/21/2022 16:19    Impression/Assessment:   1.  UTI most likely secondary to prostatitis He is afebrile and lactate normal There is a fluid density area in the prostate which could potentially represent a prostatic abscess  2.  Urinary retention Symptoms improved with catheter drainage  Plan:  He is afebrile and  lactate normal and looks well clinically Would recommend broad-spectrum IV antibiotics pending urine culture If he responds to antibiotics we will obtain follow-up imaging.  Imaging earlier should he not improve or show clinical deterioration A PSA was ordered in the ED however with active infection will not be of value   01/21/2022, 9:11 PM  John Giovanni,  MD

## 2022-01-21 NOTE — H&P (Addendum)
History and Physical    Patient: Martin Gilbert PIR:[id number] DOB: Nov 13, 1941 DOA: 01/21/2022 DOS: the patient was seen and examined on 01/21/2022 PCP: [name], MD  Patient coming from: Home  Chief Complaint:  Chief Complaint  Patient presents with   Abdominal Pain  Most of the history was obtained from patient's daughter at the bedside.  Patient is only Spanish-speaking.  HPI: Martin Gilbert is a 81 y.o. male with medical history significant of ESRD on hemodialysis, hypertension, diabetes mellitus who presents to the ER for evaluation of lower abdominal pain and difficulty voiding for about 3 weeks. Patient continues to void despite being on dialysis and has noted a decrease in his urine output.  He also complains of some low back pain and has bilateral lower extremity swelling. Per his daughter he was recently hospitalized in December and at that time he had a cough.  She states that the cough has not resolved since that hospitalization in December. He has a wet sounding cough but denies having any fever, no chills, no chest pain or SOB. She states that he has been increasingly weak and has been unable to ambulate.   Review of Systems: As mentioned in the history of present illness. All other systems reviewed and are negative. Past Medical History:  Diagnosis Date   Anemia    Arthritis    Back pain    BPH (benign prostatic hyperplasia)    CKD (chronic kidney disease), stage IV ([name])    Clostridium difficile colitis    Diabetes mellitus without complication ([name])    GERD (gastroesophageal reflux disease)    HTN (hypertension)    Hyperlipidemia    Past Surgical History:  Procedure Laterality Date   A/V FISTULAGRAM Left 04/20/2018   Procedure: A/V FISTULAGRAM;  Surgeon: Algernon Huxley, MD;  Location: [facility];  Service: Cardiovascular;  Laterality: Left;   A/V FISTULAGRAM Left 11/25/2018   Procedure: A/V FISTULAGRAM;  Surgeon: Algernon Huxley, MD;   Location: [facility];  Service: Cardiovascular;  Laterality: Left;   A/V FISTULAGRAM Left 01/11/2019   Procedure: A/V FISTULAGRAM;  Surgeon: Algernon Huxley, MD;  Location: [facility];  Service: Cardiovascular;  Laterality: Left;   AV FISTULA PLACEMENT Left 06/13/2017   Procedure: ARTERIOVENOUS (AV) FISTULA CREATION ( RADIAL CEPHALIC );  Surgeon: Katha Cabal, MD;  Location: [facility];  Service: Vascular;  Laterality: Left;   CATARACT EXTRACTION     one eye not sure which   DIALYSIS/PERMA CATHETER INSERTION N/A 04/03/2017   Procedure: Dialysis/Perma Catheter Insertion;  Surgeon: Algernon Huxley, MD;  Location: [facility];  Service: Cardiovascular;  Laterality: N/A;   DIALYSIS/PERMA CATHETER INSERTION N/A 08/06/2017   Procedure: DIALYSIS/PERMA CATHETER INSERTION;  Surgeon: Algernon Huxley, MD;  Location: [facility];  Service: Cardiovascular;  Laterality: N/A;   DIALYSIS/PERMA CATHETER REMOVAL N/A 08/04/2017   Procedure: DIALYSIS/PERMA CATHETER REMOVAL;  Surgeon: Algernon Huxley, MD;  Location: [facility];  Service: Cardiovascular;  Laterality: N/A;   DIALYSIS/PERMA CATHETER REMOVAL N/A 12/31/2017   Procedure: DIALYSIS/PERMA CATHETER REMOVAL;  Surgeon: Algernon Huxley, MD;  Location: [facility];  Service: Cardiovascular;  Laterality: N/A;   hernia repair     SACROPLASTY N/A 09/30/2018   Procedure: SACROPLASTY;  Surgeon: Hessie Knows, MD;  Location: [facility];  Service: Orthopedics;  Laterality: N/A;   UPPER EXTREMITY ANGIOGRAPHY Left 09/05/2017   Procedure: Upper Extremity Angiography;  Surgeon: Katha Cabal,  MD;  Location: [facility];  Service: Cardiovascular;  Laterality: Left;   Social History:  reports that he has never smoked. He has never used smokeless tobacco. He reports that he does not drink alcohol and does not use drugs.  No Known Allergies  Family History  Problem Relation Age of Onset   Hypertension Mother    Diabetes  Neg Hx     Prior to Admission medications   Medication Sig Start Date End Date Taking? Authorizing Provider  acetaminophen (TYLENOL) 325 MG tablet Take 2 tablets (650 mg total) by mouth every 6 (six) hours as needed for mild pain (or Fever >/= 101). 11/28/17   [name], MD  allopurinol (ZYLOPRIM) 100 MG tablet Take 0.5 tablets (50 mg total) by mouth daily. 10/29/21   Wouk, Ailene Rud, MD  atorvastatin (LIPITOR) 20 MG tablet Take 20 mg by mouth daily.    [provider]  famotidine (PEPCID) 20 MG tablet Take 1 tablet (20 mg total) by mouth daily. 04/16/21 04/16/22  Eugenie Filler, MD  ferrous sulfate 325 (65 FE) MG tablet Take 325 mg by mouth 2 (two) times daily with a meal.  08/30/15   [provider]  guaiFENesin (ROBITUSSIN) 100 MG/5ML liquid Take 10 mLs by mouth every 4 (four) hours as needed for cough or to loosen phlegm. 12/11/21   Fritzi Mandes, MD  lactulose (CHRONULAC) 10 GM/15ML solution Take 20 g (30 mL) 3-4 times a day for a goal of at least 2 bowel movements a day. 04/28/21   Eugenie Filler, MD  metoprolol succinate (TOPROL-XL) 25 MG 24 hr tablet Take 25 mg by mouth daily.    [provider]  ondansetron (ZOFRAN ODT) 4 MG disintegrating tablet Take 1 tablet (4 mg total) by mouth every 8 (eight) hours as needed for nausea or vomiting. 06/15/21   Naaman Plummer, MD  pantoprazole (PROTONIX) 40 MG tablet Take 40 mg by mouth daily. 10/10/21   [provider]  tamsulosin (FLOMAX) 0.4 MG CAPS capsule Take 1 capsule (0.4 mg total) by mouth daily after supper. 02/19/17   Epifanio Lesches, MD  Vitamin D, Cholecalciferol, 1000 UNITS TABS Take 1 tablet by mouth daily.     [provider]    Physical Exam: Vitals:   01/21/22 1400 01/21/22 1415 01/21/22 1430 01/21/22 1500  BP: (!) 115/44 (!) 118/44 (!) 120/44 (!) 122/52  Pulse: 65 63 64 64  Resp:      Temp:      TempSrc:      SpO2: 97% 96% 97% 94%  Weight:      Height:       Physical  Exam Constitutional:      Appearance: He is normal weight.     Comments: Chronically ill-appearing  HENT:     Head: Normocephalic.     Mouth/Throat:     Comments: Dry mucous membranes Eyes:     Comments: Pale conjunctiva  Cardiovascular:     Rate and Rhythm: Normal rate.  Pulmonary:     Effort: Pulmonary effort is normal.     Comments: Decreased breath sounds right middle to lower lobe Abdominal:     General: Abdomen is flat. Bowel sounds are normal.     Tenderness: There is abdominal tenderness.     Comments: Suprapubic tenderness.  Foley catheter draining dirty looking urine  Musculoskeletal:     Right lower leg: Edema present.     Left lower leg: Edema present.  Skin:  General: Skin is warm and dry.  Neurological:     Mental Status: He is alert.     Comments: Unable to assess  Psychiatric:     Comments: Depressed mood and affect     Data Reviewed: Labs reviewed White count 12.1, hemoglobin 7.7 compared to baseline of 8.8 BUN 69, creatinine 6.52 consistent with his history of end-stage renal disease on hemodialysis Urine analysis shows pyuria Lactic acid is 1.0 Chest x-ray reviewed by me shows new moderate to large right pleural effusion which is likely partially loculated with extensive atelectasis and/or consolidation in the right mid to lower lung.  Diffuse interstitial prominence and peribronchial cuffing concerning for an acute bronchitis. CT scan of abdomen pelvis shows a new ill-defined low density throughout the prostate gland which could reflect prostate cancer or prostatitis/abscess formation.  Several mildly enlarged pelvic lymph nodes suspicious for metastatic disease.  No evidence of hydronephrosis or focal bladder abnormality. Patient is scheduled for thoracentesis and fluid studies are pending  Results are pending, will review when available.  Assessment and Plan: Principal Problem:   UTI (urinary tract infection) Active Problems:   Generalized  weakness   Anemia of chronic disease   ESRD (end stage renal disease) ([name])   Diabetes mellitus, type II ([name])   Pleural effusion on right    Urinary tract infection Patient noted to have significant pyuria with associated leukocytosis. He has a history of end-stage renal disease but continues to void Bladder scan yielded about 200 cc of urine and a Foley catheter was placed draining dirty urine Imaging shows an ill-defined low-density throughout the prostate gland which could reflect prostate cancer or prostatitis/abscess formation. We will place patient empirically on vancomycin and meropenem     Right-sided pleural effusion Patient with complaints of several weeks of a productive cough but has been afebrile Chest x-ray shows a new moderate to large right pleural effusion which is partially loculated with extensive atelectasis or consolidation in the right mid to lower lung. IR consult for thoracentesis. 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 CT surgery consult has been requested Called and spoke to CT surgeon at [facility] who recommends pigtail drainage and pulmonary to manage. Place patient empirically on vancomycin and Zosyn for possible pneumonia We will request pulmonary consult Supportive care with antitussives      Diabetes mellitus with complications of end-stage renal disease on hemodialysis Maintain consistent carbohydrate diet Blood sugar checks before meals and at bedtime Nephrology consult for renal replacement therapy Patient's dialysis days are T/TH/S     Anemia of end-stage renal disease Patient has a hemoglobin of 7.7 compared to baseline of 8.8 Monitor H&H closely during this hospitalization  Advance Care Planning:   Code Status: Full Code   Consults: Interventional radiology/CT surgery  Family Communication: Greater than 50% of  time was spent discussing patient's condition and plan of care with her at the bedside.  All questions and concerns have been addressed.  She verbalizes understanding and agrees with the plan.  Severity of Illness: The appropriate patient status for this patient is INPATIENT. Inpatient status is judged to be reasonable and necessary in order to provide the required intensity of service to ensure the patient's safety. The patient's presenting symptoms, physical exam findings, and initial radiographic and laboratory data in the context of their chronic comorbidities is felt to place them at high risk for further clinical deterioration. Furthermore, it  is not anticipated that the patient will be medically stable for discharge from the hospital within 2 midnights of admission.   * I certify that at the point of admission it is my clinical judgment that the patient will require inpatient hospital care spanning beyond 2 midnights from the point of admission due to high intensity of service, high risk for further deterioration and high frequency of surveillance required.*  Author: Collier Bullock, MD 01/21/2022 3:45 PM  For on call review [email or web address].

## 2022-01-21 NOTE — Consult Note (Signed)
PHARMACY -  BRIEF ANTIBIOTIC NOTE   Pharmacy has received consult(s) for vancomycin and cefepime from an ED provider.  The patient's profile has been reviewed for ht/wt/allergies/indication/available labs.    One time order(s) placed for cefepime 2 g and vancomycin 2 g IV   Further antibiotics/pharmacy consults should be ordered by admitting physician if indicated.                       Thank you, Darnelle Bos, PharmD 01/21/2022  12:21 PM

## 2022-01-22 ENCOUNTER — Inpatient Hospital Stay: Payer: Medicare Other

## 2022-01-22 ENCOUNTER — Inpatient Hospital Stay (HOSPITAL_COMMUNITY)
Admit: 2022-01-22 | Discharge: 2022-01-22 | Disposition: A | Discharge status: Home / Self Care | Payer: Medicare Other | Attending: Internal Medicine | Admitting: Internal Medicine

## 2022-01-22 DIAGNOSIS — R102 Pelvic and perineal pain: Secondary | ICD-10-CM

## 2022-01-22 DIAGNOSIS — R3912 Poor urinary stream: Secondary | ICD-10-CM

## 2022-01-22 DIAGNOSIS — Z992 Dependence on renal dialysis: Secondary | ICD-10-CM | POA: Diagnosis not present

## 2022-01-22 DIAGNOSIS — N186 End stage renal disease: Secondary | ICD-10-CM | POA: Diagnosis not present

## 2022-01-22 DIAGNOSIS — I5031 Acute diastolic (congestive) heart failure: Secondary | ICD-10-CM | POA: Diagnosis not present

## 2022-01-22 DIAGNOSIS — R9389 Abnormal findings on diagnostic imaging of other specified body structures: Secondary | ICD-10-CM

## 2022-01-22 DIAGNOSIS — N3000 Acute cystitis without hematuria: Secondary | ICD-10-CM | POA: Diagnosis not present

## 2022-01-22 LAB — CBC
HCT: 21.7 % — ABNORMAL LOW (ref 39.0–52.0)
Hemoglobin: 7.2 g/dL — ABNORMAL LOW (ref 13.0–17.0)
MCH: 31.4 pg (ref 26.0–34.0)
MCHC: 33.2 g/dL (ref 30.0–36.0)
MCV: 94.8 fL (ref 80.0–100.0)
Platelets: 216 10*3/uL (ref 150–400)
RBC: 2.29 MIL/uL — ABNORMAL LOW (ref 4.22–5.81)
RDW: 17.2 % — ABNORMAL HIGH (ref 11.5–15.5)
WBC: 8.1 10*3/uL (ref 4.0–10.5)
nRBC: 0 % (ref 0.0–0.2)

## 2022-01-22 LAB — HEMOGLOBIN AND HEMATOCRIT, BLOOD
HCT: 21.8 % — ABNORMAL LOW (ref 39.0–52.0)
Hemoglobin: 7.2 g/dL — ABNORMAL LOW (ref 13.0–17.0)

## 2022-01-22 LAB — GLUCOSE, BODY FLUID OTHER: Glucose, Body Fluid Other: 46 mg/dL

## 2022-01-22 LAB — PROTEIN, BODY FLUID (OTHER): Total Protein, Body Fluid Other: 4.3 g/dL

## 2022-01-22 LAB — GLUCOSE, CAPILLARY
Glucose-Capillary: 100 mg/dL — ABNORMAL HIGH (ref 70–99)
Glucose-Capillary: 118 mg/dL — ABNORMAL HIGH (ref 70–99)
Glucose-Capillary: 117 mg/dL — ABNORMAL HIGH (ref 70–99)
Glucose-Capillary: 172 mg/dL — ABNORMAL HIGH (ref 70–99)

## 2022-01-22 LAB — AMYLASE, BODY FLUID (OTHER): Amylase, Body Fluid: 20 U/L

## 2022-01-22 LAB — BASIC METABOLIC PANEL
Anion gap: 10 (ref 5–15)
BUN: 74 mg/dL — ABNORMAL HIGH (ref 8–23)
CO2: 23 mmol/L (ref 22–32)
Calcium: 7.8 mg/dL — ABNORMAL LOW (ref 8.9–10.3)
Chloride: 97 mmol/L — ABNORMAL LOW (ref 98–111)
Creatinine, Ser: 7.52 mg/dL — ABNORMAL HIGH (ref 0.61–1.24)
GFR, Estimated: 7 mL/min — ABNORMAL LOW (ref >60)
Glucose, Bld: 103 mg/dL — ABNORMAL HIGH (ref 70–99)
Potassium: 4.3 mmol/L (ref 3.5–5.1)
Sodium: 130 mmol/L — ABNORMAL LOW (ref 135–145)

## 2022-01-22 LAB — HEPATITIS B SURFACE ANTIGEN: Hepatitis B Surface Ag: NONREACTIVE

## 2022-01-22 LAB — PREPARE RBC (CROSSMATCH)

## 2022-01-22 LAB — HEPATITIS B SURFACE ANTIBODY,QUALITATIVE: Hep B S Ab: REACTIVE — AB

## 2022-01-22 MED ORDER — ALTEPLASE 2 MG IJ SOLR
2.0000 mg | Freq: Once | INTRAMUSCULAR | Status: DC | PRN
  Filled 2022-01-22: qty 2

## 2022-01-22 MED ORDER — MIDAZOLAM HCL 2 MG/2ML IJ SOLN
INTRAMUSCULAR | Status: AC
  Filled 2022-01-22: qty 2

## 2022-01-22 MED ORDER — FENTANYL CITRATE (PF) 100 MCG/2ML IJ SOLN
INTRAMUSCULAR | Status: AC | PRN
  Administered 2022-01-22: 25 ug via INTRAVENOUS

## 2022-01-22 MED ORDER — MELATONIN 5 MG PO TABS
2.5000 mg | ORAL_TABLET | Freq: Every evening | ORAL | Status: DC | PRN
  Administered 2022-01-26 – 2022-02-03 (×3): 2.5 mg via ORAL
  Filled 2022-01-22 (×4): qty 1

## 2022-01-22 MED ORDER — SODIUM CHLORIDE 0.9 % IV SOLN: 100.0000 mL | INTRAVENOUS | Status: DC | PRN

## 2022-01-22 MED ORDER — SODIUM CHLORIDE 0.9% IV SOLUTION: Freq: Once | INTRAVENOUS | Status: DC

## 2022-01-22 MED ORDER — LIDOCAINE-PRILOCAINE 2.5-2.5 % EX CREA
1.0000 "application " | TOPICAL_CREAM | CUTANEOUS | Status: DC | PRN
  Filled 2022-01-22: qty 5

## 2022-01-22 MED ORDER — OXYCODONE HCL 5 MG PO TABS
5.0000 mg | ORAL_TABLET | Freq: Four times a day (QID) | ORAL | Status: DC | PRN
  Administered 2022-01-23 – 2022-01-31 (×13): 5 mg via ORAL
  Filled 2022-01-22 (×14): qty 1

## 2022-01-22 MED ORDER — EPOETIN ALFA 10000 UNIT/ML IJ SOLN
10000.0000 [IU] | INTRAMUSCULAR | Status: DC
  Administered 2022-01-22 – 2022-02-02 (×6): 10000 [IU] via INTRAVENOUS
  Filled 2022-01-22 (×4): qty 1

## 2022-01-22 MED ORDER — PENTAFLUOROPROP-TETRAFLUOROETH EX AERO
1.0000 "application " | INHALATION_SPRAY | CUTANEOUS | Status: DC | PRN
  Filled 2022-01-22: qty 30

## 2022-01-22 MED ORDER — SENNOSIDES-DOCUSATE SODIUM 8.6-50 MG PO TABS
2.0000 | ORAL_TABLET | Freq: Every day | ORAL | Status: DC
  Administered 2022-01-24 – 2022-02-03 (×8): 2 via ORAL
  Filled 2022-01-22 (×8): qty 2

## 2022-01-22 MED ORDER — PROCHLORPERAZINE EDISYLATE 10 MG/2ML IJ SOLN
5.0000 mg | Freq: Four times a day (QID) | INTRAMUSCULAR | Status: DC | PRN
  Filled 2022-01-22: qty 2
  Filled 2022-01-22: qty 1

## 2022-01-22 MED ORDER — HEPARIN SODIUM (PORCINE) 5000 UNIT/ML IJ SOLN
5000.0000 [IU] | Freq: Three times a day (TID) | INTRAMUSCULAR | Status: DC
  Administered 2022-01-23 – 2022-02-05 (×39): 5000 [IU] via SUBCUTANEOUS
  Filled 2022-01-22 (×38): qty 1

## 2022-01-22 MED ORDER — LIDOCAINE HCL (PF) 1 % IJ SOLN
5.0000 mL | INTRAMUSCULAR | Status: DC | PRN
  Filled 2022-01-22: qty 5

## 2022-01-22 MED ORDER — HYDROMORPHONE HCL 1 MG/ML IJ SOLN
0.5000 mg | INTRAMUSCULAR | Status: DC | PRN
  Administered 2022-01-22 – 2022-01-23 (×3): 0.5 mg via INTRAVENOUS
  Filled 2022-01-22: qty 1
  Filled 2022-01-22 (×2): qty 0.5

## 2022-01-22 MED ORDER — FENTANYL CITRATE (PF) 100 MCG/2ML IJ SOLN
INTRAMUSCULAR | Status: AC
  Filled 2022-01-22: qty 2

## 2022-01-22 MED ORDER — HEPARIN SODIUM (PORCINE) 1000 UNIT/ML DIALYSIS
1000.0000 [IU] | INTRAMUSCULAR | Status: DC | PRN
  Filled 2022-01-22: qty 1

## 2022-01-22 NOTE — Progress Notes (Signed)
Patient clinically stable/unchanged post Right CT placement per Dr Annamaria Boots, only local anesthetic and Fentanyl 25 mcg IV given for procedure. Tolerated well. Son with patient along with interpreter pre and post procedure. Bedside report given to Margaretmary Lombard RN on 1a post procedure with questions answered.

## 2022-01-22 NOTE — Progress Notes (Signed)
Patient transferred to 2A, Report given to valerie, Therapist, sports.

## 2022-01-22 NOTE — Procedures (Signed)
Interventional Radiology Procedure Note  Procedure: CT 14 FR RT CHEST TUBE INSERTION    Complications: None  Estimated Blood Loss:  0  Findings: 14 FR TUBE INSERTED CX SENT    Tamera Punt, MD

## 2022-01-22 NOTE — Progress Notes (Signed)
Pulmonary Medicine          Date: 01/22/2022,   MRN# [id number] Martin Gilbert 05/25/1941    HISTORY OF PRESENT ILLNESS   This is an 81 yo spanish speaking male. His pleural effusion looks more as a loculated hemorraghic, rib fractures. ? Post traumatic hemothorax. Only ten cc bloody effusion removed. Ct guided pigtail placement planned today.    Urology  and renal following uti and renal failure on dialysis respectively.   PAST MEDICAL HISTORY   Past Medical History:  Diagnosis Date   Anemia    Arthritis    Back pain    BPH (benign prostatic hyperplasia)    CKD (chronic kidney disease), stage IV ([name])    Clostridium difficile colitis    Diabetes mellitus without complication ([name])    GERD (gastroesophageal reflux disease)    HTN (hypertension)    Hyperlipidemia      SURGICAL HISTORY   Past Surgical History:  Procedure Laterality Date   A/V FISTULAGRAM Left 04/20/2018   Procedure: A/V FISTULAGRAM;  Surgeon: Algernon Huxley, MD;  Location: [facility];  Service: Cardiovascular;  Laterality: Left;   A/V FISTULAGRAM Left 11/25/2018   Procedure: A/V FISTULAGRAM;  Surgeon: Algernon Huxley, MD;  Location: [facility];  Service: Cardiovascular;  Laterality: Left;   A/V FISTULAGRAM Left 01/11/2019   Procedure: A/V FISTULAGRAM;  Surgeon: Algernon Huxley, MD;  Location: [facility];  Service: Cardiovascular;  Laterality: Left;   AV FISTULA PLACEMENT Left 06/13/2017   Procedure: ARTERIOVENOUS (AV) FISTULA CREATION ( RADIAL CEPHALIC );  Surgeon: Katha Cabal, MD;  Location: [facility];  Service: Vascular;  Laterality: Left;   CATARACT EXTRACTION     one eye not sure which   DIALYSIS/PERMA CATHETER INSERTION N/A 04/03/2017   Procedure: Dialysis/Perma Catheter Insertion;  Surgeon: Algernon Huxley, MD;  Location: [facility];  Service: Cardiovascular;  Laterality: N/A;   DIALYSIS/PERMA CATHETER INSERTION N/A 08/06/2017   Procedure: DIALYSIS/PERMA  CATHETER INSERTION;  Surgeon: Algernon Huxley, MD;  Location: [facility];  Service: Cardiovascular;  Laterality: N/A;   DIALYSIS/PERMA CATHETER REMOVAL N/A 08/04/2017   Procedure: DIALYSIS/PERMA CATHETER REMOVAL;  Surgeon: Algernon Huxley, MD;  Location: [facility];  Service: Cardiovascular;  Laterality: N/A;   DIALYSIS/PERMA CATHETER REMOVAL N/A 12/31/2017   Procedure: DIALYSIS/PERMA CATHETER REMOVAL;  Surgeon: Algernon Huxley, MD;  Location: [facility];  Service: Cardiovascular;  Laterality: N/A;   hernia repair     IR THORACENTESIS ASP PLEURAL SPACE W/IMG GUIDE  01/21/2022   SACROPLASTY N/A 09/30/2018   Procedure: SACROPLASTY;  Surgeon: Hessie Knows, MD;  Location: [facility];  Service: Orthopedics;  Laterality: N/A;   UPPER EXTREMITY ANGIOGRAPHY Left 09/05/2017   Procedure: Upper Extremity Angiography;  Surgeon: Katha Cabal, MD;  Location: [facility];  Service: Cardiovascular;  Laterality: Left;     FAMILY HISTORY   Family History  Problem Relation Age of Onset   Hypertension Mother    Diabetes Neg Hx      SOCIAL HISTORY   Social History   Tobacco Use   Smoking status: Never   Smokeless tobacco: Never  Vaping Use   Vaping Use: Never used  Substance Use Topics   Alcohol use: No    Comment: Drank in the past, but has stopped for several years.    Drug use: No     MEDICATIONS    Home Medication:  Current Medication:  Current Facility-Administered Medications:    0.9 %  sodium chloride infusion (Manually program via Guardrails IV Fluids), , Intravenous, Once, Hall, Carole N, DO   0.9 %  sodium chloride infusion, 250 mL, Intravenous, PRN, Agbata, Tochukwu, MD   0.9 %  sodium chloride infusion, 100 mL, Intravenous, PRN, Breeze, Shantelle, NP   0.9 %  sodium chloride infusion, 100 mL, Intravenous, PRN, Gwyneth Revels, Benancio Deeds, NP   acetaminophen (TYLENOL) tablet 650 mg, 650 mg, Oral, Q6H PRN, Agbata, Tochukwu, MD, 650 mg at 01/21/22 2155    allopurinol (ZYLOPRIM) tablet 50 mg, 50 mg, Oral, Daily, Agbata, Tochukwu, MD, 50 mg at 01/22/22 0917   alteplase (CATHFLO ACTIVASE) injection 2 mg, 2 mg, Intracatheter, Once PRN, Colon Flattery, NP   atorvastatin (LIPITOR) tablet 20 mg, 20 mg, Oral, Daily, Agbata, Tochukwu, MD, 20 mg at 01/22/22 0917   Chlorhexidine Gluconate Cloth 2 % PADS 6 each, 6 each, Topical, Q0600, Colon Flattery, NP, 6 each at 01/22/22 0549   cholecalciferol (VITAMIN D) tablet 1,000 Units, 1,000 Units, Oral, Daily, Agbata, Tochukwu, MD, 1,000 Units at 01/22/22 0917   epoetin alfa (EPOGEN) injection 10,000 Units, 10,000 Units, Intravenous, Q T,Th,Sa-HD, Breeze, Shantelle, NP   famotidine (PEPCID) tablet 20 mg, 20 mg, Oral, Daily, Agbata, Tochukwu, MD, 20 mg at 01/22/22 0917   ferrous sulfate tablet 325 mg, 325 mg, Oral, BID WC, Agbata, Tochukwu, MD, 325 mg at 01/22/22 0918   guaiFENesin (ROBITUSSIN) 100 MG/5ML liquid 10 mL, 10 mL, Oral, Q4H PRN, Agbata, Tochukwu, MD   heparin injection 1,000 Units, 1,000 Units, Dialysis, PRN, Breeze, Shantelle, NP   lidocaine (PF) (XYLOCAINE) 1 % injection 5 mL, 5 mL, Intradermal, PRN, Breeze, Shantelle, NP   lidocaine-prilocaine (EMLA) cream 1 application, 1 application, Topical, PRN, Breeze, Shantelle, NP   ondansetron (ZOFRAN) tablet 4 mg, 4 mg, Oral, Q6H PRN **OR** ondansetron (ZOFRAN) injection 4 mg, 4 mg, Intravenous, Q6H PRN, Agbata, Tochukwu, MD   pentafluoroprop-tetrafluoroeth (GEBAUERS) aerosol 1 application, 1 application, Topical, PRN, Breeze, Shantelle, NP   piperacillin-tazobactam (ZOSYN) IVPB 2.25 g, 2.25 g, Intravenous, Q8H, Darnelle Bos, RPH, Last Rate: 100 mL/hr at 01/22/22 0609, Infusion Verify at 01/22/22 0609   sodium chloride flush (NS) 0.9 % injection 3 mL, 3 mL, Intravenous, Q12H, Agbata, Tochukwu, MD, 3 mL at 01/22/22 0919   sodium chloride flush (NS) 0.9 % injection 3 mL, 3 mL, Intravenous, PRN, Agbata, Tochukwu, MD   tamsulosin (FLOMAX) capsule 0.4 mg, 0.4  mg, Oral, QPC supper, Agbata, Tochukwu, MD, 0.4 mg at 01/21/22 1834   vancomycin (VANCOCIN) IVPB 1000 mg/200 mL premix, 1,000 mg, Intravenous, Q T,Th,Sa-HD, Darnelle Bos, [place]    ALLERGIES   Patient has no known allergies.     REVIEW OF SYSTEMS    Review of Systems:  Gen:  Denies  fever, sweats, chills weigh loss  HEENT: Denies blurred vision, double vision, ear pain, eye pain, hearing loss, nose bleeds, sore throat Cardiac:  No dizziness, chest pain or heaviness, chest tightness,edema Resp:   Denies cough or sputum porduction, shortness of breath,wheezing, hemoptysis,  Gi: Denies swallowing difficulty, stomach pain, nausea or vomiting, diarrhea, constipation, bowel incontinence Gu:  Denies bladder incontinence, burning urine Ext:   Denies Joint pain, stiffness or swelling Skin: Denies  skin rash, easy bruising or bleeding or hives Endoc:  Denies polyuria, polydipsia , polyphagia or weight change Psych:   Denies depression, insomnia or hallucinations   Other:  All other systems negative   VS: BP (!) 123/23  Pulse 71    Temp 98.2 F (36.8 C) (Oral)    Resp (!) 21    Ht 5\' 5"  (1.651 m)    Wt 78.1 kg    SpO2 98%    BMI 28.65 kg/m      PHYSICAL EXAM    GENERAL:NAD, no fevers, chills, no weakness no fatigue HEAD: Normocephalic, atraumatic.  EYES: Pupils equal, round, reactive to light. Extraocular muscles intact. No scleral icterus.  MOUTH: Moist mucosal membrane. Dentition intact. No abscess noted.  EAR, NOSE, THROAT: Clear without exudates. No external lesions.  NECK: Supple. No thyromegaly. No nodules. No JVD.  PULMONARY: Diffuse coarse rhonchi right sided +wheezes CARDIOVASCULAR: S1 and S2. Regular rate and rhythm. No murmurs, rubs, or gallops. No edema. Pedal pulses 2+ bilaterally.  GASTROINTESTINAL: Soft, nontender, nondistended. No masses. Positive bowel sounds. No hepatosplenomegaly.  MUSCULOSKELETAL: No swelling, clubbing, or edema. Range of motion full in all  extremities.  NEUROLOGIC: Cranial nerves II through XII are intact. No gross focal neurological deficits. Sensation intact. Reflexes intact.  SKIN: No ulceration, lesions, rashes, or cyanosis. Skin warm and dry. Turgor intact.  PSYCHIATRIC: Mood, affect within normal limits. The patient is awake, alert and oriented x 3. Insight, judgment intact.       IMAGING    CT ABDOMEN PELVIS WO CONTRAST  Result Date: 01/21/2022 CLINICAL DATA:  Left lower quadrant abdominal pain with difficulty urinating for the past 3 weeks. Diarrhea. History of end-stage renal disease on hemodialysis. EXAM: CT ABDOMEN AND PELVIS WITHOUT CONTRAST TECHNIQUE: Multidetector CT imaging of the abdomen and pelvis was performed following the standard protocol without IV contrast. RADIATION DOSE REDUCTION: This exam was performed according to the departmental dose-optimization program which includes automated exposure control, adjustment of the mA and/or kV according to patient size and/or use of iterative reconstruction technique. COMPARISON:  Abdominopelvic CT 06/15/2021. Chest radiographs earlier today. FINDINGS: Lower chest: As seen on earlier radiographs, there is new moderate-sized laterally loculated right pleural effusion. There are some air bubbles posteriorly in the right pleural space (image 16/2). There is compressive atelectasis or consolidation of the right lower lobe. Streaky left lower lobe pulmonary opacities are similar to the previous study. No evidence of pneumothorax. Atherosclerosis of the aorta and coronary arteries noted. Hepatobiliary: Nodular contours of the liver again noted suspicious for early cirrhosis. No focal lesion identified without contrast. No evidence of gallstones, gallbladder wall thickening or biliary dilatation. Pancreas: Unremarkable. No pancreatic ductal dilatation or surrounding inflammatory changes. Spleen: Normal in size without focal abnormality. Adrenals/Urinary Tract: Both adrenal glands  appear normal. Bilateral renal cortical thinning with nonspecific perinephric soft tissue stranding again noted bilaterally. Small low-density renal lesions bilaterally are stable, likely cysts. No evidence of urinary tract calculus or hydronephrosis. The bladder appears grossly unremarkable. Stomach/Bowel: No enteric contrast administered. The stomach is poorly distended with possible generalized wall thickening, similar to the previous study. No evidence of bowel wall thickening, distention or surrounding inflammation. Vascular/Lymphatic: There are several mildly enlarged lymph nodes in the pelvis, including right iliac nodes measuring 8 mm on image 75/2 and 12 mm on image 81/2. There is a 10 mm left external iliac node on image 80/2. Aortic and branch vessel atherosclerosis without acute vascular findings on noncontrast imaging. Reproductive: The prostate gland is enlarged. There are multiple new areas of ill-defined low-density within the gland, measuring up to 3.1 x 2.6 cm on image 92/2. No definite surrounding inflammatory changes to suggest that this is inflammatory. Mild perirectal soft tissue stranding is  similar to the previous study. Other: Mild soft tissue stranding throughout the mesenteric fat without significant ascites. No free air. Musculoskeletal: No acute or significant osseous findings. Previous bilateral iliac bone osteoplasty. IMPRESSION: 1. New ill-defined low density throughout the prostate gland which could reflect prostate cancer or prostatitis/abscess formation. Correlate with serum PSA levels. Recommend urology consultation. 2. Several mildly enlarged pelvic lymph nodes suspicious for metastatic disease. 3. No evidence of hydronephrosis or focal bladder abnormality. Stable chronic renal cortical thinning consistent with chronic renal failure. 4. As seen on earlier chest radiographs, laterally loculated right pleural effusion with air in the pleural space which may indicate empyema.  Correlate clinically. Compressive right lower lobe atelectasis or consolidation. 5.  Aortic Atherosclerosis (ICD10-I70.0). Electronically Signed   By: Richardean Sale M.D.   On: 01/21/2022 11:59   DG Chest 2 View  Result Date: 01/21/2022 CLINICAL DATA:  81 year old male with history of chest pain and cough. EXAM: CHEST - 2 VIEW COMPARISON:  Chest x-ray 12/09/2021. FINDINGS: Diffuse interstitial prominence and widespread peribronchial cuffing again noted. New moderate to large partially loculated right pleural effusion with increasing atelectasis and/or consolidation throughout the right mid to lower lung. Probable areas of subsegmental atelectasis at the left lung base. No definite left pleural effusion. No pneumothorax. No evidence of pulmonary edema. Heart size is mildly enlarged. Upper mediastinal contours are within normal limits. IMPRESSION: 1. New moderate to large right pleural effusion which is likely partially loculated with extensive atelectasis and/or consolidation in the right mid to lower lung. 2. Probable subsegmental atelectasis in the left lung base. 3. Diffuse interstitial prominence and peribronchial cuffing, concerning for an acute bronchitis. 4. Mild cardiomegaly. 5. Aortic atherosclerosis. Electronically Signed   By: Vinnie Langton M.D.   On: 01/21/2022 10:48   DG Lumbar Spine 2-3 Views  Result Date: 01/21/2022 CLINICAL DATA:  Back pain EXAM: LUMBAR SPINE - 2-3 VIEW COMPARISON:  10/11/2018 FINDINGS: No recent fracture is seen. There is previous vertebroplasty in the iliac bones on both sides. Degenerative changes are noted with bony spurs and facet hypertrophy. There is no significant disc space narrowing. Arterial calcifications are seen in the aorta and its major branches. IMPRESSION: No recent fracture is seen. Lumbar spondylosis. No significant interval changes are noted. Electronically Signed   By: Elmer Picker M.D.   On: 01/21/2022 16:48   DG Chest Port 1 View  Result  Date: 01/22/2022 CLINICAL DATA:  Hemothorax, diabetes mellitus, hypertension EXAM: PORTABLE CHEST 1 VIEW COMPARISON:  Portable exam is 1019 hours compared to 01/21/2022 FINDINGS: Enlargement of cardiac silhouette with vascular congestion. Atherosclerotic calcification aorta. Large loculated fluid collection at the lateral RIGHT chest, could represent hemothorax or loculated pleural effusion. Significant atelectasis of mid to lower RIGHT lung. Scattered infiltrates in both lungs. No pneumothorax. Rib fractures seen on prior CT exam are not well visualized radiographically. IMPRESSION: Persistent loculated collection at lateral mid to lower RIGHT hemithorax which could represent hemothorax or loculated effusion. Significant atelectasis of the adjacent mid to lower RIGHT lung with scattered infiltrates in remaining lungs bilaterally. No pneumothorax. Aortic Atherosclerosis (ICD10-I70.0). Electronically Signed   By: Lavonia Dana M.D.   On: 01/22/2022 10:27   DG Chest Port 1 View  Result Date: 01/21/2022 CLINICAL DATA:  Back pain. Kidney disease with current UTI. Post thoracentesis. EXAM: PORTABLE CHEST 1 VIEW COMPARISON:  AP chest 01/21/2022, CT chest 02/18/2017, AP chest 12/09/2021 FINDINGS: Cardiac silhouette appears mildly enlarged. Mediastinal contours are within normal limits. Mild calcification within aortic arch. There  is not a definite change in the increased density apparent represent a taller than wide loculated pleural effusion of the inferolateral right hemithorax. Heterogeneous airspace opacification within the right mid and lower lung. No definite left pleural effusion. No pneumothorax. No definite interstitial pulmonary edema. Moderate multilevel degenerative disc changes of the thoracic spine. IMPRESSION: Persistent moderate-to-large loculated right pleural effusion involving the inferolateral right hemithorax. Electronically Signed   By: Yvonne Kendall M.D.   On: 01/21/2022 16:49   IR THORACENTESIS  ASP PLEURAL SPACE W/IMG GUIDE  Result Date: 01/21/2022 INDICATION: Patient with history of ESRD who presented to the ED with abdominal pain and difficulty urinating found to have large right loculated pleural effusion. Patient noted to have right sided rib fractures on CT abdomen/pelvis today as well. Request to IR for diagnostic right thoracentesis. EXAM: ULTRASOUND GUIDED RIGHT THORACENTESIS MEDICATIONS: 6 mL 1% lidocaine COMPLICATIONS: None immediate. PROCEDURE: An ultrasound guided thoracentesis was thoroughly discussed with the patient and questions answered. The benefits, risks, alternatives and complications were also discussed. The patient understands and wishes to proceed with the procedure. Written consent was obtained. Ultrasound was performed to localize and mark an adequate pocket of fluid in the right chest. The area was then prepped and draped in the normal sterile fashion. 1% Lidocaine was used for local anesthesia. Under ultrasound guidance a 6 Fr Safe-T-Centesis catheter was introduced. Thoracentesis was performed. The catheter was removed and a dressing applied. FINDINGS: A total of approximately 10 mL of serosanguineous fluid was removed. Samples were sent to the laboratory as requested by the clinical team. IMPRESSION: Successful ultrasound guided right thoracentesis yielding 10 mL of pleural fluid. Appearance of the right pleural fluid on ultrasound is compatible with a posttraumatic hemothorax, which would be compatible with the patient's posterior rib fractures identified on recent CT. This would not be amenable to further image guided drainage. Consider surgical consultation for further management. Read by Candiss Norse, PA-C Electronically Signed   By: Albin Felling M.D.   On: 01/21/2022 16:19      ASSESSMENT/PLAN   Right pleural effusion, rib fracture, Tiny amount of bloody effusion removed. Working dx is loculated post traumatic  For pigtail catheter placement per IR.  Per  response, may need a larger ( per diameter) tube. Still send the fluid for the pleural fluid test ordered.      Thank you for allowing me to participate in the care of this patient.   Patient/Family are satisfied with care plan and all questions have been answered.  This document was prepared using Dragon voice recognition software and may include unintentional dictation errors.     Wallene Huh, M.D.  Division of [facility]

## 2022-01-22 NOTE — Progress Notes (Signed)
Urology Consult Follow Up  Subjective: Patient is seen and examined with interpreter services.  He states he is no longer having any lower abdominal pain.  He continues to ask if he can eat during my interview as he is n.p.o. for a thoracentesis this morning.  VSS afebrile  WBC count down to 8.1 from 12.1 yesterday.  Serum creatinine at baseline, 7.52.  Foley in place draining foul smelling, brown urine.  Blood and urine cultures are pending.     Anti-infectives: Anti-infectives (From admission, onward)    Start     Dose/Rate Route Frequency Ordered Stop   01/22/22 1200  vancomycin (VANCOCIN) IVPB 1000 mg/200 mL premix        1,000 mg 200 mL/hr over 60 Minutes Intravenous Every T-Th-Sa (Hemodialysis) 01/21/22 1638     01/21/22 1700  piperacillin-tazobactam (ZOSYN) IVPB 2.25 g        2.25 g 100 mL/hr over 30 Minutes Intravenous Every 8 hours 01/21/22 1638     01/21/22 1330  vancomycin (VANCOCIN) IVPB 1000 mg/200 mL premix       See Hyperspace for full Linked Orders Report.   1,000 mg 200 mL/hr over 60 Minutes Intravenous  Once 01/21/22 1224 01/21/22 1703   01/21/22 1230  ceFEPIme (MAXIPIME) 2 g in sodium chloride 0.9 % 100 mL IVPB        2 g 200 mL/hr over 30 Minutes Intravenous  Once 01/21/22 1224 01/21/22 1431   01/21/22 1230  vancomycin (VANCOCIN) IVPB 1000 mg/200 mL premix       See Hyperspace for full Linked Orders Report.   1,000 mg 200 mL/hr over 60 Minutes Intravenous  Once 01/21/22 1224 01/21/22 1530       Current Facility-Administered Medications  Medication Dose Route Frequency Provider Last Rate Last Admin   0.9 %  sodium chloride infusion  250 mL Intravenous PRN Agbata, Tochukwu, MD       0.9 %  sodium chloride infusion  100 mL Intravenous PRN Breeze, Shantelle, NP       0.9 %  sodium chloride infusion  100 mL Intravenous PRN Colon Flattery, NP       acetaminophen (TYLENOL) tablet 650 mg  650 mg Oral Q6H PRN Agbata, Tochukwu, MD   650 mg at 01/21/22 2155    allopurinol (ZYLOPRIM) tablet 50 mg  50 mg Oral Daily Agbata, Tochukwu, MD   50 mg at 01/22/22 0917   alteplase (CATHFLO ACTIVASE) injection 2 mg  2 mg Intracatheter Once PRN Colon Flattery, NP       atorvastatin (LIPITOR) tablet 20 mg  20 mg Oral Daily Agbata, Tochukwu, MD   20 mg at 01/22/22 0917   Chlorhexidine Gluconate Cloth 2 % PADS 6 each  6 each Topical Q0600 Colon Flattery, NP   6 each at 01/22/22 0549   cholecalciferol (VITAMIN D) tablet 1,000 Units  1,000 Units Oral Daily Agbata, Tochukwu, MD   1,000 Units at 01/22/22 0917   famotidine (PEPCID) tablet 20 mg  20 mg Oral Daily Agbata, Tochukwu, MD   20 mg at 01/22/22 0917   ferrous sulfate tablet 325 mg  325 mg Oral BID WC Agbata, Tochukwu, MD   325 mg at 01/22/22 0918   guaiFENesin (ROBITUSSIN) 100 MG/5ML liquid 10 mL  10 mL Oral Q4H PRN Agbata, Tochukwu, MD       heparin injection 1,000 Units  1,000 Units Dialysis PRN Colon Flattery, NP       lidocaine (PF) (XYLOCAINE) 1 % injection 5 mL  5 mL Intradermal PRN Colon Flattery, NP       lidocaine-prilocaine (EMLA) cream 1 application  1 application Topical PRN Colon Flattery, NP       ondansetron (ZOFRAN) tablet 4 mg  4 mg Oral Q6H PRN Agbata, Tochukwu, MD       Or   ondansetron (ZOFRAN) injection 4 mg  4 mg Intravenous Q6H PRN Agbata, Tochukwu, MD       pentafluoroprop-tetrafluoroeth (GEBAUERS) aerosol 1 application  1 application Topical PRN Colon Flattery, NP       piperacillin-tazobactam (ZOSYN) IVPB 2.25 g  2.25 g Intravenous Q8H Darnelle Bos, RPH 100 mL/hr at 01/22/22 9449 Infusion Verify at 01/22/22 6759   sodium chloride flush (NS) 0.9 % injection 3 mL  3 mL Intravenous Q12H Agbata, Tochukwu, MD   3 mL at 01/22/22 0919   sodium chloride flush (NS) 0.9 % injection 3 mL  3 mL Intravenous PRN Agbata, Tochukwu, MD       tamsulosin (FLOMAX) capsule 0.4 mg  0.4 mg Oral QPC supper Agbata, Tochukwu, MD   0.4 mg at 01/21/22 1834   vancomycin (VANCOCIN) IVPB 1000 mg/200  mL premix  1,000 mg Intravenous Q T,Th,Sa-HD Darnelle Bos, RPH         Objective: Vital signs in last 24 hours: Temp:  [97.6 F (36.4 C)-98.7 F (37.1 C)] 97.6 F (36.4 C) (02/07 0808) Pulse Rate:  [63-78] 78 (02/07 0808) Resp:  [16-21] 16 (02/07 0808) BP: (108-130)/(40-60) 119/44 (02/07 0808) SpO2:  [94 %-99 %] 96 % (02/07 0808)  Intake/Output from previous day: 02/06 0701 - 02/07 0700 In: 83.2 [IV Piggyback:83.2] Out: -  Intake/Output this shift: No intake/output data recorded.   Physical Exam Constitutional:  Well nourished. Alert and oriented, No acute distress. HEENT: [place] AT, moist mucus membranes.  Trachea midline Cardiovascular: No clubbing, cyanosis, or edema. Respiratory: Normal respiratory effort, no increased work of breathing. GU: No CVA tenderness.  No bladder fullness or masses.  Patient with uncircumcised phallus. Foreskin over the head of the glans, Foley catheter in place draining brown, turbid urine.  Neurologic: Grossly intact, no focal deficits, moving all 4 extremities. Psychiatric: Normal mood and affect.   Lab Results:  Recent Labs    01/21/22 1025 01/22/22 0424  WBC 12.1* 8.1  HGB 7.7* 7.2*  HCT 23.8* 21.7*  PLT 226 216   BMET Recent Labs    01/21/22 1025 01/22/22 0424  NA 130* 130*  K 3.9 4.3  CL 96* 97*  CO2 24 23  GLUCOSE 121* 103*  BUN 69* 74*  CREATININE 6.52* 7.52*  CALCIUM 7.9* 7.8*   PT/INR Recent Labs    01/21/22 1312  LABPROT 15.0  INR 1.2   ABG No results for input(s): PHART, HCO3 in the last 72 hours.  Invalid input(s): PCO2, PO2  Studies/Results: CT ABDOMEN PELVIS WO CONTRAST  Result Date: 01/21/2022 CLINICAL DATA:  Left lower quadrant abdominal pain with difficulty urinating for the past 3 weeks. Diarrhea. History of end-stage renal disease on hemodialysis. EXAM: CT ABDOMEN AND PELVIS WITHOUT CONTRAST TECHNIQUE: Multidetector CT imaging of the abdomen and pelvis was performed following the standard protocol  without IV contrast. RADIATION DOSE REDUCTION: This exam was performed according to the departmental dose-optimization program which includes automated exposure control, adjustment of the mA and/or kV according to patient size and/or use of iterative reconstruction technique. COMPARISON:  Abdominopelvic CT 06/15/2021. Chest radiographs earlier today. FINDINGS: Lower chest: As seen on earlier radiographs, there is new moderate-sized  laterally loculated right pleural effusion. There are some air bubbles posteriorly in the right pleural space (image 16/2). There is compressive atelectasis or consolidation of the right lower lobe. Streaky left lower lobe pulmonary opacities are similar to the previous study. No evidence of pneumothorax. Atherosclerosis of the aorta and coronary arteries noted. Hepatobiliary: Nodular contours of the liver again noted suspicious for early cirrhosis. No focal lesion identified without contrast. No evidence of gallstones, gallbladder wall thickening or biliary dilatation. Pancreas: Unremarkable. No pancreatic ductal dilatation or surrounding inflammatory changes. Spleen: Normal in size without focal abnormality. Adrenals/Urinary Tract: Both adrenal glands appear normal. Bilateral renal cortical thinning with nonspecific perinephric soft tissue stranding again noted bilaterally. Small low-density renal lesions bilaterally are stable, likely cysts. No evidence of urinary tract calculus or hydronephrosis. The bladder appears grossly unremarkable. Stomach/Bowel: No enteric contrast administered. The stomach is poorly distended with possible generalized wall thickening, similar to the previous study. No evidence of bowel wall thickening, distention or surrounding inflammation. Vascular/Lymphatic: There are several mildly enlarged lymph nodes in the pelvis, including right iliac nodes measuring 8 mm on image 75/2 and 12 mm on image 81/2. There is a 10 mm left external iliac node on image 80/2.  Aortic and branch vessel atherosclerosis without acute vascular findings on noncontrast imaging. Reproductive: The prostate gland is enlarged. There are multiple new areas of ill-defined low-density within the gland, measuring up to 3.1 x 2.6 cm on image 92/2. No definite surrounding inflammatory changes to suggest that this is inflammatory. Mild perirectal soft tissue stranding is similar to the previous study. Other: Mild soft tissue stranding throughout the mesenteric fat without significant ascites. No free air. Musculoskeletal: No acute or significant osseous findings. Previous bilateral iliac bone osteoplasty. IMPRESSION: 1. New ill-defined low density throughout the prostate gland which could reflect prostate cancer or prostatitis/abscess formation. Correlate with serum PSA levels. Recommend urology consultation. 2. Several mildly enlarged pelvic lymph nodes suspicious for metastatic disease. 3. No evidence of hydronephrosis or focal bladder abnormality. Stable chronic renal cortical thinning consistent with chronic renal failure. 4. As seen on earlier chest radiographs, laterally loculated right pleural effusion with air in the pleural space which may indicate empyema. Correlate clinically. Compressive right lower lobe atelectasis or consolidation. 5.  Aortic Atherosclerosis (ICD10-I70.0). Electronically Signed   By: Richardean Sale M.D.   On: 01/21/2022 11:59   DG Chest 2 View  Result Date: 01/21/2022 CLINICAL DATA:  81 year old male with history of chest pain and cough. EXAM: CHEST - 2 VIEW COMPARISON:  Chest x-ray 12/09/2021. FINDINGS: Diffuse interstitial prominence and widespread peribronchial cuffing again noted. New moderate to large partially loculated right pleural effusion with increasing atelectasis and/or consolidation throughout the right mid to lower lung. Probable areas of subsegmental atelectasis at the left lung base. No definite left pleural effusion. No pneumothorax. No evidence of  pulmonary edema. Heart size is mildly enlarged. Upper mediastinal contours are within normal limits. IMPRESSION: 1. New moderate to large right pleural effusion which is likely partially loculated with extensive atelectasis and/or consolidation in the right mid to lower lung. 2. Probable subsegmental atelectasis in the left lung base. 3. Diffuse interstitial prominence and peribronchial cuffing, concerning for an acute bronchitis. 4. Mild cardiomegaly. 5. Aortic atherosclerosis. Electronically Signed   By: Vinnie Langton M.D.   On: 01/21/2022 10:48   DG Lumbar Spine 2-3 Views  Result Date: 01/21/2022 CLINICAL DATA:  Back pain EXAM: LUMBAR SPINE - 2-3 VIEW COMPARISON:  10/11/2018 FINDINGS: No recent fracture is seen.  There is previous vertebroplasty in the iliac bones on both sides. Degenerative changes are noted with bony spurs and facet hypertrophy. There is no significant disc space narrowing. Arterial calcifications are seen in the aorta and its major branches. IMPRESSION: No recent fracture is seen. Lumbar spondylosis. No significant interval changes are noted. Electronically Signed   By: Elmer Picker M.D.   On: 01/21/2022 16:48   DG Chest Port 1 View  Result Date: 01/21/2022 CLINICAL DATA:  Back pain. Kidney disease with current UTI. Post thoracentesis. EXAM: PORTABLE CHEST 1 VIEW COMPARISON:  AP chest 01/21/2022, CT chest 02/18/2017, AP chest 12/09/2021 FINDINGS: Cardiac silhouette appears mildly enlarged. Mediastinal contours are within normal limits. Mild calcification within aortic arch. There is not a definite change in the increased density apparent represent a taller than wide loculated pleural effusion of the inferolateral right hemithorax. Heterogeneous airspace opacification within the right mid and lower lung. No definite left pleural effusion. No pneumothorax. No definite interstitial pulmonary edema. Moderate multilevel degenerative disc changes of the thoracic spine. IMPRESSION:  Persistent moderate-to-large loculated right pleural effusion involving the inferolateral right hemithorax. Electronically Signed   By: Yvonne Kendall M.D.   On: 01/21/2022 16:49   IR THORACENTESIS ASP PLEURAL SPACE W/IMG GUIDE  Result Date: 01/21/2022 INDICATION: Patient with history of ESRD who presented to the ED with abdominal pain and difficulty urinating found to have large right loculated pleural effusion. Patient noted to have right sided rib fractures on CT abdomen/pelvis today as well. Request to IR for diagnostic right thoracentesis. EXAM: ULTRASOUND GUIDED RIGHT THORACENTESIS MEDICATIONS: 6 mL 1% lidocaine COMPLICATIONS: None immediate. PROCEDURE: An ultrasound guided thoracentesis was thoroughly discussed with the patient and questions answered. The benefits, risks, alternatives and complications were also discussed. The patient understands and wishes to proceed with the procedure. Written consent was obtained. Ultrasound was performed to localize and mark an adequate pocket of fluid in the right chest. The area was then prepped and draped in the normal sterile fashion. 1% Lidocaine was used for local anesthesia. Under ultrasound guidance a 6 Fr Safe-T-Centesis catheter was introduced. Thoracentesis was performed. The catheter was removed and a dressing applied. FINDINGS: A total of approximately 10 mL of serosanguineous fluid was removed. Samples were sent to the laboratory as requested by the clinical team. IMPRESSION: Successful ultrasound guided right thoracentesis yielding 10 mL of pleural fluid. Appearance of the right pleural fluid on ultrasound is compatible with a posttraumatic hemothorax, which would be compatible with the patient's posterior rib fractures identified on recent CT. This would not be amenable to further image guided drainage. Consider surgical consultation for further management. Read by Candiss Norse, PA-C Electronically Signed   By: Albin Felling M.D.   On: 01/21/2022  16:19     Assessment: 81 year old male with ESRD on dialysis who presented to the emergency room yesterday complaining of pelvic pain and decreased urinary output.  Foley catheter was placed for an estimated volume of 200 mL and UA was nitrate positive with WBCs/RBCs significant for pyuria and bacteria.  The CT of the abdomen and pelvis without contrast showed a low-density area measuring approximately 3 cm in the right anterior gland concerning for prostatitis versus prostate abscess formation.  Foley in place continuing to drain brown turbid urine, but patient's pain has abated.  Urine and blood cultures are pending.  Currently on broad-spectrum IV antibiotics awaiting culture results.  Plan: -Continue to follow cultures -Continue broad-spectrum antibiotics -continue to trend CBC  -If he should deteriorate during  this admission, we will need to repeat imaging -If he continues to respond well to the antibiotics will obtain outpatient follow-up imaging   LOS: 1 day    [facility] 01/22/2022

## 2022-01-22 NOTE — Progress Notes (Signed)
Patient's daughter Ellin Saba Truddie Hidden are made aware of pt is going for a chest tube, low H&H and will receive 1 unit of blood. They explain pt as well.

## 2022-01-22 NOTE — Progress Notes (Signed)
[facility]  ROUNDING NOTE   Subjective:   [name] was admitted to [facility] on 01/21/2022 for Urinary retention [R33.9] Back pain [M54.9] UTI (urinary tract infection) [N39.0] Pleural effusion [J90] Pleural effusion on right [J90] Acute cystitis with hematuria [N30.01] Chest pain, unspecified type [R07.9] Sepsis, due to unspecified organism, unspecified whether acute organ dysfunction present [facility]) [A41.9] Acute cough [R05.1]  Last hemodialysis treatment was Saturday, February 4. Left at 72.5kg - below his dry weight.  History is difficult to get even with [place] Interpreter.   Patient resting in bed, daughter at bedside Complains of cough, nonproductive Lower abdominal pain remains   Objective:  Vital signs in last 24 hours:  Temp:  [97.6 F (36.4 C)-98.7 F (37.1 C)] 98.2 F (36.8 C) (02/07 1054) Pulse Rate:  [63-78] 70 (02/07 1007) Resp:  [16-22] 22 (02/07 1007) BP: (108-130)/(41-60) 116/46 (02/07 1007) SpO2:  [94 %-99 %] 98 % (02/07 1007) Weight:  [78.1 kg] 78.1 kg (02/07 1003)  Weight change:  Filed Weights   01/21/22 0938 01/22/22 1003  Weight: 90.7 kg 78.1 kg    Intake/Output: I/O last 3 completed shifts: In: 83.2 [IV Piggyback:83.2] Out: -    Intake/Output this shift:  No intake/output data recorded.  Physical Exam: General: NAD, laying on stretcher  Head: Normocephalic, atraumatic. Moist oral mucosal membranes  Eyes: Anicteric  Lungs:  Basilar rales, normal effort, cough  Heart: Regular rate and rhythm  Abdomen:  Suprapubic tenderness  Extremities:  No peripheral edema.  Neurologic: Nonfocal, moving all four extremities  Skin: No lesions  Access: Left AVF    Basic Metabolic Panel: Recent Labs  Lab 01/21/22 1025 01/22/22 0424  NA 130* 130*  K 3.9 4.3  CL 96* 97*  CO2 24 23  GLUCOSE 121* 103*  BUN 69* 74*  CREATININE 6.52* 7.52*  CALCIUM 7.9* 7.8*     Liver Function Tests: Recent Labs  Lab  01/21/22 1025  AST 52*  ALT 20  ALKPHOS 443*  BILITOT 1.6*  PROT 6.3*  ALBUMIN 1.9*    Recent Labs  Lab 01/21/22 1025  LIPASE 24    No results for input(s): AMMONIA in the last 168 hours.  CBC: Recent Labs  Lab 01/21/22 1025 01/22/22 0424 01/22/22 1029  WBC 12.1* 8.1  --   NEUTROABS 10.2*  --   --   HGB 7.7* 7.2* 7.2*  HCT 23.8* 21.7* 21.8*  MCV 97.9 94.8  --   PLT 226 216  --      Cardiac Enzymes: No results for input(s): CKTOTAL, CKMB, CKMBINDEX, TROPONINI in the last 168 hours.  BNP: Invalid input(s): POCBNP  CBG: Recent Labs  Lab 01/21/22 2126 01/21/22 2322 01/22/22 0420 01/22/22 0810  GLUCAP 129* 141* 100* 118*    Microbiology: Results for orders placed or performed during the hospital encounter of 01/21/22  Body fluid culture w Gram Stain     Status: None (Preliminary result)   Collection Time: 01/21/22  4:02 PM   Specimen: Chest; Body Fluid  Result Value Ref Range Status   Specimen Description   Final    CHEST FLUID Performed at [facility], [place] [address], [place], [place] [address]    Special Requests   Final    NONE Performed at [facility], [place],  [address]    Gram Stain NO WBC SEEN NO ORGANISMS SEEN   Final   Culture   Final    TOO YOUNG TO READ Performed  at [facility], [place] [address], [place], [place] [address]    Report Status PENDING  Incomplete    Coagulation Studies: Recent Labs    01/21/22 1312  LABPROT 15.0  INR 1.2     Urinalysis: Recent Labs    01/21/22 1025  COLORURINE BROWN*  LABSPEC 1.020  PHURINE 7.0  GLUCOSEU NEGATIVE  HGBUR MODERATE*  BILIRUBINUR SMALL*  KETONESUR NEGATIVE  PROTEINUR >300*  NITRITE POSITIVE*  LEUKOCYTESUR LARGE*       Imaging: CT ABDOMEN PELVIS WO CONTRAST  Result Date: 01/21/2022 CLINICAL DATA:  Left lower quadrant abdominal pain with difficulty urinating for the past 3 weeks. Diarrhea. History of end-stage renal disease  on hemodialysis. EXAM: CT ABDOMEN AND PELVIS WITHOUT CONTRAST TECHNIQUE: Multidetector CT imaging of the abdomen and pelvis was performed following the standard protocol without IV contrast. RADIATION DOSE REDUCTION: This exam was performed according to the departmental dose-optimization program which includes automated exposure control, adjustment of the mA and/or kV according to patient size and/or use of iterative reconstruction technique. COMPARISON:  Abdominopelvic CT 06/15/2021. Chest radiographs earlier today. FINDINGS: Lower chest: As seen on earlier radiographs, there is new moderate-sized laterally loculated right pleural effusion. There are some air bubbles posteriorly in the right pleural space (image 16/2). There is compressive atelectasis or consolidation of the right lower lobe. Streaky left lower lobe pulmonary opacities are similar to the previous study. No evidence of pneumothorax. Atherosclerosis of the aorta and coronary arteries noted. Hepatobiliary: Nodular contours of the liver again noted suspicious for early cirrhosis. No focal lesion identified without contrast. No evidence of gallstones, gallbladder wall thickening or biliary dilatation. Pancreas: Unremarkable. No pancreatic ductal dilatation or surrounding inflammatory changes. Spleen: Normal in size without focal abnormality. Adrenals/Urinary Tract: Both adrenal glands appear normal. Bilateral renal cortical thinning with nonspecific perinephric soft tissue stranding again noted bilaterally. Small low-density renal lesions bilaterally are stable, likely cysts. No evidence of urinary tract calculus or hydronephrosis. The bladder appears grossly unremarkable. Stomach/Bowel: No enteric contrast administered. The stomach is poorly distended with possible generalized wall thickening, similar to the previous study. No evidence of bowel wall thickening, distention or surrounding inflammation. Vascular/Lymphatic: There are several mildly  enlarged lymph nodes in the pelvis, including right iliac nodes measuring 8 mm on image 75/2 and 12 mm on image 81/2. There is a 10 mm left external iliac node on image 80/2. Aortic and branch vessel atherosclerosis without acute vascular findings on noncontrast imaging. Reproductive: The prostate gland is enlarged. There are multiple new areas of ill-defined low-density within the gland, measuring up to 3.1 x 2.6 cm on image 92/2. No definite surrounding inflammatory changes to suggest that this is inflammatory. Mild perirectal soft tissue stranding is similar to the previous study. Other: Mild soft tissue stranding throughout the mesenteric fat without significant ascites. No free air. Musculoskeletal: No acute or significant osseous findings. Previous bilateral iliac bone osteoplasty. IMPRESSION: 1. New ill-defined low density throughout the prostate gland which could reflect prostate cancer or prostatitis/abscess formation. Correlate with serum PSA levels. Recommend urology consultation. 2. Several mildly enlarged pelvic lymph nodes suspicious for metastatic disease. 3. No evidence of hydronephrosis or focal bladder abnormality. Stable chronic renal cortical thinning consistent with chronic renal failure. 4. As seen on earlier chest radiographs, laterally loculated right pleural effusion with air in the pleural space which may indicate empyema. Correlate clinically. Compressive right lower lobe atelectasis or consolidation. 5.  Aortic Atherosclerosis (ICD10-I70.0). Electronically Signed   By: Caryl Comes.D.  On: 01/21/2022 11:59   DG Chest 2 View  Result Date: 01/21/2022 CLINICAL DATA:  81 year old male with history of chest pain and cough. EXAM: CHEST - 2 VIEW COMPARISON:  Chest x-ray 12/09/2021. FINDINGS: Diffuse interstitial prominence and widespread peribronchial cuffing again noted. New moderate to large partially loculated right pleural effusion with increasing atelectasis and/or consolidation  throughout the right mid to lower lung. Probable areas of subsegmental atelectasis at the left lung base. No definite left pleural effusion. No pneumothorax. No evidence of pulmonary edema. Heart size is mildly enlarged. Upper mediastinal contours are within normal limits. IMPRESSION: 1. New moderate to large right pleural effusion which is likely partially loculated with extensive atelectasis and/or consolidation in the right mid to lower lung. 2. Probable subsegmental atelectasis in the left lung base. 3. Diffuse interstitial prominence and peribronchial cuffing, concerning for an acute bronchitis. 4. Mild cardiomegaly. 5. Aortic atherosclerosis. Electronically Signed   By: Vinnie Langton M.D.   On: 01/21/2022 10:48   DG Lumbar Spine 2-3 Views  Result Date: 01/21/2022 CLINICAL DATA:  Back pain EXAM: LUMBAR SPINE - 2-3 VIEW COMPARISON:  10/11/2018 FINDINGS: No recent fracture is seen. There is previous vertebroplasty in the iliac bones on both sides. Degenerative changes are noted with bony spurs and facet hypertrophy. There is no significant disc space narrowing. Arterial calcifications are seen in the aorta and its major branches. IMPRESSION: No recent fracture is seen. Lumbar spondylosis. No significant interval changes are noted. Electronically Signed   By: Elmer Picker M.D.   On: 01/21/2022 16:48   DG Chest Port 1 View  Result Date: 01/22/2022 CLINICAL DATA:  Hemothorax, diabetes mellitus, hypertension EXAM: PORTABLE CHEST 1 VIEW COMPARISON:  Portable exam is 1019 hours compared to 01/21/2022 FINDINGS: Enlargement of cardiac silhouette with vascular congestion. Atherosclerotic calcification aorta. Large loculated fluid collection at the lateral RIGHT chest, could represent hemothorax or loculated pleural effusion. Significant atelectasis of mid to lower RIGHT lung. Scattered infiltrates in both lungs. No pneumothorax. Rib fractures seen on prior CT exam are not well visualized radiographically.  IMPRESSION: Persistent loculated collection at lateral mid to lower RIGHT hemithorax which could represent hemothorax or loculated effusion. Significant atelectasis of the adjacent mid to lower RIGHT lung with scattered infiltrates in remaining lungs bilaterally. No pneumothorax. Aortic Atherosclerosis (ICD10-I70.0). Electronically Signed   By: Lavonia Dana M.D.   On: 01/22/2022 10:27   DG Chest Port 1 View  Result Date: 01/21/2022 CLINICAL DATA:  Back pain. Kidney disease with current UTI. Post thoracentesis. EXAM: PORTABLE CHEST 1 VIEW COMPARISON:  AP chest 01/21/2022, CT chest 02/18/2017, AP chest 12/09/2021 FINDINGS: Cardiac silhouette appears mildly enlarged. Mediastinal contours are within normal limits. Mild calcification within aortic arch. There is not a definite change in the increased density apparent represent a taller than wide loculated pleural effusion of the inferolateral right hemithorax. Heterogeneous airspace opacification within the right mid and lower lung. No definite left pleural effusion. No pneumothorax. No definite interstitial pulmonary edema. Moderate multilevel degenerative disc changes of the thoracic spine. IMPRESSION: Persistent moderate-to-large loculated right pleural effusion involving the inferolateral right hemithorax. Electronically Signed   By: Yvonne Kendall M.D.   On: 01/21/2022 16:49   IR THORACENTESIS ASP PLEURAL SPACE W/IMG GUIDE  Result Date: 01/21/2022 INDICATION: Patient with history of ESRD who presented to the ED with abdominal pain and difficulty urinating found to have large right loculated pleural effusion. Patient noted to have right sided rib fractures on CT abdomen/pelvis today as well. Request to IR  for diagnostic right thoracentesis. EXAM: ULTRASOUND GUIDED RIGHT THORACENTESIS MEDICATIONS: 6 mL 1% lidocaine COMPLICATIONS: None immediate. PROCEDURE: An ultrasound guided thoracentesis was thoroughly discussed with the patient and questions answered. The  benefits, risks, alternatives and complications were also discussed. The patient understands and wishes to proceed with the procedure. Written consent was obtained. Ultrasound was performed to localize and mark an adequate pocket of fluid in the right chest. The area was then prepped and draped in the normal sterile fashion. 1% Lidocaine was used for local anesthesia. Under ultrasound guidance a 6 Fr Safe-T-Centesis catheter was introduced. Thoracentesis was performed. The catheter was removed and a dressing applied. FINDINGS: A total of approximately 10 mL of serosanguineous fluid was removed. Samples were sent to the laboratory as requested by the clinical team. IMPRESSION: Successful ultrasound guided right thoracentesis yielding 10 mL of pleural fluid. Appearance of the right pleural fluid on ultrasound is compatible with a posttraumatic hemothorax, which would be compatible with the patient's posterior rib fractures identified on recent CT. This would not be amenable to further image guided drainage. Consider surgical consultation for further management. Read by Candiss Norse, PA-C Electronically Signed   By: Albin Felling M.D.   On: 01/21/2022 16:19     Medications:    sodium chloride     sodium chloride     sodium chloride     piperacillin-tazobactam (ZOSYN)  IV 100 mL/hr at 01/22/22 5956   vancomycin      allopurinol  50 mg Oral Daily   atorvastatin  20 mg Oral Daily   Chlorhexidine Gluconate Cloth  6 each Topical Q0600   cholecalciferol  1,000 Units Oral Daily   famotidine  20 mg Oral Daily   ferrous sulfate  325 mg Oral BID WC   sodium chloride flush  3 mL Intravenous Q12H   tamsulosin  0.4 mg Oral QPC supper     Assessment/ Plan:  [name] is a 81 y.o. Hispanic male who speaks [place] and limited [place] and [place].  Patient with end stage renal disease on hemodialysis, hypertension, BPH, diabetes mellitus type II, hyperlipidemia who presents to [facility] on  01/21/2022 for Urinary retention [R33.9] Back pain [M54.9] UTI (urinary tract infection) [N39.0] Pleural effusion [J90] Pleural effusion on right [J90] Acute cystitis with hematuria [N30.01] Chest pain, unspecified type [R07.9] Sepsis, due to unspecified organism, unspecified whether acute organ dysfunction present ([place]) [A41.9] Acute cough [R05.1]   [facility] TTS [facility] Left AVF 73kg.    End stage renal disease: Continue TTS schedule. Plan to dialyze today, UF goal 1L as tolerated.    Anemia of chronic kidney disease: hemoglobin 7.2 - Continue EPO with dialysis treatments   Hypertension: 112/43. Home regimen includes hydralazine, metoprolol and tamsulosin.   Secondary Hyperparathyroidism: Continue cholecalciferol daily.    LOS: 1   2/7/202311:23 AM

## 2022-01-22 NOTE — Progress Notes (Addendum)
PROGRESS NOTE  Martin Gilbert OHY:[id number] DOB: 11/01/1941 DOA: 01/21/2022 PCP: [name], MD  HPI/Recap of past 24 hours: Martin Gilbert is a 81 y.o. male with medical history significant of ESRD on HD TTS(Makes urine), hypertension, DM2, anemia of chronic disease, BPH, who presents to [facility] ER from home for evaluation of lower abdominal pain and difficulty voiding for about 3 weeks.  Associated with decrease in urinary output, lower back pain and bilateral lower extremity edema.  He was recently hospitalized in December 2022 for acute pulmonary edema secondary to acute on chronic diastolic CHF and at that time he had a cough.    Upon presentation to the ED work-up revealed urinary tract infection, loculated right-sided pleural effusion with concerns for posttraumatic hemothorax in the setting of posterior rib fractures identified on recent CT scan.  He was seen by interventional radiology, 10 cc of serosanguineous fluid was able to be removed.  I have recommended surgical consultation for further management.  Cardiothoracic surgery was consulted and recommended CT guided pigtail placement by pulmonary.  01/22/2022: Patient was seen and examined at his bedside.  His niece was present in the room.  Denies having any chest pain or dyspnea at rest at the time of this visit.  Endorses persistent productive cough.  Assessment/Plan: Principal Problem:   UTI (urinary tract infection) Active Problems:   Generalized weakness   Anemia of chronic disease   ESRD (end stage renal disease) ([name])   Diabetes mellitus, type II ([name])   Pleural effusion on right   Acute prostatitis   Urinary retention  Complicated UTI with acute urinary retention, POA  Presented with UA positive for pyuria in the setting of ESRD and acute urinary retention. Still makes urine. Bladder scan yielded about 200 cc of urine and a Foley catheter was placed draining dirty urine Imaging shows an ill-defined  low-density throughout the prostate gland which could reflect prostate cancer or prostatitis/abscess formation. Started on IV vancomycin and meropenem on admission, continue  Seen by urology Continue to follow urine culture and blood cultures for ID and sensitivities.  Narrow down antibiotics according to cultures results.  Moderate to large right pleural effusion, partially loculated with extensive atelectasis or consolidation in the right mid to lower lung. Status postthoracentesis with 10 cc of serosanguineous fluid removed, follow cultures and fluid analysis. Rule out TB versus malignancy versus orders Continue airborne precautions Personally reviewed chest x-ray done on admission Followed by IR, CTS and pulmonary Plan for pigtail placement for external drainage of pleural effusion Continue to closely monitor and maintain O2 saturation greater than 90%.  Posttraumatic hemothorax in the setting of posterior rib fractures identified on recent CT scan. Plan for pigtail placement by pulmonary Pain management Maintaining saturation greater than 90%    Acute urinary retention status post Foley catheter placement on 01/21/2022. Urology following Continue IV antibiotics empirically. Management per urology  End-stage renal disease on hemodialysis TTS Plan hemodialysis on 01/22/2022. Management per nephrology.  Hypervolemic hyponatremia in the setting of ESRD Serum sodium 130 Plan HD on 01/22/2022 Volume status managed with hemodialysis.  Acute on chronic diastolic CHF Presented with BNP greater than 400, volume overloaded Volume status managed with hemodialysis Last 2D echo done on 03/27/2017 showing normal LVEF with grade 1 diastolic dysfunction Obtain limited 2D echo Continue strict I's and O's and daily weight  Elevated troponin, suspect demand ischemia in the setting of moderate to large right pleural effusion Troponin peaked at 55 and down trended Denies any anginal  symptoms with  some relief visit. Continue to monitor on telemetry Follow 2D echo  Prediabetes Last hemoglobin A1c 4.8 on 10/25/2021 Avoid hypoglycemia   Acute blood loss anemia in the setting of hemothorax/anemia of chronic disease in the setting of ESRD Drop in hemoglobin 7.2K from 8.8K. Transfuse hemoglobin less than 7.0 or if symptomatic, Hg less than 8.0. We will transfuse 1 unit PRBC in the setting of elevated troponin and acute on chronic diastolic CHF. Obtain iron studies in the morning and follow results.   Physical debility PT OT to assess Fall precautions    Critical care time: 65 minutes.    Advance Care Planning:   Code Status: Full Code    Consults: Interventional radiology/CT surgery   Family Communication: Updated niece at bedside.        Code Status: Full code     Disposition Plan: Likely will be discharged to home with home health services when pulmonary and nephrology sign off.   Consultants: Nephrology CTS Pulmonary IR  Procedures: Right thoracentesis  Antimicrobials: IV vancomycin Meropenem  DVT prophylaxis: SQ heparin 3 times daily  Status is: Inpatient  Patient requires at least 2 midnights for further evaluation and treatment of the present condition.      Objective: Vitals:   01/22/22 1007 01/22/22 1054 01/22/22 1121 01/22/22 1140  BP: (!) 116/46  (!) 112/43 (!) 132/58  Pulse: 70  70 69  Resp: (!) 22  (!) 22 (!) 25  Temp:  98.2 F (36.8 C)    TempSrc:  Oral    SpO2: 98%  97%   Weight:      Height:        Intake/Output Summary (Last 24 hours) at 01/22/2022 1153 Last data filed at 01/22/2022 2876 Gross per 24 hour  Intake 83.18 ml  Output --  Net 83.18 ml   Filed Weights   01/21/22 0938 01/22/22 1003  Weight: 90.7 kg 78.1 kg    Exam:  General: 81 y.o. year-old male well developed well nourished in no acute distress.  Alert and oriented x3. Cardiovascular: Regular rate and rhythm with no rubs or gallops.  No thyromegaly  or JVD noted.   Respiratory: Mild rales at bases.  No wheezing noted.  Poor inspiratory effort. Abdomen: Soft nontender nondistended with normal bowel sounds x4 quadrants. Musculoskeletal: Trace lower extremity edema bilaterally. Skin: No ulcerative lesions noted or rashes. Psychiatry: Mood is appropriate for condition and setting Neuro: Moves all 4 extremities.  Nonfocal exam.   Data Reviewed: CBC: Recent Labs  Lab 01/21/22 1025 01/22/22 0424 01/22/22 1029  WBC 12.1* 8.1  --   NEUTROABS 10.2*  --   --   HGB 7.7* 7.2* 7.2*  HCT 23.8* 21.7* 21.8*  MCV 97.9 94.8  --   PLT 226 216  --    Basic Metabolic Panel: Recent Labs  Lab 01/21/22 1025 01/22/22 0424  NA 130* 130*  K 3.9 4.3  CL 96* 97*  CO2 24 23  GLUCOSE 121* 103*  BUN 69* 74*  CREATININE 6.52* 7.52*  CALCIUM 7.9* 7.8*   GFR: Estimated Creatinine Clearance: 7.4 mL/min (A) (by C-G formula based on SCr of 7.52 mg/dL (H)). Liver Function Tests: Recent Labs  Lab 01/21/22 1025  AST 52*  ALT 20  ALKPHOS 443*  BILITOT 1.6*  PROT 6.3*  ALBUMIN 1.9*   Recent Labs  Lab 01/21/22 1025  LIPASE 24   No results for input(s): AMMONIA in the last 168 hours. Coagulation Profile: Recent Labs  Lab 01/21/22 1312  INR 1.2   Cardiac Enzymes: No results for input(s): CKTOTAL, CKMB, CKMBINDEX, TROPONINI in the last 168 hours. BNP (last 3 results) No results for input(s): PROBNP in the last 8760 hours. HbA1C: No results for input(s): HGBA1C in the last 72 hours. CBG: Recent Labs  Lab 01/21/22 2126 01/21/22 2322 01/22/22 0420 01/22/22 0810 01/22/22 1141  GLUCAP 129* 141* 100* 118* 117*   Lipid Profile: No results for input(s): CHOL, HDL, LDLCALC, TRIG, CHOLHDL, LDLDIRECT in the last 72 hours. Thyroid Function Tests: No results for input(s): TSH, T4TOTAL, FREET4, T3FREE, THYROIDAB in the last 72 hours. Anemia Panel: No results for input(s): VITAMINB12, FOLATE, FERRITIN, TIBC, IRON, RETICCTPCT in the last 72  hours. Urine analysis:    Component Value Date/Time   COLORURINE BROWN (A) 01/21/2022 1025   APPEARANCEUR TURBID (A) 01/21/2022 1025   APPEARANCEUR Turbid 08/29/2013 2049   LABSPEC 1.020 01/21/2022 1025   LABSPEC 1.015 08/29/2013 2049   PHURINE 7.0 01/21/2022 1025   GLUCOSEU NEGATIVE 01/21/2022 1025   GLUCOSEU 50 mg/dL 08/29/2013 2049   HGBUR MODERATE (A) 01/21/2022 1025   BILIRUBINUR SMALL (A) 01/21/2022 1025   BILIRUBINUR Negative 08/29/2013 2049   KETONESUR NEGATIVE 01/21/2022 1025   PROTEINUR >300 (A) 01/21/2022 1025   NITRITE POSITIVE (A) 01/21/2022 1025   LEUKOCYTESUR LARGE (A) 01/21/2022 1025   LEUKOCYTESUR 3+ 08/29/2013 2049   Sepsis Labs: @LABRCNTIP (procalcitonin:4,lacticidven:4)  ) Recent Results (from the past 240 hour(s))  Body fluid culture w Gram Stain     Status: None (Preliminary result)   Collection Time: 01/21/22  4:02 PM   Specimen: Chest; Body Fluid  Result Value Ref Range Status   Specimen Description   Final    CHEST FLUID Performed at [facility], [place] [address] [place], [place] [address]    Special Requests   Final    NONE Performed at [facility], [place], [place] [address]    Gram Stain NO WBC SEEN NO ORGANISMS SEEN   Final   Culture   Final    TOO YOUNG TO READ Performed at [facility], [place]  [address] [place], [place] [address]    Report Status PENDING  Incomplete      Studies: DG Lumbar Spine 2-3 Views  Result Date: 01/21/2022 CLINICAL DATA:  Back pain EXAM: LUMBAR SPINE - 2-3 VIEW COMPARISON:  10/11/2018 FINDINGS: No recent fracture is seen. There is previous vertebroplasty in the iliac bones on both sides. Degenerative changes are noted with bony spurs and facet hypertrophy. There is no significant disc space narrowing. Arterial calcifications are seen in the aorta and its major branches. IMPRESSION: No recent fracture is seen. Lumbar spondylosis. No significant interval changes are noted.  Electronically Signed   By: Elmer Picker M.D.   On: 01/21/2022 16:48   DG Chest Port 1 View  Result Date: 01/22/2022 CLINICAL DATA:  Hemothorax, diabetes mellitus, hypertension EXAM: PORTABLE CHEST 1 VIEW COMPARISON:  Portable exam is 1019 hours compared to 01/21/2022 FINDINGS: Enlargement of cardiac silhouette with vascular congestion. Atherosclerotic calcification aorta. Large loculated fluid collection at the lateral RIGHT chest, could represent hemothorax or loculated pleural effusion. Significant atelectasis of mid to lower RIGHT lung. Scattered infiltrates in both lungs. No pneumothorax. Rib fractures seen on prior CT exam are not well visualized radiographically. IMPRESSION: Persistent loculated collection at lateral mid to lower RIGHT hemithorax which could represent hemothorax or loculated effusion. Significant atelectasis of the adjacent mid to lower RIGHT lung  with scattered infiltrates in remaining lungs bilaterally. No pneumothorax. Aortic Atherosclerosis (ICD10-I70.0). Electronically Signed   By: Lavonia Dana M.D.   On: 01/22/2022 10:27   DG Chest Port 1 View  Result Date: 01/21/2022 CLINICAL DATA:  Back pain. Kidney disease with current UTI. Post thoracentesis. EXAM: PORTABLE CHEST 1 VIEW COMPARISON:  AP chest 01/21/2022, CT chest 02/18/2017, AP chest 12/09/2021 FINDINGS: Cardiac silhouette appears mildly enlarged. Mediastinal contours are within normal limits. Mild calcification within aortic arch. There is not a definite change in the increased density apparent represent a taller than wide loculated pleural effusion of the inferolateral right hemithorax. Heterogeneous airspace opacification within the right mid and lower lung. No definite left pleural effusion. No pneumothorax. No definite interstitial pulmonary edema. Moderate multilevel degenerative disc changes of the thoracic spine. IMPRESSION: Persistent moderate-to-large loculated right pleural effusion involving the inferolateral  right hemithorax. Electronically Signed   By: Yvonne Kendall M.D.   On: 01/21/2022 16:49   IR THORACENTESIS ASP PLEURAL SPACE W/IMG GUIDE  Result Date: 01/21/2022 INDICATION: Patient with history of ESRD who presented to the ED with abdominal pain and difficulty urinating found to have large right loculated pleural effusion. Patient noted to have right sided rib fractures on CT abdomen/pelvis today as well. Request to IR for diagnostic right thoracentesis. EXAM: ULTRASOUND GUIDED RIGHT THORACENTESIS MEDICATIONS: 6 mL 1% lidocaine COMPLICATIONS: None immediate. PROCEDURE: An ultrasound guided thoracentesis was thoroughly discussed with the patient and questions answered. The benefits, risks, alternatives and complications were also discussed. The patient understands and wishes to proceed with the procedure. Written consent was obtained. Ultrasound was performed to localize and mark an adequate pocket of fluid in the right chest. The area was then prepped and draped in the normal sterile fashion. 1% Lidocaine was used for local anesthesia. Under ultrasound guidance a 6 Fr Safe-T-Centesis catheter was introduced. Thoracentesis was performed. The catheter was removed and a dressing applied. FINDINGS: A total of approximately 10 mL of serosanguineous fluid was removed. Samples were sent to the laboratory as requested by the clinical team. IMPRESSION: Successful ultrasound guided right thoracentesis yielding 10 mL of pleural fluid. Appearance of the right pleural fluid on ultrasound is compatible with a posttraumatic hemothorax, which would be compatible with the patient's posterior rib fractures identified on recent CT. This would not be amenable to further image guided drainage. Consider surgical consultation for further management. Read by Candiss Norse, PA-C Electronically Signed   By: Albin Felling M.D.   On: 01/21/2022 16:19    Scheduled Meds:  allopurinol  50 mg Oral Daily   atorvastatin  20 mg Oral  Daily   Chlorhexidine Gluconate Cloth  6 each Topical Q0600   cholecalciferol  1,000 Units Oral Daily   epoetin (EPOGEN/PROCRIT) injection  10,000 Units Intravenous Q T,Th,Sa-HD   famotidine  20 mg Oral Daily   ferrous sulfate  325 mg Oral BID WC   sodium chloride flush  3 mL Intravenous Q12H   tamsulosin  0.4 mg Oral QPC supper    Continuous Infusions:  sodium chloride     sodium chloride     sodium chloride     piperacillin-tazobactam (ZOSYN)  IV 100 mL/hr at 01/22/22 0609   vancomycin       LOS: 1 day     Kayleen Memos, MD [facility] Pager [phone]  If 7PM-7AM, please contact night-coverage [email or web address] Password [facility] 01/22/2022, 11:54 AM

## 2022-01-22 NOTE — Progress Notes (Signed)
Hemodialysis  notes  Hd started at 11:21 with a baseline BP of 112/63. At  12:32  BP was 435/68 diastolic trend is going down. pt is asymptomatic.  DR Juleen China was notified with order to turn off UF.   UF  off was maintained until treatment was completed at 13:56. Total treatment time. 2.5 hours. Total Uf net removed: 244ml.

## 2022-01-22 NOTE — Consult Note (Signed)
Chief Complaint: Patient was seen in consultation today for  Chief Complaint  Patient presents with   Abdominal Pain    Referring Physician(s): Dr. Francine Graven  Supervising Physician: Daryll Brod  Patient Status: Martin Gilbert - In-pt  History of Present Illness: Martin Gilbert is an 81 y.o. male with a medical history significant for HTN, DM, CHF and ESRD on hemodialysis. He presented to the [facility] ED 01/21/22 with complaints of suprapubic discomfort and decreased urine output. Lab work up revealed a urinary tract infection. Imaging was notable for a possible malignancy and a loculated right pleural effusion and he presented to IR yesterday for a thoracentesis. The appearance of the fluid on ultrasound was compatible with a post-traumatic hemothorax (posterior rib fractures identified on recent CT) and only 10 ml of serosanguineous fluid was able to be removed. IR recommended surgical consultation for further management.   CT abdomen/pelvis without contrast 01/21/22 IMPRESSION: 1. New ill-defined low density throughout the prostate gland which could reflect prostate cancer or prostatitis/abscess formation. Correlate with serum PSA levels. Recommend urology consultation. 2. Several mildly enlarged pelvic lymph nodes suspicious for metastatic disease. 3. No evidence of hydronephrosis or focal bladder abnormality. Stable chronic renal cortical thinning consistent with chronic renal failure. 4. As seen on earlier chest radiographs, laterally loculated right pleural effusion with air in the pleural space which may indicate empyema. Correlate clinically. Compressive right lower lobe atelectasis or consolidation. 5.  Aortic Atherosclerosis (ICD10-I70.0).  Dr. Kipp Brood with CT surgery was consulted and he recommended pigtail catheter placement for pulmonary to manage. Interventional Radiology has been asked to evaluate this patient for an image-guided right chest tube. Imaging reviewed and  procedure approved by Dr. Annamaria Boots.   Past Medical History:  Diagnosis Date   Anemia    Arthritis    Back pain    BPH (benign prostatic hyperplasia)    CKD (chronic kidney disease), stage IV ([name])    Clostridium difficile colitis    Diabetes mellitus without complication ([name])    GERD (gastroesophageal reflux disease)    HTN (hypertension)    Hyperlipidemia     Past Surgical History:  Procedure Laterality Date   A/V FISTULAGRAM Left 04/20/2018   Procedure: A/V FISTULAGRAM;  Surgeon: Algernon Huxley, MD;  Location: [place] [facility];  Service: Cardiovascular;  Laterality: Left;   A/V FISTULAGRAM Left 11/25/2018   Procedure: A/V FISTULAGRAM;  Surgeon: Algernon Huxley, MD;  Location: [place] [facility];  Service: Cardiovascular;  Laterality: Left;   A/V FISTULAGRAM Left 01/11/2019   Procedure: A/V FISTULAGRAM;  Surgeon: Algernon Huxley, MD;  Location: [place] [facility];  Service: Cardiovascular;  Laterality: Left;   AV FISTULA PLACEMENT Left 06/13/2017   Procedure: ARTERIOVENOUS (AV) FISTULA CREATION ( RADIAL CEPHALIC );  Surgeon: Katha Cabal, MD;  Location: [facility];  Service: Vascular;  Laterality: Left;   CATARACT EXTRACTION     one eye not sure which   DIALYSIS/PERMA CATHETER INSERTION N/A 04/03/2017   Procedure: Dialysis/Perma Catheter Insertion;  Surgeon: Algernon Huxley, MD;  Location: [place] [facility];  Service: Cardiovascular;  Laterality: N/A;   DIALYSIS/PERMA CATHETER INSERTION N/A 08/06/2017   Procedure: DIALYSIS/PERMA CATHETER INSERTION;  Surgeon: Algernon Huxley, MD;  Location: [place] [facility];  Service: Cardiovascular;  Laterality: N/A;   DIALYSIS/PERMA CATHETER REMOVAL N/A 08/04/2017   Procedure: DIALYSIS/PERMA CATHETER REMOVAL;  Surgeon: Algernon Huxley, MD;  Location: [place] [facility];  Service: Cardiovascular;  Laterality: N/A;   DIALYSIS/PERMA CATHETER REMOVAL  N/A 12/31/2017   Procedure: DIALYSIS/PERMA CATHETER REMOVAL;  Surgeon: Algernon Huxley, MD;  Location: [place] [facility];  Service: Cardiovascular;  Laterality: N/A;   hernia repair     IR THORACENTESIS ASP PLEURAL SPACE W/IMG GUIDE  01/21/2022   SACROPLASTY N/A 09/30/2018   Procedure: SACROPLASTY;  Surgeon: Hessie Knows, MD;  Location: [facility];  Service: Orthopedics;  Laterality: N/A;   UPPER EXTREMITY ANGIOGRAPHY Left 09/05/2017   Procedure: Upper Extremity Angiography;  Surgeon: Katha Cabal, MD;  Location: [place] [facility];  Service: Cardiovascular;  Laterality: Left;    Allergies: Patient has no known allergies.  Medications: Prior to Admission medications   Medication Sig Start Date End Date Taking? Authorizing Provider  acetaminophen (TYLENOL) 325 MG tablet Take 2 tablets (650 mg total) by mouth every 6 (six) hours as needed for mild pain (or Fever >/= 101). 11/28/17  Yes Gouru, Illene Silver, MD  allopurinol (ZYLOPRIM) 100 MG tablet Take 0.5 tablets (50 mg total) by mouth daily. Patient taking differently: Take 100 mg by mouth daily. 10/29/21  Yes Wouk, Ailene Rud, MD  ferrous sulfate 325 (65 FE) MG tablet Take 325 mg by mouth 2 (two) times daily with a meal.  08/30/15  Yes [provider]  hydrALAZINE (APRESOLINE) 50 MG tablet Take 50 mg by mouth 3 (three) times daily. 12/18/21  Yes [provider]  lactulose (CHRONULAC) 10 GM/15ML solution Take 20 g (30 mL) 3-4 times a day for a goal of at least 2 bowel movements a day. 04/28/21  Yes Eugenie Filler, MD  metoprolol succinate (TOPROL-XL) 25 MG 24 hr tablet Take 25 mg by mouth daily.   Yes [provider]  pantoprazole (PROTONIX) 40 MG tablet Take 40 mg by mouth daily. 10/10/21  Yes [provider]  tamsulosin (FLOMAX) 0.4 MG CAPS capsule Take 1 capsule (0.4 mg total) by mouth daily after supper. 02/19/17  Yes Epifanio Lesches, MD  albuterol (VENTOLIN HFA) 108 (90 Base) MCG/ACT inhaler Inhale 1-2 puffs into the lungs every 4 (four) hours as needed for cough. 01/16/22   [provider]   atorvastatin (LIPITOR) 20 MG tablet Take 20 mg by mouth daily. Patient not taking: Reported on 01/21/2022    [provider]  famotidine (PEPCID) 20 MG tablet Take 1 tablet (20 mg total) by mouth daily. Patient not taking: Reported on 01/21/2022 04/16/21 04/16/22  Eugenie Filler, MD  guaiFENesin (ROBITUSSIN) 100 MG/5ML liquid Take 10 mLs by mouth every 4 (four) hours as needed for cough or to loosen phlegm. 12/11/21   Fritzi Mandes, MD  ondansetron (ZOFRAN ODT) 4 MG disintegrating tablet Take 1 tablet (4 mg total) by mouth every 8 (eight) hours as needed for nausea or vomiting. Patient not taking: Reported on 01/21/2022 06/15/21   Naaman Plummer, MD  Vitamin D, Cholecalciferol, 1000 UNITS TABS Take 1 tablet by mouth daily.  Patient not taking: Reported on 01/21/2022    [provider]     Family History  Problem Relation Age of Onset   Hypertension Mother    Diabetes Neg Hx     Social History   Socioeconomic History   Marital status: Married    Spouse name: Not on file   Number of children: Not on file   Years of education: Not on file   Highest education level: Not on file  Occupational History   Occupation: retired  Tobacco Use   Smoking status: Never   Smokeless tobacco: Never  Vaping  Use   Vaping Use: Never used  Substance and Sexual Activity   Alcohol use: No    Comment: Drank in the past, but has stopped for several years.    Drug use: No   Sexual activity: Not on file  Other Topics Concern   Not on file  Social History Narrative   Not on file   Social Determinants of Health   Financial Resource Strain: Not on file  Food Insecurity: Not on file  Transportation Needs: Not on file  Physical Activity: Not on file  Stress: Not on file  Social Connections: Not on file    Review of Systems: A 12 point ROS discussed and pertinent positives are indicated in the HPI above.  All other systems are negative.  Review of Systems  Unable to perform ROS: Other    Vital Signs: BP (!) 119/44 (BP Location: Right Arm)    Pulse 78    Temp 97.6 F (36.4 C)    Resp 16    Ht 5\' 5"  (1.651 m)    Wt 172 lb 2.9 oz (78.1 kg)    SpO2 96%    BMI 28.65 kg/m   Physical Exam Constitutional:      Appearance: He is ill-appearing.  HENT:     Mouth/Throat:     Mouth: Mucous membranes are moist.     Pharynx: Oropharynx is clear.  Cardiovascular:     Rate and Rhythm: Normal rate and regular rhythm.     Comments: Left forearm fistula  Pulmonary:     Effort: Pulmonary effort is normal.  Neurological:     Mental Status: He is alert.     Comments: Unable to determine    Imaging: CT ABDOMEN PELVIS WO CONTRAST  Result Date: 01/21/2022 CLINICAL DATA:  Left lower quadrant abdominal pain with difficulty urinating for the past 3 weeks. Diarrhea. History of end-stage renal disease on hemodialysis. EXAM: CT ABDOMEN AND PELVIS WITHOUT CONTRAST TECHNIQUE: Multidetector CT imaging of the abdomen and pelvis was performed following the standard protocol without IV contrast. RADIATION DOSE REDUCTION: This exam was performed according to the departmental dose-optimization program which includes automated exposure control, adjustment of the mA and/or kV according to patient size and/or use of iterative reconstruction technique. COMPARISON:  Abdominopelvic CT 06/15/2021. Chest radiographs earlier today. FINDINGS: Lower chest: As seen on earlier radiographs, there is new moderate-sized laterally loculated right pleural effusion. There are some air bubbles posteriorly in the right pleural space (image 16/2). There is compressive atelectasis or consolidation of the right lower lobe. Streaky left lower lobe pulmonary opacities are similar to the previous study. No evidence of pneumothorax. Atherosclerosis of the aorta and coronary arteries noted. Hepatobiliary: Nodular contours of the liver again noted suspicious for early cirrhosis. No focal lesion identified without contrast. No evidence of  gallstones, gallbladder wall thickening or biliary dilatation. Pancreas: Unremarkable. No pancreatic ductal dilatation or surrounding inflammatory changes. Spleen: Normal in size without focal abnormality. Adrenals/Urinary Tract: Both adrenal glands appear normal. Bilateral renal cortical thinning with nonspecific perinephric soft tissue stranding again noted bilaterally. Small low-density renal lesions bilaterally are stable, likely cysts. No evidence of urinary tract calculus or hydronephrosis. The bladder appears grossly unremarkable. Stomach/Bowel: No enteric contrast administered. The stomach is poorly distended with possible generalized wall thickening, similar to the previous study. No evidence of bowel wall thickening, distention or surrounding inflammation. Vascular/Lymphatic: There are several mildly enlarged lymph nodes in the pelvis, including right iliac nodes measuring 8 mm on image 75/2 and  12 mm on image 81/2. There is a 10 mm left external iliac node on image 80/2. Aortic and branch vessel atherosclerosis without acute vascular findings on noncontrast imaging. Reproductive: The prostate gland is enlarged. There are multiple new areas of ill-defined low-density within the gland, measuring up to 3.1 x 2.6 cm on image 92/2. No definite surrounding inflammatory changes to suggest that this is inflammatory. Mild perirectal soft tissue stranding is similar to the previous study. Other: Mild soft tissue stranding throughout the mesenteric fat without significant ascites. No free air. Musculoskeletal: No acute or significant osseous findings. Previous bilateral iliac bone osteoplasty. IMPRESSION: 1. New ill-defined low density throughout the prostate gland which could reflect prostate cancer or prostatitis/abscess formation. Correlate with serum PSA levels. Recommend urology consultation. 2. Several mildly enlarged pelvic lymph nodes suspicious for metastatic disease. 3. No evidence of hydronephrosis or  focal bladder abnormality. Stable chronic renal cortical thinning consistent with chronic renal failure. 4. As seen on earlier chest radiographs, laterally loculated right pleural effusion with air in the pleural space which may indicate empyema. Correlate clinically. Compressive right lower lobe atelectasis or consolidation. 5.  Aortic Atherosclerosis (ICD10-I70.0). Electronically Signed   By: Richardean Sale M.D.   On: 01/21/2022 11:59   DG Chest 2 View  Result Date: 01/21/2022 CLINICAL DATA:  81 year old male with history of chest pain and cough. EXAM: CHEST - 2 VIEW COMPARISON:  Chest x-ray 12/09/2021. FINDINGS: Diffuse interstitial prominence and widespread peribronchial cuffing again noted. New moderate to large partially loculated right pleural effusion with increasing atelectasis and/or consolidation throughout the right mid to lower lung. Probable areas of subsegmental atelectasis at the left lung base. No definite left pleural effusion. No pneumothorax. No evidence of pulmonary edema. Heart size is mildly enlarged. Upper mediastinal contours are within normal limits. IMPRESSION: 1. New moderate to large right pleural effusion which is likely partially loculated with extensive atelectasis and/or consolidation in the right mid to lower lung. 2. Probable subsegmental atelectasis in the left lung base. 3. Diffuse interstitial prominence and peribronchial cuffing, concerning for an acute bronchitis. 4. Mild cardiomegaly. 5. Aortic atherosclerosis. Electronically Signed   By: Vinnie Langton M.D.   On: 01/21/2022 10:48   DG Lumbar Spine 2-3 Views  Result Date: 01/21/2022 CLINICAL DATA:  Back pain EXAM: LUMBAR SPINE - 2-3 VIEW COMPARISON:  10/11/2018 FINDINGS: No recent fracture is seen. There is previous vertebroplasty in the iliac bones on both sides. Degenerative changes are noted with bony spurs and facet hypertrophy. There is no significant disc space narrowing. Arterial calcifications are seen in the  aorta and its major branches. IMPRESSION: No recent fracture is seen. Lumbar spondylosis. No significant interval changes are noted. Electronically Signed   By: Elmer Picker M.D.   On: 01/21/2022 16:48   DG Chest Port 1 View  Result Date: 01/22/2022 CLINICAL DATA:  Hemothorax, diabetes mellitus, hypertension EXAM: PORTABLE CHEST 1 VIEW COMPARISON:  Portable exam is 1019 hours compared to 01/21/2022 FINDINGS: Enlargement of cardiac silhouette with vascular congestion. Atherosclerotic calcification aorta. Large loculated fluid collection at the lateral RIGHT chest, could represent hemothorax or loculated pleural effusion. Significant atelectasis of mid to lower RIGHT lung. Scattered infiltrates in both lungs. No pneumothorax. Rib fractures seen on prior CT exam are not well visualized radiographically. IMPRESSION: Persistent loculated collection at lateral mid to lower RIGHT hemithorax which could represent hemothorax or loculated effusion. Significant atelectasis of the adjacent mid to lower RIGHT lung with scattered infiltrates in remaining lungs bilaterally. No pneumothorax. Aortic  Atherosclerosis (ICD10-I70.0). Electronically Signed   By: Lavonia Dana M.D.   On: 01/22/2022 10:27   DG Chest Port 1 View  Result Date: 01/21/2022 CLINICAL DATA:  Back pain. Kidney disease with current UTI. Post thoracentesis. EXAM: PORTABLE CHEST 1 VIEW COMPARISON:  AP chest 01/21/2022, CT chest 02/18/2017, AP chest 12/09/2021 FINDINGS: Cardiac silhouette appears mildly enlarged. Mediastinal contours are within normal limits. Mild calcification within aortic arch. There is not a definite change in the increased density apparent represent a taller than wide loculated pleural effusion of the inferolateral right hemithorax. Heterogeneous airspace opacification within the right mid and lower lung. No definite left pleural effusion. No pneumothorax. No definite interstitial pulmonary edema. Moderate multilevel degenerative disc  changes of the thoracic spine. IMPRESSION: Persistent moderate-to-large loculated right pleural effusion involving the inferolateral right hemithorax. Electronically Signed   By: Yvonne Kendall M.D.   On: 01/21/2022 16:49   IR THORACENTESIS ASP PLEURAL SPACE W/IMG GUIDE  Result Date: 01/21/2022 INDICATION: Patient with history of ESRD who presented to the ED with abdominal pain and difficulty urinating found to have large right loculated pleural effusion. Patient noted to have right sided rib fractures on CT abdomen/pelvis today as well. Request to IR for diagnostic right thoracentesis. EXAM: ULTRASOUND GUIDED RIGHT THORACENTESIS MEDICATIONS: 6 mL 1% lidocaine COMPLICATIONS: None immediate. PROCEDURE: An ultrasound guided thoracentesis was thoroughly discussed with the patient and questions answered. The benefits, risks, alternatives and complications were also discussed. The patient understands and wishes to proceed with the procedure. Written consent was obtained. Ultrasound was performed to localize and mark an adequate pocket of fluid in the right chest. The area was then prepped and draped in the normal sterile fashion. 1% Lidocaine was used for local anesthesia. Under ultrasound guidance a 6 Fr Safe-T-Centesis catheter was introduced. Thoracentesis was performed. The catheter was removed and a dressing applied. FINDINGS: A total of approximately 10 mL of serosanguineous fluid was removed. Samples were sent to the laboratory as requested by the clinical team. IMPRESSION: Successful ultrasound guided right thoracentesis yielding 10 mL of pleural fluid. Appearance of the right pleural fluid on ultrasound is compatible with a posttraumatic hemothorax, which would be compatible with the patient's posterior rib fractures identified on recent CT. This would not be amenable to further image guided drainage. Consider surgical consultation for further management. Read by Candiss Norse, PA-C Electronically Signed    By: Albin Felling M.D.   On: 01/21/2022 16:19    Labs:  CBC: Recent Labs    12/09/21 1833 12/10/21 1525 01/21/22 1025 01/22/22 0424  WBC 6.1 5.6 12.1* 8.1  HGB 8.6* 8.8* 7.7* 7.2*  HCT 26.8* 27.5* 23.8* 21.7*  PLT 212 229 226 216    COAGS: Recent Labs    10/25/21 2016 01/21/22 1312  INR 1.2 1.2  APTT 43* 49*    BMP: Recent Labs    12/09/21 1507 12/09/21 1833 12/10/21 1525 01/21/22 1025 01/22/22 0424  NA 138  --  137 130* 130*  K 4.6  --  4.6 3.9 4.3  CL 103  --  103 96* 97*  CO2 24  --  23 24 23   GLUCOSE 108*  --  95 121* 103*  BUN 61*  --  62* 69* 74*  CALCIUM 8.6*  --  8.4* 7.9* 7.8*  CREATININE 7.56* 7.82* 7.64* 6.52* 7.52*  GFRNONAA 7* 6* 7* 8* 7*    LIVER FUNCTION TESTS: Recent Labs    10/25/21 2015 10/26/21 0436 10/27/21 0421 10/29/21 0531 12/09/21 1507  12/10/21 1525 01/21/22 1025  BILITOT 1.2 1.1  --   --  1.4*  --  1.6*  AST 52* 41  --   --  34  --  52*  ALT 27 26  --   --  22  --  20  ALKPHOS 327* 298*  --   --  276*  --  443*  PROT 6.7 6.5  --   --  7.2  --  6.3*  ALBUMIN 2.9* 2.6*   < > 2.7* 2.5* 2.6* 1.9*   < > = values in this interval not displayed.    TUMOR MARKERS: No results for input(s): AFPTM, CEA, CA199, CHROMGRNA in the last 8760 hours.  Assessment and Plan:  Loculated right pleural effusion: Martin Gilbert, 81 year old male, is tentatively scheduled today for an image-guided right chest tube placement. The procedure was discussed with his daughter (at the bedside) and his granddaughter via phone. The patient is Spanish-speaking only. Patient is currently on airborne precautions while TB studies are pending. Patient currently receiving hemodialysis treatment at the bedside. He has been NPO since 0900 today.   Risks and benefits discussed with the patient including bleeding, infection, damage to adjacent structures and sepsis.  All of the patient's questions were answered, patient is agreeable to proceed.  Consent  signed and in the IR APP office.   Thank you for this interesting consult.  I greatly enjoyed meeting Martin Gilbert and look forward to participating in their care.  A copy of this report was sent to the requesting provider on this date.  Electronically Signed: Soyla Dryer, AGACNP-BC [phone] 01/22/2022, 10:41 AM   I spent a total of 20 Minutes    in face to face in clinical consultation, greater than 50% of which was counseling/coordinating care for right chest tube

## 2022-01-23 ENCOUNTER — Ambulatory Visit: Payer: Medicare Other | Admitting: Internal Medicine

## 2022-01-23 ENCOUNTER — Inpatient Hospital Stay: Payer: Medicare Other

## 2022-01-23 DIAGNOSIS — R4182 Altered mental status, unspecified: Secondary | ICD-10-CM

## 2022-01-23 DIAGNOSIS — D638 Anemia in other chronic diseases classified elsewhere: Secondary | ICD-10-CM | POA: Diagnosis not present

## 2022-01-23 DIAGNOSIS — J90 Pleural effusion, not elsewhere classified: Secondary | ICD-10-CM | POA: Diagnosis not present

## 2022-01-23 DIAGNOSIS — N401 Enlarged prostate with lower urinary tract symptoms: Secondary | ICD-10-CM

## 2022-01-23 DIAGNOSIS — R9389 Abnormal findings on diagnostic imaging of other specified body structures: Secondary | ICD-10-CM | POA: Diagnosis not present

## 2022-01-23 DIAGNOSIS — N3000 Acute cystitis without hematuria: Secondary | ICD-10-CM | POA: Diagnosis not present

## 2022-01-23 DIAGNOSIS — Z992 Dependence on renal dialysis: Secondary | ICD-10-CM | POA: Diagnosis not present

## 2022-01-23 DIAGNOSIS — N186 End stage renal disease: Secondary | ICD-10-CM | POA: Diagnosis not present

## 2022-01-23 LAB — ACID FAST SMEAR (AFB, MYCOBACTERIA): Acid Fast Smear: NEGATIVE

## 2022-01-23 LAB — RENAL FUNCTION PANEL
Albumin: 1.8 g/dL — ABNORMAL LOW (ref 3.5–5.0)
Anion gap: 9 (ref 5–15)
BUN: 39 mg/dL — ABNORMAL HIGH (ref 8–23)
CO2: 26 mmol/L (ref 22–32)
Calcium: 7.7 mg/dL — ABNORMAL LOW (ref 8.9–10.3)
Chloride: 98 mmol/L (ref 98–111)
Creatinine, Ser: 5.17 mg/dL — ABNORMAL HIGH (ref 0.61–1.24)
GFR, Estimated: 11 mL/min — ABNORMAL LOW (ref >60)
Glucose, Bld: 123 mg/dL — ABNORMAL HIGH (ref 70–99)
Phosphorus: 2.7 mg/dL (ref 2.5–4.6)
Potassium: 3.7 mmol/L (ref 3.5–5.1)
Sodium: 133 mmol/L — ABNORMAL LOW (ref 135–145)

## 2022-01-23 LAB — IRON AND TIBC
Iron: 18 ug/dL — ABNORMAL LOW (ref 45–182)
Saturation Ratios: 14 % — ABNORMAL LOW (ref 17.9–39.5)
TIBC: 132 ug/dL — ABNORMAL LOW (ref 250–450)
UIBC: 114 ug/dL

## 2022-01-23 LAB — AMMONIA: Ammonia: 37 umol/L — ABNORMAL HIGH (ref 9–35)

## 2022-01-23 LAB — GLUCOSE, CAPILLARY
Glucose-Capillary: 104 mg/dL — ABNORMAL HIGH (ref 70–99)
Glucose-Capillary: 107 mg/dL — ABNORMAL HIGH (ref 70–99)
Glucose-Capillary: 108 mg/dL — ABNORMAL HIGH (ref 70–99)
Glucose-Capillary: 117 mg/dL — ABNORMAL HIGH (ref 70–99)
Glucose-Capillary: 117 mg/dL — ABNORMAL HIGH (ref 70–99)
Glucose-Capillary: 119 mg/dL — ABNORMAL HIGH (ref 70–99)
Glucose-Capillary: 75 mg/dL (ref 70–99)

## 2022-01-23 LAB — BLOOD GAS, ARTERIAL
Acid-Base Excess: 2.6 mmol/L — ABNORMAL HIGH (ref 0.0–2.0)
Bicarbonate: 27.2 mmol/L (ref 20.0–28.0)
Drawn by: 30136
FIO2: 32
O2 Saturation: 88.6 %
Patient temperature: 37
pCO2 arterial: 41 mmHg (ref 32.0–48.0)
pH, Arterial: 7.43 (ref 7.350–7.450)
pO2, Arterial: 54 mmHg — ABNORMAL LOW (ref 83.0–108.0)

## 2022-01-23 LAB — RESP PANEL BY RT-PCR (FLU A&B, COVID) ARPGX2
Influenza A by PCR: NEGATIVE
Influenza B by PCR: NEGATIVE
SARS Coronavirus 2 by RT PCR: NEGATIVE

## 2022-01-23 LAB — ECHOCARDIOGRAM LIMITED
Area-P 1/2: 2.87 cm2
Height: 65 in
S' Lateral: 3.2 cm
Weight: 2719.59 oz

## 2022-01-23 LAB — RETICULOCYTES
Immature Retic Fract: 26.5 % — ABNORMAL HIGH (ref 2.3–15.9)
RBC.: 2.78 MIL/uL — ABNORMAL LOW (ref 4.22–5.81)
Retic Count, Absolute: 53.4 10*3/uL (ref 19.0–186.0)
Retic Ct Pct: 1.9 % (ref 0.4–3.1)

## 2022-01-23 LAB — FERRITIN: Ferritin: 367 ng/mL — ABNORMAL HIGH (ref 24–336)

## 2022-01-23 LAB — CBC
HCT: 26 % — ABNORMAL LOW (ref 39.0–52.0)
Hemoglobin: 8.5 g/dL — ABNORMAL LOW (ref 13.0–17.0)
MCH: 30.8 pg (ref 26.0–34.0)
MCHC: 32.7 g/dL (ref 30.0–36.0)
MCV: 94.2 fL (ref 80.0–100.0)
Platelets: 198 10*3/uL (ref 150–400)
RBC: 2.76 MIL/uL — ABNORMAL LOW (ref 4.22–5.81)
RDW: 17.4 % — ABNORMAL HIGH (ref 11.5–15.5)
WBC: 11 10*3/uL — ABNORMAL HIGH (ref 4.0–10.5)
nRBC: 0 % (ref 0.0–0.2)

## 2022-01-23 LAB — CYTOLOGY - NON PAP

## 2022-01-23 LAB — HEPATITIS B SURFACE ANTIBODY, QUANTITATIVE: Hep B S AB Quant (Post): 9.3 m[IU]/mL — ABNORMAL LOW (ref >9.9)

## 2022-01-23 LAB — PSA: Prostatic Specific Antigen: 2.32 ng/mL (ref 0.00–4.00)

## 2022-01-23 MED ORDER — LORAZEPAM 2 MG/ML IJ SOLN
0.5000 mg | Freq: Four times a day (QID) | INTRAMUSCULAR | Status: DC | PRN
  Administered 2022-02-05: 0.5 mg via INTRAVENOUS
  Filled 2022-01-23 (×2): qty 1

## 2022-01-23 MED ORDER — IPRATROPIUM-ALBUTEROL 0.5-2.5 (3) MG/3ML IN SOLN: 3.0000 mL | RESPIRATORY_TRACT | Status: DC | PRN

## 2022-01-23 MED ORDER — SODIUM CHLORIDE 0.9 % IV SOLN: INTRAVENOUS | Status: DC

## 2022-01-23 MED ORDER — DEXTROSE 50 % IV SOLN
25.0000 mL | Freq: Once | INTRAVENOUS | Status: AC
  Administered 2022-01-23: 25 mL via INTRAVENOUS
  Filled 2022-01-23: qty 50

## 2022-01-23 MED ORDER — SODIUM CHLORIDE 0.9 % IV BOLUS
250.0000 mL | Freq: Once | INTRAVENOUS | Status: AC
  Administered 2022-01-23: 250 mL via INTRAVENOUS

## 2022-01-23 MED ORDER — LACTULOSE 10 GM/15ML PO SOLN
20.0000 g | Freq: Three times a day (TID) | ORAL | Status: DC
  Administered 2022-01-23 – 2022-02-01 (×21): 20 g via ORAL
  Filled 2022-01-23 (×25): qty 30

## 2022-01-23 MED ORDER — LORAZEPAM 2 MG/ML IJ SOLN
1.0000 mg | Freq: Four times a day (QID) | INTRAMUSCULAR | Status: DC | PRN
  Administered 2022-01-23: 1 mg via INTRAVENOUS
  Filled 2022-01-23: qty 1

## 2022-01-23 MED ORDER — HALOPERIDOL LACTATE 5 MG/ML IJ SOLN
1.0000 mg | Freq: Four times a day (QID) | INTRAMUSCULAR | Status: DC | PRN
  Administered 2022-01-23 – 2022-01-29 (×2): 1 mg via INTRAVENOUS
  Filled 2022-01-23 (×2): qty 1

## 2022-01-23 MED ORDER — LACTULOSE ENEMA
300.0000 mL | Freq: Once | ORAL | Status: DC
  Filled 2022-01-23: qty 300

## 2022-01-23 NOTE — Progress Notes (Addendum)
Pulmonary Medicine          Date: 01/23/2022,   MRN# [id number] Martin Gilbert 08-21-1941     A  HISTORY OF PRESENT ILLNESS   Intermittent confusion, son in room, spoke with daughter. Chart reviewed patient seen.  Pulm wise marginal oxygenation. Right chest tube in. 400 cc drainage. On cxr large residual loculated effusion noted.    PAST MEDICAL HISTORY   Past Medical History:  Diagnosis Date   Anemia    Arthritis    Back pain    BPH (benign prostatic hyperplasia)    CKD (chronic kidney disease), stage IV ([name])    Clostridium difficile colitis    Diabetes mellitus without complication ([name])    GERD (gastroesophageal reflux disease)    HTN (hypertension)    Hyperlipidemia      SURGICAL HISTORY   Past Surgical History:  Procedure Laterality Date   A/V FISTULAGRAM Left 04/20/2018   Procedure: A/V FISTULAGRAM;  Surgeon: Algernon Huxley, MD;  Location: [facility];  Service: Cardiovascular;  Laterality: Left;   A/V FISTULAGRAM Left 11/25/2018   Procedure: A/V FISTULAGRAM;  Surgeon: Algernon Huxley, MD;  Location: [facility];  Service: Cardiovascular;  Laterality: Left;   A/V FISTULAGRAM Left 01/11/2019   Procedure: A/V FISTULAGRAM;  Surgeon: Algernon Huxley, MD;  Location: [facility];  Service: Cardiovascular;  Laterality: Left;   AV FISTULA PLACEMENT Left 06/13/2017   Procedure: ARTERIOVENOUS (AV) FISTULA CREATION ( RADIAL CEPHALIC );  Surgeon: Katha Cabal, MD;  Location: [facility];  Service: Vascular;  Laterality: Left;   CATARACT EXTRACTION     one eye not sure which   DIALYSIS/PERMA CATHETER INSERTION N/A 04/03/2017   Procedure: Dialysis/Perma Catheter Insertion;  Surgeon: Algernon Huxley, MD;  Location: [facility];  Service: Cardiovascular;  Laterality: N/A;   DIALYSIS/PERMA CATHETER INSERTION N/A 08/06/2017   Procedure: DIALYSIS/PERMA CATHETER INSERTION;  Surgeon: Algernon Huxley, MD;  Location: [facility];  Service:  Cardiovascular;  Laterality: N/A;   DIALYSIS/PERMA CATHETER REMOVAL N/A 08/04/2017   Procedure: DIALYSIS/PERMA CATHETER REMOVAL;  Surgeon: Algernon Huxley, MD;  Location: [facility];  Service: Cardiovascular;  Laterality: N/A;   DIALYSIS/PERMA CATHETER REMOVAL N/A 12/31/2017   Procedure: DIALYSIS/PERMA CATHETER REMOVAL;  Surgeon: Algernon Huxley, MD;  Location: [facility];  Service: Cardiovascular;  Laterality: N/A;   hernia repair     IR THORACENTESIS ASP PLEURAL SPACE W/IMG GUIDE  01/21/2022   SACROPLASTY N/A 09/30/2018   Procedure: SACROPLASTY;  Surgeon: Hessie Knows, MD;  Location: [facility];  Service: Orthopedics;  Laterality: N/A;   UPPER EXTREMITY ANGIOGRAPHY Left 09/05/2017   Procedure: Upper Extremity Angiography;  Surgeon: Katha Cabal, MD;  Location: [facility];  Service: Cardiovascular;  Laterality: Left;     FAMILY HISTORY   Family History  Problem Relation Age of Onset   Hypertension Mother    Diabetes Neg Hx      SOCIAL HISTORY   Social History   Tobacco Use   Smoking status: Never   Smokeless tobacco: Never  Vaping Use   Vaping Use: Never used  Substance Use Topics   Alcohol use: No    Comment: Drank in the past, but has stopped for several years.    Drug use: No     MEDICATIONS    Home Medication:    Current Medication:  Current Facility-Administered Medications:    0.9 %  sodium chloride infusion (Manually program via Guardrails IV Fluids), , Intravenous, Once, Hall, Carole N, DO   0.9 %  sodium chloride infusion, 250 mL, Intravenous, PRN, Agbata, Tochukwu, MD   0.9 %  sodium chloride infusion, 100 mL, Intravenous, PRN, Breeze, Shantelle, NP   0.9 %  sodium chloride infusion, 100 mL, Intravenous, PRN, Gwyneth Revels, Shantelle, NP   acetaminophen (TYLENOL) tablet 650 mg, 650 mg, Oral, Q6H PRN, Agbata, Tochukwu, MD, 650 mg at 01/21/22 2155   allopurinol (ZYLOPRIM) tablet 50 mg, 50 mg, Oral, Daily, Agbata, Tochukwu, MD, 50 mg at  01/23/22 0949   alteplase (CATHFLO ACTIVASE) injection 2 mg, 2 mg, Intracatheter, Once PRN, Colon Flattery, NP   atorvastatin (LIPITOR) tablet 20 mg, 20 mg, Oral, Daily, Agbata, Tochukwu, MD, 20 mg at 01/23/22 0949   Chlorhexidine Gluconate Cloth 2 % PADS 6 each, 6 each, Topical, Q0600, Colon Flattery, NP, 6 each at 01/23/22 0950   cholecalciferol (VITAMIN D) tablet 1,000 Units, 1,000 Units, Oral, Daily, Agbata, Tochukwu, MD, 1,000 Units at 01/23/22 0950   epoetin alfa (EPOGEN) injection 10,000 Units, 10,000 Units, Intravenous, Q T,Th,Sa-HD, Breeze, Shantelle, NP, 10,000 Units at 01/22/22 1416   famotidine (PEPCID) tablet 20 mg, 20 mg, Oral, Daily, Agbata, Tochukwu, MD, 20 mg at 01/23/22 0949   ferrous sulfate tablet 325 mg, 325 mg, Oral, BID WC, Agbata, Tochukwu, MD, 325 mg at 01/23/22 0949   guaiFENesin (ROBITUSSIN) 100 MG/5ML liquid 10 mL, 10 mL, Oral, Q4H PRN, Agbata, Tochukwu, MD, 10 mL at 01/23/22 1217   haloperidol lactate (HALDOL) injection 1 mg, 1 mg, Intravenous, Q6H PRN, Starla Link, Kshitiz, MD, 1 mg at 01/23/22 0916   heparin injection 1,000 Units, 1,000 Units, Dialysis, PRN, Colon Flattery, NP   heparin injection 5,000 Units, 5,000 Units, Subcutaneous, Q8H, Hall, Carole N, DO, 5,000 Units at 01/23/22 1409   HYDROmorphone (DILAUDID) injection 0.5 mg, 0.5 mg, Intravenous, Q4H PRN, [place] Crane, Carole N, DO, 0.5 mg at 01/23/22 1217   ipratropium-albuterol (DUONEB) 0.5-2.5 (3) MG/3ML nebulizer solution 3 mL, 3 mL, Nebulization, Q4H PRN, Starla Link, Kshitiz, MD   lactulose (CHRONULAC) 10 GM/15ML solution 20 g, 20 g, Oral, TID, Alekh, Kshitiz, MD, 20 g at 01/23/22 1114   lidocaine (PF) (XYLOCAINE) 1 % injection 5 mL, 5 mL, Intradermal, PRN, Breeze, Shantelle, NP   lidocaine-prilocaine (EMLA) cream 1 application, 1 application, Topical, PRN, Gwyneth Revels, Shantelle, NP   LORazepam (ATIVAN) injection 1 mg, 1 mg, Intravenous, Q6H PRN, Starla Link, Kshitiz, MD, 1 mg at 01/23/22 1409   melatonin tablet 2.5 mg, 2.5  mg, Oral, QHS PRN, Irene Pap N, DO   oxyCODONE (Oxy IR/ROXICODONE) immediate release tablet 5 mg, 5 mg, Oral, Q6H PRN, Hall, Carole N, DO, 5 mg at 01/23/22 1117   pentafluoroprop-tetrafluoroeth (GEBAUERS) aerosol 1 application, 1 application, Topical, PRN, Breeze, Shantelle, NP   piperacillin-tazobactam (ZOSYN) IVPB 2.25 g, 2.25 g, Intravenous, Q8H, Darnelle Bos, RPH, Last Rate: 100 mL/hr at 01/23/22 0949, 2.25 g at 01/23/22 0949   prochlorperazine (COMPAZINE) injection 5 mg, 5 mg, Intravenous, Q6H PRN, Hall, Carole N, DO   senna-docusate (Senokot-S) tablet 2 tablet, 2 tablet, Oral, QHS, Hall, Carole N, DO   sodium chloride flush (NS) 0.9 % injection 3 mL, 3 mL, Intravenous, Q12H, Agbata, Tochukwu, MD, 3 mL at 01/23/22 0950   sodium chloride flush (NS) 0.9 % injection 3 mL, 3 mL, Intravenous, PRN, Agbata, Tochukwu, MD   tamsulosin (FLOMAX) capsule 0.4 mg, 0.4 mg, Oral, QPC supper, Agbata, Tochukwu, MD, 0.4 mg at 01/22/22 1757  vancomycin (VANCOCIN) IVPB 1000 mg/200 mL premix, 1,000 mg, Intravenous, Q T,Th,Sa-HD, Darnelle Bos, RPH, Stopped at 01/22/22 1739    ALLERGIES   Patient has no known allergies.     REVIEW OF SYSTEMS    Review of Systems:  Gen:  Denies  fever, sweats, chills weigh loss  HEENT: Denies blurred vision, double vision, ear pain, eye pain, hearing loss, nose bleeds, sore throat Cardiac:  No dizziness, chest pain or heaviness, chest tightness,edema Resp:   Denies cough or sputum porduction, shortness of breath,wheezing, hemoptysis,  Gi: Denies swallowing difficulty, stomach pain, nausea or vomiting, diarrhea, constipation, bowel incontinence Gu:  Denies bladder incontinence, burning urine Ext:   Denies Joint pain, stiffness or swelling Skin: Denies  skin rash, easy bruising or bleeding or hives Endoc:  Denies polyuria, polydipsia , polyphagia or weight change Psych:   Denies depression, insomnia or hallucinations   Other:  All other systems  negative   VS: BP (!) 91/40    Pulse 86    Temp 98.9 F (37.2 C)    Resp 17    Ht 5\' 5"  (1.651 m)    Wt 77.1 kg    SpO2 94%    BMI 28.29 kg/m      PHYSICAL EXAM    GENERAL:NAD, no fevers, chills, no weakness no fatigue HEAD: Normocephalic, atraumatic.  EYES: Pupils equal, round, reactive to light. Extraocular muscles intact. No scleral icterus.  MOUTH: Moist mucosal membrane. Dentition intact. No abscess noted.  EAR, NOSE, THROAT: Clear without exudates. No external lesions.  NECK: Supple. No thyromegaly. No nodules. No JVD.  PULMONARY: Diffuse coarse rhonchi right sided +wheezes CARDIOVASCULAR: S1 and S2. Regular rate and rhythm. No murmurs, rubs, or gallops. No edema. Pedal pulses 2+ bilaterally.  GASTROINTESTINAL: Soft, nontender, nondistended. No masses. Positive bowel sounds. No hepatosplenomegaly.  MUSCULOSKELETAL: No swelling, clubbing, or edema. Range of motion full in all extremities.  NEUROLOGIC: Cranial nerves II through XII are intact. No gross focal neurological deficits. Sensation intact. Reflexes intact.  SKIN: No ulceration, lesions, rashes, or cyanosis. Skin warm and dry. Turgor intact.  PSYCHIATRIC: Mood, affect within normal limits. The patient is awake, alert and oriented x 3. Insight, judgment intact.       IMAGING    CT ABDOMEN PELVIS WO CONTRAST  Result Date: 01/21/2022 CLINICAL DATA:  Left lower quadrant abdominal pain with difficulty urinating for the past 3 weeks. Diarrhea. History of end-stage renal disease on hemodialysis. EXAM: CT ABDOMEN AND PELVIS WITHOUT CONTRAST TECHNIQUE: Multidetector CT imaging of the abdomen and pelvis was performed following the standard protocol without IV contrast. RADIATION DOSE REDUCTION: This exam was performed according to the departmental dose-optimization program which includes automated exposure control, adjustment of the mA and/or kV according to patient size and/or use of iterative reconstruction technique. COMPARISON:   Abdominopelvic CT 06/15/2021. Chest radiographs earlier today. FINDINGS: Lower chest: As seen on earlier radiographs, there is new moderate-sized laterally loculated right pleural effusion. There are some air bubbles posteriorly in the right pleural space (image 16/2). There is compressive atelectasis or consolidation of the right lower lobe. Streaky left lower lobe pulmonary opacities are similar to the previous study. No evidence of pneumothorax. Atherosclerosis of the aorta and coronary arteries noted. Hepatobiliary: Nodular contours of the liver again noted suspicious for early cirrhosis. No focal lesion identified without contrast. No evidence of gallstones, gallbladder wall thickening or biliary dilatation. Pancreas: Unremarkable. No pancreatic ductal dilatation or surrounding inflammatory changes. Spleen: Normal in size without focal abnormality.  Adrenals/Urinary Tract: Both adrenal glands appear normal. Bilateral renal cortical thinning with nonspecific perinephric soft tissue stranding again noted bilaterally. Small low-density renal lesions bilaterally are stable, likely cysts. No evidence of urinary tract calculus or hydronephrosis. The bladder appears grossly unremarkable. Stomach/Bowel: No enteric contrast administered. The stomach is poorly distended with possible generalized wall thickening, similar to the previous study. No evidence of bowel wall thickening, distention or surrounding inflammation. Vascular/Lymphatic: There are several mildly enlarged lymph nodes in the pelvis, including right iliac nodes measuring 8 mm on image 75/2 and 12 mm on image 81/2. There is a 10 mm left external iliac node on image 80/2. Aortic and branch vessel atherosclerosis without acute vascular findings on noncontrast imaging. Reproductive: The prostate gland is enlarged. There are multiple new areas of ill-defined low-density within the gland, measuring up to 3.1 x 2.6 cm on image 92/2. No definite surrounding  inflammatory changes to suggest that this is inflammatory. Mild perirectal soft tissue stranding is similar to the previous study. Other: Mild soft tissue stranding throughout the mesenteric fat without significant ascites. No free air. Musculoskeletal: No acute or significant osseous findings. Previous bilateral iliac bone osteoplasty. IMPRESSION: 1. New ill-defined low density throughout the prostate gland which could reflect prostate cancer or prostatitis/abscess formation. Correlate with serum PSA levels. Recommend urology consultation. 2. Several mildly enlarged pelvic lymph nodes suspicious for metastatic disease. 3. No evidence of hydronephrosis or focal bladder abnormality. Stable chronic renal cortical thinning consistent with chronic renal failure. 4. As seen on earlier chest radiographs, laterally loculated right pleural effusion with air in the pleural space which may indicate empyema. Correlate clinically. Compressive right lower lobe atelectasis or consolidation. 5.  Aortic Atherosclerosis (ICD10-I70.0). Electronically Signed   By: Richardean Sale M.D.   On: 01/21/2022 11:59   DG Chest 2 View  Result Date: 01/21/2022 CLINICAL DATA:  81 year old male with history of chest pain and cough. EXAM: CHEST - 2 VIEW COMPARISON:  Chest x-ray 12/09/2021. FINDINGS: Diffuse interstitial prominence and widespread peribronchial cuffing again noted. New moderate to large partially loculated right pleural effusion with increasing atelectasis and/or consolidation throughout the right mid to lower lung. Probable areas of subsegmental atelectasis at the left lung base. No definite left pleural effusion. No pneumothorax. No evidence of pulmonary edema. Heart size is mildly enlarged. Upper mediastinal contours are within normal limits. IMPRESSION: 1. New moderate to large right pleural effusion which is likely partially loculated with extensive atelectasis and/or consolidation in the right mid to lower lung. 2. Probable  subsegmental atelectasis in the left lung base. 3. Diffuse interstitial prominence and peribronchial cuffing, concerning for an acute bronchitis. 4. Mild cardiomegaly. 5. Aortic atherosclerosis. Electronically Signed   By: Vinnie Langton M.D.   On: 01/21/2022 10:48   DG Lumbar Spine 2-3 Views  Result Date: 01/21/2022 CLINICAL DATA:  Back pain EXAM: LUMBAR SPINE - 2-3 VIEW COMPARISON:  10/11/2018 FINDINGS: No recent fracture is seen. There is previous vertebroplasty in the iliac bones on both sides. Degenerative changes are noted with bony spurs and facet hypertrophy. There is no significant disc space narrowing. Arterial calcifications are seen in the aorta and its major branches. IMPRESSION: No recent fracture is seen. Lumbar spondylosis. No significant interval changes are noted. Electronically Signed   By: Elmer Picker M.D.   On: 01/21/2022 16:48   DG Chest Port 1 View  Result Date: 01/22/2022 CLINICAL DATA:  Hemothorax, diabetes mellitus, hypertension EXAM: PORTABLE CHEST 1 VIEW COMPARISON:  Portable exam is 1019 hours compared  to 01/21/2022 FINDINGS: Enlargement of cardiac silhouette with vascular congestion. Atherosclerotic calcification aorta. Large loculated fluid collection at the lateral RIGHT chest, could represent hemothorax or loculated pleural effusion. Significant atelectasis of mid to lower RIGHT lung. Scattered infiltrates in both lungs. No pneumothorax. Rib fractures seen on prior CT exam are not well visualized radiographically. IMPRESSION: Persistent loculated collection at lateral mid to lower RIGHT hemithorax which could represent hemothorax or loculated effusion. Significant atelectasis of the adjacent mid to lower RIGHT lung with scattered infiltrates in remaining lungs bilaterally. No pneumothorax. Aortic Atherosclerosis (ICD10-I70.0). Electronically Signed   By: Lavonia Dana M.D.   On: 01/22/2022 10:27   DG Chest Port 1 View  Result Date: 01/21/2022 CLINICAL DATA:  Back  pain. Kidney disease with current UTI. Post thoracentesis. EXAM: PORTABLE CHEST 1 VIEW COMPARISON:  AP chest 01/21/2022, CT chest 02/18/2017, AP chest 12/09/2021 FINDINGS: Cardiac silhouette appears mildly enlarged. Mediastinal contours are within normal limits. Mild calcification within aortic arch. There is not a definite change in the increased density apparent represent a taller than wide loculated pleural effusion of the inferolateral right hemithorax. Heterogeneous airspace opacification within the right mid and lower lung. No definite left pleural effusion. No pneumothorax. No definite interstitial pulmonary edema. Moderate multilevel degenerative disc changes of the thoracic spine. IMPRESSION: Persistent moderate-to-large loculated right pleural effusion involving the inferolateral right hemithorax. Electronically Signed   By: Yvonne Kendall M.D.   On: 01/21/2022 16:49   CT IMAGE GUIDED FLUID DRAIN BY CATHETER  Result Date: 01/22/2022 INDICATION: Complex loculated right pleural effusion EXAM: CT-GUIDED 14 FRENCH RIGHT CHEST TUBE PLACEMENT MEDICATIONS: The patient is currently admitted to the hospital and receiving intravenous antibiotics. The antibiotics were administered within an appropriate time frame prior to the initiation of the procedure. ANESTHESIA/SEDATION: Moderate (conscious) sedation was employed during this procedure. A total of Versed 0 mg and Fentanyl 25 mcg was administered intravenously by the radiology nurse. Total intra-service moderate Sedation Time: None. The patient's level of consciousness and vital signs were monitored continuously by radiology nursing throughout the procedure under my direct supervision. COMPLICATIONS: None immediate. PROCEDURE: Informed written consent was obtained from the patient through a Spanish interpreter after a thorough discussion of the procedural risks, benefits and alternatives. All questions were addressed. Maximal Sterile Barrier Technique was  utilized including caps, mask, sterile gowns, sterile gloves, sterile drape, hand hygiene and skin antiseptic. A timeout was performed prior to the initiation of the procedure. Previous imaging reviewed. Patient positioned slightly right anterior oblique. Noncontrast imaging performed through the chest. An anterior lower intercostal space was localized and marked for access. Under sterile conditions and local anesthesia, an 18 gauge 10 cm access was advanced into the loculated right pleural effusion. Needle position confirmed with CT. Guidewire inserted followed by tract dilatation to insert a 14 French drain. Drain catheter position confirmed with CT. Syringe aspiration yielded serosanguineous blood-tinged fluid. Sample sent for culture. Catheter secured with Prolene suture and connected to external pleura vac. Sterile dressing applied. No immediate complication. Patient tolerated the procedure well. IMPRESSION: Successful CT-guided 14 French right chest tube insertion. Electronically Signed   By: Jerilynn Mages.  Shick M.D.   On: 01/22/2022 16:15   ECHOCARDIOGRAM LIMITED  Result Date: 01/23/2022    ECHOCARDIOGRAM LIMITED REPORT   Patient Name:   LUPE BONNER Date of Exam: 01/22/2022 Medical Rec #:  [id number]              Height:       65.0 in Accession #:  [id number]             Weight:       170.0 lb Date of Birth:  24-Aug-1941               BSA:          1.846 m Patient Age:    10 years               BP:           115/43 mmHg Patient Gender: M                      HR:           76 bpm. Exam Location:  [facility] Procedure: 2D Echo, Limited Echo, Limited Color Doppler and Cardiac Doppler Indications:     X90.24 Acute Diastolic CHF                   COVID +  History:         Patient has prior history of Echocardiogram examinations, most                  recent 03/27/2017. Signs/Symptoms:Chronic kidney disease; Risk                  Factors:Hypertension, Diabetes and Dyslipidemia.  Sonographer:     Cresenciano Lick  RDCS Referring Phys:  [id number] [place] Diagnosing Phys: Nelva Bush MD IMPRESSIONS  1. There is mild left ventricular hypertrophy. Left ventricular diastolic parameters are consistent with Grade I diastolic dysfunction (impaired relaxation). Elevated left atrial pressure.  2. Right ventricular systolic function is normal. The right ventricular size is normal.  3. The mitral valve is normal in structure. Trivial mitral valve regurgitation. No evidence of mitral stenosis.  4. The aortic valve is tricuspid. There is mild calcification of the aortic valve. There is mild thickening of the aortic valve. Aortic valve regurgitation is not visualized. Aortic valve sclerosis is present, with no evidence of aortic valve stenosis.  5. Pulmonic valve regurgitation not well assessed.  6. The inferior vena cava is normal in size with <50% respiratory variability, suggesting right atrial pressure of 8 mmHg. FINDINGS  Left Ventricle: The left ventricular internal cavity size was normal in size. There is mild left ventricular hypertrophy. Left ventricular diastolic parameters are consistent with Grade I diastolic dysfunction (impaired relaxation). Elevated left atrial  pressure. Right Ventricle: The right ventricular size is normal. No increase in right ventricular wall thickness. Right ventricular systolic function is normal. Pericardium: There is no evidence of pericardial effusion. Mitral Valve: The mitral valve is normal in structure. Mild mitral annular calcification. Trivial mitral valve regurgitation. No evidence of mitral valve stenosis. Tricuspid Valve: The tricuspid valve is normal in structure. Tricuspid valve regurgitation is trivial. Aortic Valve: The aortic valve is tricuspid. There is mild calcification of the aortic valve. There is mild thickening of the aortic valve. Aortic valve regurgitation is not visualized. Aortic valve sclerosis is present, with no evidence of aortic valve stenosis. Pulmonic Valve: The  pulmonic valve was not well visualized. Pulmonic valve regurgitation not well assessed. Aorta: The aortic root is normal in size and structure. Pulmonary Artery: The pulmonary artery is not well seen. Venous: The inferior vena cava is normal in size with less than 50% respiratory variability, suggesting right atrial pressure of 8 mmHg. IAS/Shunts: The interatrial septum was not well visualized. LEFT VENTRICLE PLAX 2D LVIDd:  4.70 cm Diastology LVIDs:         3.20 cm LV e' medial:    6.85 cm/s LV PW:         1.20 cm LV E/e' medial:  16.9 LV IVS:        1.20 cm LV e' lateral:   10.30 cm/s                        LV E/e' lateral: 11.3  RIGHT VENTRICLE             IVC RV S prime:     13.30 cm/s  IVC diam: 1.50 cm TAPSE (M-mode): 2.6 cm LEFT ATRIUM         Index LA diam:    5.60 cm 3.03 cm/m   AORTA Ao Root diam: 3.50 cm MITRAL VALVE MV Area (PHT): 2.87 cm MV Decel Time: 264 msec MV E velocity: 116.00 cm/s MV A velocity: 120.00 cm/s MV E/A ratio:  0.97 Harrell Gave End MD Electronically signed by Nelva Bush MD Signature Date/Time: 01/23/2022/7:14:02 AM    Final    IR THORACENTESIS ASP PLEURAL SPACE W/IMG GUIDE  Result Date: 01/21/2022 INDICATION: Patient with history of ESRD who presented to the ED with abdominal pain and difficulty urinating found to have large right loculated pleural effusion. Patient noted to have right sided rib fractures on CT abdomen/pelvis today as well. Request to IR for diagnostic right thoracentesis. EXAM: ULTRASOUND GUIDED RIGHT THORACENTESIS MEDICATIONS: 6 mL 1% lidocaine COMPLICATIONS: None immediate. PROCEDURE: An ultrasound guided thoracentesis was thoroughly discussed with the patient and questions answered. The benefits, risks, alternatives and complications were also discussed. The patient understands and wishes to proceed with the procedure. Written consent was obtained. Ultrasound was performed to localize and mark an adequate pocket of fluid in the right chest. The area  was then prepped and draped in the normal sterile fashion. 1% Lidocaine was used for local anesthesia. Under ultrasound guidance a 6 Fr Safe-T-Centesis catheter was introduced. Thoracentesis was performed. The catheter was removed and a dressing applied. FINDINGS: A total of approximately 10 mL of serosanguineous fluid was removed. Samples were sent to the laboratory as requested by the clinical team. IMPRESSION: Successful ultrasound guided right thoracentesis yielding 10 mL of pleural fluid. Appearance of the right pleural fluid on ultrasound is compatible with a posttraumatic hemothorax, which would be compatible with the patient's posterior rib fractures identified on recent CT. This would not be amenable to further image guided drainage. Consider surgical consultation for further management. Read by Candiss Norse, PA-C Electronically Signed   By: Albin Felling M.D.   On: 01/21/2022 16:19    Pleura fluid : t.prot 4.3, glucose 46, neut 94 %, amylase 20    Latest Reference Range & Units 07/18/20 12:29  Amphetamines, Ur Screen NONE DETECTED  NONE DETECTED  Barbiturates, Ur Screen NONE DETECTED  NONE DETECTED  Benzodiazepine, Ur Scrn NONE DETECTED  NONE DETECTED  Cocaine Metabolite,Ur [place] NONE DETECTED  NONE DETECTED  Methadone Scn, Ur NONE DETECTED  NONE DETECTED  MDMA (Ecstasy)Ur Screen NONE DETECTED  NONE DETECTED  Cannabinoid 50 Ng, Ur [place] NONE DETECTED  NONE DETECTED  Opiate, Ur Screen NONE DETECTED  NONE DETECTED  Phencyclidine (PCP) Ur S NONE DETECTED  NONE DETECTED  Tricyclic, Ur Screen NONE DETECTED  NONE DETECTED     ASSESSMENT/PLAN   Right pleural effusion, rib fracture,s Tiny post traumatic hemorrhagic pleural effusion. S/p  pigtail catheter placement per IR. 400  cc bloody fluid thus far. Looks exudative.  Cytology pending, no growth so far. The high neutrophils point to ? Infection. Will continue the antibiotics.   Cxr 01/22/22. Atelectasis and large remaining loculated effusion.    -Repeat cxr  -Per above consider intra pleural tpa ( relative contraindication in his case) -vs vats debridement ( will not pursue since he is DNR) -incentive spirometry -continue supportive care -did speak with his daughter  Somnolent ? Hepatic/renal  cause, infection, hypoxia, mild elevated amonia etc -renal following -lactulose -keep sats greater than 92 % -antibiotics as is   Thank you for allowing me to participate in the care of this patient.   Patient/Family are satisfied with care plan and all questions have been answered.  This document was prepared using Dragon voice recognition software and may include unintentional dictation errors.     Wallene Huh, M.D.  Division of [place]

## 2022-01-23 NOTE — Progress Notes (Signed)
OT Cancellation Note  Patient Details Name: Martin Gilbert MRN: [id number] DOB: Apr 19, 1941   Cancelled Treatment:    Reason Eval/Treat Not Completed: Other (comment). Per nursing, pt highly agitated/confused and not appropriate for OT/PT services at this time.  Will attempt to see pt at a future date/time as medically appropriate.   Fredirick Maudlin, OTR/[place]

## 2022-01-23 NOTE — Progress Notes (Signed)
PT Cancellation Note  Patient Details Name: Martin Gilbert MRN: [id number] DOB: March 12, 1941   Cancelled Treatment:    Reason Eval/Treat Not Completed: Other (comment): Per nursing pt highly agitated/confused and not appropriate for PT/OT services at this time.  Will attempt to see pt at a future date/time as medically appropriate.     Linus Salmons PT, DPT 01/23/22, 8:45 AM

## 2022-01-23 NOTE — Progress Notes (Signed)
I was informed by the nurse that patient is more somnolent, requiring supplemental oxygen with episodes of hypotension, diaphoresis.  Patient had received Haldol, Dilaudid and Ativan earlier today for agitation.  I evaluated the patient at bedside and spoke to the daughter at bedside.  Patient is currently somnolent but pupils are not pinpoint.  Blood pressure systolic is hovering around the 90s.  ABG does not show any hypercapnia.  Chest x-ray does not show florid fluid overload.  Patient is drowsy, wakes up slightly.  Currently maintaining his airway.  Discussed in detail with patient's daughter at bedside about patient's overall poor prognosis and daughter is agreeable for CODE STATUS to be changed to DNR.  We will give 1 dose of lactulose enema to see if mental status improves.  Transfer patient to progressive care unit.  Continue to monitor mental status.

## 2022-01-23 NOTE — Progress Notes (Signed)
Rapid Response Event Note   Reason for Call : Somnolence, hypotensive   Initial Focused Assessment: On my arrival pt is very somnolent and only responds minimally to pain. Pt had been very agitated earlier in shift and has gotten Haldol, dilaudid, and ativan over the shift today d/t him being so combative with staff per primary RN. Family is at bedside now. Pt's BP is 91/40 with a MAP of 56. Pt's breathing is somewhat labored and is now on 4L [place], previously on room air. All other VSS. Pt is on airborne precautions for rule out TB. CBG 73. HD patient.   Interventions: ABG, chest xray, and D50 ordered per MD. Waiting on results. BP remains soft and MD made aware. MD arrives to bedside and discussed code status with pt's daughter. Pt was made a DNR/DNI. MD reviewed ABG results. He is okay with BP where it is now at 90/48 with a MAP of 61. He wants to attempt to keep SBP >90 and wants to be notified if it drops below this.    Plan of Care: Orders received for pt to be transferred to progressive unit and to continue to monitor BP and oxygen needs. AC notified of this and made aware pt is now DNR.  Event Summary:   MD Notified: Dr. Starla Link Call Time: 9444 Arrival Time: 6190 End Time: [place], RN

## 2022-01-23 NOTE — Progress Notes (Signed)
Urology Consult Follow Up  Subjective: Patient is highly agitated, confused and combative this morning.  VSS afebrile   WBC count 11.0, Hemoglobin 8.5  and urine continues to be brown, turbid.  Urine culture returned multiple species.  Blood culture still pending.  I was able to speak with Ripley Fraise today via telephone and she states when he is at home he is alert and orientated x 3.  She informing that he has a history of a liver disease, but she could not be more specific, and that he takes a medication to help him have bowel movements.  Anti-infectives: Anti-infectives (From admission, onward)    Start     Dose/Rate Route Frequency Ordered Stop   01/22/22 1200  vancomycin (VANCOCIN) IVPB 1000 mg/200 mL premix        1,000 mg 200 mL/hr over 60 Minutes Intravenous Every T-Th-Sa (Hemodialysis) 01/21/22 1638     01/21/22 1700  piperacillin-tazobactam (ZOSYN) IVPB 2.25 g        2.25 g 100 mL/hr over 30 Minutes Intravenous Every 8 hours 01/21/22 1638     01/21/22 1330  vancomycin (VANCOCIN) IVPB 1000 mg/200 mL premix       See Hyperspace for full Linked Orders Report.   1,000 mg 200 mL/hr over 60 Minutes Intravenous  Once 01/21/22 1224 01/21/22 1703   01/21/22 1230  ceFEPIme (MAXIPIME) 2 g in sodium chloride 0.9 % 100 mL IVPB        2 g 200 mL/hr over 30 Minutes Intravenous  Once 01/21/22 1224 01/21/22 1431   01/21/22 1230  vancomycin (VANCOCIN) IVPB 1000 mg/200 mL premix       See Hyperspace for full Linked Orders Report.   1,000 mg 200 mL/hr over 60 Minutes Intravenous  Once 01/21/22 1224 01/21/22 1530       Current Facility-Administered Medications  Medication Dose Route Frequency Provider Last Rate Last Admin   0.9 %  sodium chloride infusion (Manually program via Guardrails IV Fluids)   Intravenous Once [place], Carole N, DO       0.9 %  sodium chloride infusion  250 mL Intravenous PRN Agbata, Tochukwu, MD       0.9 %  sodium chloride infusion  100 mL Intravenous PRN  Breeze, Shantelle, NP       0.9 %  sodium chloride infusion  100 mL Intravenous PRN Colon Flattery, NP       acetaminophen (TYLENOL) tablet 650 mg  650 mg Oral Q6H PRN Agbata, Tochukwu, MD   650 mg at 01/21/22 2155   allopurinol (ZYLOPRIM) tablet 50 mg  50 mg Oral Daily Agbata, Tochukwu, MD   50 mg at 01/22/22 0917   alteplase (CATHFLO ACTIVASE) injection 2 mg  2 mg Intracatheter Once PRN Colon Flattery, NP       atorvastatin (LIPITOR) tablet 20 mg  20 mg Oral Daily Agbata, Tochukwu, MD   20 mg at 01/22/22 0917   Chlorhexidine Gluconate Cloth 2 % PADS 6 each  6 each Topical Q0600 Colon Flattery, NP   6 each at 01/22/22 0549   cholecalciferol (VITAMIN D) tablet 1,000 Units  1,000 Units Oral Daily Agbata, Tochukwu, MD   1,000 Units at 01/22/22 0917   epoetin alfa (EPOGEN) injection 10,000 Units  10,000 Units Intravenous Q T,Th,Sa-HD Breeze, Benancio Deeds, NP   10,000 Units at 01/22/22 1416   famotidine (PEPCID) tablet 20 mg  20 mg Oral Daily Agbata, Tochukwu, MD   20 mg at 01/22/22 0917   ferrous sulfate tablet  325 mg  325 mg Oral BID WC Agbata, Tochukwu, MD   325 mg at 01/22/22 1757   guaiFENesin (ROBITUSSIN) 100 MG/5ML liquid 10 mL  10 mL Oral Q4H PRN Agbata, Tochukwu, MD       haloperidol lactate (HALDOL) injection 1 mg  1 mg Intravenous Q6H PRN Starla Link, Kshitiz, MD       heparin injection 1,000 Units  1,000 Units Dialysis PRN Colon Flattery, NP       heparin injection 5,000 Units  5,000 Units Subcutaneous Q8H Hall, Carole N, DO   5,000 Units at 01/23/22 0502   HYDROmorphone (DILAUDID) injection 0.5 mg  0.5 mg Intravenous Q4H PRN Irene Pap N, DO   0.5 mg at 01/23/22 0010   lidocaine (PF) (XYLOCAINE) 1 % injection 5 mL  5 mL Intradermal PRN Colon Flattery, NP       lidocaine-prilocaine (EMLA) cream 1 application  1 application Topical PRN Breeze, Shantelle, NP       LORazepam (ATIVAN) injection 1 mg  1 mg Intravenous Q6H PRN Starla Link, Kshitiz, MD       melatonin tablet 2.5 mg  2.5 mg Oral  QHS PRN Irene Pap N, DO       oxyCODONE (Oxy IR/ROXICODONE) immediate release tablet 5 mg  5 mg Oral Q6H PRN Hall, Carole N, DO       pentafluoroprop-tetrafluoroeth (GEBAUERS) aerosol 1 application  1 application Topical PRN Colon Flattery, NP       piperacillin-tazobactam (ZOSYN) IVPB 2.25 g  2.25 g Intravenous Q8H Darnelle Bos, RPH 100 mL/hr at 01/23/22 0022 2.25 g at 01/23/22 0022   prochlorperazine (COMPAZINE) injection 5 mg  5 mg Intravenous Q6H PRN Irene Pap N, DO       senna-docusate (Senokot-S) tablet 2 tablet  2 tablet Oral QHS Hall, Carole N, DO       sodium chloride flush (NS) 0.9 % injection 3 mL  3 mL Intravenous Q12H Agbata, Tochukwu, MD   3 mL at 01/23/22 0010   sodium chloride flush (NS) 0.9 % injection 3 mL  3 mL Intravenous PRN Agbata, Tochukwu, MD       tamsulosin (FLOMAX) capsule 0.4 mg  0.4 mg Oral QPC supper Agbata, Tochukwu, MD   0.4 mg at 01/22/22 1757   vancomycin (VANCOCIN) IVPB 1000 mg/200 mL premix  1,000 mg Intravenous Q T,Th,Sa-HD Darnelle Bos, RPH   Stopped at 01/22/22 1739     Objective: Vital signs in last 24 hours: Temp:  [97.8 F (36.6 C)-98.9 F (37.2 C)] 98.1 F (36.7 C) (02/08 0249) Pulse Rate:  [68-82] 72 (02/08 0249) Resp:  [16-25] 17 (02/08 0249) BP: (112-141)/(23-111) 121/45 (02/08 0249) SpO2:  [21 %-99 %] 99 % (02/08 0249) Weight:  [77.1 kg-78.1 kg] 77.1 kg (02/07 1439)  Intake/Output from previous day: 02/07 0701 - 02/08 0700 In: 690.5 [Blood:490.5; IV Piggyback:200] Out: 176 [Chest Tube:400] Intake/Output this shift: No intake/output data recorded.   Physical Exam Constitutional:  Well nourished. Alert, agitated, confused and combative Could not complete physical exam due to risk of personal injury and patient injury  Lab Results:  Recent Labs    01/22/22 0424 01/22/22 1029 01/23/22 0434  WBC 8.1  --  11.0*  HGB 7.2* 7.2* 8.5*  HCT 21.7* 21.8* 26.0*  PLT 216  --  198   BMET Recent Labs    01/22/22 0424  01/23/22 0434  NA 130* 133*  K 4.3 3.7  CL 97* 98  CO2 23 26  GLUCOSE 103*  123*  BUN 74* 39*  CREATININE 7.52* 5.17*  CALCIUM 7.8* 7.7*   PT/INR Recent Labs    01/21/22 1312  LABPROT 15.0  INR 1.2   ABG No results for input(s): PHART, HCO3 in the last 72 hours.  Invalid input(s): PCO2, PO2  Studies/Results: CT ABDOMEN PELVIS WO CONTRAST  Result Date: 01/21/2022 CLINICAL DATA:  Left lower quadrant abdominal pain with difficulty urinating for the past 3 weeks. Diarrhea. History of end-stage renal disease on hemodialysis. EXAM: CT ABDOMEN AND PELVIS WITHOUT CONTRAST TECHNIQUE: Multidetector CT imaging of the abdomen and pelvis was performed following the standard protocol without IV contrast. RADIATION DOSE REDUCTION: This exam was performed according to the departmental dose-optimization program which includes automated exposure control, adjustment of the mA and/or kV according to patient size and/or use of iterative reconstruction technique. COMPARISON:  Abdominopelvic CT 06/15/2021. Chest radiographs earlier today. FINDINGS: Lower chest: As seen on earlier radiographs, there is new moderate-sized laterally loculated right pleural effusion. There are some air bubbles posteriorly in the right pleural space (image 16/2). There is compressive atelectasis or consolidation of the right lower lobe. Streaky left lower lobe pulmonary opacities are similar to the previous study. No evidence of pneumothorax. Atherosclerosis of the aorta and coronary arteries noted. Hepatobiliary: Nodular contours of the liver again noted suspicious for early cirrhosis. No focal lesion identified without contrast. No evidence of gallstones, gallbladder wall thickening or biliary dilatation. Pancreas: Unremarkable. No pancreatic ductal dilatation or surrounding inflammatory changes. Spleen: Normal in size without focal abnormality. Adrenals/Urinary Tract: Both adrenal glands appear normal. Bilateral renal cortical  thinning with nonspecific perinephric soft tissue stranding again noted bilaterally. Small low-density renal lesions bilaterally are stable, likely cysts. No evidence of urinary tract calculus or hydronephrosis. The bladder appears grossly unremarkable. Stomach/Bowel: No enteric contrast administered. The stomach is poorly distended with possible generalized wall thickening, similar to the previous study. No evidence of bowel wall thickening, distention or surrounding inflammation. Vascular/Lymphatic: There are several mildly enlarged lymph nodes in the pelvis, including right iliac nodes measuring 8 mm on image 75/2 and 12 mm on image 81/2. There is a 10 mm left external iliac node on image 80/2. Aortic and branch vessel atherosclerosis without acute vascular findings on noncontrast imaging. Reproductive: The prostate gland is enlarged. There are multiple new areas of ill-defined low-density within the gland, measuring up to 3.1 x 2.6 cm on image 92/2. No definite surrounding inflammatory changes to suggest that this is inflammatory. Mild perirectal soft tissue stranding is similar to the previous study. Other: Mild soft tissue stranding throughout the mesenteric fat without significant ascites. No free air. Musculoskeletal: No acute or significant osseous findings. Previous bilateral iliac bone osteoplasty. IMPRESSION: 1. New ill-defined low density throughout the prostate gland which could reflect prostate cancer or prostatitis/abscess formation. Correlate with serum PSA levels. Recommend urology consultation. 2. Several mildly enlarged pelvic lymph nodes suspicious for metastatic disease. 3. No evidence of hydronephrosis or focal bladder abnormality. Stable chronic renal cortical thinning consistent with chronic renal failure. 4. As seen on earlier chest radiographs, laterally loculated right pleural effusion with air in the pleural space which may indicate empyema. Correlate clinically. Compressive right lower  lobe atelectasis or consolidation. 5.  Aortic Atherosclerosis (ICD10-I70.0). Electronically Signed   By: Richardean Sale M.D.   On: 01/21/2022 11:59   DG Chest 2 View  Result Date: 01/21/2022 CLINICAL DATA:  81 year old male with history of chest pain and cough. EXAM: CHEST - 2 VIEW COMPARISON:  Chest x-ray 12/09/2021.  FINDINGS: Diffuse interstitial prominence and widespread peribronchial cuffing again noted. New moderate to large partially loculated right pleural effusion with increasing atelectasis and/or consolidation throughout the right mid to lower lung. Probable areas of subsegmental atelectasis at the left lung base. No definite left pleural effusion. No pneumothorax. No evidence of pulmonary edema. Heart size is mildly enlarged. Upper mediastinal contours are within normal limits. IMPRESSION: 1. New moderate to large right pleural effusion which is likely partially loculated with extensive atelectasis and/or consolidation in the right mid to lower lung. 2. Probable subsegmental atelectasis in the left lung base. 3. Diffuse interstitial prominence and peribronchial cuffing, concerning for an acute bronchitis. 4. Mild cardiomegaly. 5. Aortic atherosclerosis. Electronically Signed   By: Vinnie Langton M.D.   On: 01/21/2022 10:48   DG Lumbar Spine 2-3 Views  Result Date: 01/21/2022 CLINICAL DATA:  Back pain EXAM: LUMBAR SPINE - 2-3 VIEW COMPARISON:  10/11/2018 FINDINGS: No recent fracture is seen. There is previous vertebroplasty in the iliac bones on both sides. Degenerative changes are noted with bony spurs and facet hypertrophy. There is no significant disc space narrowing. Arterial calcifications are seen in the aorta and its major branches. IMPRESSION: No recent fracture is seen. Lumbar spondylosis. No significant interval changes are noted. Electronically Signed   By: Elmer Picker M.D.   On: 01/21/2022 16:48   DG Chest Port 1 View  Result Date: 01/22/2022 CLINICAL DATA:  Hemothorax,  diabetes mellitus, hypertension EXAM: PORTABLE CHEST 1 VIEW COMPARISON:  Portable exam is 1019 hours compared to 01/21/2022 FINDINGS: Enlargement of cardiac silhouette with vascular congestion. Atherosclerotic calcification aorta. Large loculated fluid collection at the lateral RIGHT chest, could represent hemothorax or loculated pleural effusion. Significant atelectasis of mid to lower RIGHT lung. Scattered infiltrates in both lungs. No pneumothorax. Rib fractures seen on prior CT exam are not well visualized radiographically. IMPRESSION: Persistent loculated collection at lateral mid to lower RIGHT hemithorax which could represent hemothorax or loculated effusion. Significant atelectasis of the adjacent mid to lower RIGHT lung with scattered infiltrates in remaining lungs bilaterally. No pneumothorax. Aortic Atherosclerosis (ICD10-I70.0). Electronically Signed   By: Lavonia Dana M.D.   On: 01/22/2022 10:27   DG Chest Port 1 View  Result Date: 01/21/2022 CLINICAL DATA:  Back pain. Kidney disease with current UTI. Post thoracentesis. EXAM: PORTABLE CHEST 1 VIEW COMPARISON:  AP chest 01/21/2022, CT chest 02/18/2017, AP chest 12/09/2021 FINDINGS: Cardiac silhouette appears mildly enlarged. Mediastinal contours are within normal limits. Mild calcification within aortic arch. There is not a definite change in the increased density apparent represent a taller than wide loculated pleural effusion of the inferolateral right hemithorax. Heterogeneous airspace opacification within the right mid and lower lung. No definite left pleural effusion. No pneumothorax. No definite interstitial pulmonary edema. Moderate multilevel degenerative disc changes of the thoracic spine. IMPRESSION: Persistent moderate-to-large loculated right pleural effusion involving the inferolateral right hemithorax. Electronically Signed   By: Yvonne Kendall M.D.   On: 01/21/2022 16:49   CT IMAGE GUIDED FLUID DRAIN BY CATHETER  Result Date:  01/22/2022 INDICATION: Complex loculated right pleural effusion EXAM: CT-GUIDED 14 FRENCH RIGHT CHEST TUBE PLACEMENT MEDICATIONS: The patient is currently admitted to the hospital and receiving intravenous antibiotics. The antibiotics were administered within an appropriate time frame prior to the initiation of the procedure. ANESTHESIA/SEDATION: Moderate (conscious) sedation was employed during this procedure. A total of Versed 0 mg and Fentanyl 25 mcg was administered intravenously by the radiology nurse. Total intra-service moderate Sedation Time: None. The patient's  level of consciousness and vital signs were monitored continuously by radiology nursing throughout the procedure under my direct supervision. COMPLICATIONS: None immediate. PROCEDURE: Informed written consent was obtained from the patient through a Spanish interpreter after a thorough discussion of the procedural risks, benefits and alternatives. All questions were addressed. Maximal Sterile Barrier Technique was utilized including caps, mask, sterile gowns, sterile gloves, sterile drape, hand hygiene and skin antiseptic. A timeout was performed prior to the initiation of the procedure. Previous imaging reviewed. Patient positioned slightly right anterior oblique. Noncontrast imaging performed through the chest. An anterior lower intercostal space was localized and marked for access. Under sterile conditions and local anesthesia, an 18 gauge 10 cm access was advanced into the loculated right pleural effusion. Needle position confirmed with CT. Guidewire inserted followed by tract dilatation to insert a 14 French drain. Drain catheter position confirmed with CT. Syringe aspiration yielded serosanguineous blood-tinged fluid. Sample sent for culture. Catheter secured with Prolene suture and connected to external pleura vac. Sterile dressing applied. No immediate complication. Patient tolerated the procedure well. IMPRESSION: Successful CT-guided 14  French right chest tube insertion. Electronically Signed   By: Jerilynn Mages.  Shick M.D.   On: 01/22/2022 16:15   ECHOCARDIOGRAM LIMITED  Result Date: 01/23/2022    ECHOCARDIOGRAM LIMITED REPORT   Patient Name:   NATTHEW MARLATT Date of Exam: 01/22/2022 Medical Rec #:  [id number]              Height:       65.0 in Accession #:    [id number]             Weight:       170.0 lb Date of Birth:  05-12-1941               BSA:          1.846 m Patient Age:    81 years               BP:           115/43 mmHg Patient Gender: M                      HR:           76 bpm. Exam Location:  [facility] Procedure: 2D Echo, Limited Echo, Limited Color Doppler and Cardiac Doppler Indications:     M42.68 Acute Diastolic CHF                   COVID +  History:         Patient has prior history of Echocardiogram examinations, most                  recent 03/27/2017. Signs/Symptoms:Chronic kidney disease; Risk                  Factors:Hypertension, Diabetes and Dyslipidemia.  Sonographer:     Cresenciano Lick RDCS Referring Phys:  [id number] [place] Diagnosing Phys: Nelva Bush MD IMPRESSIONS  1. There is mild left ventricular hypertrophy. Left ventricular diastolic parameters are consistent with Grade I diastolic dysfunction (impaired relaxation). Elevated left atrial pressure.  2. Right ventricular systolic function is normal. The right ventricular size is normal.  3. The mitral valve is normal in structure. Trivial mitral valve regurgitation. No evidence of mitral stenosis.  4. The aortic valve is tricuspid. There is mild calcification of the aortic valve. There is mild thickening of the aortic valve. Aortic valve regurgitation is  not visualized. Aortic valve sclerosis is present, with no evidence of aortic valve stenosis.  5. Pulmonic valve regurgitation not well assessed.  6. The inferior vena cava is normal in size with <50% respiratory variability, suggesting right atrial pressure of 8 mmHg. FINDINGS  Left Ventricle: The left  ventricular internal cavity size was normal in size. There is mild left ventricular hypertrophy. Left ventricular diastolic parameters are consistent with Grade I diastolic dysfunction (impaired relaxation). Elevated left atrial  pressure. Right Ventricle: The right ventricular size is normal. No increase in right ventricular wall thickness. Right ventricular systolic function is normal. Pericardium: There is no evidence of pericardial effusion. Mitral Valve: The mitral valve is normal in structure. Mild mitral annular calcification. Trivial mitral valve regurgitation. No evidence of mitral valve stenosis. Tricuspid Valve: The tricuspid valve is normal in structure. Tricuspid valve regurgitation is trivial. Aortic Valve: The aortic valve is tricuspid. There is mild calcification of the aortic valve. There is mild thickening of the aortic valve. Aortic valve regurgitation is not visualized. Aortic valve sclerosis is present, with no evidence of aortic valve stenosis. Pulmonic Valve: The pulmonic valve was not well visualized. Pulmonic valve regurgitation not well assessed. Aorta: The aortic root is normal in size and structure. Pulmonary Artery: The pulmonary artery is not well seen. Venous: The inferior vena cava is normal in size with less than 50% respiratory variability, suggesting right atrial pressure of 8 mmHg. IAS/Shunts: The interatrial septum was not well visualized. LEFT VENTRICLE PLAX 2D LVIDd:         4.70 cm Diastology LVIDs:         3.20 cm LV e' medial:    6.85 cm/s LV PW:         1.20 cm LV E/e' medial:  16.9 LV IVS:        1.20 cm LV e' lateral:   10.30 cm/s                        LV E/e' lateral: 11.3  RIGHT VENTRICLE             IVC RV S prime:     13.30 cm/s  IVC diam: 1.50 cm TAPSE (M-mode): 2.6 cm LEFT ATRIUM         Index LA diam:    5.60 cm 3.03 cm/m   AORTA Ao Root diam: 3.50 cm MITRAL VALVE MV Area (PHT): 2.87 cm MV Decel Time: 264 msec MV E velocity: 116.00 cm/s MV A velocity: 120.00  cm/s MV E/A ratio:  0.97 Harrell Gave End MD Electronically signed by Nelva Bush MD Signature Date/Time: 01/23/2022/7:14:02 AM    Final    IR THORACENTESIS ASP PLEURAL SPACE W/IMG GUIDE  Result Date: 01/21/2022 INDICATION: Patient with history of ESRD who presented to the ED with abdominal pain and difficulty urinating found to have large right loculated pleural effusion. Patient noted to have right sided rib fractures on CT abdomen/pelvis today as well. Request to IR for diagnostic right thoracentesis. EXAM: ULTRASOUND GUIDED RIGHT THORACENTESIS MEDICATIONS: 6 mL 1% lidocaine COMPLICATIONS: None immediate. PROCEDURE: An ultrasound guided thoracentesis was thoroughly discussed with the patient and questions answered. The benefits, risks, alternatives and complications were also discussed. The patient understands and wishes to proceed with the procedure. Written consent was obtained. Ultrasound was performed to localize and mark an adequate pocket of fluid in the right chest. The area was then prepped and draped in the normal sterile fashion. 1% Lidocaine was used for  local anesthesia. Under ultrasound guidance a 6 Fr Safe-T-Centesis catheter was introduced. Thoracentesis was performed. The catheter was removed and a dressing applied. FINDINGS: A total of approximately 10 mL of serosanguineous fluid was removed. Samples were sent to the laboratory as requested by the clinical team. IMPRESSION: Successful ultrasound guided right thoracentesis yielding 10 mL of pleural fluid. Appearance of the right pleural fluid on ultrasound is compatible with a posttraumatic hemothorax, which would be compatible with the patient's posterior rib fractures identified on recent CT. This would not be amenable to further image guided drainage. Consider surgical consultation for further management. Read by Candiss Norse, PA-C Electronically Signed   By: Albin Felling M.D.   On: 01/21/2022 16:19     Assessment: 81 year old  male with ESRD on dialysis who presented to the emergency room with a complaint of pelvic pain and decreased urinary output.  Noncontrast CT noted a low-density area in the right anterior gland of the prostate concerning for prostatitis versus a prostate abscess formation.  After speaking with his daughter, it may be that his current state of confusion, agitation and combativeness is due to hepatic encephalopathy versus a worsening of his prostate infection as his vitals appear to be stable.    Plan: -Lactulose will be started today by hospital service -Ammonia level pending -Continue to follow cultures -Continue broad-spectrum antibiotics -Continue to trend CBC -If he complains of increased pelvic pain, experiences high fevers or significant increase in WBC counts or other signs of deterioration, we will need to repeat imaging during this admission otherwise he will need eventual outpatient follow-up imaging after discharge     LOS: 2 days    [facility] 01/23/2022

## 2022-01-23 NOTE — Progress Notes (Addendum)
Pt to transfer to [id number] per MD order. Report called to Claiborne Billings, RN on 2A. 250 cc bolus started. Belongings sent with pt. Daughter Angie at bedside.

## 2022-01-23 NOTE — Progress Notes (Addendum)
PROGRESS NOTE    Mihcael Ledee  WEX:[id number] DOB: [birth year] DOA: 01/21/2022 PCP: [name], MD   Brief Narrative:  81 y.o. male with medical history significant of ESRD (still makes urine), hypertension, diabetes mellitus type 2, anemia of chronic disease, BPH, most recent hospitalization in December 2022 for acute pulmonary edema secondary to acute on chronic diastolic CHF presented with lower abdominal pain and difficulty voiding for about 3 weeks.  Work-up in the ED revealed urinary tract infection, loculated right-sided pleural effusion with concerns for posttraumatic hemothorax in the setting of posterior rib fractures identified on recent CT scan.  He was started on broad-spectrum antibiotics.  He underwent diagnostic thoracentesis by interventional radiology.  Cardiothoracic surgery was consulted who recommended pigtail catheter placement.  Pulmonary was consulted.  Patient underwent Right chest tube insertion by IR on 01/22/2022.  Urology and nephrology where also consulted.  Assessment & Plan:   Complicated UTI with acute urinary retention Sepsis: Present on admission -Patient apparently still makes urine. -Foley catheter placed in the ED -Imaging showed ill-defined low-density throughout the prostate gland which could reflect prostate cancer or prostatitis/abscess formation. Urology following: Recommended conservative treatment with antibiotics.  Urine culture grew multiple species.  Will reorder urine culture.  Blood cultures negative so far.  Moderate to large right-sided pleural effusion, partially loculated Posttraumatic hemothorax in the setting of posterior rib fractures identified on recent CT scan -Status post thoracentesis with 10 cc of serosanguineous fluid removal.  Pleural fluid cultures negative so far. -TB being ruled out as per pulmonary recommendations.  Continue airborne isolation. -CT surgery recommended pigtail catheter placement.  Status post  right-sided chest tube insertion by IR on 01/22/2022.  Pulmonary following.  End-stage renal disease on hemodialysis -Nephrology following.  Dialysis as per nephrology schedule  Acute metabolic encephalopathy/delirium -Patient agitated this morning possibly from above.  Delirium precautions.  Fall precautions.  Use Haldol/Ativan as needed -Patient might have hepatic encephalopathy as well as patient takes lactulose at home.  Will resume lactulose.  Acute on chronic diastolic CHF -Volume being managed by nephrology by dialysis.  Limited echo showed grade 1 diastolic dysfunction -Strict input and output.  Daily weights.  Fluid restriction.  Leukocytosis -Monitor  Hypoalbuminemia -Follow nutrition recommendations  Elevated troponin -Possibly from demand ischemia in the setting of moderate to large pleural effusion.  Troponin peaked at 55 and trended down.  No anginal symptoms.  Limited echo as above.  No further work-up needed.  Hyponatremia -Managed by nephrology by dialysis  Possible cirrhosis of liver -As noted in prior imaging.  Resume lactulose.  Outpatient follow-up with GI  Anemia of chronic disease/acute blood loss anemia in the setting of hemothorax -Status post 1 unit packed red cell transfusion on 283.  Hemoglobin 8.5 today  Generalized deconditioning -PT/OT eval. -Palliative care consultation for goals of care discussion.  Currently status full code.  DVT prophylaxis: Subcutaneous heparin Code Status: Full Family Communication: Daughter at bedside Disposition Plan: Status is: Inpatient  Remains inpatient appropriate because: Of severe active illness  Consultants: Nephrology/urology/pulmonary/IR.  Case discussed with cardiothoracic surgery as well on phone by prior hospitalist.  Procedures: Limited echo. Right thoracentesis Right sided chest tube placement  Antimicrobials:  Anti-infectives (From admission, onward)    Start     Dose/Rate Route Frequency  Ordered Stop   01/22/22 1200  vancomycin (VANCOCIN) IVPB 1000 mg/200 mL premix        1,000 mg 200 mL/hr over 60 Minutes Intravenous Every T-Th-Sa (Hemodialysis) 01/21/22 1638  01/21/22 1700  piperacillin-tazobactam (ZOSYN) IVPB 2.25 g        2.25 g 100 mL/hr over 30 Minutes Intravenous Every 8 hours 01/21/22 1638     01/21/22 1330  vancomycin (VANCOCIN) IVPB 1000 mg/200 mL premix       See Hyperspace for full Linked Orders Report.   1,000 mg 200 mL/hr over 60 Minutes Intravenous  Once 01/21/22 1224 01/21/22 1703   01/21/22 1230  ceFEPIme (MAXIPIME) 2 g in sodium chloride 0.9 % 100 mL IVPB        2 g 200 mL/hr over 30 Minutes Intravenous  Once 01/21/22 1224 01/21/22 1431   01/21/22 1230  vancomycin (VANCOCIN) IVPB 1000 mg/200 mL premix       See Hyperspace for full Linked Orders Report.   1,000 mg 200 mL/hr over 60 Minutes Intravenous  Once 01/21/22 1224 01/21/22 1530        Subjective: Patient seen and examined at bedside.  Poor historian.  Nursing staff reports intermittent agitation earlier this morning.  Daughter present at bedside.  No overnight fever, vomiting, seizures reported.  Objective: Vitals:   01/23/22 0249 01/23/22 0934 01/23/22 1103 01/23/22 1128  BP: (!) 121/45 (!) 115/50  134/66  Pulse: 72 70  (!) 102  Resp: 17 17    Temp: 98.1 F (36.7 C) 98.2 F (36.8 C) (!) 97.4 F (36.3 C)   TempSrc:  Oral Oral   SpO2: 99% 100%  97%  Weight:      Height:        Intake/Output Summary (Last 24 hours) at 01/23/2022 1224 Last data filed at 01/23/2022 0422 Gross per 24 hour  Intake 690.52 ml  Output 633 ml  Net 57.52 ml   Filed Weights   01/21/22 0938 01/22/22 1003 01/22/22 1439  Weight: 90.7 kg 78.1 kg 77.1 kg    Examination:  General exam: Appears calm and comfortable.  Currently on room air.  Looks chronically ill and deconditioned. Respiratory system: Bilateral decreased breath sounds at bases with scattered crackles Cardiovascular system: S1 & S2 heard,  Rate controlled Gastrointestinal system: Abdomen is nondistended, soft and nontender. Normal bowel sounds heard. Extremities: No cyanosis, clubbing; trace lower extremity edema Central nervous system: Awake, slow to respond, poor historian.  No focal neurological deficits. Moving extremities Skin: No rashes, lesions or ulcers Psychiatry: Affect is mostly flat.  Does not participate in conversation much.   Data Reviewed: I have personally reviewed following labs and imaging studies  CBC: Recent Labs  Lab 01/21/22 1025 01/22/22 0424 01/22/22 1029 01/23/22 0434  WBC 12.1* 8.1  --  11.0*  NEUTROABS 10.2*  --   --   --   HGB 7.7* 7.2* 7.2* 8.5*  HCT 23.8* 21.7* 21.8* 26.0*  MCV 97.9 94.8  --  94.2  PLT 226 216  --  382   Basic Metabolic Panel: Recent Labs  Lab 01/21/22 1025 01/22/22 0424 01/23/22 0434  NA 130* 130* 133*  K 3.9 4.3 3.7  CL 96* 97* 98  CO2 24 23 26   GLUCOSE 121* 103* 123*  BUN 69* 74* 39*  CREATININE 6.52* 7.52* 5.17*  CALCIUM 7.9* 7.8* 7.7*  PHOS  --   --  2.7   GFR: Estimated Creatinine Clearance: 10.7 mL/min (A) (by C-G formula based on SCr of 5.17 mg/dL (H)). Liver Function Tests: Recent Labs  Lab 01/21/22 1025 01/23/22 0434  AST 52*  --   ALT 20  --   ALKPHOS 443*  --   BILITOT  1.6*  --   PROT 6.3*  --   ALBUMIN 1.9* 1.8*   Recent Labs  Lab 01/21/22 1025  LIPASE 24   Recent Labs  Lab 01/23/22 0956  AMMONIA 37*   Coagulation Profile: Recent Labs  Lab 01/21/22 1312  INR 1.2   Cardiac Enzymes: No results for input(s): CKTOTAL, CKMB, CKMBINDEX, TROPONINI in the last 168 hours. BNP (last 3 results) No results for input(s): PROBNP in the last 8760 hours. HbA1C: No results for input(s): HGBA1C in the last 72 hours. CBG: Recent Labs  Lab 01/22/22 1801 01/23/22 0029 01/23/22 0455 01/23/22 0935 01/23/22 1157  GLUCAP 172* 117* 107* 108* 119*   Lipid Profile: No results for input(s): CHOL, HDL, LDLCALC, TRIG, CHOLHDL, LDLDIRECT  in the last 72 hours. Thyroid Function Tests: No results for input(s): TSH, T4TOTAL, FREET4, T3FREE, THYROIDAB in the last 72 hours. Anemia Panel: Recent Labs    01/23/22 0434  FERRITIN 367*  TIBC 132*  IRON 18*  RETICCTPCT 1.9   Sepsis Labs: Recent Labs  Lab 01/21/22 1312  LATICACIDVEN 1.0    Recent Results (from the past 240 hour(s))  Urine Culture     Status: Abnormal   Collection Time: 01/21/22 10:25 AM   Specimen: Urine, Random  Result Value Ref Range Status   Specimen Description   Final    URINE, RANDOM Performed at [facility], [address] [place], [place] [address]    Special Requests   Final    NONE Performed at [facility], [address] [place], [place] [address]    Culture MULTIPLE SPECIES PRESENT, SUGGEST RECOLLECTION (A)  Final   Report Status 01/23/2022 FINAL  Final  Blood Culture (routine x 2)     Status: None (Preliminary result)   Collection Time: 01/21/22  1:12 PM   Specimen: BLOOD  Result Value Ref Range Status   Specimen Description BLOOD BLOOD RIGHT ARM  Final   Special Requests   Final    BOTTLES DRAWN AEROBIC AND ANAEROBIC Blood Culture adequate volume   Culture   Final    NO GROWTH 2 DAYS Performed at [facility], [address], [place], [place] [address]    Report Status PENDING  Incomplete  Blood Culture (routine x 2)     Status: None (Preliminary result)   Collection Time: 01/21/22  1:12 PM   Specimen: BLOOD  Result Value Ref Range Status   Specimen Description BLOOD BLOOD RIGHT FOREARM  Final   Special Requests   Final    BOTTLES DRAWN AEROBIC AND ANAEROBIC Blood Culture adequate volume   Culture   Final    NO GROWTH 2 DAYS Performed at [facility], [address] [place], [place] [address]    Report Status PENDING  Incomplete  Body fluid culture w Gram Stain     Status: None (Preliminary result)   Collection Time: 01/21/22  4:02 PM   Specimen: Chest; Body Fluid  Result Value  Ref Range Status   Specimen Description   Final    CHEST FLUID Performed at [facility], [place] [address], [place], [place] [address]    Special Requests   Final    NONE Performed at [facility], [place], [place] [address]    Gram Stain NO WBC SEEN NO ORGANISMS SEEN   Final   Culture   Final    FEW GRAM NEGATIVE RODS CULTURE REINCUBATED FOR BETTER GROWTH Performed at [facility],  [address] [place], [place] [address]    Report Status PENDING  Incomplete  Aerobic/Anaerobic Culture w Gram Stain (surgical/deep wound)     Status: None (Preliminary result)   Collection Time: 01/22/22  3:56 PM   Specimen: Pleural Fluid  Result Value Ref Range Status   Specimen Description   Final    PLEURAL RIGHT Performed at [facility], [address] [place], [place] [address]    Special Requests   Final    PLE FLU Performed at [facility], [address] [place], [place] [address]    Gram Stain   Final    RARE WBC PRESENT, PREDOMINANTLY MONONUCLEAR NO ORGANISMS SEEN    Culture   Final    NO GROWTH < 12 HOURS Performed at [facility], [place] [address] [place], [place] [address]    Report Status PENDING  Incomplete         Radiology Studies: DG Lumbar Spine 2-3 Views  Result Date: 01/21/2022 CLINICAL DATA:  Back pain EXAM: LUMBAR SPINE - 2-3 VIEW COMPARISON:  10/11/2018 FINDINGS: No recent fracture is seen. There is previous vertebroplasty in the iliac bones on both sides. Degenerative changes are noted with bony spurs and facet hypertrophy. There is no significant disc space narrowing. Arterial calcifications are seen in the aorta and its major branches. IMPRESSION: No recent fracture is seen. Lumbar spondylosis. No significant interval changes are noted. Electronically Signed   By: Elmer Picker M.D.   On: 01/21/2022 16:48   DG Chest Port 1 View  Result Date: 01/22/2022 CLINICAL DATA:  Hemothorax, diabetes  mellitus, hypertension EXAM: PORTABLE CHEST 1 VIEW COMPARISON:  Portable exam is 1019 hours compared to 01/21/2022 FINDINGS: Enlargement of cardiac silhouette with vascular congestion. Atherosclerotic calcification aorta. Large loculated fluid collection at the lateral RIGHT chest, could represent hemothorax or loculated pleural effusion. Significant atelectasis of mid to lower RIGHT lung. Scattered infiltrates in both lungs. No pneumothorax. Rib fractures seen on prior CT exam are not well visualized radiographically. IMPRESSION: Persistent loculated collection at lateral mid to lower RIGHT hemithorax which could represent hemothorax or loculated effusion. Significant atelectasis of the adjacent mid to lower RIGHT lung with scattered infiltrates in remaining lungs bilaterally. No pneumothorax. Aortic Atherosclerosis (ICD10-I70.0). Electronically Signed   By: Lavonia Dana M.D.   On: 01/22/2022 10:27   DG Chest Port 1 View  Result Date: 01/21/2022 CLINICAL DATA:  Back pain. Kidney disease with current UTI. Post thoracentesis. EXAM: PORTABLE CHEST 1 VIEW COMPARISON:  AP chest 01/21/2022, CT chest 02/18/2017, AP chest 12/09/2021 FINDINGS: Cardiac silhouette appears mildly enlarged. Mediastinal contours are within normal limits. Mild calcification within aortic arch. There is not a definite change in the increased density apparent represent a taller than wide loculated pleural effusion of the inferolateral right hemithorax. Heterogeneous airspace opacification within the right mid and lower lung. No definite left pleural effusion. No pneumothorax. No definite interstitial pulmonary edema. Moderate multilevel degenerative disc changes of the thoracic spine. IMPRESSION: Persistent moderate-to-large loculated right pleural effusion involving the inferolateral right hemithorax. Electronically Signed   By: Yvonne Kendall M.D.   On: 01/21/2022 16:49   CT IMAGE GUIDED FLUID DRAIN BY CATHETER  Result Date:  01/22/2022 INDICATION: Complex loculated right pleural effusion EXAM: CT-GUIDED 14 FRENCH RIGHT CHEST TUBE PLACEMENT MEDICATIONS: The patient is currently admitted to the hospital and receiving intravenous antibiotics. The antibiotics were administered within an appropriate time frame prior to the initiation of the procedure. ANESTHESIA/SEDATION: Moderate (conscious) sedation  was employed during this procedure. A total of Versed 0 mg and Fentanyl 25 mcg was administered intravenously by the radiology nurse. Total intra-service moderate Sedation Time: None. The patient's level of consciousness and vital signs were monitored continuously by radiology nursing throughout the procedure under my direct supervision. COMPLICATIONS: None immediate. PROCEDURE: Informed written consent was obtained from the patient through a Spanish interpreter after a thorough discussion of the procedural risks, benefits and alternatives. All questions were addressed. Maximal Sterile Barrier Technique was utilized including caps, mask, sterile gowns, sterile gloves, sterile drape, hand hygiene and skin antiseptic. A timeout was performed prior to the initiation of the procedure. Previous imaging reviewed. Patient positioned slightly right anterior oblique. Noncontrast imaging performed through the chest. An anterior lower intercostal space was localized and marked for access. Under sterile conditions and local anesthesia, an 18 gauge 10 cm access was advanced into the loculated right pleural effusion. Needle position confirmed with CT. Guidewire inserted followed by tract dilatation to insert a 14 French drain. Drain catheter position confirmed with CT. Syringe aspiration yielded serosanguineous blood-tinged fluid. Sample sent for culture. Catheter secured with Prolene suture and connected to external pleura vac. Sterile dressing applied. No immediate complication. Patient tolerated the procedure well. IMPRESSION: Successful CT-guided 14  French right chest tube insertion. Electronically Signed   By: Jerilynn Mages.  Shick M.D.   On: 01/22/2022 16:15   ECHOCARDIOGRAM LIMITED  Result Date: 01/23/2022    ECHOCARDIOGRAM LIMITED REPORT   Patient Name:   ELDRIDGE MARCOTT Date of Exam: 01/22/2022 Medical Rec #:  [id number]              Height:       65.0 in Accession #:    [id number]             Weight:       170.0 lb Date of Birth:  28-Nov-1941               BSA:          1.846 m Patient Age:    67 years               BP:           115/43 mmHg Patient Gender: M                      HR:           76 bpm. Exam Location:  [facility] Procedure: 2D Echo, Limited Echo, Limited Color Doppler and Cardiac Doppler Indications:     B09.62 Acute Diastolic CHF                   COVID +  History:         Patient has prior history of Echocardiogram examinations, most                  recent 03/27/2017. Signs/Symptoms:Chronic kidney disease; Risk                  Factors:Hypertension, Diabetes and Dyslipidemia.  Sonographer:     Cresenciano Lick RDCS Referring Phys:  [id number] [place] Diagnosing Phys: Nelva Bush MD IMPRESSIONS  1. There is mild left ventricular hypertrophy. Left ventricular diastolic parameters are consistent with Grade I diastolic dysfunction (impaired relaxation). Elevated left atrial pressure.  2. Right ventricular systolic function is normal. The right ventricular size is normal.  3. The mitral valve is normal in structure. Trivial mitral valve regurgitation. No evidence  of mitral stenosis.  4. The aortic valve is tricuspid. There is mild calcification of the aortic valve. There is mild thickening of the aortic valve. Aortic valve regurgitation is not visualized. Aortic valve sclerosis is present, with no evidence of aortic valve stenosis.  5. Pulmonic valve regurgitation not well assessed.  6. The inferior vena cava is normal in size with <50% respiratory variability, suggesting right atrial pressure of 8 mmHg. FINDINGS  Left Ventricle: The left  ventricular internal cavity size was normal in size. There is mild left ventricular hypertrophy. Left ventricular diastolic parameters are consistent with Grade I diastolic dysfunction (impaired relaxation). Elevated left atrial  pressure. Right Ventricle: The right ventricular size is normal. No increase in right ventricular wall thickness. Right ventricular systolic function is normal. Pericardium: There is no evidence of pericardial effusion. Mitral Valve: The mitral valve is normal in structure. Mild mitral annular calcification. Trivial mitral valve regurgitation. No evidence of mitral valve stenosis. Tricuspid Valve: The tricuspid valve is normal in structure. Tricuspid valve regurgitation is trivial. Aortic Valve: The aortic valve is tricuspid. There is mild calcification of the aortic valve. There is mild thickening of the aortic valve. Aortic valve regurgitation is not visualized. Aortic valve sclerosis is present, with no evidence of aortic valve stenosis. Pulmonic Valve: The pulmonic valve was not well visualized. Pulmonic valve regurgitation not well assessed. Aorta: The aortic root is normal in size and structure. Pulmonary Artery: The pulmonary artery is not well seen. Venous: The inferior vena cava is normal in size with less than 50% respiratory variability, suggesting right atrial pressure of 8 mmHg. IAS/Shunts: The interatrial septum was not well visualized. LEFT VENTRICLE PLAX 2D LVIDd:         4.70 cm Diastology LVIDs:         3.20 cm LV e' medial:    6.85 cm/s LV PW:         1.20 cm LV E/e' medial:  16.9 LV IVS:        1.20 cm LV e' lateral:   10.30 cm/s                        LV E/e' lateral: 11.3  RIGHT VENTRICLE             IVC RV S prime:     13.30 cm/s  IVC diam: 1.50 cm TAPSE (M-mode): 2.6 cm LEFT ATRIUM         Index LA diam:    5.60 cm 3.03 cm/m   AORTA Ao Root diam: 3.50 cm MITRAL VALVE MV Area (PHT): 2.87 cm MV Decel Time: 264 msec MV E velocity: 116.00 cm/s MV A velocity: 120.00  cm/s MV E/A ratio:  0.97 Harrell Gave End MD Electronically signed by Nelva Bush MD Signature Date/Time: 01/23/2022/7:14:02 AM    Final    IR THORACENTESIS ASP PLEURAL SPACE W/IMG GUIDE  Result Date: 01/21/2022 INDICATION: Patient with history of ESRD who presented to the ED with abdominal pain and difficulty urinating found to have large right loculated pleural effusion. Patient noted to have right sided rib fractures on CT abdomen/pelvis today as well. Request to IR for diagnostic right thoracentesis. EXAM: ULTRASOUND GUIDED RIGHT THORACENTESIS MEDICATIONS: 6 mL 1% lidocaine COMPLICATIONS: None immediate. PROCEDURE: An ultrasound guided thoracentesis was thoroughly discussed with the patient and questions answered. The benefits, risks, alternatives and complications were also discussed. The patient understands and wishes to proceed with the procedure. Written consent was obtained. Ultrasound was performed  to localize and mark an adequate pocket of fluid in the right chest. The area was then prepped and draped in the normal sterile fashion. 1% Lidocaine was used for local anesthesia. Under ultrasound guidance a 6 Fr Safe-T-Centesis catheter was introduced. Thoracentesis was performed. The catheter was removed and a dressing applied. FINDINGS: A total of approximately 10 mL of serosanguineous fluid was removed. Samples were sent to the laboratory as requested by the clinical team. IMPRESSION: Successful ultrasound guided right thoracentesis yielding 10 mL of pleural fluid. Appearance of the right pleural fluid on ultrasound is compatible with a posttraumatic hemothorax, which would be compatible with the patient's posterior rib fractures identified on recent CT. This would not be amenable to further image guided drainage. Consider surgical consultation for further management. Read by Candiss Norse, PA-C Electronically Signed   By: Albin Felling M.D.   On: 01/21/2022 16:19        Scheduled Meds:   sodium chloride   Intravenous Once   allopurinol  50 mg Oral Daily   atorvastatin  20 mg Oral Daily   Chlorhexidine Gluconate Cloth  6 each Topical Q0600   cholecalciferol  1,000 Units Oral Daily   epoetin (EPOGEN/PROCRIT) injection  10,000 Units Intravenous Q T,Th,Sa-HD   famotidine  20 mg Oral Daily   ferrous sulfate  325 mg Oral BID WC   heparin injection (subcutaneous)  5,000 Units Subcutaneous Q8H   lactulose  20 g Oral TID   senna-docusate  2 tablet Oral QHS   sodium chloride flush  3 mL Intravenous Q12H   tamsulosin  0.4 mg Oral QPC supper   Continuous Infusions:  sodium chloride     sodium chloride     sodium chloride     piperacillin-tazobactam (ZOSYN)  IV 2.25 g (01/23/22 0949)   vancomycin Stopped (01/22/22 1739)          Aline August, MD [facility] 01/23/2022, 12:24 PM
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
## 2022-01-23 NOTE — Progress Notes (Addendum)
[facility]  ROUNDING NOTE   Subjective:   [name] was admitted to [facility] on 01/21/2022 for Urinary retention [R33.9] Back pain [M54.9] UTI (urinary tract infection) [N39.0] Pleural effusion [J90] Pleural effusion on right [J90] Acute cystitis with hematuria [N30.01] Chest pain, unspecified type [R07.9] Sepsis, due to unspecified organism, unspecified whether acute organ dysfunction present [facility]) [A41.9] Acute cough [R05.1]  Last hemodialysis treatment was Saturday, February 4. Left at 72.5kg - below his dry weight.  History is difficult to get even with [place] Interpreter.   Patient resting quietly Per nursing, patient was combative this morning  Chest tube remains in place Continues to complains of right sided abdominal pain   Objective:  Vital signs in last 24 hours:  Temp:  [97.4 F (36.3 C)-98.9 F (37.2 C)] 97.4 F (36.3 C) (02/08 1103) Pulse Rate:  [68-102] 102 (02/08 1128) Resp:  [16-23] 17 (02/08 0934) BP: (115-141)/(23-111) 134/66 (02/08 1128) SpO2:  [21 %-100 %] 97 % (02/08 1128) Weight:  [77.1 kg] 77.1 kg (02/07 1439)  Weight change: -12.6 kg Filed Weights   01/21/22 0938 01/22/22 1003 01/22/22 1439  Weight: 90.7 kg 78.1 kg 77.1 kg    Intake/Output: I/O last 3 completed shifts: In: 773.7 [Blood:490.5; IV Piggyback:283.2] Out: 098 [Other:233; Chest Tube:400]   Intake/Output this shift:  No intake/output data recorded.  Physical Exam: General: NAD,resting in bed  Head: Normocephalic, atraumatic. Moist oral mucosal membranes  Eyes: Anicteric  Lungs:  Basilar rales, normal effort, cough, Right sided Chest tube  Heart: Regular rate and rhythm  Abdomen:  Suprapubic tenderness  Extremities:  No peripheral edema.  Neurologic: Nonfocal, moving all four extremities  Skin: No lesions  Access: Left AVF    Basic Metabolic Panel: Recent Labs  Lab 01/21/22 1025 01/22/22 0424 01/23/22 0434  NA 130* 130* 133*  K 3.9 4.3  3.7  CL 96* 97* 98  CO2 24 23 26   GLUCOSE 121* 103* 123*  BUN 69* 74* 39*  CREATININE 6.52* 7.52* 5.17*  CALCIUM 7.9* 7.8* 7.7*  PHOS  --   --  2.7     Liver Function Tests: Recent Labs  Lab 01/21/22 1025 01/23/22 0434  AST 52*  --   ALT 20  --   ALKPHOS 443*  --   BILITOT 1.6*  --   PROT 6.3*  --   ALBUMIN 1.9* 1.8*    Recent Labs  Lab 01/21/22 1025  LIPASE 24    Recent Labs  Lab 01/23/22 0956  AMMONIA 37*    CBC: Recent Labs  Lab 01/21/22 1025 01/22/22 0424 01/22/22 1029 01/23/22 0434  WBC 12.1* 8.1  --  11.0*  NEUTROABS 10.2*  --   --   --   HGB 7.7* 7.2* 7.2* 8.5*  HCT 23.8* 21.7* 21.8* 26.0*  MCV 97.9 94.8  --  94.2  PLT 226 216  --  198     Cardiac Enzymes: No results for input(s): CKTOTAL, CKMB, CKMBINDEX, TROPONINI in the last 168 hours.  BNP: Invalid input(s): POCBNP  CBG: Recent Labs  Lab 01/22/22 1801 01/23/22 0029 01/23/22 0455 01/23/22 0935 01/23/22 1157  GLUCAP 172* 117* 107* 108* 119*     Microbiology: Results for orders placed or performed during the hospital encounter of 01/21/22  Urine Culture     Status: Abnormal   Collection Time: 01/21/22 10:25 AM   Specimen: Urine, Random  Result Value Ref Range Status   Specimen Description   Final    URINE, RANDOM Performed  at [facility], [address] [place], [place] [address]    Special Requests   Final    NONE Performed at [facility], [address] [place], [place] [address]    Culture MULTIPLE SPECIES PRESENT, SUGGEST RECOLLECTION (A)  Final   Report Status 01/23/2022 FINAL  Final  Blood Culture (routine x 2)     Status: None (Preliminary result)   Collection Time: 01/21/22  1:12 PM   Specimen: BLOOD  Result Value Ref Range Status   Specimen Description BLOOD BLOOD RIGHT ARM  Final   Special Requests   Final    BOTTLES DRAWN AEROBIC AND ANAEROBIC Blood Culture adequate volume   Culture   Final    NO GROWTH 2 DAYS Performed at  [facility], [address] [place], [place] [address]    Report Status PENDING  Incomplete  Blood Culture (routine x 2)     Status: None (Preliminary result)   Collection Time: 01/21/22  1:12 PM   Specimen: BLOOD  Result Value Ref Range Status   Specimen Description BLOOD BLOOD RIGHT FOREARM  Final   Special Requests   Final    BOTTLES DRAWN AEROBIC AND ANAEROBIC Blood Culture adequate volume   Culture   Final    NO GROWTH 2 DAYS Performed at [facility], [address], [place], [place] [address]    Report Status PENDING  Incomplete  Body fluid culture w Gram Stain     Status: None (Preliminary result)   Collection Time: 01/21/22  4:02 PM   Specimen: Chest; Body Fluid  Result Value Ref Range Status   Specimen Description   Final    CHEST FLUID Performed at [facility], [place] [address] [place], [place] [address]    Special Requests   Final    NONE Performed at [facility], [place], [place] [address]    Gram Stain NO WBC SEEN NO ORGANISMS SEEN   Final   Culture   Final    FEW GRAM NEGATIVE RODS CULTURE REINCUBATED FOR BETTER GROWTH Performed at [facility], [place] [address] [place], [place] [address]    Report Status PENDING  Incomplete  Aerobic/Anaerobic Culture w Gram Stain (surgical/deep wound)     Status: None (Preliminary result)   Collection Time: 01/22/22  3:56 PM   Specimen: Pleural Fluid  Result Value Ref Range Status   Specimen Description   Final    PLEURAL RIGHT Performed at [facility], [address], [place], [place] [address]    Special Requests   Final    PLE FLU Performed at [facility], [address] [place], [place] [address]    Gram Stain   Final    RARE WBC PRESENT, PREDOMINANTLY MONONUCLEAR NO ORGANISMS SEEN    Culture   Final    NO GROWTH < 12 HOURS Performed at [facility], [place] [address], [place],  [address]    Report Status PENDING   Incomplete    Coagulation Studies: Recent Labs    01/21/22 1312  LABPROT 15.0  INR 1.2     Urinalysis: Recent Labs    01/21/22 1025  COLORURINE BROWN*  LABSPEC 1.020  PHURINE 7.0  GLUCOSEU NEGATIVE  HGBUR MODERATE*  BILIRUBINUR SMALL*  KETONESUR NEGATIVE  PROTEINUR >300*  NITRITE POSITIVE*  LEUKOCYTESUR LARGE*       Imaging: DG Lumbar Spine 2-3 Views  Result Date: 01/21/2022 CLINICAL DATA:  Back pain EXAM: LUMBAR SPINE - 2-3 VIEW COMPARISON:  10/11/2018 FINDINGS: No recent fracture is seen. There is previous vertebroplasty in the iliac bones on both sides. Degenerative changes are noted with bony spurs and facet hypertrophy. There is no significant disc space narrowing. Arterial calcifications are seen in the aorta and its major branches. IMPRESSION: No recent fracture is seen. Lumbar spondylosis. No significant interval changes are noted. Electronically Signed   By: Elmer Picker M.D.   On: 01/21/2022 16:48   DG Chest Port 1 View  Result Date: 01/22/2022 CLINICAL DATA:  Hemothorax, diabetes mellitus, hypertension EXAM: PORTABLE CHEST 1 VIEW COMPARISON:  Portable exam is 1019 hours compared to 01/21/2022 FINDINGS: Enlargement of cardiac silhouette with vascular congestion. Atherosclerotic calcification aorta. Large loculated fluid collection at the lateral RIGHT chest, could represent hemothorax or loculated pleural effusion. Significant atelectasis of mid to lower RIGHT lung. Scattered infiltrates in both lungs. No pneumothorax. Rib fractures seen on prior CT exam are not well visualized radiographically. IMPRESSION: Persistent loculated collection at lateral mid to lower RIGHT hemithorax which could represent hemothorax or loculated effusion. Significant atelectasis of the adjacent mid to lower RIGHT lung with scattered infiltrates in remaining lungs bilaterally. No pneumothorax. Aortic Atherosclerosis (ICD10-I70.0). Electronically Signed   By: Lavonia Dana M.D.   On:  01/22/2022 10:27   DG Chest Port 1 View  Result Date: 01/21/2022 CLINICAL DATA:  Back pain. Kidney disease with current UTI. Post thoracentesis. EXAM: PORTABLE CHEST 1 VIEW COMPARISON:  AP chest 01/21/2022, CT chest 02/18/2017, AP chest 12/09/2021 FINDINGS: Cardiac silhouette appears mildly enlarged. Mediastinal contours are within normal limits. Mild calcification within aortic arch. There is not a definite change in the increased density apparent represent a taller than wide loculated pleural effusion of the inferolateral right hemithorax. Heterogeneous airspace opacification within the right mid and lower lung. No definite left pleural effusion. No pneumothorax. No definite interstitial pulmonary edema. Moderate multilevel degenerative disc changes of the thoracic spine. IMPRESSION: Persistent moderate-to-large loculated right pleural effusion involving the inferolateral right hemithorax. Electronically Signed   By: Yvonne Kendall M.D.   On: 01/21/2022 16:49   CT IMAGE GUIDED FLUID DRAIN BY CATHETER  Result Date: 01/22/2022 INDICATION: Complex loculated right pleural effusion EXAM: CT-GUIDED 14 FRENCH RIGHT CHEST TUBE PLACEMENT MEDICATIONS: The patient is currently admitted to the hospital and receiving intravenous antibiotics. The antibiotics were administered within an appropriate time frame prior to the initiation of the procedure. ANESTHESIA/SEDATION: Moderate (conscious) sedation was employed during this procedure. A total of Versed 0 mg and Fentanyl 25 mcg was administered intravenously by the radiology nurse. Total intra-service moderate Sedation Time: None. The patient's level of consciousness and vital signs were monitored continuously by radiology nursing throughout the procedure under my direct supervision. COMPLICATIONS: None immediate. PROCEDURE: Informed written consent was obtained from the patient through a [place] interpreter after a thorough discussion of the procedural risks, benefits and  alternatives. All questions were addressed. Maximal Sterile Barrier Technique was utilized including caps, mask, sterile gowns, sterile gloves, sterile drape, hand hygiene and skin antiseptic. A timeout was performed prior to the initiation of the procedure. Previous imaging reviewed. Patient positioned slightly right anterior oblique. Noncontrast imaging performed through the chest. An anterior lower intercostal space was localized and marked for access. Under sterile conditions and local anesthesia, an 18 gauge 10 cm access was advanced into the loculated right pleural effusion. Needle position confirmed with CT. Guidewire inserted followed by tract dilatation to insert a 14 French drain. Drain catheter  position confirmed with CT. Syringe aspiration yielded serosanguineous blood-tinged fluid. Sample sent for culture. Catheter secured with Prolene suture and connected to external pleura vac. Sterile dressing applied. No immediate complication. Patient tolerated the procedure well. IMPRESSION: Successful CT-guided 14 French right chest tube insertion. Electronically Signed   By: Jerilynn Mages.  Shick M.D.   On: 01/22/2022 16:15   ECHOCARDIOGRAM LIMITED  Result Date: 01/23/2022    ECHOCARDIOGRAM LIMITED REPORT   Patient Name:   EBIN PALAZZI Date of Exam: 01/22/2022 Medical Rec #:  [id number]              Height:       65.0 in Accession #:    [id number]             Weight:       170.0 lb Date of Birth:  23-Sep-1941               BSA:          1.846 m Patient Age:    81 years               BP:           115/43 mmHg Patient Gender: M                      HR:           76 bpm. Exam Location:  [facility] Procedure: 2D Echo, Limited Echo, Limited Color Doppler and Cardiac Doppler Indications:     D62.22 Acute Diastolic CHF                   COVID +  History:         Patient has prior history of Echocardiogram examinations, most                  recent 03/27/2017. Signs/Symptoms:Chronic kidney disease; Risk                   Factors:Hypertension, Diabetes and Dyslipidemia.  Sonographer:     Cresenciano Lick RDCS Referring Phys:  [id number] [place] Diagnosing Phys: Nelva Bush MD IMPRESSIONS  1. There is mild left ventricular hypertrophy. Left ventricular diastolic parameters are consistent with Grade I diastolic dysfunction (impaired relaxation). Elevated left atrial pressure.  2. Right ventricular systolic function is normal. The right ventricular size is normal.  3. The mitral valve is normal in structure. Trivial mitral valve regurgitation. No evidence of mitral stenosis.  4. The aortic valve is tricuspid. There is mild calcification of the aortic valve. There is mild thickening of the aortic valve. Aortic valve regurgitation is not visualized. Aortic valve sclerosis is present, with no evidence of aortic valve stenosis.  5. Pulmonic valve regurgitation not well assessed.  6. The inferior vena cava is normal in size with <50% respiratory variability, suggesting right atrial pressure of 8 mmHg. FINDINGS  Left Ventricle: The left ventricular internal cavity size was normal in size. There is mild left ventricular hypertrophy. Left ventricular diastolic parameters are consistent with Grade I diastolic dysfunction (impaired relaxation). Elevated left atrial  pressure. Right Ventricle: The right ventricular size is normal. No increase in right ventricular wall thickness. Right ventricular systolic function is normal. Pericardium: There is no evidence of pericardial effusion. Mitral Valve: The mitral valve is normal in structure. Mild mitral annular calcification. Trivial mitral valve regurgitation. No evidence of mitral valve stenosis. Tricuspid Valve: The tricuspid valve is normal in structure. Tricuspid valve regurgitation is  trivial. Aortic Valve: The aortic valve is tricuspid. There is mild calcification of the aortic valve. There is mild thickening of the aortic valve. Aortic valve regurgitation is not visualized. Aortic  valve sclerosis is present, with no evidence of aortic valve stenosis. Pulmonic Valve: The pulmonic valve was not well visualized. Pulmonic valve regurgitation not well assessed. Aorta: The aortic root is normal in size and structure. Pulmonary Artery: The pulmonary artery is not well seen. Venous: The inferior vena cava is normal in size with less than 50% respiratory variability, suggesting right atrial pressure of 8 mmHg. IAS/Shunts: The interatrial septum was not well visualized. LEFT VENTRICLE PLAX 2D LVIDd:         4.70 cm Diastology LVIDs:         3.20 cm LV e' medial:    6.85 cm/s LV PW:         1.20 cm LV E/e' medial:  16.9 LV IVS:        1.20 cm LV e' lateral:   10.30 cm/s                        LV E/e' lateral: 11.3  RIGHT VENTRICLE             IVC RV S prime:     13.30 cm/s  IVC diam: 1.50 cm TAPSE (M-mode): 2.6 cm LEFT ATRIUM         Index LA diam:    5.60 cm 3.03 cm/m   AORTA Ao Root diam: 3.50 cm MITRAL VALVE MV Area (PHT): 2.87 cm MV Decel Time: 264 msec MV E velocity: 116.00 cm/s MV A velocity: 120.00 cm/s MV E/A ratio:  0.97 Harrell Gave End MD Electronically signed by Nelva Bush MD Signature Date/Time: 01/23/2022/7:14:02 AM    Final    IR THORACENTESIS ASP PLEURAL SPACE W/IMG GUIDE  Result Date: 01/21/2022 INDICATION: Patient with history of ESRD who presented to the ED with abdominal pain and difficulty urinating found to have large right loculated pleural effusion. Patient noted to have right sided rib fractures on CT abdomen/pelvis today as well. Request to IR for diagnostic right thoracentesis. EXAM: ULTRASOUND GUIDED RIGHT THORACENTESIS MEDICATIONS: 6 mL 1% lidocaine COMPLICATIONS: None immediate. PROCEDURE: An ultrasound guided thoracentesis was thoroughly discussed with the patient and questions answered. The benefits, risks, alternatives and complications were also discussed. The patient understands and wishes to proceed with the procedure. Written consent was obtained. Ultrasound  was performed to localize and mark an adequate pocket of fluid in the right chest. The area was then prepped and draped in the normal sterile fashion. 1% Lidocaine was used for local anesthesia. Under ultrasound guidance a 6 Fr Safe-T-Centesis catheter was introduced. Thoracentesis was performed. The catheter was removed and a dressing applied. FINDINGS: A total of approximately 10 mL of serosanguineous fluid was removed. Samples were sent to the laboratory as requested by the clinical team. IMPRESSION: Successful ultrasound guided right thoracentesis yielding 10 mL of pleural fluid. Appearance of the right pleural fluid on ultrasound is compatible with a posttraumatic hemothorax, which would be compatible with the patient's posterior rib fractures identified on recent CT. This would not be amenable to further image guided drainage. Consider surgical consultation for further management. Read by Candiss Norse, PA-C Electronically Signed   By: Albin Felling M.D.   On: 01/21/2022 16:19     Medications:    sodium chloride     sodium chloride     sodium chloride  piperacillin-tazobactam (ZOSYN)  IV 2.25 g (01/23/22 0949)   vancomycin Stopped (01/22/22 1739)    sodium chloride   Intravenous Once   allopurinol  50 mg Oral Daily   atorvastatin  20 mg Oral Daily   Chlorhexidine Gluconate Cloth  6 each Topical Q0600   cholecalciferol  1,000 Units Oral Daily   epoetin (EPOGEN/PROCRIT) injection  10,000 Units Intravenous Q T,Th,Sa-HD   famotidine  20 mg Oral Daily   ferrous sulfate  325 mg Oral BID WC   heparin injection (subcutaneous)  5,000 Units Subcutaneous Q8H   lactulose  20 g Oral TID   senna-docusate  2 tablet Oral QHS   sodium chloride flush  3 mL Intravenous Q12H   tamsulosin  0.4 mg Oral QPC supper     Assessment/ Plan:  Martin Gilbert is a 81 y.o. Hispanic male who speaks [place] and limited [place] and [place].  Patient with end stage renal disease on hemodialysis,  hypertension, BPH, diabetes mellitus type II, hyperlipidemia who presents to [facility] on 01/21/2022 for Urinary retention [R33.9] Back pain [M54.9] UTI (urinary tract infection) [N39.0] Pleural effusion [J90] Pleural effusion on right [J90] Acute cystitis with hematuria [N30.01] Chest pain, unspecified type [R07.9] Sepsis, due to unspecified organism, unspecified whether acute organ dysfunction present ([place]) [A41.9] Acute cough [R05.1]   [facility] TTS [facility] Left AVF 73kg.    End stage renal disease: Continue TTS schedule. Received dialysis yesterday, with reduced UF of 229ml due to decreased diastolic. Next treatment scheduled for Thursday.   Anemia of chronic kidney disease: hemoglobin 8.5 - Continue EPO with dialysis treatments   Hypertension: 134/66. Home regimen includes hydralazine, metoprolol and tamsulosin.   Secondary Hyperparathyroidism: Continue cholecalciferol daily.    LOS: 2 [place] 2/8/202312:07 PM

## 2022-01-23 NOTE — Progress Notes (Signed)
Pt laying in bed, not responding to verbal stimuli, BP 88/47, Pulse 110, SPO2 82% on r/a. 3 L of N/C applied. RRT called. MD notified will come and assess pt. ABG and chest xray ordered.

## 2022-01-24 ENCOUNTER — Encounter: Payer: Self-pay | Admitting: Internal Medicine

## 2022-01-24 DIAGNOSIS — J9601 Acute respiratory failure with hypoxia: Secondary | ICD-10-CM

## 2022-01-24 DIAGNOSIS — Z7189 Other specified counseling: Secondary | ICD-10-CM

## 2022-01-24 DIAGNOSIS — A419 Sepsis, unspecified organism: Secondary | ICD-10-CM | POA: Diagnosis not present

## 2022-01-24 DIAGNOSIS — L89312 Pressure ulcer of right buttock, stage 2: Secondary | ICD-10-CM | POA: Diagnosis present

## 2022-01-24 DIAGNOSIS — D638 Anemia in other chronic diseases classified elsewhere: Secondary | ICD-10-CM | POA: Diagnosis not present

## 2022-01-24 DIAGNOSIS — J942 Hemothorax: Secondary | ICD-10-CM | POA: Diagnosis not present

## 2022-01-24 DIAGNOSIS — G9341 Metabolic encephalopathy: Secondary | ICD-10-CM | POA: Diagnosis not present

## 2022-01-24 DIAGNOSIS — R778 Other specified abnormalities of plasma proteins: Secondary | ICD-10-CM

## 2022-01-24 DIAGNOSIS — Z515 Encounter for palliative care: Secondary | ICD-10-CM | POA: Diagnosis not present

## 2022-01-24 DIAGNOSIS — N410 Acute prostatitis: Secondary | ICD-10-CM | POA: Diagnosis not present

## 2022-01-24 LAB — COMPREHENSIVE METABOLIC PANEL
ALT: 13 U/L (ref 0–44)
AST: 25 U/L (ref 15–41)
Albumin: 1.6 g/dL — ABNORMAL LOW (ref 3.5–5.0)
Alkaline Phosphatase: 331 U/L — ABNORMAL HIGH (ref 38–126)
Anion gap: 13 (ref 5–15)
BUN: 46 mg/dL — ABNORMAL HIGH (ref 8–23)
CO2: 23 mmol/L (ref 22–32)
Calcium: 7.1 mg/dL — ABNORMAL LOW (ref 8.9–10.3)
Chloride: 99 mmol/L (ref 98–111)
Creatinine, Ser: 6.63 mg/dL — ABNORMAL HIGH (ref 0.61–1.24)
GFR, Estimated: 8 mL/min — ABNORMAL LOW (ref >60)
Glucose, Bld: 85 mg/dL (ref 70–99)
Potassium: 4.1 mmol/L (ref 3.5–5.1)
Sodium: 135 mmol/L (ref 135–145)
Total Bilirubin: 1.4 mg/dL — ABNORMAL HIGH (ref 0.3–1.2)
Total Protein: 5.5 g/dL — ABNORMAL LOW (ref 6.5–8.1)

## 2022-01-24 LAB — CBC WITH DIFFERENTIAL/PLATELET
Abs Immature Granulocytes: 0.11 10*3/uL — ABNORMAL HIGH (ref 0.00–0.07)
Basophils Absolute: 0.1 10*3/uL (ref 0.0–0.1)
Basophils Relative: 0 %
Eosinophils Absolute: 0.2 10*3/uL (ref 0.0–0.5)
Eosinophils Relative: 1 %
HCT: 24 % — ABNORMAL LOW (ref 39.0–52.0)
Hemoglobin: 8 g/dL — ABNORMAL LOW (ref 13.0–17.0)
Immature Granulocytes: 1 %
Lymphocytes Relative: 9 %
Lymphs Abs: 1.3 10*3/uL (ref 0.7–4.0)
MCH: 30.9 pg (ref 26.0–34.0)
MCHC: 33.3 g/dL (ref 30.0–36.0)
MCV: 92.7 fL (ref 80.0–100.0)
Monocytes Absolute: 1.1 10*3/uL — ABNORMAL HIGH (ref 0.1–1.0)
Monocytes Relative: 7 %
Neutro Abs: 12.3 10*3/uL — ABNORMAL HIGH (ref 1.7–7.7)
Neutrophils Relative %: 82 %
Platelets: 176 10*3/uL (ref 150–400)
RBC: 2.59 MIL/uL — ABNORMAL LOW (ref 4.22–5.81)
RDW: 17.5 % — ABNORMAL HIGH (ref 11.5–15.5)
WBC: 15.1 10*3/uL — ABNORMAL HIGH (ref 4.0–10.5)
nRBC: 0 % (ref 0.0–0.2)

## 2022-01-24 LAB — GLUCOSE, CAPILLARY
Glucose-Capillary: 105 mg/dL — ABNORMAL HIGH (ref 70–99)
Glucose-Capillary: 106 mg/dL — ABNORMAL HIGH (ref 70–99)
Glucose-Capillary: 125 mg/dL — ABNORMAL HIGH (ref 70–99)
Glucose-Capillary: 129 mg/dL — ABNORMAL HIGH (ref 70–99)
Glucose-Capillary: 143 mg/dL — ABNORMAL HIGH (ref 70–99)

## 2022-01-24 LAB — AMMONIA: Ammonia: 41 umol/L — ABNORMAL HIGH (ref 9–35)

## 2022-01-24 LAB — MAGNESIUM: Magnesium: 1.7 mg/dL (ref 1.7–2.4)

## 2022-01-24 MED ORDER — MIDODRINE HCL 5 MG PO TABS
5.0000 mg | ORAL_TABLET | Freq: Three times a day (TID) | ORAL | Status: DC
  Administered 2022-01-24 – 2022-01-29 (×14): 5 mg via ORAL
  Filled 2022-01-24 (×16): qty 1

## 2022-01-24 NOTE — Assessment & Plan Note (Addendum)
Patient presented with suprapubic pain, difficulty urinating.  CAT scan showed a small lucency, possible prostatitis versus prostatic abscess.  Urology were consulted, recommended medical treatment and outpatient follow-up.  Sepsis was ruled out.  Urine culture with ESBL - Continue meropenem for 6 weeks, 42 days, with end date 3/23  - Ertapenem 500 IV daily via IJ PICC line  - Weekly CBC/d and CMP while on Ertapenem - Consult ID, appreciate cares  - Continue Flomax - Maintain foley at discharge --> Needs Urology follow up with Dr. [name]

## 2022-01-24 NOTE — Assessment & Plan Note (Addendum)
-   Continue Lactulose, patient had very loose stools/diarrhea, skipped a dose of lactulose on 2/18, NH3 level 16  - Continue midodrine - Continue famotidine

## 2022-01-24 NOTE — Assessment & Plan Note (Addendum)
At baseline the patient does not use oxygen.  Here he had hypoxia and dyspnea and he required up to 5 and 6 L of oxygen at times due to effusion, now weaned to room air.

## 2022-01-24 NOTE — Progress Notes (Signed)
Patient's daughter spoke to the charge nurse and is requesting a medical note written on behalf of her brother Martin Gilbert regarding his father's medical condition, in order for him to obtain a passport to come to [place] and see his father, Martin Gilbert. Information passed on to patient's nurse.

## 2022-01-24 NOTE — Assessment & Plan Note (Signed)
Due to complicated UTI with acute urinary retention, possibly prostatitis.

## 2022-01-24 NOTE — Progress Notes (Signed)
Hemodialysis Notes  10:16 Hemodialysis started. Pt able to tolerate treatment. 12:47 Hd completed with zero UF net removal. Total treatment time 2.5 hours.   Post treatment pt is alert, BP was 119/50 however pt complained of abdominal pain. PS of 10. RN Helane Rima was made aware. He gave pain medication.

## 2022-01-24 NOTE — Progress Notes (Signed)
OT Cancellation Note  Patient Details Name: Martin Gilbert MRN: [id number] DOB: June 26, 1941   Cancelled Treatment:    Reason Eval/Treat Not Completed: Other (comment). Consult received, chart reviewed. Per RN, HD preparing for bedside HD treatment and pt also noted with low BP this morning. Will re-attempt at later date/time as pt is appropriate for therapy.   Ardeth Perfect., MPH, MS, OTR/L ascom [phone] 01/24/22, 8:50 AM

## 2022-01-24 NOTE — Assessment & Plan Note (Addendum)
Glucose controlled adequately off insulin -Continue statin

## 2022-01-24 NOTE — Progress Notes (Signed)
Pulmonary Medicine          Date: 01/24/2022,   MRN# [id number] Martin Gilbert September 09, 1941     A HISTORY OF PRESENT ILLNESS   More awake. Discussed care with his attending. Cns sedating meds limited. On 3-4 liters fio2. Following regarding loculated hemorraghic pleural effusion.    PAST MEDICAL HISTORY   Past Medical History:  Diagnosis Date   Anemia    Arthritis    Back pain    BPH (benign prostatic hyperplasia)    CKD (chronic kidney disease), stage IV ([name])    Clostridium difficile colitis    Diabetes mellitus without complication ([name])    GERD (gastroesophageal reflux disease)    HTN (hypertension)    Hyperlipidemia      SURGICAL HISTORY   Past Surgical History:  Procedure Laterality Date   A/V FISTULAGRAM Left 04/20/2018   Procedure: A/V FISTULAGRAM;  Surgeon: Algernon Huxley, MD;  Location: [place] [facility];  Service: Cardiovascular;  Laterality: Left;   A/V FISTULAGRAM Left 11/25/2018   Procedure: A/V FISTULAGRAM;  Surgeon: Algernon Huxley, MD;  Location: [facility];  Service: Cardiovascular;  Laterality: Left;   A/V FISTULAGRAM Left 01/11/2019   Procedure: A/V FISTULAGRAM;  Surgeon: Algernon Huxley, MD;  Location: [place] [facility];  Service: Cardiovascular;  Laterality: Left;   AV FISTULA PLACEMENT Left 06/13/2017   Procedure: ARTERIOVENOUS (AV) FISTULA CREATION ( RADIAL CEPHALIC );  Surgeon: Katha Cabal, MD;  Location: [facility];  Service: Vascular;  Laterality: Left;   CATARACT EXTRACTION     one eye not sure which   DIALYSIS/PERMA CATHETER INSERTION N/A 04/03/2017   Procedure: Dialysis/Perma Catheter Insertion;  Surgeon: Algernon Huxley, MD;  Location: [place] [facility];  Service: Cardiovascular;  Laterality: N/A;   DIALYSIS/PERMA CATHETER INSERTION N/A 08/06/2017   Procedure: DIALYSIS/PERMA CATHETER INSERTION;  Surgeon: Algernon Huxley, MD;  Location: [place] [facility];  Service: Cardiovascular;  Laterality: N/A;   DIALYSIS/PERMA  CATHETER REMOVAL N/A 08/04/2017   Procedure: DIALYSIS/PERMA CATHETER REMOVAL;  Surgeon: Algernon Huxley, MD;  Location: [place] [facility];  Service: Cardiovascular;  Laterality: N/A;   DIALYSIS/PERMA CATHETER REMOVAL N/A 12/31/2017   Procedure: DIALYSIS/PERMA CATHETER REMOVAL;  Surgeon: Algernon Huxley, MD;  Location: [place] [facility];  Service: Cardiovascular;  Laterality: N/A;   hernia repair     IR THORACENTESIS ASP PLEURAL SPACE W/IMG GUIDE  01/21/2022   SACROPLASTY N/A 09/30/2018   Procedure: SACROPLASTY;  Surgeon: Hessie Knows, MD;  Location: [facility];  Service: Orthopedics;  Laterality: N/A;   UPPER EXTREMITY ANGIOGRAPHY Left 09/05/2017   Procedure: Upper Extremity Angiography;  Surgeon: Katha Cabal, MD;  Location: [place] [facility];  Service: Cardiovascular;  Laterality: Left;     FAMILY HISTORY   Family History  Problem Relation Age of Onset   Hypertension Mother    Diabetes Neg Hx      SOCIAL HISTORY   Social History   Tobacco Use   Smoking status: Never   Smokeless tobacco: Never  Vaping Use   Vaping Use: Never used  Substance Use Topics   Alcohol use: No    Comment: Drank in the past, but has stopped for several years.    Drug use: No     MEDICATIONS    Home Medication:    Current Medication:  Current Facility-Administered Medications:    0.9 %  sodium chloride infusion (Manually program via Guardrails IV Fluids), , Intravenous,  Once, Irene Pap N, DO   0.9 %  sodium chloride infusion, 250 mL, Intravenous, PRN, Agbata, Tochukwu, MD   acetaminophen (TYLENOL) tablet 650 mg, 650 mg, Oral, Q6H PRN, Agbata, Tochukwu, MD, 650 mg at 01/21/22 2155   allopurinol (ZYLOPRIM) tablet 50 mg, 50 mg, Oral, Daily, Agbata, Tochukwu, MD, 50 mg at 01/24/22 0823   alteplase (CATHFLO ACTIVASE) injection 2 mg, 2 mg, Intracatheter, Once PRN, Colon Flattery, NP   atorvastatin (LIPITOR) tablet 20 mg, 20 mg, Oral, Daily, Agbata, Tochukwu, MD, 20 mg at 01/24/22 3785    Chlorhexidine Gluconate Cloth 2 % PADS 6 each, 6 each, Topical, Q0600, Colon Flattery, NP, 6 each at 01/24/22 0515   cholecalciferol (VITAMIN D) tablet 1,000 Units, 1,000 Units, Oral, Daily, Agbata, Tochukwu, MD, 1,000 Units at 01/24/22 0823   epoetin alfa (EPOGEN) injection 10,000 Units, 10,000 Units, Intravenous, Q T,Th,Sa-HD, Breeze, Shantelle, NP, 10,000 Units at 01/24/22 1052   famotidine (PEPCID) tablet 20 mg, 20 mg, Oral, Daily, Agbata, Tochukwu, MD, 20 mg at 01/24/22 0823   ferrous sulfate tablet 325 mg, 325 mg, Oral, BID WC, Agbata, Tochukwu, MD, 325 mg at 01/24/22 0823   guaiFENesin (ROBITUSSIN) 100 MG/5ML liquid 10 mL, 10 mL, Oral, Q4H PRN, Agbata, Tochukwu, MD, 10 mL at 01/23/22 1217   haloperidol lactate (HALDOL) injection 1 mg, 1 mg, Intravenous, Q6H PRN, Starla Link, Kshitiz, MD, 1 mg at 01/23/22 0916   heparin injection 1,000 Units, 1,000 Units, Dialysis, PRN, Colon Flattery, NP   heparin injection 5,000 Units, 5,000 Units, Subcutaneous, Q8H, Hall, Carole N, DO, 5,000 Units at 01/24/22 1348   ipratropium-albuterol (DUONEB) 0.5-2.5 (3) MG/3ML nebulizer solution 3 mL, 3 mL, Nebulization, Q4H PRN, Starla Link, Kshitiz, MD   lactulose (CHRONULAC) 10 GM/15ML solution 20 g, 20 g, Oral, TID, Alekh, Kshitiz, MD, 20 g at 01/24/22 0824   lidocaine (PF) (XYLOCAINE) 1 % injection 5 mL, 5 mL, Intradermal, PRN, Breeze, Shantelle, NP   lidocaine-prilocaine (EMLA) cream 1 application, 1 application, Topical, PRN, Breeze, Shantelle, NP   LORazepam (ATIVAN) injection 0.5 mg, 0.5 mg, Intravenous, Q6H PRN, Starla Link, Kshitiz, MD   melatonin tablet 2.5 mg, 2.5 mg, Oral, QHS PRN, Hall, Carole N, DO   midodrine (PROAMATINE) tablet 5 mg, 5 mg, Oral, TID WC, Kolluru, Sarath, MD, 5 mg at 01/24/22 8850   oxyCODONE (Oxy IR/ROXICODONE) immediate release tablet 5 mg, 5 mg, Oral, Q6H PRN, [place] Crane, Carole N, DO, 5 mg at 01/24/22 1329   pentafluoroprop-tetrafluoroeth (GEBAUERS) aerosol 1 application, 1 application, Topical, PRN,  Breeze, Shantelle, NP   piperacillin-tazobactam (ZOSYN) IVPB 2.25 g, 2.25 g, Intravenous, Q8H, Darnelle Bos, RPH, Last Rate: 100 mL/hr at 01/24/22 0820, 2.25 g at 01/24/22 0820   prochlorperazine (COMPAZINE) injection 5 mg, 5 mg, Intravenous, Q6H PRN, Hall, Carole N, DO   senna-docusate (Senokot-S) tablet 2 tablet, 2 tablet, Oral, QHS, Hall, Carole N, DO   sodium chloride flush (NS) 0.9 % injection 3 mL, 3 mL, Intravenous, Q12H, Agbata, Tochukwu, MD, 3 mL at 01/24/22 0820   sodium chloride flush (NS) 0.9 % injection 3 mL, 3 mL, Intravenous, PRN, Agbata, Tochukwu, MD   tamsulosin (FLOMAX) capsule 0.4 mg, 0.4 mg, Oral, QPC supper, Agbata, Tochukwu, MD, 0.4 mg at 01/22/22 1757   vancomycin (VANCOCIN) IVPB 1000 mg/200 mL premix, 1,000 mg, Intravenous, Q T,Th,Sa-HD, Darnelle Bos, RPH, Last Rate: 200 mL/hr at 01/24/22 1138, 1,000 mg at 01/24/22 1138    ALLERGIES   Patient has no known allergies.     REVIEW OF SYSTEMS  Review of Systems:  Gen:  Denies  fever, sweats, chills weigh loss  HEENT: Denies blurred vision, double vision, ear pain, eye pain, hearing loss, nose bleeds, sore throat Cardiac:  No dizziness, chest pain or heaviness, chest tightness,edema Resp:   Denies cough or sputum porduction, shortness of breath,wheezing, hemoptysis,  Gi: Denies swallowing difficulty, stomach pain, nausea or vomiting, diarrhea, constipation, bowel incontinence Gu:  Denies bladder incontinence, burning urine Ext:   Denies Joint pain, stiffness or swelling Skin: Denies  skin rash, easy bruising or bleeding or hives Endoc:  Denies polyuria, polydipsia , polyphagia or weight change Psych:   Denies depression, insomnia or hallucinations   Other:  All other systems negative   VS: BP 131/68    Pulse 89    Temp 98.1 F (36.7 C) (Oral)    Resp 20    Ht 5\' 5"  (1.651 m)    Wt 75.7 kg    SpO2 97%    BMI 27.77 kg/m      PHYSICAL EXAM    GENERAL:more awake HEAD: Normocephalic, atraumatic.   EYES: Pupils equal, round, reactive to light. Extraocular muscles intact. No scleral icterus.  MOUTH: Moist mucosal membrane. Dentition intact. No abscess noted.  EAR, NOSE, THROAT: Clear without exudates. No external lesions.  NECK: Supple. No thyromegaly. No nodules. No JVD.  PULMONARY: right basal consolidation CARDIOVASCULAR: S1 and S2. Regular rate and rhythm. No murmurs, rubs, or gallops. No edema. Pedal pulses 2+ bilaterally.  GASTROINTESTINAL: Soft, nontender, nondistended. No masses. Positive bowel sounds. No hepatosplenomegaly.  MUSCULOSKELETAL: No swelling, clubbing, or edema. Range of motion full in all extremities.  NEUROLOGIC: Cranial nerves II through XII are intact. No gross focal neurological deficits. Sensation intact. Reflexes intact.  SKIN: No ulceration, lesions, rashes, or cyanosis. Skin warm and dry. Turgor intact.  PSYCHIATRIC: Mood, affect within normal limits. The patient is awake, alert and oriented x 3. Insight, judgment intact.       IMAGING    CT ABDOMEN PELVIS WO CONTRAST  Result Date: 01/21/2022 CLINICAL DATA:  Left lower quadrant abdominal pain with difficulty urinating for the past 3 weeks. Diarrhea. History of end-stage renal disease on hemodialysis. EXAM: CT ABDOMEN AND PELVIS WITHOUT CONTRAST TECHNIQUE: Multidetector CT imaging of the abdomen and pelvis was performed following the standard protocol without IV contrast. RADIATION DOSE REDUCTION: This exam was performed according to the departmental dose-optimization program which includes automated exposure control, adjustment of the mA and/or kV according to patient size and/or use of iterative reconstruction technique. COMPARISON:  Abdominopelvic CT 06/15/2021. Chest radiographs earlier today. FINDINGS: Lower chest: As seen on earlier radiographs, there is new moderate-sized laterally loculated right pleural effusion. There are some air bubbles posteriorly in the right pleural space (image 16/2). There is  compressive atelectasis or consolidation of the right lower lobe. Streaky left lower lobe pulmonary opacities are similar to the previous study. No evidence of pneumothorax. Atherosclerosis of the aorta and coronary arteries noted. Hepatobiliary: Nodular contours of the liver again noted suspicious for early cirrhosis. No focal lesion identified without contrast. No evidence of gallstones, gallbladder wall thickening or biliary dilatation. Pancreas: Unremarkable. No pancreatic ductal dilatation or surrounding inflammatory changes. Spleen: Normal in size without focal abnormality. Adrenals/Urinary Tract: Both adrenal glands appear normal. Bilateral renal cortical thinning with nonspecific perinephric soft tissue stranding again noted bilaterally. Small low-density renal lesions bilaterally are stable, likely cysts. No evidence of urinary tract calculus or hydronephrosis. The bladder appears grossly unremarkable. Stomach/Bowel: No enteric contrast administered. The stomach is  poorly distended with possible generalized wall thickening, similar to the previous study. No evidence of bowel wall thickening, distention or surrounding inflammation. Vascular/Lymphatic: There are several mildly enlarged lymph nodes in the pelvis, including right iliac nodes measuring 8 mm on image 75/2 and 12 mm on image 81/2. There is a 10 mm left external iliac node on image 80/2. Aortic and branch vessel atherosclerosis without acute vascular findings on noncontrast imaging. Reproductive: The prostate gland is enlarged. There are multiple new areas of ill-defined low-density within the gland, measuring up to 3.1 x 2.6 cm on image 92/2. No definite surrounding inflammatory changes to suggest that this is inflammatory. Mild perirectal soft tissue stranding is similar to the previous study. Other: Mild soft tissue stranding throughout the mesenteric fat without significant ascites. No free air. Musculoskeletal: No acute or significant osseous  findings. Previous bilateral iliac bone osteoplasty. IMPRESSION: 1. New ill-defined low density throughout the prostate gland which could reflect prostate cancer or prostatitis/abscess formation. Correlate with serum PSA levels. Recommend urology consultation. 2. Several mildly enlarged pelvic lymph nodes suspicious for metastatic disease. 3. No evidence of hydronephrosis or focal bladder abnormality. Stable chronic renal cortical thinning consistent with chronic renal failure. 4. As seen on earlier chest radiographs, laterally loculated right pleural effusion with air in the pleural space which may indicate empyema. Correlate clinically. Compressive right lower lobe atelectasis or consolidation. 5.  Aortic Atherosclerosis (ICD10-I70.0). Electronically Signed   By: Richardean Sale M.D.   On: 01/21/2022 11:59   DG Chest 2 View  Result Date: 01/21/2022 CLINICAL DATA:  81 year old male with history of chest pain and cough. EXAM: CHEST - 2 VIEW COMPARISON:  Chest x-ray 12/09/2021. FINDINGS: Diffuse interstitial prominence and widespread peribronchial cuffing again noted. New moderate to large partially loculated right pleural effusion with increasing atelectasis and/or consolidation throughout the right mid to lower lung. Probable areas of subsegmental atelectasis at the left lung base. No definite left pleural effusion. No pneumothorax. No evidence of pulmonary edema. Heart size is mildly enlarged. Upper mediastinal contours are within normal limits. IMPRESSION: 1. New moderate to large right pleural effusion which is likely partially loculated with extensive atelectasis and/or consolidation in the right mid to lower lung. 2. Probable subsegmental atelectasis in the left lung base. 3. Diffuse interstitial prominence and peribronchial cuffing, concerning for an acute bronchitis. 4. Mild cardiomegaly. 5. Aortic atherosclerosis. Electronically Signed   By: Vinnie Langton M.D.   On: 01/21/2022 10:48   DG Lumbar  Spine 2-3 Views  Result Date: 01/21/2022 CLINICAL DATA:  Back pain EXAM: LUMBAR SPINE - 2-3 VIEW COMPARISON:  10/11/2018 FINDINGS: No recent fracture is seen. There is previous vertebroplasty in the iliac bones on both sides. Degenerative changes are noted with bony spurs and facet hypertrophy. There is no significant disc space narrowing. Arterial calcifications are seen in the aorta and its major branches. IMPRESSION: No recent fracture is seen. Lumbar spondylosis. No significant interval changes are noted. Electronically Signed   By: Elmer Picker M.D.   On: 01/21/2022 16:48   DG Chest Port 1 View  Result Date: 01/23/2022 CLINICAL DATA:  Dyspnea R06.00 (ICD-10-CM) EXAM: PORTABLE CHEST 1 VIEW COMPARISON:  February 7, 23. FINDINGS: Interval placement of a right chest tube with tip along the right lateral and inferior chest. Mildly decreased loculated right pleural effusion. Similar adjacent consolidation and/or atelectasis. Small left pleural effusion with mild overlying left basilar opacities. No visible pneumothorax. Cardiomediastinal silhouette is similar. No evidence of acute osseous abnormality. IMPRESSION: 1. Interval  placement of a right chest tube withmildly decreased loculated right pleural effusion. Similar adjacent consolidation and/or atelectasis. 2. Small left pleural effusion with mild overlying left basilar opacities. Electronically Signed   By: Margaretha Sheffield M.D.   On: 01/23/2022 16:40   DG Chest Port 1 View  Result Date: 01/22/2022 CLINICAL DATA:  Hemothorax, diabetes mellitus, hypertension EXAM: PORTABLE CHEST 1 VIEW COMPARISON:  Portable exam is 1019 hours compared to 01/21/2022 FINDINGS: Enlargement of cardiac silhouette with vascular congestion. Atherosclerotic calcification aorta. Large loculated fluid collection at the lateral RIGHT chest, could represent hemothorax or loculated pleural effusion. Significant atelectasis of mid to lower RIGHT lung. Scattered infiltrates in  both lungs. No pneumothorax. Rib fractures seen on prior CT exam are not well visualized radiographically. IMPRESSION: Persistent loculated collection at lateral mid to lower RIGHT hemithorax which could represent hemothorax or loculated effusion. Significant atelectasis of the adjacent mid to lower RIGHT lung with scattered infiltrates in remaining lungs bilaterally. No pneumothorax. Aortic Atherosclerosis (ICD10-I70.0). Electronically Signed   By: Lavonia Dana M.D.   On: 01/22/2022 10:27   DG Chest Port 1 View  Result Date: 01/21/2022 CLINICAL DATA:  Back pain. Kidney disease with current UTI. Post thoracentesis. EXAM: PORTABLE CHEST 1 VIEW COMPARISON:  AP chest 01/21/2022, CT chest 02/18/2017, AP chest 12/09/2021 FINDINGS: Cardiac silhouette appears mildly enlarged. Mediastinal contours are within normal limits. Mild calcification within aortic arch. There is not a definite change in the increased density apparent represent a taller than wide loculated pleural effusion of the inferolateral right hemithorax. Heterogeneous airspace opacification within the right mid and lower lung. No definite left pleural effusion. No pneumothorax. No definite interstitial pulmonary edema. Moderate multilevel degenerative disc changes of the thoracic spine. IMPRESSION: Persistent moderate-to-large loculated right pleural effusion involving the inferolateral right hemithorax. Electronically Signed   By: Yvonne Kendall M.D.   On: 01/21/2022 16:49   CT IMAGE GUIDED FLUID DRAIN BY CATHETER  Result Date: 01/22/2022 INDICATION: Complex loculated right pleural effusion EXAM: CT-GUIDED 14 FRENCH RIGHT CHEST TUBE PLACEMENT MEDICATIONS: The patient is currently admitted to the hospital and receiving intravenous antibiotics. The antibiotics were administered within an appropriate time frame prior to the initiation of the procedure. ANESTHESIA/SEDATION: Moderate (conscious) sedation was employed during this procedure. A total of Versed 0  mg and Fentanyl 25 mcg was administered intravenously by the radiology nurse. Total intra-service moderate Sedation Time: None. The patient's level of consciousness and vital signs were monitored continuously by radiology nursing throughout the procedure under my direct supervision. COMPLICATIONS: None immediate. PROCEDURE: Informed written consent was obtained from the patient through a Spanish interpreter after a thorough discussion of the procedural risks, benefits and alternatives. All questions were addressed. Maximal Sterile Barrier Technique was utilized including caps, mask, sterile gowns, sterile gloves, sterile drape, hand hygiene and skin antiseptic. A timeout was performed prior to the initiation of the procedure. Previous imaging reviewed. Patient positioned slightly right anterior oblique. Noncontrast imaging performed through the chest. An anterior lower intercostal space was localized and marked for access. Under sterile conditions and local anesthesia, an 18 gauge 10 cm access was advanced into the loculated right pleural effusion. Needle position confirmed with CT. Guidewire inserted followed by tract dilatation to insert a 14 French drain. Drain catheter position confirmed with CT. Syringe aspiration yielded serosanguineous blood-tinged fluid. Sample sent for culture. Catheter secured with Prolene suture and connected to external pleura vac. Sterile dressing applied. No immediate complication. Patient tolerated the procedure well. IMPRESSION: Successful CT-guided 14 French right  chest tube insertion. Electronically Signed   By: Jerilynn Mages.  Shick M.D.   On: 01/22/2022 16:15   ECHOCARDIOGRAM LIMITED  Result Date: 01/23/2022    ECHOCARDIOGRAM LIMITED REPORT   Patient Name:   Martin Gilbert Date of Exam: 01/22/2022 Medical Rec #:  [id number]              Height:       65.0 in Accession #:    [id number]             Weight:       170.0 lb Date of Birth:  10-19-1941               BSA:          1.846 m  Patient Age:    28 years               BP:           115/43 mmHg Patient Gender: M                      HR:           76 bpm. Exam Location:  [facility] Procedure: 2D Echo, Limited Echo, Limited Color Doppler and Cardiac Doppler Indications:     S50.53 Acute Diastolic CHF                   COVID +  History:         Patient has prior history of Echocardiogram examinations, most                  recent 03/27/2017. Signs/Symptoms:Chronic kidney disease; Risk                  Factors:Hypertension, Diabetes and Dyslipidemia.  Sonographer:     Cresenciano Lick RDCS Referring Phys:  [id number] [place] Diagnosing Phys: Nelva Bush MD IMPRESSIONS  1. There is mild left ventricular hypertrophy. Left ventricular diastolic parameters are consistent with Grade I diastolic dysfunction (impaired relaxation). Elevated left atrial pressure.  2. Right ventricular systolic function is normal. The right ventricular size is normal.  3. The mitral valve is normal in structure. Trivial mitral valve regurgitation. No evidence of mitral stenosis.  4. The aortic valve is tricuspid. There is mild calcification of the aortic valve. There is mild thickening of the aortic valve. Aortic valve regurgitation is not visualized. Aortic valve sclerosis is present, with no evidence of aortic valve stenosis.  5. Pulmonic valve regurgitation not well assessed.  6. The inferior vena cava is normal in size with <50% respiratory variability, suggesting right atrial pressure of 8 mmHg. FINDINGS  Left Ventricle: The left ventricular internal cavity size was normal in size. There is mild left ventricular hypertrophy. Left ventricular diastolic parameters are consistent with Grade I diastolic dysfunction (impaired relaxation). Elevated left atrial  pressure. Right Ventricle: The right ventricular size is normal. No increase in right ventricular wall thickness. Right ventricular systolic function is normal. Pericardium: There is no evidence of pericardial  effusion. Mitral Valve: The mitral valve is normal in structure. Mild mitral annular calcification. Trivial mitral valve regurgitation. No evidence of mitral valve stenosis. Tricuspid Valve: The tricuspid valve is normal in structure. Tricuspid valve regurgitation is trivial. Aortic Valve: The aortic valve is tricuspid. There is mild calcification of the aortic valve. There is mild thickening of the aortic valve. Aortic valve regurgitation is not visualized. Aortic valve sclerosis is present, with no evidence of aortic valve  stenosis. Pulmonic Valve: The pulmonic valve was not well visualized. Pulmonic valve regurgitation not well assessed. Aorta: The aortic root is normal in size and structure. Pulmonary Artery: The pulmonary artery is not well seen. Venous: The inferior vena cava is normal in size with less than 50% respiratory variability, suggesting right atrial pressure of 8 mmHg. IAS/Shunts: The interatrial septum was not well visualized. LEFT VENTRICLE PLAX 2D LVIDd:         4.70 cm Diastology LVIDs:         3.20 cm LV e' medial:    6.85 cm/s LV PW:         1.20 cm LV E/e' medial:  16.9 LV IVS:        1.20 cm LV e' lateral:   10.30 cm/s                        LV E/e' lateral: 11.3  RIGHT VENTRICLE             IVC RV S prime:     13.30 cm/s  IVC diam: 1.50 cm TAPSE (M-mode): 2.6 cm LEFT ATRIUM         Index LA diam:    5.60 cm 3.03 cm/m   AORTA Ao Root diam: 3.50 cm MITRAL VALVE MV Area (PHT): 2.87 cm MV Decel Time: 264 msec MV E velocity: 116.00 cm/s MV A velocity: 120.00 cm/s MV E/A ratio:  0.97 Harrell Gave End MD Electronically signed by Nelva Bush MD Signature Date/Time: 01/23/2022/7:14:02 AM    Final    IR THORACENTESIS ASP PLEURAL SPACE W/IMG GUIDE  Result Date: 01/21/2022 INDICATION: Patient with history of ESRD who presented to the ED with abdominal pain and difficulty urinating found to have large right loculated pleural effusion. Patient noted to have right sided rib fractures on CT  abdomen/pelvis today as well. Request to IR for diagnostic right thoracentesis. EXAM: ULTRASOUND GUIDED RIGHT THORACENTESIS MEDICATIONS: 6 mL 1% lidocaine COMPLICATIONS: None immediate. PROCEDURE: An ultrasound guided thoracentesis was thoroughly discussed with the patient and questions answered. The benefits, risks, alternatives and complications were also discussed. The patient understands and wishes to proceed with the procedure. Written consent was obtained. Ultrasound was performed to localize and mark an adequate pocket of fluid in the right chest. The area was then prepped and draped in the normal sterile fashion. 1% Lidocaine was used for local anesthesia. Under ultrasound guidance a 6 Fr Safe-T-Centesis catheter was introduced. Thoracentesis was performed. The catheter was removed and a dressing applied. FINDINGS: A total of approximately 10 mL of serosanguineous fluid was removed. Samples were sent to the laboratory as requested by the clinical team. IMPRESSION: Successful ultrasound guided right thoracentesis yielding 10 mL of pleural fluid. Appearance of the right pleural fluid on ultrasound is compatible with a posttraumatic hemothorax, which would be compatible with the patient's posterior rib fractures identified on recent CT. This would not be amenable to further image guided drainage. Consider surgical consultation for further management. Read by Candiss Norse, PA-C Electronically Signed   By: Albin Felling M.D.   On: 01/21/2022 16:19     DIAGNOSIS:  A. PLEURAL FLUID, RIGHT; ULTRASOUND-GUIDED THORACENTESIS:  - NEGATIVE; NO EVIDENCE OF MALIGNANCY.  - BLOOD AND MIXED INFLAMMATORY CELLS.  ASSESSMENT/PLAN   Right pleural effusion, rib fracture, post traumatic hemorrhagic pleural effusion. S/p  pigtail catheter placement per IR.  Looks exudative.  Cytology pending, no growth so far. The high neutrophils point to ? Infection. Pleural effusion  non  malignant   Cxr 01/22/22. Atelectasis and  large remaining loculated effusion.    -Repeat cxr in am -Per above consider intra pleural tpa ( relative contraindication in his case), -vs vats debridement ( will not pursue since he is DNR). Attempting to make contact with t-surg. At his age and overall condition vats may be prohibitive. -incentive spirometry -continue supportive care       Thank you for allowing me to participate in the care of this patient.   Patient/Family are satisfied with care plan and all questions have been answered.  This document was prepared using Dragon voice recognition software and may include unintentional dictation errors.     Wallene Huh, M.D.  Division of [facility]

## 2022-01-24 NOTE — Assessment & Plan Note (Addendum)
-   Consult Nephrology for maintenance HD, appreciate cares Right IJ catheter was pulled, chest x-ray was done to confirm position 2/15 Hypokalemia potassium 3.1, potassium 40 mEq x 1 dose given potassium 3.2---3.4--3.9, Resolved,  Hypocalcemia, calcium repleted orally, vitamin D level 34

## 2022-01-24 NOTE — Consult Note (Signed)
Consultation Note Date: 01/24/2022   Patient Name: Martin Gilbert  DOB: April 01, 1941  MRN: [id number]  Age / Sex: 81 y.o., male  PCP: Martin Median, MD Referring Physician: Edwin Gilbert, *  Reason for Consultation: Establishing goals of care  HPI/Patient Profile: 81 y.o. 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 difficile infection, GERD, admitted on 02/15/8249 with complicated UTI with urinary retention/question prostatitis, moderate to large right pleural effusion partially loculated.   Clinical Assessment and Goals of Care: I have reviewed medical records including EPIC notes, labs and imaging, received report from RN, assessed the patient.  Present today at bedside is attending, Dr. Loleta Gilbert, patient's wife and granddaughter.  Interpreter services were used.   We meet at the bedside to discuss diagnosis prognosis, GOC, EOL wishes, disposition and options.  Attending, Dr. Loleta Gilbert spoke at length about Martin Gilbert's acute and chronic health concerns.  All questions were asked and answered.  The treatment plan was discussed in detail, including time for outcomes.  Advanced directives, concepts specific to code status, have been addressed and Martin Gilbert is DNR.  Discussed the importance of continued conversation with family and the medical providers regarding overall plan of care and treatment options, ensuring decisions are within the context of the patients values and GOCs.  Questions and concerns were addressed.   The family was encouraged to call with questions or concerns.  PMT will continue to support holistically.  Conference with attending and transition of care team related to patient condition, needs, goals of care, disposition.   HCPOA NEXT OF KIN -family is  supporting Martin Gilbert in his decision making.  He speaks a dialect of Spanish and is at times unable to understand the interpreter.  Family is at bedside including daughters, wife, granddaughters.    SUMMARY OF RECOMMENDATIONS   At this point continue to treat the treatable but no CPR or intubation. Time for outcomes PMT to follow-up 2/13   Code Status/Advance Care Planning: DNR  Symptom Management:  Per hospitalist, no additional needs at this time.  Palliative Prophylaxis:  Frequent Pain Assessment and Turn Reposition  Additional Recommendations (Limitations, Scope, Preferences): Continue to treat the treatable but no CPR or intubation, time for outcomes  Psycho-social/Spiritual:  Desire for further Chaplaincy support:no Additional Recommendations: Caregiving  Support/Resources and Interpreter Services  Prognosis:  Unable to determine, based on outcomes.  Guarded at this point.  Discharge Planning:  To be determined, based on outcomes and qualifications.       Primary Diagnoses: Present on Admission:  UTI (urinary tract infection)  ESRD (end stage renal disease) ([name])  Pleural effusion on right  Anemia of chronic disease  Acute metabolic encephalopathy  Sepsis ([place])  Acute on chronic diastolic CHF (congestive heart failure) ([place])  Hepatic cirrhosis ([place])   I have reviewed the medical record, interviewed the patient and family, and examined the patient. The following aspects are  pertinent.  Past Medical History:  Diagnosis Date   Anemia    Arthritis    Back pain    BPH (benign prostatic hyperplasia)    CKD (chronic kidney disease), stage IV ([name])    Clostridium difficile colitis    Diabetes mellitus without complication ([name])    GERD (gastroesophageal reflux disease)    HTN (hypertension)    Hyperlipidemia    Social History   Socioeconomic History   Marital status: Married    Spouse name: Not on file   Number of children: Not on file   Years of  education: Not on file   Highest education level: Not on file  Occupational History   Occupation: retired  Tobacco Use   Smoking status: Never   Smokeless tobacco: Never  Vaping Use   Vaping Use: Never used  Substance and Sexual Activity   Alcohol use: No    Comment: Drank in the past, but has stopped for several years.    Drug use: No   Sexual activity: Not on file  Other Topics Concern   Not on file  Social History Narrative   Not on file   Social Determinants of Health   Financial Resource Strain: Not on file  Food Insecurity: Not on file  Transportation Needs: Not on file  Physical Activity: Not on file  Stress: Not on file  Social Connections: Not on file   Family History  Problem Relation Age of Onset   Hypertension Mother    Diabetes Neg Hx    Scheduled Meds:  sodium chloride   Intravenous Once   allopurinol  50 mg Oral Daily   atorvastatin  20 mg Oral Daily   Chlorhexidine Gluconate Cloth  6 each Topical Q0600   cholecalciferol  1,000 Units Oral Daily   epoetin (EPOGEN/PROCRIT) injection  10,000 Units Intravenous Q T,Th,Sa-HD   famotidine  20 mg Oral Daily   ferrous sulfate  325 mg Oral BID WC   heparin injection (subcutaneous)  5,000 Units Subcutaneous Q8H   lactulose  20 g Oral TID   lactulose  300 mL Rectal Once   midodrine  5 mg Oral TID WC   senna-docusate  2 tablet Oral QHS   sodium chloride flush  3 mL Intravenous Q12H   tamsulosin  0.4 mg Oral QPC supper   Continuous Infusions:  sodium chloride     piperacillin-tazobactam (ZOSYN)  IV 2.25 g (01/24/22 0820)   vancomycin 1,000 mg (01/24/22 1138)   PRN Meds:.sodium chloride, acetaminophen, alteplase, guaiFENesin, haloperidol lactate, heparin, ipratropium-albuterol, lidocaine (PF), lidocaine-prilocaine, LORazepam, melatonin, oxyCODONE, pentafluoroprop-tetrafluoroeth, prochlorperazine, sodium chloride flush Medications Prior to Admission:  Prior to Admission medications   Medication Sig Start Date  End Date Taking? Authorizing Provider  acetaminophen (TYLENOL) 325 MG tablet Take 2 tablets (650 mg total) by mouth every 6 (six) hours as needed for mild pain (or Fever >/= 101). 11/28/17  Yes Gouru, Illene Silver, MD  allopurinol (ZYLOPRIM) 100 MG tablet Take 0.5 tablets (50 mg total) by mouth daily. Patient taking differently: Take 100 mg by mouth daily. 10/29/21  Yes Wouk, Ailene Rud, MD  ferrous sulfate 325 (65 FE) MG tablet Take 325 mg by mouth 2 (two) times daily with a meal.  08/30/15  Yes [provider]  hydrALAZINE (APRESOLINE) 50 MG tablet Take 50 mg by mouth 3 (three) times daily. 12/18/21  Yes [provider]  lactulose (CHRONULAC) 10 GM/15ML solution Take 20 g (30 mL) 3-4 times a day for a goal of at  least 2 bowel movements a day. 04/28/21  Yes Eugenie Filler, MD  metoprolol succinate (TOPROL-XL) 25 MG 24 hr tablet Take 25 mg by mouth daily.   Yes [provider]  pantoprazole (PROTONIX) 40 MG tablet Take 40 mg by mouth daily. 10/10/21  Yes [provider]  tamsulosin (FLOMAX) 0.4 MG CAPS capsule Take 1 capsule (0.4 mg total) by mouth daily after supper. 02/19/17  Yes Epifanio Lesches, MD  albuterol (VENTOLIN HFA) 108 (90 Base) MCG/ACT inhaler Inhale 1-2 puffs into the lungs every 4 (four) hours as needed for cough. 01/16/22   [provider]  atorvastatin (LIPITOR) 20 MG tablet Take 20 mg by mouth daily. Patient not taking: Reported on 01/21/2022    [provider]  famotidine (PEPCID) 20 MG tablet Take 1 tablet (20 mg total) by mouth daily. Patient not taking: Reported on 01/21/2022 04/16/21 04/16/22  Eugenie Filler, MD  guaiFENesin (ROBITUSSIN) 100 MG/5ML liquid Take 10 mLs by mouth every 4 (four) hours as needed for cough or to loosen phlegm. 12/11/21   Fritzi Mandes, MD  ondansetron (ZOFRAN ODT) 4 MG disintegrating tablet Take 1 tablet (4 mg total) by mouth every 8 (eight) hours as needed for nausea or vomiting. Patient not taking:  Reported on 01/21/2022 06/15/21   Naaman Pl[place], MD  Vitamin D, Cholecalciferol, 1000 UNITS TABS Take 1 tablet by mouth daily.  Patient not taking: Reported on 01/21/2022    [provider]   No Known Allergies Review of Systems  Unable to perform ROS: Other   Physical Exam Vitals and nursing note reviewed.  Constitutional:      General: He is not in acute distress.    Appearance: He is ill-appearing.  Cardiovascular:     Rate and Rhythm: Normal rate.  Pulmonary:     Effort: Pulmonary effort is normal. No respiratory distress.  Skin:    General: Skin is warm and dry.  Neurological:     Mental Status: He is alert.    Vital Signs: BP (!) 144/67    Pulse (!) 57    Temp 98.1 F (36.7 C) (Oral)    Resp 17    Ht 5\' 5"  (1.651 m)    Wt 77 kg    SpO2 98%    BMI 28.25 kg/m  Pain Scale: 0-10 POSS *See Group Information*: 1-Acceptable,Awake and alert Pain Score: 0-No pain   SpO2: SpO2: 98 % O2 Device:SpO2: 98 % O2 Flow Rate: .O2 Flow Rate (L/min): 3 L/min  IO: Intake/output summary:  Intake/Output Summary (Last 24 hours) at 01/24/2022 1235 Last data filed at 01/24/2022 0825 Gross per 24 hour  Intake 1051.67 ml  Output 150 ml  Net 901.67 ml    LBM: Last BM Date: 01/20/21 Baseline Weight: Weight: 90.7 kg Most recent weight: Weight: 77 kg     Palliative Assessment/Data:   Flowsheet Rows    Flowsheet Row Most Recent Value  Intake Tab   Referral Department Hospitalist  Unit at Time of Referral Intermediate Care Unit  Palliative Care Primary Diagnosis Sepsis/Infectious Disease  Date Notified 01/23/22  Palliative Care Type New Palliative care  Reason for referral Clarify Goals of Care  Date of Admission 01/21/22  Date first seen by Palliative Care 01/24/22  # of days Palliative referral response time 1 Day(s)  # of days IP prior to Palliative referral 2  Clinical Assessment   Palliative Performance Scale Score 30%  Pain Max last 24 hours Not able to report  Pain  Min  Last 24 hours Not able to report  Dyspnea Max Last 24 Hours Not able to report  Dyspnea Min Last 24 hours Not able to report  Psychosocial & Spiritual Assessment   Palliative Care Outcomes        Time In: 1020 Time Out: 1115 Time Total: 55 minutes  Greater than 50%  of this time was spent counseling and coordinating care related to the above assessment and plan.  Signed by: Drue Novel, NP   Please contact Palliative Medicine Team phone at [phone] for questions and concerns.  For individual provider: See Shea Evans

## 2022-01-24 NOTE — Assessment & Plan Note (Addendum)
[name] of this seems to be traumatic hemothorax, now organized.  Chest tube was placed without resolution of the effusion.  Thoracentesis culture grew ESBL, ?contaminant.  Later culture from chest tube placement showed no growth.  Discussed the nonresolving effusion with CT surgery, not surgical candidate.  Shared decision making regarding using lytics to reduce the size of the effusion was discussed with family, but ultimately it was elected to remove chest tube, do-nothing.  Given initial uncertainty about etiology of effusion and ?tuberculosis, Quantiferon gold was obtained --> this showed evidence of prior exposure.    We ruled out TB with 3 acid-fast smears negative, Low clinical suspicion for TB as well. ID following

## 2022-01-24 NOTE — Assessment & Plan Note (Addendum)
-  Continue iron, EPO

## 2022-01-24 NOTE — Assessment & Plan Note (Signed)
-   Mepilex dressing

## 2022-01-24 NOTE — Evaluation (Signed)
Physical Therapy Evaluation Patient Details Name: Martin Gilbert MRN: [id number] DOB: 22-Jun-1941 Today's Date: 01/24/2022  History of Present Illness  Pt admitted for sepsis secondary to UTI with retention. History of ESRD on HD, HTN, HLD, Cdiff, and GERD. Pt with chest tube and is currently covid +. Evaluation performed with in person interpreter Kennyth Lose.  Clinical Impression  Pt is a pleasant 81 year old male who was admitted for sepsis. Pt performs bed mobility with min assist and transfers with max assist. Unable to perform ambulation at this time. Pt demonstrates deficits with strength/endurance/mobility. Pt with limited insight into deficits and reports he has sufficient assist in home and declines therapy recommendation. Pt makes it known that he has had poor experience with physical therapy in the past and would likely refuse follow up services. Would benefit from skilled PT to address above deficits and promote optimal return to PLOF; recommend transition to STR upon discharge from acute hospitalization.      Recommendations for follow up therapy are one component of a multi-disciplinary discharge planning process, led by the attending physician.  Recommendations may be updated based on patient status, additional functional criteria and insurance authorization.  Follow Up Recommendations Skilled nursing-short term rehab (<3 hours/day)    Assistance Recommended at Discharge Frequent or constant Supervision/Assistance  Patient can return home with the following  Two people to help with walking and/or transfers;A lot of help with bathing/dressing/bathroom    Equipment Recommendations  (TBD)  Recommendations for Other Services       Functional Status Assessment Patient has had a recent decline in their functional status and demonstrates the ability to make significant improvements in function in a reasonable and predictable amount of time.     Precautions / Restrictions  Precautions Precautions: Fall Restrictions Weight Bearing Restrictions: No      Mobility  Bed Mobility Overal bed mobility: Needs Assistance Bed Mobility: Supine to Sit     Supine to sit: Min assist     General bed mobility comments: needs assist for B LE and trunkal elevation. Once seated, able to sit with upright posture. Able to tolerate sitting x 10 mins at EOB.    Transfers Overall transfer level: Needs assistance Equipment used: Rolling walker (2 wheels) Transfers: Sit to/from Stand Sit to Stand: Max assist           General transfer comment: Multiple attempts to stand at bedside with pt unable to lift buttocks off bed. With therapist assist, able to scoot laterally at EOB prior to returning back supine. All mobility performed on 3L of O2 with sats WNL.    Ambulation/Gait               General Gait Details: unable to perform  Stairs            Wheelchair Mobility    Modified Rankin (Stroke Patients Only)       Balance Overall balance assessment: Needs assistance Sitting-balance support: Feet supported Sitting balance-Leahy Scale: Good                                       Pertinent Vitals/Pain Pain Assessment Pain Assessment: Faces Faces Pain Scale: Hurts even more Pain Location: abdomen Pain Descriptors / Indicators: Aching Pain Intervention(s): Limited activity within patient's tolerance, Premedicated before session    Home Living Family/patient expects to be discharged to:: Private residence Living Arrangements: Spouse/significant other Available Help  at Discharge: Family Type of Home: House Home Access: Stairs to enter (reports stair enterance and ramp enterance) Entrance Stairs-Rails: Can reach both Entrance Stairs-Number of Steps: 2   Home Layout: One level Home Equipment: Rollator (4 wheels);BSC/3in1;Shower seat;Wheelchair - manual;Cane - single point      Prior Function Prior Level of Function : Needs  assist;History of Falls (last six months)       Physical Assist : Mobility (physical)     Mobility Comments: household ambulation with SPC, reports multiple falls.       Hand Dominance        Extremity/Trunk Assessment   Upper Extremity Assessment Upper Extremity Assessment: Generalized weakness (B UE grossly 3+/5)    Lower Extremity Assessment Lower Extremity Assessment: Generalized weakness (B LE grossly 3/5)       Communication   Communication: HOH;Prefers language other than [place];Interpreter utilized  Cognition Arousal/Alertness: Awake/alert Behavior During Therapy: WFL for tasks assessed/performed Overall Cognitive Status: Within Functional Limits for tasks assessed                                 General Comments: alert and oriented, however sleepy from just completing dialysis        General Comments      Exercises Other Exercises Other Exercises: seated at EOB, able to sit with upright posture and perform LAQ and al marching x 10 reps with supervision   Assessment/Plan    PT Assessment Patient needs continued PT services  PT Problem List Decreased strength;Decreased activity tolerance;Decreased balance;Decreased mobility;Decreased knowledge of use of DME;Decreased knowledge of precautions;Pain       PT Treatment Interventions Gait training;Therapeutic exercise;Balance training;DME instruction    PT Goals (Current goals can be found in the Care Plan section)  Acute Rehab PT Goals Patient Stated Goal: to go home PT Goal Formulation: With patient Time For Goal Achievement: 09-Mar-2022 Potential to Achieve Goals: Good    Frequency Min 2X/week     Co-evaluation               AM-PAC PT "6 Clicks" Mobility  Outcome Measure Help needed turning from your back to your side while in a flat bed without using bedrails?: A Little Help needed moving from lying on your back to sitting on the side of a flat bed without using bedrails?: A  Little Help needed moving to and from a bed to a chair (including a wheelchair)?: Total Help needed standing up from a chair using your arms (e.g., wheelchair or bedside chair)?: Total Help needed to walk in hospital room?: Total Help needed climbing 3-5 steps with a railing? : Total 6 Click Score: 10    End of Session Equipment Utilized During Treatment: Gait belt;Oxygen Activity Tolerance: Patient limited by fatigue Patient left: in bed;with bed alarm set Nurse Communication: Mobility status PT Visit Diagnosis: Muscle weakness (generalized) (M62.81);History of falling (Z91.81);Difficulty in walking, not elsewhere classified (R26.2);Pain Pain - Right/Left:  (abdomen) Pain - part of body:  (abdomen)    Time: 1420-1440 PT Time Calculation (min) (ACUTE ONLY): 20 min   Charges:   PT Evaluation $PT Eval Moderate Complexity: 1 Mod PT Treatments $Therapeutic Exercise: 8-22 mins        Greggory Stallion, PT, DPT, GCS [phone]   Rashea Hoskie 01/24/2022, 4:39 PM

## 2022-01-24 NOTE — Assessment & Plan Note (Addendum)
Earlier in hospital stay, developed severe confusion, disorientation.  Likely due to infection, hypoxia, possibly medication side effects.  Antibiotics were broadened, psychotropics were minimized, and this resolved.  Continues to have some improving waxing and waning delirium, very mild, mostly at night. 2/18 started Seroquel 12.5 mg p.o. twice daily for acute delirium 2/19 still pt is confused/acute delirium 2/20 very confused, AAO x0, unable to swallow anything, discontinued Seroquel.  Discussed with the patient's family, and they do not want NG tube feeding at this time.  Palliative care will follow with the family regarding goals of care discussion and possible hospice if they will agree

## 2022-01-24 NOTE — Progress Notes (Signed)
Progress Note   Patient: Martin Gilbert MAU:[id number] DOB: 03-31-1941 DOA: 01/21/2022     3 DOS: the patient was seen and examined on 01/24/2022       Brief hospital course: [name] is an 81 y.o. M with ESRD on HD, HTN, DM, dCHF who presented with abdominal pain for 3 weeks.  In the ER, urinalysis (still makes significant urine) showed UTI.  Imaging showed low-density area near prostate concerning ofr prostatitis vs abscess.  Urology consulted and admitted on antibiotics.  Also noted to have loculated RIGHT pleural effusion.   2/6: Admitted on antibiotics, had diagnostic thoracentesis 2/7: CT surgery recommended pigtail catheter, placed 2nd day 2/8: Developed delirium      Assessment and Plan: * Acute prostatitis Patient presented with suprapubic pain, difficulty urinating.  CAT scan showed a small lucency, possible prostatitis versus prostatic abscess.  Urology were consulted, recommended medical treatment and outpatient follow-up.  Sepsis was ruled out. -Continue vancomycin and Zosyn -Follow repeat urine culture  -Continue Flomax - Foley placed on admission --> follow-up with urology after discharge     Sepsis ([name])-resolved as of 01/24/2022, (present on admission) Due to complicated UTI with acute urinary retention, possibly prostatitis.  Urinary retention - See above  Acute respiratory failure with hypoxia ([name]) At baseline the patient does not use oxygen.  Here he is required up to 5 and 6 L of oxygen, now weaned down to 3L Due to pleural effusion  Pleural effusion on right- (present on admission) Suspect this is posttraumatic hemothorax in the setting of posterior rib fractures identified on recent CT scan. Meets lights criteria for exudate but this is hard to interpret in setting of possible hemothorax.  On antibiotics for prostatitis.  Chest tube placed, 400cc drainage first day only 100 yesterday, minimal today.  Unchanged effusion on imaging. -  Consult Pulm -Continue antibiotics - TB rule out    Acute metabolic encephalopathy- (present on admission) At baseline the patient has no significant cognitive impairment, is A&Ox3 per family  Doubt hepatic encephalopathy is driving.  CO2 normal.  Very somnolent yesterday, likely medication effect in setting of advanced age, possibly some mild delirium.  This is somewhat better today, per family.  Assessment complicated by language barrier (he speaks a dialect, not spanish, he is hard of hearing as well). Delirium precautions:   -Lights and TV off, minimize interruptions at night  -Blinds open and lights on during day  -Glasses/hearing aid with patient  -Frequent reorientation  -PT/OT when able  -Avoid sedation medications/[place] list medications     Chronic diastolic CHF (congestive heart failure) ([place])- (present on admission) Acute CHF ruled out. - Minimize fluids   Pressure injury of right buttock, stage 2 ([place])- (present on admission) - Mepilex dressing  Elevated troponin Ischemia ruled out.  Due to poor clearance, non-infarction troponin elevation.  Hepatic cirrhosis ([place])- (present on admission) Per imaging.  No clinical signs of cirrhosis, and I do not suspect his confusion is hepatic encephalopathy. -Continue midodrine  Diabetes mellitus, type II ([name]) Glucose controlled adequately -Continue statin  ESRD (end stage renal disease) ([place])- (present on admission) - Consult Nephrology, appreciate cares  Anemia of chronic disease- (present on admission) -Continue iron, EPO          Subjective: All history collected using video phonic interpreter who is really not able to communicate with the patient.  Granddaughter at the bedside able to provide collateral information. No fever, confusion is improved, oriented to hospital and family today (yesterday he  was not able to recognize them). Still has some abdominal pain, feels like he has to pee.  Still has a  cough.  Has pain in his chest tube and moving around.  Physical Exam: Vitals:   01/24/22 1245 01/24/22 1247 01/24/22 1306 01/24/22 1315  BP: 138/70 138/70  (!) 119/50  Pulse: 89 75  (!) 119  Resp: (!) 24 (!) 22  (!) 23  Temp:   98.1 F (36.7 C)   TempSrc:   Oral   SpO2: 100% 100%  99%  Weight:   75.7 kg   Height:       Frail elderly adult male, lying in bed, no acute distress, appears debilitated and weak Right-sided chest tube, lung sounds diminished on the right, good air movement on the left, atelectatic crackles, no wheezing Heart rate regular, soft systolic murmur, no lower extremity edema, no JVD Mentation is slowed, oriented to "hospital", and recognizes family otherwise she is disoriented and very weak generalized, with symmetric strength in both sides, face symmetric.      Data Reviewed: My review of labs and imaging is notable for ammonia only 41, white blood cell count up to 15, hemoglobin 8 and stable. Echocardiogram normal from yesterday Patient metabolic panel unremarkable. Discussed with palliative care.   Family Communication: Granddaughter at the bedside  Disposition: Status is: Inpatient  Remains inpatient appropriate because: He has continued chest tube, need for IV antibiotics.  Long-term prognosis is unclear.  Given his chronic candidate for surgical debridement of his loculated effusion versus coagulated hemothorax, I suspect he will need to discharge with new oxygen and hospice, although awaiting pulmonology input on this     Planned Discharge Destination: To be determined        Author: Edwin Dada, MD 01/24/2022 2:06 PM  For on call review [email or web address].

## 2022-01-24 NOTE — Progress Notes (Signed)
[facility]  ROUNDING NOTE   Subjective:   Martin Gilbert was admitted to [facility] on 01/21/2022 for Urinary retention [R33.9] Back pain [M54.9] UTI (urinary tract infection) [N39.0] Pleural effusion [J90] Pleural effusion on right [J90] Acute cystitis with hematuria [N30.01] Chest pain, unspecified type [R07.9] Sepsis, due to unspecified organism, unspecified whether acute organ dysfunction present [facility]) [A41.9] Acute cough [R05.1]  Last hemodialysis treatment was Saturday, February 4. Left at 72.5kg - below his dry weight.  History is difficult to get even with [place] Interpreter.   Patient seen and evaluated during dialysis   HEMODIALYSIS FLOWSHEET:  Blood Flow Rate (mL/min): 350 mL/min Arterial Pressure (mmHg): -200 mmHg Venous Pressure (mmHg): 190 mmHg Transmembrane Pressure (mmHg): 70 mmHg Ultrafiltration Rate (mL/min): 200 mL/min Dialysate Flow Rate (mL/min): 500 ml/min Conductivity: Machine : 13.5 Conductivity: Machine : 13.5 Dialysis Fluid Bolus: Normal Saline Bolus Amount (mL): 250 mL  Alert with pleasant confusion Reports mild abdominal pain Chest tube remains in place Family at bedside   Objective:  Vital signs in last 24 hours:  Temp:  [96.8 F (36 C)-98.9 F (37.2 C)] 98.1 F (36.7 C) (02/09 1306) Pulse Rate:  [57-126] 119 (02/09 1315) Resp:  [14-29] 23 (02/09 1315) BP: (75-157)/(35-123) 119/50 (02/09 1315) SpO2:  [88 %-100 %] 99 % (02/09 1315) Weight:  [75.7 kg-77 kg] 75.7 kg (02/09 1306)  Weight change: -1.1 kg Filed Weights   01/24/22 0500 01/24/22 0925 01/24/22 1306  Weight: 77 kg 77 kg 75.7 kg    Intake/Output: I/O last 3 completed shifts: In: 1682.2 [I.V.:991.7; Blood:490.5; IV KGMWNUUVO:536] Out: 350 [Urine:150; Chest Tube:200]   Intake/Output this shift:  Total I/O In: 60 [P.O.:60] Out: 0   Physical Exam: General: NAD,resting in bed  Head: Normocephalic, atraumatic. Moist oral mucosal membranes  Eyes:  Anicteric  Lungs:  Basilar rales, normal effort, cough, Right sided Chest tube  Heart: Regular rate and rhythm  Abdomen:  Suprapubic tenderness  Extremities:  No peripheral edema.  Neurologic: Nonfocal, moving all four extremities  Skin: No lesions  Access: Left AVF  GU Foley  Basic Metabolic Panel: Recent Labs  Lab 01/21/22 1025 01/22/22 0424 01/23/22 0434 01/24/22 0627  NA 130* 130* 133* 135  K 3.9 4.3 3.7 4.1  CL 96* 97* 98 99  CO2 24 23 26 23   GLUCOSE 121* 103* 123* 85  BUN 69* 74* 39* 46*  CREATININE 6.52* 7.52* 5.17* 6.63*  CALCIUM 7.9* 7.8* 7.7* 7.1*  MG  --   --   --  1.7  PHOS  --   --  2.7  --      Liver Function Tests: Recent Labs  Lab 01/21/22 1025 01/23/22 0434 01/24/22 0627  AST 52*  --  25  ALT 20  --  13  ALKPHOS 443*  --  331*  BILITOT 1.6*  --  1.4*  PROT 6.3*  --  5.5*  ALBUMIN 1.9* 1.8* 1.6*    Recent Labs  Lab 01/21/22 1025  LIPASE 24    Recent Labs  Lab 01/23/22 0956 01/24/22 0627  AMMONIA 37* 41*     CBC: Recent Labs  Lab 01/21/22 1025 01/22/22 0424 01/22/22 1029 01/23/22 0434 01/24/22 0627  WBC 12.1* 8.1  --  11.0* 15.1*  NEUTROABS 10.2*  --   --   --  12.3*  HGB 7.7* 7.2* 7.2* 8.5* 8.0*  HCT 23.8* 21.7* 21.8* 26.0* 24.0*  MCV 97.9 94.8  --  94.2 92.7  PLT 226 216  --  198 176     Cardiac Enzymes: No results for input(s): CKTOTAL, CKMB, CKMBINDEX, TROPONINI in the last 168 hours.  BNP: Invalid input(s): POCBNP  CBG: Recent Labs  Lab 01/23/22 1554 01/23/22 2037 01/23/22 2335 01/24/22 0333 01/24/22 1339  GLUCAP 75 117* 104* 105* 106*     Microbiology: Results for orders placed or performed during the hospital encounter of 01/21/22  Urine Culture     Status: Abnormal   Collection Time: 01/21/22 10:25 AM   Specimen: Urine, Random  Result Value Ref Range Status   Specimen Description   Final    URINE, RANDOM Performed at [facility], [address] [place], [place] [address]     Special Requests   Final    NONE Performed at [facility], [address] [place], [place] [address]    Culture MULTIPLE SPECIES PRESENT, SUGGEST RECOLLECTION (A)  Final   Report Status 01/23/2022 FINAL  Final  Blood Culture (routine x 2)     Status: None (Preliminary result)   Collection Time: 01/21/22  1:12 PM   Specimen: BLOOD  Result Value Ref Range Status   Specimen Description BLOOD BLOOD RIGHT ARM  Final   Special Requests   Final    BOTTLES DRAWN AEROBIC AND ANAEROBIC Blood Culture adequate volume   Culture   Final    NO GROWTH 3 DAYS Performed at [facility], [address] [place], [place] [address]    Report Status PENDING  Incomplete  Blood Culture (routine x 2)     Status: None (Preliminary result)   Collection Time: 01/21/22  1:12 PM   Specimen: BLOOD  Result Value Ref Range Status   Specimen Description BLOOD BLOOD RIGHT FOREARM  Final   Special Requests   Final    BOTTLES DRAWN AEROBIC AND ANAEROBIC Blood Culture adequate volume   Culture   Final    NO GROWTH 3 DAYS Performed at [facility], [address], [place], [place] [address]    Report Status PENDING  Incomplete  Acid Fast Smear (AFB)     Status: None   Collection Time: 01/21/22  4:02 PM   Specimen: Chest; Body Fluid  Result Value Ref Range Status   AFB Specimen Processing Concentration  Final   Acid Fast Smear Negative  Final    Comment: (NOTE) Performed At: [facility] [address] [place], [place] [id number] Rush Farmer MD ZD:[id number]    Source (AFB) NONE  Final    Comment: Performed at [facility], [place] [address], [place], [place] [address]  Body fluid culture w Gram Stain     Status: None (Preliminary result)   Collection Time: 01/21/22  4:02 PM   Specimen: Chest; Body Fluid  Result Value Ref Range Status   Specimen Description   Final    CHEST FLUID Performed at [facility], [place] [address] [place], [place] [address]     Special Requests   Final    NONE Performed at [facility], [place], [place] [address]    Gram Stain NO WBC SEEN NO ORGANISMS SEEN   Final   Culture   Final    FEW ESCHERICHIA COLI CULTURE REINCUBATED FOR BETTER GROWTH Performed at [facility], [place] [address] [place], [place] [address]    Report Status PENDING  Incomplete  Aerobic/Anaerobic Culture w Gram Stain (surgical/deep wound)     Status: None (Preliminary result)   Collection Time: 01/22/22  3:56 PM   Specimen: Pleural Fluid  Result Value Ref Range Status   Specimen Description   Final    PLEURAL RIGHT Performed at [facility], [address], [place], [place] [address]    Special Requests   Final    PLE FLU Performed at [facility], [address] [place], [place] [address]    Gram Stain   Final    RARE WBC PRESENT, PREDOMINANTLY MONONUCLEAR NO ORGANISMS SEEN    Culture   Final    NO GROWTH 2 DAYS Performed at [facility], [place] [address] [place] [address] [place], [place] [address]    Report Status PENDING  Incomplete  Resp Panel by RT-PCR (Flu A&B, Covid) Nasopharyngeal Swab     Status: None   Collection Time: 01/23/22  4:52 PM   Specimen: Nasopharyngeal Swab; Nasopharyngeal(NP) swabs in vial transport medium  Result Value Ref Range Status   SARS Coronavirus 2 by RT PCR NEGATIVE NEGATIVE Final    Comment: (NOTE) SARS-CoV-2 target nucleic acids are NOT DETECTED.  The SARS-CoV-2 RNA is generally detectable in upper respiratory specimens during the acute phase of infection. The lowest concentration of SARS-CoV-2 viral copies this assay can detect is 138 copies/mL. A negative result does not preclude SARS-Cov-2 infection and should not be used as the sole basis for treatment or other patient management decisions. A negative result may occur with  improper specimen collection/handling, submission of specimen other than nasopharyngeal swab, presence of viral  mutation(s) within the areas targeted by this assay, and inadequate number of viral copies(<138 copies/mL). A negative result must be combined with clinical observations, patient history, and epidemiological information. The expected result is Negative.  Fact Sheet for Patients:  EntrepreneurPulse.com.au  Fact Sheet for Healthcare Providers:  IncredibleEmployment.be  This test is no t yet approved or cleared by the [place] FDA and  has been authorized for detection and/or diagnosis of SARS-CoV-2 by FDA under an Emergency Use Authorization (EUA). This EUA will remain  in effect (meaning this test can be used) for the duration of the COVID-19 declaration under Section 564(b)(1) of the Act, 21 U.S.C.section 360bbb-3(b)(1), unless the authorization is terminated  or revoked sooner.       Influenza A by PCR NEGATIVE NEGATIVE Final   Influenza B by PCR NEGATIVE NEGATIVE Final    Comment: (NOTE) The Xpert Xpress SARS-CoV-2/FLU/RSV plus assay is intended as an aid in the diagnosis of influenza from Nasopharyngeal swab specimens and should not be used as a sole basis for treatment. Nasal washings and aspirates are unacceptable for Xpert Xpress SARS-CoV-2/FLU/RSV testing.  Fact Sheet for Patients: EntrepreneurPulse.com.au  Fact Sheet for Healthcare Providers: IncredibleEmployment.be  This test is not yet approved or cleared by the [place] FDA and has been authorized for detection and/or diagnosis of SARS-CoV-2 by FDA under an Emergency Use Authorization (EUA). This EUA will remain in effect (meaning this test can be used) for the duration of the COVID-19 declaration under Section 564(b)(1) of the Act, 21 U.S.C. section 360bbb-3(b)(1), unless the authorization is terminated or revoked.  Performed at [facility], [address] [place], [place] [address]     Coagulation Studies: No  results for input(s): LABPROT, INR in the last 72 hours.   Urinalysis: No results for input(s): COLORURINE, LABSPEC, PHURINE, GLUCOSEU, HGBUR, BILIRUBINUR, KETONESUR, PROTEINUR, UROBILINOGEN, NITRITE, LEUKOCYTESUR in the last 72 hours.  Invalid input(s): APPERANCEUR     Imaging: DG Chest Port 1 View  Result Date: 01/23/2022 CLINICAL  DATA:  Dyspnea R06.00 (ICD-10-CM) EXAM: PORTABLE CHEST 1 VIEW COMPARISON:  February 7, 23. FINDINGS: Interval placement of a right chest tube with tip along the right lateral and inferior chest. Mildly decreased loculated right pleural effusion. Similar adjacent consolidation and/or atelectasis. Small left pleural effusion with mild overlying left basilar opacities. No visible pneumothorax. Cardiomediastinal silhouette is similar. No evidence of acute osseous abnormality. IMPRESSION: 1. Interval placement of a right chest tube withmildly decreased loculated right pleural effusion. Similar adjacent consolidation and/or atelectasis. 2. Small left pleural effusion with mild overlying left basilar opacities. Electronically Signed   By: Margaretha Sheffield M.D.   On: 01/23/2022 16:40   CT IMAGE GUIDED FLUID DRAIN BY CATHETER  Result Date: 01/22/2022 INDICATION: Complex loculated right pleural effusion EXAM: CT-GUIDED 14 FRENCH RIGHT CHEST TUBE PLACEMENT MEDICATIONS: The patient is currently admitted to the hospital and receiving intravenous antibiotics. The antibiotics were administered within an appropriate time frame prior to the initiation of the procedure. ANESTHESIA/SEDATION: Moderate (conscious) sedation was employed during this procedure. A total of Versed 0 mg and Fentanyl 25 mcg was administered intravenously by the radiology nurse. Total intra-service moderate Sedation Time: None. The patient's level of consciousness and vital signs were monitored continuously by radiology nursing throughout the procedure under my direct supervision. COMPLICATIONS: None immediate.  PROCEDURE: Informed written consent was obtained from the patient through a Spanish interpreter after a thorough discussion of the procedural risks, benefits and alternatives. All questions were addressed. Maximal Sterile Barrier Technique was utilized including caps, mask, sterile gowns, sterile gloves, sterile drape, hand hygiene and skin antiseptic. A timeout was performed prior to the initiation of the procedure. Previous imaging reviewed. Patient positioned slightly right anterior oblique. Noncontrast imaging performed through the chest. An anterior lower intercostal space was localized and marked for access. Under sterile conditions and local anesthesia, an 18 gauge 10 cm access was advanced into the loculated right pleural effusion. Needle position confirmed with CT. Guidewire inserted followed by tract dilatation to insert a 14 French drain. Drain catheter position confirmed with CT. Syringe aspiration yielded serosanguineous blood-tinged fluid. Sample sent for culture. Catheter secured with Prolene suture and connected to external pleura vac. Sterile dressing applied. No immediate complication. Patient tolerated the procedure well. IMPRESSION: Successful CT-guided 14 French right chest tube insertion. Electronically Signed   By: Jerilynn Mages.  Shick M.D.   On: 01/22/2022 16:15   ECHOCARDIOGRAM LIMITED  Result Date: 01/23/2022    ECHOCARDIOGRAM LIMITED REPORT   Patient Name:   Martin Gilbert Date of Exam: 01/22/2022 Medical Rec #:  [id number]              Height:       65.0 in Accession #:    [id number]             Weight:       170.0 lb Date of Birth:  10/07/1941               BSA:          1.846 m Patient Age:    81 years               BP:           115/43 mmHg Patient Gender: M                      HR:           76 bpm. Exam Location:  [facility] Procedure: 2D Echo, Limited Echo, Limited Color  Doppler and Cardiac Doppler Indications:     G40.10 Acute Diastolic CHF                   COVID +  History:         Patient  has prior history of Echocardiogram examinations, most                  recent 03/27/2017. Signs/Symptoms:Chronic kidney disease; Risk                  Factors:Hypertension, Diabetes and Dyslipidemia.  Sonographer:     Cresenciano Lick RDCS Referring Phys:  [id number] [place] Diagnosing Phys: Nelva Bush MD IMPRESSIONS  1. There is mild left ventricular hypertrophy. Left ventricular diastolic parameters are consistent with Grade I diastolic dysfunction (impaired relaxation). Elevated left atrial pressure.  2. Right ventricular systolic function is normal. The right ventricular size is normal.  3. The mitral valve is normal in structure. Trivial mitral valve regurgitation. No evidence of mitral stenosis.  4. The aortic valve is tricuspid. There is mild calcification of the aortic valve. There is mild thickening of the aortic valve. Aortic valve regurgitation is not visualized. Aortic valve sclerosis is present, with no evidence of aortic valve stenosis.  5. Pulmonic valve regurgitation not well assessed.  6. The inferior vena cava is normal in size with <50% respiratory variability, suggesting right atrial pressure of 8 mmHg. FINDINGS  Left Ventricle: The left ventricular internal cavity size was normal in size. There is mild left ventricular hypertrophy. Left ventricular diastolic parameters are consistent with Grade I diastolic dysfunction (impaired relaxation). Elevated left atrial  pressure. Right Ventricle: The right ventricular size is normal. No increase in right ventricular wall thickness. Right ventricular systolic function is normal. Pericardium: There is no evidence of pericardial effusion. Mitral Valve: The mitral valve is normal in structure. Mild mitral annular calcification. Trivial mitral valve regurgitation. No evidence of mitral valve stenosis. Tricuspid Valve: The tricuspid valve is normal in structure. Tricuspid valve regurgitation is trivial. Aortic Valve: The aortic valve is  tricuspid. There is mild calcification of the aortic valve. There is mild thickening of the aortic valve. Aortic valve regurgitation is not visualized. Aortic valve sclerosis is present, with no evidence of aortic valve stenosis. Pulmonic Valve: The pulmonic valve was not well visualized. Pulmonic valve regurgitation not well assessed. Aorta: The aortic root is normal in size and structure. Pulmonary Artery: The pulmonary artery is not well seen. Venous: The inferior vena cava is normal in size with less than 50% respiratory variability, suggesting right atrial pressure of 8 mmHg. IAS/Shunts: The interatrial septum was not well visualized. LEFT VENTRICLE PLAX 2D LVIDd:         4.70 cm Diastology LVIDs:         3.20 cm LV e' medial:    6.85 cm/s LV PW:         1.20 cm LV E/e' medial:  16.9 LV IVS:        1.20 cm LV e' lateral:   10.30 cm/s                        LV E/e' lateral: 11.3  RIGHT VENTRICLE             IVC RV S prime:     13.30 cm/s  IVC diam: 1.50 cm TAPSE (M-mode): 2.6 cm LEFT ATRIUM         Index LA diam:  5.60 cm 3.03 cm/m   AORTA Ao Root diam: 3.50 cm MITRAL VALVE MV Area (PHT): 2.87 cm MV Decel Time: 264 msec MV E velocity: 116.00 cm/s MV A velocity: 120.00 cm/s MV E/A ratio:  0.97 Christopher End MD Electronically signed by Nelva Bush MD Signature Date/Time: 01/23/2022/7:14:02 AM    Final      Medications:    sodium chloride     piperacillin-tazobactam (ZOSYN)  IV 2.25 g (01/24/22 0820)   vancomycin 1,000 mg (01/24/22 1138)    sodium chloride   Intravenous Once   allopurinol  50 mg Oral Daily   atorvastatin  20 mg Oral Daily   Chlorhexidine Gluconate Cloth  6 each Topical Q0600   cholecalciferol  1,000 Units Oral Daily   epoetin (EPOGEN/PROCRIT) injection  10,000 Units Intravenous Q T,Th,Sa-HD   famotidine  20 mg Oral Daily   ferrous sulfate  325 mg Oral BID WC   heparin injection (subcutaneous)  5,000 Units Subcutaneous Q8H   lactulose  20 g Oral TID   midodrine  5 mg Oral  TID WC   senna-docusate  2 tablet Oral QHS   sodium chloride flush  3 mL Intravenous Q12H   tamsulosin  0.4 mg Oral QPC supper     Assessment/ Plan:  Martin Gilbert is a 81 y.o. Hispanic male who speaks [place] and limited [place] and [place].  Patient with end stage renal disease on hemodialysis, hypertension, BPH, diabetes mellitus type II, hyperlipidemia who presents to [facility] on 01/21/2022 for Urinary retention [R33.9] Back pain [M54.9] UTI (urinary tract infection) [N39.0] Pleural effusion [J90] Pleural effusion on right [J90] Acute cystitis with hematuria [N30.01] Chest pain, unspecified type [R07.9] Sepsis, due to unspecified organism, unspecified whether acute organ dysfunction present ([place]) [A41.9] Acute cough [R05.1]   [facility] TTS [facility] Left AVF 73kg.    End stage renal disease: Continue TTS schedule. Receiving dialysis at the bedside due to airborne precautions. Tolerating treatment well, UF goal 0 due to reported hypotension overnight. Next treatment scheduled for Saturday.   Anemia of chronic kidney disease: hemoglobin 8.0 - EPO with dialysis treatments   Hypertension: 119/50 during dialysis.Home regimen includes hydralazine, metoprolol and tamsulosin.   Secondary Hyperparathyroidism: Continue cholecalciferol daily.   -Calcium decreased, will monitor and correct with HD   LOS: [place] 2/9/20232:17 PM

## 2022-01-24 NOTE — Assessment & Plan Note (Addendum)
Acute CHF ruled out. - Minimize fluids

## 2022-01-24 NOTE — Assessment & Plan Note (Signed)
See above

## 2022-01-24 NOTE — Hospital Course (Addendum)
[name] is an 81 y.o. M with ESRD on HD, HTN, DM, dCHF who presented with abdominal pain for 3 weeks.  In the ER, urinalysis (still makes significant urine) showed UTI.  Imaging showed low-density area near prostate concerning ofr prostatitis vs abscess.  Urology consulted and admitted on antibiotics.  Also noted to have loculated RIGHT pleural effusion.   2/6: Admitted on antibiotics, had diagnostic thoracentesis 2/7: CT surgery recommended pigtail catheter, placed 2nd day 2/8: Developed delirium 2/10: Chest fluid culture growing ESBL E coli 2/11: Delirium completely resolved 2/12: Family have elected not to do tPA in pleural catheter 2/14: Chest tube removed

## 2022-01-24 NOTE — Assessment & Plan Note (Signed)
Ischemia ruled out.  Due to poor clearance, non-infarction troponin elevation.

## 2022-01-25 ENCOUNTER — Ambulatory Visit: Payer: Medicare Other | Admitting: Podiatry

## 2022-01-25 ENCOUNTER — Inpatient Hospital Stay: Payer: Medicare Other

## 2022-01-25 DIAGNOSIS — I5032 Chronic diastolic (congestive) heart failure: Secondary | ICD-10-CM | POA: Diagnosis not present

## 2022-01-25 DIAGNOSIS — N410 Acute prostatitis: Secondary | ICD-10-CM | POA: Diagnosis not present

## 2022-01-25 DIAGNOSIS — D638 Anemia in other chronic diseases classified elsewhere: Secondary | ICD-10-CM

## 2022-01-25 DIAGNOSIS — J90 Pleural effusion, not elsewhere classified: Secondary | ICD-10-CM

## 2022-01-25 DIAGNOSIS — N412 Abscess of prostate: Secondary | ICD-10-CM | POA: Diagnosis not present

## 2022-01-25 DIAGNOSIS — G9341 Metabolic encephalopathy: Secondary | ICD-10-CM | POA: Diagnosis not present

## 2022-01-25 DIAGNOSIS — J9601 Acute respiratory failure with hypoxia: Secondary | ICD-10-CM | POA: Diagnosis not present

## 2022-01-25 LAB — BPAM RBC
Blood Product Expiration Date: 202303142359
Blood Product Expiration Date: 202303142359
Blood Product Expiration Date: 202303142359
ISSUE DATE / TIME: 202302072331
Unit Type and Rh: 5100
Unit Type and Rh: 5100
Unit Type and Rh: 5100

## 2022-01-25 LAB — QUANTIFERON-TB GOLD PLUS: QuantiFERON-TB Gold Plus: POSITIVE — AB

## 2022-01-25 LAB — TYPE AND SCREEN
ABO/RH(D): O POS
Antibody Screen: POSITIVE
Unit division: 0
Unit division: 0
Unit division: 0

## 2022-01-25 LAB — CBC
HCT: 24.2 % — ABNORMAL LOW (ref 39.0–52.0)
Hemoglobin: 8.1 g/dL — ABNORMAL LOW (ref 13.0–17.0)
MCH: 30.8 pg (ref 26.0–34.0)
MCHC: 33.5 g/dL (ref 30.0–36.0)
MCV: 92 fL (ref 80.0–100.0)
Platelets: 189 10*3/uL (ref 150–400)
RBC: 2.63 MIL/uL — ABNORMAL LOW (ref 4.22–5.81)
RDW: 17.2 % — ABNORMAL HIGH (ref 11.5–15.5)
WBC: 10.3 10*3/uL (ref 4.0–10.5)
nRBC: 0 % (ref 0.0–0.2)

## 2022-01-25 LAB — GLUCOSE, CAPILLARY
Glucose-Capillary: 109 mg/dL — ABNORMAL HIGH (ref 70–99)
Glucose-Capillary: 112 mg/dL — ABNORMAL HIGH (ref 70–99)
Glucose-Capillary: 114 mg/dL — ABNORMAL HIGH (ref 70–99)
Glucose-Capillary: 134 mg/dL — ABNORMAL HIGH (ref 70–99)
Glucose-Capillary: 149 mg/dL — ABNORMAL HIGH (ref 70–99)
Glucose-Capillary: 97 mg/dL (ref 70–99)

## 2022-01-25 LAB — BODY FLUID CULTURE W GRAM STAIN: Gram Stain: NONE SEEN

## 2022-01-25 LAB — QUANTIFERON-TB GOLD PLUS (RQFGPL)
QuantiFERON Mitogen Value: 1.06 IU/mL
QuantiFERON Nil Value: 0.09 IU/mL
QuantiFERON TB1 Ag Value: 1.81 IU/mL
QuantiFERON TB2 Ag Value: 1.76 IU/mL

## 2022-01-25 MED ORDER — SODIUM CHLORIDE 0.9 % IV SOLN
500.0000 mg | Freq: Every day | INTRAVENOUS | Status: AC
  Administered 2022-01-25 – 2022-01-29 (×5): 500 mg via INTRAVENOUS
  Filled 2022-01-25 (×2): qty 10
  Filled 2022-01-25 (×2): qty 500
  Filled 2022-01-25: qty 10

## 2022-01-25 NOTE — Progress Notes (Signed)
Physical Therapy Treatment Patient Details Name: Martin Gilbert MRN: [id number] DOB: [birth year] Today's Date: 01/25/2022   History of Present Illness Pt admitted for sepsis secondary to UTI with retention. History of ESRD on HD, HTN, HLD, Cdiff, and GERD. Pt with chest tube and is currently covid +. Evaluation performed with in person interpreter Martin Gilbert.    PT Comments    Pt seen for co-tx with OT for pt & therapists' safety. In person interpreter Martin Gilbert) present to assist with communication; pt's daughter & granddaughter also present for session. Pt is able to transfer to sitting EOB with minimal assistance and tolerates sitting EOB ~10 minutes. Pt c/o ankle pain & catheter pain - nurse notified - but endorses this limits his mobility. Pt also with minimal to no motivation to participate, stating he will do therapy at home. PT educates him on importance of OOB mobility while in hospital setting. Pt then agreeable to getting OOB to recliner but not receptive of education re: hand placement nor use of RW to increase ease of transfer despite PT's multiple attempts to educate/cue him. After multiple attempts, pt finally successful with squat pivot bed>recliner with max assist +2 from PT/OT. Pt left in care of OT & NT>  Pt would benefit from ongoing PT services to increase independence with mobility & decrease caregiver burden but pt's motivation & willingness to participate are big limiting factors. If pt declines SNF, pt would benefit from hoyer lift at home.  Of note, PT spoke with pt's daughter Martin Gilbert via telephone who reports pt lives in a mobile home with a ramped entrance & would ambulate with RW in the home but use w/c in the community/for dialysis appointments. During session though, pt reports his son would physically lift him OOB to w/c.    Recommendations for follow up therapy are one component of a multi-disciplinary discharge planning process, led by the attending physician.   Recommendations may be updated based on patient status, additional functional criteria and insurance authorization.  Follow Up Recommendations  Skilled nursing-short term rehab (<3 hours/day)     Assistance Recommended at Discharge Frequent or constant Supervision/Assistance  Patient can return home with the following Two people to help with walking and/or transfers;A lot of help with bathing/dressing/bathroom;Help with stairs or ramp for entrance;Assist for transportation;Direct supervision/assist for financial management;Assistance with [facility]   Equipment Recommendations  Hospital bed;Wheelchair (measurements PT);Wheelchair cushion (measurements PT) (manual hoyer lift)    Recommendations for Other Services       Precautions / Restrictions Precautions Precautions: Fall Precaution Comments: R side chest tube, foley catheter Restrictions Weight Bearing Restrictions: No     Mobility  Bed Mobility Overal bed mobility: Needs Assistance Bed Mobility: Supine to Sit     Supine to sit: Min guard     General bed mobility comments: use of bed rails, extra time    Transfers Overall transfer level: Needs assistance   Transfers: Bed to chair/wheelchair/BSC       Squat pivot transfers: Max assist, +2 physical assistance          Ambulation/Gait                   Stairs             Wheelchair Mobility    Modified Rankin (Stroke Patients Only)       Balance Overall balance assessment: Needs assistance Sitting-balance support: Feet supported Sitting balance-Leahy Scale: Good       Standing balance-Leahy Scale: Poor  Cognition Arousal/Alertness: Awake/alert Behavior During Therapy: Flat affect Overall Cognitive Status: Within Functional Limits for tasks assessed                                 General Comments: Requires max education/encouragement for participation & OOB mobility         Exercises      General Comments General comments (skin integrity, edema, etc.): Pt tolerated sitting EOB ~10 minutes.      Pertinent Vitals/Pain Pain Assessment Pain Assessment: Faces Faces Pain Scale: Hurts even more Pain Location: ankle Pain Descriptors / Indicators: Discomfort Pain Intervention(s): Monitored during session    Home Living                          Prior Function            PT Goals (current goals can now be found in the care plan section) Acute Rehab PT Goals Patient Stated Goal: to go home PT Goal Formulation: With patient Time For Goal Achievement: 14-Feb-2022 Potential to Achieve Goals: Fair Progress towards PT goals: Progressing toward goals    Frequency    Min 2X/week      PT Plan Current plan remains appropriate    Co-evaluation PT/OT/SLP Co-Evaluation/Treatment: Yes Reason for Co-Treatment: For patient/therapist safety PT goals addressed during session: Mobility/safety with mobility;Balance;Proper use of DME        AM-PAC PT "6 Clicks" Mobility   Outcome Measure  Help needed turning from your back to your side while in a flat bed without using bedrails?: A Little Help needed moving from lying on your back to sitting on the side of a flat bed without using bedrails?: A Lot Help needed moving to and from a bed to a chair (including a wheelchair)?: Total Help needed standing up from a chair using your arms (e.g., wheelchair or bedside chair)?: Total Help needed to walk in hospital room?: Total Help needed climbing 3-5 steps with a railing? : Total 6 Click Score: 9    End of Session Equipment Utilized During Treatment: Gait belt;Oxygen Activity Tolerance: Patient limited by fatigue Patient left: in chair (in care of OT & NT) Nurse Communication: Mobility status PT Visit Diagnosis: Muscle weakness (generalized) (M62.81);History of falling (Z91.81);Difficulty in walking, not elsewhere classified (R26.2);Pain Pain -  part of body: Ankle and joints of foot     Time: 0932-1005 PT Time Calculation (min) (ACUTE ONLY): 33 min  Charges:  $Therapeutic Activity: 8-22 mins                     Lavone Nian, PT, DPT 01/25/22, 10:50 AM    Waunita Schooner 01/25/2022, 10:46 AM

## 2022-01-25 NOTE — Evaluation (Signed)
Occupational Therapy Evaluation Patient Details Name: Martin Gilbert MRN: [id number] DOB: 01/13/1941 Today's Date: 01/25/2022   History of Present Illness Pt admitted for sepsis secondary to UTI with retention. History of ESRD on HD, HTN, HLD, Cdiff, and GERD. Pt with chest tube and is currently covid +. Evaluation performed with in person interpreter Kennyth Lose.   Clinical Impression   Pt seen for OT evaluation and co-tx with PT with in-person Spanish interpreter, Jacqui. Pt reporting significant bilat foot pain and foley catheter pain and required heavy encouragement from family and therapists for mobility attempts. Supervision for bed mobility and requiring MAX A +2 for squat pivot transfer to recliner with MAX VC for safety/sequencing. Pt reports he will not be returning to a SNF for rehab as he did not progress at all while there. Pt/family would like pt to return home with them. If pt is to return home, he will require a hospital bed, mechanical lift equipment, and may benefit from skilled OT services.       Recommendations for follow up therapy are one component of a multi-disciplinary discharge planning process, led by the attending physician.  Recommendations may be updated based on patient status, additional functional criteria and insurance authorization.   Follow Up Recommendations  Skilled nursing-short term rehab (<3 hours/day)    Assistance Recommended at Discharge Frequent or constant Supervision/Assistance  Patient can return home with the following Two people to help with bathing/dressing/bathroom;A little help with bathing/dressing/bathroom;Assistance with cooking/housework;Help with stairs or ramp for entrance;Assistance with feeding;Assist for transportation;Direct supervision/assist for medications management    Functional Status Assessment  Patient has had a recent decline in their functional status and demonstrates the ability to make significant improvements in  function in a reasonable and predictable amount of time.  Equipment Recommendations  BSC/3in1    Recommendations for Other Services       Precautions / Restrictions Precautions Precautions: Fall Precaution Comments: R side chest tube, foley catheter Restrictions Weight Bearing Restrictions: No      Mobility Bed Mobility Overal bed mobility: Needs Assistance Bed Mobility: Supine to Sit     Supine to sit: Min guard     General bed mobility comments: use of bed rails, extra time    Transfers Overall transfer level: Needs assistance Equipment used: Rolling walker (2 wheels) Transfers: Bed to chair/wheelchair/BSC     Squat pivot transfers: Max assist, +2 physical assistance       General transfer comment: Multiple attempts to stand at bedside with pt unable to lift buttocks off bed. With therapist assist, able to scoot laterally at EOB prior to returning back supine. All mobility performed on 3L of O2 with sats WNL.      Balance Overall balance assessment: Needs assistance Sitting-balance support: Feet supported Sitting balance-Leahy Scale: Good     Standing balance support: Bilateral upper extremity supported Standing balance-Leahy Scale: Poor                             ADL either performed or assessed with clinical judgement   ADL Overall ADL's : Needs assistance/impaired                                       General ADL Comments: Pt currently requires MAX A for LB ADL, MIN A for UB ADL, MAX A +2 for squat pivot transfers  Vision         Perception     Praxis      Pertinent Vitals/Pain Pain Assessment Pain Assessment: 0-10 Pain Score: 10-Worst pain ever Pain Location: B feet, foley cath Pain Descriptors / Indicators: Discomfort, Aching Pain Intervention(s): Limited activity within patient's tolerance, Monitored during session, Premedicated before session, Repositioned     Hand Dominance     Extremity/Trunk  Assessment Upper Extremity Assessment Upper Extremity Assessment: Generalized weakness   Lower Extremity Assessment Lower Extremity Assessment: Generalized weakness       Communication Communication Communication: HOH;Prefers language other than English;Interpreter utilized (Recruitment consultant)   Cognition Arousal/Alertness: Awake/alert Behavior During Therapy: Flat affect Overall Cognitive Status: Within Functional Limits for tasks assessed                                 General Comments: Requires max education/encouragement for participation & OOB mobility     General Comments  Pt tolerated sitting EOB ~10 minutes.    Exercises     Shoulder Instructions      Home Living Family/patient expects to be discharged to:: Private residence Living Arrangements: Spouse/significant other Available Help at Discharge: Family Type of Home: House Home Access: Stairs to enter (reports stair enterance and ramp enterance) Entrance Stairs-Number of Steps: 2 Entrance Stairs-Rails: Can reach both Home Layout: One level               Home Equipment: Rollator (4 wheels);BSC/3in1;Shower seat;Wheelchair - manual;Cane - single point          Prior Functioning/Environment Prior Level of Function : Needs assist;History of Falls (last six months)       Physical Assist : Mobility (physical)     Mobility Comments: family reports pt has not been mobile in past 69mo 2/2 ankle injuries requiring family to lift him into a w/c, reports multiple falls. ADLs Comments: Per pt/family, pt has required significant assist for bathing, dressing, and toileting especially in past 43mo (wife visually impaired)        OT Problem List: Decreased strength;Pain;Decreased safety awareness;Impaired balance (sitting and/or standing);Decreased knowledge of use of DME or AE      OT Treatment/Interventions: Self-care/ADL training;Therapeutic exercise;Therapeutic activities;DME and/or AE  instruction;Patient/family education;Balance training    OT Goals(Current goals can be found in the care plan section) Acute Rehab OT Goals Patient Stated Goal: have less pain and go home with family OT Goal Formulation: With patient/family Time For Goal Achievement: 02/08/22 Potential to Achieve Goals: Good ADL Goals Pt Will Transfer to Toilet: with mod assist;stand pivot transfer;bedside commode Additional ADL Goal #1: Pt will perform bed mobility with mod indep  OT Frequency: Min 2X/week    Co-evaluation PT/OT/SLP Co-Evaluation/Treatment: Yes Reason for Co-Treatment: For patient/therapist safety;To address functional/ADL transfers PT goals addressed during session: Mobility/safety with mobility;Balance;Proper use of DME OT goals addressed during session: ADL's and self-care;Proper use of Adaptive equipment and DME      AM-PAC OT "6 Clicks" Daily Activity     Outcome Measure Help from another person eating meals?: A Little Help from another person taking care of personal grooming?: A Little Help from another person toileting, which includes using toliet, bedpan, or urinal?: Total Help from another person bathing (including washing, rinsing, drying)?: A Lot Help from another person to put on and taking off regular upper body clothing?: A Little Help from another person to put on and taking off regular lower body  clothing?: A Lot 6 Click Score: 14   End of Session Equipment Utilized During Treatment: Gait belt;Rolling walker (2 wheels) Nurse Communication: Mobility status  Activity Tolerance: Patient tolerated treatment well Patient left: in chair;with call bell/phone within reach;with chair alarm set;with family/visitor present;with nursing/sitter in room  OT Visit Diagnosis: Other abnormalities of gait and mobility (R26.89);Repeated falls (R29.6);Muscle weakness (generalized) (M62.81);Pain Pain - Right/Left: Right (both) Pain - part of body: Ankle and joints of foot                 Time: 0925-1005 OT Time Calculation (min): 40 min Charges:  OT General Charges $OT Visit: 1 Visit OT Evaluation $OT Eval Moderate Complexity: 1 Mod OT Treatments $Self Care/Home Management : 8-22 mins  Ardeth Perfect., MPH, MS, OTR/L ascom [phone] 01/25/22, 1:46 PM

## 2022-01-25 NOTE — Consult Note (Addendum)
NAME: Martin Gilbert  DOB: [birth year]  MRN: [id number]  Date/Time: 01/25/2022 11:31 AM  REQUESTING PROVIDER: Dr.Danford Subjective:  REASON FOR CONSULT: ESBL e.coli empyema history obtained from patient and daughter thru [employer] spanish interpretation service Alexus Michael is a 81 y.o. male with a history of ESRD on HD, HTN, DM, cirrhosis presents from home on 01/21/22 with lower abdominal pain for 2 weeks and difficulty voiding for 2 weeks  HE still makes urine eventhough on dialysis Also c/o B/l lower extremity swelling, back pain, cough for 4 weeks Increasingly weak and unable to ambulate No fever chest pain or sob In the ED BP 109/43, Temp 97.8, Pulse 70 and RR 17 WBC 12.1, HB 7.7, PLT 226 and CR 6.52 CT abdomen and pelvis showed New ill-defined low density throughout the prostate gland which could reflect prostate cancer or prostatitis/abscess formation. He had a foley placed and 200cc of dark urine drained and sent for culture which was reported as multiple species Seen by urologist who advised antibiotics  CXR showed loculated rt pleural effusion-Seen by pulmonologist who requested thoracentesis   IR attempted pleurocentesis on 2/6 and only 10 cc of serosanguineous fluid was removed- organized hemothorax was questioned because of h/o rib fractures Cell count was 63 with 94% N- the fluid color was yellow TP 4.3, glucose 46, no LDH sent Culture of the fluid was ESBL e.coli though gram stain did not show any organisms On 2/7 patient had a pleural cathter placed in the loculated collection and culture from that is so far no growth  I am asked to see the patient to see whether the ESBL e.coli is a true pathogen causing the pleural infection or is it a contaminant  Past Medical History:  Diagnosis Date   Anemia    Arthritis    Back pain    BPH (benign prostatic hyperplasia)    CKD (chronic kidney disease), stage IV ([name])    Clostridium difficile colitis    Diabetes  mellitus without complication ([name])    GERD (gastroesophageal reflux disease)    HTN (hypertension)    Hyperlipidemia     Past Surgical History:  Procedure Laterality Date   A/V FISTULAGRAM Left 04/20/2018   Procedure: A/V FISTULAGRAM;  Surgeon: Algernon Huxley, MD;  Location: [facility];  Service: Cardiovascular;  Laterality: Left;   A/V FISTULAGRAM Left 11/25/2018   Procedure: A/V FISTULAGRAM;  Surgeon: Algernon Huxley, MD;  Location: [facility];  Service: Cardiovascular;  Laterality: Left;   A/V FISTULAGRAM Left 01/11/2019   Procedure: A/V FISTULAGRAM;  Surgeon: Algernon Huxley, MD;  Location: [facility];  Service: Cardiovascular;  Laterality: Left;   AV FISTULA PLACEMENT Left 06/13/2017   Procedure: ARTERIOVENOUS (AV) FISTULA CREATION ( RADIAL CEPHALIC );  Surgeon: Katha Cabal, MD;  Location: [facility];  Service: Vascular;  Laterality: Left;   CATARACT EXTRACTION     one eye not sure which   DIALYSIS/PERMA CATHETER INSERTION N/A 04/03/2017   Procedure: Dialysis/Perma Catheter Insertion;  Surgeon: Algernon Huxley, MD;  Location: [facility];  Service: Cardiovascular;  Laterality: N/A;   DIALYSIS/PERMA CATHETER INSERTION N/A 08/06/2017   Procedure: DIALYSIS/PERMA CATHETER INSERTION;  Surgeon: Algernon Huxley, MD;  Location: [facility];  Service: Cardiovascular;  Laterality: N/A;   DIALYSIS/PERMA CATHETER REMOVAL N/A 08/04/2017   Procedure: DIALYSIS/PERMA CATHETER REMOVAL;  Surgeon: Algernon Huxley, MD;  Location: [facility];  Service: Cardiovascular;  Laterality: N/A;   DIALYSIS/PERMA  CATHETER REMOVAL N/A 12/31/2017   Procedure: DIALYSIS/PERMA CATHETER REMOVAL;  Surgeon: Algernon Huxley, MD;  Location: [facility];  Service: Cardiovascular;  Laterality: N/A;   hernia repair     IR THORACENTESIS ASP PLEURAL SPACE W/IMG GUIDE  01/21/2022   SACROPLASTY N/A 09/30/2018   Procedure: SACROPLASTY;  Surgeon: Hessie Knows, MD;  Location: [facility];   Service: Orthopedics;  Laterality: N/A;   UPPER EXTREMITY ANGIOGRAPHY Left 09/05/2017   Procedure: Upper Extremity Angiography;  Surgeon: Katha Cabal, MD;  Location: [facility];  Service: Cardiovascular;  Laterality: Left;    Social History   Socioeconomic History   Marital status: Married    Spouse name: Not on file   Number of children: Not on file   Years of education: Not on file   Highest education level: Not on file  Occupational History   Occupation: retired  Tobacco Use   Smoking status: Never   Smokeless tobacco: Never  Vaping Use   Vaping Use: Never used  Substance and Sexual Activity   Alcohol use: No    Comment: Drank in the past, but has stopped for several years.    Drug use: No   Sexual activity: Not on file  Other Topics Concern   Not on file  Social History Narrative   Not on file   Social Determinants of Health   Financial Resource Strain: Not on file  Food Insecurity: Not on file  Transportation Needs: Not on file  Physical Activity: Not on file  Stress: Not on file  Social Connections: Not on file  Intimate Partner Violence: Not on file    Family History  Problem Relation Age of Onset   Hypertension Mother    Diabetes Neg Hx    No Known Allergies I? Current Facility-Administered Medications  Medication Dose Route Frequency Provider Last Rate Last Admin   0.9 %  sodium chloride infusion (Manually program via Guardrails IV Fluids)   Intravenous Once Lima, Carole N, DO       0.9 %  sodium chloride infusion  250 mL Intravenous PRN Agbata, Tochukwu, MD       acetaminophen (TYLENOL) tablet 650 mg  650 mg Oral Q6H PRN Agbata, Tochukwu, MD   650 mg at 01/21/22 2155   allopurinol (ZYLOPRIM) tablet 50 mg  50 mg Oral Daily Agbata, Tochukwu, MD   50 mg at 01/25/22 0821   alteplase (CATHFLO ACTIVASE) injection 2 mg  2 mg Intracatheter Once PRN Colon Flattery, NP       atorvastatin (LIPITOR) tablet 20 mg  20 mg Oral Daily Agbata, Tochukwu,  MD   20 mg at 01/25/22 7169   Chlorhexidine Gluconate Cloth 2 % PADS 6 each  6 each Topical Q0600 Colon Flattery, NP   6 each at 01/25/22 0532   cholecalciferol (VITAMIN D) tablet 1,000 Units  1,000 Units Oral Daily Agbata, Tochukwu, MD   1,000 Units at 01/25/22 0821   epoetin alfa (EPOGEN) injection 10,000 Units  10,000 Units Intravenous Q T,Th,Sa-HD Breeze, Benancio Deeds, NP   10,000 Units at 01/24/22 1052   famotidine (PEPCID) tablet 20 mg  20 mg Oral Daily Agbata, Tochukwu, MD   20 mg at 01/25/22 6789   ferrous sulfate tablet 325 mg  325 mg Oral BID WC Agbata, Tochukwu, MD   325 mg at 01/25/22 0821   guaiFENesin (ROBITUSSIN) 100 MG/5ML liquid 10 mL  10 mL Oral Q4H PRN Agbata, Tochukwu, MD   10 mL at 01/23/22 1217  haloperidol lactate (HALDOL) injection 1 mg  1 mg Intravenous Q6H PRN Aline August, MD   1 mg at 01/23/22 0916   heparin injection 1,000 Units  1,000 Units Dialysis PRN Colon Flattery, NP       heparin injection 5,000 Units  5,000 Units Subcutaneous Q8H Irene Pap N, DO   5,000 Units at 01/25/22 0541   ipratropium-albuterol (DUONEB) 0.5-2.5 (3) MG/3ML nebulizer solution 3 mL  3 mL Nebulization Q4H PRN Aline August, MD       lactulose (CHRONULAC) 10 GM/15ML solution 20 g  20 g Oral TID Aline August, MD   20 g at 01/25/22 0820   lidocaine (PF) (XYLOCAINE) 1 % injection 5 mL  5 mL Intradermal PRN Colon Flattery, NP       lidocaine-prilocaine (EMLA) cream 1 application  1 application Topical PRN Breeze, Shantelle, NP       LORazepam (ATIVAN) injection 0.5 mg  0.5 mg Intravenous Q6H PRN Alekh, Kshitiz, MD       melatonin tablet 2.5 mg  2.5 mg Oral QHS PRN Hall, Carole N, DO       meropenem (MERREM) 500 mg in sodium chloride 0.9 % 100 mL IVPB  500 mg Intravenous Daily Danford, Suann Larry, MD       midodrine (PROAMATINE) tablet 5 mg  5 mg Oral TID WC Kolluru, Sarath, MD   5 mg at 01/25/22 0820   oxyCODONE (Oxy IR/ROXICODONE) immediate release tablet 5 mg  5 mg Oral Q6H PRN  Irene Pap N, DO   5 mg at 01/25/22 0545   pentafluoroprop-tetrafluoroeth (GEBAUERS) aerosol 1 application  1 application Topical PRN Colon Flattery, NP       prochlorperazine (COMPAZINE) injection 5 mg  5 mg Intravenous Q6H PRN [place] Crane, Carole N, DO       senna-docusate (Senokot-S) tablet 2 tablet  2 tablet Oral QHS Irene Pap N, DO   2 tablet at 01/24/22 2146   sodium chloride flush (NS) 0.9 % injection 3 mL  3 mL Intravenous Q12H Agbata, Tochukwu, MD   3 mL at 01/25/22 0823   sodium chloride flush (NS) 0.9 % injection 3 mL  3 mL Intravenous PRN Agbata, Tochukwu, MD       tamsulosin (FLOMAX) capsule 0.4 mg  0.4 mg Oral QPC supper Agbata, Tochukwu, MD   0.4 mg at 01/24/22 1701   vancomycin (VANCOCIN) IVPB 1000 mg/200 mL premix  1,000 mg Intravenous Q T,Th,Sa-HD Darnelle Bos, RPH 200 mL/hr at 01/24/22 1138 1,000 mg at 01/24/22 1138     Abtx:  Anti-infectives (From admission, onward)    Start     Dose/Rate Route Frequency Ordered Stop   01/25/22 1100  meropenem (MERREM) 500 mg in sodium chloride 0.9 % 100 mL IVPB        500 mg 200 mL/hr over 30 Minutes Intravenous Daily 01/25/22 1009     01/22/22 1200  vancomycin (VANCOCIN) IVPB 1000 mg/200 mL premix        1,000 mg 200 mL/hr over 60 Minutes Intravenous Every T-Th-Sa (Hemodialysis) 01/21/22 1638     01/21/22 1700  piperacillin-tazobactam (ZOSYN) IVPB 2.25 g  Status:  Discontinued        2.25 g 100 mL/hr over 30 Minutes Intravenous Every 8 hours 01/21/22 1638 01/25/22 1007   01/21/22 1330  vancomycin (VANCOCIN) IVPB 1000 mg/200 mL premix       See Hyperspace for full Linked Orders Report.   1,000 mg 200 mL/hr over 60 Minutes Intravenous  Once  01/21/22 1224 01/21/22 1703   01/21/22 1230  ceFEPIme (MAXIPIME) 2 g in sodium chloride 0.9 % 100 mL IVPB        2 g 200 mL/hr over 30 Minutes Intravenous  Once 01/21/22 1224 01/21/22 1431   01/21/22 1230  vancomycin (VANCOCIN) IVPB 1000 mg/200 mL premix       See Hyperspace for full Linked  Orders Report.   1,000 mg 200 mL/hr over 60 Minutes Intravenous  Once 01/21/22 1224 01/21/22 1530       REVIEW OF SYSTEMS:  Const: negative fever, negative chills, negative weight loss Eyes: negative diplopia or visual changes, negative eye pain ENT: negative coryza, negative sore throat Resp: ++cough, no hemoptysis, dyspnea on exertion Cards: negative for chest pain, palpitations, lower extremity edema GU: inability to pass urine freely,  GI: pain lower abdomen and backn Skin: negative for rash and pruritus Heme: negative for easy bruising and gum/nose bleeding EH:[id number] weakness, fatigue Neurolo:, dizziness, falls, memory problems  Psych: negative for feelings of anxiety, depression  Endocrine: negative for thyroid, diabetes Allergy/Immunology- negative for any medication or food allergies ? Pertinent Positives include : Objective:  VITALS:  BP 128/68 (BP Location: Right Arm)    Pulse 67    Temp 98 F (36.7 C) (Oral)    Resp 17    Ht 5\' 5"  (1.651 m)    Wt 76.6 kg    SpO2 94%    BMI 28.10 kg/m  PHYSICAL EXAM:  General: chronically ill, pale, fatigued, responds to some questions oriented in person and place Head: Normocephalic, without obvious abnormality, atraumatic. Eyes: Conjunctivae clear, anicteric sclerae. Pupils are equal ENT Nares normal. No drainage or sinus tenderness. Lips, mucosa, and tongue normal. No Thrush Poor dentition Neck: Supple, symmetrical, no adenopathy, thyroid: non tender no carotid bruit and no JVD. Back: No CVA tenderness. Lungs: b/l air entry- crepts bases- rt chest tube Heart: s1s2 Abdomen: Soft, non-tender,not distended. Bowel sounds normal. No masses Foley cath Extremities:left forearm dilaysis fistula B/l leg edema Skin: No rashes or lesions. Or bruising Lymph: Cervical, supraclavicular normal. Neurologic: Grossly non-focal Pertinent Labs Lab Results CBC     Component Value Date/Time   WBC 10.3 01/25/2022 0439   RBC 2.63 (L)  01/25/2022 0439   HGB 8.1 (L) 01/25/2022 0439   HGB 9.5 (L) 04/08/2015 2133   HCT 24.2 (L) 01/25/2022 0439   HCT 28.5 (L) 04/08/2015 2133   PLT 189 01/25/2022 0439   PLT 302 04/08/2015 2133   MCV 92.0 01/25/2022 0439   MCV 91 04/08/2015 2133   MCH 30.8 01/25/2022 0439   MCHC 33.5 01/25/2022 0439   RDW 17.2 (H) 01/25/2022 0439   RDW 13.0 04/08/2015 2133   LYMPHSABS 1.3 01/24/2022 0627   LYMPHSABS 1.7 09/02/2013 0358   MONOABS 1.1 (H) 01/24/2022 0627   MONOABS 0.7 09/02/2013 0358   EOSABS 0.2 01/24/2022 0627   EOSABS 0.2 09/02/2013 0358   BASOSABS 0.1 01/24/2022 0627   BASOSABS 0.1 09/02/2013 0358    CMP Latest Ref Rng & Units 01/24/2022 01/23/2022 01/22/2022  Glucose 70 - 99 mg/dL 85 123(H) 103(H)  BUN 8 - 23 mg/dL 46(H) 39(H) 74(H)  Creatinine 0.61 - 1.24 mg/dL 6.63(H) 5.17(H) 7.52(H)  Sodium 135 - 145 mmol/L 135 133(L) 130(L)  Potassium 3.5 - 5.1 mmol/L 4.1 3.7 4.3  Chloride 98 - 111 mmol/L 99 98 97(L)  CO2 22 - 32 mmol/L 23 26 23   Calcium 8.9 - 10.3 mg/dL 7.1(L) 7.7(L) 7.8(L)  Total Protein 6.5 -  8.1 g/dL 5.5(L) - -  Total Bilirubin 0.3 - 1.2 mg/dL 1.4(H) - -  Alkaline Phos 38 - 126 U/L 331(H) - -  AST 15 - 41 U/L 25 - -  ALT 0 - 44 U/L 13 - -      Microbiology: Recent Results (from the past 240 hour(s))  Urine Culture     Status: Abnormal   Collection Time: 01/21/22 10:25 AM   Specimen: Urine, Random  Result Value Ref Range Status   Specimen Description   Final    URINE, RANDOM Performed at [facility], [address] [place], [place] [address]    Special Requests   Final    NONE Performed at [facility], [address] [place], [place] [address]    Culture MULTIPLE SPECIES PRESENT, SUGGEST RECOLLECTION (A)  Final   Report Status 01/23/2022 FINAL  Final  Blood Culture (routine x 2)     Status: None (Preliminary result)   Collection Time: 01/21/22  1:12 PM   Specimen: BLOOD  Result Value Ref Range Status   Specimen Description BLOOD  BLOOD RIGHT ARM  Final   Special Requests   Final    BOTTLES DRAWN AEROBIC AND ANAEROBIC Blood Culture adequate volume   Culture   Final    NO GROWTH 4 DAYS Performed at [facility], [address] [place], [place] [address]    Report Status PENDING  Incomplete  Blood Culture (routine x 2)     Status: None (Preliminary result)   Collection Time: 01/21/22  1:12 PM   Specimen: BLOOD  Result Value Ref Range Status   Specimen Description BLOOD BLOOD RIGHT FOREARM  Final   Special Requests   Final    BOTTLES DRAWN AEROBIC AND ANAEROBIC Blood Culture adequate volume   Culture   Final    NO GROWTH 4 DAYS Performed at [facility], [address], [place], [place] [address]    Report Status PENDING  Incomplete  Acid Fast Smear (AFB)     Status: None   Collection Time: 01/21/22  4:02 PM   Specimen: Chest; Body Fluid  Result Value Ref Range Status   AFB Specimen Processing Concentration  Final   Acid Fast Smear Negative  Final    Comment: (NOTE) Performed At: [facility] [address] [place], [place] [id number] Rush Farmer MD EZ:[id number]    Source (AFB) NONE  Final    Comment: Performed at [facility], [place] [address] [place], [place] [address]  Body fluid culture w Gram Stain     Status: None   Collection Time: 01/21/22  4:02 PM   Specimen: Chest; Body Fluid  Result Value Ref Range Status   Specimen Description   Final    CHEST FLUID Performed at [facility], [place] [address] [place], [place] [address]    Special Requests   Final    NONE Performed at [facility], [address] [place], [place] [address]    Gram Stain   Final    NO WBC SEEN NO ORGANISMS SEEN Performed at [facility], [place] [address], [place],  [address]    Culture   Final    FEW ESCHERICHIA COLI Confirmed Extended Spectrum Beta-Lactamase Producer (ESBL).  In bloodstream infections from ESBL organisms, carbapenems are preferred over  piperacillin/tazobactam. They are shown to have a lower risk of mortality.    Report Status 01/25/2022 FINAL  Final   Organism ID, Bacteria  ESCHERICHIA COLI  Final      Susceptibility   Escherichia coli - MIC*    AMPICILLIN >=32 RESISTANT Resistant     CEFAZOLIN >=64 RESISTANT Resistant     CEFEPIME 16 RESISTANT Resistant     CEFTAZIDIME RESISTANT Resistant     CEFTRIAXONE >=64 RESISTANT Resistant     CIPROFLOXACIN >=4 RESISTANT Resistant     GENTAMICIN >=16 RESISTANT Resistant     IMIPENEM <=0.25 SENSITIVE Sensitive     TRIMETH/SULFA >=320 RESISTANT Resistant     AMPICILLIN/SULBACTAM >=32 RESISTANT Resistant     PIP/TAZO 8 SENSITIVE Sensitive     * FEW ESCHERICHIA COLI  Aerobic/Anaerobic Culture w Gram Stain (surgical/deep wound)     Status: None (Preliminary result)   Collection Time: 01/22/22  3:56 PM   Specimen: Pleural Fluid  Result Value Ref Range Status   Specimen Description   Final    PLEURAL RIGHT Performed at [facility], [address], [place], [place] [address]    Special Requests   Final    PLE FLU Performed at [facility], [address] [place], [place] [address]    Gram Stain   Final    RARE WBC PRESENT, PREDOMINANTLY MONONUCLEAR NO ORGANISMS SEEN    Culture   Final    NO GROWTH 2 DAYS Performed at [facility], [place] [address], [place], [place] [address]    Report Status PENDING  Incomplete  Resp Panel by RT-PCR (Flu A&B, Covid) Nasopharyngeal Swab     Status: None   Collection Time: 01/23/22  4:52 PM   Specimen: Nasopharyngeal Swab; Nasopharyngeal(NP) swabs in vial transport medium  Result Value Ref Range Status   SARS Coronavirus 2 by RT PCR NEGATIVE NEGATIVE Final    Comment: (NOTE) SARS-CoV-2 target nucleic acids are NOT DETECTED.  The SARS-CoV-2 RNA is generally detectable in upper respiratory specimens during the acute phase of infection. The lowest concentration of SARS-CoV-2 viral copies this assay can detect  is 138 copies/mL. A negative result does not preclude SARS-Cov-2 infection and should not be used as the sole basis for treatment or other patient management decisions. A negative result may occur with  improper specimen collection/handling, submission of specimen other than nasopharyngeal swab, presence of viral mutation(s) within the areas targeted by this assay, and inadequate number of viral copies(<138 copies/mL). A negative result must be combined with clinical observations, patient history, and epidemiological information. The expected result is Negative.  Fact Sheet for Patients:  EntrepreneurPulse.com.au  Fact Sheet for Healthcare Providers:  IncredibleEmployment.be  This test is no t yet approved or cleared by the [place] FDA and  has been authorized for detection and/or diagnosis of SARS-CoV-2 by FDA under an Emergency Use Authorization (EUA). This EUA will remain  in effect (meaning this test can be used) for the duration of the COVID-19 declaration under Section 564(b)(1) of the Act, 21 U.S.C.section 360bbb-3(b)(1), unless the authorization is terminated  or revoked sooner.       Influenza A by PCR NEGATIVE NEGATIVE Final   Influenza B by PCR NEGATIVE NEGATIVE Final    Comment: (NOTE) The Xpert Xpress SARS-CoV-2/FLU/RSV plus assay is intended as an aid in the diagnosis of influenza from Nasopharyngeal swab specimens and should not be used as a sole basis for treatment. Nasal washings and aspirates are unacceptable for Xpert Xpress SARS-CoV-2/FLU/RSV testing.  Fact Sheet for Patients: EntrepreneurPulse.com.au  Fact Sheet for Healthcare Providers: IncredibleEmployment.be  This test is not yet approved or cleared  by the [place] and has been authorized for detection and/or diagnosis of SARS-CoV-2 by FDA under an Emergency Use Authorization (EUA). This EUA will remain in effect  (meaning this test can be used) for the duration of the COVID-19 declaration under Section 564(b)(1) of the Act, 21 U.S.C. section 360bbb-3(b)(1), unless the authorization is terminated or revoked.  Performed at [facility], [address] [place], [place] [address]     IMAGING RESULTS:  I have personally reviewed the films ? Loculated rt pleural effusion Impression/Recommendation ? 81 y.o. male with a history of ESRD on HD, HTN, DM, presents from home on 01/21/22 with lower abdominal pain for 2 weeks and difficulty voiding for 2 weeks   Prostatitis /Prostate abscess vs malignancy E.coli in urine culture Await susceptibility Continue meropenem  Rt loculated pleural effusion- the cell count is only 46 which is not indicative of empyema- but LDH was not sen. Protein is increased like an exudate.  There is no RBC reported  in the pleural fluid analysis  . Also fluid in the tube I serous Why would this be a hemothorax? The ESBL ecoli was present in culture from th first aspiration and not the 2nd culture from te tube Also gram stain no organism- so not sure whether this is a true pathogen- but if the UC is also esbl ecoli then would be inclined to treat it for 4 weeks Pt is also getting tested for TB  ESRD on dialysis Anemia of chronic disease CHF   ? ___________________________________________________ Discussed with patient, daughter and requesting provider ID will follow him peripherally this weekend Note:  This document was prepared using Dragon voice recognition software and may include unintentional dictation errors.

## 2022-01-25 NOTE — Progress Notes (Signed)
[facility]  ROUNDING NOTE   Subjective:   [name] was admitted to [facility] on 01/21/2022 for Urinary retention [R33.9] Back pain [M54.9] UTI (urinary tract infection) [N39.0] Pleural effusion [J90] Pleural effusion on right [J90] Acute cystitis with hematuria [N30.01] Chest pain, unspecified type [R07.9] Sepsis, due to unspecified organism, unspecified whether acute organ dysfunction present [facility]) [A41.9] Acute cough [R05.1]  Last hemodialysis treatment was Saturday, February 4. Left at 72.5kg - below his dry weight.  History is difficult to get even with [place] Interpreter.   Patient seen sitting up in chair, family at bedside Complains of left abdominal pain and bladder discomfort. Poor appetite Denies shortness of breath   Objective:  Vital signs in last 24 hours:  Temp:  [97.7 F (36.5 C)-98.7 F (37.1 C)] 98.1 F (36.7 C) (02/10 1142) Pulse Rate:  [39-81] 63 (02/10 1142) Resp:  [12-34] 15 (02/10 1142) BP: (106-131)/(40-68) 131/64 (02/10 1142) SpO2:  [79 %-100 %] 96 % (02/10 1142) Weight:  [76.6 kg] 76.6 kg (02/10 0200)  Weight change: 0 kg Filed Weights   01/24/22 0925 01/24/22 1306 01/25/22 0200  Weight: 77 kg 75.7 kg 76.6 kg    Intake/Output: I/O last 3 completed shifts: In: 1081.7 [P.O.:60; I.V.:991.7; IV Piggyback:30] Out: 170 [Urine:150; Chest Tube:20]   Intake/Output this shift:  No intake/output data recorded.  Physical Exam: General: NAD,resting in bed  Head: Normocephalic, atraumatic. Moist oral mucosal membranes  Eyes: Anicteric  Lungs:  Basilar rales, normal effort, cough, Right sided Chest tube  Heart: Regular rate and rhythm  Abdomen:  Suprapubic tenderness  Extremities:  1+ peripheral edema.  Neurologic: Nonfocal, moving all four extremities  Skin: No lesions   Access: Left AVF  GU Foley  Basic Metabolic Panel: Recent Labs  Lab 01/21/22 1025 01/22/22 0424 01/23/22 0434 01/24/22 0627  NA 130* 130*  133* 135  K 3.9 4.3 3.7 4.1  CL 96* 97* 98 99  CO2 24 23 26 23   GLUCOSE 121* 103* 123* 85  BUN 69* 74* 39* 46*  CREATININE 6.52* 7.52* 5.17* 6.63*  CALCIUM 7.9* 7.8* 7.7* 7.1*  MG  --   --   --  1.7  PHOS  --   --  2.7  --      Liver Function Tests: Recent Labs  Lab 01/21/22 1025 01/23/22 0434 01/24/22 0627  AST 52*  --  25  ALT 20  --  13  ALKPHOS 443*  --  331*  BILITOT 1.6*  --  1.4*  PROT 6.3*  --  5.5*  ALBUMIN 1.9* 1.8* 1.6*    Recent Labs  Lab 01/21/22 1025  LIPASE 24    Recent Labs  Lab 01/23/22 0956 01/24/22 0627  AMMONIA 37* 41*     CBC: Recent Labs  Lab 01/21/22 1025 01/22/22 0424 01/22/22 1029 01/23/22 0434 01/24/22 0627 01/25/22 0439  WBC 12.1* 8.1  --  11.0* 15.1* 10.3  NEUTROABS 10.2*  --   --   --  12.3*  --   HGB 7.7* 7.2* 7.2* 8.5* 8.0* 8.1*  HCT 23.8* 21.7* 21.8* 26.0* 24.0* 24.2*  MCV 97.9 94.8  --  94.2 92.7 92.0  PLT 226 216  --  198 176 189     Cardiac Enzymes: No results for input(s): CKTOTAL, CKMB, CKMBINDEX, TROPONINI in the last 168 hours.  BNP: Invalid input(s): POCBNP  CBG: Recent Labs  Lab 01/24/22 2007 01/24/22 2345 01/25/22 0540 01/25/22 1000 01/25/22 1140  GLUCAP 143* 125* 112* 97 149*  Microbiology: Results for orders placed or performed during the hospital encounter of 01/21/22  Urine Culture     Status: None (Preliminary result)   Collection Time: 01/21/22 10:25 AM   Specimen: Urine, Random  Result Value Ref Range Status   Specimen Description   Final    URINE, RANDOM Performed at [facility], [address], [place], [place] [address]    Special Requests   Final    NONE Performed at [facility], [address] [place], [place] [address]    Culture   Final    CULTURE REINCUBATED FOR BETTER GROWTH CORRECTED ON 02/10 AT 1430: PREVIOUSLY REPORTED AS MULTIPLE SPECIES PRESENT, SUGGEST RECOLLECTION Performed at [facility], [place] [address] [place], [place]  [address]    Report Status PENDING  Incomplete  Blood Culture (routine x 2)     Status: None (Preliminary result)   Collection Time: 01/21/22  1:12 PM   Specimen: BLOOD  Result Value Ref Range Status   Specimen Description BLOOD BLOOD RIGHT ARM  Final   Special Requests   Final    BOTTLES DRAWN AEROBIC AND ANAEROBIC Blood Culture adequate volume   Culture   Final    NO GROWTH 4 DAYS Performed at [facility], [address] [place], [place] [address]    Report Status PENDING  Incomplete  Blood Culture (routine x 2)     Status: None (Preliminary result)   Collection Time: 01/21/22  1:12 PM   Specimen: BLOOD  Result Value Ref Range Status   Specimen Description BLOOD BLOOD RIGHT FOREARM  Final   Special Requests   Final    BOTTLES DRAWN AEROBIC AND ANAEROBIC Blood Culture adequate volume   Culture   Final    NO GROWTH 4 DAYS Performed at [facility], [address], [place], [place] [address]    Report Status PENDING  Incomplete  Acid Fast Smear (AFB)     Status: None   Collection Time: 01/21/22  4:02 PM   Specimen: Chest; Body Fluid  Result Value Ref Range Status   AFB Specimen Processing Concentration  Final   Acid Fast Smear Negative  Final    Comment: (NOTE) Performed At: [facility] [place], [place] [id number] Rush Farmer MD NO:[id number]    Source (AFB) NONE  Final    Comment: Performed at [facility], [place] [address] [place], [place] [address]  Body fluid culture w Gram Stain     Status: None   Collection Time: 01/21/22  4:02 PM   Specimen: Chest; Body Fluid  Result Value Ref Range Status   Specimen Description   Final    CHEST FLUID Performed at [facility], [place] [address] [place], [place] [address]    Special Requests   Final    NONE Performed at [facility], [address] [place], [place] [address]    Gram Stain   Final    NO WBC SEEN NO ORGANISMS SEEN Performed at [facility], [place] [address], [place],  [address]    Culture   Final    FEW ESCHERICHIA COLI Confirmed Extended Spectrum Beta-Lactamase Producer (ESBL).  In bloodstream infections from ESBL organisms, carbapenems are preferred over piperacillin/tazobactam. They are shown to have a lower risk of mortality.    Report Status 01/25/2022 FINAL  Final   Organism ID, Bacteria ESCHERICHIA COLI  Final      Susceptibility  Escherichia coli - MIC*    AMPICILLIN >=32 RESISTANT Resistant     CEFAZOLIN >=64 RESISTANT Resistant     CEFEPIME 16 RESISTANT Resistant     CEFTAZIDIME RESISTANT Resistant     CEFTRIAXONE >=64 RESISTANT Resistant     CIPROFLOXACIN >=4 RESISTANT Resistant     GENTAMICIN >=16 RESISTANT Resistant     IMIPENEM <=0.25 SENSITIVE Sensitive     TRIMETH/SULFA >=320 RESISTANT Resistant     AMPICILLIN/SULBACTAM >=32 RESISTANT Resistant     PIP/TAZO 8 SENSITIVE Sensitive     * FEW ESCHERICHIA COLI  Aerobic/Anaerobic Culture w Gram Stain (surgical/deep wound)     Status: None (Preliminary result)   Collection Time: 01/22/22  3:56 PM   Specimen: Pleural Fluid  Result Value Ref Range Status   Specimen Description   Final    PLEURAL RIGHT Performed at [facility], [address] [place], [place] [address]    Special Requests   Final    PLE FLU Performed at [facility], [address] [place], [place] [address]    Gram Stain   Final    RARE WBC PRESENT, PREDOMINANTLY MONONUCLEAR NO ORGANISMS SEEN    Culture   Final    NO GROWTH 3 DAYS NO ANAEROBES ISOLATED; CULTURE IN PROGRESS FOR 5 DAYS Performed at [facility] [address], [place], [place] [address]    Report Status PENDING  Incomplete  Resp Panel by RT-PCR (Flu A&B, Covid) Nasopharyngeal Swab     Status: None   Collection Time: 01/23/22  4:52 PM   Specimen: Nasopharyngeal Swab; Nasopharyngeal(NP) swabs in vial transport medium  Result Value Ref Range Status   SARS Coronavirus 2 by RT  PCR NEGATIVE NEGATIVE Final    Comment: (NOTE) SARS-CoV-2 target nucleic acids are NOT DETECTED.  The SARS-CoV-2 RNA is generally detectable in upper respiratory specimens during the acute phase of infection. The lowest concentration of SARS-CoV-2 viral copies this assay can detect is 138 copies/mL. A negative result does not preclude SARS-Cov-2 infection and should not be used as the sole basis for treatment or other patient management decisions. A negative result may occur with  improper specimen collection/handling, submission of specimen other than nasopharyngeal swab, presence of viral mutation(s) within the areas targeted by this assay, and inadequate number of viral copies(<138 copies/mL). A negative result must be combined with clinical observations, patient history, and epidemiological information. The expected result is Negative.  Fact Sheet for Patients:  EntrepreneurPulse.com.au  Fact Sheet for Healthcare Providers:  IncredibleEmployment.be  This test is no t yet approved or cleared by the [place] FDA and  has been authorized for detection and/or diagnosis of SARS-CoV-2 by FDA under an Emergency Use Authorization (EUA). This EUA will remain  in effect (meaning this test can be used) for the duration of the COVID-19 declaration under Section 564(b)(1) of the Act, 21 U.S.C.section 360bbb-3(b)(1), unless the authorization is terminated  or revoked sooner.       Influenza A by PCR NEGATIVE NEGATIVE Final   Influenza B by PCR NEGATIVE NEGATIVE Final    Comment: (NOTE) The Xpert Xpress SARS-CoV-2/FLU/RSV plus assay is intended as an aid in the diagnosis of influenza from Nasopharyngeal swab specimens and should not be used as a sole basis for treatment. Nasal washings and aspirates are unacceptable for Xpert Xpress SARS-CoV-2/FLU/RSV testing.  Fact Sheet for Patients: EntrepreneurPulse.com.au  Fact Sheet for  Healthcare Providers: IncredibleEmployment.be  This test is not yet approved or cleared by the  [place] Architectural technologist and has been authorized for detection and/or diagnosis of SARS-CoV-2 by FDA under an Print production planner (EUA). This EUA will remain in effect (meaning this test can be used) for the duration of the COVID-19 declaration under Section 564(b)(1) of the Act, 21 U.S.C. section 360bbb-3(b)(1), unless the authorization is terminated or revoked.  Performed at [facility], [address] [place], [place] [address]     Coagulation Studies: No results for input(s): LABPROT, INR in the last 72 hours.   Urinalysis: No results for input(s): COLORURINE, LABSPEC, PHURINE, GLUCOSEU, HGBUR, BILIRUBINUR, KETONESUR, PROTEINUR, UROBILINOGEN, NITRITE, LEUKOCYTESUR in the last 72 hours.  Invalid input(s): APPERANCEUR     Imaging: DG Chest Port 1 View  Result Date: 01/25/2022 CLINICAL DATA:  Sepsis secondary to urinary tract infection. History of end-stage renal disease. EXAM: PORTABLE CHEST 1 VIEW COMPARISON:  Radiographs 01/23/2022 and 01/22/2022.  CT 02/18/2017. FINDINGS: 0739 hours. Peripheral chest tube inferiorly in the right hemithorax is unchanged in position. Partially loculated right pleural effusion is unchanged from the most recent study. There is no pneumothorax. The heart size and mediastinal contours are stable. There is mildly increased subsegmental atelectasis at the left lung base. IMPRESSION: No significant change in residual partially loculated right pleural effusion following chest tube placement. Increased left basilar atelectasis. Electronically Signed   By: Richardean Sale M.D.   On: 01/25/2022 08:26   DG Chest Port 1 View  Result Date: 01/23/2022 CLINICAL DATA:  Dyspnea R06.00 (ICD-10-CM) EXAM: PORTABLE CHEST 1 VIEW COMPARISON:  February 7, 23. FINDINGS: Interval placement of a right chest tube with tip along the right lateral and  inferior chest. Mildly decreased loculated right pleural effusion. Similar adjacent consolidation and/or atelectasis. Small left pleural effusion with mild overlying left basilar opacities. No visible pneumothorax. Cardiomediastinal silhouette is similar. No evidence of acute osseous abnormality. IMPRESSION: 1. Interval placement of a right chest tube withmildly decreased loculated right pleural effusion. Similar adjacent consolidation and/or atelectasis. 2. Small left pleural effusion with mild overlying left basilar opacities. Electronically Signed   By: Margaretha Sheffield M.D.   On: 01/23/2022 16:40     Medications:    sodium chloride     meropenem (MERREM) IV 500 mg (01/25/22 1141)    sodium chloride   Intravenous Once   allopurinol  50 mg Oral Daily   atorvastatin  20 mg Oral Daily   Chlorhexidine Gluconate Cloth  6 each Topical Q0600   cholecalciferol  1,000 Units Oral Daily   epoetin (EPOGEN/PROCRIT) injection  10,000 Units Intravenous Q T,Th,Sa-HD   famotidine  20 mg Oral Daily   ferrous sulfate  325 mg Oral BID WC   heparin injection (subcutaneous)  5,000 Units Subcutaneous Q8H   lactulose  20 g Oral TID   midodrine  5 mg Oral TID WC   senna-docusate  2 tablet Oral QHS   sodium chloride flush  3 mL Intravenous Q12H   tamsulosin  0.4 mg Oral QPC supper     Assessment/ Plan:  Mr. Padraig Nhan is a 81 y.o. Hispanic male who speaks [place] and limited [place] and [place].  Patient with end stage renal disease on hemodialysis, hypertension, BPH, diabetes mellitus type II, hyperlipidemia who presents to [facility] on 01/21/2022 for Urinary retention [R33.9] Back pain [M54.9] UTI (urinary tract infection) [N39.0] Pleural effusion [J90] Pleural effusion on right [J90] Acute cystitis with hematuria [N30.01] Chest pain, unspecified type [R07.9] Sepsis, due to unspecified organism, unspecified whether acute organ dysfunction present ([place]) [A41.9] Acute  cough [R05.1]   [facility] TTS  [facility] Left AVF 73kg.    End stage renal disease: Continue TTS schedule. Next treatment scheduled for Saturday.   Anemia of chronic kidney disease: hemoglobin 8.1, below desired target - EPO with dialysis treatments   Hypertension: .Home regimen includes hydralazine, metoprolol and tamsulosin.  BP currently 131/64   Secondary Hyperparathyroidism: Continue cholecalciferol daily.   -Calcium decreased, will monitor and correct with HD  5.  Right pleural effusion likely due to rib fracture.  Posttraumatic hemorrhagic pleural effusion.  Right chest tube placed on 01/22/2022.  Testing sent on drainage.  Found to have escherichia coli. Pulmonology following.     LOS: [place] 2/10/20232:59 PM

## 2022-01-25 NOTE — Consult Note (Signed)
Pharmacy Antibiotic Note  Martin Gilbert is a 81 y.o. male admitted on 01/21/2022 with UTI/possible empyema.  Pharmacy has been consulted for vancomycin and meropenem dosing. Pt is ESRD on HD TTS.   Chest body fluid growing ESBL E.coli.   Plan: Vancomycin 2 g IV loading dose, followed by 1 g IV post-HD sessions on TTS. Target predialysis concentrations of 15 to 20 mg/L. Plan to get a random level on 2/11.   Started meropenem 500 mg q24H. Pip/tazo d/c'ed.   MD to call lab and assess if vancomycin is still needed.    Height: 5\' 5"  (165.1 cm) Weight: 76.6 kg (168 lb 14 oz) IBW/kg (Calculated) : 61.5  Temp (24hrs), Avg:98.2 F (36.8 C), Min:97.7 F (36.5 C), Max:98.7 F (37.1 C)  Recent Labs  Lab 01/21/22 1025 01/21/22 1312 01/22/22 0424 01/23/22 0434 01/24/22 0627 01/25/22 0439  WBC 12.1*  --  8.1 11.0* 15.1* 10.3  CREATININE 6.52*  --  7.52* 5.17* 6.63*  --   LATICACIDVEN  --  1.0  --   --   --   --      Estimated Creatinine Clearance: 8.3 mL/min (A) (by C-G formula based on SCr of 6.63 mg/dL (H)).    No Known Allergies  Antimicrobials this admission: 2/6 cefepime x 1 in the ED 2/6 vancomycin >>  2/6 Zosyn >> 2/10 2/10 meropenem >>   Microbiology results: 2/6 BCx: pending 2/6 UCx: pending  2/6 Body fluid cx: esbl e.coli.   Thank you for allowing pharmacy to be a part of this patients care.  Oswald Hillock, PharmD  01/25/2022 10:26 AM

## 2022-01-25 NOTE — Progress Notes (Signed)
Progress Note   Patient: Martin Gilbert OEU:[id number] DOB: 1941-02-24 DOA: 01/21/2022     4 DOS: the patient was seen and examined on 01/25/2022       Brief hospital course: Martin Gilbert is an 81 y.o. M with ESRD on HD, HTN, DM, dCHF who presented with abdominal pain for 3 weeks.  In the ER, urinalysis (still makes significant urine) showed UTI.  Imaging showed low-density area near prostate concerning ofr prostatitis vs abscess.  Urology consulted and admitted on antibiotics.  Also noted to have loculated RIGHT pleural effusion.   2/6: Admitted on antibiotics, had diagnostic thoracentesis 2/7: CT surgery recommended pigtail catheter, placed 2nd day 2/8: Developed delirium 2/10: Chest fluid culture growing E coli      Assessment and Plan: * Acute prostatitis Patient presented with suprapubic pain, difficulty urinating.  CAT scan showed a small lucency, possible prostatitis versus prostatic abscess.  Urology were consulted, recommended medical treatment and outpatient follow-up.  Sepsis was ruled out. - Transition antibiotics to Merrem given ESBL on chest fluid - Follow repeat urine culture and speciation of original culture  - Continue Flomax - Foley placed on admission --> follow-up with urology after discharge       Urinary retention - See above  UTI (urinary tract infection)- (present on admission) - See above  Acute respiratory failure with hypoxia ([place]) At baseline the patient does not use oxygen.  Here he required up to 5 and 6 L of oxygen at times due to effusion, now weaned to room air.     Pleural effusion on right- (present on admission) See analysis by Pulmonology  Only 150 cc fluid output.    Acute metabolic encephalopathy- (present on admission) Likely due to infection, hypoxia.  Hard of hearing and language barrier complicates evaluation.  Seems to be resolving, recognizes family, which he did not the other day.   Chronic diastolic CHF  (congestive heart failure) ([place])- (present on admission) Acute CHF ruled out. - Minimize fluids   Pressure injury of right buttock, stage 2 ([place])- (present on admission) - Mepilex dressing  Elevated troponin Ischemia ruled out.  Due to poor clearance, non-infarction troponin elevation.  Hepatic cirrhosis ([place])- (present on admission) Per imaging.  No clinical signs of cirrhosis, and I do not suspect his confusion is hepatic encephalopathy. -Continue midodrine  Diabetes mellitus, type II ([name]) Glucose controlled adequately -Continue statin  ESRD (end stage renal disease) ([place])- (present on admission) - Consult Nephrology, appreciate cares  Anemia of chronic disease- (present on admission) -Continue iron, EPO          Subjective: History collected through video phonic interpreter. No fever overnight, still very hard of hearing but confusion seems somewhat better, difficult to assess.  Has some pain in his chest tube.  No other complaints.  Physical Exam: Vitals:   01/25/22 0546 01/25/22 0800 01/25/22 1000 01/25/22 1142  BP: (!) 114/54 128/68  131/64  Pulse:  67 67 63  Resp:  16 17 15   Temp:  98 F (36.7 C)  98.1 F (36.7 C)  TempSrc:  Oral  Oral  SpO2:  96% 94% 96%  Weight:      Height:       Frail elderly adult male, lying in bed, no acute distress, appears debilitated and weak Right-sided chest tube, lung sounds diminished on the right, good air movement on the left, atelectatic crackles, no wheezing Heart rate regular, soft systolic murmur, no lower extremity edema, no JVD Mentation is slowed, oriented to "  hospital", and recognizes family otherwise she is disoriented and very weak generalized, with symmetric strength in both sides, face symmetric.      Data Reviewed: Discussed with pulmonology and infectious disease My review of labs and imaging is notable for hemoglobin 8, no change from previous, white blood cell count down to 10   Family  Communication: Wife at the bedside  Disposition: Status is: Inpatient  Remains inpatient appropriate because: He has continued chest tube, need for IV antibiotics.  Long-term prognosis is unclear.    We will need to continue IV antibiotics at least over the weekend, discussed with pulmonology and infectious disease and CT surgery regarding the possibility of VATS to evacuate his loculated pleural effusion.    Planned Discharge Destination: To be determined        Author: Edwin Dada, MD 01/25/2022 5:18 PM  For on call review [email or web address].

## 2022-01-25 NOTE — Assessment & Plan Note (Signed)
See above

## 2022-01-25 NOTE — Progress Notes (Signed)
Pt and family offered the portable interpreter during assessment but pt refused at this time. Will continue to monitor.

## 2022-01-26 DIAGNOSIS — J9601 Acute respiratory failure with hypoxia: Secondary | ICD-10-CM | POA: Diagnosis not present

## 2022-01-26 DIAGNOSIS — M109 Gout, unspecified: Secondary | ICD-10-CM

## 2022-01-26 DIAGNOSIS — D638 Anemia in other chronic diseases classified elsewhere: Secondary | ICD-10-CM | POA: Diagnosis not present

## 2022-01-26 DIAGNOSIS — G9341 Metabolic encephalopathy: Secondary | ICD-10-CM | POA: Diagnosis not present

## 2022-01-26 DIAGNOSIS — N410 Acute prostatitis: Secondary | ICD-10-CM | POA: Diagnosis not present

## 2022-01-26 LAB — BASIC METABOLIC PANEL
Anion gap: 13 (ref 5–15)
BUN: 36 mg/dL — ABNORMAL HIGH (ref 8–23)
CO2: 24 mmol/L (ref 22–32)
Calcium: 7 mg/dL — ABNORMAL LOW (ref 8.9–10.3)
Chloride: 95 mmol/L — ABNORMAL LOW (ref 98–111)
Creatinine, Ser: 6.09 mg/dL — ABNORMAL HIGH (ref 0.61–1.24)
GFR, Estimated: 9 mL/min — ABNORMAL LOW (ref >60)
Glucose, Bld: 98 mg/dL (ref 70–99)
Potassium: 3.7 mmol/L (ref 3.5–5.1)
Sodium: 132 mmol/L — ABNORMAL LOW (ref 135–145)

## 2022-01-26 LAB — CULTURE, BLOOD (ROUTINE X 2)
Culture: NO GROWTH
Culture: NO GROWTH
Special Requests: ADEQUATE
Special Requests: ADEQUATE

## 2022-01-26 LAB — GLUCOSE, CAPILLARY
Glucose-Capillary: 102 mg/dL — ABNORMAL HIGH (ref 70–99)
Glucose-Capillary: 92 mg/dL (ref 70–99)
Glucose-Capillary: 116 mg/dL — ABNORMAL HIGH (ref 70–99)
Glucose-Capillary: 113 mg/dL — ABNORMAL HIGH (ref 70–99)

## 2022-01-26 LAB — CBC
HCT: 25.4 % — ABNORMAL LOW (ref 39.0–52.0)
Hemoglobin: 8.3 g/dL — ABNORMAL LOW (ref 13.0–17.0)
MCH: 30.5 pg (ref 26.0–34.0)
MCHC: 32.7 g/dL (ref 30.0–36.0)
MCV: 93.4 fL (ref 80.0–100.0)
Platelets: 165 10*3/uL (ref 150–400)
RBC: 2.72 MIL/uL — ABNORMAL LOW (ref 4.22–5.81)
RDW: 16.6 % — ABNORMAL HIGH (ref 11.5–15.5)
WBC: 9 10*3/uL (ref 4.0–10.5)
nRBC: 0 % (ref 0.0–0.2)

## 2022-01-26 NOTE — Progress Notes (Signed)
[facility]  ROUNDING NOTE   Subjective:   [name] was admitted to [facility] on 01/21/2022 for Urinary retention [R33.9] Back pain [M54.9] UTI (urinary tract infection) [N39.0] Pleural effusion [J90] Pleural effusion on right [J90] Acute cystitis with hematuria [N30.01] Chest pain, unspecified type [R07.9] Sepsis, due to unspecified organism, unspecified whether acute organ dysfunction present [facility]) [A41.9] Acute cough [R05.1]  Last hemodialysis treatment was Saturday, February 4. Left at 72.5kg - below his dry weight.  History is difficult to get even with [place] Interpreter.   Resting quietly Alert Family at bedside Denies pain and discomfort Denies shortness of breath Tolerating small meals  Objective:  Vital signs in last 24 hours:  Temp:  [97.8 F (36.6 C)-99 F (37.2 C)] 97.8 F (36.6 C) (02/11 0917) Pulse Rate:  [60-73] 61 (02/11 1030) Resp:  [13-30] 20 (02/11 1030) BP: (121-150)/(49-65) 148/55 (02/11 1030) SpO2:  [94 %-99 %] 95 % (02/11 1030) Weight:  [79.1 kg] 79.1 kg (02/11 0920)  Weight change: 2.1 kg Filed Weights   01/25/22 0200 01/26/22 0500 01/26/22 0920  Weight: 76.6 kg 79.1 kg 79.1 kg    Intake/Output: I/O last 3 completed shifts: In: 130 [IV Piggyback:130] Out: 72 [Urine:20; Chest Tube:50]   Intake/Output this shift:  Total I/O In: -  Out: 1 [Stool:1]  Physical Exam: General: NAD,resting in bed  Head: Normocephalic, atraumatic. Moist oral mucosal membranes  Eyes: Anicteric  Lungs:  Basilar rales, normal effort, cough, Right sided Chest tube  Heart: Regular rate and rhythm  Abdomen:  Suprapubic tenderness  Extremities:  1+ peripheral edema.  Neurologic: Nonfocal, moving all four extremities  Skin: No lesions   Access: Left AVF  GU Foley  Basic Metabolic Panel: Recent Labs  Lab 01/21/22 1025 01/22/22 0424 01/23/22 0434 01/24/22 0627 01/26/22 0525  NA 130* 130* 133* 135 132*  K 3.9 4.3 3.7 4.1 3.7   CL 96* 97* 98 99 95*  CO2 24 23 26 23 24   GLUCOSE 121* 103* 123* 85 98  BUN 69* 74* 39* 46* 36*  CREATININE 6.52* 7.52* 5.17* 6.63* 6.09*  CALCIUM 7.9* 7.8* 7.7* 7.1* 7.0*  MG  --   --   --  1.7  --   PHOS  --   --  2.7  --   --      Liver Function Tests: Recent Labs  Lab 01/21/22 1025 01/23/22 0434 01/24/22 0627  AST 52*  --  25  ALT 20  --  13  ALKPHOS 443*  --  331*  BILITOT 1.6*  --  1.4*  PROT 6.3*  --  5.5*  ALBUMIN 1.9* 1.8* 1.6*    Recent Labs  Lab 01/21/22 1025  LIPASE 24    Recent Labs  Lab 01/23/22 0956 01/24/22 0627  AMMONIA 37* 41*     CBC: Recent Labs  Lab 01/21/22 1025 01/22/22 0424 01/22/22 1029 01/23/22 0434 01/24/22 0627 01/25/22 0439 01/26/22 0525  WBC 12.1* 8.1  --  11.0* 15.1* 10.3 9.0  NEUTROABS 10.2*  --   --   --  12.3*  --   --   HGB 7.7* 7.2* 7.2* 8.5* 8.0* 8.1* 8.3*  HCT 23.8* 21.7* 21.8* 26.0* 24.0* 24.2* 25.4*  MCV 97.9 94.8  --  94.2 92.7 92.0 93.4  PLT 226 216  --  198 176 189 165     Cardiac Enzymes: No results for input(s): CKTOTAL, CKMB, CKMBINDEX, TROPONINI in the last 168 hours.  BNP: Invalid input(s): POCBNP  CBG:  Recent Labs  Lab 01/25/22 1806 01/25/22 2023 01/25/22 2358 01/26/22 0354 01/26/22 0825  GLUCAP 109* 134* 114* 102* 57     Microbiology: Results for orders placed or performed during the hospital encounter of 01/21/22  Urine Culture     Status: Abnormal (Preliminary result)   Collection Time: 01/21/22 10:25 AM   Specimen: Urine, Random  Result Value Ref Range Status   Specimen Description   Final    URINE, RANDOM Performed at [facility], [address] [place], [place] [address]    Special Requests   Final    NONE Performed at [facility], [address] [place], [place] [address]    Culture (A)  Final    >=100,000 COLONIES/mL ESCHERICHIA COLI CORRECTED ON 02/10 AT 1430: PREVIOUSLY REPORTED AS MULTIPLE SPECIES PRESENT, SUGGEST RECOLLECTION CULTURE  REINCUBATED FOR BETTER GROWTH Performed at [facility], [place] [address] [place], [place] [address]    Report Status PENDING  Incomplete  Blood Culture (routine x 2)     Status: None   Collection Time: 01/21/22  1:12 PM   Specimen: BLOOD  Result Value Ref Range Status   Specimen Description BLOOD BLOOD RIGHT ARM  Final   Special Requests   Final    BOTTLES DRAWN AEROBIC AND ANAEROBIC Blood Culture adequate volume   Culture   Final    NO GROWTH 5 DAYS Performed at [facility], [address] [place], [place] [address]    Report Status 01/26/2022 FINAL  Final  Blood Culture (routine x 2)     Status: None   Collection Time: 01/21/22  1:12 PM   Specimen: BLOOD  Result Value Ref Range Status   Specimen Description BLOOD BLOOD RIGHT FOREARM  Final   Special Requests   Final    BOTTLES DRAWN AEROBIC AND ANAEROBIC Blood Culture adequate volume   Culture   Final    NO GROWTH 5 DAYS Performed at [facility], [address] [place], [place] [address]    Report Status 01/26/2022 FINAL  Final  Acid Fast Smear (AFB)     Status: None   Collection Time: 01/21/22  4:02 PM   Specimen: Chest; Body Fluid  Result Value Ref Range Status   AFB Specimen Processing Concentration  Final   Acid Fast Smear Negative  Final    Comment: (NOTE) Performed At: [facility] [place], [place] [id number] Rush Farmer MD [id number]    Source (AFB) NONE  Final    Comment: Performed at [facility], [place] [address] [place], [place] [address]  Body fluid culture w Gram Stain     Status: None   Collection Time: 01/21/22  4:02 PM   Specimen: Chest; Body Fluid  Result Value Ref Range Status   Specimen Description   Final    CHEST FLUID Performed at [facility], [place] [address] [place], [place] [address]    Special Requests   Final    NONE Performed at [facility], [address] [place], [place] [address]    Gram Stain   Final     NO WBC SEEN NO ORGANISMS SEEN Performed at [facility], [place] [address] [place], [place] [address]    Culture   Final    FEW ESCHERICHIA COLI Confirmed Extended Spectrum Beta-Lactamase Producer (ESBL).  In bloodstream infections from ESBL organisms, carbapenems are preferred over piperacillin/tazobactam. They are shown to have a lower risk of  mortality.    Report Status 01/25/2022 FINAL  Final   Organism ID, Bacteria ESCHERICHIA COLI  Final      Susceptibility   Escherichia coli - MIC*    AMPICILLIN >=32 RESISTANT Resistant     CEFAZOLIN >=64 RESISTANT Resistant     CEFEPIME 16 RESISTANT Resistant     CEFTAZIDIME RESISTANT Resistant     CEFTRIAXONE >=64 RESISTANT Resistant     CIPROFLOXACIN >=4 RESISTANT Resistant     GENTAMICIN >=16 RESISTANT Resistant     IMIPENEM <=0.25 SENSITIVE Sensitive     TRIMETH/SULFA >=320 RESISTANT Resistant     AMPICILLIN/SULBACTAM >=32 RESISTANT Resistant     PIP/TAZO 8 SENSITIVE Sensitive     * FEW ESCHERICHIA COLI  Aerobic/Anaerobic Culture w Gram Stain (surgical/deep wound)     Status: None (Preliminary result)   Collection Time: 01/22/22  3:56 PM   Specimen: Pleural Fluid  Result Value Ref Range Status   Specimen Description   Final    PLEURAL RIGHT Performed at [facility], [address], [place], [place] [address]    Special Requests   Final    PLE FLU Performed at [facility], [place] [place], [place] [address]    Gram Stain   Final    RARE WBC PRESENT, PREDOMINANTLY MONONUCLEAR NO ORGANISMS SEEN    Culture   Final    NO GROWTH 3 DAYS NO ANAEROBES ISOLATED; CULTURE IN PROGRESS FOR 5 DAYS Performed at [place] [address], [place], [place] [address]    Report Status PENDING  Incomplete  Resp Panel by RT-PCR (Flu A&B, Covid) Nasopharyngeal Swab     Status: None   Collection Time: 01/23/22  4:52 PM   Specimen: Nasopharyngeal Swab; Nasopharyngeal(NP) swabs in vial transport medium   Result Value Ref Range Status   SARS Coronavirus 2 by RT PCR NEGATIVE NEGATIVE Final    Comment: (NOTE) SARS-CoV-2 target nucleic acids are NOT DETECTED.  The SARS-CoV-2 RNA is generally detectable in upper respiratory specimens during the acute phase of infection. The lowest concentration of SARS-CoV-2 viral copies this assay can detect is 138 copies/mL. A negative result does not preclude SARS-Cov-2 infection and should not be used as the sole basis for treatment or other patient management decisions. A negative result may occur with  improper specimen collection/handling, submission of specimen other than nasopharyngeal swab, presence of viral mutation(s) within the areas targeted by this assay, and inadequate number of viral copies(<138 copies/mL). A negative result must be combined with clinical observations, patient history, and epidemiological information. The expected result is Negative.  Fact Sheet for Patients:  EntrepreneurPulse.com.au  Fact Sheet for Healthcare Providers:  IncredibleEmployment.be  This test is no t yet approved or cleared by the [place] FDA and  has been authorized for detection and/or diagnosis of SARS-CoV-2 by FDA under an Emergency Use Authorization (EUA). This EUA will remain  in effect (meaning this test can be used) for the duration of the COVID-19 declaration under Section 564(b)(1) of the Act, 21 U.S.C.section 360bbb-3(b)(1), unless the authorization is terminated  or revoked sooner.       Influenza A by PCR NEGATIVE NEGATIVE Final   Influenza B by PCR NEGATIVE NEGATIVE Final    Comment: (NOTE) The Xpert Xpress SARS-CoV-2/FLU/RSV plus assay is intended as an aid in the diagnosis of influenza from Nasopharyngeal swab specimens and should not be used as a sole basis for treatment. Nasal washings and aspirates are unacceptable for Xpert Xpress  SARS-CoV-2/FLU/RSV testing.  Fact Sheet for  Patients: EntrepreneurPulse.com.au  Fact Sheet for Healthcare Providers: IncredibleEmployment.be  This test is not yet approved or cleared by the [place] FDA and has been authorized for detection and/or diagnosis of SARS-CoV-2 by FDA under an Emergency Use Authorization (EUA). This EUA will remain in effect (meaning this test can be used) for the duration of the COVID-19 declaration under Section 564(b)(1) of the Act, 21 U.S.C. section 360bbb-3(b)(1), unless the authorization is terminated or revoked.  Performed at [facility], [address] [place], [place] [address]   Aerobic/Anaerobic Culture w Gram Stain (surgical/deep wound)     Status: None (Preliminary result)   Collection Time: 01/25/22 12:00 PM   Specimen: Pleura  Result Value Ref Range Status   Specimen Description   Final    PLEURAL Performed at [facility], [address] [place], [place] [address]    Special Requests   Final    RIGHT CHEST TUBE Performed at [facility], [address] [place], [place] [address]    Gram Stain   Final    NO WBC SEEN NO ORGANISMS SEEN Performed at [facility], [place] [address] [place], [place] [address]    Culture PENDING  Incomplete   Report Status PENDING  Incomplete    Coagulation Studies: No results for input(s): LABPROT, INR in the last 72 hours.   Urinalysis: No results for input(s): COLORURINE, LABSPEC, PHURINE, GLUCOSEU, HGBUR, BILIRUBINUR, KETONESUR, PROTEINUR, UROBILINOGEN, NITRITE, LEUKOCYTESUR in the last 72 hours.  Invalid input(s): APPERANCEUR     Imaging: DG Chest Port 1 View  Result Date: 01/25/2022 CLINICAL DATA:  Sepsis secondary to urinary tract infection. History of end-stage renal disease. EXAM: PORTABLE CHEST 1 VIEW COMPARISON:  Radiographs 01/23/2022 and 01/22/2022.  CT 02/18/2017. FINDINGS: 0739 hours. Peripheral chest tube inferiorly in the right hemithorax is  unchanged in position. Partially loculated right pleural effusion is unchanged from the most recent study. There is no pneumothorax. The heart size and mediastinal contours are stable. There is mildly increased subsegmental atelectasis at the left lung base. IMPRESSION: No significant change in residual partially loculated right pleural effusion following chest tube placement. Increased left basilar atelectasis. Electronically Signed   By: Richardean Sale M.D.   On: 01/25/2022 08:26     Medications:    sodium chloride     meropenem (MERREM) IV 500 mg (01/25/22 1141)    sodium chloride   Intravenous Once   allopurinol  50 mg Oral Daily   atorvastatin  20 mg Oral Daily   Chlorhexidine Gluconate Cloth  6 each Topical Q0600   cholecalciferol  1,000 Units Oral Daily   epoetin (EPOGEN/PROCRIT) injection  10,000 Units Intravenous Q T,Th,Sa-HD   famotidine  20 mg Oral Daily   ferrous sulfate  325 mg Oral BID WC   heparin injection (subcutaneous)  5,000 Units Subcutaneous Q8H   lactulose  20 g Oral TID   midodrine  5 mg Oral TID WC   senna-docusate  2 tablet Oral QHS   sodium chloride flush  3 mL Intravenous Q12H   tamsulosin  0.4 mg Oral QPC supper     Assessment/ Plan:  [name] is a 81 y.o. Hispanic male who speaks [place] and limited [place] and [place].  Patient with end stage renal disease on hemodialysis, hypertension, BPH, diabetes mellitus type II, hyperlipidemia who presents to [facility] on 01/21/2022 for Urinary retention [R33.9] Back pain [M54.9] UTI (urinary tract infection) [N39.0] Pleural effusion [  J90] Pleural effusion on right [J90] Acute cystitis with hematuria [N30.01] Chest pain, unspecified type [R07.9] Sepsis, due to unspecified organism, unspecified whether acute organ dysfunction present ([place]) [A41.9] Acute cough [R05.1]   [facility] TTS [facility] Left AVF 73kg.    End stage renal disease: Continue TTS schedule. Preparing to receive dialysis at bedside  due to isolation status. UF goal 0.5-1L as tolerated. Next treatment scheduled for Tuesday.    Anemia of chronic kidney disease: hemoglobin 8.3, below desired target - EPO with dialysis treatments   Hypertension: .Home regimen includes hydralazine, metoprolol and tamsulosin.  BP 148/55   Secondary Hyperparathyroidism: Continue cholecalciferol daily.   -Calcium remains decreased at 7.0. Will continue to monitor  5.  Right pleural effusion likely due to rib fracture.  Posttraumatic hemorrhagic pleural effusion.  Right chest tube placed on 01/22/2022.  Testing sent on drainage.  Found to have escherichia coli. Pulmonology and ID  following. Started on Meropenem.     LOS: [place] 2/11/202310:37 AM

## 2022-01-26 NOTE — Progress Notes (Signed)
Hemodialysis notes  HD treatment completed, tolerated well. Pt has no complaints, pt asymptomatic. Total treatment time is 2.5 hours. Total UF net removed is 1L.

## 2022-01-26 NOTE — Progress Notes (Signed)
Pt was instructed with Flutter valve and incentive spirometry with familly member translating. Pt demonstrated well how to use each device. Pt has wet cough but is non productive. Family stated that they would get him to use incentive and flutter each hour for 10 times.

## 2022-01-26 NOTE — Progress Notes (Signed)
Progress Note   Patient: Chrisangel Eskenazi XBD:[id number] DOB: 03/04/1941 DOA: 01/21/2022     5 DOS: the patient was seen and examined on 01/26/2022       Brief hospital course: [name] is an 81 y.o. M with ESRD on HD, HTN, DM, dCHF who presented with abdominal pain for 3 weeks.  In the ER, urinalysis (still makes significant urine) showed UTI.  Imaging showed low-density area near prostate concerning ofr prostatitis vs abscess.  Urology consulted and admitted on antibiotics.  Also noted to have loculated RIGHT pleural effusion.   2/6: Admitted on antibiotics, had diagnostic thoracentesis 2/7: CT surgery recommended pigtail catheter, placed 2nd day 2/8: Developed delirium 2/10: Chest fluid culture growing ESBL E coli 2/11: Delirium completely resolved      Assessment and Plan: Assessment and Plan: * Acute prostatitis Patient presented with suprapubic pain, difficulty urinating.  CAT scan showed a small lucency, possible prostatitis versus prostatic abscess.  Urology were consulted, recommended medical treatment and outpatient follow-up.  Sepsis was ruled out. - Continue meropenem - Follow repeat urine culture and speciation of original culture  - Continue Flomax - Foley placed on admission --> follow-up with urology after discharge      Sepsis ([name])-resolved as of 01/24/2022, (present on admission) Due to complicated UTI with acute urinary retention, possibly prostatitis.  Urinary retention - See above  UTI (urinary tract infection)- (present on admission) - See above  Acute respiratory failure with hypoxia ([name]) At baseline the patient does not use oxygen.  Here he required up to 5 and 6 L of oxygen at times due to effusion, now weaned to room air.     Pleural effusion on right- (present on admission) See analysis by Pulmonology  <50cc output total in the last 48 hrs   I spoke with CT surgery, not a surgical candidate.  They recommend lytics. - Consult  Pulmonlogy  Acute metabolic encephalopathy- (present on admission) Likely due to infection, hypoxia.  Hard of hearing and language barrier complicates evaluation.  Seems completely resolved today.   Chronic diastolic CHF (congestive heart failure) ([place])- (present on admission) Acute CHF ruled out. - Minimize fluids   Gout -Continue allopurinol  Pressure injury of right buttock, stage 2 ([place])- (present on admission) - Mepilex dressing  Elevated troponin Ischemia ruled out.  Due to poor clearance, non-infarction troponin elevation.  Hepatic cirrhosis ([place])- (present on admission) Per imaging.  No clinical signs of cirrhosis, and I do not suspect his confusion is hepatic encephalopathy. -Continue midodrine  Diabetes mellitus, type II ([name]) Glucose controlled adequately -Continue statin  ESRD (end stage renal disease) ([place])- (present on admission) - Consult Nephrology, appreciate cares  Anemia of chronic disease- (present on admission) -Continue iron, EPO              Subjective: History collected through video phonic interpreter.  They recommend using a [employer] interpreter by calling the [employer] network customer service at [phone]  Today he has no headache, chest pain, dyspnea, back pain, flank pain, pelvic pain.  He only feels like he has to pee.     Physical Exam: Vitals:   01/26/22 1215 01/26/22 1230 01/26/22 1245 01/26/22 1252  BP: (!) 144/51 (!) 146/55 (!) 143/50   Pulse: 61 62 (!) 58 61  Resp: (!) 21 15 18 15   Temp:    98.1 F (36.7 C)  TempSrc:    Oral  SpO2: 96% 100% 94% 96%  Weight:    76 kg  Height:  Frail elderly adult male, lying in bed, being fed by family Right-sided chest tube, lung sounds diminished on the right, good air movement on the left, atelectatic crackles, no wheezing Heart rate regular, soft systolic murmur, no lower extremity edema, no JVD He is alert and responsive to questions, oriented to family, day of the week,  "hospital, "[place]. He has generalized weakness, but symmetric strength, speech fluent, face symmetric       Data Reviewed: Discussed with cardiothoracic surgery and pulmonology Hemoglobin 8, no change from previous White blood cell count normal. Patient metabolic panel notable for mild hyponatremia   Family Communication: Family we are awaiting urine culture.  In the time we have his urine culture we can determine a long-term IV antibiotic treatment plan, likely discharge to home on Monday at the bedside  Disposition: Status is: Inpatient  Remains inpatient appropriate because: He has continued chest tube, need for IV antibiotics.  Long-term prognosis is unclear.    We will need to continue IV antibiotics at least over the weekend, discussed with pulmonology and infectious disease and CT surgery regarding the possibility of VATS to evacuate his loculated pleural effusion.    Planned Discharge Destination: To be determined       Author: Edwin Dada, MD 01/26/2022 2:16 PM  For on call review [email or web address].

## 2022-01-26 NOTE — Assessment & Plan Note (Signed)
Continue allopurinol 

## 2022-01-27 DIAGNOSIS — N410 Acute prostatitis: Secondary | ICD-10-CM | POA: Diagnosis not present

## 2022-01-27 DIAGNOSIS — R778 Other specified abnormalities of plasma proteins: Secondary | ICD-10-CM

## 2022-01-27 DIAGNOSIS — G9341 Metabolic encephalopathy: Secondary | ICD-10-CM | POA: Diagnosis not present

## 2022-01-27 DIAGNOSIS — N3001 Acute cystitis with hematuria: Secondary | ICD-10-CM | POA: Diagnosis not present

## 2022-01-27 DIAGNOSIS — J942 Hemothorax: Secondary | ICD-10-CM

## 2022-01-27 DIAGNOSIS — J90 Pleural effusion, not elsewhere classified: Secondary | ICD-10-CM | POA: Diagnosis not present

## 2022-01-27 LAB — GLUCOSE, CAPILLARY
Glucose-Capillary: 105 mg/dL — ABNORMAL HIGH (ref 70–99)
Glucose-Capillary: 87 mg/dL (ref 70–99)
Glucose-Capillary: 116 mg/dL — ABNORMAL HIGH (ref 70–99)
Glucose-Capillary: 97 mg/dL (ref 70–99)
Glucose-Capillary: 148 mg/dL — ABNORMAL HIGH (ref 70–99)

## 2022-01-27 LAB — ACID FAST SMEAR (AFB, MYCOBACTERIA): Acid Fast Smear: NEGATIVE

## 2022-01-27 NOTE — Progress Notes (Addendum)
Pulmonary Medicine          Date: 01/27/2022,   MRN# [id number] Martin Gilbert Apr 30, 1941       HISTORY OF PRESENT ILLNESS   Family in room. They have been briefed on the need for lytic lysis. After over night consideration they have decided to not go ahead with lytic lysis. Dis show family the xrays, explain in detail the process and the risk and benefit of the procedure. Also there is a possibility the fluid may be infected. The ESBL ecoli was present in culture from th first aspiration and not the 2nd culture from te tube  He is more spontaneous. Has a cough, brown urine. Eating breakfast.    PAST MEDICAL HISTORY   Past Medical History:  Diagnosis Date   Anemia    Arthritis    Back pain    BPH (benign prostatic hyperplasia)    CKD (chronic kidney disease), stage IV ([name])    Clostridium difficile colitis    Diabetes mellitus without complication ([name])    GERD (gastroesophageal reflux disease)    HTN (hypertension)    Hyperlipidemia      SURGICAL HISTORY   Past Surgical History:  Procedure Laterality Date   A/V FISTULAGRAM Left 04/20/2018   Procedure: A/V FISTULAGRAM;  Surgeon: Algernon Huxley, MD;  Location: [facility];  Service: Cardiovascular;  Laterality: Left;   A/V FISTULAGRAM Left 11/25/2018   Procedure: A/V FISTULAGRAM;  Surgeon: Algernon Huxley, MD;  Location: [facility];  Service: Cardiovascular;  Laterality: Left;   A/V FISTULAGRAM Left 01/11/2019   Procedure: A/V FISTULAGRAM;  Surgeon: Algernon Huxley, MD;  Location: [facility];  Service: Cardiovascular;  Laterality: Left;   AV FISTULA PLACEMENT Left 06/13/2017   Procedure: ARTERIOVENOUS (AV) FISTULA CREATION ( RADIAL CEPHALIC );  Surgeon: Katha Cabal, MD;  Location: [facility];  Service: Vascular;  Laterality: Left;   CATARACT EXTRACTION     one eye not sure which   DIALYSIS/PERMA CATHETER INSERTION N/A 04/03/2017   Procedure: Dialysis/Perma Catheter Insertion;   Surgeon: Algernon Huxley, MD;  Location: [facility];  Service: Cardiovascular;  Laterality: N/A;   DIALYSIS/PERMA CATHETER INSERTION N/A 08/06/2017   Procedure: DIALYSIS/PERMA CATHETER INSERTION;  Surgeon: Algernon Huxley, MD;  Location: [facility];  Service: Cardiovascular;  Laterality: N/A;   DIALYSIS/PERMA CATHETER REMOVAL N/A 08/04/2017   Procedure: DIALYSIS/PERMA CATHETER REMOVAL;  Surgeon: Algernon Huxley, MD;  Location: [facility];  Service: Cardiovascular;  Laterality: N/A;   DIALYSIS/PERMA CATHETER REMOVAL N/A 12/31/2017   Procedure: DIALYSIS/PERMA CATHETER REMOVAL;  Surgeon: Algernon Huxley, MD;  Location: [facility];  Service: Cardiovascular;  Laterality: N/A;   hernia repair     IR THORACENTESIS ASP PLEURAL SPACE W/IMG GUIDE  01/21/2022   SACROPLASTY N/A 09/30/2018   Procedure: SACROPLASTY;  Surgeon: Hessie Knows, MD;  Location: [facility];  Service: Orthopedics;  Laterality: N/A;   UPPER EXTREMITY ANGIOGRAPHY Left 09/05/2017   Procedure: Upper Extremity Angiography;  Surgeon: Katha Cabal, MD;  Location: [facility];  Service: Cardiovascular;  Laterality: Left;     FAMILY HISTORY   Family History  Problem Relation Age of Onset   Hypertension Mother    Diabetes Neg Hx      SOCIAL HISTORY   Social History   Tobacco Use   Smoking status: Never   Smokeless tobacco: Never  Vaping Use   Vaping Use: Never  used  Substance Use Topics   Alcohol use: No    Comment: Drank in the past, but has stopped for several years.    Drug use: No     MEDICATIONS    Home Medication:    Current Medication:  Current Facility-Administered Medications:    0.9 %  sodium chloride infusion (Manually program via Guardrails IV Fluids), , Intravenous, Once, Hall, Carole N, DO   0.9 %  sodium chloride infusion, 250 mL, Intravenous, PRN, Agbata, Tochukwu, MD   acetaminophen (TYLENOL) tablet 650 mg, 650 mg, Oral, Q6H PRN, Agbata, Tochukwu, MD, 650 mg at  01/26/22 0620   allopurinol (ZYLOPRIM) tablet 50 mg, 50 mg, Oral, Daily, Agbata, Tochukwu, MD, 50 mg at 01/27/22 0943   alteplase (CATHFLO ACTIVASE) injection 2 mg, 2 mg, Intracatheter, Once PRN, Colon Flattery, NP   atorvastatin (LIPITOR) tablet 20 mg, 20 mg, Oral, Daily, Agbata, Tochukwu, MD, 20 mg at 01/27/22 0943   Chlorhexidine Gluconate Cloth 2 % PADS 6 each, 6 each, Topical, Q0600, Colon Flattery, NP, 6 each at 01/27/22 0544   cholecalciferol (VITAMIN D) tablet 1,000 Units, 1,000 Units, Oral, Daily, Agbata, Tochukwu, MD, 1,000 Units at 01/27/22 0943   epoetin alfa (EPOGEN) injection 10,000 Units, 10,000 Units, Intravenous, Q T,Th,Sa-HD, Breeze, Shantelle, NP, 10,000 Units at 01/26/22 1015   famotidine (PEPCID) tablet 20 mg, 20 mg, Oral, Daily, Agbata, Tochukwu, MD, 20 mg at 01/27/22 0943   ferrous sulfate tablet 325 mg, 325 mg, Oral, BID WC, Agbata, Tochukwu, MD, 325 mg at 01/27/22 0943   guaiFENesin (ROBITUSSIN) 100 MG/5ML liquid 10 mL, 10 mL, Oral, Q4H PRN, Agbata, Tochukwu, MD, 10 mL at 01/27/22 1007   haloperidol lactate (HALDOL) injection 1 mg, 1 mg, Intravenous, Q6H PRN, Starla Link, Kshitiz, MD, 1 mg at 01/23/22 0916   heparin injection 1,000 Units, 1,000 Units, Dialysis, PRN, Colon Flattery, NP   heparin injection 5,000 Units, 5,000 Units, Subcutaneous, Q8H, Hall, Carole N, DO, 5,000 Units at 01/27/22 0543   ipratropium-albuterol (DUONEB) 0.5-2.5 (3) MG/3ML nebulizer solution 3 mL, 3 mL, Nebulization, Q4H PRN, Starla Link, Kshitiz, MD   lactulose (CHRONULAC) 10 GM/15ML solution 20 g, 20 g, Oral, TID, Alekh, Kshitiz, MD, 20 g at 01/27/22 0946   lidocaine (PF) (XYLOCAINE) 1 % injection 5 mL, 5 mL, Intradermal, PRN, Breeze, Shantelle, NP   lidocaine-prilocaine (EMLA) cream 1 application, 1 application, Topical, PRN, Breeze, Shantelle, NP   LORazepam (ATIVAN) injection 0.5 mg, 0.5 mg, Intravenous, Q6H PRN, Starla Link, Kshitiz, MD   melatonin tablet 2.5 mg, 2.5 mg, Oral, QHS PRN, [place] Crane, Carole N,  DO, 2.5 mg at 01/26/22 2125   meropenem (MERREM) 500 mg in sodium chloride 0.9 % 100 mL IVPB, 500 mg, Intravenous, Daily, Danford, Suann Larry, MD, Last Rate: 200 mL/hr at 01/26/22 1612, 500 mg at 01/26/22 1612   midodrine (PROAMATINE) tablet 5 mg, 5 mg, Oral, TID WC, Kolluru, Sarath, MD, 5 mg at 01/27/22 0943   oxyCODONE (Oxy IR/ROXICODONE) immediate release tablet 5 mg, 5 mg, Oral, Q6H PRN, [place] Crane, Carole N, DO, 5 mg at 01/27/22 0955   pentafluoroprop-tetrafluoroeth (GEBAUERS) aerosol 1 application, 1 application, Topical, PRN, Breeze, Shantelle, NP   prochlorperazine (COMPAZINE) injection 5 mg, 5 mg, Intravenous, Q6H PRN, Hall, Carole N, DO   senna-docusate (Senokot-S) tablet 2 tablet, 2 tablet, Oral, QHS, Hall, Carole N, DO, 2 tablet at 01/26/22 2124   sodium chloride flush (NS) 0.9 % injection 3 mL, 3 mL, Intravenous, Q12H, Agbata, Tochukwu, MD, 3 mL at 01/27/22 1019   sodium chloride  flush (NS) 0.9 % injection 3 mL, 3 mL, Intravenous, PRN, Agbata, Tochukwu, MD   tamsulosin (FLOMAX) capsule 0.4 mg, 0.4 mg, Oral, QPC supper, Agbata, Tochukwu, MD, 0.4 mg at 01/26/22 1604    ALLERGIES   Patient has no known allergies.     REVIEW OF SYSTEMS    Review of Systems:  Gen:  Denies  fever, sweats, chills weigh loss  HEENT: Denies blurred vision, double vision, ear pain, eye pain, hearing loss, nose bleeds, sore throat Cardiac:  No dizziness, chest pain or heaviness, chest tightness,edema Resp:   Denies cough or sputum porduction, shortness of breath,wheezing, hemoptysis,  Gi: Denies swallowing difficulty, stomach pain, nausea or vomiting, diarrhea, constipation, bowel incontinence Gu:  Denies bladder incontinence, burning urine Ext:   Denies Joint pain, stiffness or swelling Skin: Denies  skin rash, easy bruising or bleeding or hives Endoc:  Denies polyuria, polydipsia , polyphagia or weight change Psych:   Denies depression, insomnia or hallucinations   Other:  All other systems  negative   VS: BP (!) 129/49 (BP Location: Right Arm)    Pulse 65    Temp 98.5 F (36.9 C) (Oral)    Resp 18    Ht 5\' 5"  (1.651 m)    Wt 76.8 kg    SpO2 97%    BMI 28.18 kg/m      PHYSICAL EXAM    GENERAL:NAD, no fevers, chills, no weakness no fatigue HEAD: Normocephalic, atraumatic.  EYES: Pupils equal, round, reactive to light. Extraocular muscles intact. No scleral icterus.  MOUTH: Moist mucosal membrane. Dentition intact. No abscess noted.  EAR, NOSE, THROAT: Clear without exudates. No external lesions.  NECK: Supple. No thyromegaly. No nodules. No JVD.  PULMONARY: Diffuse coarse rhonchi right sided +wheezes CARDIOVASCULAR: S1 and S2. Regular rate and rhythm. No murmurs, rubs, or gallops. No edema. Pedal pulses 2+ bilaterally.  GASTROINTESTINAL: Soft, nontender, nondistended. No masses. Positive bowel sounds. No hepatosplenomegaly.  MUSCULOSKELETAL: No swelling, clubbing, or edema. Range of motion full in all extremities.  NEUROLOGIC: Cranial nerves II through XII are intact. No gross focal neurological deficits. Sensation intact. Reflexes intact.  SKIN: No ulceration, lesions, rashes, or cyanosis. Skin warm and dry. Turgor intact.  PSYCHIATRIC: Mood, affect within normal limits. The patient is awake, alert and oriented x 3. Insight, judgment intact.       IMAGING    CT ABDOMEN PELVIS WO CONTRAST  Result Date: 01/21/2022 CLINICAL DATA:  Left lower quadrant abdominal pain with difficulty urinating for the past 3 weeks. Diarrhea. History of end-stage renal disease on hemodialysis. EXAM: CT ABDOMEN AND PELVIS WITHOUT CONTRAST TECHNIQUE: Multidetector CT imaging of the abdomen and pelvis was performed following the standard protocol without IV contrast. RADIATION DOSE REDUCTION: This exam was performed according to the departmental dose-optimization program which includes automated exposure control, adjustment of the mA and/or kV according to patient size and/or use of iterative  reconstruction technique. COMPARISON:  Abdominopelvic CT 06/15/2021. Chest radiographs earlier today. FINDINGS: Lower chest: As seen on earlier radiographs, there is new moderate-sized laterally loculated right pleural effusion. There are some air bubbles posteriorly in the right pleural space (image 16/2). There is compressive atelectasis or consolidation of the right lower lobe. Streaky left lower lobe pulmonary opacities are similar to the previous study. No evidence of pneumothorax. Atherosclerosis of the aorta and coronary arteries noted. Hepatobiliary: Nodular contours of the liver again noted suspicious for early cirrhosis. No focal lesion identified without contrast. No evidence of gallstones, gallbladder wall thickening or  biliary dilatation. Pancreas: Unremarkable. No pancreatic ductal dilatation or surrounding inflammatory changes. Spleen: Normal in size without focal abnormality. Adrenals/Urinary Tract: Both adrenal glands appear normal. Bilateral renal cortical thinning with nonspecific perinephric soft tissue stranding again noted bilaterally. Small low-density renal lesions bilaterally are stable, likely cysts. No evidence of urinary tract calculus or hydronephrosis. The bladder appears grossly unremarkable. Stomach/Bowel: No enteric contrast administered. The stomach is poorly distended with possible generalized wall thickening, similar to the previous study. No evidence of bowel wall thickening, distention or surrounding inflammation. Vascular/Lymphatic: There are several mildly enlarged lymph nodes in the pelvis, including right iliac nodes measuring 8 mm on image 75/2 and 12 mm on image 81/2. There is a 10 mm left external iliac node on image 80/2. Aortic and branch vessel atherosclerosis without acute vascular findings on noncontrast imaging. Reproductive: The prostate gland is enlarged. There are multiple new areas of ill-defined low-density within the gland, measuring up to 3.1 x 2.6 cm on  image 92/2. No definite surrounding inflammatory changes to suggest that this is inflammatory. Mild perirectal soft tissue stranding is similar to the previous study. Other: Mild soft tissue stranding throughout the mesenteric fat without significant ascites. No free air. Musculoskeletal: No acute or significant osseous findings. Previous bilateral iliac bone osteoplasty. IMPRESSION: 1. New ill-defined low density throughout the prostate gland which could reflect prostate cancer or prostatitis/abscess formation. Correlate with serum PSA levels. Recommend urology consultation. 2. Several mildly enlarged pelvic lymph nodes suspicious for metastatic disease. 3. No evidence of hydronephrosis or focal bladder abnormality. Stable chronic renal cortical thinning consistent with chronic renal failure. 4. As seen on earlier chest radiographs, laterally loculated right pleural effusion with air in the pleural space which may indicate empyema. Correlate clinically. Compressive right lower lobe atelectasis or consolidation. 5.  Aortic Atherosclerosis (ICD10-I70.0). Electronically Signed   By: Richardean Sale M.D.   On: 01/21/2022 11:59   DG Chest 2 View  Result Date: 01/21/2022 CLINICAL DATA:  81 year old male with history of chest pain and cough. EXAM: CHEST - 2 VIEW COMPARISON:  Chest x-ray 12/09/2021. FINDINGS: Diffuse interstitial prominence and widespread peribronchial cuffing again noted. New moderate to large partially loculated right pleural effusion with increasing atelectasis and/or consolidation throughout the right mid to lower lung. Probable areas of subsegmental atelectasis at the left lung base. No definite left pleural effusion. No pneumothorax. No evidence of pulmonary edema. Heart size is mildly enlarged. Upper mediastinal contours are within normal limits. IMPRESSION: 1. New moderate to large right pleural effusion which is likely partially loculated with extensive atelectasis and/or consolidation in the  right mid to lower lung. 2. Probable subsegmental atelectasis in the left lung base. 3. Diffuse interstitial prominence and peribronchial cuffing, concerning for an acute bronchitis. 4. Mild cardiomegaly. 5. Aortic atherosclerosis. Electronically Signed   By: Vinnie Langton M.D.   On: 01/21/2022 10:48   DG Lumbar Spine 2-3 Views  Result Date: 01/21/2022 CLINICAL DATA:  Back pain EXAM: LUMBAR SPINE - 2-3 VIEW COMPARISON:  10/11/2018 FINDINGS: No recent fracture is seen. There is previous vertebroplasty in the iliac bones on both sides. Degenerative changes are noted with bony spurs and facet hypertrophy. There is no significant disc space narrowing. Arterial calcifications are seen in the aorta and its major branches. IMPRESSION: No recent fracture is seen. Lumbar spondylosis. No significant interval changes are noted. Electronically Signed   By: Elmer Picker M.D.   On: 01/21/2022 16:48   DG Chest Port 1 View  Result Date: 01/25/2022 CLINICAL  DATA:  Sepsis secondary to urinary tract infection. History of end-stage renal disease. EXAM: PORTABLE CHEST 1 VIEW COMPARISON:  Radiographs 01/23/2022 and 01/22/2022.  CT 02/18/2017. FINDINGS: 0739 hours. Peripheral chest tube inferiorly in the right hemithorax is unchanged in position. Partially loculated right pleural effusion is unchanged from the most recent study. There is no pneumothorax. The heart size and mediastinal contours are stable. There is mildly increased subsegmental atelectasis at the left lung base. IMPRESSION: No significant change in residual partially loculated right pleural effusion following chest tube placement. Increased left basilar atelectasis. Electronically Signed   By: Richardean Sale M.D.   On: 01/25/2022 08:26   DG Chest Port 1 View  Result Date: 01/23/2022 CLINICAL DATA:  Dyspnea R06.00 (ICD-10-CM) EXAM: PORTABLE CHEST 1 VIEW COMPARISON:  February 7, 23. FINDINGS: Interval placement of a right chest tube with tip along the  right lateral and inferior chest. Mildly decreased loculated right pleural effusion. Similar adjacent consolidation and/or atelectasis. Small left pleural effusion with mild overlying left basilar opacities. No visible pneumothorax. Cardiomediastinal silhouette is similar. No evidence of acute osseous abnormality. IMPRESSION: 1. Interval placement of a right chest tube withmildly decreased loculated right pleural effusion. Similar adjacent consolidation and/or atelectasis. 2. Small left pleural effusion with mild overlying left basilar opacities. Electronically Signed   By: Margaretha Sheffield M.D.   On: 01/23/2022 16:40   DG Chest Port 1 View  Result Date: 01/22/2022 CLINICAL DATA:  Hemothorax, diabetes mellitus, hypertension EXAM: PORTABLE CHEST 1 VIEW COMPARISON:  Portable exam is 1019 hours compared to 01/21/2022 FINDINGS: Enlargement of cardiac silhouette with vascular congestion. Atherosclerotic calcification aorta. Large loculated fluid collection at the lateral RIGHT chest, could represent hemothorax or loculated pleural effusion. Significant atelectasis of mid to lower RIGHT lung. Scattered infiltrates in both lungs. No pneumothorax. Rib fractures seen on prior CT exam are not well visualized radiographically. IMPRESSION: Persistent loculated collection at lateral mid to lower RIGHT hemithorax which could represent hemothorax or loculated effusion. Significant atelectasis of the adjacent mid to lower RIGHT lung with scattered infiltrates in remaining lungs bilaterally. No pneumothorax. Aortic Atherosclerosis (ICD10-I70.0). Electronically Signed   By: Lavonia Dana M.D.   On: 01/22/2022 10:27   DG Chest Port 1 View  Result Date: 01/21/2022 CLINICAL DATA:  Back pain. Kidney disease with current UTI. Post thoracentesis. EXAM: PORTABLE CHEST 1 VIEW COMPARISON:  AP chest 01/21/2022, CT chest 02/18/2017, AP chest 12/09/2021 FINDINGS: Cardiac silhouette appears mildly enlarged. Mediastinal contours are within  normal limits. Mild calcification within aortic arch. There is not a definite change in the increased density apparent represent a taller than wide loculated pleural effusion of the inferolateral right hemithorax. Heterogeneous airspace opacification within the right mid and lower lung. No definite left pleural effusion. No pneumothorax. No definite interstitial pulmonary edema. Moderate multilevel degenerative disc changes of the thoracic spine. IMPRESSION: Persistent moderate-to-large loculated right pleural effusion involving the inferolateral right hemithorax. Electronically Signed   By: Yvonne Kendall M.D.   On: 01/21/2022 16:49   CT IMAGE GUIDED FLUID DRAIN BY CATHETER  Result Date: 01/22/2022 INDICATION: Complex loculated right pleural effusion EXAM: CT-GUIDED 14 FRENCH RIGHT CHEST TUBE PLACEMENT MEDICATIONS: The patient is currently admitted to the hospital and receiving intravenous antibiotics. The antibiotics were administered within an appropriate time frame prior to the initiation of the procedure. ANESTHESIA/SEDATION: Moderate (conscious) sedation was employed during this procedure. A total of Versed 0 mg and Fentanyl 25 mcg was administered intravenously by the radiology nurse. Total intra-service moderate Sedation Time:  None. The patient's level of consciousness and vital signs were monitored continuously by radiology nursing throughout the procedure under my direct supervision. COMPLICATIONS: None immediate. PROCEDURE: Informed written consent was obtained from the patient through a Spanish interpreter after a thorough discussion of the procedural risks, benefits and alternatives. All questions were addressed. Maximal Sterile Barrier Technique was utilized including caps, mask, sterile gowns, sterile gloves, sterile drape, hand hygiene and skin antiseptic. A timeout was performed prior to the initiation of the procedure. Previous imaging reviewed. Patient positioned slightly right anterior oblique.  Noncontrast imaging performed through the chest. An anterior lower intercostal space was localized and marked for access. Under sterile conditions and local anesthesia, an 18 gauge 10 cm access was advanced into the loculated right pleural effusion. Needle position confirmed with CT. Guidewire inserted followed by tract dilatation to insert a 14 French drain. Drain catheter position confirmed with CT. Syringe aspiration yielded serosanguineous blood-tinged fluid. Sample sent for culture. Catheter secured with Prolene suture and connected to external pleura vac. Sterile dressing applied. No immediate complication. Patient tolerated the procedure well. IMPRESSION: Successful CT-guided 14 French right chest tube insertion. Electronically Signed   By: Jerilynn Mages.  Shick M.D.   On: 01/22/2022 16:15   ECHOCARDIOGRAM LIMITED  Result Date: 01/23/2022    ECHOCARDIOGRAM LIMITED REPORT   Patient Name:   Martin Gilbert Date of Exam: 01/22/2022 Medical Rec #:  [id number]              Height:       65.0 in Accession #:    [id number]             Weight:       170.0 lb Date of Birth:  21-Dec-1940               BSA:          1.846 m Patient Age:    81 years               BP:           115/43 mmHg Patient Gender: M                      HR:           76 bpm. Exam Location:  [facility] Procedure: 2D Echo, Limited Echo, Limited Color Doppler and Cardiac Doppler Indications:     N86.76 Acute Diastolic CHF                   COVID +  History:         Patient has prior history of Echocardiogram examinations, most                  recent 03/27/2017. Signs/Symptoms:Chronic kidney disease; Risk                  Factors:Hypertension, Diabetes and Dyslipidemia.  Sonographer:     Cresenciano Lick RDCS Referring Phys:  [id number] [place] Diagnosing Phys: Nelva Bush MD IMPRESSIONS  1. There is mild left ventricular hypertrophy. Left ventricular diastolic parameters are consistent with Grade I diastolic dysfunction (impaired relaxation).  Elevated left atrial pressure.  2. Right ventricular systolic function is normal. The right ventricular size is normal.  3. The mitral valve is normal in structure. Trivial mitral valve regurgitation. No evidence of mitral stenosis.  4. The aortic valve is tricuspid. There is mild calcification of the aortic valve. There is mild thickening of the aortic valve.  Aortic valve regurgitation is not visualized. Aortic valve sclerosis is present, with no evidence of aortic valve stenosis.  5. Pulmonic valve regurgitation not well assessed.  6. The inferior vena cava is normal in size with <50% respiratory variability, suggesting right atrial pressure of 8 mmHg. FINDINGS  Left Ventricle: The left ventricular internal cavity size was normal in size. There is mild left ventricular hypertrophy. Left ventricular diastolic parameters are consistent with Grade I diastolic dysfunction (impaired relaxation). Elevated left atrial  pressure. Right Ventricle: The right ventricular size is normal. No increase in right ventricular wall thickness. Right ventricular systolic function is normal. Pericardium: There is no evidence of pericardial effusion. Mitral Valve: The mitral valve is normal in structure. Mild mitral annular calcification. Trivial mitral valve regurgitation. No evidence of mitral valve stenosis. Tricuspid Valve: The tricuspid valve is normal in structure. Tricuspid valve regurgitation is trivial. Aortic Valve: The aortic valve is tricuspid. There is mild calcification of the aortic valve. There is mild thickening of the aortic valve. Aortic valve regurgitation is not visualized. Aortic valve sclerosis is present, with no evidence of aortic valve stenosis. Pulmonic Valve: The pulmonic valve was not well visualized. Pulmonic valve regurgitation not well assessed. Aorta: The aortic root is normal in size and structure. Pulmonary Artery: The pulmonary artery is not well seen. Venous: The inferior vena cava is normal in size  with less than 50% respiratory variability, suggesting right atrial pressure of 8 mmHg. IAS/Shunts: The interatrial septum was not well visualized. LEFT VENTRICLE PLAX 2D LVIDd:         4.70 cm Diastology LVIDs:         3.20 cm LV e' medial:    6.85 cm/s LV PW:         1.20 cm LV E/e' medial:  16.9 LV IVS:        1.20 cm LV e' lateral:   10.30 cm/s                        LV E/e' lateral: 11.3  RIGHT VENTRICLE             IVC RV S prime:     13.30 cm/s  IVC diam: 1.50 cm TAPSE (M-mode): 2.6 cm LEFT ATRIUM         Index LA diam:    5.60 cm 3.03 cm/m   AORTA Ao Root diam: 3.50 cm MITRAL VALVE MV Area (PHT): 2.87 cm MV Decel Time: 264 msec MV E velocity: 116.00 cm/s MV A velocity: 120.00 cm/s MV E/A ratio:  0.97 Harrell Gave End MD Electronically signed by Nelva Bush MD Signature Date/Time: 01/23/2022/7:14:02 AM    Final    IR THORACENTESIS ASP PLEURAL SPACE W/IMG GUIDE  Result Date: 01/21/2022 INDICATION: Patient with history of ESRD who presented to the ED with abdominal pain and difficulty urinating found to have large right loculated pleural effusion. Patient noted to have right sided rib fractures on CT abdomen/pelvis today as well. Request to IR for diagnostic right thoracentesis. EXAM: ULTRASOUND GUIDED RIGHT THORACENTESIS MEDICATIONS: 6 mL 1% lidocaine COMPLICATIONS: None immediate. PROCEDURE: An ultrasound guided thoracentesis was thoroughly discussed with the patient and questions answered. The benefits, risks, alternatives and complications were also discussed. The patient understands and wishes to proceed with the procedure. Written consent was obtained. Ultrasound was performed to localize and mark an adequate pocket of fluid in the right chest. The area was then prepped and draped in the normal sterile fashion. 1% Lidocaine  was used for local anesthesia. Under ultrasound guidance a 6 Fr Safe-T-Centesis catheter was introduced. Thoracentesis was performed. The catheter was removed and a dressing  applied. FINDINGS: A total of approximately 10 mL of serosanguineous fluid was removed. Samples were sent to the laboratory as requested by the clinical team. IMPRESSION: Successful ultrasound guided right thoracentesis yielding 10 mL of pleural fluid. Appearance of the right pleural fluid on ultrasound is compatible with a posttraumatic hemothorax, which would be compatible with the patient's posterior rib fractures identified on recent CT. This would not be amenable to further image guided drainage. Consider surgical consultation for further management. Read by Candiss Norse, PA-C Electronically Signed   By: Albin Felling M.D.   On: 01/21/2022 16:19      ASSESSMENT/PLAN   Persistent bloody loculated right pleural effusion. Clear cxr 12/25, has rib fractures. Hence suspect tramatic effusion. Not clear if it is infected ( high neutrophil count count, exudate, low normal glucose, ID following) vs contaminant, suspect the later.  Family were offered lytic lysis with the understanding he is not a surgical candidate. The family have decided against lytic lysis.     -pulmonary wise will comply with their wishes -continue supportive care -will pull tube out tomorrow.    Thank you for allowing me to participate in the care of this patient.   Patient/Family are satisfied with care plan and all questions have been answered.  This document was prepared using Dragon voice recognition software and may include unintentional dictation errors.     Wallene Huh, M.D.  Division of [place]

## 2022-01-27 NOTE — Progress Notes (Signed)
Progress Note   Patient: Martin Gilbert ZOX:[id number] DOB: February 25, 1941 DOA: 01/21/2022     6 DOS: the patient was seen and examined on 01/27/2022       Brief hospital course: [name] is an 81 y.o. M with ESRD on HD, HTN, DM, dCHF who presented with abdominal pain for 3 weeks.  In the ER, urinalysis (still makes significant urine) showed UTI.  Imaging showed low-density area near prostate concerning ofr prostatitis vs abscess.  Urology consulted and admitted on antibiotics.  Also noted to have loculated RIGHT pleural effusion.   2/6: Admitted on antibiotics, had diagnostic thoracentesis 2/7: CT surgery recommended pigtail catheter, placed 2nd day 2/8: Developed delirium 2/10: Chest fluid culture growing ESBL E coli 2/11: Delirium completely resolved 2/12: Family have elected not to do tPA in pleural catheter       Assessment and Plan: * Acute prostatitis Patient presented with suprapubic pain, difficulty urinating.  CAT scan showed a small lucency, possible prostatitis versus prostatic abscess.  Urology were consulted, recommended medical treatment and outpatient follow-up.  Sepsis was ruled out. - Continue meropenem, day 7 of antibiotics - Follow repeat urine culture and speciation of original culture, still pending  - Continue Flomax - Foley placed on admission --> follow-up with urology after discharge      Sepsis ([name])-resolved as of 01/24/2022, (present on admission) Due to complicated UTI with acute urinary retention, possibly prostatitis.  Urinary retention - See above  UTI (urinary tract infection)- (present on admission) - See above  Acute respiratory failure with hypoxia ([name]) At baseline the patient does not use oxygen.  Here he required up to 5 and 6 L of oxygen at times due to effusion, now weaned to room air.     Pleural effusion on right- (present on admission) See analysis by Pulmonology  Output has dwindled to nothing in the last 72  hours.   I spoke with CT surgery, not a surgical candidate.  They recommend lytics.  Discussed with family at length, discussed risks and benefits, including uncertainty of improvement with lytics or without, as well as risk of bleeding with lytics, family have elected to pursue conservative managmenet, watchful waiting. - Consult pulmonology - Remove chest tube tomorrow - May need oxygen at discharge  Acute metabolic encephalopathy- (present on admission) Likely due to infection, hypoxia.  Hard of hearing and language barrier complicates evaluation.    Had completely resolved, but still reported from family to have waxing and waning confusion, some slowly improving delirium it appears  Chronic diastolic CHF (congestive heart failure) ([place])- (present on admission) Acute CHF ruled out. - Minimize fluids   Gout -Continue allopurinol  Pressure injury of right buttock, stage 2 ([place])- (present on admission) - Mepilex dressing  Elevated troponin Ischemia ruled out.  Due to poor clearance, non-infarction troponin elevation.  Hepatic cirrhosis ([place])- (present on admission) Per imaging.  No clinical signs of cirrhosis, and I do not suspect his confusion is hepatic encephalopathy. -Continue midodrine  Diabetes mellitus, type II ([name]) Glucose controlled adequately -Continue statin  ESRD (end stage renal disease) ([place])- (present on admission) - Consult Nephrology, appreciate cares  Anemia of chronic disease- (present on admission) -Continue iron, EPO              Subjective: History collected from daughter at the bedside who speaks [place].  She interpreted for the father. Still has pain in the bottoms of his feet, leg weakness, periumbilical pain.  No fever, vomiting, chest pain, dyspnea.  Physical Exam: Vitals:   01/26/22 2311 01/27/22 0331 01/27/22 0544 01/27/22 0746  BP: (!) 135/51 (!) 134/53  (!) 129/49  Pulse: 70 65  65  Resp: 18 18  18   Temp: 98.4 F (36.9  C) 98.6 F (37 C)  98.5 F (36.9 C)  TempSrc: Oral Oral  Oral  SpO2: 95% 98%  97%  Weight:   76.8 kg   Height:       Frail elderly adult male, lying in bed, resting Chest tube still in place, lung sounds diminished on the right, clear on the left, good air movement, no wheezing, respiratory effort normal Heart rate regular, soft systolic murmur, no lower extremity edema Mostly inattentive today, moves upper extremities with generalized weakness but symmetric strength, limited by weakness Attention diminished, affect blunted.         Data Reviewed: Discussed with  pulmonology His labs and imaging are unremarkable for.  Pending susceptibilities on his E. coli, glucose normal, sputum smears pending   Family Communication: Daughter at the bedside  Disposition: Status is: Inpatient  Remains inpatient appropriate because: Patient will require chest tube removal tomorrow, then evaluation by infectious disease for duration of antibiotics.  At that point he will be medically ready we will need to rule out TB with a third sputum smear before discharge to snf     Planned Discharge Destination: SNF       Author: Edwin Dada, MD 01/27/2022 3:02 PM  For on call review [email or web address].

## 2022-01-27 NOTE — Progress Notes (Signed)
Reinstructed pt on the use of incentive spirometry and flutter. Pt has good cough but seems to swallow mucus just before he coughs up. RN notified

## 2022-01-27 NOTE — Progress Notes (Signed)
[facility]  ROUNDING NOTE   Subjective:   [name] was admitted to [facility] on 01/21/2022 for Urinary retention [R33.9] Back pain [M54.9] UTI (urinary tract infection) [N39.0] Pleural effusion [J90] Pleural effusion on right [J90] Acute cystitis with hematuria [N30.01] Chest pain, unspecified type [R07.9] Sepsis, due to unspecified organism, unspecified whether acute organ dysfunction present [facility]) [A41.9] Acute cough [R05.1]  Last hemodialysis treatment was Saturday, February 4. Left at 72.5kg - below his dry weight.  History is difficult to get even with [place] Interpreter.   Family at bedside Continues to complains of midline, abdominal pain Poor appetite Denies shortness of breath, remains on 2L [place]  Chest tube remains in place Foley in place, minimal output  Objective:  Vital signs in last 24 hours:  Temp:  [98.1 F (36.7 C)-99.5 F (37.5 C)] 98.5 F (36.9 C) (02/12 0746) Pulse Rate:  [58-85] 65 (02/12 0746) Resp:  [14-21] 18 (02/12 0746) BP: (129-163)/(49-61) 129/49 (02/12 0746) SpO2:  [94 %-100 %] 97 % (02/12 0746) Weight:  [76 kg-76.8 kg] 76.8 kg (02/12 0544)  Weight change: 0 kg Filed Weights   01/26/22 0920 01/26/22 1252 01/27/22 0544  Weight: 79.1 kg 76 kg 76.8 kg    Intake/Output: I/O last 3 completed shifts: In: 100 [IV Piggyback:100] Out: 1066 [Urine:20; Other:1000; Stool:1; Chest Tube:45]   Intake/Output this shift:  No intake/output data recorded.  Physical Exam: General: NAD,resting in bed  Head: Normocephalic, atraumatic. Moist oral mucosal membranes  Eyes: Anicteric  Lungs:  Basilar rales, normal effort, cough, Right sided Chest tube  Heart: Regular rate and rhythm  Abdomen:  Suprapubic tenderness  Extremities:  1+ peripheral edema.  Neurologic: Nonfocal, moving all four extremities  Skin: No lesions   Access: Left AVF  GU Foley  Basic Metabolic Panel: Recent Labs  Lab 01/21/22 1025 01/22/22 0424  01/23/22 0434 01/24/22 0627 01/26/22 0525  NA 130* 130* 133* 135 132*  K 3.9 4.3 3.7 4.1 3.7  CL 96* 97* 98 99 95*  CO2 24 23 26 23 24   GLUCOSE 121* 103* 123* 85 98  BUN 69* 74* 39* 46* 36*  CREATININE 6.52* 7.52* 5.17* 6.63* 6.09*  CALCIUM 7.9* 7.8* 7.7* 7.1* 7.0*  MG  --   --   --  1.7  --   PHOS  --   --  2.7  --   --      Liver Function Tests: Recent Labs  Lab 01/21/22 1025 01/23/22 0434 01/24/22 0627  AST 52*  --  25  ALT 20  --  13  ALKPHOS 443*  --  331*  BILITOT 1.6*  --  1.4*  PROT 6.3*  --  5.5*  ALBUMIN 1.9* 1.8* 1.6*    Recent Labs  Lab 01/21/22 1025  LIPASE 24    Recent Labs  Lab 01/23/22 0956 01/24/22 0627  AMMONIA 37* 41*     CBC: Recent Labs  Lab 01/21/22 1025 01/22/22 0424 01/22/22 1029 01/23/22 0434 01/24/22 0627 01/25/22 0439 01/26/22 0525  WBC 12.1* 8.1  --  11.0* 15.1* 10.3 9.0  NEUTROABS 10.2*  --   --   --  12.3*  --   --   HGB 7.7* 7.2* 7.2* 8.5* 8.0* 8.1* 8.3*  HCT 23.8* 21.7* 21.8* 26.0* 24.0* 24.2* 25.4*  MCV 97.9 94.8  --  94.2 92.7 92.0 93.4  PLT 226 216  --  198 176 189 165     Cardiac Enzymes: No results for input(s): CKTOTAL, CKMB, CKMBINDEX,  TROPONINI in the last 168 hours.  BNP: Invalid input(s): POCBNP  CBG: Recent Labs  Lab 01/26/22 0825 01/26/22 1947 01/26/22 2315 01/27/22 0343 01/27/22 0748  GLUCAP 92 116* 113* 105* 44     Microbiology: Results for orders placed or performed during the hospital encounter of 01/21/22  Urine Culture     Status: Abnormal (Preliminary result)   Collection Time: 01/21/22 10:25 AM   Specimen: Urine, Random  Result Value Ref Range Status   Specimen Description   Final    URINE, RANDOM Performed at [facility], [address], [place], [place] [address]    Special Requests   Final    NONE Performed at [facility], [address] [place], [place] [address]    Culture (A)  Final    >=100,000 COLONIES/mL ESCHERICHIA COLI CORRECTED ON  02/10 AT 1430: PREVIOUSLY REPORTED AS MULTIPLE SPECIES PRESENT, SUGGEST RECOLLECTION SUSCEPTIBILITIES TO FOLLOW Performed at [facility], [place] [address], [place], [place] [address]    Report Status PENDING  Incomplete  Blood Culture (routine x 2)     Status: None   Collection Time: 01/21/22  1:12 PM   Specimen: BLOOD  Result Value Ref Range Status   Specimen Description BLOOD BLOOD RIGHT ARM  Final   Special Requests   Final    BOTTLES DRAWN AEROBIC AND ANAEROBIC Blood Culture adequate volume   Culture   Final    NO GROWTH 5 DAYS Performed at [facility], [address], [place], [place] [address]    Report Status 01/26/2022 FINAL  Final  Blood Culture (routine x 2)     Status: None   Collection Time: 01/21/22  1:12 PM   Specimen: BLOOD  Result Value Ref Range Status   Specimen Description BLOOD BLOOD RIGHT FOREARM  Final   Special Requests   Final    BOTTLES DRAWN AEROBIC AND ANAEROBIC Blood Culture adequate volume   Culture   Final    NO GROWTH 5 DAYS Performed at [facility], [address] [place], [place] [address]    Report Status 01/26/2022 FINAL  Final  Acid Fast Smear (AFB)     Status: None   Collection Time: 01/21/22  4:02 PM   Specimen: Chest; Body Fluid  Result Value Ref Range Status   AFB Specimen Processing Concentration  Final   Acid Fast Smear Negative  Final    Comment: (NOTE) Performed At: [facility] [place], [place] [id number] Rush Farmer MD GQ:[id number]    Source (AFB) NONE  Final    Comment: Performed at [facility], [place] [address] [place], [place] [address]  Body fluid culture w Gram Stain     Status: None   Collection Time: 01/21/22  4:02 PM   Specimen: Chest; Body Fluid  Result Value Ref Range Status   Specimen Description   Final    CHEST FLUID Performed at [facility], [place] [address], [place], [place] [address]    Special Requests   Final    NONE Performed at  [facility], [address] [place], [place] [address]    Gram Stain   Final    NO WBC SEEN NO ORGANISMS SEEN Performed at [facility], [place] [address], [place],  [address]    Culture   Final    FEW ESCHERICHIA COLI Confirmed Extended Spectrum Beta-Lactamase Producer (ESBL).  In bloodstream infections from ESBL organisms, carbapenems are preferred  over piperacillin/tazobactam. They are shown to have a lower risk of mortality.    Report Status 01/25/2022 FINAL  Final   Organism ID, Bacteria ESCHERICHIA COLI  Final      Susceptibility   Escherichia coli - MIC*    AMPICILLIN >=32 RESISTANT Resistant     CEFAZOLIN >=64 RESISTANT Resistant     CEFEPIME 16 RESISTANT Resistant     CEFTAZIDIME RESISTANT Resistant     CEFTRIAXONE >=64 RESISTANT Resistant     CIPROFLOXACIN >=4 RESISTANT Resistant     GENTAMICIN >=16 RESISTANT Resistant     IMIPENEM <=0.25 SENSITIVE Sensitive     TRIMETH/SULFA >=320 RESISTANT Resistant     AMPICILLIN/SULBACTAM >=32 RESISTANT Resistant     PIP/TAZO 8 SENSITIVE Sensitive     * FEW ESCHERICHIA COLI  Aerobic/Anaerobic Culture w Gram Stain (surgical/deep wound)     Status: None (Preliminary result)   Collection Time: 01/22/22  3:56 PM   Specimen: Pleural Fluid  Result Value Ref Range Status   Specimen Description   Final    PLEURAL RIGHT Performed at [facility], [address] [place], [place] [address]    Special Requests   Final    PLE FLU Performed at [facility], [address] [place], [place] [address]    Gram Stain   Final    RARE WBC PRESENT, PREDOMINANTLY MONONUCLEAR NO ORGANISMS SEEN    Culture   Final    NO GROWTH 4 DAYS NO ANAEROBES ISOLATED; CULTURE IN PROGRESS FOR 5 DAYS Performed at [place] [address], [place], [place] [address]    Report Status PENDING  Incomplete  Resp Panel by RT-PCR (Flu A&B, Covid) Nasopharyngeal Swab     Status: None   Collection Time: 01/23/22   4:52 PM   Specimen: Nasopharyngeal Swab; Nasopharyngeal(NP) swabs in vial transport medium  Result Value Ref Range Status   SARS Coronavirus 2 by RT PCR NEGATIVE NEGATIVE Final    Comment: (NOTE) SARS-CoV-2 target nucleic acids are NOT DETECTED.  The SARS-CoV-2 RNA is generally detectable in upper respiratory specimens during the acute phase of infection. The lowest concentration of SARS-CoV-2 viral copies this assay can detect is 138 copies/mL. A negative result does not preclude SARS-Cov-2 infection and should not be used as the sole basis for treatment or other patient management decisions. A negative result may occur with  improper specimen collection/handling, submission of specimen other than nasopharyngeal swab, presence of viral mutation(s) within the areas targeted by this assay, and inadequate number of viral copies(<138 copies/mL). A negative result must be combined with clinical observations, patient history, and epidemiological information. The expected result is Negative.  Fact Sheet for Patients:  EntrepreneurPulse.com.au  Fact Sheet for Healthcare Providers:  IncredibleEmployment.be  This test is no t yet approved or cleared by the [place] FDA and  has been authorized for detection and/or diagnosis of SARS-CoV-2 by FDA under an Emergency Use Authorization (EUA). This EUA will remain  in effect (meaning this test can be used) for the duration of the COVID-19 declaration under Section 564(b)(1) of the Act, 21 U.S.C.section 360bbb-3(b)(1), unless the authorization is terminated  or revoked sooner.       Influenza A by PCR NEGATIVE NEGATIVE Final   Influenza B by PCR NEGATIVE NEGATIVE Final    Comment: (NOTE) The Xpert Xpress SARS-CoV-2/FLU/RSV plus assay is intended as an aid in the diagnosis of influenza from Nasopharyngeal swab specimens and should not be used as a sole basis  for treatment. Nasal washings and aspirates  are unacceptable for Xpert Xpress SARS-CoV-2/FLU/RSV testing.  Fact Sheet for Patients: EntrepreneurPulse.com.au  Fact Sheet for Healthcare Providers: IncredibleEmployment.be  This test is not yet approved or cleared by the [place] FDA and has been authorized for detection and/or diagnosis of SARS-CoV-2 by FDA under an Emergency Use Authorization (EUA). This EUA will remain in effect (meaning this test can be used) for the duration of the COVID-19 declaration under Section 564(b)(1) of the Act, 21 U.S.C. section 360bbb-3(b)(1), unless the authorization is terminated or revoked.  Performed at [facility], [address] [place], [place] [address]   Aerobic/Anaerobic Culture w Gram Stain (surgical/deep wound)     Status: None (Preliminary result)   Collection Time: 01/25/22 12:00 PM   Specimen: Pleura  Result Value Ref Range Status   Specimen Description   Final    PLEURAL Performed at [facility], [address] [place], [place] [address]    Special Requests   Final    RIGHT CHEST TUBE Performed at [facility], [place], [place] [address]    Gram Stain NO WBC SEEN NO ORGANISMS SEEN   Final   Culture   Final    NO GROWTH < 24 HOURS Performed at [facility], [place] [address], [place], [place] [address]    Report Status PENDING  Incomplete    Coagulation Studies: No results for input(s): LABPROT, INR in the last 72 hours.   Urinalysis: No results for input(s): COLORURINE, LABSPEC, PHURINE, GLUCOSEU, HGBUR, BILIRUBINUR, KETONESUR, PROTEINUR, UROBILINOGEN, NITRITE, LEUKOCYTESUR in the last 72 hours.  Invalid input(s): APPERANCEUR     Imaging: No results found.   Medications:    sodium chloride     meropenem (MERREM) IV 500 mg (01/26/22 1612)    sodium chloride   Intravenous Once   allopurinol  50 mg Oral Daily   atorvastatin  20 mg Oral Daily   Chlorhexidine  Gluconate Cloth  6 each Topical Q0600   cholecalciferol  1,000 Units Oral Daily   epoetin (EPOGEN/PROCRIT) injection  10,000 Units Intravenous Q T,Th,Sa-HD   famotidine  20 mg Oral Daily   ferrous sulfate  325 mg Oral BID WC   heparin injection (subcutaneous)  5,000 Units Subcutaneous Q8H   lactulose  20 g Oral TID   midodrine  5 mg Oral TID WC   senna-docusate  2 tablet Oral QHS   sodium chloride flush  3 mL Intravenous Q12H   tamsulosin  0.4 mg Oral QPC supper     Assessment/ Plan:  [name] is a 81 y.o. Hispanic male who speaks [place] and limited [place] and [place].  Patient with end stage renal disease on hemodialysis, hypertension, BPH, diabetes mellitus type II, hyperlipidemia who presents to [facility] on 01/21/2022 for Urinary retention [R33.9] Back pain [M54.9] UTI (urinary tract infection) [N39.0] Pleural effusion [J90] Pleural effusion on right [J90] Acute cystitis with hematuria [N30.01] Chest pain, unspecified type [R07.9] Sepsis, due to unspecified organism, unspecified whether acute organ dysfunction present ([place]) [A41.9] Acute cough [R05.1]   [facility] TTS [facility] Left AVF 73kg.    End stage renal disease: Continue TTS schedule. Received dialysis yesterday, tolerated well. UF goal 1L achieved.  Next treatment scheduled for Tuesday.    Anemia of chronic kidney disease: hemoglobin 8.3, below desired target Will order updated labs in am - EPO with dialysis treatments   Hypertension: .Home regimen includes hydralazine, metoprolol and tamsulosin.  BP 129/49,  stable for this patient   Secondary Hyperparathyroidism: Continue cholecalciferol daily.   -Calcium remains decreased at 7.0. Will continue to monitor  5.  Right pleural effusion likely due to rib fracture.  Posttraumatic hemorrhagic pleural effusion.  Right chest tube placed on 01/22/2022.  Testing sent on drainage.  Found to have escherichia coli. Pulmonology and ID  following.  -Receiving  Meropenem.  -Not CT surgical candidate, recommended lytics    LOS: 6   2/12/202310:32 AM

## 2022-01-28 ENCOUNTER — Inpatient Hospital Stay: Payer: Self-pay

## 2022-01-28 DIAGNOSIS — J90 Pleural effusion, not elsewhere classified: Secondary | ICD-10-CM | POA: Diagnosis not present

## 2022-01-28 DIAGNOSIS — N412 Abscess of prostate: Secondary | ICD-10-CM | POA: Diagnosis not present

## 2022-01-28 DIAGNOSIS — N410 Acute prostatitis: Secondary | ICD-10-CM | POA: Diagnosis not present

## 2022-01-28 DIAGNOSIS — N3001 Acute cystitis with hematuria: Secondary | ICD-10-CM | POA: Diagnosis not present

## 2022-01-28 DIAGNOSIS — G9341 Metabolic encephalopathy: Secondary | ICD-10-CM | POA: Diagnosis not present

## 2022-01-28 LAB — GLUCOSE, CAPILLARY
Glucose-Capillary: 102 mg/dL — ABNORMAL HIGH (ref 70–99)
Glucose-Capillary: 129 mg/dL — ABNORMAL HIGH (ref 70–99)
Glucose-Capillary: 89 mg/dL (ref 70–99)
Glucose-Capillary: 93 mg/dL (ref 70–99)
Glucose-Capillary: 99 mg/dL (ref 70–99)

## 2022-01-28 LAB — AEROBIC/ANAEROBIC CULTURE W GRAM STAIN (SURGICAL/DEEP WOUND): Culture: NO GROWTH

## 2022-01-28 LAB — URINE CULTURE: Culture: >=100000 — AB

## 2022-01-28 NOTE — NC FL2 (Signed)
[place] LEVEL OF CARE SCREENING TOOL     IDENTIFICATION  Patient Name: Martin Gilbert Birthdate: Jan 14, 1941 Sex: male Admission Date (Current Location): 01/21/2022  [facility] and [place] Number:  Engineering geologist and Address:  [facility], [address] [place], [place] [address]      Provider Number: [id number]  Attending Physician Name and Address:  Edwin Dada, *  Relative Name and Phone Number:       Current Level of Care: Hospital Recommended Level of Care: [place] Prior Approval Number:    Date Approved/Denied:   PASRR Number: [id number]  Discharge Plan: SNF    Current Diagnoses: Patient Active Problem List   Diagnosis Date Noted   Hemothorax on right    Gout 01/26/2022   Elevated troponin 01/24/2022   Acute respiratory failure with hypoxia ([place]) 01/24/2022   Pressure injury of right buttock, stage 2 ([place]) 01/24/2022   Pleural effusion 01/21/2022   Acute prostatitis    Urinary retention    Chronic diastolic CHF (congestive heart failure) ([name])    Fluid overload 12/09/2021   Acute pulmonary edema ([name])    Cough    Achilles tendinitis, left leg    Malnutrition of moderate degree 10/26/2021   Anemia of chronic kidney failure, stage 5 ([place]) 10/25/2021   Diabetes mellitus, type II ([place]) 10/25/2021   History of recent fall 10/25/2021   Hyperammonemia ([place]) 10/25/2021   Hepatic cirrhosis ([place]) 10/25/2021   Inadequate oral intake 10/25/2021   Norovirus 04/15/2021   Hypotension    Dehydration    Hypomagnesemia    Acute colitis 96/29/5284   Acute metabolic encephalopathy 13/24/4010   Acute gastroenteritis 10/11/2018   Intractable nausea and vomiting 10/10/2018   Sacral fracture ([place]) 27/25/3664   Complication of arteriovenous dialysis fistula 09/01/2017   ESRD (end stage renal disease) ([place]) 06/03/2017   Colitis 03/27/2017   Demand ischemia ([place]) 03/27/2017   Enteritis due to  Clostridium difficile    Protein-calorie malnutrition, severe 02/18/2017   C. difficile colitis 02/15/2017   UTI (urinary tract infection) 02/02/2017   E coli infection 02/02/2017   Lactic acidosis 02/02/2017   Diabetic neuropathy ([place]) 02/02/2017   Left-sided chest wall pain 02/02/2017   Fall 02/02/2017   Pink eye, left 02/02/2017   Leukocytosis 02/02/2017   Anemia of chronic disease 02/02/2017   Nausea and vomiting 02/02/2017   Left foot pain 10/08/2016   Osteoarthritis of knee 10/08/2016   Hypertension 11/28/2015   Chronic gout due to renal impairment, right ankle and foot, without tophus (tophi) 07/31/2015   Bone lesion 06/05/2015   Primary osteoarthritis of both knees 06/05/2015   Pain in the chest 02/15/2015   Neck pain 02/15/2015   Diabetes type 2, uncontrolled 02/15/2015   Frequent headaches 02/15/2015   Type 2 diabetes mellitus with hyperglycemia ([place]) 02/15/2015   Hypertrophy of prostate with urinary obstruction and other lower urinary tract symptoms (LUTS) 09/07/2013    Orientation RESPIRATION BLADDER Height & Weight     Self, Time, Situation, Place  Normal Continent, Indwelling catheter Weight: 161 lb 9.6 oz (73.3 kg) Height:  5\' 5"  (165.1 cm)  BEHAVIORAL SYMPTOMS/MOOD NEUROLOGICAL BOWEL NUTRITION STATUS   (None)  (None) Continent Diet (Renal with fluid restriction: 1200 mL.)  AMBULATORY STATUS COMMUNICATION OF NEEDS Skin   Extensive Assist Verbally Bruising, PU Stage and Appropriate Care   PU Stage 2 Dressing:  (Right buttocks: Foam prn.)  Personal Care Assistance Level of Assistance  Bathing, Feeding, Dressing Bathing Assistance: Maximum assistance Feeding assistance: Limited assistance Dressing Assistance: Maximum assistance     Functional Limitations Info  Hearing, Sight, Speech Sight Info: Adequate Hearing Info: Adequate Speech Info: Adequate    SPECIAL CARE FACTORS FREQUENCY  PT (By licensed PT), OT (By licensed OT)     PT  Frequency: 5 x week OT Frequency: 5 x week            Contractures Contractures Info: Not present    Additional Factors Info  Code Status, Allergies, Isolation Precautions Code Status Info: DNR Allergies Info: NKDA     Isolation Precautions Info: Contact: ESBL. Currently on airborne to rule out TB. Per MD, "low clinical suspiscion." So far tests have been negative. Last sputum pending (#3).     Current Medications (01/28/2022):  This is the current hospital active medication list Current Facility-Administered Medications  Medication Dose Route Frequency Provider Last Rate Last Admin   0.9 %  sodium chloride infusion (Manually program via Guardrails IV Fluids)   Intravenous Once [place], Carole N, DO       0.9 %  sodium chloride infusion  250 mL Intravenous PRN Agbata, Tochukwu, MD       acetaminophen (TYLENOL) tablet 650 mg  650 mg Oral Q6H PRN Agbata, Tochukwu, MD   650 mg at 01/26/22 0620   allopurinol (ZYLOPRIM) tablet 50 mg  50 mg Oral Daily Agbata, Tochukwu, MD   50 mg at 01/28/22 0859   alteplase (CATHFLO ACTIVASE) injection 2 mg  2 mg Intracatheter Once PRN Colon Flattery, NP       atorvastatin (LIPITOR) tablet 20 mg  20 mg Oral Daily Agbata, Tochukwu, MD   20 mg at 01/28/22 0855   Chlorhexidine Gluconate Cloth 2 % PADS 6 each  6 each Topical Q0600 Colon Flattery, NP   6 each at 01/28/22 0436   cholecalciferol (VITAMIN D) tablet 1,000 Units  1,000 Units Oral Daily Agbata, Tochukwu, MD   1,000 Units at 01/28/22 0855   epoetin alfa (EPOGEN) injection 10,000 Units  10,000 Units Intravenous Q T,Th,Sa-HD Breeze, Benancio Deeds, NP   10,000 Units at 01/26/22 1015   famotidine (PEPCID) tablet 20 mg  20 mg Oral Daily Agbata, Tochukwu, MD   20 mg at 01/28/22 0858   ferrous sulfate tablet 325 mg  325 mg Oral BID WC Agbata, Tochukwu, MD   325 mg at 01/28/22 0855   guaiFENesin (ROBITUSSIN) 100 MG/5ML liquid 10 mL  10 mL Oral Q4H PRN Agbata, Tochukwu, MD   10 mL at 01/27/22 1007    haloperidol lactate (HALDOL) injection 1 mg  1 mg Intravenous Q6H PRN Starla Link, Kshitiz, MD   1 mg at 01/23/22 0916   heparin injection 1,000 Units  1,000 Units Dialysis PRN Colon Flattery, NP       heparin injection 5,000 Units  5,000 Units Subcutaneous Q8H Hall, Carole N, DO   5,000 Units at 01/28/22 0435   ipratropium-albuterol (DUONEB) 0.5-2.5 (3) MG/3ML nebulizer solution 3 mL  3 mL Nebulization Q4H PRN Alekh, Kshitiz, MD       lactulose (CHRONULAC) 10 GM/15ML solution 20 g  20 g Oral TID Aline August, MD   20 g at 01/27/22 2123   lidocaine (PF) (XYLOCAINE) 1 % injection 5 mL  5 mL Intradermal PRN Colon Flattery, NP       lidocaine-prilocaine (EMLA) cream 1 application  1 application Topical PRN Colon Flattery, NP  LORazepam (ATIVAN) injection 0.5 mg  0.5 mg Intravenous Q6H PRN Aline August, MD       melatonin tablet 2.5 mg  2.5 mg Oral QHS PRN Irene Pap N, DO   2.5 mg at 01/26/22 2125   meropenem (MERREM) 500 mg in sodium chloride 0.9 % 100 mL IVPB  500 mg Intravenous Daily Edwin Dada, MD 200 mL/hr at 01/27/22 1859 500 mg at 01/27/22 1859   midodrine (PROAMATINE) tablet 5 mg  5 mg Oral TID WC Kolluru, Sarath, MD   5 mg at 01/28/22 1210   oxyCODONE (Oxy IR/ROXICODONE) immediate release tablet 5 mg  5 mg Oral Q6H PRN Irene Pap N, DO   5 mg at 01/27/22 2140   pentafluoroprop-tetrafluoroeth (GEBAUERS) aerosol 1 application  1 application Topical PRN Colon Flattery, NP       prochlorperazine (COMPAZINE) injection 5 mg  5 mg Intravenous Q6H PRN Irene Pap N, DO       senna-docusate (Senokot-S) tablet 2 tablet  2 tablet Oral QHS Irene Pap N, DO   2 tablet at 01/27/22 2123   sodium chloride flush (NS) 0.9 % injection 3 mL  3 mL Intravenous Q12H Agbata, Tochukwu, MD   3 mL at 01/28/22 0859   sodium chloride flush (NS) 0.9 % injection 3 mL  3 mL Intravenous PRN Agbata, Tochukwu, MD       tamsulosin (FLOMAX) capsule 0.4 mg  0.4 mg Oral QPC supper Agbata, Tochukwu,  MD   0.4 mg at 01/27/22 0321     Discharge Medications: Please see discharge summary for a list of discharge medications.  Relevant Imaging Results:  Relevant Lab Results:   Additional Information SS#: [id number]. HD TTS Shanon Payor 5:45 am.  Candie Chroman, LCSW

## 2022-01-28 NOTE — Progress Notes (Signed)
Palliative: Chart review completed. Conference with attending related to patient condition, needs, goals of care.  Plan: At this point continue to treat the treatable but no CPR or intubation.  Short-term rehab if possible.  No charge Quinn Axe, NP Palliative medicine team Team phone [phone] Greater than 50% of this time was spent counseling and coordinating care related to the above assessment and plan.

## 2022-01-28 NOTE — Treatment Plan (Signed)
Diagnosis: ESBL e.coli prostate abscesses Baseline Creatinine ESRD on dialysis   No Known Allergies  OPAT Orders Discharge antibiotics: Ertapenem 500mg  IV every 24 hours for a total of 6 weeks End Date: 03/07/22   [facility] Care Per Protocol:  Labs weekly while on IV antibiotics: _X_ CBC with differential  _X_ CMP    __ Please Schedule removal of IJ PICC with Intervention radiology [facility] on 03/08/22   Fax weekly labs to [phone]  Clinic Follow Up Appt: 03/07/22 at 42.30AM   Call [phone] with any critical values or questions

## 2022-01-28 NOTE — Progress Notes (Signed)
[facility]  ROUNDING NOTE   Subjective:   [name] was admitted to [facility] on 01/21/2022 for Urinary retention [R33.9] Back pain [M54.9] UTI (urinary tract infection) [N39.0] Pleural effusion [J90] Pleural effusion on right [J90] Acute cystitis with hematuria [N30.01] Chest pain, unspecified type [R07.9] Sepsis, due to unspecified organism, unspecified whether acute organ dysfunction present [facility]) [A41.9] Acute cough [R05.1]  Last hemodialysis treatment was Saturday, February 4. Left at 72.5kg - below his dry weight.  History is difficult to get even with [place] Interpreter.   Patient seen sitting up in bed, daughter at bedside Alert and oriented to person and place Continues to complain of lower midline abdominal pain. Tolerating small meals and denies shortness of breath  Objective:  Vital signs in last 24 hours:  Temp:  [97.4 F (36.3 C)-98.8 F (37.1 C)] 97.4 F (36.3 C) (02/13 0853) Pulse Rate:  [62-74] 74 (02/13 1210) Resp:  [16-20] 18 (02/13 0853) BP: (129-149)/(49-79) 149/53 (02/13 1210) SpO2:  [96 %-99 %] 99 % (02/13 1210) Weight:  [73.3 kg] 73.3 kg (02/13 0435)  Weight change: -5.8 kg Filed Weights   01/26/22 1252 01/27/22 0544 01/28/22 0435  Weight: 76 kg 76.8 kg 73.3 kg    Intake/Output: No intake/output data recorded.   Intake/Output this shift:  No intake/output data recorded.  Physical Exam: General: NAD,resting in bed  Head: Normocephalic, atraumatic. Moist oral mucosal membranes  Eyes: Anicteric  Lungs:  Basilar rales, normal effort, cough, Right sided Chest tube  Heart: Regular rate and rhythm  Abdomen:  Suprapubic tenderness  Extremities:  1+ peripheral edema.  Neurologic: Nonfocal, moving all four extremities  Skin: No lesions   Access: Left AVF  GU Foley  Basic Metabolic Panel: Recent Labs  Lab 01/22/22 0424 01/23/22 0434 01/24/22 0627 01/26/22 0525  NA 130* 133* 135 132*  K 4.3 3.7 4.1 3.7  CL 97*  98 99 95*  CO2 23 26 23 24   GLUCOSE 103* 123* 85 98  BUN 74* 39* 46* 36*  CREATININE 7.52* 5.17* 6.63* 6.09*  CALCIUM 7.8* 7.7* 7.1* 7.0*  MG  --   --  1.7  --   PHOS  --  2.7  --   --      Liver Function Tests: Recent Labs  Lab 01/23/22 0434 01/24/22 0627  AST  --  25  ALT  --  13  ALKPHOS  --  331*  BILITOT  --  1.4*  PROT  --  5.5*  ALBUMIN 1.8* 1.6*    No results for input(s): LIPASE, AMYLASE in the last 168 hours.  Recent Labs  Lab 01/23/22 0956 01/24/22 0627  AMMONIA 37* 41*     CBC: Recent Labs  Lab 01/22/22 0424 01/22/22 1029 01/23/22 0434 01/24/22 0627 01/25/22 0439 01/26/22 0525  WBC 8.1  --  11.0* 15.1* 10.3 9.0  NEUTROABS  --   --   --  12.3*  --   --   HGB 7.2* 7.2* 8.5* 8.0* 8.1* 8.3*  HCT 21.7* 21.8* 26.0* 24.0* 24.2* 25.4*  MCV 94.8  --  94.2 92.7 92.0 93.4  PLT 216  --  198 176 189 165     Cardiac Enzymes: No results for input(s): CKTOTAL, CKMB, CKMBINDEX, TROPONINI in the last 168 hours.  BNP: Invalid input(s): POCBNP  CBG: Recent Labs  Lab 01/27/22 1707 01/27/22 2011 01/28/22 0017 01/28/22 0442 01/28/22 0842  GLUCAP 97 148* 102* 99 93     Microbiology: Results for orders placed or  performed during the hospital encounter of 01/21/22  Urine Culture     Status: Abnormal   Collection Time: 01/21/22 10:25 AM   Specimen: Urine, Random  Result Value Ref Range Status   Specimen Description   Final    URINE, RANDOM Performed at [facility], [address], [place], [place] [address]    Special Requests   Final    NONE Performed at [facility], [address] [place], [place] [address]    Culture (A)  Final    >=100,000 COLONIES/mL ESCHERICHIA COLI CORRECTED ON 02/10 AT 1430: PREVIOUSLY REPORTED AS MULTIPLE SPECIES PRESENT, SUGGEST RECOLLECTION Confirmed Extended Spectrum Beta-Lactamase Producer (ESBL).  In bloodstream infections from ESBL organisms, carbapenems are preferred over  piperacillin/tazobactam. They are shown to have a lower risk of mortality.    Report Status 01/28/2022 FINAL  Final   Organism ID, Bacteria ESCHERICHIA COLI (A)  Final      Susceptibility   Escherichia coli - MIC*    AMPICILLIN >=32 RESISTANT Resistant     CEFAZOLIN >=64 RESISTANT Resistant     CEFEPIME 16 RESISTANT Resistant     CEFTRIAXONE >=64 RESISTANT Resistant     CIPROFLOXACIN >=4 RESISTANT Resistant     GENTAMICIN >=16 RESISTANT Resistant     IMIPENEM <=0.25 SENSITIVE Sensitive     NITROFURANTOIN <=16 SENSITIVE Sensitive     TRIMETH/SULFA >=320 RESISTANT Resistant     AMPICILLIN/SULBACTAM >=32 RESISTANT Resistant     PIP/TAZO 8 SENSITIVE Sensitive     * >=100,000 COLONIES/mL ESCHERICHIA COLI CORRECTED ON 02/10 AT 1430: PREVIOUSLY REPORTED AS MULTIPLE SPECIES PRESENT, SUGGEST RECOLLECTION  Blood Culture (routine x 2)     Status: None   Collection Time: 01/21/22  1:12 PM   Specimen: BLOOD  Result Value Ref Range Status   Specimen Description BLOOD BLOOD RIGHT ARM  Final   Special Requests   Final    BOTTLES DRAWN AEROBIC AND ANAEROBIC Blood Culture adequate volume   Culture   Final    NO GROWTH 5 DAYS Performed at [facility], [address] [place], [place] [address]    Report Status 01/26/2022 FINAL  Final  Blood Culture (routine x 2)     Status: None   Collection Time: 01/21/22  1:12 PM   Specimen: BLOOD  Result Value Ref Range Status   Specimen Description BLOOD BLOOD RIGHT FOREARM  Final   Special Requests   Final    BOTTLES DRAWN AEROBIC AND ANAEROBIC Blood Culture adequate volume   Culture   Final    NO GROWTH 5 DAYS Performed at [facility], [address] [place], [place] [address]    Report Status 01/26/2022 FINAL  Final  Acid Fast Smear (AFB)     Status: None   Collection Time: 01/21/22  4:02 PM   Specimen: Chest; Body Fluid  Result Value Ref Range Status   AFB Specimen Processing Concentration  Final   Acid Fast Smear Negative   Final    Comment: (NOTE) Performed At: [facility] [place], [place] [id number] Rush Farmer MD RF:[id number]    Source (AFB) NONE  Final    Comment: Performed at [facility], [address] [place], [place] [address]  Body fluid culture w Gram Stain     Status: None   Collection Time: 01/21/22  4:02 PM   Specimen: Chest; Body Fluid  Result Value Ref Range Status   Specimen Description   Final  CHEST FLUID Performed at [facility], [place] [address] [place], [place] [address]    Special Requests   Final    NONE Performed at [facility], [address] [place], [place] [address]    Gram Stain   Final    NO WBC SEEN NO ORGANISMS SEEN Performed at [facility], [place] [address], [place], [place] [address]    Culture   Final    FEW ESCHERICHIA COLI Confirmed Extended Spectrum Beta-Lactamase Producer (ESBL).  In bloodstream infections from ESBL organisms, carbapenems are preferred over piperacillin/tazobactam. They are shown to have a lower risk of mortality.    Report Status 01/25/2022 FINAL  Final   Organism ID, Bacteria ESCHERICHIA COLI  Final      Susceptibility   Escherichia coli - MIC*    AMPICILLIN >=32 RESISTANT Resistant     CEFAZOLIN >=64 RESISTANT Resistant     CEFEPIME 16 RESISTANT Resistant     CEFTAZIDIME RESISTANT Resistant     CEFTRIAXONE >=64 RESISTANT Resistant     CIPROFLOXACIN >=4 RESISTANT Resistant     GENTAMICIN >=16 RESISTANT Resistant     IMIPENEM <=0.25 SENSITIVE Sensitive     TRIMETH/SULFA >=320 RESISTANT Resistant     AMPICILLIN/SULBACTAM >=32 RESISTANT Resistant     PIP/TAZO 8 SENSITIVE Sensitive     * FEW ESCHERICHIA COLI  Aerobic/Anaerobic Culture w Gram Stain (surgical/deep wound)     Status: None   Collection Time: 01/22/22  3:56 PM   Specimen: Pleural Fluid  Result Value Ref Range Status   Specimen Description   Final    PLEURAL RIGHT Performed at [facility], [address], [place], [place] [address]    Special Requests   Final    PLE FLU Performed at [facility], [address] [place], [place] [address]    Gram Stain   Final    RARE WBC PRESENT, PREDOMINANTLY MONONUCLEAR NO ORGANISMS SEEN    Culture   Final    No growth aerobically or anaerobically. Performed at [facility], [place] [address], [place], [place] [address]    Report Status 01/28/2022 FINAL  Final  Resp Panel by RT-PCR (Flu A&B, Covid) Nasopharyngeal Swab     Status: None   Collection Time: 01/23/22  4:52 PM   Specimen: Nasopharyngeal Swab; Nasopharyngeal(NP) swabs in vial transport medium  Result Value Ref Range Status   SARS Coronavirus 2 by RT PCR NEGATIVE NEGATIVE Final    Comment: (NOTE) SARS-CoV-2 target nucleic acids are NOT DETECTED.  The SARS-CoV-2 RNA is generally detectable in upper respiratory specimens during the acute phase of infection. The lowest concentration of SARS-CoV-2 viral copies this assay can detect is 138 copies/mL. A negative result does not preclude SARS-Cov-2 infection and should not be used as the sole basis for treatment or other patient management decisions. A negative result may occur with  improper specimen collection/handling, submission of specimen other than nasopharyngeal swab, presence of viral mutation(s) within the areas targeted by this assay, and inadequate number of viral copies(<138 copies/mL). A negative result must be combined with clinical observations, patient history, and epidemiological information. The expected result is Negative.  Fact Sheet for Patients:  EntrepreneurPulse.com.au  Fact Sheet for Healthcare Providers:  IncredibleEmployment.be  This test is no t yet approved or cleared by the [place] FDA and  has been authorized for detection and/or diagnosis of SARS-CoV-2 by FDA under an Emergency Use Authorization (EUA). This EUA will remain  in effect (meaning  this test can be used) for the duration of the COVID-19 declaration under Section 564(b)(1) of the Act, 21 U.S.C.section 360bbb-3(b)(1), unless the authorization is terminated  or revoked sooner.       Influenza A by PCR NEGATIVE NEGATIVE Final   Influenza B by PCR NEGATIVE NEGATIVE Final    Comment: (NOTE) The Xpert Xpress SARS-CoV-2/FLU/RSV plus assay is intended as an aid in the diagnosis of influenza from Nasopharyngeal swab specimens and should not be used as a sole basis for treatment. Nasal washings and aspirates are unacceptable for Xpert Xpress SARS-CoV-2/FLU/RSV testing.  Fact Sheet for Patients: EntrepreneurPulse.com.au  Fact Sheet for Healthcare Providers: IncredibleEmployment.be  This test is not yet approved or cleared by the [place] FDA and has been authorized for detection and/or diagnosis of SARS-CoV-2 by FDA under an Emergency Use Authorization (EUA). This EUA will remain in effect (meaning this test can be used) for the duration of the COVID-19 declaration under Section 564(b)(1) of the Act, 21 U.S.C. section 360bbb-3(b)(1), unless the authorization is terminated or revoked.  Performed at [facility], [address], [place], [place] [address]   Aerobic/Anaerobic Culture w Gram Stain (surgical/deep wound)     Status: None (Preliminary result)   Collection Time: 01/25/22 12:00 PM   Specimen: Pleura  Result Value Ref Range Status   Specimen Description   Final    PLEURAL Performed at [facility], [address] [place], [place] [address]    Special Requests   Final    RIGHT CHEST TUBE Performed at [facility], [place], [place] [address]    Gram Stain NO WBC SEEN NO ORGANISMS SEEN   Final   Culture   Final    NO GROWTH 3 DAYS NO ANAEROBES ISOLATED; CULTURE IN PROGRESS FOR 5 DAYS Performed at [facility], [place] [address], [place], [place] [address]     Report Status PENDING  Incomplete  Acid Fast Smear (AFB)     Status: None   Collection Time: 01/25/22 12:05 PM   Specimen: Sputum  Result Value Ref Range Status   AFB Specimen Processing Concentration  Final   Acid Fast Smear Negative  Final    Comment: (NOTE) Performed At: [facility] [place], [place] [id number] Rush Farmer MD IR:[id number]    Source (AFB) PLEURAL  Final    Comment: Performed at [facility], [address] [place], [place] [address]    Coagulation Studies: No results for input(s): LABPROT, INR in the last 72 hours.   Urinalysis: No results for input(s): COLORURINE, LABSPEC, PHURINE, GLUCOSEU, HGBUR, BILIRUBINUR, KETONESUR, PROTEINUR, UROBILINOGEN, NITRITE, LEUKOCYTESUR in the last 72 hours.  Invalid input(s): APPERANCEUR     Imaging: No results found.   Medications:    sodium chloride     meropenem (MERREM) IV 500 mg (01/27/22 1859)    sodium chloride   Intravenous Once   allopurinol  50 mg Oral Daily   atorvastatin  20 mg Oral Daily   Chlorhexidine Gluconate Cloth  6 each Topical Q0600   cholecalciferol  1,000 Units Oral Daily   epoetin (EPOGEN/PROCRIT) injection  10,000 Units Intravenous Q T,Th,Sa-HD   famotidine  20 mg Oral Daily   ferrous sulfate  325 mg Oral BID WC   heparin injection (subcutaneous)  5,000 Units Subcutaneous Q8H   lactulose  20 g Oral TID   midodrine  5 mg Oral TID WC   senna-docusate  2  tablet Oral QHS   sodium chloride flush  3 mL Intravenous Q12H   tamsulosin  0.4 mg Oral QPC supper     Assessment/ Plan:  [name] is a 81 y.o. Hispanic male who speaks [place] and limited [place] and [place].  Patient with end stage renal disease on hemodialysis, hypertension, BPH, diabetes mellitus type II, hyperlipidemia who [facility] to [facility] for Urinary retention [R33.9] Back pain [M54.9] UTI (urinary tract infection) [N39.0] Pleural effusion [J90] Pleural  effusion on right [J90] Acute cystitis with hematuria [N30.01] Chest pain, unspecified type [R07.9] Sepsis, due to unspecified organism, unspecified whether acute organ dysfunction present ([place]) [A41.9] Acute cough [R05.1]   [facility] TTS [facility] Left AVF 73kg.    End stage renal disease: Continue TTS schedule. Next treatment scheduled for Tuesday.    Anemia of chronic kidney disease: hemoglobin 8.3, below desired target  - EPO with dialysis treatments   Hypertension: .Home regimen includes hydralazine, metoprolol and tamsulosin.  BP 149/53   Secondary Hyperparathyroidism: Continue cholecalciferol daily.   -Calcium remains decreased at 7.0. Will continue to monitor  5.  Right pleural effusion likely due to rib fracture.  Posttraumatic hemorrhagic pleural effusion.  Right chest tube placed on 01/22/2022.  Testing sent on drainage.  Found to have e coli. Pulmonology and ID  following.  -Receiving Meropenem.  - Family has decided not to proceed with tPa - Plan to d/c chest tube today and possibly discharge to SNF tomorrow    LOS: 7 [place] 2/13/20231:47 PM

## 2022-01-28 NOTE — Progress Notes (Signed)
PHARMACY CONSULT NOTE FOR:  OUTPATIENT  PARENTERAL ANTIBIOTIC THERAPY (OPAT)  Indication: ESBL e.coli prostate abscesses; Baseline Creatinine ESRD on dialysis Regimen: Ertapenem 500 mg IV q24h for a total of 6 weeks End date: 03/07/22  IV antibiotic discharge orders are pended. To discharging provider:  please sign these orders via discharge navigator,  Select New Orders & click on the button choice - Manage This Unsigned Work.     Thank you for allowing pharmacy to be a part of this patient's care.  Martin Gilbert 01/28/2022, 4:39 PM

## 2022-01-28 NOTE — Progress Notes (Signed)
Date of Admission:  01/21/2022     ID: Martin Gilbert is a 81 y.o. male  Principal Problem:   Acute prostatitis Active Problems:   UTI (urinary tract infection)   Anemia of chronic disease   ESRD (end stage renal disease) ([name])   Acute metabolic encephalopathy   Diabetes mellitus, type II ([name])   Hepatic cirrhosis ([name])   Chronic diastolic CHF (congestive heart failure) ([name])   Pleural effusion   Urinary retention   Elevated troponin   Acute respiratory failure with hypoxia ([name])   Pressure injury of right buttock, stage 2 ([name])   Gout   Hemothorax on right    Subjective: Feeling better Pain abdomen less Appetite better   Medications:   sodium chloride   Intravenous Once   allopurinol  50 mg Oral Daily   atorvastatin  20 mg Oral Daily   Chlorhexidine Gluconate Cloth  6 each Topical Q0600   cholecalciferol  1,000 Units Oral Daily   epoetin (EPOGEN/PROCRIT) injection  10,000 Units Intravenous Q T,Th,Sa-HD   famotidine  20 mg Oral Daily   ferrous sulfate  325 mg Oral BID WC   heparin injection (subcutaneous)  5,000 Units Subcutaneous Q8H   lactulose  20 g Oral TID   midodrine  5 mg Oral TID WC   senna-docusate  2 tablet Oral QHS   sodium chloride flush  3 mL Intravenous Q12H   tamsulosin  0.4 mg Oral QPC supper    Objective: Vital signs in last 24 hours: Temp:  [97.4 F (36.3 C)-98.8 F (37.1 C)] 97.4 F (36.3 C) (02/13 0853) Pulse Rate:  [62-74] 74 (02/13 1210) Resp:  [16-20] 18 (02/13 0853) BP: (129-149)/(49-79) 149/53 (02/13 1210) SpO2:  [96 %-99 %] 99 % (02/13 1210) Weight:  [73.3 kg] 73.3 kg (02/13 0435)  PHYSICAL EXAM:  General: Alert, cooperative, no distress, chronically ill head: Normocephalic, without obvious abnormality, atraumatic. Eyes: Conjunctivae clear, anicteric sclerae. Pupils are equal ENT Nares normal. No drainage or sinus tenderness. Lips, mucosa, and tongue normal. No Thrush Neck: Supple, symmetrical, no adenopathy, thyroid:  non tender no carotid bruit and no JVD. Back: No CVA tenderness. Lungs: Right chest tube present Decreased air entry Heart: Regular rate and rhythm, no murmur, rub or gallop. Abdomen: Soft, non-tender,not distended. Bowel sounds normal. No masses Foley present Extremities: Right forearm fistula skin: No rashes or lesions. Or bruising Lymph: Cervical, supraclavicular normal. Neurologic: Grossly non-focal  Lab Results Recent Labs    01/26/22 0525  WBC 9.0  HGB 8.3*  HCT 25.4*  NA 132*  K 3.7  CL 95*  CO2 24  BUN 36*  CREATININE 6.09*    Microbiology: Urine culture ESBL E. coli Pleural fluid aspiration ESBL E. coli and Studies/Results: No results found.   Assessment/Plan: Prostatic abscesses. ESBL E. coli in urine culture Will need meropenem for a total of 6 weeks from discharge she will get ertapenem 5 mg every day status 03/07/2022. Because of end-stage renal disease PICC cannot be placed and he is going to need tunnel catheter for IV antibiotics at home  Right loculated pleural effusion: Aspiration culture was positive for ESBL E. coli but repeat cultures have been negative.  He is anyway going to be treated for this bacteria as it is present in his urine culture and the cause for the prostatic abscesses.  QuantiFERON gold positive for 2 sputum's for AFB has been negative.  Also less likely to be tuberculosis.    End-stage disease on  Anemia of  chronic disease  CHF  OPAT orders has been placed.  Need to discuss with his daughter about doing IV antibiotics at home

## 2022-01-28 NOTE — Progress Notes (Signed)
Progress Note   Patient: Martin Gilbert NTI:[id number] DOB: [birth year] DOA: 01/21/2022     7 DOS: the patient was seen and examined on 01/28/2022       Brief hospital course: [name] is an 81 y.o. M with ESRD on HD, HTN, DM, dCHF who presented with abdominal pain for 3 weeks.  In the ER, urinalysis (still makes significant urine) showed UTI.  Imaging showed low-density area near prostate concerning ofr prostatitis vs abscess.  Urology consulted and admitted on antibiotics.  Also noted to have loculated RIGHT pleural effusion.   2/6: Admitted on antibiotics, had diagnostic thoracentesis 2/7: CT surgery recommended pigtail catheter, placed 2nd day 2/8: Developed delirium 2/10: Chest fluid culture growing ESBL E coli 2/11: Delirium completely resolved 2/12: Family have elected not to do tPA in pleural catheter       Assessment and Plan: * Acute prostatitis Patient presented with suprapubic pain, difficulty urinating.  CAT scan showed a small lucency, possible prostatitis versus prostatic abscess.  Urology were consulted, recommended medical treatment and outpatient follow-up.  Sepsis was ruled out.  Urine culture with ESBL - Continue meropenem, day 8 of antibiotics - Consult ID, appreciate cares  - Continue Flomax - Foley placed on admission --> follow-up with urology Martin Gilbert after discharge       Acute respiratory failure with hypoxia ([place]) At baseline the patient does not use oxygen.  Here he required up to 5 and 6 L of oxygen at times due to effusion, now weaned to room air.     Pleural effusion Discussed with CT surgery, not surgical candidate.  Shared decision making was discussed with family, elected to remove chest tube, do-nothing.  Given initial uncertainty about tuberculosis, "appearing gold was obtained which showed evidence of prior exposure.  We are currently ruling out TB with 3 acid-fast smears.  So far we have collected 2, the third is  pending.  Low clinical suspicion for TB      Acute metabolic encephalopathy- (present on admission) Likely due to infection, hypoxia.  Hard of hearing and language barrier complicates evaluation.    Continues to have some improving waxing and waning delirium, very mild, mostly at night.  Chronic diastolic CHF (congestive heart failure) ([place])- (present on admission) Acute CHF ruled out. - Minimize fluids   Gout -Continue allopurinol  Pressure injury of right buttock, stage 2 ([place])- (present on admission) - Mepilex dressing    Hepatic cirrhosis ([place])- (present on admission) Per imaging.  No clinical signs of cirrhosis, and I do not suspect his confusion is hepatic encephalopathy. -Continue midodrine  Diabetes mellitus, type II ([name]) Glucose controlled adequately -Continue statin  ESRD (end stage renal disease) ([place])- (present on admission) - Consult Nephrology, appreciate cares  Anemia of chronic disease- (present on admission) -Continue iron, EPO              Subjective: History collected through video phonic interpreter Still has some pain down in his suprapubic area, no headache, chest pain, dyspnea, hemoptysis.     Physical Exam: Vitals:   01/28/22 0435 01/28/22 0445 01/28/22 0853 01/28/22 1210  BP:  (!) 136/49 (!) 146/56 (!) 149/53  Pulse:  62 62 74  Resp:  16 18   Temp:  98 F (36.7 C) (!) 97.4 F (36.3 C)   TempSrc:  Oral Oral   SpO2:  96% 97% 99%  Weight: 73.3 kg     Height:       Frail elderly male, lying in bed, no  acute distress, interactive Chest tube in place, lung sounds diminished on the right, respiratory effort normal RRR, no murmurs, no lower extremity edema Abdomen soft without tenderness palpation, maybe some mild tenderness in the suprapubic area, no distention or rigidity Moves upper extremities with generalized weakness but symmetric strength, speech fluent      Data Reviewed: Discussed with infectious  disease Laboratory status is notable for normal controlled glucose  Family Communication: Daughter at the bedside  Disposition: Status is: Inpatient  Remains inpatient appropriate because: Remove chest tube today, rule out TB with sputum today, likely medically ready for discharge to SNF tomorrow     Planned Discharge Destination: SNF       Author: Edwin Dada, MD 01/28/2022 1:12 PM  For on call review [email or web address].

## 2022-01-28 NOTE — TOC Initial Note (Signed)
Transition of Care [facility]) - Initial/Assessment Note    Patient Details  Name: Martin Gilbert MRN: [id number] Date of Birth: 1941/05/22  [facility]) CM/SW Contact:    Candie Chroman, LCSW Phone Number: 01/28/2022, 12:47 PM  Clinical Narrative:  Patient on airborne precautions. Called him in the room using [place] interpreter, introduced role, and explained that therapy recommendations would be discussed. Patient is agreeable to SNF placement and gave CSW permission to contact his daughter [name]. She is going to speak with her family regarding SNF vs home health and follow up with decision. She gave CSW permission to send out SNF referral. Patient went to [facility] in November. No further concerns. CSW encouraged patient and his daughter to contact CSW as needed. CSW will continue to follow patient and his family for support and facilitate discharge once medically stable.                Expected Discharge Plan: [place] (vs home health) Barriers to Discharge: Continued Medical Work up   Patient Goals and CMS Choice        Expected Discharge Plan and Services Expected Discharge Plan: [place] (vs home health)     Post Acute Care Choice:  (TBD) Living arrangements for the past 2 months: Single Family Home                                      Prior Living Arrangements/Services Living arrangements for the past 2 months: Single Family Home Lives with:: Spouse Patient language and need for interpreter reviewed:: Yes (Spanish. Interpreter Mateo Flow [phone].) Do you feel safe going back to the place where you live?: Yes      Need for Family Participation in Patient Care: Yes (Comment) Care giver support system in place?: Yes (comment)   Criminal Activity/Legal Involvement Pertinent to Current Situation/Hospitalization: No - Comment as needed  Activities of Daily Living      Permission Sought/Granted Permission sought  to share information with : Facility Sport and exercise psychologist, Family Supports Permission granted to share information with : Yes, Verbal Permission Granted  Share Information with NAME: Rosemarie Ax  Permission granted to share info w AGENCY: SNF's  Permission granted to share info w Relationship: Daughter  Permission granted to share info w Contact Information: [phone]  Emotional Assessment Appearance:: Appears stated age Attitude/Demeanor/Rapport: Engaged, Gracious Affect (typically observed): Accepting, Appropriate, Calm, Pleasant Orientation: : Oriented to Self, Oriented to Place, Oriented to  Time, Oriented to Situation Alcohol / Substance Use: Not Applicable Psych Involvement: No (comment)  Admission diagnosis:  Urinary retention [R33.9] Back pain [M54.9] UTI (urinary tract infection) [N39.0] Pleural effusion [J90] Pleural effusion on right [J90] Acute cystitis with hematuria [N30.01] Chest pain, unspecified type [R07.9] Sepsis, due to unspecified organism, unspecified whether acute organ dysfunction present [facility]) [A41.9] Acute cough [R05.1] Patient Active Problem List   Diagnosis Date Noted   Hemothorax on right    Gout 01/26/2022   Elevated troponin 01/24/2022   Acute respiratory failure with hypoxia ([place]) 01/24/2022   Pressure injury of right buttock, stage 2 ([place]) 01/24/2022   Pleural effusion 01/21/2022   Acute prostatitis    Urinary retention    Chronic diastolic CHF (congestive heart failure) ([name])    Fluid overload 12/09/2021   Acute pulmonary edema ([name])    Cough    Achilles tendinitis, left leg    Malnutrition of moderate degree  10/26/2021   Anemia of chronic kidney failure, stage 5 ([place]) 10/25/2021   Diabetes mellitus, type II ([place]) 10/25/2021   History of recent fall 10/25/2021   Hyperammonemia ([place]) 10/25/2021   Hepatic cirrhosis (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 difficile colitis 02/15/2017   UTI (urinary tract infection) 02/02/2017   E coli infection 02/02/2017   Lactic acidosis 02/02/2017   Diabetic neuropathy ([place]) 02/02/2017   Left-sided chest wall pain 02/02/2017   Fall 02/02/2017   Pink eye, left 02/02/2017   Leukocytosis 02/02/2017   Anemia of chronic disease 02/02/2017   Nausea and vomiting 02/02/2017   Left foot pain 10/08/2016   Osteoarthritis of knee 10/08/2016   Hypertension 11/28/2015   Chronic gout due to renal impairment, right ankle and foot, without tophus (tophi) 07/31/2015   Bone lesion 06/05/2015   Primary osteoarthritis of both knees 06/05/2015   Pain in the chest 02/15/2015   Neck pain 02/15/2015   Diabetes type 2, uncontrolled 02/15/2015   Frequent headaches 02/15/2015   Type 2 diabetes mellitus with hyperglycemia ([place]) 02/15/2015   Hypertrophy of prostate with urinary obstruction and other lower urinary tract symptoms (LUTS) 09/07/2013   PCP:  [name], MD Pharmacy:   [place] [place] [address] Phone: [phone] Fax: [phone]     Social Determinants of Health (SDOH) Interventions    Readmission Risk Interventions Readmission Risk Prevention Plan 04/16/2021  Transportation Screening Complete  Home Care Screening Complete  Medication Review (RN CM) Complete  Some recent data might be hidden

## 2022-01-28 NOTE — Progress Notes (Signed)
PICC order for IJ PICC received. IJ placement requested based on nephrology restriction of bilateral upper arms. Myrene Buddy, MD notified via securechat that IR needed for IJ placements, as IV PICC team can only place in upper arms.

## 2022-01-29 ENCOUNTER — Inpatient Hospital Stay: Payer: Medicare Other | Admitting: Radiology

## 2022-01-29 ENCOUNTER — Inpatient Hospital Stay: Payer: Medicare Other

## 2022-01-29 DIAGNOSIS — J90 Pleural effusion, not elsewhere classified: Secondary | ICD-10-CM | POA: Diagnosis not present

## 2022-01-29 DIAGNOSIS — N3001 Acute cystitis with hematuria: Secondary | ICD-10-CM | POA: Diagnosis not present

## 2022-01-29 DIAGNOSIS — G9341 Metabolic encephalopathy: Secondary | ICD-10-CM | POA: Diagnosis not present

## 2022-01-29 DIAGNOSIS — N410 Acute prostatitis: Secondary | ICD-10-CM | POA: Diagnosis not present

## 2022-01-29 HISTORY — PX: IR PERC TUN PERIT CATH WO PORT S&I /IMAG: IMG2327

## 2022-01-29 HISTORY — PX: IR US GUIDE VASC ACCESS RIGHT: IMG2390

## 2022-01-29 MED ORDER — FENTANYL CITRATE (PF) 100 MCG/2ML IJ SOLN
INTRAMUSCULAR | Status: AC
  Filled 2022-01-29: qty 2

## 2022-01-29 MED ORDER — LIDOCAINE HCL 1 % IJ SOLN
INTRAMUSCULAR | Status: AC
  Filled 2022-01-29: qty 20

## 2022-01-29 MED ORDER — MIDAZOLAM HCL 2 MG/2ML IJ SOLN
INTRAMUSCULAR | Status: AC
  Filled 2022-01-29: qty 2

## 2022-01-29 MED ORDER — SODIUM CHLORIDE 0.9 % IV SOLN
500.0000 mg | INTRAVENOUS | Status: DC
  Administered 2022-01-30 – 2022-02-04 (×6): 500 mg via INTRAVENOUS
  Filled 2022-01-29 (×7): qty 0.5

## 2022-01-29 MED ORDER — MIDAZOLAM HCL 2 MG/2ML IJ SOLN
INTRAMUSCULAR | Status: AC | PRN
  Administered 2022-01-29: 1 mg via INTRAVENOUS

## 2022-01-29 MED ORDER — LIDOCAINE HCL 1 % IJ SOLN
INTRAMUSCULAR | Status: DC | PRN
  Administered 2022-01-29: 9 mL via INTRADERMAL

## 2022-01-29 MED ORDER — FENTANYL CITRATE (PF) 100 MCG/2ML IJ SOLN
INTRAMUSCULAR | Status: AC | PRN
Start: 2022-01-29 — End: 2022-01-29
  Administered 2022-01-29: 50 ug via INTRAVENOUS

## 2022-01-29 NOTE — Progress Notes (Signed)
Referring Physician(s): Dr. Francine Graven  Supervising Physician: Corrie Mckusick  Patient Status:  Martin Gilbert - In-pt  Chief Complaint:  Loculated effusion Need for long term IV antibiotics  Brief History:  Martin Gilbert is an 81 y.o. male with medical issues including HTN, DM, CHF and ESRD on hemodialysis.   He presented to the [facility] ED 01/21/22 with complaints of suprapubic discomfort and decreased urine output. Lab work up revealed a urinary tract infection.   Imaging showed a possible malignancy and a loculated right pleural effusion.  He was seen in [place] for a thoracentesis on 01/21/22. The fluid on ultrasound was compatible with a post-traumatic hemothorax (posterior rib fractures identified on recent CT) and only 10 ml of serosanguineous fluid was able to be removed.   Dr. Kipp Brood with CT surgery was consulted and he recommended pigtail catheter placement for pulmonary to manage.   He underwent placement of a right pigtail chest tube on 01/22/22 by Dr. Annamaria Boots.  Pleural fluid cultures revealed ESBL and E. Coli.  ID has asked [place] to place a  tunneled central venous catheter for 6 weeks of antibiotics.  We are also asked to remove the pigtail chest tube today.  Subjective:  No complaints.  Allergies: Patient has no known allergies.  Medications: Prior to Admission medications   Medication Sig Start Date End Date Taking? Authorizing Provider  acetaminophen (TYLENOL) 325 MG tablet Take 2 tablets (650 mg total) by mouth every 6 (six) hours as needed for mild pain (or Fever >/= 101). 11/28/17  Yes Gouru, Illene Silver, MD  allopurinol (ZYLOPRIM) 100 MG tablet Take 0.5 tablets (50 mg total) by mouth daily. Patient taking differently: Take 100 mg by mouth daily. 10/29/21  Yes Wouk, Ailene Rud, MD  ferrous sulfate 325 (65 FE) MG tablet Take 325 mg by mouth 2 (two) times daily with a meal.  08/30/15  Yes [provider]  hydrALAZINE (APRESOLINE) 50 MG tablet Take 50 mg by mouth 3  (three) times daily. 12/18/21  Yes [provider]  lactulose (CHRONULAC) 10 GM/15ML solution Take 20 g (30 mL) 3-4 times a day for a goal of at least 2 bowel movements a day. 04/28/21  Yes Eugenie Filler, MD  metoprolol succinate (TOPROL-XL) 25 MG 24 hr tablet Take 25 mg by mouth daily.   Yes [provider]  pantoprazole (PROTONIX) 40 MG tablet Take 40 mg by mouth daily. 10/10/21  Yes [provider]  tamsulosin (FLOMAX) 0.4 MG CAPS capsule Take 1 capsule (0.4 mg total) by mouth daily after supper. 02/19/17  Yes Epifanio Lesches, MD  albuterol (VENTOLIN HFA) 108 (90 Base) MCG/ACT inhaler Inhale 1-2 puffs into the lungs every 4 (four) hours as needed for cough. 01/16/22   [provider]  atorvastatin (LIPITOR) 20 MG tablet Take 20 mg by mouth daily. Patient not taking: Reported on 01/21/2022    [provider]  famotidine (PEPCID) 20 MG tablet Take 1 tablet (20 mg total) by mouth daily. Patient not taking: Reported on 01/21/2022 04/16/21 04/16/22  Eugenie Filler, MD  guaiFENesin (ROBITUSSIN) 100 MG/5ML liquid Take 10 mLs by mouth every 4 (four) hours as needed for cough or to loosen phlegm. 12/11/21   Fritzi Mandes, MD  ondansetron (ZOFRAN ODT) 4 MG disintegrating tablet Take 1 tablet (4 mg total) by mouth every 8 (eight) hours as needed for nausea or vomiting. Patient not taking: Reported on 01/21/2022 06/15/21   Naaman Plummer, MD  Vitamin D, Cholecalciferol, 1000 UNITS TABS Take  1 tablet by mouth daily.  Patient not taking: Reported on 01/21/2022    [provider]     Vital Signs: BP (!) 145/55 (BP Location: Right Arm)    Pulse 69    Temp 98.5 F (36.9 C) (Oral)    Resp 16    Ht 5\' 5"  (1.651 m)    Wt 170 lb 13.7 oz (77.5 kg)    SpO2 99%    BMI 28.43 kg/m   Physical Exam Constitutional:      Appearance: Normal appearance.  HENT:     Head: Normocephalic and atraumatic.  Cardiovascular:     Rate and Rhythm: Normal rate.  Pulmonary:      Effort: Pulmonary effort is normal. No respiratory distress.  Skin:    General: Skin is warm and dry.  Neurological:     General: No focal deficit present.     Mental Status: He is alert.  Right chest tube in place. Tubing not currently attached to pleurevac with prolock cap on chest tube.  Imaging: DG Chest Port 1 View  Result Date: 01/29/2022 CLINICAL DATA:  Pleural effusion EXAM: PORTABLE CHEST 1 VIEW COMPARISON:  01/25/2022 FINDINGS: No significant change in AP portable chest radiograph. Right-sided pigtail chest tube remains positioned about the lateral right lung base. Unchanged, loculated appearing right pleural effusion about about the lateral and inferior right lung. Elevation of the left hemidiaphragm. Cardiomegaly. IMPRESSION: No significant change in AP portable chest radiograph. Right-sided pigtail chest tube remains positioned about the lateral right lung base. Unchanged, loculated appearing right pleural effusion about the lateral and inferior right lung. Electronically Signed   By: Delanna Ahmadi M.D.   On: 01/29/2022 08:44   [place] EKG SITE RITE  Result Date: 01/28/2022 If Site Rite image not attached, placement could not be confirmed due to current cardiac rhythm.   Labs:  CBC: Recent Labs    01/23/22 0434 01/24/22 0627 01/25/22 0439 01/26/22 0525  WBC 11.0* 15.1* 10.3 9.0  HGB 8.5* 8.0* 8.1* 8.3*  HCT 26.0* 24.0* 24.2* 25.4*  PLT 198 176 189 165    COAGS: Recent Labs    10/25/21 2016 01/21/22 1312  INR 1.2 1.2  APTT 43* 49*    BMP: Recent Labs    01/22/22 0424 01/23/22 0434 01/24/22 0627 01/26/22 0525  NA 130* 133* 135 132*  K 4.3 3.7 4.1 3.7  CL 97* 98 99 95*  CO2 23 26 23 24   GLUCOSE 103* 123* 85 98  BUN 74* 39* 46* 36*  CALCIUM 7.8* 7.7* 7.1* 7.0*  CREATININE 7.52* 5.17* 6.63* 6.09*  GFRNONAA 7* 11* 8* 9*    LIVER FUNCTION TESTS: Recent Labs    10/26/21 0436 10/27/21 0421 12/09/21 1507 12/10/21 1525 01/21/22 1025 01/23/22 0434  01/24/22 0627  BILITOT 1.1  --  1.4*  --  1.6*  --  1.4*  AST 41  --  34  --  52*  --  25  ALT 26  --  22  --  20  --  13  ALKPHOS 298*  --  276*  --  443*  --  331*  PROT 6.5  --  7.2  --  6.3*  --  5.5*  ALBUMIN 2.6*   < > 2.5* 2.6* 1.9* 1.8* 1.6*   < > = values in this interval not displayed.    Assessment and Plan:  Loculated effusion - s/p chest tube placement on 01/21/22 by Dr. Annamaria Boots.  Cultures = ESBL and E. Coli.  Chest tube removed without difficulty today. Will check chest Xray.  Risks and benefits of tunneled catheter placement was discussed with the patient;s daughter who speaks English including, but not limited to bleeding, infection, vascular injury, pneumothorax which may require chest tube placement, air embolism or even death  All of the patient's questions were answered, patient is agreeable to proceed. Consent signed and in chart.  Electronically Signed: Murrell Redden, PA-C 01/29/2022, 9:18 AM    I spent a total of 25 Minutes at the the patient's bedside AND on the patient's hospital floor or unit, greater than 50% of which was counseling/coordinating care for chest tube removal and consent for tunneled catheter.

## 2022-01-29 NOTE — Procedures (Addendum)
Interventional Radiology Procedure Note  Procedure: Placement of a right IJ approach single lumen tunneled cuffed central venous catheter.  Tip is positioned at the superior cavoatrial junction and catheter is ready for immediate use.   Complications: None  Recommendations:  - Ok to use - Do not submerge - Routine line care  - ok to restart any AC  Signed,  Dulcy Fanny. Earleen Newport, DO

## 2022-01-29 NOTE — Progress Notes (Signed)
Progress Note   Patient: Martin Gilbert DOB: 05-02-1941 DOA: 01/21/2022     8 DOS: the patient was seen and examined on 01/29/2022       Brief hospital course: Martin Gilbert is an 81 y.o. M with ESRD on HD, HTN, DM, dCHF who presented with abdominal pain for 3 weeks.  In the ER, urinalysis (still makes significant urine) showed UTI.  Imaging showed low-density area near prostate concerning ofr prostatitis vs abscess.  Urology consulted and admitted on antibiotics.  Also noted to have loculated RIGHT pleural effusion.   2/6: Admitted on antibiotics, had diagnostic thoracentesis 2/7: CT surgery recommended pigtail catheter, placed 2nd day 2/8: Developed delirium 2/10: Chest fluid culture growing ESBL E coli 2/11: Delirium completely resolved 2/12: Family have elected not to do tPA in pleural catheter 2/14: Chest tube removed        Assessment and Plan: * Acute prostatitis Urinary retention UTI (urinary tract infection)- (present on admission) Patient presented with suprapubic pain, difficulty urinating.  CAT scan showed a small lucency, possible prostatitis versus prostatic abscess.  Urology were consulted, recommended medical treatment and outpatient follow-up.  Sepsis was ruled out.  Urine culture with ESBL - Continue meropenem, day 9 of 42 with end date 3/23  - Plan for Ertapenem 500 IV daily via IJ PICC line to be placed today - Weekly CBC/d and CMP while on Ertapenem - Consult ID, appreciate cares  - Continue Flomax - Maintain foley at discharge --> Needs Urology follow up with Dr. Bernardo Gilbert     Pleural effusion [name] of this seems to be traumatic hemothorax, now organized.  Chest tube was placed without resolution of the effusion.  Thoracentesis culture grew ESBL, ?contaminant.  Later culture from chest tube placement showed no growth.  Discussed the nonresolving effusion with CT surgery, not surgical candidate.  Shared decision making regarding  using lytics to reduce the size of the effusion was discussed with family, but ultimately it was elected to remove chest tube, do-nothing.  Given initial uncertainty about etiology of effusion and ?tuberculosis, Quantiferon gold was obtained --> this showed evidence of prior exposure.    We are currently ruling out TB with 3 acid-fast smears.  So far we have collected 2 negative AFB sputums, the third is pending.  Low clinical suspicion for TB - Follow last AFB smear - Consult ID, appreciate cares   Gout -Continue allopurinol  Hepatic cirrhosis ([place])- (present on admission) - Continue Lactulose - Continue midodrine - Continue famotidine  Diabetes mellitus, type II ([name]) Glucose controlled adequately off insulin -Continue statin  ESRD (end stage renal disease) ([place])- (present on admission) - Consult Nephrology for maintenance HD, appreciate cares  Anemia of chronic disease- (present on admission) -Continue iron, EPO  Primary hypertension BP normal here on midodrine - Stop home metoprolol and hydralazine -Continue midodrine    Acute metabolic encephalopathy- (present on admission) Earlier in hospital stay, developed severe confusion, disorientation.  Likely due to infection, hypoxia, possibly medication side effects.  Antibiotics were broadened, psychotropics were minimized, and this resolved.  Continues to have some improving waxing and waning delirium, very mild, mostly at night.  Acute respiratory failure with hypoxia ([name]) At baseline the patient does not use oxygen.  Here he had hypoxia and dyspnea and he required up to 5 and 6 L of oxygen at times due to effusion, now weaned to room air.     Chronic diastolic CHF (congestive heart failure) ([place])- (present on admission) Acute CHF ruled out. -  Minimize fluids  Pressure injury of right buttock, stage 2 ([place])- (present on admission) - Mepilex dressing  Elevated troponin Ischemia ruled out.  Due to poor clearance,  non-infarction troponin elevation.             Subjective: History collected through video phonic interpreter Mild soreness where the chest tube was taken out.  He has no headache, chest pain, dyspnea, back pain, abdominal pain, suprapubic pain.  He feels like he has to pee.  He feels like he has to poop.  No specific confusion.  No vomiting or fever.  Physical Exam: Vitals:   01/29/22 1230 01/29/22 1245 01/29/22 1300 01/29/22 1315  BP: (!) 155/54 (!) 159/52 (!) 145/57 (!) 149/51  Pulse: 84 66 64 64  Resp: 17 15    Temp:      TempSrc:      SpO2: 98% 96% 96% 100%  Weight:      Height:         Frail elderly male, lying in bed, no acute distress, makes eye contact, tries to answer questions appropriately. Heart rate regular, soft systolic murmur, no lower extremity edema Normal respiratory rate and rhythm, lungs diminished bilaterally, probably more on the right, no rales or wheezes Awake, tries respond to questions, and generalized weakness in all 4 extremities.      Data Reviewed: Discussed with infectious disease Laboratory studies notable for AFB smear from 2/12 negative Chest x-ray today shows no pneumothorax  Family Communication: Daughter at the bedside  Disposition: Status is: Inpatient  Remains inpatient appropriate because:  The patient is medically stabilized.  He has had his chest tube removed and there is no pneumothorax.  Once he has his PICC line placed for long-term antibiotics, and his third AFB sputum is negative to rule out TB, he will be medically stable for discharge, likely tomorrow      Planned Discharge Destination: SNF       Author: Edwin Dada, MD 01/29/2022 1:29 PM  For on call review [email or web address].

## 2022-01-29 NOTE — Progress Notes (Signed)
[facility]  ROUNDING NOTE   Subjective:   Martin Gilbert was admitted to [facility] on 01/21/2022 for Urinary retention [R33.9] Back pain [M54.9] UTI (urinary tract infection) [N39.0] Pleural effusion [J90] Pleural effusion on right [J90] Acute cystitis with hematuria [N30.01] Chest pain, unspecified type [R07.9] Sepsis, due to unspecified organism, unspecified whether acute organ dysfunction present [facility]) [A41.9] Acute cough [R05.1]  Last hemodialysis treatment was Saturday, February 4. Left at 72.5kg - below his dry weight.  History is difficult to get even with [place] Interpreter.   Patient seen resting quietly Alert with daughter at bedside Denies abdominal pain  Dialysis scheduled for later today  Objective:  Vital signs in last 24 hours:  Temp:  [97.8 F (36.6 C)-98.5 F (36.9 C)] 98.3 F (36.8 C) (02/14 1115) Pulse Rate:  [61-84] 61 (02/14 1415) Resp:  [14-18] 15 (02/14 1415) BP: (132-159)/(49-61) 155/61 (02/14 1415) SpO2:  [95 %-100 %] 100 % (02/14 1415) Weight:  [75.4 kg-77.5 kg] 75.4 kg (02/14 1115)  Weight change: 4.2 kg Filed Weights   01/28/22 0435 01/29/22 0500 01/29/22 1115  Weight: 73.3 kg 77.5 kg 75.4 kg    Intake/Output: No intake/output data recorded.   Intake/Output this shift:  No intake/output data recorded.  Physical Exam: General: NAD,resting in bed  Head: Normocephalic, atraumatic. Moist oral mucosal membranes  Eyes: Anicteric  Lungs:  Basilar rales, normal effort, cough  Heart: Regular rate and rhythm  Abdomen:  Suprapubic tenderness  Extremities:  1+ peripheral edema.  Neurologic: Nonfocal, moving all four extremities  Skin: No lesions   Access: Left AVF  GU Foley-placed by Urology  Basic Metabolic Panel: Recent Labs  Lab 01/23/22 0434 01/24/22 0627 01/26/22 0525  NA 133* 135 132*  K 3.7 4.1 3.7  CL 98 99 95*  CO2 26 23 24   GLUCOSE 123* 85 98  BUN 39* 46* 36*  CREATININE 5.17* 6.63* 6.09*  CALCIUM  7.7* 7.1* 7.0*  MG  --  1.7  --   PHOS 2.7  --   --      Liver Function Tests: Recent Labs  Lab 01/23/22 0434 01/24/22 0627  AST  --  25  ALT  --  13  ALKPHOS  --  331*  BILITOT  --  1.4*  PROT  --  5.5*  ALBUMIN 1.8* 1.6*    No results for input(s): LIPASE, AMYLASE in the last 168 hours.  Recent Labs  Lab 01/23/22 0956 01/24/22 0627  AMMONIA 37* 41*     CBC: Recent Labs  Lab 01/23/22 0434 01/24/22 0627 01/25/22 0439 01/26/22 0525  WBC 11.0* 15.1* 10.3 9.0  NEUTROABS  --  12.3*  --   --   HGB 8.5* 8.0* 8.1* 8.3*  HCT 26.0* 24.0* 24.2* 25.4*  MCV 94.2 92.7 92.0 93.4  PLT 198 176 189 165     Cardiac Enzymes: No results for input(s): CKTOTAL, CKMB, CKMBINDEX, TROPONINI in the last 168 hours.  BNP: Invalid input(s): POCBNP  CBG: Recent Labs  Lab 01/28/22 0017 01/28/22 0442 01/28/22 0842 01/28/22 1645 01/28/22 2117  GLUCAP 102* 99 93 129* 71     Microbiology: Results for orders placed or performed during the hospital encounter of 01/21/22  Urine Culture     Status: Abnormal   Collection Time: 01/21/22 10:25 AM   Specimen: Urine, Random  Result Value Ref Range Status   Specimen Description   Final    URINE, RANDOM Performed at [facility], [place], [place]  [address]    Special Requests   Final    NONE Performed at [facility], [address] [place], [place] [address]    Culture (A)  Final    >=100,000 COLONIES/mL ESCHERICHIA COLI CORRECTED ON 02/10 AT 1430: PREVIOUSLY REPORTED AS MULTIPLE SPECIES PRESENT, SUGGEST RECOLLECTION Confirmed Extended Spectrum Beta-Lactamase Producer (ESBL).  In bloodstream infections from ESBL organisms, carbapenems are preferred over piperacillin/tazobactam. They are shown to have a lower risk of mortality.    Report Status 01/28/2022 FINAL  Final   Organism ID, Bacteria ESCHERICHIA COLI (A)  Final      Susceptibility   Escherichia coli - MIC*    AMPICILLIN >=32  RESISTANT Resistant     CEFAZOLIN >=64 RESISTANT Resistant     CEFEPIME 16 RESISTANT Resistant     CEFTRIAXONE >=64 RESISTANT Resistant     CIPROFLOXACIN >=4 RESISTANT Resistant     GENTAMICIN >=16 RESISTANT Resistant     IMIPENEM <=0.25 SENSITIVE Sensitive     NITROFURANTOIN <=16 SENSITIVE Sensitive     TRIMETH/SULFA >=320 RESISTANT Resistant     AMPICILLIN/SULBACTAM >=32 RESISTANT Resistant     PIP/TAZO 8 SENSITIVE Sensitive     * >=100,000 COLONIES/mL ESCHERICHIA COLI CORRECTED ON 02/10 AT 1430: PREVIOUSLY REPORTED AS MULTIPLE SPECIES PRESENT, SUGGEST RECOLLECTION  Blood Culture (routine x 2)     Status: None   Collection Time: 01/21/22  1:12 PM   Specimen: BLOOD  Result Value Ref Range Status   Specimen Description BLOOD BLOOD RIGHT ARM  Final   Special Requests   Final    BOTTLES DRAWN AEROBIC AND ANAEROBIC Blood Culture adequate volume   Culture   Final    NO GROWTH 5 DAYS Performed at [facility], [address] [place], [place] [address]    Report Status 01/26/2022 FINAL  Final  Blood Culture (routine x 2)     Status: None   Collection Time: 01/21/22  1:12 PM   Specimen: BLOOD  Result Value Ref Range Status   Specimen Description BLOOD BLOOD RIGHT FOREARM  Final   Special Requests   Final    BOTTLES DRAWN AEROBIC AND ANAEROBIC Blood Culture adequate volume   Culture   Final    NO GROWTH 5 DAYS Performed at [facility], [address] [place], [place] [address]    Report Status 01/26/2022 FINAL  Final  Acid Fast Smear (AFB)     Status: None   Collection Time: 01/21/22  4:02 PM   Specimen: Chest; Body Fluid  Result Value Ref Range Status   AFB Specimen Processing Concentration  Final   Acid Fast Smear Negative  Final    Comment: (NOTE) Performed At: [facility] [place], [place] [id number] Rush Farmer MD OE:[id number]    Source (AFB) NONE  Final    Comment: Performed at [facility], [address]  [place], [place] [address]  Body fluid culture w Gram Stain     Status: None   Collection Time: 01/21/22  4:02 PM   Specimen: Chest; Body Fluid  Result Value Ref Range Status   Specimen Description   Final    CHEST FLUID Performed at [facility], [place] [address] [place], [place] [address]    Special Requests   Final    NONE Performed at [facility], [address] [place], [place] [address]    Gram Stain   Final    NO WBC SEEN NO ORGANISMS SEEN Performed  at [facility], [place] [address] [place], [place] [address]    Culture   Final    FEW ESCHERICHIA COLI Confirmed Extended Spectrum Beta-Lactamase Producer (ESBL).  In bloodstream infections from ESBL organisms, carbapenems are preferred over piperacillin/tazobactam. They are shown to have a lower risk of mortality.    Report Status 01/25/2022 FINAL  Final   Organism ID, Bacteria ESCHERICHIA COLI  Final      Susceptibility   Escherichia coli - MIC*    AMPICILLIN >=32 RESISTANT Resistant     CEFAZOLIN >=64 RESISTANT Resistant     CEFEPIME 16 RESISTANT Resistant     CEFTAZIDIME RESISTANT Resistant     CEFTRIAXONE >=64 RESISTANT Resistant     CIPROFLOXACIN >=4 RESISTANT Resistant     GENTAMICIN >=16 RESISTANT Resistant     IMIPENEM <=0.25 SENSITIVE Sensitive     TRIMETH/SULFA >=320 RESISTANT Resistant     AMPICILLIN/SULBACTAM >=32 RESISTANT Resistant     PIP/TAZO 8 SENSITIVE Sensitive     * FEW ESCHERICHIA COLI  Aerobic/Anaerobic Culture w Gram Stain (surgical/deep wound)     Status: None   Collection Time: 01/22/22  3:56 PM   Specimen: Pleural Fluid  Result Value Ref Range Status   Specimen Description   Final    PLEURAL RIGHT Performed at [facility], [address] [place], [place] [address]    Special Requests   Final    PLE FLU Performed at [facility], [address] [place], [place] [address]    Gram Stain   Final    RARE WBC PRESENT, PREDOMINANTLY MONONUCLEAR NO  ORGANISMS SEEN    Culture   Final    No growth aerobically or anaerobically. Performed at [facility], [place] [address], [place], [place] [address]    Report Status 01/28/2022 FINAL  Final  Resp Panel by RT-PCR (Flu A&B, Covid) Nasopharyngeal Swab     Status: None   Collection Time: 01/23/22  4:52 PM   Specimen: Nasopharyngeal Swab; Nasopharyngeal(NP) swabs in vial transport medium  Result Value Ref Range Status   SARS Coronavirus 2 by RT PCR NEGATIVE NEGATIVE Final    Comment: (NOTE) SARS-CoV-2 target nucleic acids are NOT DETECTED.  The SARS-CoV-2 RNA is generally detectable in upper respiratory specimens during the acute phase of infection. The lowest concentration of SARS-CoV-2 viral copies this assay can detect is 138 copies/mL. A negative result does not preclude SARS-Cov-2 infection and should not be used as the sole basis for treatment or other patient management decisions. A negative result may occur with  improper specimen collection/handling, submission of specimen other than nasopharyngeal swab, presence of viral mutation(s) within the areas targeted by this assay, and inadequate number of viral copies(<138 copies/mL). A negative result must be combined with clinical observations, patient history, and epidemiological information. The expected result is Negative.  Fact Sheet for Patients:  EntrepreneurPulse.com.au  Fact Sheet for Healthcare Providers:  IncredibleEmployment.be  This test is no t yet approved or cleared by the [place] FDA and  has been authorized for detection and/or diagnosis of SARS-CoV-2 by FDA under an Emergency Use Authorization (EUA). This EUA will remain  in effect (meaning this test can be used) for the duration of the COVID-19 declaration under Section 564(b)(1) of the Act, 21 U.S.C.section 360bbb-3(b)(1), unless the authorization is terminated  or revoked sooner.       Influenza A by PCR  NEGATIVE NEGATIVE Final   Influenza B by PCR NEGATIVE NEGATIVE Final  Comment: (NOTE) The Xpert Xpress SARS-CoV-2/FLU/RSV plus assay is intended as an aid in the diagnosis of influenza from Nasopharyngeal swab specimens and should not be used as a sole basis for treatment. Nasal washings and aspirates are unacceptable for Xpert Xpress SARS-CoV-2/FLU/RSV testing.  Fact Sheet for Patients: EntrepreneurPulse.com.au  Fact Sheet for Healthcare Providers: IncredibleEmployment.be  This test is not yet approved or cleared by the [place] FDA and has been authorized for detection and/or diagnosis of SARS-CoV-2 by FDA under an Emergency Use Authorization (EUA). This EUA will remain in effect (meaning this test can be used) for the duration of the COVID-19 declaration under Section 564(b)(1) of the Act, 21 U.S.C. section 360bbb-3(b)(1), unless the authorization is terminated or revoked.  Performed at [facility], [address], [place], [place] [address]   Aerobic/Anaerobic Culture w Gram Stain (surgical/deep wound)     Status: None (Preliminary result)   Collection Time: 01/25/22 12:00 PM   Specimen: Pleura  Result Value Ref Range Status   Specimen Description   Final    PLEURAL Performed at [facility], [address] [place], [place] [address]    Special Requests   Final    RIGHT CHEST TUBE Performed at [facility], [place], [place] [address]    Gram Stain NO WBC SEEN NO ORGANISMS SEEN   Final   Culture   Final    NO GROWTH 4 DAYS NO ANAEROBES ISOLATED; CULTURE IN PROGRESS FOR 5 DAYS Performed at [facility], [place] [address], [place], [place] [address]    Report Status PENDING  Incomplete  Acid Fast Smear (AFB)     Status: None   Collection Time: 01/25/22 12:05 PM   Specimen: Sputum  Result Value Ref Range Status   AFB Specimen Processing Concentration  Final   Acid Fast Smear  Negative  Final    Comment: (NOTE) Performed At: [facility] [place], [place] [id number] Rush Farmer MD KP:[id number]    Source (AFB) PLEURAL  Final    Comment: Performed at [facility], [address] [place], [place] [address]  Acid Fast Smear (AFB)     Status: None   Collection Time: 01/26/22  4:00 PM   Specimen: Sputum  Result Value Ref Range Status   AFB Specimen Processing Concentration  Final   Acid Fast Smear Negative  Final    Comment: (NOTE) Performed At: [facility] [place], [place] [id number] Rush Farmer MD FF:[id number]    Source (AFB) EXPECTORATED SPUTUM  Final    Comment: Performed at [facility], [address] [place], [place] [address]    Coagulation Studies: No results for input(s): LABPROT, INR in the last 72 hours.   Urinalysis: No results for input(s): COLORURINE, LABSPEC, PHURINE, GLUCOSEU, HGBUR, BILIRUBINUR, KETONESUR, PROTEINUR, UROBILINOGEN, NITRITE, LEUKOCYTESUR in the last 72 hours.  Invalid input(s): APPERANCEUR     Imaging: DG Chest 1 View  Result Date: 01/29/2022 CLINICAL DATA:  Right chest tube removal. EXAM: CHEST  1 VIEW COMPARISON:  Chest x-ray from same day at 0736 hours. FINDINGS: Stable cardiomediastinal silhouette. Interval removal of the right-sided chest tube. No pneumothorax. Unchanged loculated right pleural effusion. Slightly increased left basilar atelectasis. IMPRESSION: 1. Interval removal of the right-sided chest tube. No pneumothorax. 2. Unchanged loculated right pleural effusion. Electronically Signed   By: Titus Dubin M.D.   On: 01/29/2022 10:24   DG Chest Port 1 View  Result Date: 01/29/2022  CLINICAL DATA:  Pleural effusion EXAM: PORTABLE CHEST 1 VIEW COMPARISON:  01/25/2022 FINDINGS: No significant change in AP portable chest radiograph. Right-sided pigtail chest tube remains positioned about the lateral right lung base. Unchanged, loculated  appearing right pleural effusion about about the lateral and inferior right lung. Elevation of the left hemidiaphragm. Cardiomegaly. IMPRESSION: No significant change in AP portable chest radiograph. Right-sided pigtail chest tube remains positioned about the lateral right lung base. Unchanged, loculated appearing right pleural effusion about the lateral and inferior right lung. Electronically Signed   By: Delanna Ahmadi M.D.   On: 01/29/2022 08:44   [place] EKG SITE RITE  Result Date: 01/28/2022 If Site Rite image not attached, placement could not be confirmed due to current cardiac rhythm.    Medications:    sodium chloride     meropenem (MERREM) IV 500 mg (01/28/22 1705)    sodium chloride   Intravenous Once   allopurinol  50 mg Oral Daily   atorvastatin  20 mg Oral Daily   Chlorhexidine Gluconate Cloth  6 each Topical Q0600   cholecalciferol  1,000 Units Oral Daily   epoetin (EPOGEN/PROCRIT) injection  10,000 Units Intravenous Q T,Th,Sa-HD   famotidine  20 mg Oral Daily   ferrous sulfate  325 mg Oral BID WC   heparin injection (subcutaneous)  5,000 Units Subcutaneous Q8H   lactulose  20 g Oral TID   midodrine  5 mg Oral TID WC   senna-docusate  2 tablet Oral QHS   sodium chloride flush  3 mL Intravenous Q12H   tamsulosin  0.4 mg Oral QPC supper     Assessment/ Plan:  Mr. Daylin Gruszka is a 81 y.o. Hispanic male who speaks [place] and limited [place] and [place].  Patient with end stage renal disease on hemodialysis, hypertension, BPH, diabetes mellitus type II, hyperlipidemia who presents to [facility] on 01/21/2022 for Urinary retention [R33.9] Back pain [M54.9] UTI (urinary tract infection) [N39.0] Pleural effusion [J90] Pleural effusion on right [J90] Acute cystitis with hematuria [N30.01] Chest pain, unspecified type [R07.9] Sepsis, due to unspecified organism, unspecified whether acute organ dysfunction present ([place]) [A41.9] Acute cough [R05.1]   [facility] TTS [facility]  Left AVF 73kg.    End stage renal disease: Continue TTS schedule. Dialysis scheduled for later today, at bedside due to airborne precautions. Dialysis coordinator aware of patient and monitoring discharge planning to assist with any outpatient needs. Awaiting final TB sputum to determine if outpatient placement with active infection.    Anemia of chronic kidney disease: Will obtain labs with dialysis. - EPO with dialysis treatments   Hypertension: .Home regimen includes hydralazine, metoprolol and tamsulosin.  BP 147/55 during dialysis   Secondary Hyperparathyroidism: Continue cholecalciferol daily.   -Will continue to monitor  5.  Right pleural effusion likely due to rib fracture.  Posttraumatic hemorrhagic pleural effusion.  Right chest tube placed on 01/22/2022.  Testing sent on drainage.  Found to have e coli. Pulmonology and ID  following.  -Receiving Meropenem.  - Chest tube discontinued yesterday. Vascular to place PICC for antibiotic therapy after discharge.     LOS: 8   2/14/20232:18 PM

## 2022-01-29 NOTE — TOC Progression Note (Addendum)
Transition of Care [facility]) - Progression Note    Patient Details  Name: Marquiz Sotelo MRN: [id number] Date of Birth: 03-05-1941  Transition of Care [facility]) CM/SW Contact  Candie Chroman, LCSW Phone Number: 01/29/2022, 8:39 AM  Clinical Narrative:  No bed offers this morning.   1:11 pm: Per daughter, wife is blind and cannot provide much physical assistance. Family cannot be with him 24/7 so they prefer SNF. [facility] is unable to offer a bed. Asked [facility] admissions coordinator to review. If they cannot offer will need to complete SNF search outside of [facility] and switch his HD center.  Expected Discharge Plan: [place] (vs home health) Barriers to Discharge: Continued Medical Work up  Expected Discharge Plan and Services Expected Discharge Plan: [place] (vs home health)     Post Acute Care Choice:  (TBD) Living arrangements for the past 2 months: Single Family Home                                       Social Determinants of Health (SDOH) Interventions    Readmission Risk Interventions Readmission Risk Prevention Plan 04/16/2021  Transportation Screening Complete  Home Care Screening Complete  Medication Review (RN CM) Complete  Some recent data might be hidden

## 2022-01-29 NOTE — Progress Notes (Addendum)
Hemodialysis notes  HD treatment completed, tolerated well, No complications. Total treatment time 2.5 hours. Total UF net removed 1L.

## 2022-01-29 NOTE — Progress Notes (Signed)
OT Cancellation Note  Patient Details Name: Martin Gilbert MRN: [id number] DOB: 1941-01-09   Cancelled Treatment:    Reason Eval/Treat Not Completed: Patient at procedure or test/ unavailable. Pt currently receiving dialysis in room. OT to re-attempt when pt is next available.  Darleen Crocker, [place], OTR/L , CBIS ascom [phone]  01/29/22, 12:50 PM

## 2022-01-29 NOTE — Progress Notes (Signed)
PT Cancellation Note  Patient Details Name: Sacha Radloff MRN: [id number] DOB: 02-Aug-1941   Cancelled Treatment:     In to see pt earlier this am who was busy with nursing, therapist returned and pt receiving in room dialysis. Continue PT next available date/time.   Josie Dixon 01/29/2022, 12:02 PM

## 2022-01-29 NOTE — Assessment & Plan Note (Addendum)
Patient had low blood pressure so patient was on midodrine 2/15 blood pressure seems to be high, discontinued midodrine - Resumed home metoprolol and hydralazine with holding parameters -We will continue monitor BP and titrate medications accordingly

## 2022-01-30 ENCOUNTER — Inpatient Hospital Stay: Payer: Medicare Other

## 2022-01-30 DIAGNOSIS — N410 Acute prostatitis: Secondary | ICD-10-CM | POA: Diagnosis not present

## 2022-01-30 LAB — AEROBIC/ANAEROBIC CULTURE W GRAM STAIN (SURGICAL/DEEP WOUND)
Culture: NO GROWTH
Gram Stain: NONE SEEN

## 2022-01-30 LAB — RENAL FUNCTION PANEL
Albumin: 1.6 g/dL — ABNORMAL LOW (ref 3.5–5.0)
Anion gap: 10 (ref 5–15)
BUN: 22 mg/dL (ref 8–23)
CO2: 28 mmol/L (ref 22–32)
Calcium: 6.9 mg/dL — ABNORMAL LOW (ref 8.9–10.3)
Chloride: 98 mmol/L (ref 98–111)
Creatinine, Ser: 5.5 mg/dL — ABNORMAL HIGH (ref 0.61–1.24)
GFR, Estimated: 10 mL/min — ABNORMAL LOW (ref >60)
Glucose, Bld: 102 mg/dL — ABNORMAL HIGH (ref 70–99)
Phosphorus: 3.1 mg/dL (ref 2.5–4.6)
Potassium: 3.1 mmol/L — ABNORMAL LOW (ref 3.5–5.1)
Sodium: 136 mmol/L (ref 135–145)

## 2022-01-30 LAB — CBC
HCT: 26.8 % — ABNORMAL LOW (ref 39.0–52.0)
Hemoglobin: 8.6 g/dL — ABNORMAL LOW (ref 13.0–17.0)
MCH: 29.8 pg (ref 26.0–34.0)
MCHC: 32.1 g/dL (ref 30.0–36.0)
MCV: 92.7 fL (ref 80.0–100.0)
Platelets: 184 10*3/uL (ref 150–400)
RBC: 2.89 MIL/uL — ABNORMAL LOW (ref 4.22–5.81)
RDW: 16.8 % — ABNORMAL HIGH (ref 11.5–15.5)
WBC: 5.1 10*3/uL (ref 4.0–10.5)
nRBC: 0 % (ref 0.0–0.2)

## 2022-01-30 MED ORDER — HYDRALAZINE HCL 50 MG PO TABS
50.0000 mg | ORAL_TABLET | Freq: Three times a day (TID) | ORAL | Status: DC
  Administered 2022-01-30 – 2022-02-03 (×5): 50 mg via ORAL
  Filled 2022-01-30 (×12): qty 1

## 2022-01-30 MED ORDER — POTASSIUM CHLORIDE CRYS ER 20 MEQ PO TBCR
40.0000 meq | EXTENDED_RELEASE_TABLET | Freq: Once | ORAL | Status: AC
  Administered 2022-01-30: 40 meq via ORAL
  Filled 2022-01-30: qty 2

## 2022-01-30 MED ORDER — METOPROLOL SUCCINATE ER 25 MG PO TB24
25.0000 mg | ORAL_TABLET | Freq: Every day | ORAL | Status: DC
  Administered 2022-01-30 – 2022-01-31 (×2): 25 mg via ORAL
  Filled 2022-01-30 (×5): qty 1

## 2022-01-30 NOTE — Progress Notes (Signed)
Progress Note   Patient: Martin Gilbert UVO:[id number] DOB: Mar 12, 1941 DOA: 01/21/2022     9 DOS: the patient was seen and examined on 01/30/2022   Brief hospital course: [name] is an 81 y.o. M with ESRD on HD, HTN, DM, dCHF who presented with abdominal pain for 3 weeks.  In the ER, urinalysis (still makes significant urine) showed UTI.  Imaging showed low-density area near prostate concerning ofr prostatitis vs abscess.  Urology consulted and admitted on antibiotics.  Also noted to have loculated RIGHT pleural effusion.   2/6: Admitted on antibiotics, had diagnostic thoracentesis 2/7: CT surgery recommended pigtail catheter, placed 2nd day 2/8: Developed delirium 2/10: Chest fluid culture growing ESBL E coli 2/11: Delirium completely resolved 2/12: Family have elected not to do tPA in pleural catheter 2/14: Chest tube removed  Assessment and Plan: * Acute prostatitis Patient presented with suprapubic pain, difficulty urinating.  CAT scan showed a small lucency, possible prostatitis versus prostatic abscess.  Urology were consulted, recommended medical treatment and outpatient follow-up.  Sepsis was ruled out.  Urine culture with ESBL - Continue meropenem, day 10 of 42 with end date 3/23  - Plan for Ertapenem 500 IV daily via IJ PICC line to be placed today - Weekly CBC/d and CMP while on Ertapenem - Consult ID, appreciate cares  - Continue Flomax - Maintain foley at discharge --> Needs Urology follow up with Dr. [name]      Gout -Continue allopurinol  Pressure injury of right buttock, stage 2 ([place])- (present on admission) - Mepilex dressing  Acute respiratory failure with hypoxia ([place]) At baseline the patient does not use oxygen.  Here he had hypoxia and dyspnea and he required up to 5 and 6 L of oxygen at times due to effusion, now weaned to room air.     Elevated troponin Ischemia ruled out.  Due to poor clearance, non-infarction troponin  elevation.  Urinary retention - See above  Pleural effusion [name] of this seems to be traumatic hemothorax, now organized.  Chest tube was placed without resolution of the effusion.  Thoracentesis culture grew ESBL, ?contaminant.  Later culture from chest tube placement showed no growth.  Discussed the nonresolving effusion with CT surgery, not surgical candidate.  Shared decision making regarding using lytics to reduce the size of the effusion was discussed with family, but ultimately it was elected to remove chest tube, do-nothing.  Given initial uncertainty about etiology of effusion and ?tuberculosis, Quantiferon gold was obtained --> this showed evidence of prior exposure.    We are currently ruling out TB with 3 acid-fast smears.  So far we have collected 2 negative AFB sputums, the third is pending.  Low clinical suspicion for TB - Follow last AFB smear - Consult ID, appreciate cares  Chronic diastolic CHF (congestive heart failure) ([place])- (present on admission) Acute CHF ruled out. - Minimize fluids   Hepatic cirrhosis ([place])- (present on admission) - Continue Lactulose - Continue midodrine - Continue famotidine  Diabetes mellitus, type II ([name]) Glucose controlled adequately off insulin -Continue statin  Acute metabolic encephalopathy- (present on admission) Earlier in hospital stay, developed severe confusion, disorientation.  Likely due to infection, hypoxia, possibly medication side effects.  Antibiotics were broadened, psychotropics were minimized, and this resolved.  Continues to have some improving waxing and waning delirium, very mild, mostly at night.  ESRD (end stage renal disease) ([place])- (present on admission) - Consult Nephrology for maintenance HD, appreciate cares Right IJ catheter was pulled, chest x-ray ordered to  confirm position Nephrology is aware 2/15 Hypokalemia potassium 3.1, potassium 40 mEq x 1 dose given  Anemia of chronic disease- (present on  admission) -Continue iron, EPO  UTI (urinary tract infection)- (present on admission) - See above  Primary hypertension Patient had low blood pressure so patient was on midodrine 2/15 blood pressure seems to be high, discontinued midodrine - Resumed home metoprolol and hydralazine with holding parameters -We will continue monitor BP and titrate medications accordingly  Sepsis ([name])-resolved as of 01/24/2022, (present on admission) Due to complicated UTI with acute urinary retention, possibly prostatitis.      Body mass index is 27.81 kg/m.   Active Pressure Injury/Wound(s)     Pressure Ulcer  Duration          Pressure Injury 01/21/22 Buttocks Right Stage 2 -  Partial thickness loss of dermis presenting as a shallow open injury with a red, pink wound bed without slough. 8 days            Subjective: No significant overnight events, patient had BM and wanted to be cleaned, RN was informed. Patient speaks Spanish, denied any active issues at this time, communicated with translation by family member.  Physical Exam: Vitals:   01/30/22 0500 01/30/22 0621 01/30/22 0830 01/30/22 1454  BP:  (!) 152/53 (!) 142/55 (!) 141/55  Pulse:  76 74 74  Resp:  18 17 15   Temp:  98.1 F (36.7 C) 98.9 F (37.2 C) 98.7 F (37.1 C)  TempSrc:   Oral   SpO2:  95% 95% 96%  Weight: 75.8 kg     Height:         Physical Exam: General:  alert, NAD, lying comfortably in the bed.  Appear in no distress, affect appropriate Eyes: PERRLA ENT: Oral Mucosa Clear, moist  Neck: No JVD,  Cardiovascular: S1 and S2 Present, no Murmur,  Respiratory: good respiratory effort, Bilateral Air entry equal and Decreased, no Crackles, no wheezes Abdomen: Bowel Sound present, Soft and no tenderness,  Skin: no rashes Extremities: no Pedal edema, no calf tenderness Neurologic: without any new focal findings Gait not checked due to patient safety concerns   Data Reviewed:  Labs reviewed and  analyzed  Potassium 3.1, repleted Creatinine 5.5  Family Communication: Discussed with family at bedside 2/15  Disposition: Status is: Inpatient  Remains inpatient appropriate because: Sputum AFB negative x3, Airborne isolation has been discontinued.  Patient is medically stable, awaiting for placement    Planned Discharge Destination: Skilled nursing facility     Time spent: 35 minutes  Author: Val Riles, MD 01/30/2022 5:30 PM  For on call review [email or web address].

## 2022-01-30 NOTE — Progress Notes (Signed)
[facility]  ROUNDING NOTE   Subjective:   [name] was admitted to [facility] on 01/21/2022 for Urinary retention [R33.9] Back pain [M54.9] UTI (urinary tract infection) [N39.0] Pleural effusion [J90] Pleural effusion on right [J90] Acute cystitis with hematuria [N30.01] Chest pain, unspecified type [R07.9] Sepsis, due to unspecified organism, unspecified whether acute organ dysfunction present [facility]) [A41.9] Acute cough [R05.1]  Last hemodialysis treatment was Saturday, February 4. Left at 72.5kg - below his dry weight.  History is difficult to get even with [place] Interpreter.   Patient seen resting in bed Family at bedside Denies abdominal pain Denies shortness of breath  Dialysis yesterday, tolerated well  Objective:  Vital signs in last 24 hours:  Temp:  [98.1 F (36.7 C)-98.9 F (37.2 C)] 98.9 F (37.2 C) (02/15 0830) Pulse Rate:  [61-85] 74 (02/15 0830) Resp:  [14-27] 17 (02/15 0830) BP: (139-164)/(52-67) 142/55 (02/15 0830) SpO2:  [95 %-100 %] 95 % (02/15 0830) Weight:  [73.8 kg-75.8 kg] 75.8 kg (02/15 0500)  Weight change: -2.1 kg Filed Weights   01/29/22 1115 01/29/22 1437 01/30/22 0500  Weight: 75.4 kg 73.8 kg 75.8 kg    Intake/Output: I/O last 3 completed shifts: In: 100 [P.O.:100] Out: 1040 [Urine:40; Other:1000]   Intake/Output this shift:  No intake/output data recorded.  Physical Exam: General: NAD,resting in bed  Head: Normocephalic, atraumatic. Moist oral mucosal membranes  Eyes: Anicteric  Lungs:  Basilar rales, normal effort, cough  Heart: Regular rate and rhythm  Abdomen:  Suprapubic tenderness  Extremities:  1+ peripheral edema.  Neurologic: Nonfocal, moving all four extremities  Skin: No lesions   Access: Left AVF  GU Foley-placed by Urology  Basic Metabolic Panel: Recent Labs  Lab 01/24/22 0627 01/26/22 0525 01/30/22 0519  NA 135 132* 136  K 4.1 3.7 3.1*  CL 99 95* 98  CO2 23 24 28   GLUCOSE 85  98 102*  BUN 46* 36* 22  CREATININE 6.63* 6.09* 5.50*  CALCIUM 7.1* 7.0* 6.9*  MG 1.7  --   --   PHOS  --   --  3.1     Liver Function Tests: Recent Labs  Lab 01/24/22 0627 01/30/22 0519  AST 25  --   ALT 13  --   ALKPHOS 331*  --   BILITOT 1.4*  --   PROT 5.5*  --   ALBUMIN 1.6* 1.6*    No results for input(s): LIPASE, AMYLASE in the last 168 hours.  Recent Labs  Lab 01/24/22 0627  AMMONIA 41*     CBC: Recent Labs  Lab 01/24/22 0627 01/25/22 0439 01/26/22 0525 01/30/22 0519  WBC 15.1* 10.3 9.0 5.1  NEUTROABS 12.3*  --   --   --   HGB 8.0* 8.1* 8.3* 8.6*  HCT 24.0* 24.2* 25.4* 26.8*  MCV 92.7 92.0 93.4 92.7  PLT 176 189 165 184     Cardiac Enzymes: No results for input(s): CKTOTAL, CKMB, CKMBINDEX, TROPONINI in the last 168 hours.  BNP: Invalid input(s): POCBNP  CBG: Recent Labs  Lab 01/28/22 0017 01/28/22 0442 01/28/22 0842 01/28/22 1645 01/28/22 2117  GLUCAP 102* 99 93 129* 11     Microbiology: Results for orders placed or performed during the hospital encounter of 01/21/22  Urine Culture     Status: Abnormal   Collection Time: 01/21/22 10:25 AM   Specimen: Urine, Random  Result Value Ref Range Status   Specimen Description   Final    URINE, RANDOM Performed at [facility]  Lab, [address], [place], [place] [address]    Special Requests   Final    NONE Performed at [facility], [address] [place], [place] [address]    Culture (A)  Final    >=100,000 COLONIES/mL ESCHERICHIA COLI CORRECTED ON 02/10 AT 1430: PREVIOUSLY REPORTED AS MULTIPLE SPECIES PRESENT, SUGGEST RECOLLECTION Confirmed Extended Spectrum Beta-Lactamase Producer (ESBL).  In bloodstream infections from ESBL organisms, carbapenems are preferred over piperacillin/tazobactam. They are shown to have a lower risk of mortality.    Report Status 01/28/2022 FINAL  Final   Organism ID, Bacteria ESCHERICHIA COLI (A)  Final      Susceptibility    Escherichia coli - MIC*    AMPICILLIN >=32 RESISTANT Resistant     CEFAZOLIN >=64 RESISTANT Resistant     CEFEPIME 16 RESISTANT Resistant     CEFTRIAXONE >=64 RESISTANT Resistant     CIPROFLOXACIN >=4 RESISTANT Resistant     GENTAMICIN >=16 RESISTANT Resistant     IMIPENEM <=0.25 SENSITIVE Sensitive     NITROFURANTOIN <=16 SENSITIVE Sensitive     TRIMETH/SULFA >=320 RESISTANT Resistant     AMPICILLIN/SULBACTAM >=32 RESISTANT Resistant     PIP/TAZO 8 SENSITIVE Sensitive     * >=100,000 COLONIES/mL ESCHERICHIA COLI CORRECTED ON 02/10 AT 1430: PREVIOUSLY REPORTED AS MULTIPLE SPECIES PRESENT, SUGGEST RECOLLECTION  Blood Culture (routine x 2)     Status: None   Collection Time: 01/21/22  1:12 PM   Specimen: BLOOD  Result Value Ref Range Status   Specimen Description BLOOD BLOOD RIGHT ARM  Final   Special Requests   Final    BOTTLES DRAWN AEROBIC AND ANAEROBIC Blood Culture adequate volume   Culture   Final    NO GROWTH 5 DAYS Performed at [facility], [address] [place], [place] [address]    Report Status 01/26/2022 FINAL  Final  Blood Culture (routine x 2)     Status: None   Collection Time: 01/21/22  1:12 PM   Specimen: BLOOD  Result Value Ref Range Status   Specimen Description BLOOD BLOOD RIGHT FOREARM  Final   Special Requests   Final    BOTTLES DRAWN AEROBIC AND ANAEROBIC Blood Culture adequate volume   Culture   Final    NO GROWTH 5 DAYS Performed at [facility], [address] [place], [place] [address]    Report Status 01/26/2022 FINAL  Final  Acid Fast Smear (AFB)     Status: None   Collection Time: 01/21/22  4:02 PM   Specimen: Chest; Body Fluid  Result Value Ref Range Status   AFB Specimen Processing Concentration  Final   Acid Fast Smear Negative  Final    Comment: (NOTE) Performed At: [facility] [place], [place] [id number] Rush Farmer MD [id number]    Source (AFB) NONE  Final    Comment: Performed at  [facility], [address] [place], [place] [address]  Body fluid culture w Gram Stain     Status: None   Collection Time: 01/21/22  4:02 PM   Specimen: Chest; Body Fluid  Result Value Ref Range Status   Specimen Description   Final    CHEST FLUID Performed at [facility], [place] [address], [place], [place] [address]    Special Requests   Final    NONE Performed at [facility], [address] [place], [place] [address]    Gram Stain   Final  NO WBC SEEN NO ORGANISMS SEEN Performed at [facility], [place] [address] [place], [place] [address]    Culture   Final    FEW ESCHERICHIA COLI Confirmed Extended Spectrum Beta-Lactamase Producer (ESBL).  In bloodstream infections from ESBL organisms, carbapenems are preferred over piperacillin/tazobactam. They are shown to have a lower risk of mortality.    Report Status 01/25/2022 FINAL  Final   Organism ID, Bacteria ESCHERICHIA COLI  Final      Susceptibility   Escherichia coli - MIC*    AMPICILLIN >=32 RESISTANT Resistant     CEFAZOLIN >=64 RESISTANT Resistant     CEFEPIME 16 RESISTANT Resistant     CEFTAZIDIME RESISTANT Resistant     CEFTRIAXONE >=64 RESISTANT Resistant     CIPROFLOXACIN >=4 RESISTANT Resistant     GENTAMICIN >=16 RESISTANT Resistant     IMIPENEM <=0.25 SENSITIVE Sensitive     TRIMETH/SULFA >=320 RESISTANT Resistant     AMPICILLIN/SULBACTAM >=32 RESISTANT Resistant     PIP/TAZO 8 SENSITIVE Sensitive     * FEW ESCHERICHIA COLI  Aerobic/Anaerobic Culture w Gram Stain (surgical/deep wound)     Status: None   Collection Time: 01/22/22  3:56 PM   Specimen: Pleural Fluid  Result Value Ref Range Status   Specimen Description   Final    PLEURAL RIGHT Performed at [facility], [address] [place], [place] [address]    Special Requests   Final    PLE FLU Performed at [facility], [address] [place], [place] [address]    Gram Stain   Final    RARE WBC  PRESENT, PREDOMINANTLY MONONUCLEAR NO ORGANISMS SEEN    Culture   Final    No growth aerobically or anaerobically. Performed at [facility], [place] [address], [place], [place] [address]    Report Status 01/28/2022 FINAL  Final  Resp Panel by RT-PCR (Flu A&B, Covid) Nasopharyngeal Swab     Status: None   Collection Time: 01/23/22  4:52 PM   Specimen: Nasopharyngeal Swab; Nasopharyngeal(NP) swabs in vial transport medium  Result Value Ref Range Status   SARS Coronavirus 2 by RT PCR NEGATIVE NEGATIVE Final    Comment: (NOTE) SARS-CoV-2 target nucleic acids are NOT DETECTED.  The SARS-CoV-2 RNA is generally detectable in upper respiratory specimens during the acute phase of infection. The lowest concentration of SARS-CoV-2 viral copies this assay can detect is 138 copies/mL. A negative result does not preclude SARS-Cov-2 infection and should not be used as the sole basis for treatment or other patient management decisions. A negative result may occur with  improper specimen collection/handling, submission of specimen other than nasopharyngeal swab, presence of viral mutation(s) within the areas targeted by this assay, and inadequate number of viral copies(<138 copies/mL). A negative result must be combined with clinical observations, patient history, and epidemiological information. The expected result is Negative.  Fact Sheet for Patients:  EntrepreneurPulse.com.au  Fact Sheet for Healthcare Providers:  IncredibleEmployment.be  This test is no t yet approved or cleared by the [place] FDA and  has been authorized for detection and/or diagnosis of SARS-CoV-2 by FDA under an Emergency Use Authorization (EUA). This EUA will remain  in effect (meaning this test can be used) for the duration of the COVID-19 declaration under Section 564(b)(1) of the Act, 21 U.S.C.section 360bbb-3(b)(1), unless the authorization is terminated  or revoked  sooner.       Influenza A by PCR NEGATIVE NEGATIVE Final   Influenza  B by PCR NEGATIVE NEGATIVE Final    Comment: (NOTE) The Xpert Xpress SARS-CoV-2/FLU/RSV plus assay is intended as an aid in the diagnosis of influenza from Nasopharyngeal swab specimens and should not be used as a sole basis for treatment. Nasal washings and aspirates are unacceptable for Xpert Xpress SARS-CoV-2/FLU/RSV testing.  Fact Sheet for Patients: EntrepreneurPulse.com.au  Fact Sheet for Healthcare Providers: IncredibleEmployment.be  This test is not yet approved or cleared by the [place] FDA and has been authorized for detection and/or diagnosis of SARS-CoV-2 by FDA under an Emergency Use Authorization (EUA). This EUA will remain in effect (meaning this test can be used) for the duration of the COVID-19 declaration under Section 564(b)(1) of the Act, 21 U.S.C. section 360bbb-3(b)(1), unless the authorization is terminated or revoked.  Performed at [facility] [address] [place], [place] [address]   Aerobic/Anaerobic Culture w Gram Stain (surgical/deep wound)     Status: None   Collection Time: 01/25/22 12:00 PM   Specimen: Pleura  Result Value Ref Range Status   Specimen Description   Final    PLEURAL Performed at [facility], [address] [place], [place] [address]    Special Requests   Final    RIGHT CHEST TUBE Performed at [facility], [place], [place] [address]    Gram Stain NO WBC SEEN NO ORGANISMS SEEN   Final   Culture   Final    No growth aerobically or anaerobically. Performed at [facility], [place] [address], [place], [place] [address]    Report Status 01/30/2022 FINAL  Final  Acid Fast Smear (AFB)     Status: None   Collection Time: 01/25/22 12:05 PM   Specimen: Sputum  Result Value Ref Range Status   AFB Specimen Processing Concentration  Final   Acid Fast Smear Negative   Final    Comment: (NOTE) Performed At: [facility] [address] [place], [place] [id number] Rush Farmer MD QA:[id number]    Source (AFB) PLEURAL  Final    Comment: Performed at [facility], [address] [place], [place] [address]  Acid Fast Smear (AFB)     Status: None   Collection Time: 01/26/22  4:00 PM   Specimen: Sputum  Result Value Ref Range Status   AFB Specimen Processing Concentration  Final   Acid Fast Smear Negative  Final    Comment: (NOTE) Performed At: [facility] [place], [place] [id number] Rush Farmer MD [id number]    Source (AFB) EXPECTORATED SPUTUM  Final    Comment: Performed at [facility], [address] [place], [place] [address]  Acid Fast Smear (AFB)     Status: None   Collection Time: 01/27/22  2:51 PM   Specimen: Sputum  Result Value Ref Range Status   AFB Specimen Processing Concentration  Final   Acid Fast Smear Negative  Final    Comment: (NOTE) Performed At: [facility] [place], [place] [id number] Rush Farmer MD [id number]    Source (AFB) SPU  Final    Comment: Performed at [facility], [address] [place], [place] [address]    Coagulation Studies: No results for input(s): LABPROT, INR in the last 72 hours.   Urinalysis: No results for input(s): COLORURINE, LABSPEC, PHURINE, GLUCOSEU, HGBUR, BILIRUBINUR, KETONESUR, PROTEINUR, UROBILINOGEN, NITRITE, LEUKOCYTESUR in the last 72 hours.  Invalid input(s): APPERANCEUR     Imaging: DG Chest 1 View  Result  Date: 01/29/2022 CLINICAL DATA:  Right chest tube removal. EXAM: CHEST  1 VIEW COMPARISON:  Chest x-ray from same day at 0736 hours. FINDINGS: Stable cardiomediastinal silhouette. Interval removal of the right-sided chest tube. No pneumothorax. Unchanged loculated right pleural effusion. Slightly increased left basilar atelectasis. IMPRESSION: 1. Interval removal of the  right-sided chest tube. No pneumothorax. 2. Unchanged loculated right pleural effusion. Electronically Signed   By: Titus Dubin M.D.   On: 01/29/2022 10:24   IR US Guide Vasc Access Right  Result Date: 01/30/2022 INDICATION: 81 year old male referred for tunneled central venous catheter EXAM: IMAGE GUIDED TUNNELED CENTRAL VENOUS CATHETER MEDICATIONS: None ANESTHESIA/SEDATION: Moderate (conscious) sedation was employed during this procedure. A total of Versed 1.0 mg and Fentanyl 50 mcg was administered intravenously by the radiology nurse. Total intra-service moderate Sedation Time: 11 minutes. The patient's level of consciousness and vital signs were monitored continuously by radiology nursing throughout the procedure under my direct supervision. FLUOROSCOPY: Radiation Exposure Index (as provided by the fluoroscopic device): 4.9 mGy Kerma COMPLICATIONS: None PROCEDURE: Informed written consent was obtained from the patient after a thorough discussion of the procedural risks, benefits and alternatives. All questions were addressed. Maximal Sterile Barrier Technique was utilized including caps, mask, sterile gowns, sterile gloves, sterile drape, hand hygiene and skin antiseptic. A timeout was performed prior to the initiation of the procedure. After written informed consent was obtained, patient was placed in the supine position on angiographic table. Patency of the right internal jugular vein was confirmed with ultrasound with image documentation. Patient was prepped and draped in the usual sterile fashion including the right neck and right superior chest. Using ultrasound guidance, the skin and subcutaneous tissues overlying the right internal jugular vein were generously infiltrated with 1% lidocaine without epinephrine. Using ultrasound guidance, the right internal jugular vein was punctured with a micropuncture needle, and an 018 wire was advanced into the right heart confirming venous access. A small  stab incision was made with an 11 blade scalpel. Peel-away sheath was placed over the wire, and then the wire was removed, marking the wire for estimation of internal catheter length. The chest wall was then generously infiltrated with 1% lidocaine for local anesthesia along the tissue tract. Small stab incision was made with 11 blade scalpel, and then the catheter was back tunneled to the puncture site at the right internal jugular vein. Catheter was pulled through the tract, with the catheter amputated at 21 cm. Catheter was advanced through the peel-away sheath, and the peel-away sheath was removed. Final image was stored. The catheter was anchored to the chest wall with 2 retention sutures, and Derma bond was used to seal the right internal jugular vein incision site and at the right chest wall. Patient tolerated the procedure well and remained hemodynamically stable throughout. No complications were encountered and no significant blood loss was encountered. IMPRESSION: Status post image guided placement of right IJ tunneled, cuffed central venous catheter. Signed, Dulcy Fanny. Dellia Nims, RPVI [facility] Radiology Electronically Signed   By: Corrie Mckusick D.O.   On: 01/30/2022 08:05   DG Chest Port 1 View  Result Date: 01/29/2022 CLINICAL DATA:  Pleural effusion EXAM: PORTABLE CHEST 1 VIEW COMPARISON:  01/25/2022 FINDINGS: No significant change in AP portable chest radiograph. Right-sided pigtail chest tube remains positioned about the lateral right lung base. Unchanged, loculated appearing right pleural effusion about about the lateral and inferior right lung. Elevation of the left hemidiaphragm. Cardiomegaly. IMPRESSION: No significant change  in AP portable chest radiograph. Right-sided pigtail chest tube remains positioned about the lateral right lung base. Unchanged, loculated appearing right pleural effusion about the lateral and inferior right lung.  Electronically Signed   By: Delanna Ahmadi M.D.   On: 01/29/2022 08:44   [place] EKG SITE RITE  Result Date: 01/28/2022 If Site Rite image not attached, placement could not be confirmed due to current cardiac rhythm.    Medications:    sodium chloride     ertapenem      sodium chloride   Intravenous Once   allopurinol  50 mg Oral Daily   atorvastatin  20 mg Oral Daily   Chlorhexidine Gluconate Cloth  6 each Topical Q0600   cholecalciferol  1,000 Units Oral Daily   epoetin (EPOGEN/PROCRIT) injection  10,000 Units Intravenous Q T,Th,Sa-HD   famotidine  20 mg Oral Daily   ferrous sulfate  325 mg Oral BID WC   heparin injection (subcutaneous)  5,000 Units Subcutaneous Q8H   hydrALAZINE  50 mg Oral TID   lactulose  20 g Oral TID   metoprolol succinate  25 mg Oral Daily   potassium chloride  40 mEq Oral Once   senna-docusate  2 tablet Oral QHS   sodium chloride flush  3 mL Intravenous Q12H   tamsulosin  0.4 mg Oral QPC supper     Assessment/ Plan:  [name] is a 81 y.o. Hispanic male who speaks [place] and limited [place] and [place].  Patient with end stage renal disease on hemodialysis, hypertension, BPH, diabetes mellitus type II, hyperlipidemia who presents to [facility] on 01/21/2022 for Urinary retention [R33.9] Back pain [M54.9] UTI (urinary tract infection) [N39.0] Pleural effusion [J90] Pleural effusion on right [J90] Acute cystitis with hematuria [N30.01] Chest pain, unspecified type [R07.9] Sepsis, due to unspecified organism, unspecified whether acute organ dysfunction present ([place]) [A41.9] Acute cough [R05.1]   [facility] TTS [facility] Left AVF 73kg.    End stage renal disease: Continue TTS schedule.   Dialysis scheduled for tomorrow  Dialysis coordinator available for outpatient dialysis needs.    Anemia of chronic kidney disease: Hgb 8.6 - Continue EPO with dialysis treatments   Hypertension: .Home regimen includes hydralazine, metoprolol and  tamsulosin.  BP 142/55   Secondary Hyperparathyroidism: Continue cholecalciferol daily.   -Will continue to monitor  5.  Right pleural effusion likely due to rib fracture.  Posttraumatic hemorrhagic pleural effusion.  Right chest tube placed on 01/22/2022 and removed on 01/28/22.  Testing sent on drainage.  Found to have e coli. Pulmonology and ID  following.  -Receiving Meropenem.  - PICC placed yesterday for continued antibiotic therapy after discharge. Ertapenem 500mg  IV daily for 6 weeks    LOS: 9   2/15/20231:58 PM

## 2022-01-30 NOTE — Plan of Care (Signed)
  Problem: Health Behavior/Discharge Planning: Goal: Ability to manage health-related needs will improve Outcome: Progressing   

## 2022-01-30 NOTE — Progress Notes (Signed)
Occupational Therapy Treatment Patient Details Name: Martin Gilbert MRN: [id number] DOB: March 28, 1941 Today's Date: 01/30/2022   History of present illness Pt admitted for sepsis secondary to UTI with retention. History of ESRD on HD, HTN, HLD, Cdiff, and GERD. Pt with chest tube and is currently covid +. Evaluation performed with in person interpreter Kennyth Lose.   OT comments  Pt seen for OT tx this date. When OT presents, pt is seated EOB with PT and noted to be soiled. Pt CTS with PT assisting and essentially is unable to alternate a hand from the walker to perform standing peri care, ultimately requiring MAX A. While seated EOB once PT concluded session, pt engages in seated therex, but is somewhat self limiting-see below. Pt able to get back to bed with SUPV only. Left with all needs met and in reach. CNA and Network engineer notified of call light malfunction. Will continue to follow.   Recommendations for follow up therapy are one component of a multi-disciplinary discharge planning process, led by the attending physician.  Recommendations may be updated based on patient status, additional functional criteria and insurance authorization.    Follow Up Recommendations  Skilled nursing-short term rehab (<3 hours/day)    Assistance Recommended at Discharge    Patient can return home with the following  Two people to help with bathing/dressing/bathroom;A little help with bathing/dressing/bathroom;Assistance with cooking/housework;Help with stairs or ramp for entrance;Assistance with feeding;Assist for transportation;Direct supervision/assist for medications management   Equipment Recommendations  BSC/3in1    Recommendations for Other Services      Precautions / Restrictions Precautions Precautions: Fall Precaution Comments: R side chest tube, foley catheter Restrictions Weight Bearing Restrictions: No       Mobility Bed Mobility Overal bed mobility: Needs Assistance Bed Mobility: Sit  to Supine       Sit to supine: Supervision   General bed mobility comments: ectra time, cues of rails    Transfers                         Balance Overall balance assessment: Needs assistance Sitting-balance support: Feet supported Sitting balance-Leahy Scale: Good                                     ADL either performed or assessed with clinical judgement   ADL Overall ADL's : Needs assistance/impaired                                 Tub/ Shower Transfer: Maximal assistance Tub/Shower Transfer Details (indicate cue type and reason): MAX A for standing peri care d/t poor standing balnace and tolerance with RW        Extremity/Trunk Assessment              Vision       Perception     Praxis      Cognition Arousal/Alertness: Awake/alert Behavior During Therapy: Flat affect Overall Cognitive Status: Within Functional Limits for tasks assessed                                 General Comments: Requires max education/encouragement for participation & OOB mobility        Exercises Other Exercises Other Exercises: OT engages pt in modified tricep push ups while in  EOB sitting x5 (goal was to get to 10, but pt self limiting).    Shoulder Instructions       General Comments      Pertinent Vitals/ Pain       Pain Assessment Pain Assessment: Faces Faces Pain Scale: Hurts even more Pain Location: B feet, foley cath Pain Descriptors / Indicators: Discomfort, Aching Pain Intervention(s): Limited activity within patient's tolerance, Monitored during session  Home Living                                          Prior Functioning/Environment              Frequency  Min 2X/week        Progress Toward Goals  OT Goals(current goals can now be found in the care plan section)  Progress towards OT goals: Progressing toward goals  Acute Rehab OT Goals Patient Stated Goal: have  less pain and go home with family OT Goal Formulation: With patient/family Time For Goal Achievement: 02/08/22 Potential to Achieve Goals: Good  Plan Discharge plan remains appropriate    Co-evaluation    PT/OT/SLP Co-Evaluation/Treatment: Yes Reason for Co-Treatment: To address functional/ADL transfers PT goals addressed during session: Mobility/safety with mobility OT goals addressed during session: ADL's and self-care      AM-PAC OT "6 Clicks" Daily Activity     Outcome Measure   Help from another person eating meals?: A Little Help from another person taking care of personal grooming?: A Little Help from another person toileting, which includes using toliet, bedpan, or urinal?: A Lot Help from another person bathing (including washing, rinsing, drying)?: A Lot Help from another person to put on and taking off regular upper body clothing?: A Little Help from another person to put on and taking off regular lower body clothing?: A Lot 6 Click Score: 15    End of Session Equipment Utilized During Treatment: Gait belt;Rolling walker (2 wheels)  OT Visit Diagnosis: Other abnormalities of gait and mobility (R26.89);Repeated falls (R29.6);Muscle weakness (generalized) (M62.81);Pain Pain - Right/Left: Right Pain - part of body: Ankle and joints of foot   Activity Tolerance Patient tolerated treatment well   Patient Left in chair;with call bell/phone within reach;with chair alarm set;with family/visitor present;with nursing/sitter in room   Nurse Communication Mobility status        Time: 3825-0539 OT Time Calculation (min): 13 min  Charges: OT General Charges $OT Visit: 1 Visit OT Treatments $Self Care/Home Management : 8-22 mins  Gerrianne Scale, [place], OTR/L ascom [phone] 01/30/22, 5:02 PM

## 2022-01-30 NOTE — TOC Progression Note (Signed)
Transition of Care [facility]) - Progression Note    Patient Details  Name: Martin Gilbert MRN: [id number] Date of Birth: 1941-05-07  Transition of Care [facility]) CM/SW Contact  Beverly Sessions, RN Phone Number: 01/30/2022, 1:46 PM  Clinical Narrative:    Discharge antibiotics:  Ertapenem 500mg  IV every 24 hours for a total of 6 weeks  End Date: 03/07/22. TB negative, Chest tube removed   No bed offers in [employer] search extended Daughter in agreement. Hd coordinator notified    Expected Discharge Plan: [place] (vs home health) Barriers to Discharge: Continued Medical Work up  Expected Discharge Plan and Services Expected Discharge Plan: [employer] (vs home health)     Post Acute Care Choice:  (TBD) Living arrangements for the past 2 months: Single Family Home                                       Social Determinants of Health (SDOH) Interventions    Readmission Risk Interventions Readmission Risk Prevention Plan 04/16/2021  Transportation Screening Complete  Home Care Screening Complete  Medication Review (RN CM) Complete  Some recent data might be hidden

## 2022-01-30 NOTE — Progress Notes (Signed)
Physical Therapy Treatment Patient Details Name: Martin Gilbert MRN: [id number] DOB: Oct 02, 1941 Today's Date: 01/30/2022   History of Present Illness 81 y/o male admitted for sepsis secondary to UTI with retention. History of ESRD on HD, HTN, HLD, Cdiff, and GERD. Pt needed chest tube that was removed 2/14.    PT Comments    Initial attempts to work with pt were unsuccessful despite much encouragement and cuing with assist from tele-interpretor, simply stating "no puedo" and refusing to try even light bed exercises.  Wife called the daughter who subsequently acted as interpretor and was able to, relatively easily, convince him to participate.  After PT and other staff clean up a BM and changed bed linens (with pt helping with rolling to side and later standing) we were able to do multiple bouts of sit to stand with brief ambulation effort in room close to the bed.  Despite pt ostensibly not being able to do any ambulation in the months preceeding hospitalization he ultimately did surprisingly well and showed good relative strength and effort with standing, and stepping activities.    Recommendations for follow up therapy are one component of a multi-disciplinary discharge planning process, led by the attending physician.  Recommendations may be updated based on patient status, additional functional criteria and insurance authorization.  Follow Up Recommendations  Skilled nursing-short term rehab (<3 hours/day)     Assistance Recommended at Discharge Frequent or constant Supervision/Assistance  Patient can return home with the following A lot of help with walking and/or transfers;A lot of help with bathing/dressing/bathroom;Assistance with cooking/housework;Assist for transportation;Help with stairs or ramp for entrance   Equipment Recommendations   (per progress)    Recommendations for Other Services       Precautions / Restrictions Precautions Precautions: Fall Precaution  Comments: foley catheter Restrictions Weight Bearing Restrictions: No     Mobility  Bed Mobility Overal bed mobility: Needs Assistance Bed Mobility: Sit to Supine     Supine to sit: Min guard     General bed mobility comments: Pt initially hesitant to do much but ultimately did well once daughter convinced him to participate    Transfers Overall transfer level: Needs assistance Equipment used: Rolling walker (2 wheels) Transfers: Sit to/from Stand Sit to Stand: Mod assist           General transfer comment: Pt was able to stand 3 seperate times.  He was unable to rise w/o at least some assist but needed min and mod assist on subsequent efforts.  Good weight shift forward onto walker and overall did better than expected per family and pt report of his recent mobiltiy    Ambulation/Gait Ambulation/Gait assistance: Min Web designer (Feet): 6 Feet Assistive device: Rolling walker (2 wheels)         General Gait Details: Pt was able to ambulate forward/back multiple times (4-6 ft) as well as perform some EOB side stepping on sucsessive standing efforts.  Pt did not show any buckling or hesitation with WBing though he was reliant on the walker and did appear to fatigue relatively quickly.  Pt smiling and seemingly pleased with himself with the effort.   Stairs             Wheelchair Mobility    Modified Rankin (Stroke Patients Only)       Balance Overall balance assessment: Needs assistance Sitting-balance support: Feet supported Sitting balance-Leahy Scale: Good     Standing balance support: Bilateral upper extremity supported Standing balance-Leahy Scale: [place] Standing  balance comment: reliant on walker but no overt LOBs with multiple and standing efforts                            Cognition Arousal/Alertness: Awake/alert Behavior During Therapy: Flat affect Overall Cognitive Status: Within Functional Limits for tasks assessed                                           Exercises      General Comments General comments (skin integrity, edema, etc.): attempted in-house interp with no available on site, started session with tele interp Martin Gilbert with pt essentially refusing PT/mobility. ultimately pt's wife called daughter who was able to easily motivate pt and was able to maintain appropriate interaction and translating in the most functional way      Pertinent Vitals/Pain Pain Assessment Pain Assessment: Faces Faces Pain Scale: Hurts little more Pain Location: foley    Home Living                          Prior Function            PT Goals (current goals can now be found in the care plan section) Progress towards PT goals: Progressing toward goals    Frequency    Min 2X/week      PT Plan Current plan remains appropriate    Co-evaluation   Reason for Co-Treatment: To address functional/ADL transfers PT goals addressed during session: Mobility/safety with mobility OT goals addressed during session: ADL's and self-care      AM-PAC PT "6 Clicks" Mobility   Outcome Measure  Help needed turning from your back to your side while in a flat bed without using bedrails?: A Little Help needed moving from lying on your back to sitting on the side of a flat bed without using bedrails?: A Little Help needed moving to and from a bed to a chair (including a wheelchair)?: A Little Help needed standing up from a chair using your arms (e.g., wheelchair or bedside chair)?: A Little Help needed to walk in hospital room?: A Lot Help needed climbing 3-5 steps with a railing? : Total 6 Click Score: 15    End of Session Equipment Utilized During Treatment: Gait belt;Oxygen Activity Tolerance: Patient limited by fatigue Patient left: in bed (OT working at [employer])   PT Visit Diagnosis: Muscle weakness (generalized) (M62.81);History of falling (Z91.81);Difficulty in walking, not elsewhere  classified (R26.2);Pain Pain - Right/Left: Right Pain - part of body: Ankle and joints of foot     Time: 2641-5830 PT Time Calculation (min) (ACUTE ONLY): 45 min  Charges:  $Gait Training: 8-22 mins $Therapeutic Activity: 23-37 mins                     Kreg Shropshire, DPT 01/30/2022, 5:53 PM

## 2022-01-31 DIAGNOSIS — N410 Acute prostatitis: Secondary | ICD-10-CM | POA: Diagnosis not present

## 2022-01-31 LAB — CBC
HCT: 28.6 % — ABNORMAL LOW (ref 39.0–52.0)
Hemoglobin: 9.2 g/dL — ABNORMAL LOW (ref 13.0–17.0)
MCH: 29.7 pg (ref 26.0–34.0)
MCHC: 32.2 g/dL (ref 30.0–36.0)
MCV: 92.3 fL (ref 80.0–100.0)
Platelets: 187 10*3/uL (ref 150–400)
RBC: 3.1 MIL/uL — ABNORMAL LOW (ref 4.22–5.81)
RDW: 16.9 % — ABNORMAL HIGH (ref 11.5–15.5)
WBC: 5.1 10*3/uL (ref 4.0–10.5)
nRBC: 0 % (ref 0.0–0.2)

## 2022-01-31 LAB — BASIC METABOLIC PANEL
Anion gap: 9 (ref 5–15)
BUN: 28 mg/dL — ABNORMAL HIGH (ref 8–23)
CO2: 27 mmol/L (ref 22–32)
Calcium: 6.7 mg/dL — ABNORMAL LOW (ref 8.9–10.3)
Chloride: 101 mmol/L (ref 98–111)
Creatinine, Ser: 7.05 mg/dL — ABNORMAL HIGH (ref 0.61–1.24)
GFR, Estimated: 7 mL/min — ABNORMAL LOW (ref >60)
Glucose, Bld: 96 mg/dL (ref 70–99)
Potassium: 3.2 mmol/L — ABNORMAL LOW (ref 3.5–5.1)
Sodium: 137 mmol/L (ref 135–145)

## 2022-01-31 LAB — MAGNESIUM: Magnesium: 1.8 mg/dL (ref 1.7–2.4)

## 2022-01-31 LAB — PHOSPHORUS: Phosphorus: 3.5 mg/dL (ref 2.5–4.6)

## 2022-01-31 LAB — VITAMIN D 25 HYDROXY (VIT D DEFICIENCY, FRACTURES): Vit D, 25-Hydroxy: 34.06 ng/mL (ref 30–100)

## 2022-01-31 MED ORDER — EPOETIN ALFA 10000 UNIT/ML IJ SOLN
INTRAMUSCULAR | Status: AC
  Filled 2022-01-31: qty 1

## 2022-01-31 MED ORDER — CALCIUM CARBONATE 1250 (500 CA) MG PO TABS
1.0000 | ORAL_TABLET | Freq: Two times a day (BID) | ORAL | Status: DC
  Administered 2022-01-31 – 2022-02-02 (×5): 500 mg via ORAL
  Filled 2022-01-31 (×6): qty 1

## 2022-01-31 NOTE — Plan of Care (Signed)
  Problem: Elimination: Goal: Will not experience complications related to bowel motility Outcome: Progressing   Problem: Pain Managment: Goal: General experience of comfort will improve Outcome: Progressing   Problem: Skin Integrity: Goal: Risk for impaired skin integrity will decrease Outcome: Progressing   

## 2022-01-31 NOTE — Progress Notes (Signed)
Progress Note   Patient: Martin Gilbert ZOX:[id number] DOB: October 19, 1941 DOA: 01/21/2022     10 DOS: the patient was seen and examined on 01/31/2022   Brief hospital course: [name] is an 81 y.o. M with ESRD on HD, HTN, DM, dCHF who presented with abdominal pain for 3 weeks.  In the ER, urinalysis (still makes significant urine) showed UTI.  Imaging showed low-density area near prostate concerning ofr prostatitis vs abscess.  Urology consulted and admitted on antibiotics.  Also noted to have loculated RIGHT pleural effusion.   2/6: Admitted on antibiotics, had diagnostic thoracentesis 2/7: CT surgery recommended pigtail catheter, placed 2nd day 2/8: Developed delirium 2/10: Chest fluid culture growing ESBL E coli 2/11: Delirium completely resolved 2/12: Family have elected not to do tPA in pleural catheter 2/14: Chest tube removed  Assessment and Plan: * Acute prostatitis Patient presented with suprapubic pain, difficulty urinating.  CAT scan showed a small lucency, possible prostatitis versus prostatic abscess.  Urology were consulted, recommended medical treatment and outpatient follow-up.  Sepsis was ruled out.  Urine culture with ESBL - Continue meropenem, day 10 of 42 with end date 3/23  - Plan for Ertapenem 500 IV daily via IJ PICC line to be placed today - Weekly CBC/d and CMP while on Ertapenem - Consult ID, appreciate cares  - Continue Flomax - Maintain foley at discharge --> Needs Urology follow up with Dr. [name]      Gout -Continue allopurinol  Pressure injury of right buttock, stage 2 ([place])- (present on admission) - Mepilex dressing  Acute respiratory failure with hypoxia ([place]) At baseline the patient does not use oxygen.  Here he had hypoxia and dyspnea and he required up to 5 and 6 L of oxygen at times due to effusion, now weaned to room air.     Elevated troponin Ischemia ruled out.  Due to poor clearance, non-infarction troponin  elevation.  Urinary retention - See above  Pleural effusion [name] of this seems to be traumatic hemothorax, now organized.  Chest tube was placed without resolution of the effusion.  Thoracentesis culture grew ESBL, ?contaminant.  Later culture from chest tube placement showed no growth.  Discussed the nonresolving effusion with CT surgery, not surgical candidate.  Shared decision making regarding using lytics to reduce the size of the effusion was discussed with family, but ultimately it was elected to remove chest tube, do-nothing.  Given initial uncertainty about etiology of effusion and ?tuberculosis, Quantiferon gold was obtained --> this showed evidence of prior exposure.    We are currently ruling out TB with 3 acid-fast smears.  So far we have collected 2 negative AFB sputums, the third is pending.  Low clinical suspicion for TB - Follow last AFB smear - Consult ID, appreciate cares  Chronic diastolic CHF (congestive heart failure) ([place])- (present on admission) Acute CHF ruled out. - Minimize fluids   Hepatic cirrhosis ([place])- (present on admission) - Continue Lactulose - Continue midodrine - Continue famotidine  Diabetes mellitus, type II ([name]) Glucose controlled adequately off insulin -Continue statin  Acute metabolic encephalopathy- (present on admission) Earlier in hospital stay, developed severe confusion, disorientation.  Likely due to infection, hypoxia, possibly medication side effects.  Antibiotics were broadened, psychotropics were minimized, and this resolved.  Continues to have some improving waxing and waning delirium, very mild, mostly at night.  ESRD (end stage renal disease) ([place])- (present on admission) - Consult Nephrology for maintenance HD, appreciate cares Right IJ catheter was pulled, chest x-ray ordered to  confirm position Nephrology is aware 2/15 Hypokalemia potassium 3.1, potassium 40 mEq x 1 dose given 2/16 potassium 3.2, will be corrected by  hemodialysis as per nephro Hypocalcemia, calcium repleted orally, check vitamin D level  Anemia of chronic disease- (present on admission) -Continue iron, EPO  UTI (urinary tract infection)- (present on admission) - See above  Primary hypertension Patient had low blood pressure so patient was on midodrine 2/15 blood pressure seems to be high, discontinued midodrine - Resumed home metoprolol and hydralazine with holding parameters -We will continue monitor BP and titrate medications accordingly  Sepsis ([name])-resolved as of 01/24/2022, (present on admission) Due to complicated UTI with acute urinary retention, possibly prostatitis.      Body mass index is 27.81 kg/m.   Active Pressure Injury/Wound(s)     Pressure Ulcer  Duration          Pressure Injury 01/21/22 Buttocks Right Stage 2 -  Partial thickness loss of dermis presenting as a shallow open injury with a red, pink wound bed without slough. 8 days            Subjective: No significant overnight events, patient was seen during hemodialysis, lying comfortably.   Physical Exam: Vitals:   01/31/22 1238 01/31/22 1300 01/31/22 1301 01/31/22 1516  BP:   (!) 132/50 (!) 147/41  Pulse: 73 74 73 88  Resp: 15 15 14 18   Temp:  98.5 F (36.9 C)  98.7 F (37.1 C)  TempSrc:  Oral  Oral  SpO2:    96%  Weight:  73.8 kg    Height:         Physical Exam: General:  alert, NAD, lying comfortably in the bed.  Appear in no distress, affect appropriate Eyes: PERRLA ENT: Oral Mucosa Clear, moist  Neck: No JVD,  Cardiovascular: S1 and S2 Present, no Murmur,  Respiratory: good respiratory effort, Bilateral Air entry equal and Decreased, no Crackles, no wheezes Abdomen: Bowel Sound present, Soft and no tenderness,  Skin: no rashes Extremities: no Pedal edema, no calf tenderness Neurologic: without any new focal findings Gait not checked due to patient safety concerns   Data Reviewed:  Labs reviewed and  analyzed  Potassium 3.1---3.2 will be corrected by hemodialysis as per nephro   Family Communication: Discussed with family at bedside 2/15   Disposition: Status is: Inpatient  Remains inpatient appropriate because: Sputum AFB negative x3, Airborne isolation has been discontinued.  Patient is medically stable, awaiting for placement    Planned Discharge Destination: Skilled nursing facility     Time spent: 35 minutes  Author: Val Riles, MD 01/31/2022 4:41 PM  For on call review [email or web address].

## 2022-01-31 NOTE — Progress Notes (Signed)
[facility]  ROUNDING NOTE   Subjective:   [name] was admitted to [facility] on 01/21/2022 for Urinary retention [R33.9] Back pain [M54.9] UTI (urinary tract infection) [N39.0] Pleural effusion [J90] Pleural effusion on right [J90] Acute cystitis with hematuria [N30.01] Chest pain, unspecified type [R07.9] Sepsis, due to unspecified organism, unspecified whether acute organ dysfunction present ([place]) [A41.9] Acute cough [R05.1]  History is difficult to get even with [place] Interpreter.   Patient seen and evaluated during dialysis   HEMODIALYSIS FLOWSHEET:  Blood Flow Rate (mL/min): 400 mL/min Arterial Pressure (mmHg): -170 mmHg Venous Pressure (mmHg): 160 mmHg Transmembrane Pressure (mmHg): 50 mmHg Ultrafiltration Rate (mL/min): 340 mL/min Dialysate Flow Rate (mL/min): 500 ml/min Conductivity: Machine : 13.6 Conductivity: Machine : 13.6 Dialysis Fluid Bolus: Normal Saline Bolus Amount (mL): 250 mL  Denies abdominal pain Continues to complain of pain/discomfort from Foley catheter  Objective:  Vital signs in last 24 hours:  Temp:  [97.8 F (36.6 C)-98.9 F (37.2 C)] 98.4 F (36.9 C) (02/16 0923) Pulse Rate:  [64-77] 70 (02/16 1230) Resp:  [14-19] 15 (02/16 1230) BP: (121-141)/(45-62) 132/48 (02/16 1215) SpO2:  [95 %-98 %] 97 % (02/16 0849) Weight:  [73.4 kg-75.6 kg] 73.4 kg (02/16 0913)  Weight change: 0.2 kg Filed Weights   01/30/22 0500 01/31/22 0516 01/31/22 0913  Weight: 75.8 kg 75.6 kg 73.4 kg    Intake/Output: I/O last 3 completed shifts: In: 150.6 [P.O.:100; IV Piggyback:50.6] Out: 40 [Urine:40]   Intake/Output this shift:  No intake/output data recorded.  Physical Exam: General: NAD, resting in bed  Head: Normocephalic, atraumatic. Moist oral mucosal membranes  Eyes: Anicteric  Lungs:  Diminished in bases, normal effort  Heart: Regular rate and rhythm  Abdomen:  Suprapubic tenderness  Extremities: Trace peripheral  edema.  Neurologic: Nonfocal, moving all four extremities  Skin: No lesions   Access: Left AVF  GU Foley-placed by Urology  Basic Metabolic Panel: Recent Labs  Lab 01/26/22 0525 01/30/22 0519 01/31/22 0537  NA 132* 136 137  K 3.7 3.1* 3.2*  CL 95* 98 101  CO2 24 28 27   GLUCOSE 98 102* 96  BUN 36* 22 28*  CREATININE 6.09* 5.50* 7.05*  CALCIUM 7.0* 6.9* 6.7*  MG  --   --  1.8  PHOS  --  3.1 3.5     Liver Function Tests: Recent Labs  Lab 01/30/22 0519  ALBUMIN 1.6*    No results for input(s): LIPASE, AMYLASE in the last 168 hours.  No results for input(s): AMMONIA in the last 168 hours.   CBC: Recent Labs  Lab 01/25/22 0439 01/26/22 0525 01/30/22 0519 01/31/22 0537  WBC 10.3 9.0 5.1 5.1  HGB 8.1* 8.3* 8.6* 9.2*  HCT 24.2* 25.4* 26.8* 28.6*  MCV 92.0 93.4 92.7 92.3  PLT 189 165 184 187     Cardiac Enzymes: No results for input(s): CKTOTAL, CKMB, CKMBINDEX, TROPONINI in the last 168 hours.  BNP: Invalid input(s): POCBNP  CBG: Recent Labs  Lab 01/28/22 0017 01/28/22 0442 01/28/22 0842 01/28/22 1645 01/28/22 2117  GLUCAP 102* 99 93 129* 12     Microbiology: Results for orders placed or performed during the hospital encounter of 01/21/22  Urine Culture     Status: Abnormal   Collection Time: 01/21/22 10:25 AM   Specimen: Urine, Random  Result Value Ref Range Status   Specimen Description   Final    URINE, RANDOM Performed at [facility], [address], [place], [place] [address]  Special Requests   Final    NONE Performed at [facility], [address] [place], [place] [address]    Culture (A)  Final    >=100,000 COLONIES/mL ESCHERICHIA COLI CORRECTED ON 02/10 AT 1430: PREVIOUSLY REPORTED AS MULTIPLE SPECIES PRESENT, SUGGEST RECOLLECTION Confirmed Extended Spectrum Beta-Lactamase Producer (ESBL).  In bloodstream infections from ESBL organisms, carbapenems are preferred over piperacillin/tazobactam. They are shown  to have a lower risk of mortality.    Report Status 01/28/2022 FINAL  Final   Organism ID, Bacteria ESCHERICHIA COLI (A)  Final      Susceptibility   Escherichia coli - MIC*    AMPICILLIN >=32 RESISTANT Resistant     CEFAZOLIN >=64 RESISTANT Resistant     CEFEPIME 16 RESISTANT Resistant     CEFTRIAXONE >=64 RESISTANT Resistant     CIPROFLOXACIN >=4 RESISTANT Resistant     GENTAMICIN >=16 RESISTANT Resistant     IMIPENEM <=0.25 SENSITIVE Sensitive     NITROFURANTOIN <=16 SENSITIVE Sensitive     TRIMETH/SULFA >=320 RESISTANT Resistant     AMPICILLIN/SULBACTAM >=32 RESISTANT Resistant     PIP/TAZO 8 SENSITIVE Sensitive     * >=100,000 COLONIES/mL ESCHERICHIA COLI CORRECTED ON 02/10 AT 1430: PREVIOUSLY REPORTED AS MULTIPLE SPECIES PRESENT, SUGGEST RECOLLECTION  Blood Culture (routine x 2)     Status: None   Collection Time: 01/21/22  1:12 PM   Specimen: BLOOD  Result Value Ref Range Status   Specimen Description BLOOD BLOOD RIGHT ARM  Final   Special Requests   Final    BOTTLES DRAWN AEROBIC AND ANAEROBIC Blood Culture adequate volume   Culture   Final    NO GROWTH 5 DAYS Performed at [facility], [address] [place], [place] [address]    Report Status 01/26/2022 FINAL  Final  Blood Culture (routine x 2)     Status: None   Collection Time: 01/21/22  1:12 PM   Specimen: BLOOD  Result Value Ref Range Status   Specimen Description BLOOD BLOOD RIGHT FOREARM  Final   Special Requests   Final    BOTTLES DRAWN AEROBIC AND ANAEROBIC Blood Culture adequate volume   Culture   Final    NO GROWTH 5 DAYS Performed at [facility], [address] [place], [place] [address]    Report Status 01/26/2022 FINAL  Final  Acid Fast Smear (AFB)     Status: None   Collection Time: 01/21/22  4:02 PM   Specimen: Chest; Body Fluid  Result Value Ref Range Status   AFB Specimen Processing Concentration  Final   Acid Fast Smear Negative  Final    Comment: (NOTE) Performed At:  [facility] [place], [place] [id number] Rush Farmer MD [id number]    Source (AFB) NONE  Final    Comment: Performed at [facility], [address] [place] [address], [place], [place] [address]  Body fluid culture w Gram Stain     Status: None   Collection Time: 01/21/22  4:02 PM   Specimen: Chest; Body Fluid  Result Value Ref Range Status   Specimen Description   Final    CHEST FLUID Performed at [facility], [place] [address] [place], [place] [address]    Special Requests   Final    NONE Performed at [facility], [address] [place], [place] [address]    Gram Stain   Final    NO WBC SEEN NO ORGANISMS SEEN Performed at [facility], [address], [place], [place] [address]    Culture   Final    FEW ESCHERICHIA COLI Confirmed Extended Spectrum Beta-Lactamase Producer (ESBL).  In bloodstream infections from ESBL organisms, carbapenems are preferred over piperacillin/tazobactam. They are shown to have a lower risk of mortality.    Report Status 01/25/2022 FINAL  Final   Organism ID, Bacteria ESCHERICHIA COLI  Final      Susceptibility   Escherichia coli - MIC*    AMPICILLIN >=32 RESISTANT Resistant     CEFAZOLIN >=64 RESISTANT Resistant     CEFEPIME 16 RESISTANT Resistant     CEFTAZIDIME RESISTANT Resistant     CEFTRIAXONE >=64 RESISTANT Resistant     CIPROFLOXACIN >=4 RESISTANT Resistant     GENTAMICIN >=16 RESISTANT Resistant     IMIPENEM <=0.25 SENSITIVE Sensitive     TRIMETH/SULFA >=320 RESISTANT Resistant     AMPICILLIN/SULBACTAM >=32 RESISTANT Resistant     PIP/TAZO 8 SENSITIVE Sensitive     * FEW ESCHERICHIA COLI  Aerobic/Anaerobic Culture w Gram Stain (surgical/deep wound)     Status: None   Collection Time: 01/22/22  3:56 PM   Specimen: Pleural Fluid  Result Value Ref Range Status   Specimen Description   Final    PLEURAL RIGHT Performed at [facility], [address] [place], [place] [address]     Special Requests   Final    PLE FLU Performed at [facility], [address] [place], [place] [address]    Gram Stain   Final    RARE WBC PRESENT, PREDOMINANTLY MONONUCLEAR NO ORGANISMS SEEN    Culture   Final    No growth aerobically or anaerobically. Performed at [facility], [place] [address], [place],  [address]    Report Status 01/28/2022 FINAL  Final  Resp Panel by RT-PCR (Flu A&B, Covid) Nasopharyngeal Swab     Status: None   Collection Time: 01/23/22  4:52 PM   Specimen: Nasopharyngeal Swab; Nasopharyngeal(NP) swabs in vial transport medium  Result Value Ref Range Status   SARS Coronavirus 2 by RT PCR NEGATIVE NEGATIVE Final    Comment: (NOTE) SARS-CoV-2 target nucleic acids are NOT DETECTED.  The SARS-CoV-2 RNA is generally detectable in upper respiratory specimens during the acute phase of infection. The lowest concentration of SARS-CoV-2 viral copies this assay can detect is 138 copies/mL. A negative result does not preclude SARS-Cov-2 infection and should not be used as the sole basis for treatment or other patient management decisions. A negative result may occur with  improper specimen collection/handling, submission of specimen other than nasopharyngeal swab, presence of viral mutation(s) within the areas targeted by this assay, and inadequate number of viral copies(<138 copies/mL). A negative result must be combined with clinical observations, patient history, and epidemiological information. The expected result is Negative.  Fact Sheet for Patients:  EntrepreneurPulse.com.au  Fact Sheet for Healthcare Providers:  IncredibleEmployment.be  This test is no t yet approved or cleared by the [place] FDA and  has been authorized for detection and/or diagnosis of SARS-CoV-2 by FDA under an Emergency Use Authorization (EUA). This EUA will remain  in effect (meaning this test can be used) for the  duration of the COVID-19 declaration under Section 564(b)(1) of the Act, 21 U.S.C.section 360bbb-3(b)(1), unless the authorization is terminated  or revoked sooner.       Influenza A by PCR NEGATIVE NEGATIVE Final   Influenza B by PCR NEGATIVE NEGATIVE Final    Comment: (NOTE)  The Xpert Xpress SARS-CoV-2/FLU/RSV plus assay is intended as an aid in the diagnosis of influenza from Nasopharyngeal swab specimens and should not be used as a sole basis for treatment. Nasal washings and aspirates are unacceptable for Xpert Xpress SARS-CoV-2/FLU/RSV testing.  Fact Sheet for Patients: EntrepreneurPulse.com.au  Fact Sheet for Healthcare Providers: IncredibleEmployment.be  This test is not yet approved or cleared by the [place] FDA and has been authorized for detection and/or diagnosis of SARS-CoV-2 by FDA under an Emergency Use Authorization (EUA). This EUA will remain in effect (meaning this test can be used) for the duration of the COVID-19 declaration under Section 564(b)(1) of the Act, 21 U.S.C. section 360bbb-3(b)(1), unless the authorization is terminated or revoked.  Performed at [facility], [address] [place], [place] [address]   Aerobic/Anaerobic Culture w Gram Stain (surgical/deep wound)     Status: None   Collection Time: 01/25/22 12:00 PM   Specimen: Pleura  Result Value Ref Range Status   Specimen Description   Final    PLEURAL Performed at [facility], [address] [place], [place] [address]    Special Requests   Final    RIGHT CHEST TUBE Performed at [facility], [place], [place] [address]    Gram Stain NO WBC SEEN NO ORGANISMS SEEN   Final   Culture   Final    No growth aerobically or anaerobically. Performed at [facility], [place] [address] [place], [place] [address]    Report Status 01/30/2022 FINAL  Final  Acid Fast Smear (AFB)     Status: None    Collection Time: 01/25/22 12:05 PM   Specimen: Sputum  Result Value Ref Range Status   AFB Specimen Processing Concentration  Final   Acid Fast Smear Negative  Final    Comment: (NOTE) Performed At: [facility] [address] [place], [place] [id number] Rush Farmer MD CV:[id number]    Source (AFB) PLEURAL  Final    Comment: Performed at [facility], [address] [place], [place] [address]  Acid Fast Smear (AFB)     Status: None   Collection Time: 01/26/22  4:00 PM   Specimen: Sputum  Result Value Ref Range Status   AFB Specimen Processing Concentration  Final   Acid Fast Smear Negative  Final    Comment: (NOTE) Performed At: [facility] [place], [place] [id number] Rush Farmer MD [id number]    Source (AFB) EXPECTORATED SPUTUM  Final    Comment: Performed at [facility], [address] [place], [place] [address]  Acid Fast Smear (AFB)     Status: None   Collection Time: 01/27/22  2:51 PM   Specimen: Sputum  Result Value Ref Range Status   AFB Specimen Processing Concentration  Final   Acid Fast Smear Negative  Final    Comment: (NOTE) Performed At: [facility] [place], [place] [id number] Rush Farmer MD [id number]    Source (AFB) SPU  Final    Comment: Performed at [facility], [address] [place], [place] [address]    Coagulation Studies: No results for input(s): LABPROT, INR in the last 72 hours.   Urinalysis: No results for input(s): COLORURINE, LABSPEC, PHURINE, GLUCOSEU, HGBUR, BILIRUBINUR, KETONESUR, PROTEINUR, UROBILINOGEN, NITRITE, LEUKOCYTESUR in the last 72 hours.  Invalid input(s): APPERANCEUR     Imaging: IR US Guide Vasc Access Right  Result Date: 01/30/2022 INDICATION: 81 year old male referred for tunneled central  venous catheter EXAM: IMAGE GUIDED TUNNELED CENTRAL VENOUS CATHETER MEDICATIONS: None ANESTHESIA/SEDATION: Moderate (conscious)  sedation was employed during this procedure. A total of Versed 1.0 mg and Fentanyl 50 mcg was administered intravenously by the radiology nurse. Total intra-service moderate Sedation Time: 11 minutes. The patient's level of consciousness and vital signs were monitored continuously by radiology nursing throughout the procedure under my direct supervision. FLUOROSCOPY: Radiation Exposure Index (as provided by the fluoroscopic device): 4.9 mGy Kerma COMPLICATIONS: None PROCEDURE: Informed written consent was obtained from the patient after a thorough discussion of the procedural risks, benefits and alternatives. All questions were addressed. Maximal Sterile Barrier Technique was utilized including caps, mask, sterile gowns, sterile gloves, sterile drape, hand hygiene and skin antiseptic. A timeout was performed prior to the initiation of the procedure. After written informed consent was obtained, patient was placed in the supine position on angiographic table. Patency of the right internal jugular vein was confirmed with ultrasound with image documentation. Patient was prepped and draped in the usual sterile fashion including the right neck and right superior chest. Using ultrasound guidance, the skin and subcutaneous tissues overlying the right internal jugular vein were generously infiltrated with 1% lidocaine without epinephrine. Using ultrasound guidance, the right internal jugular vein was punctured with a micropuncture needle, and an 018 wire was advanced into the right heart confirming venous access. A small stab incision was made with an 11 blade scalpel. Peel-away sheath was placed over the wire, and then the wire was removed, marking the wire for estimation of internal catheter length. The chest wall was then generously infiltrated with 1% lidocaine for local anesthesia along the tissue tract. Small stab incision was made with 11 blade scalpel, and then the catheter was back tunneled to the puncture site at  the right internal jugular vein. Catheter was pulled through the tract, with the catheter amputated at 21 cm. Catheter was advanced through the peel-away sheath, and the peel-away sheath was removed. Final image was stored. The catheter was anchored to the chest wall with 2 retention sutures, and Derma bond was used to seal the right internal jugular vein incision site and at the right chest wall. Patient tolerated the procedure well and remained hemodynamically stable throughout. No complications were encountered and no significant blood loss was encountered. IMPRESSION: Status post image guided placement of right IJ tunneled, cuffed central venous catheter. Signed, Dulcy Fanny. Dellia Nims, RPVI [facility] Interventional Radiology Specialists [facility] Rad[facility] Electronically Signed   By: Corrie Mckusick D.O.   On: 01/30/2022 08:05   DG Chest Port 1 View  Result Date: 01/30/2022 CLINICAL DATA:  Placement of central venous catheter EXAM: PORTABLE CHEST 1 VIEW COMPARISON:  Previous studies including the examination of 01/29/2022 FINDINGS: There is interval placement of right IJ central venous catheter with its tip in the superior vena cava. There is no pneumothorax. Transverse diameter of heart is increased. There is homogeneous opacity in the right mid and right lower lung fields. Linear densities seen in the left lower lung fields. No significant interval changes are noted. There is blunting of right lateral CP angle. IMPRESSION: Tip of right IJ central venous catheter is seen in the superior vena cava. There is no pneumothorax. No significant interval changes are noted in the lung fields. Electronically Signed   By: Elmer Picker M.D.   On: 01/30/2022 15:42   IR TUNNELED CENTRAL VENOUS CATHETER PLACEMENT  Result Date: 01/30/2022 INDICATION: 81 year old male referred for tunneled central venous catheter EXAM: IMAGE GUIDED TUNNELED  CENTRAL VENOUS CATHETER MEDICATIONS: None ANESTHESIA/SEDATION: Moderate  (conscious) sedation was employed during this procedure. A total of Versed 1.0 mg and Fentanyl 50 mcg was administered intravenously by the radiology nurse. Total intra-service moderate Sedation Time: 11 minutes. The patient's level of consciousness and vital signs were monitored continuously by radiology nursing throughout the procedure under my direct supervision. FLUOROSCOPY: Radiation Exposure Index (as provided by the fluoroscopic device): 4.9 mGy Kerma COMPLICATIONS: None PROCEDURE: Informed written consent was obtained from the patient after a thorough discussion of the procedural risks, benefits and alternatives. All questions were addressed. Maximal Sterile Barrier Technique was utilized including caps, mask, sterile gowns, sterile gloves, sterile drape, hand hygiene and skin antiseptic. A timeout was performed prior to the initiation of the procedure. After written informed consent was obtained, patient was placed in the supine position on angiographic table. Patency of the right internal jugular vein was confirmed with ultrasound with image documentation. Patient was prepped and draped in the usual sterile fashion including the right neck and right superior chest. Using ultrasound guidance, the skin and subcutaneous tissues overlying the right internal jugular vein were generously infiltrated with 1% lidocaine without epinephrine. Using ultrasound guidance, the right internal jugular vein was punctured with a micropuncture needle, and an 018 wire was advanced into the right heart confirming venous access. A small stab incision was made with an 11 blade scalpel. Peel-away sheath was placed over the wire, and then the wire was removed, marking the wire for estimation of internal catheter length. The chest wall was then generously infiltrated with 1% lidocaine for local anesthesia along the tissue tract. Small stab incision was made with 11 blade scalpel, and then the catheter was back tunneled to the  puncture site at the right internal jugular vein. Catheter was pulled through the tract, with the catheter amputated at 21 cm. Catheter was advanced through the peel-away sheath, and the peel-away sheath was removed. Final image was stored. The catheter was anchored to the chest wall with 2 retention sutures, and Derma bond was used to seal the right internal jugular vein incision site and at the right chest wall. Patient tolerated the procedure well and remained hemodynamically stable throughout. No complications were encountered and no significant blood loss was encountered. IMPRESSION: Status post image guided placement of right IJ tunneled, cuffed central venous catheter. Signed, Dulcy Fanny. Dellia Nims, RPVI [facility] Inter[facility] Radiology Special[facility] Radiology Electronically Signed   By: Corrie Mckusick D.O.   On: 01/30/2022 08:05     Medications:    sodium chloride     ertapenem Stopped (01/30/22 1909)    sodium chloride   Intravenous Once   allopurinol  50 mg Oral Daily   atorvastatin  20 mg Oral Daily   Chlorhexidine Gluconate Cloth  6 each Topical Q0600   cholecalciferol  1,000 Units Oral Daily   epoetin alfa       epoetin (EPOGEN/PROCRIT) injection  10,000 Units Intravenous Q T,Th,Sa-HD   famotidine  20 mg Oral Daily   ferrous sulfate  325 mg Oral BID WC   heparin injection (subcutaneous)  5,000 Units Subcutaneous Q8H   hydrALAZINE  50 mg Oral TID   lactulose  20 g Oral TID   metoprolol succinate  25 mg Oral Daily   senna-docusate  2 tablet Oral QHS   sodium chloride flush  3 mL Intravenous Q12H   tamsulosin  0.4 mg Oral QPC supper     Assessment/ Plan:  [name] is a  81 y.o. Hispanic male who speaks [place] and limited [place] and [place].  Patient with end stage renal disease on hemodialysis, hypertension, BPH, diabetes mellitus type II, hyperlipidemia who presents to [facility] on 01/21/2022 for Urinary retention [R33.9] Back pain [M54.9] UTI  (urinary tract infection) [N39.0] Pleural effusion [J90] Pleural effusion on right [J90] Acute cystitis with hematuria [N30.01] Chest pain, unspecified type [R07.9] Sepsis, due to unspecified organism, unspecified whether acute organ dysfunction present ([place]) [A41.9] Acute cough [R05.1]   [facility] TTS [facility] Left AVF 73kg.    End stage renal disease: Continue TTS schedule.   Currently receiving scheduled dialysis, UF goal 0.5 L as tolerated. Next treatment scheduled for Saturday Dialysis coordinator aware of patient and awaiting discharge planning to determine outpatient needs.   Anemia of chronic kidney disease: Hgb 9.2 - Continue EPO with dialysis treatments   Hypertension: .Home regimen includes hydralazine, metoprolol and tamsulosin.  BP 132/50 during dialysis   Secondary Hyperparathyroidism: Continue cholecalciferol daily.   -Calcium remains below desired target at 6.7.  Dialysis performed with 3 calcium bath.  We will continue to monitor  5.  Right pleural effusion likely due to rib fracture.  Posttraumatic hemorrhagic pleural effusion.  Right chest tube placed on 01/22/2022 and removed on 01/28/22.  Testing sent on drainage.  Found to have e coli. Pulmonology and ID  following.  -Receiving Meropenem.  - PICC placed for continued antibiotic therapy after discharge. Ertapenem 500mg  IV daily for 6 weeks    LOS: 10   2/16/202312:41 PM

## 2022-01-31 NOTE — TOC Progression Note (Addendum)
Transition of Care [facility]) - Progression Note    Patient Details  Name: Martin Gilbert MRN: [id number] Date of Birth: 1941-05-27  Transition of Care [facility]) CM/SW [place], LCSW Phone Number: 01/31/2022, 12:53 PM  Clinical Narrative:  [facility] and [facility] are still unable to offer a bed even though patient is off airborne precautions. Left message for [facility] admissions coordinator to see if they can reconsider.   2:04 pm: Sent daughter text message with bed offers and link to CMS website so she can review ratings.  Expected Discharge Plan: [place] (vs home health) Barriers to Discharge: Continued Medical Work up  Expected Discharge Plan and Services Expected Discharge Plan: [place] (vs home health)     Post Acute Care Choice:  (TBD) Living arrangements for the past 2 months: Single Family Home                                       Social Determinants of Health (SDOH) Interventions    Readmission Risk Interventions Readmission Risk Prevention Plan 04/16/2021  Transportation Screening Complete  Home Care Screening Complete  Medication Review (RN CM) Complete  Some recent data might be hidden

## 2022-02-01 DIAGNOSIS — N410 Acute prostatitis: Secondary | ICD-10-CM | POA: Diagnosis not present

## 2022-02-01 DIAGNOSIS — Z1612 Extended spectrum beta lactamase (ESBL) resistance: Secondary | ICD-10-CM | POA: Diagnosis not present

## 2022-02-01 DIAGNOSIS — A498 Other bacterial infections of unspecified site: Secondary | ICD-10-CM | POA: Diagnosis not present

## 2022-02-01 DIAGNOSIS — N412 Abscess of prostate: Secondary | ICD-10-CM | POA: Diagnosis not present

## 2022-02-01 LAB — BASIC METABOLIC PANEL
Anion gap: 9 (ref 5–15)
BUN: 16 mg/dL (ref 8–23)
CO2: 31 mmol/L (ref 22–32)
Calcium: 7.4 mg/dL — ABNORMAL LOW (ref 8.9–10.3)
Chloride: 96 mmol/L — ABNORMAL LOW (ref 98–111)
Creatinine, Ser: 4.71 mg/dL — ABNORMAL HIGH (ref 0.61–1.24)
GFR, Estimated: 12 mL/min — ABNORMAL LOW (ref >60)
Glucose, Bld: 103 mg/dL — ABNORMAL HIGH (ref 70–99)
Potassium: 3.4 mmol/L — ABNORMAL LOW (ref 3.5–5.1)
Sodium: 136 mmol/L (ref 135–145)

## 2022-02-01 LAB — CBC
HCT: 29.2 % — ABNORMAL LOW (ref 39.0–52.0)
Hemoglobin: 9.6 g/dL — ABNORMAL LOW (ref 13.0–17.0)
MCH: 30.5 pg (ref 26.0–34.0)
MCHC: 32.9 g/dL (ref 30.0–36.0)
MCV: 92.7 fL (ref 80.0–100.0)
Platelets: 192 10*3/uL (ref 150–400)
RBC: 3.15 MIL/uL — ABNORMAL LOW (ref 4.22–5.81)
RDW: 17.3 % — ABNORMAL HIGH (ref 11.5–15.5)
WBC: 5.2 10*3/uL (ref 4.0–10.5)
nRBC: 0 % (ref 0.0–0.2)

## 2022-02-01 LAB — MAGNESIUM: Magnesium: 1.7 mg/dL (ref 1.7–2.4)

## 2022-02-01 LAB — PHOSPHORUS: Phosphorus: 2.6 mg/dL (ref 2.5–4.6)

## 2022-02-01 MED ORDER — POTASSIUM CHLORIDE CRYS ER 20 MEQ PO TBCR
20.0000 meq | EXTENDED_RELEASE_TABLET | Freq: Once | ORAL | Status: AC
  Administered 2022-02-01: 20 meq via ORAL
  Filled 2022-02-01: qty 1

## 2022-02-01 NOTE — Progress Notes (Signed)
Progress Note   Patient: Martin Gilbert [id number] DOB: 02/09/1941 DOA: 01/21/2022     11 DOS: the patient was seen and examined on 02/01/2022   Brief hospital course: [name] is an 81 y.o. M with ESRD on HD, HTN, DM, dCHF who presented with abdominal pain for 3 weeks.  In the ER, urinalysis (still makes significant urine) showed UTI.  Imaging showed low-density area near prostate concerning ofr prostatitis vs abscess.  Urology consulted and admitted on antibiotics.  Also noted to have loculated RIGHT pleural effusion.   2/6: Admitted on antibiotics, had diagnostic thoracentesis 2/7: CT surgery recommended pigtail catheter, placed 2nd day 2/8: Developed delirium 2/10: Chest fluid culture growing ESBL E coli 2/11: Delirium completely resolved 2/12: Family have elected not to do tPA in pleural catheter 2/14: Chest tube removed  Assessment and Plan: * Acute prostatitis Patient presented with suprapubic pain, difficulty urinating.  CAT scan showed a small lucency, possible prostatitis versus prostatic abscess.  Urology were consulted, recommended medical treatment and outpatient follow-up.  Sepsis was ruled out.  Urine culture with ESBL - Continue meropenem, day 10 of 42 with end date 3/23  - Plan for Ertapenem 500 IV daily via IJ PICC line to be placed today - Weekly CBC/d and CMP while on Ertapenem - Consult ID, appreciate cares  - Continue Flomax - Maintain foley at discharge --> Needs Urology follow up with Dr. Bernardo Gilbert      Gout -Continue allopurinol  Pressure injury of right buttock, stage 2 ([place])- (present on admission) - Mepilex dressing  Acute respiratory failure with hypoxia ([place]) At baseline the patient does not use oxygen.  Here he had hypoxia and dyspnea and he required up to 5 and 6 L of oxygen at times due to effusion, now weaned to room air.     Elevated troponin Ischemia ruled out.  Due to poor clearance, non-infarction troponin  elevation.  Urinary retention - See above  Pleural effusion Martin Gilbert of this seems to be traumatic hemothorax, now organized.  Chest tube was placed without resolution of the effusion.  Thoracentesis culture grew ESBL, ?contaminant.  Later culture from chest tube placement showed no growth.  Discussed the nonresolving effusion with CT surgery, not surgical candidate.  Shared decision making regarding using lytics to reduce the size of the effusion was discussed with family, but ultimately it was elected to remove chest tube, do-nothing.  Given initial uncertainty about etiology of effusion and ?tuberculosis, Quantiferon gold was obtained --> this showed evidence of prior exposure.    We ruled out TB with 3 acid-fast smears negative, Low clinical suspicion for TB as well. ID following  Chronic diastolic CHF (congestive heart failure) ([place])- (present on admission) Acute CHF ruled out. - Minimize fluids   Hepatic cirrhosis ([place])- (present on admission) - Continue Lactulose - Continue midodrine - Continue famotidine  Diabetes mellitus, type II ([name]) Glucose controlled adequately off insulin -Continue statin  Acute metabolic encephalopathy- (present on admission) Earlier in hospital stay, developed severe confusion, disorientation.  Likely due to infection, hypoxia, possibly medication side effects.  Antibiotics were broadened, psychotropics were minimized, and this resolved.  Continues to have some improving waxing and waning delirium, very mild, mostly at night.  ESRD (end stage renal disease) ([place])- (present on admission) - Consult Nephrology for maintenance HD, appreciate cares Right IJ catheter was pulled, chest x-ray ordered to confirm position Nephrology is aware 2/15 Hypokalemia potassium 3.1, potassium 40 mEq x 1 dose given potassium 3.2---3.4 nephro following Hypocalcemia, calcium  repleted orally, vitamin D level 34   Anemia of chronic disease- (present on  admission) -Continue iron, EPO  UTI (urinary tract infection)- (present on admission) - See above  Primary hypertension Patient had low blood pressure so patient was on midodrine 2/15 blood pressure seems to be high, discontinued midodrine - Resumed home metoprolol and hydralazine with holding parameters -We will continue monitor BP and titrate medications accordingly  Sepsis ([name])-resolved as of 01/24/2022, (present on admission) Due to complicated UTI with acute urinary retention, possibly prostatitis.      Body mass index is 27.81 kg/m.   Active Pressure Injury/Wound(s)     Pressure Ulcer  Duration          Pressure Injury 01/21/22 Buttocks Right Stage 2 -  Partial thickness loss of dermis presenting as a shallow open injury with a red, pink wound bed without slough. 8 days            Subjective: No significant overnight events, patient was lying comfortably in the bed, denied any active issues, no pain.   Physical Exam: Vitals:   01/31/22 2151 02/01/22 0500 02/01/22 0619 02/01/22 0813  BP: (!) 115/43  (!) 145/55 (!) 127/46  Pulse: 73  68 71  Resp: 20  20 16   Temp: 99 F (37.2 C)  99.2 F (37.3 C) 97.8 F (36.6 C)  TempSrc: Oral  Oral Oral  SpO2: 95%  98% 100%  Weight:  54.3 kg    Height:         Physical Exam: General:  alert, NAD, lying comfortably in the bed.  Appear in no distress, affect appropriate Eyes: PERRLA ENT: Oral Mucosa Clear, moist  Neck: No JVD,  Cardiovascular: S1 and S2 Present, no Murmur,  Respiratory: good respiratory effort, Bilateral Air entry equal and Decreased, no Crackles, no wheezes Abdomen: Bowel Sound present, Soft and no tenderness,  Skin: no rashes Extremities: no Pedal edema, no calf tenderness Neurologic: without any new focal findings Gait not checked due to patient safety concerns   Data Reviewed:  Labs reviewed and analyzed  Potassium 3.1---3.2 will be corrected by hemodialysis as per nephro   Family  Communication: Discussed with family at bedside 2/15   Disposition: Status is: Inpatient  Remains inpatient appropriate because: Sputum AFB negative x3, Airborne isolation has been discontinued.  Patient is medically stable, awaiting for placement    Planned Discharge Destination: Skilled nursing facility     Time spent: 35 minutes  Author: Val Riles, MD 02/01/2022 2:13 PM  For on call review [email or web address].

## 2022-02-01 NOTE — Progress Notes (Signed)
Date of Admission:  01/21/2022     ID: Martin Gilbert is a 81 y.o. male  Principal Problem:   Acute prostatitis Active Problems:   Primary hypertension   UTI (urinary tract infection)   Anemia of chronic disease   ESRD (end stage renal disease) ([name])   Acute metabolic encephalopathy   Diabetes mellitus, type II ([name])   Hepatic cirrhosis ([name])   Chronic diastolic CHF (congestive heart failure) ([name])   Pleural effusion   Urinary retention   Elevated troponin   Acute respiratory failure with hypoxia ([name])   Pressure injury of right buttock, stage 2 ([name])   Gout   Hemothorax on right    Subjective: Feeling better No pain abdomen Appetite better   Medications:   sodium chloride   Intravenous Once   allopurinol  50 mg Oral Daily   atorvastatin  20 mg Oral Daily   calcium carbonate  1 tablet Oral BID WC   Chlorhexidine Gluconate Cloth  6 each Topical Q0600   cholecalciferol  1,000 Units Oral Daily   epoetin (EPOGEN/PROCRIT) injection  10,000 Units Intravenous Q T,Th,Sa-HD   famotidine  20 mg Oral Daily   ferrous sulfate  325 mg Oral BID WC   heparin injection (subcutaneous)  5,000 Units Subcutaneous Q8H   hydrALAZINE  50 mg Oral TID   lactulose  20 g Oral TID   metoprolol succinate  25 mg Oral Daily   senna-docusate  2 tablet Oral QHS   sodium chloride flush  3 mL Intravenous Q12H   tamsulosin  0.4 mg Oral QPC supper    Objective: Vital signs in last 24 hours: Temp:  [97.8 F (36.6 C)-99.2 F (37.3 C)] 98 F (36.7 C) (02/17 1943) Pulse Rate:  [68-72] 72 (02/17 1943) Resp:  [16-20] 18 (02/17 1943) BP: (123-145)/(43-57) 137/57 (02/17 1943) SpO2:  [96 %-100 %] 97 % (02/17 1943) Weight:  [54.3 kg] 54.3 kg (02/17 0500)  PHYSICAL EXAM:  General: Alert, cooperative, no distress, chronically ill  head: Normocephalic, without obvious abnormality, atraumatic. Eyes: Conjunctivae clear, anicteric sclerae. Pupils are equal ENT Nares normal. No drainage or sinus  tenderness. Lips, mucosa, and tongue normal. No Thrush Neck: Supple, symmetrical, no adenopathy, thyroid: non tender no carotid bruit and no JVD. Back: No CVA tenderness. Lungs: b/l air entry Heart: Regular rate and rhythm, no murmur, rub or gallop. Abdomen: Soft, non-tender,not distended. Bowel sounds normal. No masses Foley present Extremities: Right forearm fistula skin: No rashes or lesions. Or bruising Lymph: Cervical, supraclavicular normal. Neurologic: Grossly non-focal  Lab Results Recent Labs    01/31/22 0537 02/01/22 0607  WBC 5.1 5.2  HGB 9.2* 9.6*  HCT 28.6* 29.2*  NA 137 136  K 3.2* 3.4*  CL 101 96*  CO2 27 31  BUN 28* 16  CREATININE 7.05* 4.71*    Microbiology: Urine culture ESBL E. coli Pleural fluid aspiration ESBL E. coli and Studies/Results: No results found.   Assessment/Plan: Prostatic abscesses. ESBL E. coli in urine culture Will need meropenem for a total of 6 weeks on he will get ertapenem 500 mg every day until  03/07/2022. Because of end-stage renal disease PICC cannot be placed and he has IJ cath  Right loculated pleural effusion: Aspiration culture was positive for ESBL E. coli but repeat cultures have been negative.  He is anyway going to be treated for this bacteria as it is present in his urine culture and the cause for the prostatic abscesses.  QuantiFERON gold positive for 2  sputum's for AFB has been negative.  Also less likely to be tuberculosis.    End-stage disease on dialysis  Anemia of chronic disease  CHF Urinary retention on admission and patient has foley Would recommend discussing with urologist for  voiding trial as the prostatic abscess s being treated now  OPAT orders has been placed.  Nursing Need to discuss with his daughter and educate her about doing IV antibiotics at home. Please do not send patient home until this has been done as wife cannot do it as she is blind  ID will follow him peripherally this weekend-  call f needed    OPAT Orders Discharge antibiotics: Ertapenem 500mg  IV every 24 hours for a total of 6 weeks End Date: 03/07/22     [name] Care Per Protocol:   Labs weekly while on IV antibiotics: _X_ CBC with differential   _X_ CMP       __ Please Schedule removal of IJ PICC with Intervention radiology [facility] on 03/08/22     Fax weekly labs to [phone]   Clinic Follow Up Appt: 03/07/22 at 11.30AM

## 2022-02-01 NOTE — TOC Progression Note (Signed)
Transition of Care [facility]) - Progression Note    Patient Details  Name: Martin Gilbert MRN: [id number] Date of Birth: 1941/05/06  [facility]) CM/SW Contact  Beverly Sessions, RN Phone Number: 02/01/2022, 2:45 PM  Clinical Narrative:     Received return call from daughter.  She confirms multiple times they are not interested in the 4 bed offers  I notified that daughter since they are declining the bed offers patient would have to  return home with home health. Daughter states " we aren't declining, we just don't want those".  I notified her that all facilities in [facility] were not able to offer a bed.   She is aware that plan is for discharge tomorrow with resumption of home health orders through [facility]. I inquired if she was going to be able to assist with daily IV antibiotics she states "I guess we will have to"  Referral made to [facility] with [facility].  Malachy Mood with [employer] updated   Expected Discharge Plan: [place] (vs home health) Barriers to Discharge: Continued Medical Work up  Expected Discharge Plan and Services Expected Discharge Plan: [place] (vs home health)     Post Acute Care Choice:  (TBD) Living arrangements for the past 2 months: Single Family Home                                       Social Determinants of Health (SDOH) Interventions    Readmission Risk Interventions Readmission Risk Prevention Plan 04/16/2021  Transportation Screening Complete  Home Care Screening Complete  Medication Review (RN CM) Complete  Some recent data might be hidden

## 2022-02-01 NOTE — Plan of Care (Signed)
  Problem: Activity: Goal: Risk for activity intolerance will decrease Outcome: Progressing   Problem: Skin Integrity: Goal: Risk for impaired skin integrity will decrease Outcome: Progressing   

## 2022-02-01 NOTE — TOC Progression Note (Signed)
Transition of Care [facility]) - Progression Note    Patient Details  Name: Martin Gilbert MRN: [id number] Date of Birth: 09/22/1941  Transition of Care [facility]) CM/SW Contact  Beverly Sessions, RN Phone Number: 02/01/2022, 2:11 PM  Clinical Narrative:    Called daughter to follow up on bed offers.  Voicemail full.  Text message sent requesting return call  Once bed offer has been accepted patient's HD center will have to be switched    Expected Discharge Plan: [place] (vs home health) Barriers to Discharge: Continued Medical Work up  Expected Discharge Plan and Services Expected Discharge Plan: [place] (vs home health)     Post Acute Care Choice:  (TBD) Living arrangements for the past 2 months: Single Family Home                                       Social Determinants of Health (SDOH) Interventions    Readmission Risk Interventions Readmission Risk Prevention Plan 04/16/2021  Transportation Screening Complete  Home Care Screening Complete  Medication Review (RN CM) Complete  Some recent data might be hidden

## 2022-02-01 NOTE — Progress Notes (Signed)
PT Cancellation Note  Patient Details Name: Luisfelipe Engelstad MRN: [id number] DOB: 07-18-1941   Cancelled Treatment:    Reason Eval/Treat Not Completed: Patient declined to participate with PT services this date secondary to fatigue. Pt stated that combination of having HD as well as participating with OT recently that he is too fatigued and requested no return attempt at PT this date.  Will attempt to see pt at a future date/time as medically appropriate.     Linus Salmons PT, DPT 02/01/22, 2:57 PM

## 2022-02-01 NOTE — Progress Notes (Signed)
[facility]  ROUNDING NOTE   Subjective:   [name] was admitted to [facility] on 01/21/2022 for Urinary retention [R33.9] Back pain [M54.9] UTI (urinary tract infection) [N39.0] Pleural effusion [J90] Pleural effusion on right [J90] Acute cystitis with hematuria [N30.01] Chest pain, unspecified type [R07.9] Sepsis, due to unspecified organism, unspecified whether acute organ dysfunction present ([place]) [A41.9] Acute cough [R05.1]  History is difficult to get even with [place] Interpreter.   Patient seen resting quietly in bed No family at bedside Denies pain and discomfort  Dialysis yesterday, tolerated well  Objective:  Vital signs in last 24 hours:  Temp:  [97.8 F (36.6 C)-99.2 F (37.3 C)] 97.8 F (36.6 C) (02/17 0813) Pulse Rate:  [68-88] 71 (02/17 0813) Resp:  [16-20] 16 (02/17 0813) BP: (115-147)/(41-55) 127/46 (02/17 0813) SpO2:  [95 %-100 %] 100 % (02/17 0813) Weight:  [54.3 kg] 54.3 kg (02/17 0500)  Weight change: -2.2 kg Filed Weights   01/31/22 0913 01/31/22 1300 02/01/22 0500  Weight: 73.4 kg 73.8 kg 54.3 kg    Intake/Output: I/O last 3 completed shifts: In: 50.6 [IV Piggyback:50.6] Out: 504 [Other:504]   Intake/Output this shift:  No intake/output data recorded.  Physical Exam: General: NAD, resting in bed  Head: Normocephalic, atraumatic. Moist oral mucosal membranes  Eyes: Anicteric  Lungs:  Diminished in bases, normal effort  Heart: Regular rate and rhythm  Abdomen:  Soft, nontender  Extremities: 1+ peripheral edema.  Neurologic: Nonfocal, moving all four extremities  Skin: No lesions   Access: Left AVF  GU Foley-placed by Urology  Basic Metabolic Panel: Recent Labs  Lab 01/26/22 0525 01/30/22 0519 01/31/22 0537 02/01/22 0607  NA 132* 136 137 136  K 3.7 3.1* 3.2* 3.4*  CL 95* 98 101 96*  CO2 24 28 27 31   GLUCOSE 98 102* 96 103*  BUN 36* 22 28* 16  CREATININE 6.09* 5.50* 7.05* 4.71*  CALCIUM 7.0* 6.9*  6.7* 7.4*  MG  --   --  1.8 1.7  PHOS  --  3.1 3.5 2.6     Liver Function Tests: Recent Labs  Lab 01/30/22 0519  ALBUMIN 1.6*    No results for input(s): LIPASE, AMYLASE in the last 168 hours.  No results for input(s): AMMONIA in the last 168 hours.   CBC: Recent Labs  Lab 01/26/22 0525 01/30/22 0519 01/31/22 0537 02/01/22 0607  WBC 9.0 5.1 5.1 5.2  HGB 8.3* 8.6* 9.2* 9.6*  HCT 25.4* 26.8* 28.6* 29.2*  MCV 93.4 92.7 92.3 92.7  PLT 165 184 187 192     Cardiac Enzymes: No results for input(s): CKTOTAL, CKMB, CKMBINDEX, TROPONINI in the last 168 hours.  BNP: Invalid input(s): POCBNP  CBG: Recent Labs  Lab 01/28/22 0017 01/28/22 0442 01/28/22 0842 01/28/22 1645 01/28/22 2117  GLUCAP 102* 99 93 129* 10     Microbiology: Results for orders placed or performed during the hospital encounter of 01/21/22  Urine Culture     Status: Abnormal   Collection Time: 01/21/22 10:25 AM   Specimen: Urine, Random  Result Value Ref Range Status   Specimen Description   Final    URINE, RANDOM Performed at [facility], [address], [place], [place] [address]    Special Requests   Final    NONE Performed at [facility], [address] [place], [place] [address]    Culture (A)  Final    >=100,000 COLONIES/mL ESCHERICHIA COLI CORRECTED ON 02/10 AT 1430: PREVIOUSLY REPORTED AS  MULTIPLE SPECIES PRESENT, SUGGEST RECOLLECTION Confirmed Extended Spectrum Beta-Lactamase Producer (ESBL).  In bloodstream infections from ESBL organisms, carbapenems are preferred over piperacillin/tazobactam. They are shown to have a lower risk of mortality.    Report Status 01/28/2022 FINAL  Final   Organism ID, Bacteria ESCHERICHIA COLI (A)  Final      Susceptibility   Escherichia coli - MIC*    AMPICILLIN >=32 RESISTANT Resistant     CEFAZOLIN >=64 RESISTANT Resistant     CEFEPIME 16 RESISTANT Resistant     CEFTRIAXONE >=64 RESISTANT Resistant     CIPROFLOXACIN >=4  RESISTANT Resistant     GENTAMICIN >=16 RESISTANT Resistant     IMIPENEM <=0.25 SENSITIVE Sensitive     NITROFURANTOIN <=16 SENSITIVE Sensitive     TRIMETH/SULFA >=320 RESISTANT Resistant     AMPICILLIN/SULBACTAM >=32 RESISTANT Resistant     PIP/TAZO 8 SENSITIVE Sensitive     * >=100,000 COLONIES/mL ESCHERICHIA COLI CORRECTED ON 02/10 AT 1430: PREVIOUSLY REPORTED AS MULTIPLE SPECIES PRESENT, SUGGEST RECOLLECTION  Blood Culture (routine x 2)     Status: None   Collection Time: 01/21/22  1:12 PM   Specimen: BLOOD  Result Value Ref Range Status   Specimen Description BLOOD BLOOD RIGHT ARM  Final   Special Requests   Final    BOTTLES DRAWN AEROBIC AND ANAEROBIC Blood Culture adequate volume   Culture   Final    NO GROWTH 5 DAYS Performed at [facility], [address] [place], [place] [address]    Report Status 01/26/2022 FINAL  Final  Blood Culture (routine x 2)     Status: None   Collection Time: 01/21/22  1:12 PM   Specimen: BLOOD  Result Value Ref Range Status   Specimen Description BLOOD BLOOD RIGHT FOREARM  Final   Special Requests   Final    BOTTLES DRAWN AEROBIC AND ANAEROBIC Blood Culture adequate volume   Culture   Final    NO GROWTH 5 DAYS Performed at [facility], [address] [place], [place] [address]    Report Status 01/26/2022 FINAL  Final  Acid Fast Smear (AFB)     Status: None   Collection Time: 01/21/22  4:02 PM   Specimen: Chest; Body Fluid  Result Value Ref Range Status   AFB Specimen Processing Concentration  Final   Acid Fast Smear Negative  Final    Comment: (NOTE) Performed At: [facility] [place], [place] [id number] Rush Farmer MD CZ:[id number]    Source (AFB) NONE  Final    Comment: Performed at [facility], [address], [place], [place] [address]  Body fluid culture w Gram Stain     Status: None   Collection Time: 01/21/22  4:02 PM   Specimen: Chest; Body Fluid  Result Value Ref  Range Status   Specimen Description   Final    CHEST FLUID Performed at [facility], [place] [address], [place], [place] [address]    Special Requests   Final    NONE Performed at [facility], [address] [place], [place] [address]    Gram Stain   Final    NO WBC SEEN NO ORGANISMS SEEN Performed at [facility], [place] [address] [place],  [address]    Culture   Final    FEW ESCHERICHIA COLI Confirmed Extended Spectrum Beta-Lactamase Producer (ESBL).  In bloodstream infections from ESBL organisms, carbapenems are preferred over piperacillin/tazobactam. They are shown to  have a lower risk of mortality.    Report Status 01/25/2022 FINAL  Final   Organism ID, Bacteria ESCHERICHIA COLI  Final      Susceptibility   Escherichia coli - MIC*    AMPICILLIN >=32 RESISTANT Resistant     CEFAZOLIN >=64 RESISTANT Resistant     CEFEPIME 16 RESISTANT Resistant     CEFTAZIDIME RESISTANT Resistant     CEFTRIAXONE >=64 RESISTANT Resistant     CIPROFLOXACIN >=4 RESISTANT Resistant     GENTAMICIN >=16 RESISTANT Resistant     IMIPENEM <=0.25 SENSITIVE Sensitive     TRIMETH/SULFA >=320 RESISTANT Resistant     AMPICILLIN/SULBACTAM >=32 RESISTANT Resistant     PIP/TAZO 8 SENSITIVE Sensitive     * FEW ESCHERICHIA COLI  Aerobic/Anaerobic Culture w Gram Stain (surgical/deep wound)     Status: None   Collection Time: 01/22/22  3:56 PM   Specimen: Pleural Fluid  Result Value Ref Range Status   Specimen Description   Final    PLEURAL RIGHT Performed at [facility], [address] [place], [place] [address]    Special Requests   Final    PLE FLU Performed at [facility], [address] [place], [place] [address]    Gram Stain   Final    RARE WBC PRESENT, PREDOMINANTLY MONONUCLEAR NO ORGANISMS SEEN    Culture   Final    No growth aerobically or anaerobically. Performed at [facility], [place] [address], [place], [place] [address]     Report Status 01/28/2022 FINAL  Final  Resp Panel by RT-PCR (Flu A&B, Covid) Nasopharyngeal Swab     Status: None   Collection Time: 01/23/22  4:52 PM   Specimen: Nasopharyngeal Swab; Nasopharyngeal(NP) swabs in vial transport medium  Result Value Ref Range Status   SARS Coronavirus 2 by RT PCR NEGATIVE NEGATIVE Final    Comment: (NOTE) SARS-CoV-2 target nucleic acids are NOT DETECTED.  The SARS-CoV-2 RNA is generally detectable in upper respiratory specimens during the acute phase of infection. The lowest concentration of SARS-CoV-2 viral copies this assay can detect is 138 copies/mL. A negative result does not preclude SARS-Cov-2 infection and should not be used as the sole basis for treatment or other patient management decisions. A negative result may occur with  improper specimen collection/handling, submission of specimen other than nasopharyngeal swab, presence of viral mutation(s) within the areas targeted by this assay, and inadequate number of viral copies(<138 copies/mL). A negative result must be combined with clinical observations, patient history, and epidemiological information. The expected result is Negative.  Fact Sheet for Patients:  EntrepreneurPulse.com.au  Fact Sheet for Healthcare Providers:  IncredibleEmployment.be  This test is no t yet approved or cleared by the [place] FDA and  has been authorized for detection and/or diagnosis of SARS-CoV-2 by FDA under an Emergency Use Authorization (EUA). This EUA will remain  in effect (meaning this test can be used) for the duration of the COVID-19 declaration under Section 564(b)(1) of the Act, 21 U.S.C.section 360bbb-3(b)(1), unless the authorization is terminated  or revoked sooner.       Influenza A by PCR NEGATIVE NEGATIVE Final   Influenza B by PCR NEGATIVE NEGATIVE Final    Comment: (NOTE) The Xpert Xpress SARS-CoV-2/FLU/RSV plus assay is intended as an aid in  the diagnosis of influenza from Nasopharyngeal swab specimens and should not be used as a sole basis for treatment. Nasal washings and aspirates are unacceptable for Xpert Xpress SARS-CoV-2/FLU/RSV testing.  Fact Sheet for Patients: EntrepreneurPulse.com.au  Fact Sheet for Healthcare Providers: IncredibleEmployment.be  This test is not yet approved or cleared by the [place] FDA and has been authorized for detection and/or diagnosis of SARS-CoV-2 by FDA under an Emergency Use Authorization (EUA). This EUA will remain in effect (meaning this test can be used) for the duration of the COVID-19 declaration under Section 564(b)(1) of the Act, 21 U.S.C. section 360bbb-3(b)(1), unless the authorization is terminated or revoked.  Performed at [facility], [address] [place], [place] [address]   Aerobic/Anaerobic Culture w Gram Stain (surgical/deep wound)     Status: None   Collection Time: 01/25/22 12:00 PM   Specimen: Pleura  Result Value Ref Range Status   Specimen Description   Final    PLEURAL Performed at [facility], [address], [place], [place] [address]    Special Requests   Final    RIGHT CHEST TUBE Performed at [facility], [place], [place] [address]    Gram Stain NO WBC SEEN NO ORGANISMS SEEN   Final   Culture   Final    No growth aerobically or anaerobically. Performed at [facility], [place] [address] [place], [place] [address]    Report Status 01/30/2022 FINAL  Final  Acid Fast Smear (AFB)     Status: None   Collection Time: 01/25/22 12:05 PM   Specimen: Sputum  Result Value Ref Range Status   AFB Specimen Processing Concentration  Final   Acid Fast Smear Negative  Final    Comment: (NOTE) Performed At: [facility] [address] [place], [place] [id number] Rush Farmer MD RK:[id number]    Source (AFB) PLEURAL  Final    Comment: Performed at  [facility], [address] [place], [place] [address]  Acid Fast Smear (AFB)     Status: None   Collection Time: 01/26/22  4:00 PM   Specimen: Sputum  Result Value Ref Range Status   AFB Specimen Processing Concentration  Final   Acid Fast Smear Negative  Final    Comment: (NOTE) Performed At: [facility] [place], [place] [id number] Rush Farmer MD GY:[id number]    Source (AFB) EXPECTORATED SPUTUM  Final    Comment: Performed at [facility], [address] [place], [place] [address]  Acid Fast Smear (AFB)     Status: None   Collection Time: 01/27/22  2:51 PM   Specimen: Sputum  Result Value Ref Range Status   AFB Specimen Processing Concentration  Final   Acid Fast Smear Negative  Final    Comment: (NOTE) Performed At: [facility] [place], [place] [id number] Rush Farmer MD JI:[id number]    Source (AFB) SPU  Final    Comment: Performed at [facility], [address] [place], [place] [address]    Coagulation Studies: No results for input(s): LABPROT, INR in the last 72 hours.   Urinalysis: No results for input(s): COLORURINE, LABSPEC, PHURINE, GLUCOSEU, HGBUR, BILIRUBINUR, KETONESUR, PROTEINUR, UROBILINOGEN, NITRITE, LEUKOCYTESUR in the last 72 hours.  Invalid input(s): APPERANCEUR     Imaging: DG Chest Port 1 View  Result Date: 01/30/2022 CLINICAL DATA:  Placement of central venous catheter EXAM: PORTABLE CHEST 1 VIEW COMPARISON:  Previous studies including the examination of 01/29/2022 FINDINGS: There is interval placement of right IJ central venous catheter with its tip in the superior vena cava. There is no pneumothorax. Transverse diameter of heart is increased.  There is homogeneous opacity in the right mid and right lower lung fields. Linear densities seen in the left lower lung fields. No significant interval changes are noted. There is blunting of right lateral CP angle.  IMPRESSION: Tip of right IJ central venous catheter is seen in the superior vena cava. There is no pneumothorax. No significant interval changes are noted in the lung fields. Electronically Signed   By: Elmer Picker M.D.   On: 01/30/2022 15:42     Medications:    sodium chloride     ertapenem 500 mg (01/31/22 1714)    sodium chloride   Intravenous Once   allopurinol  50 mg Oral Daily   atorvastatin  20 mg Oral Daily   calcium carbonate  1 tablet Oral BID WC   Chlorhexidine Gluconate Cloth  6 each Topical Q0600   cholecalciferol  1,000 Units Oral Daily   epoetin (EPOGEN/PROCRIT) injection  10,000 Units Intravenous Q T,Th,Sa-HD   famotidine  20 mg Oral Daily   ferrous sulfate  325 mg Oral BID WC   heparin injection (subcutaneous)  5,000 Units Subcutaneous Q8H   hydrALAZINE  50 mg Oral TID   lactulose  20 g Oral TID   metoprolol succinate  25 mg Oral Daily   senna-docusate  2 tablet Oral QHS   sodium chloride flush  3 mL Intravenous Q12H   tamsulosin  0.4 mg Oral QPC supper     Assessment/ Plan:  Mr. Samuel Mcpeek is a 81 y.o. Hispanic male who speaks [place] and limited [place] and [place].  Patient with end stage renal disease on hemodialysis, hypertension, BPH, diabetes mellitus type II, hyperlipidemia who presents to [facility] on 01/21/2022 for Urinary retention [R33.9] Back pain [M54.9] UTI (urinary tract infection) [N39.0] Pleural effusion [J90] Pleural effusion on right [J90] Acute cystitis with hematuria [N30.01] Chest pain, unspecified type [R07.9] Sepsis, due to unspecified organism, unspecified whether acute organ dysfunction present ([place]) [A41.9] Acute cough [R05.1]   [facility] TTS [facility] Left AVF 73kg.    End stage renal disease: Continue TTS schedule.  Next treatment scheduled for Saturday Dialysis coordinator aware of patient and awaiting discharge planning to determine outpatient needs.   Anemia of chronic kidney disease: Hgb 9.6 - EPO with  dialysis treatments   Hypertension: .Home regimen includes hydralazine, metoprolol and tamsulosin.  BP 127/46, stable for this patient   Secondary Hyperparathyroidism: Continue cholecalciferol daily.   -Corrected calcium of 9.3 with calcium 3 bath during dialysis. Phosphorus at goal. Continue calcium carbonate.  5.  Right pleural effusion likely due to rib fracture.  Posttraumatic hemorrhagic pleural effusion.  Right chest tube placed on 01/22/2022 and removed on 01/28/22.  Testing sent on drainage.  Found to have e coli. Pulmonology and ID  following.  -Receiving Meropenem.  - PICC placed for continued antibiotic therapy after discharge. Ertapenem 500mg  IV daily for 6 weeks    LOS: 11   2/17/20232:01 PM

## 2022-02-01 NOTE — Progress Notes (Signed)
Occupational Therapy Treatment Patient Details Name: Martin Gilbert MRN: [id number] DOB: 1941-11-27 Today's Date: 02/01/2022   History of present illness 81 y/o male admitted for sepsis secondary to UTI with retention. History of ESRD on HD, HTN, HLD, Cdiff, and GERD. Pt needed chest tube that was removed 2/14.   OT comments  Pt seen for OT tx this date to f/u re: safety with ADLs/ADL mobility. Pt agreeable to getting to [facility] with encouragement. He requires MAX A with close standby from his granddaughter for cueing. While MAX A with RW is sufficiant to come to standing, pt becomes fearful and starts leaning back. He expresses to this therapist that he dosen't feel that a woman is strong enough to get him up. He is able to pivot to [facility], but essentially goes from MAX To TOTAL A to safely get there d/t initiating a severe posterior lean at the halfway point both there and back. He requires MAX A for posterior peri care after mostly liquid BM. He has two family members present for entirety of session. Returned to bed with all needs met and in reach. Increased time required to mobilize and education completed with family members end of session. Will continue to follow.    Recommendations for follow up therapy are one component of a multi-disciplinary discharge planning process, led by the attending physician.  Recommendations may be updated based on patient status, additional functional criteria and insurance authorization.    Follow Up Recommendations  Skilled nursing-short term rehab (<3 hours/day)    Assistance Recommended at Discharge Frequent or constant Supervision/Assistance  Patient can return home with the following  Two people to help with bathing/dressing/bathroom;A little help with bathing/dressing/bathroom;Assistance with cooking/housework;Help with stairs or ramp for entrance;Assistance with feeding;Assist for transportation;Direct supervision/assist for medications management    Equipment Recommendations  BSC/3in1    Recommendations for Other Services      Precautions / Restrictions Precautions Precautions: Fall Precaution Comments: foley catheter Restrictions Weight Bearing Restrictions: No       Mobility Bed Mobility Overal bed mobility: Needs Assistance Bed Mobility: Supine to Sit, Sit to Supine     Supine to sit: Min assist Sit to supine: Supervision   General bed mobility comments: MIN A for trunk to come to sitting, able to manage LEs back to bed. He is hesitant to participate, but family encouragement helps. In addition, he is hoping to have BM so he is agreeable to commode transfer    Transfers Overall transfer level: Needs assistance Equipment used: Rolling walker (2 wheels) Transfers: Bed to chair/wheelchair/BSC   Stand pivot transfers: Max assist         General transfer comment: While MAX A is sufficiant to come to standing, pt becomes fearful and starts leaning back. He expresses to this therapist that he dosen't feel that a woman is strong enough to get him up. He is able to pivot to [facility], but essentially goes from MAX To TOTAL A to safely get there d/t initiating a severe posterior lean at the halfway point both there and back.     Balance Overall balance assessment: Needs assistance Sitting-balance support: Feet supported Sitting balance-Leahy Scale: Good     Standing balance support: Bilateral upper extremity supported Standing balance-Leahy Scale: Poor                             ADL either performed or assessed with clinical judgement   ADL  Extremity/Trunk Assessment Upper Extremity Assessment Upper Extremity Assessment: Generalized weakness   Lower Extremity Assessment Lower Extremity Assessment: Generalized weakness        Vision Patient Visual Report: No change from baseline     Perception     Praxis      Cognition  Arousal/Alertness: Awake/alert Behavior During Therapy: Flat affect Overall Cognitive Status: Within Functional Limits for tasks assessed                                 General Comments: Requires max education/encouragement for participation & OOB mobility. Also requires reassurance to attempt mobilizing with a male, relayed this to PT to switch to a more appropraite therapist.        Exercises Other Exercises Other Exercises: OT engages pt in commode transfer and toileting tasks    Shoulder Instructions       General Comments      Pertinent Vitals/ Pain       Pain Assessment Pain Assessment: Faces Faces Pain Scale: Hurts little more Pain Location: foley Pain Descriptors / Indicators: Discomfort, Aching Pain Intervention(s): Limited activity within patient's tolerance, Monitored during session  [place] Access:  (reports stair enterance and ramp enterance)                                Prior Functioning/Environment              Frequency  Min 2X/week        Progress Toward Goals  OT Goals(current goals can now be found in the care plan section)  Progress towards OT goals: Progressing toward goals  Acute Rehab OT Goals Patient Stated Goal: have less pain and go home with family OT Goal Formulation: With patient/family Time For Goal Achievement: 02/08/22 Potential to Achieve Goals: Good  Plan      Co-evaluation                 AM-PAC OT "6 Clicks" Daily Activity     Outcome Measure   Help from another person eating meals?: A Little Help from another person taking care of personal grooming?: A Little Help from another person toileting, which includes using toliet, bedpan, or urinal?: A Lot Help from another person bathing (including washing, rinsing, drying)?: A Lot Help from another person to put on and taking off regular upper body clothing?: A Little Help from another person to put on and  taking off regular lower body clothing?: A Lot 6 Click Score: 15    End of Session Equipment Utilized During Treatment: Gait belt;Rolling walker (2 wheels)  OT Visit Diagnosis: Other abnormalities of gait and mobility (R26.89);Repeated falls (R29.6);Muscle weakness (generalized) (M62.81);Pain Pain - Right/Left: Right Pain - part of body: Ankle and joints of foot   Activity Tolerance Patient tolerated treatment well   Patient Left with call bell/phone within reach;with family/visitor present;in bed;with bed alarm set   Nurse Communication Mobility status        Time: 7619-5093 OT Time Calculation (min): 27 min  Charges: OT General Charges $OT Visit: 1 Visit OT Treatments $Self Care/Home Management : 8-22 mins $Therapeutic Activity: 8-22 mins  Gerrianne Scale, MS, OTR/L ascom [phone] 02/01/22, 3:52 PM

## 2022-02-02 DIAGNOSIS — N410 Acute prostatitis: Secondary | ICD-10-CM | POA: Diagnosis not present

## 2022-02-02 LAB — BASIC METABOLIC PANEL
Anion gap: 10 (ref 5–15)
BUN: 23 mg/dL (ref 8–23)
CO2: 29 mmol/L (ref 22–32)
Calcium: 7.6 mg/dL — ABNORMAL LOW (ref 8.9–10.3)
Chloride: 97 mmol/L — ABNORMAL LOW (ref 98–111)
Creatinine, Ser: 6.03 mg/dL — ABNORMAL HIGH (ref 0.61–1.24)
GFR, Estimated: 9 mL/min — ABNORMAL LOW (ref >60)
Glucose, Bld: 101 mg/dL — ABNORMAL HIGH (ref 70–99)
Potassium: 4.2 mmol/L (ref 3.5–5.1)
Sodium: 136 mmol/L (ref 135–145)

## 2022-02-02 LAB — CBC
HCT: 31.1 % — ABNORMAL LOW (ref 39.0–52.0)
Hemoglobin: 10 g/dL — ABNORMAL LOW (ref 13.0–17.0)
MCH: 29.7 pg (ref 26.0–34.0)
MCHC: 32.2 g/dL (ref 30.0–36.0)
MCV: 92.3 fL (ref 80.0–100.0)
Platelets: 242 10*3/uL (ref 150–400)
RBC: 3.37 MIL/uL — ABNORMAL LOW (ref 4.22–5.81)
RDW: 17 % — ABNORMAL HIGH (ref 11.5–15.5)
WBC: 6.1 10*3/uL (ref 4.0–10.5)
nRBC: 0 % (ref 0.0–0.2)

## 2022-02-02 LAB — MAGNESIUM: Magnesium: 1.8 mg/dL (ref 1.7–2.4)

## 2022-02-02 LAB — PHOSPHORUS: Phosphorus: 3 mg/dL (ref 2.5–4.6)

## 2022-02-02 MED ORDER — LACTULOSE 10 GM/15ML PO SOLN
20.0000 g | Freq: Two times a day (BID) | ORAL | Status: DC
  Filled 2022-02-02 (×3): qty 30

## 2022-02-02 MED ORDER — EPOETIN ALFA 10000 UNIT/ML IJ SOLN
INTRAMUSCULAR | Status: AC
  Filled 2022-02-02: qty 1

## 2022-02-02 MED ORDER — QUETIAPINE FUMARATE 25 MG PO TABS
12.5000 mg | ORAL_TABLET | Freq: Two times a day (BID) | ORAL | Status: DC
  Administered 2022-02-02 – 2022-02-03 (×2): 12.5 mg via ORAL
  Filled 2022-02-02 (×3): qty 1
  Filled 2022-02-02: qty 0.5
  Filled 2022-02-02: qty 1

## 2022-02-02 NOTE — Progress Notes (Signed)
[facility]  ROUNDING NOTE   Subjective:   [name] was admitted to [facility] on 01/21/2022 for Urinary retention [R33.9] Back pain [M54.9] UTI (urinary tract infection) [N39.0] Pleural effusion [J90] Pleural effusion on right [J90] Acute cystitis with hematuria [N30.01] Chest pain, unspecified type [R07.9] Sepsis, due to unspecified organism, unspecified whether acute organ dysfunction present ([place]) [A41.9] Acute cough [R05.1]  Patient was seen and examined.  Requiring Spanish interpreter for communication.  Patient still noted to have intermittent confusion.  Scheduled to have dialysis today.   Objective:  Vital signs in last 24 hours:  Temp:  [97.8 F (36.6 C)-98.6 F (37 C)] 97.8 F (36.6 C) (02/18 1109) Pulse Rate:  [70-89] 74 (02/18 1430) Resp:  [16-27] 17 (02/18 1430) BP: (103-154)/(37-76) 122/43 (02/18 1430) SpO2:  [96 %-100 %] 100 % (02/18 0937) Weight:  [72.6 kg-73.3 kg] 73.3 kg (02/18 1109)  Weight change: -0.8 kg Filed Weights   02/01/22 0500 02/02/22 0500 02/02/22 1109  Weight: 54.3 kg 72.6 kg 73.3 kg    Intake/Output: No intake/output data recorded.   Intake/Output this shift:  No intake/output data recorded.  Physical Exam: General: NAD, resting in bed  Head: Normocephalic, atraumatic. Moist oral mucosal membranes  Eyes: Anicteric  Lungs:  Diminished in bases, normal effort  Heart: Regular rate and rhythm  Abdomen:  Soft, nontender  Extremities: 1+ peripheral edema.  Neurologic: Nonfocal, moving all four extremities  Skin: No lesions   Access: Left AVF  GU Foley-placed by Urology  Basic Metabolic Panel: Recent Labs  Lab 01/30/22 0519 01/31/22 0537 02/01/22 0607 02/02/22 0320  NA 136 137 136 136  K 3.1* 3.2* 3.4* 4.2  CL 98 101 96* 97*  CO2 28 27 31 29   GLUCOSE 102* 96 103* 101*  BUN 22 28* 16 23  CREATININE 5.50* 7.05* 4.71* 6.03*  CALCIUM 6.9* 6.7* 7.4* 7.6*  MG  --  1.8 1.7 1.8  PHOS 3.1 3.5 2.6 3.0      Liver Function Tests: Recent Labs  Lab 01/30/22 0519  ALBUMIN 1.6*    No results for input(s): LIPASE, AMYLASE in the last 168 hours.  No results for input(s): AMMONIA in the last 168 hours.   CBC: Recent Labs  Lab 01/30/22 0519 01/31/22 0537 02/01/22 0607 02/02/22 0320  WBC 5.1 5.1 5.2 6.1  HGB 8.6* 9.2* 9.6* 10.0*  HCT 26.8* 28.6* 29.2* 31.1*  MCV 92.7 92.3 92.7 92.3  PLT 184 187 192 242     Cardiac Enzymes: No results for input(s): CKTOTAL, CKMB, CKMBINDEX, TROPONINI in the last 168 hours.  BNP: Invalid input(s): POCBNP  CBG: Recent Labs  Lab 01/28/22 0017 01/28/22 0442 01/28/22 0842 01/28/22 1645 01/28/22 2117  GLUCAP 102* 99 93 129* 63     Microbiology: Results for orders placed or performed during the hospital encounter of 01/21/22  Urine Culture     Status: Abnormal   Collection Time: 01/21/22 10:25 AM   Specimen: Urine, Random  Result Value Ref Range Status   Specimen Description   Final    URINE, RANDOM Performed at [facility], [address], [place], [place] [address]    Special Requests   Final    NONE Performed at [facility], [address] [place], [place] [address]    Culture (A)  Final    >=100,000 COLONIES/mL ESCHERICHIA COLI CORRECTED ON 02/10 AT 1430: PREVIOUSLY REPORTED AS MULTIPLE SPECIES PRESENT, SUGGEST RECOLLECTION Confirmed Extended Spectrum Beta-Lactamase Producer (ESBL).  In bloodstream infections  from ESBL organisms, carbapenems are preferred over piperacillin/tazobactam. They are shown to have a lower risk of mortality.    Report Status 01/28/2022 FINAL  Final   Organism ID, Bacteria ESCHERICHIA COLI (A)  Final      Susceptibility   Escherichia coli - MIC*    AMPICILLIN >=32 RESISTANT Resistant     CEFAZOLIN >=64 RESISTANT Resistant     CEFEPIME 16 RESISTANT Resistant     CEFTRIAXONE >=64 RESISTANT Resistant     CIPROFLOXACIN >=4 RESISTANT Resistant     GENTAMICIN >=16 RESISTANT  Resistant     IMIPENEM <=0.25 SENSITIVE Sensitive     NITROFURANTOIN <=16 SENSITIVE Sensitive     TRIMETH/SULFA >=320 RESISTANT Resistant     AMPICILLIN/SULBACTAM >=32 RESISTANT Resistant     PIP/TAZO 8 SENSITIVE Sensitive     * >=100,000 COLONIES/mL ESCHERICHIA COLI CORRECTED ON 02/10 AT 1430: PREVIOUSLY REPORTED AS MULTIPLE SPECIES PRESENT, SUGGEST RECOLLECTION  Blood Culture (routine x 2)     Status: None   Collection Time: 01/21/22  1:12 PM   Specimen: BLOOD  Result Value Ref Range Status   Specimen Description BLOOD BLOOD RIGHT ARM  Final   Special Requests   Final    BOTTLES DRAWN AEROBIC AND ANAEROBIC Blood Culture adequate volume   Culture   Final    NO GROWTH 5 DAYS Performed at [facility], [address] [place], [place] [address]    Report Status 01/26/2022 FINAL  Final  Blood Culture (routine x 2)     Status: None   Collection Time: 01/21/22  1:12 PM   Specimen: BLOOD  Result Value Ref Range Status   Specimen Description BLOOD BLOOD RIGHT FOREARM  Final   Special Requests   Final    BOTTLES DRAWN AEROBIC AND ANAEROBIC Blood Culture adequate volume   Culture   Final    NO GROWTH 5 DAYS Performed at [facility], [address] [place], [place] [address]    Report Status 01/26/2022 FINAL  Final  Acid Fast Smear (AFB)     Status: None   Collection Time: 01/21/22  4:02 PM   Specimen: Chest; Body Fluid  Result Value Ref Range Status   AFB Specimen Processing Concentration  Final   Acid Fast Smear Negative  Final    Comment: (NOTE) Performed At: [facility] [place], [place] [id number] Rush Farmer MD FU:[id number]    Source (AFB) NONE  Final    Comment: Performed at [facility], [address], [place], [place] [address]  Body fluid culture w Gram Stain     Status: None   Collection Time: 01/21/22  4:02 PM   Specimen: Chest; Body Fluid  Result Value Ref Range Status   Specimen Description   Final    CHEST  FLUID Performed at [facility], [place] [address] [place], [place] [address]    Special Requests   Final    NONE Performed at [facility], [address] [place], [place] [address]    Gram Stain   Final    NO WBC SEEN NO ORGANISMS SEEN Performed at [facility], [place] [address] [place], [place] [address]    Culture   Final    FEW ESCHERICHIA COLI Confirmed Extended Spectrum Beta-Lactamase Producer (ESBL).  In bloodstream infections from ESBL organisms, carbapenems are preferred over piperacillin/tazobactam. They are shown to have a lower risk of mortality.    Report Status 01/25/2022 FINAL  Final  Organism ID, Bacteria ESCHERICHIA COLI  Final      Susceptibility   Escherichia coli - MIC*    AMPICILLIN >=32 RESISTANT Resistant     CEFAZOLIN >=64 RESISTANT Resistant     CEFEPIME 16 RESISTANT Resistant     CEFTAZIDIME RESISTANT Resistant     CEFTRIAXONE >=64 RESISTANT Resistant     CIPROFLOXACIN >=4 RESISTANT Resistant     GENTAMICIN >=16 RESISTANT Resistant     IMIPENEM <=0.25 SENSITIVE Sensitive     TRIMETH/SULFA >=320 RESISTANT Resistant     AMPICILLIN/SULBACTAM >=32 RESISTANT Resistant     PIP/TAZO 8 SENSITIVE Sensitive     * FEW ESCHERICHIA COLI  Aerobic/Anaerobic Culture w Gram Stain (surgical/Martin wound)     Status: None   Collection Time: 01/22/22  3:56 PM   Specimen: Pleural Fluid  Result Value Ref Range Status   Specimen Description   Final    PLEURAL RIGHT Performed at [facility], [address], [place], [place] [address]    Special Requests   Final    PLE FLU Performed at [facility], [address] [place], [place] [address]    Gram Stain   Final    RARE WBC PRESENT, PREDOMINANTLY MONONUCLEAR NO ORGANISMS SEEN    Culture   Final    No growth aerobically or anaerobically. Performed at [facility], [place] [address], [place], [place] [address]    Report Status 01/28/2022 FINAL  Final  Resp Panel by  RT-PCR (Flu A&B, Covid) Nasopharyngeal Swab     Status: None   Collection Time: 01/23/22  4:52 PM   Specimen: Nasopharyngeal Swab; Nasopharyngeal(NP) swabs in vial transport medium  Result Value Ref Range Status   SARS Coronavirus 2 by RT PCR NEGATIVE NEGATIVE Final    Comment: (NOTE) SARS-CoV-2 target nucleic acids are NOT DETECTED.  The SARS-CoV-2 RNA is generally detectable in upper respiratory specimens during the acute phase of infection. The lowest concentration of SARS-CoV-2 viral copies this assay can detect is 138 copies/mL. A negative result does not preclude SARS-Cov-2 infection and should not be used as the sole basis for treatment or other patient management decisions. A negative result may occur with  improper specimen collection/handling, submission of specimen other than nasopharyngeal swab, presence of viral mutation(s) within the areas targeted by this assay, and inadequate number of viral copies(<138 copies/mL). A negative result must be combined with clinical observations, patient history, and epidemiological information. The expected result is Negative.  Fact Sheet for Patients:  EntrepreneurPulse.com.au  Fact Sheet for Healthcare Providers:  IncredibleEmployment.be  This test is no t yet approved or cleared by the [place] FDA and  has been authorized for detection and/or diagnosis of SARS-CoV-2 by FDA under an Emergency Use Authorization (EUA). This EUA will remain  in effect (meaning this test can be used) for the duration of the COVID-19 declaration under Section 564(b)(1) of the Act, 21 U.S.C.section 360bbb-3(b)(1), unless the authorization is terminated  or revoked sooner.       Influenza A by PCR NEGATIVE NEGATIVE Final   Influenza B by PCR NEGATIVE NEGATIVE Final    Comment: (NOTE) The Xpert Xpress SARS-CoV-2/FLU/RSV plus assay is intended as an aid in the diagnosis of influenza from Nasopharyngeal swab  specimens and should not be used as a sole basis for treatment. Nasal washings and aspirates are unacceptable for Xpert Xpress SARS-CoV-2/FLU/RSV testing.  Fact Sheet for Patients: EntrepreneurPulse.com.au  Fact Sheet for Healthcare Providers: IncredibleEmployment.be  This test is not  yet approved or cleared by the [place] and has been authorized for detection and/or diagnosis of SARS-CoV-2 by FDA under an Emergency Use Authorization (EUA). This EUA will remain in effect (meaning this test can be used) for the duration of the COVID-19 declaration under Section 564(b)(1) of the Act, 21 U.S.C. section 360bbb-3(b)(1), unless the authorization is terminated or revoked.  Performed at [facility], [address] [place], [place] [address]   Aerobic/Anaerobic Culture w Gram Stain (surgical/Martin wound)     Status: None   Collection Time: 01/25/22 12:00 PM   Specimen: Pleura  Result Value Ref Range Status   Specimen Description   Final    PLEURAL Performed at [facility], [address], [place], [place] [address]    Special Requests   Final    RIGHT CHEST TUBE Performed at [facility], [place], [place] [address]    Gram Stain NO WBC SEEN NO ORGANISMS SEEN   Final   Culture   Final    No growth aerobically or anaerobically. Performed at [facility], [place] [address] [place] [address], [place], [place] [address]    Report Status 01/30/2022 FINAL  Final  Acid Fast Smear (AFB)     Status: None   Collection Time: 01/25/22 12:05 PM   Specimen: Sputum  Result Value Ref Range Status   AFB Specimen Processing Concentration  Final   Acid Fast Smear Negative  Final    Comment: (NOTE) Performed At: [facility] [address] [place], [place] [id number] Rush Farmer MD BP:[id number]    Source (AFB) PLEURAL  Final    Comment: Performed at [facility], [address]  [place], [place] [address]  Acid Fast Smear (AFB)     Status: None   Collection Time: 01/26/22  4:00 PM   Specimen: Sputum  Result Value Ref Range Status   AFB Specimen Processing Concentration  Final   Acid Fast Smear Negative  Final    Comment: (NOTE) Performed At: [facility] [place], [place] [id number] Rush Farmer MD MG:[id number]    Source (AFB) EXPECTORATED SPUTUM  Final    Comment: Performed at [facility], [address] [place], [place] [address]  Acid Fast Smear (AFB)     Status: None   Collection Time: 01/27/22  2:51 PM   Specimen: Sputum  Result Value Ref Range Status   AFB Specimen Processing Concentration  Final   Acid Fast Smear Negative  Final    Comment: (NOTE) Performed At: [facility] [place], [place] [id number] Rush Farmer MD AS:[id number]    Source (AFB) SPU  Final    Comment: Performed at [facility], [address] [place], [place] [address]    Coagulation Studies: No results for input(s): LABPROT, INR in the last 72 hours.   Urinalysis: No results for input(s): COLORURINE, LABSPEC, PHURINE, GLUCOSEU, HGBUR, BILIRUBINUR, KETONESUR, PROTEINUR, UROBILINOGEN, NITRITE, LEUKOCYTESUR in the last 72 hours.  Invalid input(s): APPERANCEUR     Imaging: No results found.   Medications:    sodium chloride     ertapenem 500 mg (02/01/22 1729)    allopurinol  50 mg Oral Daily   atorvastatin  20 mg Oral Daily   calcium carbonate  1 tablet Oral BID WC   Chlorhexidine Gluconate Cloth  6 each Topical Q0600   cholecalciferol  1,000 Units Oral Daily   epoetin alfa       epoetin (EPOGEN/PROCRIT)  injection  10,000 Units Intravenous Q T,Th,Sa-HD   famotidine  20 mg Oral Daily   ferrous sulfate  325 mg Oral BID WC   heparin injection (subcutaneous)  5,000 Units Subcutaneous Q8H   hydrALAZINE  50 mg Oral TID   [START ON 02/03/2022] lactulose  20 g Oral BID   metoprolol succinate  25 mg  Oral Daily   QUEtiapine  12.5 mg Oral BID   senna-docusate  2 tablet Oral QHS   sodium chloride flush  3 mL Intravenous Q12H   tamsulosin  0.4 mg Oral QPC supper     Assessment/ Plan:  [name] is a 81 y.o. Hispanic male who speaks [place] and limited [place] and [place].  Patient with end stage renal disease on hemodialysis, hypertension, BPH, diabetes mellitus type II, hyperlipidemia who presents to [facility] on 01/21/2022 for Urinary retention [R33.9] Back pain [M54.9] UTI (urinary tract infection) [N39.0] Pleural effusion [J90] Pleural effusion on right [J90] Acute cystitis with hematuria [N30.01] Chest pain, unspecified type [R07.9] Sepsis, due to unspecified organism, unspecified whether acute organ dysfunction present ([place]) [A41.9] Acute cough [R05.1]   [facility] T[facility] Left AVF 73kg.    End stage renal disease: Patient is scheduled to have dialysis today. On TTS schedule. Dialysis coordinator aware of patient and awaiting discharge planning to determine outpatient needs.   Anemia of chronic kidney disease: Hgb 10.0 today.  EPO with dialysis treatments   Hypertension: .Home regimen includes hydralazine, metoprolol and tamsulosin. BP stable.    Secondary Hyperparathyroidism: Continue cholecalciferol daily. Phosphorus within normal range.   5.  Right pleural effusion likely due to rib fracture.  Posttraumatic hemorrhagic pleural effusion.  S/ P Right chest tube. Specimen positive for e coli. Pulmonology and ID  following. PICC placed for continued antibiotic therapy after discharge. Ertapenem 500mg  IV daily for 6 weeks    LOS: 12 Martin Gilbert 2/18/20232:44 PM

## 2022-02-02 NOTE — Plan of Care (Signed)
  Problem: Clinical Measurements: Goal: Diagnostic test results will improve Outcome: Progressing   

## 2022-02-02 NOTE — TOC Progression Note (Addendum)
Transition of Care [facility]) - Progression Note    Patient Details  Name: Martin Gilbert MRN: [id number] Date of Birth: [birth year]  [facility] CM/SW Contact  Izola Price, RN Phone Number: 02/02/2022, 12:34 PM  Clinical Narrative: 2/18: RN CM Spoke with Carolynn Sayers, after proivder indicated daughter concerns about antibiotic costs, caring for at home due to new confusion, per daughter. Pam reached out and spoke to daughter again. Can change [place] agencies to [facility] per Corene Cornea, which will cover more costs and can take patient if receives last dose before DC on Sunday and start of service on Monday, 02/04/22. Daughter also waiting to speak to provider re: confusion/concerns about going home now. SNF's that accepted were too far away for family to help/visit.  Will monitor for discharge plan. Updated provider with this information. Simmie Davies RN CM  120 pm: DC now pending tomorrow to see if confusion clears per provider. Will address discharge plan with HH at that time. Updated Carolynn Sayers and Floydene Flock at [facility]. Pam has also updated [facility] on situation. Simmie Davies RN CM     Expected Discharge Plan: [place] (vs home health) Barriers to Discharge: Continued Medical Work up  Expected Discharge Plan and Services Expected Discharge Plan: [place] (vs home health)     Post Acute Care Choice:  (TBD) Living arrangements for the past 2 months: Single Family Home                                       Social Determinants of Health (SDOH) Interventions    Readmission Risk Interventions Readmission Risk Prevention Plan 04/16/2021  Transportation Screening Complete  Home Care Screening Complete  Medication Review (RN CM) Complete  Some recent data might be hidden

## 2022-02-02 NOTE — Progress Notes (Signed)
Progress Note   Patient: Martin Gilbert [id number] DOB: January 01, 1941 DOA: 01/21/2022     12 DOS: the patient was seen and examined on 02/02/2022   Brief hospital course: [name] is an 81 y.o. M with ESRD on HD, HTN, DM, dCHF who presented with abdominal pain for 3 weeks.  In the ER, urinalysis (still makes significant urine) showed UTI.  Imaging showed low-density area near prostate concerning ofr prostatitis vs abscess.  Urology consulted and admitted on antibiotics.  Also noted to have loculated RIGHT pleural effusion.   2/6: Admitted on antibiotics, had diagnostic thoracentesis 2/7: CT surgery recommended pigtail catheter, placed 2nd day 2/8: Developed delirium 2/10: Chest fluid culture growing ESBL E coli 2/11: Delirium completely resolved 2/12: Family have elected not to do tPA in pleural catheter 2/14: Chest tube removed  Assessment and Plan: * Acute prostatitis Patient presented with suprapubic pain, difficulty urinating.  CAT scan showed a small lucency, possible prostatitis versus prostatic abscess.  Urology were consulted, recommended medical treatment and outpatient follow-up.  Sepsis was ruled out.  Urine culture with ESBL - Continue meropenem for 6 weeks, 42 days, with end date 3/23  - Ertapenem 500 IV daily via IJ PICC line  - Weekly CBC/d and CMP while on Ertapenem - Consult ID, appreciate cares  - Continue Flomax - Maintain foley at discharge --> Needs Urology follow up with Dr. Bernardo Gilbert      Gout -Continue allopurinol  Pressure injury of right buttock, stage 2 ([place])- (present on admission) - Mepilex dressing  Acute respiratory failure with hypoxia ([place]) At baseline the patient does not use oxygen.  Here he had hypoxia and dyspnea and he required up to 5 and 6 L of oxygen at times due to effusion, now weaned to room air.     Elevated troponin Ischemia ruled out.  Due to poor clearance, non-infarction troponin elevation.  Urinary retention -  See above  Pleural effusion [name] of this seems to be traumatic hemothorax, now organized.  Chest tube was placed without resolution of the effusion.  Thoracentesis culture grew ESBL, ?contaminant.  Later culture from chest tube placement showed no growth.  Discussed the nonresolving effusion with CT surgery, not surgical candidate.  Shared decision making regarding using lytics to reduce the size of the effusion was discussed with family, but ultimately it was elected to remove chest tube, do-nothing.  Given initial uncertainty about etiology of effusion and ?tuberculosis, Quantiferon gold was obtained --> this showed evidence of prior exposure.    We ruled out TB with 3 acid-fast smears negative, Low clinical suspicion for TB as well. ID following  Chronic diastolic CHF (congestive heart failure) ([place])- (present on admission) Acute CHF ruled out. - Minimize fluids   Hepatic cirrhosis ([place])- (present on admission) - Continue Lactulose, patient had very loose stools/diarrhea, skipped a dose of lactulose on 2/18 - Continue midodrine - Continue famotidine  Diabetes mellitus, type II ([name]) Glucose controlled adequately off insulin -Continue statin  Acute metabolic encephalopathy- (present on admission) Earlier in hospital stay, developed severe confusion, disorientation.  Likely due to infection, hypoxia, possibly medication side effects.  Antibiotics were broadened, psychotropics were minimized, and this resolved.  Continues to have some improving waxing and waning delirium, very mild, mostly at night. 2/18 started Seroquel 12.5 mg p.o. twice daily for acute delirium  ESRD (end stage renal disease) ([place])- (present on admission) - Consult Nephrology for maintenance HD, appreciate cares Right IJ catheter was pulled, chest x-ray was done to confirm position  2/15 Hypokalemia potassium 3.1, potassium 40 mEq x 1 dose given potassium 3.2---3.4 nephro following Hypocalcemia, calcium  repleted orally, vitamin D level 34   Anemia of chronic disease- (present on admission) -Continue iron, EPO  UTI (urinary tract infection)- (present on admission) - See above  Primary hypertension Patient had low blood pressure so patient was on midodrine 2/15 blood pressure seems to be high, discontinued midodrine - Resumed home metoprolol and hydralazine with holding parameters -We will continue monitor BP and titrate medications accordingly  Sepsis ([name])-resolved as of 01/24/2022, (present on admission) Due to complicated UTI with acute urinary retention, possibly prostatitis.      Body mass index is 27.81 kg/m.   Active Pressure Injury/Wound(s)     Pressure Ulcer  Duration          Pressure Injury 01/21/22 Buttocks Right Stage 2 -  Partial thickness loss of dermis presenting as a shallow open injury with a red, pink wound bed without slough. 8 days            Subjective: No significant overnight events, patient was lying comfortably in the bed, denied any active issues, no pain.   Physical Exam: Vitals:   02/02/22 1330 02/02/22 1345 02/02/22 1400 02/02/22 1415  BP: (!) 115/50 111/70 (!) 124/57 (!) 149/69  Pulse: 81 77 79 82  Resp: (!) 27 (!) 23 (!) 23 (!) 26  Temp:      TempSrc:      SpO2:      Weight:      Height:         Physical Exam: General:  alert, NAD, lying comfortably in the bed.  Appear in no distress, affect appropriate Eyes: PERRLA ENT: Oral Mucosa Clear, moist  Neck: No JVD,  Cardiovascular: S1 and S2 Present, no Murmur,  Respiratory: good respiratory effort, Bilateral Air entry equal and Decreased, no Crackles, no wheezes Abdomen: Bowel Sound present, Soft and no tenderness,  Skin: no rashes Extremities: no Pedal edema, no calf tenderness Neurologic: without any new focal findings Gait not checked due to patient safety concerns   Data Reviewed:  Labs reviewed and analyzed  Potassium 3.1---3.2 will be corrected by hemodialysis as  per nephro   Family Communication: Discussed with family at bedside 2/15   Disposition: Status is: Inpatient  Remains inpatient appropriate because: Sputum AFB negative x3, Airborne isolation has been discontinued.   2/18 patient is having intermittent confusion, unable to discharge home today Family cannot afford copayment of antibiotics, so home health agency will be changed who will take over on Monday, patient needs to stay to get antibiotics tomorrow a.m. dose     Planned Discharge Destination: Skilled nursing facility refused by family as it is far away to visit/help.  Plan is to discharge home with home health set up most likely tomorrow if patient remains stable.     Time spent: 35 minutes  Author: Val Riles, MD 02/02/2022 2:38 PM  For on call review [email or web address].

## 2022-02-02 NOTE — Plan of Care (Signed)
  Problem: Skin Integrity: Goal: Risk for impaired skin integrity will decrease Outcome: Progressing   

## 2022-02-03 DIAGNOSIS — N410 Acute prostatitis: Secondary | ICD-10-CM | POA: Diagnosis not present

## 2022-02-03 LAB — AMMONIA: Ammonia: 16 umol/L (ref 9–35)

## 2022-02-03 LAB — BASIC METABOLIC PANEL
Anion gap: 9 (ref 5–15)
BUN: 14 mg/dL (ref 8–23)
CO2: 29 mmol/L (ref 22–32)
Calcium: 7.5 mg/dL — ABNORMAL LOW (ref 8.9–10.3)
Chloride: 96 mmol/L — ABNORMAL LOW (ref 98–111)
Creatinine, Ser: 4.35 mg/dL — ABNORMAL HIGH (ref 0.61–1.24)
GFR, Estimated: 13 mL/min — ABNORMAL LOW (ref >60)
Glucose, Bld: 65 mg/dL — ABNORMAL LOW (ref 70–99)
Potassium: 3.9 mmol/L (ref 3.5–5.1)
Sodium: 134 mmol/L — ABNORMAL LOW (ref 135–145)

## 2022-02-03 LAB — CBC
HCT: 27.1 % — ABNORMAL LOW (ref 39.0–52.0)
Hemoglobin: 8.8 g/dL — ABNORMAL LOW (ref 13.0–17.0)
MCH: 30 pg (ref 26.0–34.0)
MCHC: 32.5 g/dL (ref 30.0–36.0)
MCV: 92.5 fL (ref 80.0–100.0)
Platelets: 213 10*3/uL (ref 150–400)
RBC: 2.93 MIL/uL — ABNORMAL LOW (ref 4.22–5.81)
RDW: 17.1 % — ABNORMAL HIGH (ref 11.5–15.5)
WBC: 5.2 10*3/uL (ref 4.0–10.5)
nRBC: 0 % (ref 0.0–0.2)

## 2022-02-03 MED ORDER — SODIUM CHLORIDE 0.9 % IV SOLN: INTRAVENOUS | Status: DC

## 2022-02-03 NOTE — Progress Notes (Signed)
PT Cancellation Note  Patient Details Name: Brayan Votaw MRN: [id number] DOB: 02-24-1941   Cancelled Treatment:    Reason Eval/Treat Not Completed: Fatigue/lethargy limiting ability to participate  Session attempted.  Pt asleep.  Family in room stating RN and MD were recently in and pt would not wake for them.  She requested to let pt sleep.  Will attempt again at a later date.   Chesley Noon 02/03/2022, 10:14 AM

## 2022-02-03 NOTE — TOC Progression Note (Signed)
Transition of Care [facility]) - Progression Note    Patient Details  Name: Martin Gilbert MRN: [id number] Date of Birth: 11-Feb-1941  Transition of [facility]) CM/SW Contact  Izola Price, RN Phone Number: 02/03/2022, 12:04 PM  Clinical Narrative:  Daughter wishes to change DC plan to SNF. Per RN in rounds, patient baseline mentation has changed, is not opening his eyes and unable to get OOB. Per provider palliative has been consulted and PT re-evaluation will not be possible today. Will let Martin [facility] know status. After re-evaluation will progress per recommendations. Family was not happy with facilities that had accepted as being too far away.  Daughter now afraid unable to care for via [facility] with current mental status changes noticed yesterday. TOC will continue to monitor situation. Simmie Davies RN CM     Expected Discharge Plan: [place] (vs home health) Barriers to Discharge: Continued Medical Work up  Expected Discharge Plan and Services Expected Discharge Plan: [place] (vs home health)     Post Acute Care Choice:  (TBD) Living arrangements for the past 2 months: Single Family Home                                       Social Determinants of Health (SDOH) Interventions    Readmission Risk Interventions Readmission Risk Prevention Plan 04/16/2021  Transportation Screening Complete  Home Care Screening Complete  Medication Review (RN CM) Complete  Some recent data might be hidden

## 2022-02-03 NOTE — Plan of Care (Signed)
  Problem: Health Behavior/Discharge Planning: Goal: Ability to manage health-related needs will improve Outcome: Progressing   Problem: Clinical Measurements: Goal: Ability to maintain clinical measurements within normal limits will improve Outcome: Progressing   

## 2022-02-03 NOTE — Progress Notes (Signed)
Progress Note   Patient: Martin Gilbert SEG:[id number] DOB: Sep 15, 1941 DOA: 01/21/2022     13 DOS: the patient was seen and examined on 02/03/2022   Brief hospital course: [name] is an 81 y.o. M with ESRD on HD, HTN, DM, dCHF who presented with abdominal pain for 3 weeks.  In the ER, urinalysis (still makes significant urine) showed UTI.  Imaging showed low-density area near prostate concerning ofr prostatitis vs abscess.  Urology consulted and admitted on antibiotics.  Also noted to have loculated RIGHT pleural effusion.   2/6: Admitted on antibiotics, had diagnostic thoracentesis 2/7: CT surgery recommended pigtail catheter, placed 2nd day 2/8: Developed delirium 2/10: Chest fluid culture growing ESBL E coli 2/11: Delirium completely resolved 2/12: Family have elected not to do tPA in pleural catheter 2/14: Chest tube removed  Assessment and Plan: * Acute prostatitis Patient presented with suprapubic pain, difficulty urinating.  CAT scan showed a small lucency, possible prostatitis versus prostatic abscess.  Urology were consulted, recommended medical treatment and outpatient follow-up.  Sepsis was ruled out.  Urine culture with ESBL - Continue meropenem for 6 weeks, 42 days, with end date 3/23  - Ertapenem 500 IV daily via IJ PICC line  - Weekly CBC/d and CMP while on Ertapenem - Consult ID, appreciate cares  - Continue Flomax - Maintain foley at discharge --> Needs Urology follow up with Dr. Bernardo Gilbert      Gout -Continue allopurinol  Pressure injury of right buttock, stage 2 ([name])- (present on admission) - Mepilex dressing  Acute respiratory failure with hypoxia ([name]) At baseline the patient does not use oxygen.  Here he had hypoxia and dyspnea and he required up to 5 and 6 L of oxygen at times due to effusion, now weaned to room air.     Elevated troponin Ischemia ruled out.  Due to poor clearance, non-infarction troponin elevation.  Urinary retention -  See above  Pleural effusion [name] of this seems to be traumatic hemothorax, now organized.  Chest tube was placed without resolution of the effusion.  Thoracentesis culture grew ESBL, ?contaminant.  Later culture from chest tube placement showed no growth.  Discussed the nonresolving effusion with CT surgery, not surgical candidate.  Shared decision making regarding using lytics to reduce the size of the effusion was discussed with family, but ultimately it was elected to remove chest tube, do-nothing.  Given initial uncertainty about etiology of effusion and ?tuberculosis, Quantiferon gold was obtained --> this showed evidence of prior exposure.    We ruled out TB with 3 acid-fast smears negative, Low clinical suspicion for TB as well. ID following  Chronic diastolic CHF (congestive heart failure) ([name])- (present on admission) Acute CHF ruled out. - Minimize fluids   Hepatic cirrhosis ([name])- (present on admission) - Continue Lactulose, patient had very loose stools/diarrhea, skipped a dose of lactulose on 2/18, NH3 level 16  - Continue midodrine - Continue famotidine  Diabetes mellitus, type II ([name]) Glucose controlled adequately off insulin -Continue statin  Acute metabolic encephalopathy- (present on admission) Earlier in hospital stay, developed severe confusion, disorientation.  Likely due to infection, hypoxia, possibly medication side effects.  Antibiotics were broadened, psychotropics were minimized, and this resolved.  Continues to have some improving waxing and waning delirium, very mild, mostly at night. 2/18 started Seroquel 12.5 mg p.o. twice daily for acute delirium 2/19 still pt is confused/acute delirium  ESRD (end stage renal disease) ([name])- (present on admission) - Consult Nephrology for maintenance HD, appreciate cares Right IJ  catheter was pulled, chest x-ray was done to confirm position 2/15 Hypokalemia potassium 3.1, potassium 40 mEq x 1 dose given potassium  3.2---3.4--3.9, Resolved,  Hypocalcemia, calcium repleted orally, vitamin D level 34   Anemia of chronic disease- (present on admission) -Continue iron, EPO  UTI (urinary tract infection)- (present on admission) - See above  Primary hypertension Patient had low blood pressure so patient was on midodrine 2/15 blood pressure seems to be high, discontinued midodrine - Resumed home metoprolol and hydralazine with holding parameters -We will continue monitor BP and titrate medications accordingly  Sepsis ([name])-resolved as of 01/24/2022, (present on admission) Due to complicated UTI with acute urinary retention, possibly prostatitis.      Body mass index is 27.81 kg/m.   Active Pressure Injury/Wound(s)     Pressure Ulcer  Duration          Pressure Injury 01/21/22 Buttocks Right Stage 2 -  Partial thickness loss of dermis presenting as a shallow open injury with a red, pink wound bed without slough. 8 days            Subjective: No significant overnight events, patient still very confused, AAO x0, had mittens on as patient is at risk for pulling the lines.  Patient's daughter was at bedside, discussed management plan, they want him for SNF placement as unable to take care of him in this condition.    Physical Exam: Vitals:   02/02/22 1600 02/02/22 1956 02/03/22 0434 02/03/22 0727  BP: 119/61 (!) 128/44 (!) 131/44 (!) 135/48  Pulse: 81 86 74 67  Resp: 20 18 18 18   Temp: 98.3 F (36.8 C) 98.4 F (36.9 C) 99.2 F (37.3 C) 98.6 F (37 C)  TempSrc: Axillary Axillary Axillary   SpO2: 96% 97% 95% 97%  Weight:      Height:         Physical Exam: General:  alert, NAD, lying comfortably in the bed.  Appear in no distress, affect appropriate Eyes: PERRLA ENT: Oral Mucosa Clear, moist  Neck: No JVD,  Cardiovascular: S1 and S2 Present, no Murmur,  Respiratory: good respiratory effort, Bilateral Air entry equal and Decreased, no Crackles, no wheezes Abdomen: Bowel Sound  present, Soft and no tenderness,  Skin: no rashes Extremities: no Pedal edema, no calf tenderness Neurologic: without any new focal findings Gait not checked due to patient safety concerns   Data Reviewed:  Labs reviewed and analyzed  Potassium 3.1---3.2 will be corrected by hemodialysis as per nephro   Family Communication: Discussed with family at bedside 2/15   Disposition: Status is: Inpatient  Remains inpatient appropriate because: Sputum AFB negative x3, Airborne isolation has been discontinued.   2/19 still patient is very confused, not stable to discharge.  Discussed with patient's daughter at bedside, now they want SNF placement as unable to take care at home.       Planned Discharge Destination: Skilled nursing facility.     Time spent: 35 minutes  Author: Val Riles, MD 02/03/2022 11:59 AM  For on call review [email or web address].

## 2022-02-03 NOTE — Progress Notes (Signed)
Family was concerned patient was going to pass away on my shift. I attempted to contact palliative with no response. Attending notified of patient change. He stated that he was going to call the daughter and update her.

## 2022-02-03 NOTE — Progress Notes (Signed)
[facility]  ROUNDING NOTE   Subjective:   [name] was admitted to [facility] on 01/21/2022 for Urinary retention [R33.9] Back pain [M54.9] UTI (urinary tract infection) [N39.0] Pleural effusion [J90] Pleural effusion on right [J90] Acute cystitis with hematuria [N30.01] Chest pain, unspecified type [R07.9] Sepsis, due to unspecified organism, unspecified whether acute organ dysfunction present ([place]) [A41.9] Acute cough [R05.1]  Patient was seen and examined.  Requires Spanish interpreter for translation.  Currently patient's family at bedside.  Patient remains somnolent and confused.  Noted to have poor p.o. intake will start on normal saline 30 ml per hour for gentle hydration.   Objective:  Vital signs in last 24 hours:  Temp:  [98.3 F (36.8 C)-99.2 F (37.3 C)] 98.6 F (37 C) (02/19 0727) Pulse Rate:  [67-86] 67 (02/19 0727) Resp:  [18-20] 18 (02/19 0727) BP: (119-135)/(44-61) 135/48 (02/19 0727) SpO2:  [95 %-97 %] 97 % (02/19 0727)  Weight change: 0.7 kg Filed Weights   02/02/22 0500 02/02/22 1109 02/02/22 1440  Weight: 72.6 kg 73.3 kg 73 kg    Intake/Output: I/O last 3 completed shifts: In: 71 [P.O.:50] Out: 1001 [Other:1001]   Intake/Output this shift:  Total I/O In: 153 [I.V.:3; IV Piggyback:150] Out: 0   Physical Exam: General: NAD, resting in bed  Head: Normocephalic, atraumatic. Moist oral mucosal membranes  Eyes: Anicteric  Lungs:  Diminished in bases, normal effort  Heart: Regular rate and rhythm  Abdomen:  Soft, nontender  Extremities: 1+ peripheral edema.  Neurologic: Nonfocal, moving all four extremities  Skin: No lesions   Access: Left AVF  GU Foley-placed by Urology  Basic Metabolic Panel: Recent Labs  Lab 01/30/22 0519 01/31/22 0537 02/01/22 0607 02/02/22 0320 02/03/22 0500  NA 136 137 136 136 134*  K 3.1* 3.2* 3.4* 4.2 3.9  CL 98 101 96* 97* 96*  CO2 28 27 31 29 29   GLUCOSE 102* 96 103* 101* 65*  BUN  22 28* 16 23 14   CREATININE 5.50* 7.05* 4.71* 6.03* 4.35*  CALCIUM 6.9* 6.7* 7.4* 7.6* 7.5*  MG  --  1.8 1.7 1.8  --   PHOS 3.1 3.5 2.6 3.0  --      Liver Function Tests: Recent Labs  Lab 01/30/22 0519  ALBUMIN 1.6*    No results for input(s): LIPASE, AMYLASE in the last 168 hours.  Recent Labs  Lab 02/03/22 0500  AMMONIA 16     CBC: Recent Labs  Lab 01/30/22 0519 01/31/22 0537 02/01/22 0607 02/02/22 0320 02/03/22 0500  WBC 5.1 5.1 5.2 6.1 5.2  HGB 8.6* 9.2* 9.6* 10.0* 8.8*  HCT 26.8* 28.6* 29.2* 31.1* 27.1*  MCV 92.7 92.3 92.7 92.3 92.5  PLT 184 187 192 242 213     Cardiac Enzymes: No results for input(s): CKTOTAL, CKMB, CKMBINDEX, TROPONINI in the last 168 hours.  BNP: Invalid input(s): POCBNP  CBG: Recent Labs  Lab 01/28/22 0017 01/28/22 0442 01/28/22 0842 01/28/22 1645 01/28/22 2117  GLUCAP 102* 99 93 129* 57     Microbiology: Results for orders placed or performed during the hospital encounter of 01/21/22  Urine Culture     Status: Abnormal   Collection Time: 01/21/22 10:25 AM   Specimen: Urine, Random  Result Value Ref Range Status   Specimen Description   Final    URINE, RANDOM Performed at [facility], [address] [place], [place] [address]    Special Requests   Final    NONE Performed at [facility]  Lab, [place], [place] [address]    Culture (A)  Final    >=100,000 COLONIES/mL ESCHERICHIA COLI CORRECTED ON 02/10 AT 1430: PREVIOUSLY REPORTED AS MULTIPLE SPECIES PRESENT, SUGGEST RECOLLECTION Confirmed Extended Spectrum Beta-Lactamase Producer (ESBL).  In bloodstream infections from ESBL organisms, carbapenems are preferred over piperacillin/tazobactam. They are shown to have a lower risk of mortality.    Report Status 01/28/2022 FINAL  Final   Organism ID, Bacteria ESCHERICHIA COLI (A)  Final      Susceptibility   Escherichia coli - MIC*    AMPICILLIN >=32 RESISTANT Resistant     CEFAZOLIN  >=64 RESISTANT Resistant     CEFEPIME 16 RESISTANT Resistant     CEFTRIAXONE >=64 RESISTANT Resistant     CIPROFLOXACIN >=4 RESISTANT Resistant     GENTAMICIN >=16 RESISTANT Resistant     IMIPENEM <=0.25 SENSITIVE Sensitive     NITROFURANTOIN <=16 SENSITIVE Sensitive     TRIMETH/SULFA >=320 RESISTANT Resistant     AMPICILLIN/SULBACTAM >=32 RESISTANT Resistant     PIP/TAZO 8 SENSITIVE Sensitive     * >=100,000 COLONIES/mL ESCHERICHIA COLI CORRECTED ON 02/10 AT 1430: PREVIOUSLY REPORTED AS MULTIPLE SPECIES PRESENT, SUGGEST RECOLLECTION  Blood Culture (routine x 2)     Status: None   Collection Time: 01/21/22  1:12 PM   Specimen: BLOOD  Result Value Ref Range Status   Specimen Description BLOOD BLOOD RIGHT ARM  Final   Special Requests   Final    BOTTLES DRAWN AEROBIC AND ANAEROBIC Blood Culture adequate volume   Culture   Final    NO GROWTH 5 DAYS Performed at [facility], [address] [place], [place] [address]    Report Status 01/26/2022 FINAL  Final  Blood Culture (routine x 2)     Status: None   Collection Time: 01/21/22  1:12 PM   Specimen: BLOOD  Result Value Ref Range Status   Specimen Description BLOOD BLOOD RIGHT FOREARM  Final   Special Requests   Final    BOTTLES DRAWN AEROBIC AND ANAEROBIC Blood Culture adequate volume   Culture   Final    NO GROWTH 5 DAYS Performed at [facility], [address] [place], [place] [address]    Report Status 01/26/2022 FINAL  Final  Acid Fast Smear (AFB)     Status: None   Collection Time: 01/21/22  4:02 PM   Specimen: Chest; Body Fluid  Result Value Ref Range Status   AFB Specimen Processing Concentration  Final   Acid Fast Smear Negative  Final    Comment: (NOTE) Performed At: [facility] [place], [place] [id number] Rush Farmer MD OZ:[id number]    Source (AFB) NONE  Final    Comment: Performed at [facility], [address] [place], [place] [address]  Body fluid  culture w Gram Stain     Status: None   Collection Time: 01/21/22  4:02 PM   Specimen: Chest; Body Fluid  Result Value Ref Range Status   Specimen Description   Final    CHEST FLUID Performed at [facility], [place] [address] [place], [place] [address]    Special Requests   Final    NONE Performed at [facility], [address] [place], [place] [address]    Gram Stain   Final    NO WBC SEEN NO ORGANISMS SEEN Performed at [facility], [place] [address] [place],  [address]    Culture  Final    FEW ESCHERICHIA COLI Confirmed Extended Spectrum Beta-Lactamase Producer (ESBL).  In bloodstream infections from ESBL organisms, carbapenems are preferred over piperacillin/tazobactam. They are shown to have a lower risk of mortality.    Report Status 01/25/2022 FINAL  Final   Organism ID, Bacteria ESCHERICHIA COLI  Final      Susceptibility   Escherichia coli - MIC*    AMPICILLIN >=32 RESISTANT Resistant     CEFAZOLIN >=64 RESISTANT Resistant     CEFEPIME 16 RESISTANT Resistant     CEFTAZIDIME RESISTANT Resistant     CEFTRIAXONE >=64 RESISTANT Resistant     CIPROFLOXACIN >=4 RESISTANT Resistant     GENTAMICIN >=16 RESISTANT Resistant     IMIPENEM <=0.25 SENSITIVE Sensitive     TRIMETH/SULFA >=320 RESISTANT Resistant     AMPICILLIN/SULBACTAM >=32 RESISTANT Resistant     PIP/TAZO 8 SENSITIVE Sensitive     * FEW ESCHERICHIA COLI  Aerobic/Anaerobic Culture w Gram Stain (surgical/deep wound)     Status: None   Collection Time: 01/22/22  3:56 PM   Specimen: Pleural Fluid  Result Value Ref Range Status   Specimen Description   Final    PLEURAL RIGHT Performed at [facility], [address], [place], [place] [address]    Special Requests   Final    PLE FLU Performed at [facility], [address] [place], [place] [address]    Gram Stain   Final    RARE WBC PRESENT, PREDOMINANTLY MONONUCLEAR NO ORGANISMS SEEN    Culture   Final     No growth aerobically or anaerobically. Performed at [facility], [place] [address] [place], [place] [address]    Report Status 01/28/2022 FINAL  Final  Resp Panel by RT-PCR (Flu A&B, Covid) Nasopharyngeal Swab     Status: None   Collection Time: 01/23/22  4:52 PM   Specimen: Nasopharyngeal Swab; Nasopharyngeal(NP) swabs in vial transport medium  Result Value Ref Range Status   SARS Coronavirus 2 by RT PCR NEGATIVE NEGATIVE Final    Comment: (NOTE) SARS-CoV-2 target nucleic acids are NOT DETECTED.  The SARS-CoV-2 RNA is generally detectable in upper respiratory specimens during the acute phase of infection. The lowest concentration of SARS-CoV-2 viral copies this assay can detect is 138 copies/mL. A negative result does not preclude SARS-Cov-2 infection and should not be used as the sole basis for treatment or other patient management decisions. A negative result may occur with  improper specimen collection/handling, submission of specimen other than nasopharyngeal swab, presence of viral mutation(s) within the areas targeted by this assay, and inadequate number of viral copies(<138 copies/mL). A negative result must be combined with clinical observations, patient history, and epidemiological information. The expected result is Negative.  Fact Sheet for Patients:  EntrepreneurPulse.com.au  Fact Sheet for Healthcare Providers:  IncredibleEmployment.be  This test is no t yet approved or cleared by the [place] FDA and  has been authorized for detection and/or diagnosis of SARS-CoV-2 by FDA under an Emergency Use Authorization (EUA). This EUA will remain  in effect (meaning this test can be used) for the duration of the COVID-19 declaration under Section 564(b)(1) of the Act, 21 U.S.C.section 360bbb-3(b)(1), unless the authorization is terminated  or revoked sooner.       Influenza A by PCR NEGATIVE NEGATIVE Final   Influenza B by  PCR NEGATIVE NEGATIVE Final    Comment: (NOTE) The Xpert Xpress SARS-CoV-2/FLU/RSV plus assay is intended as an aid in the diagnosis  of influenza from Nasopharyngeal swab specimens and should not be used as a sole basis for treatment. Nasal washings and aspirates are unacceptable for Xpert Xpress SARS-CoV-2/FLU/RSV testing.  Fact Sheet for Patients: EntrepreneurPulse.com.au  Fact Sheet for Healthcare Providers: IncredibleEmployment.be  This test is not yet approved or cleared by the [place] FDA and has been authorized for detection and/or diagnosis of SARS-CoV-2 by FDA under an Emergency Use Authorization (EUA). This EUA will remain in effect (meaning this test can be used) for the duration of the COVID-19 declaration under Section 564(b)(1) of the Act, 21 U.S.C. section 360bbb-3(b)(1), unless the authorization is terminated or revoked.  Performed at [facility], [address] [place], [place] [address]   Aerobic/Anaerobic Culture w Gram Stain (surgical/deep wound)     Status: None   Collection Time: 01/25/22 12:00 PM   Specimen: Pleura  Result Value Ref Range Status   Specimen Description   Final    PLEURAL Performed at [facility], [address], [place], [place] [address]    Special Requests   Final    RIGHT CHEST TUBE Performed at [facility], [place], [place] [address]    Gram Stain NO WBC SEEN NO ORGANISMS SEEN   Final   Culture   Final    No growth aerobically or anaerobically. Performed at [facility], [place] [address] [place], [place] [address]    Report Status 01/30/2022 FINAL  Final  Acid Fast Smear (AFB)     Status: None   Collection Time: 01/25/22 12:05 PM   Specimen: Sputum  Result Value Ref Range Status   AFB Specimen Processing Concentration  Final   Acid Fast Smear Negative  Final    Comment: (NOTE) Performed At: [facility] [address]  [place], [place] [id number] Rush Farmer MD [id number]    Source (AFB) PLEURAL  Final    Comment: Performed at [facility], [address] [place], [place] [address]  Acid Fast Smear (AFB)     Status: None   Collection Time: 01/26/22  4:00 PM   Specimen: Sputum  Result Value Ref Range Status   AFB Specimen Processing Concentration  Final   Acid Fast Smear Negative  Final    Comment: (NOTE) Performed At: [facility] [place], [place] [id number] Rush Farmer MD [id number]    Source (AFB) EXPECTORATED SPUTUM  Final    Comment: Performed at [facility], [address] [place], [place] [address]  Acid Fast Smear (AFB)     Status: None   Collection Time: 01/27/22  2:51 PM   Specimen: Sputum  Result Value Ref Range Status   AFB Specimen Processing Concentration  Final   Acid Fast Smear Negative  Final    Comment: (NOTE) Performed At: [facility] [place], [place] [id number] Rush Farmer MD [id number]    Source (AFB) SPU  Final    Comment: Performed at [facility], [address] [place],  [address]    Coagulation Studies: No results for input(s): LABPROT, INR in the last 72 hours.   Urinalysis: No results for input(s): COLORURINE, LABSPEC, PHURINE, GLUCOSEU, HGBUR, BILIRUBINUR, KETONESUR, PROTEINUR, UROBILINOGEN, NITRITE, LEUKOCYTESUR in the last 72 hours.  Invalid input(s): APPERANCEUR     Imaging: No results found.   Medications:    sodium chloride     ertapenem 500 mg (02/02/22 1715)    allopurinol  50 mg Oral Daily  atorvastatin  20 mg Oral Daily   calcium carbonate  1 tablet Oral BID WC   Chlorhexidine Gluconate Cloth  6 each Topical Q0600   cholecalciferol  1,000 Units Oral Daily   epoetin (EPOGEN/PROCRIT) injection  10,000 Units Intravenous Q T,Th,Sa-HD   famotidine  20 mg Oral Daily   ferrous sulfate  325 mg Oral BID WC   heparin injection (subcutaneous)   5,000 Units Subcutaneous Q8H   hydrALAZINE  50 mg Oral TID   lactulose  20 g Oral BID   metoprolol succinate  25 mg Oral Daily   QUEtiapine  12.5 mg Oral BID   senna-docusate  2 tablet Oral QHS   sodium chloride flush  3 mL Intravenous Q12H   tamsulosin  0.4 mg Oral QPC supper     Assessment/ Plan:  [name] is a 81 y.o. Hispanic male who speaks [place] and limited [place] and [place].  Patient with end stage renal disease on hemodialysis, hypertension, BPH, diabetes mellitus type II, hyperlipidemia who presents to [facility] on 01/21/2022 for Urinary retention [R33.9] Back pain [M54.9] UTI (urinary tract infection) [N39.0] Pleural effusion [J90] Pleural effusion on right [J90] Acute cystitis with hematuria [N30.01] Chest pain, unspecified type [R07.9] Sepsis, due to unspecified organism, unspecified whether acute organ dysfunction present ([place]) [A41.9] Acute cough [R05.1]   [facility] T[facility] Left AVF 73kg.    End stage renal disease: Patient is on TTS schedule. Dialysis coordinator aware of patient and awaiting discharge planning to determine outpatient needs.  Started on normal saline IVF for gentle hydration.    Anemia of chronic kidney disease: Hgb 8.8 today.  EPO with dialysis treatments   Hypertension: .Home regimen includes hydralazine, metoprolol and tamsulosin. BP stable.    Secondary Hyperparathyroidism: Continue cholecalciferol daily. Phosphorus within normal range.   5.  Right pleural effusion likely due to rib fracture.  Posttraumatic hemorrhagic pleural effusion.  S/ P Right chest tube. Specimen positive for e coli. Pulmonology and ID  following. PICC placed for continued antibiotic therapy after discharge. Ertapenem 500mg  IV daily for 6 weeks    LOS: [place] 2/19/20233:12 PM

## 2022-02-04 DIAGNOSIS — N410 Acute prostatitis: Secondary | ICD-10-CM | POA: Diagnosis not present

## 2022-02-04 LAB — BASIC METABOLIC PANEL
Anion gap: 11 (ref 5–15)
BUN: 21 mg/dL (ref 8–23)
CO2: 27 mmol/L (ref 22–32)
Calcium: 7.1 mg/dL — ABNORMAL LOW (ref 8.9–10.3)
Chloride: 97 mmol/L — ABNORMAL LOW (ref 98–111)
Creatinine, Ser: 5.96 mg/dL — ABNORMAL HIGH (ref 0.61–1.24)
GFR, Estimated: 9 mL/min — ABNORMAL LOW (ref >60)
Glucose, Bld: 58 mg/dL — ABNORMAL LOW (ref 70–99)
Potassium: 3.8 mmol/L (ref 3.5–5.1)
Sodium: 135 mmol/L (ref 135–145)

## 2022-02-04 LAB — CBC
HCT: 28.2 % — ABNORMAL LOW (ref 39.0–52.0)
Hemoglobin: 9 g/dL — ABNORMAL LOW (ref 13.0–17.0)
MCH: 29.5 pg (ref 26.0–34.0)
MCHC: 31.9 g/dL (ref 30.0–36.0)
MCV: 92.5 fL (ref 80.0–100.0)
Platelets: 211 10*3/uL (ref 150–400)
RBC: 3.05 MIL/uL — ABNORMAL LOW (ref 4.22–5.81)
RDW: 17.1 % — ABNORMAL HIGH (ref 11.5–15.5)
WBC: 5 10*3/uL (ref 4.0–10.5)
nRBC: 0 % (ref 0.0–0.2)

## 2022-02-04 LAB — HEPATIC FUNCTION PANEL
ALT: 9 U/L (ref 0–44)
AST: 30 U/L (ref 15–41)
Albumin: 1.6 g/dL — ABNORMAL LOW (ref 3.5–5.0)
Alkaline Phosphatase: 243 U/L — ABNORMAL HIGH (ref 38–126)
Bilirubin, Direct: 0.2 mg/dL (ref 0.0–0.2)
Indirect Bilirubin: 0.9 mg/dL (ref 0.3–0.9)
Total Bilirubin: 1.1 mg/dL (ref 0.3–1.2)
Total Protein: 6.1 g/dL — ABNORMAL LOW (ref 6.5–8.1)

## 2022-02-04 LAB — GLUCOSE, CAPILLARY
Glucose-Capillary: 114 mg/dL — ABNORMAL HIGH (ref 70–99)
Glucose-Capillary: 48 mg/dL — ABNORMAL LOW (ref 70–99)
Glucose-Capillary: 59 mg/dL — ABNORMAL LOW (ref 70–99)
Glucose-Capillary: 80 mg/dL (ref 70–99)
Glucose-Capillary: 81 mg/dL (ref 70–99)
Glucose-Capillary: 91 mg/dL (ref 70–99)
Glucose-Capillary: 94 mg/dL (ref 70–99)

## 2022-02-04 MED ORDER — BLISTEX MEDICATED EX OINT
TOPICAL_OINTMENT | CUTANEOUS | Status: DC | PRN
  Filled 2022-02-04: qty 6.3

## 2022-02-04 MED ORDER — DEXTROSE 50 % IV SOLN
25.0000 g | INTRAVENOUS | Status: AC
  Administered 2022-02-04: 25 g via INTRAVENOUS

## 2022-02-04 MED ORDER — DEXTROSE 50 % IV SOLN
12.5000 g | INTRAVENOUS | Status: AC
  Administered 2022-02-04: 12.5 g via INTRAVENOUS
  Filled 2022-02-04: qty 50

## 2022-02-04 MED ORDER — DEXTROSE 50 % IV SOLN: 12.5000 g | INTRAVENOUS | Status: DC

## 2022-02-04 MED ORDER — DEXTROSE-NACL 5-0.9 % IV SOLN: INTRAVENOUS | Status: DC

## 2022-02-04 MED ORDER — DEXTROSE 50 % IV SOLN
INTRAVENOUS | Status: AC
Start: 2022-02-04 — End: 2022-02-04
  Administered 2022-02-04: 25 g via INTRAVENOUS
  Filled 2022-02-04: qty 50

## 2022-02-04 NOTE — TOC Progression Note (Signed)
Transition of Care [facility]) - Progression Note    Patient Details  Name: Martin Gilbert MRN: [id number] Date of Birth: May 23, 1941  Transition of Care [facility]) CM/SW Contact  Beverly Sessions, RN Phone Number: 02/04/2022, 10:46 AM  Clinical Narrative:    Spoke with daughter She accepts bed at [facility]  [name] HD coordinator notified and will start transition of HD center [place] with [facility] notified Malachy Mood with [facility] notified Mitchell Heir at [facility] notified    Expected Discharge Plan: [place] (vs home health) Barriers to Discharge: Continued Medical Work up  Expected Discharge Plan and Services Expected Discharge Plan: [place] (vs home health)     Post Acute Care Choice:  (TBD) Living arrangements for the past 2 months: Single Family Home                                       Social Determinants of Health (SDOH) Interventions    Readmission Risk Interventions Readmission Risk Prevention Plan 04/16/2021  Transportation Screening Complete  Home Care Screening Complete  Medication Review (RN CM) Complete  Some recent data might be hidden

## 2022-02-04 NOTE — Progress Notes (Signed)
PT Cancellation Note  Patient Details Name: Martin Gilbert MRN: [id number] DOB: [birth year]   Cancelled Treatment:    Reason Eval/Treat Not Completed: Fatigue/lethargy limiting ability to participate  Pt remains too lethargic to participate in session.  Palliative has been consulted.  Will await decision of family if they transition to Hospice services and continue as appropriate.   Chesley Noon 02/04/2022, 3:42 PM

## 2022-02-04 NOTE — Plan of Care (Signed)
°  Problem: Education: Goal: Knowledge of General Education information will improve Description: Including pain rating scale, medication(s)/side effects and non-pharmacologic comfort measures Outcome: Not Progressing   Problem: Health Behavior/Discharge Planning: Goal: Ability to manage health-related needs will improve Outcome: Not Progressing   Problem: Clinical Measurements: Goal: Ability to maintain clinical measurements within normal limits will improve Outcome: Not Progressing   Problem: Clinical Measurements: Goal: Ability to maintain clinical measurements within normal limits will improve Outcome: Not Progressing   Problem: Clinical Measurements: Goal: Ability to maintain clinical measurements within normal limits will improve Outcome: Not Progressing Goal: Will remain free from infection Outcome: Not Progressing Goal: Diagnostic test results will improve Outcome: Not Progressing Goal: Respiratory complications will improve Outcome: Not Progressing   Problem: Activity: Goal: Risk for activity intolerance will decrease Outcome: Not Progressing   Problem: Elimination: Goal: Will not experience complications related to bowel motility Outcome: Not Progressing   Problem: Pain Managment: Goal: General experience of comfort will improve Outcome: Not Progressing   Problem: Skin Integrity: Goal: Risk for impaired skin integrity will decrease Outcome: Not Progressing   Patient is too lethargic and there is a language barrier to trying to educate patient or some of the family.

## 2022-02-04 NOTE — Progress Notes (Signed)
OT Cancellation Note  Patient Details Name: Martin Gilbert MRN: [id number] DOB: 1941/11/12   Cancelled Treatment:    Reason Eval/Treat Not Completed: Fatigue/lethargy limiting ability to participate. Chart reviewed, spoke with RN via secure chat. Pt remains lethargic. Will hold OT tx this date and re-attempt next date as medically appropriate.   Ardeth Perfect., MPH, MS, OTR/L ascom [phone] 02/04/22, 2:29 PM

## 2022-02-04 NOTE — Progress Notes (Signed)
Palliative: Chart review completed.  Conference with bedside nursing staff who shares that [name] is lethargic, unwilling/unable to take medications.  No family present at this time.  Conference with RD who shares that [name] has had minimal by mouth intake for 2 weeks.  Family declines NG tube in the past.  [name] speaks a dialect of Spanish that our interpreter services does not know.  Interpretation services are unable to communicate with him effectively.  His daughter has been his main interpreter.  It seems that [name] has continued to decline.  Per nursing notes, family shared that they felt that he may be dying 2/19.  He would clearly qualify for residential hospice care if family were to so desired.  He would also qualify for at home "comfort care" hospice services.   Conference with attending, bedside nursing staff, RD, nephrology, transition of care team related to patient condition, needs, goals of care, disposition.  Plan: At this point continue to treat the treatable but no CPR or intubation.  Regular HD days are Tuesday Thursday Saturday.  Would clearly qualify for residential hospice/at home "comfort care" hospice.  PMT to follow with family.  No charge Martin Axe, NP Palliative medicine team Team phone [phone] Greater than 50% of this time was spent counseling and coordinating care related to the above assessment and plan.

## 2022-02-04 NOTE — Progress Notes (Signed)
SLP Cancellation Note  Patient Details Name: Martin Gilbert MRN: [id number] DOB: 07/03/1941   Cancelled treatment:       Reason Eval/Treat Not Completed: Medical issues which prohibited therapy;Fatigue/lethargy limiting ability to participate   Per RN, Jerene Pitch, pt continues with reduced LOA not supportive of safe PO intake. Will continue efforts as appropriate.  Cherrie Gauze, M.S., [facility] [phone] [place])   Quintella Baton 02/04/2022, 11:46 AM

## 2022-02-04 NOTE — Progress Notes (Signed)
Initial Nutrition Assessment  DOCUMENTATION CODES:   Non-severe (moderate) malnutrition in context of chronic illness  INTERVENTION:   RD will monitor for GOC vs the need for nutrition support   Pt at high refeed risk  NUTRITION DIAGNOSIS:   Moderate Malnutrition related to chronic illness (ESRD on HD, cirrhosis) as evidenced by moderate fat depletion, moderate muscle depletion, severe muscle depletion.  GOAL:   Patient will meet greater than or equal to 90% of their needs  MONITOR:   Labs, Diet advancement, Weight trends, Skin, I & O's  REASON FOR ASSESSMENT:   NPO/Clear Liquid Diet    ASSESSMENT:   81 y/o male with h/o BPH, HTN, HLD, ESRD on HD, DM, cirrhsosis, CHF and C-diff who is admitted with acute prostatitis, sepsis and UTI.  Pt s/p right IJ approach single lumen tunneled cuffed central venous catheter 2/14   Met with pt and family in room today. Pt is known to this RD from a recent previous admit. Pt with AMS and is unable to provide any history. Family at bedside reports pt with good appetite and oral intake at baseline; pt was also eating well during his last admission. Pt with worsening AMS and is unable to provide any history so history obtained from family at bedside. Pt with poor appetite and oral intake since admission and is currently NPO; pt has now been without adequate nutrition for almost two weeks. RD discussed with family at bedside the importance of adequate nutrition needed to survive and recover. Family does not wish for patient to have nasogastric tube placement and feeds; pt would likely remove tube as he has been pulling at lines and previously swatting daughter away when she tried to feed him. Pt is a poor candidate for TPN give h/o cirrhosis and ESRD on HD. Discussed pt's nutritional status with MD. Will plan to have palliative care meet with family again to discuss goals. Pt is at high refeed risk; recommend monitoring electrolytes as pt initiated on  dextrose today. Per chart, pt is down ~18lbs since admission; pt down ~4lbs from his UBW. Pt previously with chest tube for pleural effusion but this was removed on 2/14. RD will monitor for GOC vs the need for nutrition support.   Medications reviewed and include: allopurinol, oscal, D3, epoetin, pepcid, ferrous sulfate, heparin, lactulose, senokot, NaCl w/ 5% dextrose _0 /hr, ertapenem   Labs reviewed: K 3.8 wnl, creat 5.96(H) Hgb 9.0(L), Hct 28.2(L) Cbgs- 91, 59, 114, 48 x 24 hrs  NUTRITION - FOCUSED PHYSICAL EXAM:  Flowsheet Row Most Recent Value  Orbital Region Mild depletion  Upper Arm Region Moderate depletion  Thoracic and Lumbar Region Moderate depletion  Buccal Region Moderate depletion  Temple Region Moderate depletion  Clavicle Bone Region Moderate depletion  Clavicle and Acromion Bone Region Moderate depletion  Scapular Bone Region Moderate depletion  Dorsal Hand Moderate depletion  Patellar Region Severe depletion  Anterior Thigh Region Severe depletion  Posterior Calf Region Severe depletion  Edema (RD Assessment) None  Hair Reviewed  Eyes Reviewed  Mouth Reviewed  Skin Reviewed  Nails Reviewed   Diet Order:   Diet Order             Diet NPO time specified  Diet effective now                  EDUCATION NEEDS:   No education needs have been identified at this time  Skin:  Skin Assessment: Reviewed RN Assessment (Stage II buttocks)  Last BM:  2/20- type 7  Height:   Ht Readings from Last 1 Encounters:  01/21/22 _0  (1.651 m)    Weight:   Wt Readings from Last 1 Encounters:  02/04/22 69.9 kg    Ideal Body Weight:  61.8 kg  BMI:  Body mass index is 25.64 kg/m.  Estimated Nutritional Needs:   Kcal:  1700-2000kcal/day  Protein:  85-100g/day  Fluid:  1.7-2.0L/day  Koleen Distance MS, RD, LDN Please refer to [facility] for RD and/or RD on-call/weekend/after hours pager

## 2022-02-04 NOTE — Progress Notes (Signed)
°   01/31/22 1230 01/31/22 1238  Vitals  Pulse Rate 70 73  ECG Heart Rate 71 76  Resp 15 15  During Hemodialysis Assessment  Blood Flow Rate (mL/min) 400 mL/min  --   Arterial Pressure (mmHg) -170 mmHg  --   Venous Pressure (mmHg) 160 mmHg  --   Transmembrane Pressure (mmHg) 50 mmHg  --   Ultrafiltration Rate (mL/min) 340 mL/min  --   Dialysate Flow Rate (mL/min) 500 ml/min  --   Conductivity: Machine  13.6  --   HD Safety Checks Performed Yes  --   Intra-Hemodialysis Comments Progressing as prescribed;Tolerated well Tx completed

## 2022-02-04 NOTE — Progress Notes (Signed)
Dr. Dwyane Dee rounding on patient notified of low blood sugar and asked about keeping the foley.  MD wants to keep foley at this time due to prostatitis and overall picture.

## 2022-02-04 NOTE — Plan of Care (Signed)
  Problem: Clinical Measurements: Goal: Ability to maintain clinical measurements within normal limits will improve Outcome: Progressing   

## 2022-02-04 NOTE — Progress Notes (Signed)
SLP Cancellation Note  Patient Details Name: Martin Gilbert MRN: [id number] DOB: Nov 26, 1941   Cancelled treatment:       Reason Eval/Treat Not Completed: Medical issues which prohibited therapy;Fatigue/lethargy limiting ability to participate   SLP consult received and appreciated. Chart review completed. Spoke with RN, Jerene Pitch. Per RN, pt too somnolent at present for safe PO intake. RN unsure if due to hypoglycemia. Will f/u this PM to determine appropriateness for SLP evaluation. RN aware.  Cherrie Gauze, M.S., [facility] [phone] [place])   Quintella Baton 02/04/2022, 9:18 AM

## 2022-02-04 NOTE — Progress Notes (Signed)
Progress Note   Patient: Martin Gilbert OIN:[id number] DOB: 03-14-1941 DOA: 01/21/2022     14 DOS: the patient was seen and examined on 02/04/2022   Brief hospital course: [name] is an 81 y.o. M with ESRD on HD, HTN, DM, dCHF who presented with abdominal pain for 3 weeks.  In the ER, urinalysis (still makes significant urine) showed UTI.  Imaging showed low-density area near prostate concerning ofr prostatitis vs abscess.  Urology consulted and admitted on antibiotics.  Also noted to have loculated RIGHT pleural effusion.   2/6: Admitted on antibiotics, had diagnostic thoracentesis 2/7: CT surgery recommended pigtail catheter, placed 2nd day 2/8: Developed delirium 2/10: Chest fluid culture growing ESBL E coli 2/11: Delirium completely resolved 2/12: Family have elected not to do tPA in pleural catheter 2/14: Chest tube removed  Assessment and Plan: * Acute prostatitis Patient presented with suprapubic pain, difficulty urinating.  CAT scan showed a small lucency, possible prostatitis versus prostatic abscess.  Urology were consulted, recommended medical treatment and outpatient follow-up.  Sepsis was ruled out.  Urine culture with ESBL - Continue meropenem for 6 weeks, 42 days, with end date 3/23  - Ertapenem 500 IV daily via IJ PICC line  - Weekly CBC/d and CMP while on Ertapenem - Consult ID, appreciate cares  - Continue Flomax - Maintain foley at discharge --> Needs Urology follow up with Dr. [name]      Gout -Continue allopurinol  Pressure injury of right buttock, stage 2 ([place])- (present on admission) - Mepilex dressing  Acute respiratory failure with hypoxia ([place]) At baseline the patient does not use oxygen.  Here he had hypoxia and dyspnea and he required up to 5 and 6 L of oxygen at times due to effusion, now weaned to room air.     Elevated troponin Ischemia ruled out.  Due to poor clearance, non-infarction troponin elevation.  Urinary retention -  See above  Pleural effusion [name] of this seems to be traumatic hemothorax, now organized.  Chest tube was placed without resolution of the effusion.  Thoracentesis culture grew ESBL, ?contaminant.  Later culture from chest tube placement showed no growth.  Discussed the nonresolving effusion with CT surgery, not surgical candidate.  Shared decision making regarding using lytics to reduce the size of the effusion was discussed with family, but ultimately it was elected to remove chest tube, do-nothing.  Given initial uncertainty about etiology of effusion and ?tuberculosis, Quantiferon gold was obtained --> this showed evidence of prior exposure.    We ruled out TB with 3 acid-fast smears negative, Low clinical suspicion for TB as well. ID following  Chronic diastolic CHF (congestive heart failure) ([place])- (present on admission) Acute CHF ruled out. - Minimize fluids   Hepatic cirrhosis ([place])- (present on admission) - Continue Lactulose, patient had very loose stools/diarrhea, skipped a dose of lactulose on 2/18, NH3 level 16  - Continue midodrine - Continue famotidine  Diabetes mellitus, type II ([name]) Glucose controlled adequately off insulin -Continue statin  Acute metabolic encephalopathy- (present on admission) Earlier in hospital stay, developed severe confusion, disorientation.  Likely due to infection, hypoxia, possibly medication side effects.  Antibiotics were broadened, psychotropics were minimized, and this resolved.  Continues to have some improving waxing and waning delirium, very mild, mostly at night. 2/18 started Seroquel 12.5 mg p.o. twice daily for acute delirium 2/19 still pt is confused/acute delirium 2/20 very confused, AAO x0, unable to swallow anything, discontinued Seroquel.  Discussed with the patient's family, and they do  not want NG tube feeding at this time.  Palliative care will follow with the family regarding goals of care discussion and possible hospice  if they will agree  ESRD (end stage renal disease) ([place])- (present on admission) - Consult Nephrology for maintenance HD, appreciate cares Right IJ catheter was pulled, chest x-ray was done to confirm position 2/15 Hypokalemia potassium 3.1, potassium 40 mEq x 1 dose given potassium 3.2---3.4--3.9, Resolved,  Hypocalcemia, calcium repleted orally, vitamin D level 34   Anemia of chronic disease- (present on admission) -Continue iron, EPO  UTI (urinary tract infection)- (present on admission) - See above  Primary hypertension Patient had low blood pressure so patient was on midodrine 2/15 blood pressure seems to be high, discontinued midodrine - Resumed home metoprolol and hydralazine with holding parameters -We will continue monitor BP and titrate medications accordingly  Sepsis ([name])-resolved as of 01/24/2022, (present on admission) Due to complicated UTI with acute urinary retention, possibly prostatitis.      Body mass index is 27.81 kg/m.   Active Pressure Injury/Wound(s)     Pressure Ulcer  Duration          Pressure Injury 01/21/22 Buttocks Right Stage 2 -  Partial thickness loss of dermis presenting as a shallow open injury with a red, pink wound bed without slough. 8 days            Subjective: No significant overnight events, patient still very confused, AAO x0, had mittens on as patient is at risk for pulling the lines.  Patient's daughter was at bedside, discussed management plan, they want him for SNF placement as unable to take care of him in this condition.    Physical Exam: Vitals:   02/03/22 2004 02/04/22 0100 02/04/22 0257 02/04/22 0745  BP: (!) 140/46  (!) 140/52 (!) 144/62  Pulse: 69  85 88  Resp: 16  18 16   Temp: 98.6 F (37 C)  98 F (36.7 C) 98.1 F (36.7 C)  TempSrc:   Axillary Axillary  SpO2: 97%  98% 97%  Weight:  69.9 kg    Height:         Physical Exam: General: Confused, AMS, AAO x0   Appear in no resp. distress, Eyes:  PERRL ENT: Oral Mucosa Clear, moist  Neck: No JVD,  Cardiovascular: S1 and S2 Present, no Murmur,  Respiratory: good respiratory effort, Bilateral Air entry equal and Decreased, no Crackles, no wheezes Abdomen: Bowel Sound present, Soft and no tenderness,  Skin: no rashes Extremities: no Pedal edema, no calf tenderness Neurologic: without any new focal findings Gait not checked due to patient safety concerns   Data Reviewed:  Labs reviewed and analyzed  Potassium 3.1---3.2 will be corrected by hemodialysis as per nephro   Family Communication: Discussed with family at bedside 2/15   Disposition: Status is: Inpatient  Remains inpatient appropriate because: Sputum AFB negative x3, Airborne isolation has been discontinued.   2/19 still patient is very confused, not stable to discharge.  Discussed with patient's daughter at bedside, now they want SNF placement as unable to take care at home.   2/20 AMS, confused, unable to take anything orally.  Palliative care consulted to discuss with family for possible hospice placement, overall poor prognosis    Planned Discharge Destination: Skilled nursing facility.     Time spent: 35 minutes  Author: Val Riles, MD 02/04/2022 4:51 PM  For on call review [email or web address].

## 2022-02-04 NOTE — Progress Notes (Signed)
[facility]  ROUNDING NOTE   Subjective:   [name] was admitted to [facility] on 01/21/2022 for Urinary retention [R33.9] Back pain [M54.9] UTI (urinary tract infection) [N39.0] Pleural effusion [J90] Pleural effusion on right [J90] Acute cystitis with hematuria [N30.01] Chest pain, unspecified type [R07.9] Sepsis, due to unspecified organism, unspecified whether acute organ dysfunction present ([place]) [A41.9] Acute cough [R05.1]  Patient seen resting quietly, family at bedside Patient remains alert but confused Poor appetite remains with very little intake reported over the weekend.  Dialysis scheduled for tomorrow Discharge planning in progress   Objective:  Vital signs in last 24 hours:  Temp:  [97.9 F (36.6 C)-98.6 F (37 C)] 98.1 F (36.7 C) (02/20 0745) Pulse Rate:  [64-88] 88 (02/20 0745) Resp:  [16-18] 16 (02/20 0745) BP: (140-144)/(46-62) 144/62 (02/20 0745) SpO2:  [97 %-100 %] 97 % (02/20 0745) Weight:  [69.9 kg] 69.9 kg (02/20 0100)  Weight change: -3.4 kg Filed Weights   02/02/22 1109 02/02/22 1440 02/04/22 0100  Weight: 73.3 kg 73 kg 69.9 kg    Intake/Output: I/O last 3 completed shifts: In: 548.4 [P.O.:100; I.V.:248.4; IV Piggyback:200] Out: 0    Intake/Output this shift:  Total I/O In: 258.6 [I.V.:208.6; IV Piggyback:50] Out: -   Physical Exam: General: NAD, resting in bed  Head: Normocephalic, atraumatic. Moist oral mucosal membranes  Eyes: Anicteric  Lungs:  Diminished in bases, normal effort  Heart: Regular rate and rhythm  Abdomen:  Soft, nontender  Extremities: 1+ peripheral edema.  Neurologic: Nonfocal, moving all four extremities  Skin: No lesions   Access: Left AVF  GU Foley-placed by Urology  Basic Metabolic Panel: Recent Labs  Lab 01/30/22 0519 01/31/22 0537 02/01/22 0607 02/02/22 0320 02/03/22 0500 02/04/22 0553  NA 136 137 136 136 134* 135  K 3.1* 3.2* 3.4* 4.2 3.9 3.8  CL 98 101 96* 97* 96*  97*  CO2 28 27 31 29 29 27   GLUCOSE 102* 96 103* 101* 65* 58*  BUN 22 28* 16 23 14 21   CREATININE 5.50* 7.05* 4.71* 6.03* 4.35* 5.96*  CALCIUM 6.9* 6.7* 7.4* 7.6* 7.5* 7.1*  MG  --  1.8 1.7 1.8  --   --   PHOS 3.1 3.5 2.6 3.0  --   --      Liver Function Tests: Recent Labs  Lab 01/30/22 0519 02/04/22 0553  AST  --  30  ALT  --  9  ALKPHOS  --  243*  BILITOT  --  1.1  PROT  --  6.1*  ALBUMIN 1.6* 1.6*    No results for input(s): LIPASE, AMYLASE in the last 168 hours.  Recent Labs  Lab 02/03/22 0500  AMMONIA 16     CBC: Recent Labs  Lab 01/31/22 0537 02/01/22 0607 02/02/22 0320 02/03/22 0500 02/04/22 0553  WBC 5.1 5.2 6.1 5.2 5.0  HGB 9.2* 9.6* 10.0* 8.8* 9.0*  HCT 28.6* 29.2* 31.1* 27.1* 28.2*  MCV 92.3 92.7 92.3 92.5 92.5  PLT 187 192 242 213 211     Cardiac Enzymes: No results for input(s): CKTOTAL, CKMB, CKMBINDEX, TROPONINI in the last 168 hours.  BNP: Invalid input(s): POCBNP  CBG: Recent Labs  Lab 01/28/22 2117 02/04/22 0751 02/04/22 0916 02/04/22 1226 02/04/22 1335  GLUCAP 89 48* 114* 41* 58     Microbiology: Results for orders placed or performed during the hospital encounter of 01/21/22  Urine Culture     Status: Abnormal   Collection Time: 01/21/22 10:25 AM  Specimen: Urine, Random  Result Value Ref Range Status   Specimen Description   Final    URINE, RANDOM Performed at [facility], [address] [place], [place] [address]    Special Requests   Final    NONE Performed at [facility], [address] [place], [place] [address]    Culture (A)  Final    >=100,000 COLONIES/mL ESCHERICHIA COLI CORRECTED ON 02/10 AT 1430: PREVIOUSLY REPORTED AS MULTIPLE SPECIES PRESENT, SUGGEST RECOLLECTION Confirmed Extended Spectrum Beta-Lactamase Producer (ESBL).  In bloodstream infections from ESBL organisms, carbapenems are preferred over piperacillin/tazobactam. They are shown to have a lower risk of mortality.     Report Status 01/28/2022 FINAL  Final   Organism ID, Bacteria ESCHERICHIA COLI (A)  Final      Susceptibility   Escherichia coli - MIC*    AMPICILLIN >=32 RESISTANT Resistant     CEFAZOLIN >=64 RESISTANT Resistant     CEFEPIME 16 RESISTANT Resistant     CEFTRIAXONE >=64 RESISTANT Resistant     CIPROFLOXACIN >=4 RESISTANT Resistant     GENTAMICIN >=16 RESISTANT Resistant     IMIPENEM <=0.25 SENSITIVE Sensitive     NITROFURANTOIN <=16 SENSITIVE Sensitive     TRIMETH/SULFA >=320 RESISTANT Resistant     AMPICILLIN/SULBACTAM >=32 RESISTANT Resistant     PIP/TAZO 8 SENSITIVE Sensitive     * >=100,000 COLONIES/mL ESCHERICHIA COLI CORRECTED ON 02/10 AT 1430: PREVIOUSLY REPORTED AS MULTIPLE SPECIES PRESENT, SUGGEST RECOLLECTION  Blood Culture (routine x 2)     Status: None   Collection Time: 01/21/22  1:12 PM   Specimen: BLOOD  Result Value Ref Range Status   Specimen Description BLOOD BLOOD RIGHT ARM  Final   Special Requests   Final    BOTTLES DRAWN AEROBIC AND ANAEROBIC Blood Culture adequate volume   Culture   Final    NO GROWTH 5 DAYS Performed at [facility], [address] [place], [place] [address]    Report Status 01/26/2022 FINAL  Final  Blood Culture (routine x 2)     Status: None   Collection Time: 01/21/22  1:12 PM   Specimen: BLOOD  Result Value Ref Range Status   Specimen Description BLOOD BLOOD RIGHT FOREARM  Final   Special Requests   Final    BOTTLES DRAWN AEROBIC AND ANAEROBIC Blood Culture adequate volume   Culture   Final    NO GROWTH 5 DAYS Performed at [facility], [address] [name], [place] [address]    Report Status 01/26/2022 FINAL  Final  Acid Fast Smear (AFB)     Status: None   Collection Time: 01/21/22  4:02 PM   Specimen: Chest; Body Fluid  Result Value Ref Range Status   AFB Specimen Processing Concentration  Final   Acid Fast Smear Negative  Final    Comment: (NOTE) Performed At: [facility] [place], [place] [id number] Rush Farmer MD IO:[id number]    Source (AFB) NONE  Final    Comment: Performed at [facility], [address] [place], [place] [address]  Body fluid culture w Gram Stain     Status: None   Collection Time: 01/21/22  4:02 PM   Specimen: Chest; Body Fluid  Result Value Ref Range Status   Specimen Description   Final    CHEST FLUID Performed at [facility], [place] [address], [place], [place] [address]    Special Requests   Final  NONE Performed at [facility], [address] [place], [place] [address]    Gram Stain   Final    NO WBC SEEN NO ORGANISMS SEEN Performed at [facility], [place] [address] [place], [place] [address]    Culture   Final    FEW ESCHERICHIA COLI Confirmed Extended Spectrum Beta-Lactamase Producer (ESBL).  In bloodstream infections from ESBL organisms, carbapenems are preferred over piperacillin/tazobactam. They are shown to have a lower risk of mortality.    Report Status 01/25/2022 FINAL  Final   Organism ID, Bacteria ESCHERICHIA COLI  Final      Susceptibility   Escherichia coli - MIC*    AMPICILLIN >=32 RESISTANT Resistant     CEFAZOLIN >=64 RESISTANT Resistant     CEFEPIME 16 RESISTANT Resistant     CEFTAZIDIME RESISTANT Resistant     CEFTRIAXONE >=64 RESISTANT Resistant     CIPROFLOXACIN >=4 RESISTANT Resistant     GENTAMICIN >=16 RESISTANT Resistant     IMIPENEM <=0.25 SENSITIVE Sensitive     TRIMETH/SULFA >=320 RESISTANT Resistant     AMPICILLIN/SULBACTAM >=32 RESISTANT Resistant     PIP/TAZO 8 SENSITIVE Sensitive     * FEW ESCHERICHIA COLI  Aerobic/Anaerobic Culture w Gram Stain (surgical/deep wound)     Status: None   Collection Time: 01/22/22  3:56 PM   Specimen: Pleural Fluid  Result Value Ref Range Status   Specimen Description   Final    PLEURAL RIGHT Performed at [facility], [address] [place], [place] [address]    Special Requests   Final    PLE  FLU Performed at [facility], [address] [place], [place] [address]    Gram Stain   Final    RARE WBC PRESENT, PREDOMINANTLY MONONUCLEAR NO ORGANISMS SEEN    Culture   Final    No growth aerobically or anaerobically. Performed at [facility], [place] [address], [place],  [address]    Report Status 01/28/2022 FINAL  Final  Resp Panel by RT-PCR (Flu A&B, Covid) Nasopharyngeal Swab     Status: None   Collection Time: 01/23/22  4:52 PM   Specimen: Nasopharyngeal Swab; Nasopharyngeal(NP) swabs in vial transport medium  Result Value Ref Range Status   SARS Coronavirus 2 by RT PCR NEGATIVE NEGATIVE Final    Comment: (NOTE) SARS-CoV-2 target nucleic acids are NOT DETECTED.  The SARS-CoV-2 RNA is generally detectable in upper respiratory specimens during the acute phase of infection. The lowest concentration of SARS-CoV-2 viral copies this assay can detect is 138 copies/mL. A negative result does not preclude SARS-Cov-2 infection and should not be used as the sole basis for treatment or other patient management decisions. A negative result may occur with  improper specimen collection/handling, submission of specimen other than nasopharyngeal swab, presence of viral mutation(s) within the areas targeted by this assay, and inadequate number of viral copies(<138 copies/mL). A negative result must be combined with clinical observations, patient history, and epidemiological information. The expected result is Negative.  Fact Sheet for Patients:  EntrepreneurPulse.com.au  Fact Sheet for Healthcare Providers:  IncredibleEmployment.be  This test is no t yet approved or cleared by the [place] FDA and  has been authorized for detection and/or diagnosis of SARS-CoV-2 by FDA under an Emergency Use Authorization (EUA). This EUA will remain  in effect (meaning this test can be used) for the duration of the COVID-19 declaration  under Section 564(b)(1) of the Act, 21 U.S.C.section 360bbb-3(b)(1), unless  the authorization is terminated  or revoked sooner.       Influenza A by PCR NEGATIVE NEGATIVE Final   Influenza B by PCR NEGATIVE NEGATIVE Final    Comment: (NOTE) The Xpert Xpress SARS-CoV-2/FLU/RSV plus assay is intended as an aid in the diagnosis of influenza from Nasopharyngeal swab specimens and should not be used as a sole basis for treatment. Nasal washings and aspirates are unacceptable for Xpert Xpress SARS-CoV-2/FLU/RSV testing.  Fact Sheet for Patients: EntrepreneurPulse.com.au  Fact Sheet for Healthcare Providers: IncredibleEmployment.be  This test is not yet approved or cleared by the [place] FDA and has been authorized for detection and/or diagnosis of SARS-CoV-2 by FDA under an Emergency Use Authorization (EUA). This EUA will remain in effect (meaning this test can be used) for the duration of the COVID-19 declaration under Section 564(b)(1) of the Act, 21 U.S.C. section 360bbb-3(b)(1), unless the authorization is terminated or revoked.  Performed at [facility], [address] [place], [place] [address]   Aerobic/Anaerobic Culture w Gram Stain (surgical/deep wound)     Status: None   Collection Time: 01/25/22 12:00 PM   Specimen: Pleura  Result Value Ref Range Status   Specimen Description   Final    PLEURAL Performed at [facility], [address], [place], [place] [address]    Special Requests   Final    RIGHT CHEST TUBE Performed at [facility], [place], [place] [address]    Gram Stain NO WBC SEEN NO ORGANISMS SEEN   Final   Culture   Final    No growth aerobically or anaerobically. Performed at [facility], [place] [address], [place], [place] [address]    Report Status 01/30/2022 FINAL  Final  Acid Fast Smear (AFB)     Status: None   Collection Time: 01/25/22 12:05 PM    Specimen: Sputum  Result Value Ref Range Status   AFB Specimen Processing Concentration  Final   Acid Fast Smear Negative  Final    Comment: (NOTE) Performed At: [facility] [address] [place], [place] [id number] Rush Farmer MD ZS:[id number]    Source (AFB) PLEURAL  Final    Comment: Performed at [facility], [address] [place], [place] [address]  Acid Fast Smear (AFB)     Status: None   Collection Time: 01/26/22  4:00 PM   Specimen: Sputum  Result Value Ref Range Status   AFB Specimen Processing Concentration  Final   Acid Fast Smear Negative  Final    Comment: (NOTE) Performed At: [facility] [name], [place] [id number] Rush Farmer MD SE:[id number]    Source (AFB) EXPECTORATED SPUTUM  Final    Comment: Performed at [facility], [address] [place], [place] [address]  Acid Fast Smear (AFB)     Status: None   Collection Time: 01/27/22  2:51 PM   Specimen: Sputum  Result Value Ref Range Status   AFB Specimen Processing Concentration  Final   Acid Fast Smear Negative  Final    Comment: (NOTE) Performed At: [facility] [place], [place] [id number] Rush Farmer MD EV:[id number]    Source (AFB) SPU  Final    Comment: Performed at [facility], [address] [place], [place] [address]    Coagulation Studies: No results for input(s): LABPROT, INR in the last 72 hours.   Urinalysis: No results for input(s): COLORURINE, LABSPEC, [place], [place], [place], BILIRUBINUR, KETONESUR,  PROTEINUR, UROBILINOGEN, NITRITE, LEUKOCYTESUR in the last 72 hours.  Invalid input(s): APPERANCEUR     Imaging: No results found.   Medications:    sodium chloride     dextrose 5 % and 0.9% NaCl 50 mL/hr at 02/04/22 1304   ertapenem Stopped (02/03/22 1908)    allopurinol  50 mg Oral Daily   atorvastatin  20 mg Oral Daily   calcium carbonate  1 tablet Oral BID WC   Chlorhexidine  Gluconate Cloth  6 each Topical Q0600   cholecalciferol  1,000 Units Oral Daily   dextrose  12.5 g Intravenous STAT   epoetin (EPOGEN/PROCRIT) injection  10,000 Units Intravenous Q T,Th,Sa-HD   famotidine  20 mg Oral Daily   ferrous sulfate  325 mg Oral BID WC   heparin injection (subcutaneous)  5,000 Units Subcutaneous Q8H   hydrALAZINE  50 mg Oral TID   lactulose  20 g Oral BID   metoprolol succinate  25 mg Oral Daily   senna-docusate  2 tablet Oral QHS   sodium chloride flush  3 mL Intravenous Q12H   tamsulosin  0.4 mg Oral QPC supper     Assessment/ Plan:  [name] is a 81 y.o. Hispanic male who speaks [place] and limited [place] and [place].  Patient with end stage renal disease on hemodialysis, hypertension, BPH, diabetes mellitus type II, hyperlipidemia who presents to [facility] on 01/21/2022 for Urinary retention [R33.9] Back pain [M54.9] UTI (urinary tract infection) [N39.0] Pleural effusion [J90] Pleural effusion on right [J90] Acute cystitis with hematuria [N30.01] Chest pain, unspecified type [R07.9] Sepsis, due to unspecified organism, unspecified whether acute organ dysfunction present ([place]) [A41.9] Acute cough [R05.1]   [facility] TTS [facility] Left AVF 73kg.    End stage renal disease: Patient is on TTS schedule. Dialysis coordinator aware of patient and awaiting discharge planning to determine outpatient needs.   Continues to receive gentle hydration Next dialysis scheduled for Tuesday   Anemia of chronic kidney disease: Hgb 9.0  EPO with dialysis treatments   Hypertension: .Home regimen includes hydralazine, metoprolol and tamsulosin. BP 144/62  Secondary Hyperparathyroidism: Continue cholecalciferol daily.  We will continue to monitor bone mineral metabolism during this admission  5.  Right pleural effusion likely due to rib fracture.  Posttraumatic hemorrhagic pleural effusion.  S/ P Right chest tube. Specimen positive for e coli. Pulmonology and  ID  following.  PICC placed for continued antibiotic therapy after discharge. Ertapenem 500mg  IV daily for 6 weeks    LOS: [place] 2/20/20232:41 PM

## 2022-02-04 NOTE — Progress Notes (Signed)
°   01/31/22 1100 01/31/22 1115 01/31/22 1130  Vitals  BP (!) 127/54  --  (!) 131/53  MAP (mmHg) 76  --  76  Pulse Rate 65 66 69  ECG Heart Rate 66 69 68  Resp 14 14 19   During Hemodialysis Assessment  Blood Flow Rate (mL/min) 400 mL/min 400 mL/min 400 mL/min  Arterial Pressure (mmHg) -210 mmHg -210 mmHg -210 mmHg  Venous Pressure (mmHg) 150 mmHg 150 mmHg 150 mmHg  Transmembrane Pressure (mmHg) 50 mmHg 50 mmHg 50 mmHg  Ultrafiltration Rate (mL/min) 340 mL/min 340 mL/min 340 mL/min  Dialysate Flow Rate (mL/min) 500 ml/min 500 ml/min 500 ml/min  Conductivity: Machine  13.6 13.6 13.6  HD Safety Checks Performed Yes Yes Yes  Intra-Hemodialysis Comments Progressing as prescribed;Tolerated well Progressing as prescribed;Tolerated well Progressing as prescribed;Tolerated well    01/31/22 1145 01/31/22 1200 01/31/22 1215  Vitals  BP (!) 135/45  --  (!) 132/48  MAP (mmHg) 72  --  71  Pulse Rate 69 74 69  ECG Heart Rate 70 73 69  Resp 15 18 17   During Hemodialysis Assessment  Blood Flow Rate (mL/min) 400 mL/min 400 mL/min 400 mL/min  Arterial Pressure (mmHg) -190 mmHg -180 mmHg -180 mmHg  Venous Pressure (mmHg) 160 mmHg 160 mmHg 150 mmHg  Transmembrane Pressure (mmHg) 50 mmHg 50 mmHg 50 mmHg  Ultrafiltration Rate (mL/min) 340 mL/min 340 mL/min 340 mL/min  Dialysate Flow Rate (mL/min) 500 ml/min 500 ml/min 500 ml/min  Conductivity: Machine  13.6 13.6 13.6  HD Safety Checks Performed Yes Yes Yes  Intra-Hemodialysis Comments Progressing as prescribed;Tolerated well Progressing as prescribed;Tolerated well Progressing as prescribed;Tolerated well

## 2022-02-04 NOTE — Progress Notes (Signed)
Working on transfer to [place] for SNF placement.

## 2022-02-04 NOTE — Progress Notes (Signed)
°   01/31/22 0932 01/31/22 0945 01/31/22 1000  Vitals  BP (!) 132/48 (!) 135/56  --   MAP (mmHg) 74 79  --   Pulse Rate 71 66 69  ECG Heart Rate 71 67 70  Resp 16 15 16   During Hemodialysis Assessment  Blood Flow Rate (mL/min) 400 mL/min 400 mL/min 400 mL/min  Arterial Pressure (mmHg) -170 mmHg -190 mmHg -210 mmHg  Venous Pressure (mmHg) 170 mmHg 160 mmHg 160 mmHg  Transmembrane Pressure (mmHg) 50 mmHg 50 mmHg 50 mmHg  Ultrafiltration Rate (mL/min) 330 mL/min 330 mL/min 330 mL/min  Dialysate Flow Rate (mL/min) 500 ml/min 500 ml/min 500 ml/min  Conductivity: Machine  13.7 13.7 13.6  HD Safety Checks Performed Yes Yes Yes  Intra-Hemodialysis Comments Tx initiated Progressing as prescribed;Tolerated well Progressing as prescribed;Tolerated well    01/31/22 1015 01/31/22 1030 01/31/22 1045  Vitals  BP (!) 121/45  --   --   MAP (mmHg) 69  --   --   Pulse Rate 68 69 64  ECG Heart Rate 68 70 66  Resp 14 16 15   During Hemodialysis Assessment  Blood Flow Rate (mL/min) 400 mL/min 400 mL/min 400 mL/min  Arterial Pressure (mmHg) -220 mmHg -220 mmHg -210 mmHg  Venous Pressure (mmHg) 160 mmHg 160 mmHg 160 mmHg  Transmembrane Pressure (mmHg) 50 mmHg 50 mmHg 50 mmHg  Ultrafiltration Rate (mL/min) 330 mL/min 330 mL/min 330 mL/min  Dialysate Flow Rate (mL/min) 500 ml/min 500 ml/min 500 ml/min  Conductivity: Machine  13.7 13.7 13.7  HD Safety Checks Performed Yes Yes Yes  Intra-Hemodialysis Comments Progressing as prescribed;Tolerated well Progressing as prescribed;Tolerated well Progressing as prescribed;Tolerated well

## 2022-02-04 NOTE — Progress Notes (Signed)
02/02/22 1122 02/02/22 1130 02/02/22 1145  Vitals  Temp  --   --   --   Temp Source  --   --   --   BP  --   --  (!) 137/47  MAP (mmHg)  --   --  73  Pulse Rate 74 75 72  ECG Heart Rate 76 76 72  Resp (!) 21 18 19   During Hemodialysis Assessment  Blood Flow Rate (mL/min) 400 mL/min 400 mL/min 400 mL/min  Arterial Pressure (mmHg) -180 mmHg -180 mmHg -180 mmHg  Venous Pressure (mmHg) 140 mmHg 130 mmHg 130 mmHg  Transmembrane Pressure (mmHg) 60 mmHg 50 mmHg 50 mmHg  Ultrafiltration Rate (mL/min) 500 mL/min 500 mL/min 500 mL/min  Dialysate Flow Rate (mL/min) 500 ml/min 500 ml/min 500 ml/min  Conductivity: Machine  13.7 13.7 13.7  HD Safety Checks Performed Yes Yes Yes  Intra-Hemodialysis Comments Tx initiated Progressing as prescribed;Tolerated well Progressing as prescribed;Tolerated well    02/02/22 1200 02/02/22 1205 02/02/22 1215  Vitals  Temp  --   --   --   Temp Source  --   --   --   BP  --   --  (!) 125/48  MAP (mmHg)  --   --  70  Pulse Rate 86 80 83  ECG Heart Rate 82 82 83  Resp (!) 23 19 18   During Hemodialysis Assessment  Blood Flow Rate (mL/min) 400 mL/min  --  400 mL/min  Arterial Pressure (mmHg) -270 mmHg  --  -220 mmHg  Venous Pressure (mmHg) 130 mmHg  --  130 mmHg  Transmembrane Pressure (mmHg) 50 mmHg  --  50 mmHg  Ultrafiltration Rate (mL/min) 500 mL/min  --  510 mL/min  Dialysate Flow Rate (mL/min) 500 ml/min  --  500 ml/min  Conductivity: Machine  13.7  --  13.7  HD Safety Checks Performed  --   --   --   Intra-Hemodialysis Comments Progressing as prescribed;Tolerated well  --  Progressing as prescribed;Tolerated well    02/02/22 1230 02/02/22 1245 02/02/22 1259  Vitals  Temp  --   --   --   Temp Source  --   --   --   BP (!) 103/37 (!) 121/48 (!) 116/49  MAP (mmHg) (!) 55 70 67  Pulse Rate  --   --  78  ECG Heart Rate 82 86 77  Resp 19 (!) 25 17  During Hemodialysis Assessment  Blood Flow Rate (mL/min) 400 mL/min 400 mL/min  --   Arterial  Pressure (mmHg) -220 mmHg -240 mmHg  --   Venous Pressure (mmHg) 130 mmHg 130 mmHg  --   Transmembrane Pressure (mmHg) 50 mmHg 50 mmHg  --   Ultrafiltration Rate (mL/min) 510 mL/min 510 mL/min  --   Dialysate Flow Rate (mL/min) 500 ml/min 500 ml/min  --   Conductivity: Machine  13.7 13.7  --   HD Safety Checks Performed  --   --   --   Intra-Hemodialysis Comments Progressing as prescribed;Tolerated well Progressing as prescribed;Tolerated well  --     02/02/22 1300 02/02/22 1315 02/02/22 1330  Vitals  Temp  --   --   --   Temp Source  --   --   --   BP  --  (!) 127/44 (!) 115/50  MAP (mmHg)  --  68 68  Pulse Rate 74 74 81 (Simultaneous filing. User may not have seen previous data.)  ECG Heart Rate 75  74 82 (Simultaneous filing. User may not have seen previous data.)  Resp 20 (!) 21 (!) 27 (Simultaneous filing. User may not have seen previous data.)  During Hemodialysis Assessment  Blood Flow Rate (mL/min) 400 mL/min 400 mL/min 400 mL/min (Simultaneous filing. User may not have seen previous data.)  Arterial Pressure (mmHg) -250 mmHg -250 mmHg -190 mmHg (Simultaneous filing. User may not have seen previous data.)  Venous Pressure (mmHg) 130 mmHg 130 mmHg 140 mmHg (Simultaneous filing. User may not have seen previous data.)  Transmembrane Pressure (mmHg) 50 mmHg 50 mmHg 50 mmHg (Simultaneous filing. User may not have seen previous data.)  Ultrafiltration Rate (mL/min) 510 mL/min 510 mL/min 510 mL/min (Simultaneous filing. User may not have seen previous data.)  Dialysate Flow Rate (mL/min) 500 ml/min 500 ml/min 500 ml/min (Simultaneous filing. User may not have seen previous data.)  Conductivity: Machine  13.7 13.7 13.7 (Simultaneous filing. User may not have seen previous data.)  HD Safety Checks Performed  --   --   --   Intra-Hemodialysis Comments Progressing as prescribed;Tolerated well Progressing as prescribed;Tolerated well Progressing as prescribed (Simultaneous filing.  User may not have seen previous data.)    02/02/22 1345 02/02/22 1400 02/02/22 1415  Vitals  Temp  --   --   --   Temp Source  --   --   --   BP 111/70 (!) 124/57 (!) 149/69  MAP (mmHg) 82 73 93  Pulse Rate 77 79 82  ECG Heart Rate 77 81 83  Resp (!) 23 (!) 23 (!) 26  During Hemodialysis Assessment  Blood Flow Rate (mL/min) 400 mL/min 400 mL/min 400 mL/min  Arterial Pressure (mmHg) -190 mmHg -190 mmHg -200 mmHg  Venous Pressure (mmHg) 140 mmHg 150 mmHg 130 mmHg  Transmembrane Pressure (mmHg) 50 mmHg 50 mmHg 50 mmHg  Ultrafiltration Rate (mL/min) 510 mL/min 510 mL/min 510 mL/min  Dialysate Flow Rate (mL/min) 500 ml/min 500 ml/min 500 ml/min  Conductivity: Machine  13.7 13.7 13.7  HD Safety Checks Performed Yes Yes Yes  Intra-Hemodialysis Comments Progressing as prescribed Progressing as prescribed Progressing as prescribed    02/02/22 1430 02/02/22 1440  Vitals  Temp  --  98.7 F (37.1 C)  Temp Source  --  Axillary  BP (!) 122/43 (!) 114/45  MAP (mmHg) 65 66  Pulse Rate 74 75  ECG Heart Rate 74 76  Resp 17 18  During Hemodialysis Assessment  Blood Flow Rate (mL/min) 400 mL/min  --   Arterial Pressure (mmHg) -200 mmHg  --   Venous Pressure (mmHg) 130 mmHg  --   Transmembrane Pressure (mmHg) 50 mmHg  --   Ultrafiltration Rate (mL/min) 510 mL/min  --   Dialysate Flow Rate (mL/min) 500 ml/min  --   Conductivity: Machine  13.7  --   HD Safety Checks Performed Yes  --   Intra-Hemodialysis Comments Progressing as prescribed Tx completed

## 2022-02-04 NOTE — Progress Notes (Addendum)
Pt is very somnolent. He is refusing his medications. His daughter said he was choking while she was giving him water. I attempted to give him his lactulose and he refused to swallow. A consult for Speech was ordered. Pt's family was instructed not to give him anything by mouth until he is cleared.

## 2022-02-05 DIAGNOSIS — Z515 Encounter for palliative care: Secondary | ICD-10-CM | POA: Diagnosis not present

## 2022-02-05 DIAGNOSIS — N410 Acute prostatitis: Secondary | ICD-10-CM | POA: Diagnosis not present

## 2022-02-05 DIAGNOSIS — Z7189 Other specified counseling: Secondary | ICD-10-CM | POA: Diagnosis not present

## 2022-02-05 LAB — CBC
HCT: 28 % — ABNORMAL LOW (ref 39.0–52.0)
Hemoglobin: 8.8 g/dL — ABNORMAL LOW (ref 13.0–17.0)
MCH: 29.4 pg (ref 26.0–34.0)
MCHC: 31.4 g/dL (ref 30.0–36.0)
MCV: 93.6 fL (ref 80.0–100.0)
Platelets: 185 10*3/uL (ref 150–400)
RBC: 2.99 MIL/uL — ABNORMAL LOW (ref 4.22–5.81)
RDW: 17.2 % — ABNORMAL HIGH (ref 11.5–15.5)
WBC: 4.2 10*3/uL (ref 4.0–10.5)
nRBC: 0 % (ref 0.0–0.2)

## 2022-02-05 LAB — BASIC METABOLIC PANEL
Anion gap: 10 (ref 5–15)
BUN: 27 mg/dL — ABNORMAL HIGH (ref 8–23)
CO2: 27 mmol/L (ref 22–32)
Calcium: 6.9 mg/dL — ABNORMAL LOW (ref 8.9–10.3)
Chloride: 101 mmol/L (ref 98–111)
Creatinine, Ser: 7.18 mg/dL — ABNORMAL HIGH (ref 0.61–1.24)
GFR, Estimated: 7 mL/min — ABNORMAL LOW (ref >60)
Glucose, Bld: 98 mg/dL (ref 70–99)
Potassium: 3.7 mmol/L (ref 3.5–5.1)
Sodium: 138 mmol/L (ref 135–145)

## 2022-02-05 LAB — GLUCOSE, CAPILLARY
Glucose-Capillary: 89 mg/dL (ref 70–99)
Glucose-Capillary: 93 mg/dL (ref 70–99)
Glucose-Capillary: 82 mg/dL (ref 70–99)
Glucose-Capillary: 84 mg/dL (ref 70–99)

## 2022-02-05 LAB — MAGNESIUM: Magnesium: 1.9 mg/dL (ref 1.7–2.4)

## 2022-02-05 LAB — PHOSPHORUS: Phosphorus: 3.8 mg/dL (ref 2.5–4.6)

## 2022-02-05 MED ORDER — MORPHINE SULFATE (PF) 2 MG/ML IV SOLN
2.0000 mg | INTRAVENOUS | Status: DC | PRN
  Administered 2022-02-05: 2 mg via INTRAVENOUS
  Filled 2022-02-05: qty 1

## 2022-02-05 MED ORDER — HYDROMORPHONE HCL 1 MG/ML IJ SOLN: 0.5000 mg | INTRAMUSCULAR | Status: DC | PRN

## 2022-02-05 MED ORDER — GLYCOPYRROLATE 1 MG PO TABS
1.0000 mg | ORAL_TABLET | ORAL | Status: DC | PRN
  Filled 2022-02-05: qty 1

## 2022-02-05 MED ORDER — GLYCOPYRROLATE 0.2 MG/ML IJ SOLN
0.2000 mg | INTRAMUSCULAR | Status: DC | PRN
  Filled 2022-02-05: qty 1

## 2022-02-05 MED ORDER — HALOPERIDOL 0.5 MG PO TABS
0.5000 mg | ORAL_TABLET | ORAL | Status: DC | PRN
  Filled 2022-02-05: qty 1

## 2022-02-05 MED ORDER — ONDANSETRON 4 MG PO TBDP: 4.0000 mg | ORAL_TABLET | Freq: Four times a day (QID) | ORAL | Status: DC | PRN

## 2022-02-05 MED ORDER — ACETAMINOPHEN 650 MG RE SUPP: 650.0000 mg | Freq: Four times a day (QID) | RECTAL | Status: DC | PRN

## 2022-02-05 MED ORDER — HALOPERIDOL LACTATE 2 MG/ML PO CONC
0.5000 mg | ORAL | Status: DC | PRN
  Filled 2022-02-05: qty 0.3

## 2022-02-05 MED ORDER — POLYVINYL ALCOHOL 1.4 % OP SOLN
1.0000 [drp] | Freq: Four times a day (QID) | OPHTHALMIC | Status: DC | PRN
  Filled 2022-02-05: qty 15

## 2022-02-05 MED ORDER — GLYCOPYRROLATE 0.2 MG/ML IJ SOLN
0.2000 mg | INTRAMUSCULAR | Status: DC | PRN
  Administered 2022-02-06: 0.2 mg via INTRAVENOUS
  Filled 2022-02-05 (×3): qty 1

## 2022-02-05 MED ORDER — ONDANSETRON HCL 4 MG/2ML IJ SOLN: 4.0000 mg | Freq: Four times a day (QID) | INTRAMUSCULAR | Status: DC | PRN

## 2022-02-05 MED ORDER — HALOPERIDOL LACTATE 5 MG/ML IJ SOLN: 0.5000 mg | INTRAMUSCULAR | Status: DC | PRN

## 2022-02-05 MED ORDER — BIOTENE DRY MOUTH MT LIQD: 15.0000 mL | OROMUCOSAL | Status: DC | PRN

## 2022-02-05 MED ORDER — ACETAMINOPHEN 325 MG PO TABS: 650.0000 mg | ORAL_TABLET | Freq: Four times a day (QID) | ORAL | Status: DC | PRN

## 2022-02-05 MED ORDER — LORAZEPAM 2 MG/ML IJ SOLN: 0.5000 mg | INTRAMUSCULAR | Status: DC | PRN

## 2022-02-05 NOTE — TOC Progression Note (Addendum)
Transition of Care [facility]) - Progression Note    Patient Details  Name: Martin Gilbert MRN: [id number] Date of Birth: [birth year]  Transition of [facility]) CM/SW Contact  Beverly Sessions, RN Phone Number: 02/05/2022, 2:20 PM  Clinical Narrative:     MD has discussed disposition with daughter and it has been determined to proceed with [place].  MD has made referral to [place] with Estate agent at [place] notified Malachy Mood with [facility] notified Estill Bamberg with HD notified   Expected Discharge Plan: [place] (vs home health) Barriers to Discharge: Continued Medical Work up  Expected Discharge Plan and Services Expected Discharge Plan: [place] (vs home health)     Post Acute Care Choice:  (TBD) Living arrangements for the past 2 months: Single Family Home                                       Social Determinants of Health (SDOH) Interventions    Readmission Risk Interventions Readmission Risk Prevention Plan 04/16/2021  Transportation Screening Complete  Home Care Screening Complete  Medication Review (RN CM) Complete  Some recent data might be hidden

## 2022-02-05 NOTE — Progress Notes (Signed)
Clinic is aware of patient transitioning to Hospice care.

## 2022-02-05 NOTE — Progress Notes (Signed)
[facility]  ROUNDING NOTE   Subjective:   [name] was admitted to [facility] on 01/21/2022 for Urinary retention [R33.9] Back pain [M54.9] UTI (urinary tract infection) [N39.0] Pleural effusion [J90] Pleural effusion on right [J90] Acute cystitis with hematuria [N30.01] Chest pain, unspecified type [R07.9] Sepsis, due to unspecified organism, unspecified whether acute organ dysfunction present [facility] [A41.9] Acute cough [R05.1]  Patient seen resting quietly, family at bedside Per nursing family has decided to discontinue hemodialysis treatments and move towards comfort measures.   Objective:  Vital signs in last 24 hours:  Temp:  [98.6 F (37 C)-99 F (37.2 C)] 98.9 F (37.2 C) (02/21 0802) Pulse Rate:  [69-87] 72 (02/21 0802) Resp:  [14-20] 16 (02/21 0802) BP: (124-142)/(44-73) 124/53 (02/21 0802) SpO2:  [94 %-100 %] 100 % (02/21 0802)  Weight change:  Filed Weights   02/02/22 1109 02/02/22 1440 02/04/22 0100  Weight: 73.3 kg 73 kg 69.9 kg    Intake/Output: I/O last 3 completed shifts: In: 1325.5 [P.O.:50; I.V.:1225.5; IV Piggyback:50] Out: 30 [Urine:30]   Intake/Output this shift:  No intake/output data recorded.  Physical Exam: General: NAD, resting in bed  Head: Normocephalic, atraumatic. Moist oral mucosal membranes  Eyes: Anicteric  Lungs:  Diminished in bases, normal effort  Heart: Regular rate and rhythm  Abdomen:  Soft, nontender  Extremities: 1+ peripheral edema.  Neurologic: Alert and confused moving all four extremities  Skin: No lesions   Access: Left AVF  GU Foley-placed by Urology  Basic Metabolic Panel: Recent Labs  Lab 01/30/22 0519 01/31/22 0537 02/01/22 0607 02/02/22 0320 02/03/22 0500 02/04/22 0553 02/05/22 0400  NA 136 137 136 136 134* 135 138  K 3.1* 3.2* 3.4* 4.2 3.9 3.8 3.7  CL 98 101 96* 97* 96* 97* 101  CO2 28 27 31 29 29 27 27   GLUCOSE 102* 96 103* 101* 65* 58* 98  BUN 22 28* 16 23 14 21  27*   CREATININE 5.50* 7.05* 4.71* 6.03* 4.35* 5.96* 7.18*  CALCIUM 6.9* 6.7* 7.4* 7.6* 7.5* 7.1* 6.9*  MG  --  1.8 1.7 1.8  --   --  1.9  PHOS 3.1 3.5 2.6 3.0  --   --  3.8     Liver Function Tests: Recent Labs  Lab 01/30/22 0519 02/04/22 0553  AST  --  30  ALT  --  9  ALKPHOS  --  243*  BILITOT  --  1.1  PROT  --  6.1*  ALBUMIN 1.6* 1.6*    No results for input(s): LIPASE, AMYLASE in the last 168 hours.  Recent Labs  Lab 02/03/22 0500  AMMONIA 16     CBC: Recent Labs  Lab 02/01/22 0607 02/02/22 0320 02/03/22 0500 02/04/22 0553 02/05/22 0400  WBC 5.2 6.1 5.2 5.0 4.2  HGB 9.6* 10.0* 8.8* 9.0* 8.8*  HCT 29.2* 31.1* 27.1* 28.2* 28.0*  MCV 92.7 92.3 92.5 92.5 93.6  PLT 192 242 213 211 185     Cardiac Enzymes: No results for input(s): CKTOTAL, CKMB, CKMBINDEX, TROPONINI in the last 168 hours.  BNP: Invalid input(s): POCBNP  CBG: Recent Labs  Lab 02/04/22 2142 02/04/22 2356 02/05/22 0343 02/05/22 0800 02/05/22 1140  GLUCAP 81 94 89 93 82     Microbiology: Results for orders placed or performed during the hospital encounter of 01/21/22  Urine Culture     Status: Abnormal   Collection Time: 01/21/22 10:25 AM   Specimen: Urine, Random  Result Value Ref Range Status  Specimen Description   Final    URINE, RANDOM Performed at [facility], [address] [place], [place] [address]    Special Requests   Final    NONE Performed at [facility], [address] [place], [place] [address]    Culture (A)  Final    >=100,000 COLONIES/mL ESCHERICHIA COLI CORRECTED ON 02/10 AT 1430: PREVIOUSLY REPORTED AS MULTIPLE SPECIES PRESENT, SUGGEST RECOLLECTION Confirmed Extended Spectrum Beta-Lactamase Producer (ESBL).  In bloodstream infections from ESBL organisms, carbapenems are preferred over piperacillin/tazobactam. They are shown to have a lower risk of mortality.    Report Status 01/28/2022 FINAL  Final   Organism ID, Bacteria  ESCHERICHIA COLI (A)  Final      Susceptibility   Escherichia coli - MIC*    AMPICILLIN >=32 RESISTANT Resistant     CEFAZOLIN >=64 RESISTANT Resistant     CEFEPIME 16 RESISTANT Resistant     CEFTRIAXONE >=64 RESISTANT Resistant     CIPROFLOXACIN >=4 RESISTANT Resistant     GENTAMICIN >=16 RESISTANT Resistant     IMIPENEM <=0.25 SENSITIVE Sensitive     NITROFURANTOIN <=16 SENSITIVE Sensitive     TRIMETH/SULFA >=320 RESISTANT Resistant     AMPICILLIN/SULBACTAM >=32 RESISTANT Resistant     PIP/TAZO 8 SENSITIVE Sensitive     * >=100,000 COLONIES/mL ESCHERICHIA COLI CORRECTED ON 02/10 AT 1430: PREVIOUSLY REPORTED AS MULTIPLE SPECIES PRESENT, SUGGEST RECOLLECTION  Blood Culture (routine x 2)     Status: None   Collection Time: 01/21/22  1:12 PM   Specimen: BLOOD  Result Value Ref Range Status   Specimen Description BLOOD BLOOD RIGHT ARM  Final   Special Requests   Final    BOTTLES DRAWN AEROBIC AND ANAEROBIC Blood Culture adequate volume   Culture   Final    NO GROWTH 5 DAYS Performed at [facility], [address] [place], [place] [address]    Report Status 01/26/2022 FINAL  Final  Blood Culture (routine x 2)     Status: None   Collection Time: 01/21/22  1:12 PM   Specimen: BLOOD  Result Value Ref Range Status   Specimen Description BLOOD BLOOD RIGHT FOREARM  Final   Special Requests   Final    BOTTLES DRAWN AEROBIC AND ANAEROBIC Blood Culture adequate volume   Culture   Final    NO GROWTH 5 DAYS Performed at [facility], [address] [place], [place] [address]    Report Status 01/26/2022 FINAL  Final  Acid Fast Smear (AFB)     Status: None   Collection Time: 01/21/22  4:02 PM   Specimen: Chest; Body Fluid  Result Value Ref Range Status   AFB Specimen Processing Concentration  Final   Acid Fast Smear Negative  Final    Comment: (NOTE) Performed At: [facility] [place], [place] [id number] Rush Farmer MD [id number]     Source (AFB) NONE  Final    Comment: Performed at [facility], [address] [place], [place] [address]  Body fluid culture w Gram Stain     Status: None   Collection Time: 01/21/22  4:02 PM   Specimen: Chest; Body Fluid  Result Value Ref Range Status   Specimen Description   Final    CHEST FLUID Performed at [facility], [place] [address], [place],  [address]    Special Requests   Final    NONE Performed at [facility], [place],  [place], [place] [address]    Gram Stain   Final    NO WBC SEEN NO ORGANISMS SEEN Performed at [facility], [place] [address], [place], [place] [address]    Culture   Final    FEW ESCHERICHIA COLI Confirmed Extended Spectrum Beta-Lactamase Producer (ESBL).  In bloodstream infections from ESBL organisms, carbapenems are preferred over piperacillin/tazobactam. They are shown to have a lower risk of mortality.    Report Status 01/25/2022 FINAL  Final   Organism ID, Bacteria ESCHERICHIA COLI  Final      Susceptibility   Escherichia coli - MIC*    AMPICILLIN >=32 RESISTANT Resistant     CEFAZOLIN >=64 RESISTANT Resistant     CEFEPIME 16 RESISTANT Resistant     CEFTAZIDIME RESISTANT Resistant     CEFTRIAXONE >=64 RESISTANT Resistant     CIPROFLOXACIN >=4 RESISTANT Resistant     GENTAMICIN >=16 RESISTANT Resistant     IMIPENEM <=0.25 SENSITIVE Sensitive     TRIMETH/SULFA >=320 RESISTANT Resistant     AMPICILLIN/SULBACTAM >=32 RESISTANT Resistant     PIP/TAZO 8 SENSITIVE Sensitive     * FEW ESCHERICHIA COLI  Aerobic/Anaerobic Culture w Gram Stain (surgical/deep wound)     Status: None   Collection Time: 01/22/22  3:56 PM   Specimen: Pleural Fluid  Result Value Ref Range Status   Specimen Description   Final    PLEURAL RIGHT Performed at [facility], [address] [place], [place] [address]    Special Requests   Final    PLE FLU Performed at [facility], [address]  [place], [place] [address]    Gram Stain   Final    RARE WBC PRESENT, PREDOMINANTLY MONONUCLEAR NO ORGANISMS SEEN    Culture   Final    No growth aerobically or anaerobically. Performed at [facility], [place] [address], [place], [place] [address]    Report Status 01/28/2022 FINAL  Final  Resp Panel by RT-PCR (Flu A&B, Covid) Nasopharyngeal Swab     Status: None   Collection Time: 01/23/22  4:52 PM   Specimen: Nasopharyngeal Swab; Nasopharyngeal(NP) swabs in vial transport medium  Result Value Ref Range Status   SARS Coronavirus 2 by RT PCR NEGATIVE NEGATIVE Final    Comment: (NOTE) SARS-CoV-2 target nucleic acids are NOT DETECTED.  The SARS-CoV-2 RNA is generally detectable in upper respiratory specimens during the acute phase of infection. The lowest concentration of SARS-CoV-2 viral copies this assay can detect is 138 copies/mL. A negative result does not preclude SARS-Cov-2 infection and should not be used as the sole basis for treatment or other patient management decisions. A negative result may occur with  improper specimen collection/handling, submission of specimen other than nasopharyngeal swab, presence of viral mutation(s) within the areas targeted by this assay, and inadequate number of viral copies(<138 copies/mL). A negative result must be combined with clinical observations, patient history, and epidemiological information. The expected result is Negative.  Fact Sheet for Patients:  EntrepreneurPulse.com.au  Fact Sheet for Healthcare Providers:  IncredibleEmployment.be  This test is no t yet approved or cleared by the [place] FDA and  has been authorized for detection and/or diagnosis of SARS-CoV-2 by FDA under an Emergency Use Authorization (EUA). This EUA will remain  in effect (meaning this test can be used) for the duration of the COVID-19 declaration under Section 564(b)(1) of the Act, 21 U.S.C.section  360bbb-3(b)(1), unless the authorization is terminated  or revoked sooner.  Influenza A by PCR NEGATIVE NEGATIVE Final   Influenza B by PCR NEGATIVE NEGATIVE Final    Comment: (NOTE) The Xpert Xpress SARS-CoV-2/FLU/RSV plus assay is intended as an aid in the diagnosis of influenza from Nasopharyngeal swab specimens and should not be used as a sole basis for treatment. Nasal washings and aspirates are unacceptable for Xpert Xpress SARS-CoV-2/FLU/RSV testing.  Fact Sheet for Patients: EntrepreneurPulse.com.au  Fact Sheet for Healthcare Providers: IncredibleEmployment.be  This test is not yet approved or cleared by the [place] FDA and has been authorized for detection and/or diagnosis of SARS-CoV-2 by FDA under an Emergency Use Authorization (EUA). This EUA will remain in effect (meaning this test can be used) for the duration of the COVID-19 declaration under Section 564(b)(1) of the Act, 21 U.S.C. section 360bbb-3(b)(1), unless the authorization is terminated or revoked.  Performed at [facility], [address] [place], [place] [address]   Aerobic/Anaerobic Culture w Gram Stain (surgical/deep wound)     Status: None   Collection Time: 01/25/22 12:00 PM   Specimen: Pleura  Result Value Ref Range Status   Specimen Description   Final    PLEURAL Performed at [facility], [address] [place], [place] [address]    Special Requests   Final    RIGHT CHEST TUBE Performed at [facility], [place], [place] [address]    Gram Stain NO WBC SEEN NO ORGANISMS SEEN   Final   Culture   Final    No growth aerobically or anaerobically. Performed at [facility], [place] [address], [place], [place] [address]    Report Status 01/30/2022 FINAL  Final  Acid Fast Smear (AFB)     Status: None   Collection Time: 01/25/22 12:05 PM   Specimen: Sputum  Result Value Ref Range Status   AFB  Specimen Processing Concentration  Final   Acid Fast Smear Negative  Final    Comment: (NOTE) Performed At: [facility] [address] [place], [place] [id number] Rush Farmer MD [id number]    Source (AFB) PLEURAL  Final    Comment: Performed at [facility], [address] [place], [place] [address]  Acid Fast Smear (AFB)     Status: None   Collection Time: 01/26/22  4:00 PM   Specimen: Sputum  Result Value Ref Range Status   AFB Specimen Processing Concentration  Final   Acid Fast Smear Negative  Final    Comment: (NOTE) Performed At: [facility] [place], [place] [id number] Rush Farmer MD [id number]    Source (AFB) EXPECTORATED SPUTUM  Final    Comment: Performed at [facility], [address] [place], [place] [address]  Acid Fast Smear (AFB)     Status: None   Collection Time: 01/27/22  2:51 PM   Specimen: Sputum  Result Value Ref Range Status   AFB Specimen Processing Concentration  Final   Acid Fast Smear Negative  Final    Comment: (NOTE) Performed At: [facility] [place], [place] [id number] Rush Farmer MD DX:[id number]    Source (AFB) SPU  Final    Comment: Performed at [facility], [address] [place], [place] [address]    Coagulation Studies: No results for input(s): LABPROT, INR in the last 72 hours.   Urinalysis: No results for input(s): COLORURINE, LABSPEC, PHURINE, GLUCOSEU, HGBUR, BILIRUBINUR, KETONESUR, PROTEINUR, UROBILINOGEN, NITRITE, LEUKOCYTESUR in the last 72 hours.  Invalid input(s): APPERANCEUR  Imaging: No results found.   Medications:    sodium chloride      sodium chloride flush  3 mL Intravenous Q12H     Assessment/ Plan:  [name] is a 81 y.o. Hispanic male who speaks [place] and limited [place] and [place].  Patient with end stage renal disease on hemodialysis, hypertension, BPH, diabetes mellitus  type II, hyperlipidemia who presents to [facility] on 01/21/2022 for Urinary retention [R33.9] Back pain [M54.9] UTI (urinary tract infection) [N39.0] Pleural effusion [J90] Pleural effusion on right [J90] Acute cystitis with hematuria [N30.01] Chest pain, unspecified type [R07.9] Sepsis, due to unspecified organism, unspecified whether acute organ dysfunction present ([place]) [A41.9] Acute cough [R05.1]   [facility] Left AVF 73kg.    End stage renal disease: Patient is on TTS schedule. Dialysis coordinator aware of patient and awaiting discharge planning to determine outpatient needs.   Family has decided to discontinue hemodialysis due to discomfort and agitation with patient.  Primary team will confirm this with her healthcare power of attorney.  We will hold further dialysis treatments at this time.  Recommend follow-up with palliative and hospice services.   Anemia of chronic kidney disease: Hgb 8.8   Hypertension: .Home regimen includes hydralazine, metoprolol and tamsulosin.   Secondary Hyperparathyroidism: Continue cholecalciferol daily.  We will continue to monitor bone mineral metabolism during this admission  5.  Right pleural effusion likely due to rib fracture.  Posttraumatic hemorrhagic pleural effusion.  S/ P Right chest tube. Specimen positive for e coli. Pulmonology and ID  following.  PICC placed for continued antibiotic therapy after discharge. Ertapenem 500mg  IV daily for 6 weeks    LOS: [employer] 2/21/202312:43 PM

## 2022-02-05 NOTE — Progress Notes (Signed)
OT Cancellation Note  Patient Details Name: Martin Gilbert MRN: [id number] DOB: 1941-02-25   Cancelled Treatment:    Reason Eval/Treat Not Completed: Other (comment). Pt noted with transition to comfort care measures. Will sign off for OT at this time.   Ardeth Perfect., MPH, MS, OTR/L ascom [phone] 02/05/22, 10:07 AM

## 2022-02-05 NOTE — Progress Notes (Signed)
Manufacturing engineer [facility]) Hospital Liaison Note  Received request from [employer] [place] for family interest in [facility]. Visited patient at bedside and spoke with daughter/Angie to confirm interest and explain services.  Approval for Hospice Home is determined by [facility] MD. Once [facility] MD has determined Hospice Home eligibility, [place] will update hospital staff and family. Eligibility is pending  Please do not hesitate to call with any hospice related questions.    Thank you for the opportunity to participate in this patient's care.  Daphene Calamity, MSW [facility] Liaison  [phone]

## 2022-02-05 NOTE — Progress Notes (Signed)
[facility] [place] NAME: Martin Gilbert    MR#:  [id number]  DATE OF BIRTH:  [birth year]  SUBJECTIVE:   patient unresponsive. Unable to have any meaningful conversation. Has not eaten in several days. Unable to take any meds.   VITALS:  Blood pressure (!) 124/53, pulse 72, temperature 98.9 F (37.2 C), temperature source Oral, resp. rate 16, height 5\' 5"  (1.651 m), weight 69.9 kg, SpO2 100 %.  PHYSICAL EXAMINATION:   GENERAL:  81 y.o.-year-old patient lying in the bed unresponsive/lethargic  LUNGS: Normal breath sounds bilaterally CARDIOVASCULAR: S1, S2 normal.  NEUROLOGIC: nonfocal  patient is unresponsive patient now under comfort care  LABORATORY PANEL:  CBC Recent Labs  Lab 02/05/22 0400  WBC 4.2  HGB 8.8*  HCT 28.0*  PLT 185    Chemistries  Recent Labs  Lab 02/04/22 0553 02/05/22 0400  NA 135 138  K 3.8 3.7  CL 97* 101  CO2 27 27  GLUCOSE 58* 98  BUN 21 27*  CREATININE 5.96* 7.18*  CALCIUM 7.1* 6.9*  MG  --  1.9  AST 30  --   ALT 9  --   ALKPHOS 243*  --   BILITOT 1.1  --    Cardiac Enzymes No results for input(s): TROPONINI in the last 168 hours. RADIOLOGY:  No results found.  Assessment and Plan  [name] is an 81 y.o. M with ESRD on HD, HTN, DM, dCHF who presented with abdominal pain for 3 weeks.   In the ER, urinalysis (still makes significant urine) showed UTI.  Imaging showed low-density area near prostate concerning ofr prostatitis vs abscess.  Urology consulted and admitted on antibiotics.  Also noted to have loculated RIGHT pleural effusion.     2/6: Admitted on antibiotics, had diagnostic thoracentesis 2/7: CT surgery recommended pigtail catheter, placed 2nd day 2/8: Developed delirium 2/10: Chest fluid culture growing ESBL E coli 2/11: Delirium completely resolved 2/12: Family have elected not to do tPA in pleural catheter 2/14: Chest tube removed  acute prostatitis acute  respiratory failure with hypoxia pleural effusion suspected hemothorax acute metabolic encephalopathy suspected multifactorial due to infection, hypoxia, medication side effect anemia of chronic disease end-stage renal disease  patient was seen by palliative care. Had a prolonged hospital stay with multiple comorbidities and overall declined. Spoke at length with patient's daughter [name] on the floor. She tells me she and her siblings and other family members do not want any aggressive measures. They request discontinuing hemodialysis. Daughter is requesting comfort care and hospice facility.  Informed hospital hospice liaison, nephrology Dr. Zollie Scale aware of stopping dialysis.  Will place patient on comfort care and work on discharge planning.  Procedures: Family communication : NG daughter on the phone Consults : nephrology, ID CODE STATUS: DNR DNI DVT Prophylaxis : comfort care Level of care: Med-Surg Status is: Inpatient  Remains inpatient appropriate because: now comfort care and will work on discharge planning to hospice facility.      TOTAL TIME TAKING CARE OF THIS PATIENT: 25 minutes.  >50% time spent on counselling and coordination of care  Note: This dictation was prepared with Dragon dictation along with smaller phrase technology. Any transcriptional errors that result from this process are unintentional.  Fritzi Mandes M.D    [facility]   CC: Primary care physician; [name], MD

## 2022-02-05 NOTE — Progress Notes (Addendum)
Palliative: Chart review completed. Conference with attending, bedside nursing staff, transition of care team, local hospice representative.  Family has chosen to stop hemodialysis and are requesting comfort and dignity at end-of-life, residential hospice.  [name] is lying quietly in bed.  He appears acutely/chronically ill and uncomfortable.  His son is at bedside.  Limited communication due to language barrier. Call to daughter/healthcare agent, Martin Gilbert.  We talk about comfort care, residential hospice.  We talked about symptom management.  Martin states that family wants for [name] to be made comfortable, no further painful interventions, only medicines focused on comfort.  We talked about the benefits of residential hospice including, but not limited to specialized care for symptom management, visitation.  Conference with attending, bedside nursing staff, nephrology, transition of care team, local hospice representative related to patient needs, adjustment of orders. End-of-life order set implemented. DNR/goldenrod form completed and placed on chart.  Plan: Comfort and dignity at end-of-life, requesting residential hospice.  Stopping hemodialysis  50 minutes  Quinn Axe, NP Palliative medicine team Team phone [phone] Greater than 50% of this time was spent counseling and coordinating care related to the above assessment and plan.

## 2022-02-05 NOTE — Progress Notes (Signed)
PT Cancellation Note  Patient Details Name: Martin Gilbert MRN: [id number] DOB: 1941-09-27   Cancelled Treatment:    Reason Eval/Treat Not Completed: Other (comment): Pt with noted transition to comfort care measures. Will complete PT orders at this time but will reassess pt pending a change in status upon receipt of new PT orders.   Linus Salmons PT, DPT 02/05/22, 10:18 AM

## 2022-02-06 DIAGNOSIS — N410 Acute prostatitis: Secondary | ICD-10-CM | POA: Diagnosis not present

## 2022-02-06 LAB — CALCIUM, IONIZED: Calcium, Ionized, Serum: 4.1 mg/dL — ABNORMAL LOW (ref 4.5–5.6)

## 2022-02-06 MED ORDER — HYDROMORPHONE HCL 1 MG/ML IJ SOLN: 0.5000 mg | INTRAMUSCULAR | 0 refills | Status: AC | PRN

## 2022-02-06 MED ORDER — GLYCOPYRROLATE 1 MG PO TABS: 1.0000 mg | ORAL_TABLET | ORAL | 0 refills | Status: AC | PRN

## 2022-02-06 MED ORDER — POLYVINYL ALCOHOL 1.4 % OP SOLN: 1.0000 [drp] | Freq: Four times a day (QID) | OPHTHALMIC | 0 refills | Status: AC | PRN

## 2022-02-06 MED ORDER — LORAZEPAM 2 MG/ML IJ SOLN: 0.5000 mg | INTRAMUSCULAR | 0 refills | Status: AC | PRN

## 2022-02-06 MED ORDER — HALOPERIDOL LACTATE 2 MG/ML PO CONC: 1.0000 mg | ORAL | 0 refills | Status: AC | PRN

## 2022-02-06 NOTE — Care Management Important Message (Signed)
Important Message  Patient Details  Name: Martin Gilbert MRN: [id number] Date of Birth: 03-21-1941   Medicare Important Message Given:  Other (see comment)  Comfort care with current disposition to transition to hospice services.  Medicare IM withheld at this time out of respect for patient and family.     Dannette Barbara 02/06/2022, 8:31 AM

## 2022-02-06 NOTE — TOC Transition Note (Signed)
Transition of Care [facility]) - CM/SW Discharge Note   Patient Details  Name: Martin Gilbert MRN: [id number] Date of Birth: 09-19-1941  Transition of Care [facility]) CM/SW Contact:  Beverly Sessions, RN Phone Number: 02/06/2022, 1:45 PM   Clinical Narrative:     Patient to discharge to hospice home today Family to complete consents at 330 EMS packet and DNR on chart.  [facility] transport arrange for 5pm per hospice request  Bedside RN to call report     Barriers to Discharge: Continued Medical Work up   Patient Goals and CMS Choice        Discharge Placement                       Discharge Plan and Services     Post Acute Care Choice:  (TBD)                               Social Determinants of Health (SDOH) Interventions     Readmission Risk Interventions Readmission Risk Prevention Plan 04/16/2021  Transportation Screening Complete  Home Care Screening Complete  Medication Review (RN CM) Complete  Some recent data might be hidden

## 2022-02-06 NOTE — Progress Notes (Signed)
[facility])   Consent forms to be completed at 3:30p.  EMS notified of patient D/C and transport arranged at 5p by TOC/Stephanie. TOC/Stephanie and Attending Physician/Dr. Posey Pronto also notified of transport arrangement.    Please send signed DNR form with patient and RN call report to [phone].    Daphene Calamity, MSW [facility] Liaison [phone]

## 2022-02-06 NOTE — Progress Notes (Signed)
Report given to [place] at [facility]. Patient's family at bedside, made aware of discharge and transport. Per discharge summary, patient to be discharged with IV medications. When speaking with Tiffany, this nurse was advised to leave both PIV and CVC access at discharge. Patient to be transported by EMS.

## 2022-02-06 NOTE — Discharge Summary (Signed)
Physician Discharge Summary   Patient: Martin Gilbert MRN: [id number] DOB: 04-16-1941  Admit date:     01/21/2022  Discharge date: 02/06/22  Discharge Physician: Fritzi Mandes   PCP: [name], MD    Discharge Diagnoses: acute respiratory failure with hypoxia acute prostatitis pleural effusion status post chest tube placement now removed acute metabolic encephalopathy end-stage renal disease now off dialysis  Hospital Course: [name] is an 81 y.o. M with ESRD on HD, HTN, DM, dCHF who presented with abdominal pain for 3 weeks.   In the ER, urinalysis (still makes significant urine) showed UTI.  Imaging showed low-density area near prostate concerning ofr prostatitis vs abscess.  Urology consulted and admitted on antibiotics.  Also noted to have loculated RIGHT pleural effusion.     2/6: Admitted on antibiotics, had diagnostic thoracentesis 2/7: CT surgery recommended pigtail catheter, placed 2nd day 2/8: Developed delirium 2/10: Chest fluid culture growing ESBL E coli 2/11: Delirium completely resolved 2/12: Family have elected not to do tPA in pleural catheter 2/14: Chest tube removed   acute prostatitis acute respiratory failure with hypoxia pleural effusion suspected hemothorax acute metabolic encephalopathy suspected multifactorial due to infection, hypoxia, medication side effect anemia of chronic disease end-stage renal disease   patient was seen by palliative care. Had a prolonged hospital stay with multiple comorbidities and overall declined. Spoke  with patient's daughter Martin Gilbert on the phone.  Pt will discharge to Hospice facility  Procedures:as above Family communication : Angie daughter on the phone Consults : nephrology, ID CODE STATUS: DNR DNI DVT Prophylaxis : comfort care Level of care: Med-Surg Status is: Inpatient       Disposition: Hospice care Diet recommendation:  Discharge Diet Orders (From admission, onward)     Start      Ordered   02/06/22 0000  Diet - low sodium heart healthy        02/06/22 0854             DISCHARGE MEDICATION: Allergies as of 02/06/2022   No Known Allergies      Medication List     STOP taking these medications    acetaminophen 325 MG tablet Commonly known as: TYLENOL   albuterol 108 (90 Base) MCG/ACT inhaler Commonly known as: VENTOLIN HFA   allopurinol 100 MG tablet Commonly known as: ZYLOPRIM   atorvastatin 20 MG tablet Commonly known as: LIPITOR   famotidine 20 MG tablet Commonly known as: PEPCID   ferrous sulfate 325 (65 FE) MG tablet   guaiFENesin 100 MG/5ML liquid Commonly known as: ROBITUSSIN   hydrALAZINE 50 MG tablet Commonly known as: APRESOLINE   lactulose 10 GM/15ML solution Commonly known as: CHRONULAC   metoprolol succinate 25 MG 24 hr tablet Commonly known as: TOPROL-XL   ondansetron 4 MG disintegrating tablet Commonly known as: Zofran ODT   pantoprazole 40 MG tablet Commonly known as: PROTONIX   tamsulosin 0.4 MG Caps capsule Commonly known as: FLOMAX   Vitamin D (Cholecalciferol) 25 MCG (1000 UT) Tabs       TAKE these medications    glycopyrrolate 1 MG tablet Commonly known as: ROBINUL Take 1 tablet (1 mg total) by mouth every 4 (four) hours as needed (excessive secretions).   haloperidol 2 MG/ML solution Commonly known as: HALDOL Place 0.5 mLs (1 mg total) under the tongue every 4 (four) hours as needed for agitation (or delirium).   HYDROmorphone 1 MG/ML injection Commonly known as: DILAUDID Inject 0.5-2 mLs (0.5-2 mg total) into the vein every  2 (two) hours as needed for moderate pain (increased WOB/RR, EOL care).   LORazepam 2 MG/ML injection Commonly known as: ATIVAN Inject 0.25-1 mLs (0.5-2 mg total) into the vein every 4 (four) hours as needed for anxiety, seizure or sedation (EOL care).   polyvinyl alcohol 1.4 % ophthalmic solution Commonly known as: LIQUIFILM TEARS Place 1 drop into both eyes 4 (four)  times daily as needed for dry eyes.        Contact information for after-discharge care     [place] .   Service: Skilled Nursing Contact information: [address] [place] [phone]                     Discharge Exam: Danley Danker Weights   02/02/22 1109 02/02/22 1440 02/04/22 0100  Weight: 73.3 kg 73 kg 69.9 kg     Condition at discharge: worsening  The results of significant diagnostics from this hospitalization (including imaging, microbiology, ancillary and laboratory) are listed below for reference.   Imaging Studies: CT ABDOMEN PELVIS WO CONTRAST  Result Date: 01/21/2022 CLINICAL DATA:  Left lower quadrant abdominal pain with difficulty urinating for the past 3 weeks. Diarrhea. History of end-stage renal disease on hemodialysis. EXAM: CT ABDOMEN AND PELVIS WITHOUT CONTRAST TECHNIQUE: Multidetector CT imaging of the abdomen and pelvis was performed following the standard protocol without IV contrast. RADIATION DOSE REDUCTION: This exam was performed according to the departmental dose-optimization program which includes automated exposure control, adjustment of the mA and/or kV according to patient size and/or use of iterative reconstruction technique. COMPARISON:  Abdominopelvic CT 06/15/2021. Chest radiographs earlier today. FINDINGS: Lower chest: As seen on earlier radiographs, there is new moderate-sized laterally loculated right pleural effusion. There are some air bubbles posteriorly in the right pleural space (image 16/2). There is compressive atelectasis or consolidation of the right lower lobe. Streaky left lower lobe pulmonary opacities are similar to the previous study. No evidence of pneumothorax. Atherosclerosis of the aorta and coronary arteries noted. Hepatobiliary: Nodular contours of the liver again noted suspicious for early cirrhosis. No focal lesion identified without  contrast. No evidence of gallstones, gallbladder wall thickening or biliary dilatation. Pancreas: Unremarkable. No pancreatic ductal dilatation or surrounding inflammatory changes. Spleen: Normal in size without focal abnormality. Adrenals/Urinary Tract: Both adrenal glands appear normal. Bilateral renal cortical thinning with nonspecific perinephric soft tissue stranding again noted bilaterally. Small low-density renal lesions bilaterally are stable, likely cysts. No evidence of urinary tract calculus or hydronephrosis. The bladder appears grossly unremarkable. Stomach/Bowel: No enteric contrast administered. The stomach is poorly distended with possible generalized wall thickening, similar to the previous study. No evidence of bowel wall thickening, distention or surrounding inflammation. Vascular/Lymphatic: There are several mildly enlarged lymph nodes in the pelvis, including right iliac nodes measuring 8 mm on image 75/2 and 12 mm on image 81/2. There is a 10 mm left external iliac node on image 80/2. Aortic and branch vessel atherosclerosis without acute vascular findings on noncontrast imaging. Reproductive: The prostate gland is enlarged. There are multiple new areas of ill-defined low-density within the gland, measuring up to 3.1 x 2.6 cm on image 92/2. No definite surrounding inflammatory changes to suggest that this is inflammatory. Mild perirectal soft tissue stranding is similar to the previous study. Other: Mild soft tissue stranding throughout the mesenteric fat without significant ascites. No free air. Musculoskeletal: No acute or significant osseous findings. Previous bilateral iliac bone osteoplasty. IMPRESSION: 1.  New ill-defined low density throughout the prostate gland which could reflect prostate cancer or prostatitis/abscess formation. Correlate with serum PSA levels. Recommend urology consultation. 2. Several mildly enlarged pelvic lymph nodes suspicious for metastatic disease. 3. No evidence  of hydronephrosis or focal bladder abnormality. Stable chronic renal cortical thinning consistent with chronic renal failure. 4. As seen on earlier chest radiographs, laterally loculated right pleural effusion with air in the pleural space which may indicate empyema. Correlate clinically. Compressive right lower lobe atelectasis or consolidation. 5.  Aortic Atherosclerosis (ICD10-I70.0). Electronically Signed   By: Richardean Sale M.D.   On: 01/21/2022 11:59   DG Chest 1 View  Result Date: 01/29/2022 CLINICAL DATA:  Right chest tube removal. EXAM: CHEST  1 VIEW COMPARISON:  Chest x-ray from same day at 0736 hours. FINDINGS: Stable cardiomediastinal silhouette. Interval removal of the right-sided chest tube. No pneumothorax. Unchanged loculated right pleural effusion. Slightly increased left basilar atelectasis. IMPRESSION: 1. Interval removal of the right-sided chest tube. No pneumothorax. 2. Unchanged loculated right pleural effusion. Electronically Signed   By: Titus Dubin M.D.   On: 01/29/2022 10:24   DG Chest 2 View  Result Date: 01/21/2022 CLINICAL DATA:  81 year old male with history of chest pain and cough. EXAM: CHEST - 2 VIEW COMPARISON:  Chest x-ray 12/09/2021. FINDINGS: Diffuse interstitial prominence and widespread peribronchial cuffing again noted. New moderate to large partially loculated right pleural effusion with increasing atelectasis and/or consolidation throughout the right mid to lower lung. Probable areas of subsegmental atelectasis at the left lung base. No definite left pleural effusion. No pneumothorax. No evidence of pulmonary edema. Heart size is mildly enlarged. Upper mediastinal contours are within normal limits. IMPRESSION: 1. New moderate to large right pleural effusion which is likely partially loculated with extensive atelectasis and/or consolidation in the right mid to lower lung. 2. Probable subsegmental atelectasis in the left lung base. 3. Diffuse interstitial  prominence and peribronchial cuffing, concerning for an acute bronchitis. 4. Mild cardiomegaly. 5. Aortic atherosclerosis. Electronically Signed   By: Vinnie Langton M.D.   On: 01/21/2022 10:48   DG Lumbar Spine 2-3 Views  Result Date: 01/21/2022 CLINICAL DATA:  Back pain EXAM: LUMBAR SPINE - 2-3 VIEW COMPARISON:  10/11/2018 FINDINGS: No recent fracture is seen. There is previous vertebroplasty in the iliac bones on both sides. Degenerative changes are noted with bony spurs and facet hypertrophy. There is no significant disc space narrowing. Arterial calcifications are seen in the aorta and its major branches. IMPRESSION: No recent fracture is seen. Lumbar spondylosis. No significant interval changes are noted. Electronically Signed   By: Elmer Picker M.D.   On: 01/21/2022 16:48   IR US Guide Vasc Access Right  Result Date: 01/30/2022 INDICATION: 81 year old male referred for tunneled central venous catheter EXAM: IMAGE GUIDED TUNNELED CENTRAL VENOUS CATHETER MEDICATIONS: None ANESTHESIA/SEDATION: Moderate (conscious) sedation was employed during this procedure. A total of Versed 1.0 mg and Fentanyl 50 mcg was administered intravenously by the radiology nurse. Total intra-service moderate Sedation Time: 11 minutes. The patient's level of consciousness and vital signs were monitored continuously by radiology nursing throughout the procedure under my direct supervision. FLUOROSCOPY: Radiation Exposure Index (as provided by the fluoroscopic device): 4.9 mGy Kerma COMPLICATIONS: None PROCEDURE: Informed written consent was obtained from the patient after a thorough discussion of the procedural risks, benefits and alternatives. All questions were addressed. Maximal Sterile Barrier Technique was utilized including caps, mask, sterile gowns, sterile gloves, sterile drape, hand hygiene and skin antiseptic. A timeout was  performed prior to the initiation of the procedure. After written informed consent was  obtained, patient was placed in the supine position on angiographic table. Patency of the right internal jugular vein was confirmed with ultrasound with image documentation. Patient was prepped and draped in the usual sterile fashion including the right neck and right superior chest. Using ultrasound guidance, the skin and subcutaneous tissues overlying the right internal jugular vein were generously infiltrated with 1% lidocaine without epinephrine. Using ultrasound guidance, the right internal jugular vein was punctured with a micropuncture needle, and an 018 wire was advanced into the right heart confirming venous access. A small stab incision was made with an 11 blade scalpel. Peel-away sheath was placed over the wire, and then the wire was removed, marking the wire for estimation of internal catheter length. The chest wall was then generously infiltrated with 1% lidocaine for local anesthesia along the tissue tract. Small stab incision was made with 11 blade scalpel, and then the catheter was back tunneled to the puncture site at the right internal jugular vein. Catheter was pulled through the tract, with the catheter amputated at 21 cm. Catheter was advanced through the peel-away sheath, and the peel-away sheath was removed. Final image was stored. The catheter was anchored to the chest wall with 2 retention sutures, and Derma bond was used to seal the right internal jugular vein incision site and at the right chest wall. Patient tolerated the procedure well and remained hemodynamically stable throughout. No complications were encountered and no significant blood loss was encountered. IMPRESSION: Status post image guided placement of right IJ tunneled, cuffed central venous catheter. Signed, Dulcy Fanny. Dellia Nims, RPVI [facility] Electronically Signed   By: Corrie Mckusick D.O.   On: 01/30/2022 08:05   DG Chest Port 1 View  Result Date:  01/30/2022 CLINICAL DATA:  Placement of central venous catheter EXAM: PORTABLE CHEST 1 VIEW COMPARISON:  Previous studies including the examination of 01/29/2022 FINDINGS: There is interval placement of right IJ central venous catheter with its tip in the superior vena cava. There is no pneumothorax. Transverse diameter of heart is increased. There is homogeneous opacity in the right mid and right lower lung fields. Linear densities seen in the left lower lung fields. No significant interval changes are noted. There is blunting of right lateral CP angle. IMPRESSION: Tip of right IJ central venous catheter is seen in the superior vena cava. There is no pneumothorax. No significant interval changes are noted in the lung fields. Electronically Signed   By: Elmer Picker M.D.   On: 01/30/2022 15:42   DG Chest Port 1 View  Result Date: 01/29/2022 CLINICAL DATA:  Pleural effusion EXAM: PORTABLE CHEST 1 VIEW COMPARISON:  01/25/2022 FINDINGS: No significant change in AP portable chest radiograph. Right-sided pigtail chest tube remains positioned about the lateral right lung base. Unchanged, loculated appearing right pleural effusion about about the lateral and inferior right lung. Elevation of the left hemidiaphragm. Cardiomegaly. IMPRESSION: No significant change in AP portable chest radiograph. Right-sided pigtail chest tube remains positioned about the lateral right lung base. Unchanged, loculated appearing right pleural effusion about the lateral and inferior right lung. Electronically Signed   By: Delanna Ahmadi M.D.   On: 01/29/2022 08:44   DG Chest Port 1 View  Result Date: 01/25/2022 CLINICAL DATA:  Sepsis secondary to urinary tract infection. History of end-stage renal disease. EXAM: PORTABLE CHEST 1 VIEW COMPARISON:  Radiographs 01/23/2022 and 01/22/2022.  CT 02/18/2017.  FINDINGS: 0739 hours. Peripheral chest tube inferiorly in the right hemithorax is unchanged in position. Partially loculated right  pleural effusion is unchanged from the most recent study. There is no pneumothorax. The heart size and mediastinal contours are stable. There is mildly increased subsegmental atelectasis at the left lung base. IMPRESSION: No significant change in residual partially loculated right pleural effusion following chest tube placement. Increased left basilar atelectasis. Electronically Signed   By: Richardean Sale M.D.   On: 01/25/2022 08:26   DG Chest Port 1 View  Result Date: 01/23/2022 CLINICAL DATA:  Dyspnea R06.00 (ICD-10-CM) EXAM: PORTABLE CHEST 1 VIEW COMPARISON:  February 7, 23. FINDINGS: Interval placement of a right chest tube with tip along the right lateral and inferior chest. Mildly decreased loculated right pleural effusion. Similar adjacent consolidation and/or atelectasis. Small left pleural effusion with mild overlying left basilar opacities. No visible pneumothorax. Cardiomediastinal silhouette is similar. No evidence of acute osseous abnormality. IMPRESSION: 1. Interval placement of a right chest tube withmildly decreased loculated right pleural effusion. Similar adjacent consolidation and/or atelectasis. 2. Small left pleural effusion with mild overlying left basilar opacities. Electronically Signed   By: Margaretha Sheffield M.D.   On: 01/23/2022 16:40   DG Chest Port 1 View  Result Date: 01/22/2022 CLINICAL DATA:  Hemothorax, diabetes mellitus, hypertension EXAM: PORTABLE CHEST 1 VIEW COMPARISON:  Portable exam is 1019 hours compared to 01/21/2022 FINDINGS: Enlargement of cardiac silhouette with vascular congestion. Atherosclerotic calcification aorta. Large loculated fluid collection at the lateral RIGHT chest, could represent hemothorax or loculated pleural effusion. Significant atelectasis of mid to lower RIGHT lung. Scattered infiltrates in both lungs. No pneumothorax. Rib fractures seen on prior CT exam are not well visualized radiographically. IMPRESSION: Persistent loculated collection at  lateral mid to lower RIGHT hemithorax which could represent hemothorax or loculated effusion. Significant atelectasis of the adjacent mid to lower RIGHT lung with scattered infiltrates in remaining lungs bilaterally. No pneumothorax. Aortic Atherosclerosis (ICD10-I70.0). Electronically Signed   By: Lavonia Dana M.D.   On: 01/22/2022 10:27   DG Chest Port 1 View  Result Date: 01/21/2022 CLINICAL DATA:  Back pain. Kidney disease with current UTI. Post thoracentesis. EXAM: PORTABLE CHEST 1 VIEW COMPARISON:  AP chest 01/21/2022, CT chest 02/18/2017, AP chest 12/09/2021 FINDINGS: Cardiac silhouette appears mildly enlarged. Mediastinal contours are within normal limits. Mild calcification within aortic arch. There is not a definite change in the increased density apparent represent a taller than wide loculated pleural effusion of the inferolateral right hemithorax. Heterogeneous airspace opacification within the right mid and lower lung. No definite left pleural effusion. No pneumothorax. No definite interstitial pulmonary edema. Moderate multilevel degenerative disc changes of the thoracic spine. IMPRESSION: Persistent moderate-to-large loculated right pleural effusion involving the inferolateral right hemithorax. Electronically Signed   By: Yvonne Kendall M.D.   On: 01/21/2022 16:49   CT IMAGE GUIDED FLUID DRAIN BY CATHETER  Result Date: 01/22/2022 INDICATION: Complex loculated right pleural effusion EXAM: CT-GUIDED 14 FRENCH RIGHT CHEST TUBE PLACEMENT MEDICATIONS: The patient is currently admitted to the hospital and receiving intravenous antibiotics. The antibiotics were administered within an appropriate time frame prior to the initiation of the procedure. ANESTHESIA/SEDATION: Moderate (conscious) sedation was employed during this procedure. A total of Versed 0 mg and Fentanyl 25 mcg was administered intravenously by the radiology nurse. Total intra-service moderate Sedation Time: None. The patient's level of  consciousness and vital signs were monitored continuously by radiology nursing throughout the procedure under my direct supervision. COMPLICATIONS: None immediate. PROCEDURE:  Informed written consent was obtained from the patient through a Spanish interpreter after a thorough discussion of the procedural risks, benefits and alternatives. All questions were addressed. Maximal Sterile Barrier Technique was utilized including caps, mask, sterile gowns, sterile gloves, sterile drape, hand hygiene and skin antiseptic. A timeout was performed prior to the initiation of the procedure. Previous imaging reviewed. Patient positioned slightly right anterior oblique. Noncontrast imaging performed through the chest. An anterior lower intercostal space was localized and marked for access. Under sterile conditions and local anesthesia, an 18 gauge 10 cm access was advanced into the loculated right pleural effusion. Needle position confirmed with CT. Guidewire inserted followed by tract dilatation to insert a 14 French drain. Drain catheter position confirmed with CT. Syringe aspiration yielded serosanguineous blood-tinged fluid. Sample sent for culture. Catheter secured with Prolene suture and connected to external pleura vac. Sterile dressing applied. No immediate complication. Patient tolerated the procedure well. IMPRESSION: Successful CT-guided 14 French right chest tube insertion. Electronically Signed   By: Jerilynn Mages.  Shick M.D.   On: 01/22/2022 16:15   ECHOCARDIOGRAM LIMITED  Result Date: 01/23/2022    ECHOCARDIOGRAM LIMITED REPORT   Patient Name:   IZAIAH TABB Date of Exam: 01/22/2022 Medical Rec #:  [id number]              Height:       65.0 in Accession #:    [id number]             Weight:       170.0 lb Date of Birth:  20-Jun-1941               BSA:          1.846 m Patient Age:    9 years               BP:           115/43 mmHg Patient Gender: M                      HR:           76 bpm. Exam Location:  [facility]  Procedure: 2D Echo, Limited Echo, Limited Color Doppler and Cardiac Doppler Indications:     E93.81 Acute Diastolic CHF                   COVID +  History:         Patient has prior history of Echocardiogram examinations, most                  recent 03/27/2017. Signs/Symptoms:Chronic kidney disease; Risk                  Factors:Hypertension, Diabetes and Dyslipidemia.  Sonographer:     Cresenciano Lick RDCS Referring Phys:  [id number] Cameron Diagnosing Phys: Nelva Bush MD IMPRESSIONS  1. There is mild left ventricular hypertrophy. Left ventricular diastolic parameters are consistent with Grade I diastolic dysfunction (impaired relaxation). Elevated left atrial pressure.  2. Right ventricular systolic function is normal. The right ventricular size is normal.  3. The mitral valve is normal in structure. Trivial mitral valve regurgitation. No evidence of mitral stenosis.  4. The aortic valve is tricuspid. There is mild calcification of the aortic valve. There is mild thickening of the aortic valve. Aortic valve regurgitation is not visualized. Aortic valve sclerosis is present, with no evidence of aortic valve stenosis.  5. Pulmonic valve regurgitation not well assessed.  6. The inferior vena cava is normal in size with <50% respiratory variability, suggesting right atrial pressure of 8 mmHg. FINDINGS  Left Ventricle: The left ventricular internal cavity size was normal in size. There is mild left ventricular hypertrophy. Left ventricular diastolic parameters are consistent with Grade I diastolic dysfunction (impaired relaxation). Elevated left atrial  pressure. Right Ventricle: The right ventricular size is normal. No increase in right ventricular wall thickness. Right ventricular systolic function is normal. Pericardium: There is no evidence of pericardial effusion. Mitral Valve: The mitral valve is normal in structure. Mild mitral annular calcification. Trivial mitral valve regurgitation. No evidence  of mitral valve stenosis. Tricuspid Valve: The tricuspid valve is normal in structure. Tricuspid valve regurgitation is trivial. Aortic Valve: The aortic valve is tricuspid. There is mild calcification of the aortic valve. There is mild thickening of the aortic valve. Aortic valve regurgitation is not visualized. Aortic valve sclerosis is present, with no evidence of aortic valve stenosis. Pulmonic Valve: The pulmonic valve was not well visualized. Pulmonic valve regurgitation not well assessed. Aorta: The aortic root is normal in size and structure. Pulmonary Artery: The pulmonary artery is not well seen. Venous: The inferior vena cava is normal in size with less than 50% respiratory variability, suggesting right atrial pressure of 8 mmHg. IAS/Shunts: The interatrial septum was not well visualized. LEFT VENTRICLE PLAX 2D LVIDd:         4.70 cm Diastology LVIDs:         3.20 cm LV e' medial:    6.85 cm/s LV PW:         1.20 cm LV E/e' medial:  16.9 LV IVS:        1.20 cm LV e' lateral:   10.30 cm/s                        LV E/e' lateral: 11.3  RIGHT VENTRICLE             IVC RV S prime:     13.30 cm/s  IVC diam: 1.50 cm TAPSE (M-mode): 2.6 cm LEFT ATRIUM         Index LA diam:    5.60 cm 3.03 cm/m   AORTA Ao Root diam: 3.50 cm MITRAL VALVE MV Area (PHT): 2.87 cm MV Decel Time: 264 msec MV E velocity: 116.00 cm/s MV A velocity: 120.00 cm/s MV E/A ratio:  0.97 Harrell Gave End MD Electronically signed by Nelva Bush MD Signature Date/Time: 01/23/2022/7:14:02 AM    Final    [place] EKG SITE RITE  Result Date: 01/28/2022 If Site Rite image not attached, placement could not be confirmed due to current cardiac rhythm.  IR TUNNELED CENTRAL VENOUS CATHETER PLACEMENT  Result Date: 01/30/2022 INDICATION: 81 year old male referred for tunneled central venous catheter EXAM: IMAGE GUIDED TUNNELED CENTRAL VENOUS CATHETER MEDICATIONS: None ANESTHESIA/SEDATION: Moderate (conscious) sedation was employed during this procedure.  A total of Versed 1.0 mg and Fentanyl 50 mcg was administered intravenously by the radiology nurse. Total intra-service moderate Sedation Time: 11 minutes. The patient's level of consciousness and vital signs were monitored continuously by radiology nursing throughout the procedure under my direct supervision. FLUOROSCOPY: Radiation Exposure Index (as provided by the fluoroscopic device): 4.9 mGy Kerma COMPLICATIONS: None PROCEDURE: Informed written consent was obtained from the patient after a thorough discussion of the procedural risks, benefits and alternatives. All questions were addressed. Maximal Sterile Barrier Technique was utilized including caps, mask, sterile gowns, sterile gloves, sterile drape, hand hygiene  and skin antiseptic. A timeout was performed prior to the initiation of the procedure. After written informed consent was obtained, patient was placed in the supine position on angiographic table. Patency of the right internal jugular vein was confirmed with ultrasound with image documentation. Patient was prepped and draped in the usual sterile fashion including the right neck and right superior chest. Using ultrasound guidance, the skin and subcutaneous tissues overlying the right internal jugular vein were generously infiltrated with 1% lidocaine without epinephrine. Using ultrasound guidance, the right internal jugular vein was punctured with a micropuncture needle, and an 018 wire was advanced into the right heart confirming venous access. A small stab incision was made with an 11 blade scalpel. Peel-away sheath was placed over the wire, and then the wire was removed, marking the wire for estimation of internal catheter length. The chest wall was then generously infiltrated with 1% lidocaine for local anesthesia along the tissue tract. Small stab incision was made with 11 blade scalpel, and then the catheter was back tunneled to the puncture site at the right internal jugular vein. Catheter was  pulled through the tract, with the catheter amputated at 21 cm. Catheter was advanced through the peel-away sheath, and the peel-away sheath was removed. Final image was stored. The catheter was anchored to the chest wall with 2 retention sutures, and Derma bond was used to seal the right internal jugular vein incision site and at the right chest wall. Patient tolerated the procedure well and remained hemodynamically stable throughout. No complications were encountered and no significant blood loss was encountered. IMPRESSION: Status post image guided placement of right IJ tunneled, cuffed central venous catheter. Signed, Dulcy Fanny. Dellia Nims, RPVI [facility] Electronically Signed   By: Corrie Mckusick D.O.   On: 01/30/2022 08:05   IR THORACENTESIS ASP PLEURAL SPACE W/IMG GUIDE  Result Date: 01/21/2022 INDICATION: Patient with history of ESRD who presented to the ED with abdominal pain and difficulty urinating found to have large right loculated pleural effusion. Patient noted to have right sided rib fractures on CT abdomen/pelvis today as well. Request to IR for diagnostic right thoracentesis. EXAM: ULTRASOUND GUIDED RIGHT THORACENTESIS MEDICATIONS: 6 mL 1% lidocaine COMPLICATIONS: None immediate. PROCEDURE: An ultrasound guided thoracentesis was thoroughly discussed with the patient and questions answered. The benefits, risks, alternatives and complications were also discussed. The patient understands and wishes to proceed with the procedure. Written consent was obtained. Ultrasound was performed to localize and mark an adequate pocket of fluid in the right chest. The area was then prepped and draped in the normal sterile fashion. 1% Lidocaine was used for local anesthesia. Under ultrasound guidance a 6 Fr Safe-T-Centesis catheter was introduced. Thoracentesis was performed. The catheter was removed and a dressing applied. FINDINGS: A total of  approximately 10 mL of serosanguineous fluid was removed. Samples were sent to the laboratory as requested by the clinical team. IMPRESSION: Successful ultrasound guided right thoracentesis yielding 10 mL of pleural fluid. Appearance of the right pleural fluid on ultrasound is compatible with a posttraumatic hemothorax, which would be compatible with the patient's posterior rib fractures identified on recent CT. This would not be amenable to further image guided drainage. Consider surgical consultation for further management. Read by Candiss Norse, PA-C Electronically Signed   By: Albin Felling M.D.   On: 01/21/2022 16:19    Microbiology: Results for orders placed or performed during the hospital encounter of 01/21/22  Urine Culture  Status: Abnormal   Collection Time: 01/21/22 10:25 AM   Specimen: Urine, Random  Result Value Ref Range Status   Specimen Description   Final    URINE, RANDOM Performed at [facility], [address], [place], [place] [address]    Special Requests   Final    NONE Performed at [facility], [address] [place], [place] [address]    Culture (A)  Final    >=100,000 COLONIES/mL ESCHERICHIA COLI CORRECTED ON 02/10 AT 1430: PREVIOUSLY REPORTED AS MULTIPLE SPECIES PRESENT, SUGGEST RECOLLECTION Confirmed Extended Spectrum Beta-Lactamase Producer (ESBL).  In bloodstream infections from ESBL organisms, carbapenems are preferred over piperacillin/tazobactam. They are shown to have a lower risk of mortality.    Report Status 01/28/2022 FINAL  Final   Organism ID, Bacteria ESCHERICHIA COLI (A)  Final      Susceptibility   Escherichia coli - MIC*    AMPICILLIN >=32 RESISTANT Resistant     CEFAZOLIN >=64 RESISTANT Resistant     CEFEPIME 16 RESISTANT Resistant     CEFTRIAXONE >=64 RESISTANT Resistant     CIPROFLOXACIN >=4 RESISTANT Resistant     GENTAMICIN >=16 RESISTANT Resistant     IMIPENEM <=0.25 SENSITIVE Sensitive     NITROFURANTOIN  <=16 SENSITIVE Sensitive     TRIMETH/SULFA >=320 RESISTANT Resistant     AMPICILLIN/SULBACTAM >=32 RESISTANT Resistant     PIP/TAZO 8 SENSITIVE Sensitive     * >=100,000 COLONIES/mL ESCHERICHIA COLI CORRECTED ON 02/10 AT 1430: PREVIOUSLY REPORTED AS MULTIPLE SPECIES PRESENT, SUGGEST RECOLLECTION  Blood Culture (routine x 2)     Status: None   Collection Time: 01/21/22  1:12 PM   Specimen: BLOOD  Result Value Ref Range Status   Specimen Description BLOOD BLOOD RIGHT ARM  Final   Special Requests   Final    BOTTLES DRAWN AEROBIC AND ANAEROBIC Blood Culture adequate volume   Culture   Final    NO GROWTH 5 DAYS Performed at [facility], [address] [place], [place] [address]    Report Status 01/26/2022 FINAL  Final  Blood Culture (routine x 2)     Status: None   Collection Time: 01/21/22  1:12 PM   Specimen: BLOOD  Result Value Ref Range Status   Specimen Description BLOOD BLOOD RIGHT FOREARM  Final   Special Requests   Final    BOTTLES DRAWN AEROBIC AND ANAEROBIC Blood Culture adequate volume   Culture   Final    NO GROWTH 5 DAYS Performed at [facility], [address] [place], [place] [address]    Report Status 01/26/2022 FINAL  Final  Acid Fast Smear (AFB)     Status: None   Collection Time: 01/21/22  4:02 PM   Specimen: Chest; Body Fluid  Result Value Ref Range Status   AFB Specimen Processing Concentration  Final   Acid Fast Smear Negative  Final    Comment: (NOTE) Performed At: [facility] [place], [place] [id number] Rush Farmer MD OQ:[id number]    Source (AFB) NONE  Final    Comment: Performed at [facility], [address] [place], [place] [address]  Body fluid culture w Gram Stain     Status: None   Collection Time: 01/21/22  4:02 PM   Specimen: Chest; Body Fluid  Result Value Ref Range Status   Specimen Description   Final    CHEST FLUID Performed at [facility], [place] [address],  [place], [place]  [place]    Special Requests   Final    NONE Performed at [facility], [place], [place] [address]    Gram Stain   Final    NO WBC SEEN NO ORGANISMS SEEN Performed at [facility], [place] [address] [place], [place] [address]    Culture   Final    FEW ESCHERICHIA COLI Confirmed Extended Spectrum Beta-Lactamase Producer (ESBL).  In bloodstream infections from ESBL organisms, carbapenems are preferred over piperacillin/tazobactam. They are shown to have a lower risk of mortality.    Report Status 01/25/2022 FINAL  Final   Organism ID, Bacteria ESCHERICHIA COLI  Final      Susceptibility   Escherichia coli - MIC*    AMPICILLIN >=32 RESISTANT Resistant     CEFAZOLIN >=64 RESISTANT Resistant     CEFEPIME 16 RESISTANT Resistant     CEFTAZIDIME RESISTANT Resistant     CEFTRIAXONE >=64 RESISTANT Resistant     CIPROFLOXACIN >=4 RESISTANT Resistant     GENTAMICIN >=16 RESISTANT Resistant     IMIPENEM <=0.25 SENSITIVE Sensitive     TRIMETH/SULFA >=320 RESISTANT Resistant     AMPICILLIN/SULBACTAM >=32 RESISTANT Resistant     PIP/TAZO 8 SENSITIVE Sensitive     * FEW ESCHERICHIA COLI  Aerobic/Anaerobic Culture w Gram Stain (surgical/deep wound)     Status: None   Collection Time: 01/22/22  3:56 PM   Specimen: Pleural Fluid  Result Value Ref Range Status   Specimen Description   Final    PLEURAL RIGHT Performed at [facility], [address], [place], [place] [address]    Special Requests   Final    PLE FLU Performed at [facility], [address] [place], [place] [address]    Gram Stain   Final    RARE WBC PRESENT, PREDOMINANTLY MONONUCLEAR NO ORGANISMS SEEN    Culture   Final    No growth aerobically or anaerobically. Performed at [facility], [place] [address] [place], [place] [address]    Report Status 01/28/2022 FINAL  Final  Resp Panel by RT-PCR (Flu A&B, Covid) Nasopharyngeal Swab     Status: None    Collection Time: 01/23/22  4:52 PM   Specimen: Nasopharyngeal Swab; Nasopharyngeal(NP) swabs in vial transport medium  Result Value Ref Range Status   SARS Coronavirus 2 by RT PCR NEGATIVE NEGATIVE Final    Comment: (NOTE) SARS-CoV-2 target nucleic acids are NOT DETECTED.  The SARS-CoV-2 RNA is generally detectable in upper respiratory specimens during the acute phase of infection. The lowest concentration of SARS-CoV-2 viral copies this assay can detect is 138 copies/mL. A negative result does not preclude SARS-Cov-2 infection and should not be used as the sole basis for treatment or other patient management decisions. A negative result may occur with  improper specimen collection/handling, submission of specimen other than nasopharyngeal swab, presence of viral mutation(s) within the areas targeted by this assay, and inadequate number of viral copies(<138 copies/mL). A negative result must be combined with clinical observations, patient history, and epidemiological information. The expected result is Negative.  Fact Sheet for Patients:  EntrepreneurPulse.com.au  Fact Sheet for Healthcare Providers:  IncredibleEmployment.be  This test is no t yet approved or cleared by the [place] FDA and  has been authorized for detection and/or diagnosis of SARS-CoV-2 by FDA under an Emergency Use Authorization (EUA). This EUA will remain  in effect (meaning this test can be used) for the duration of the COVID-19  declaration under Section 564(b)(1) of the Act, 21 U.S.C.section 360bbb-3(b)(1), unless the authorization is terminated  or revoked sooner.       Influenza A by PCR NEGATIVE NEGATIVE Final   Influenza B by PCR NEGATIVE NEGATIVE Final    Comment: (NOTE) The Xpert Xpress SARS-CoV-2/FLU/RSV plus assay is intended as an aid in the diagnosis of influenza from Nasopharyngeal swab specimens and should not be used as a sole basis for treatment.  Nasal washings and aspirates are unacceptable for Xpert Xpress SARS-CoV-2/FLU/RSV testing.  Fact Sheet for Patients: EntrepreneurPulse.com.au  Fact Sheet for Healthcare Providers: IncredibleEmployment.be  This test is not yet approved or cleared by the [place] FDA and has been authorized for detection and/or diagnosis of SARS-CoV-2 by FDA under an Emergency Use Authorization (EUA). This EUA will remain in effect (meaning this test can be used) for the duration of the COVID-19 declaration under Section 564(b)(1) of the Act, 21 U.S.C. section 360bbb-3(b)(1), unless the authorization is terminated or revoked.  Performed at [facility], [address] [place], [place] [address]   Aerobic/Anaerobic Culture w Gram Stain (surgical/deep wound)     Status: None   Collection Time: 01/25/22 12:00 PM   Specimen: Pleura  Result Value Ref Range Status   Specimen Description   Final    PLEURAL Performed at [facility], [address], [place], [place] [address]    Special Requests   Final    RIGHT CHEST TUBE Performed at [facility], [place], [place] [address]    Gram Stain NO WBC SEEN NO ORGANISMS SEEN   Final   Culture   Final    No growth aerobically or anaerobically. Performed at [facility], [place] [address], [place], [place] [address]    Report Status 01/30/2022 FINAL  Final  Acid Fast Smear (AFB)     Status: None   Collection Time: 01/25/22 12:05 PM   Specimen: Sputum  Result Value Ref Range Status   AFB Specimen Processing Concentration  Final   Acid Fast Smear Negative  Final    Comment: (NOTE) Performed At: [facility] [place], [place] [id number] Rush Farmer MD RA:[id number]    Source (AFB) PLEURAL  Final    Comment: Performed at [facility], [address] [place], [place] [address]    Labs: CBC: Recent Labs  Lab 02/01/22 [phone]  02/02/22 0320 02/03/22 0500 02/04/22 0553 02/05/22 0400  WBC 5.2 6.1 5.2 5.0 4.2  HGB 9.6* 10.0* 8.8* 9.0* 8.8*  HCT 29.2* 31.1* 27.1* 28.2* 28.0*  MCV 92.7 92.3 92.5 92.5 93.6  PLT 192 242 213 211 373   Basic Metabolic Panel: Recent Labs  Lab 01/31/22 0537 02/01/22 0607 02/02/22 0320 02/03/22 0500 02/04/22 0553 02/05/22 0400  NA 137 136 136 134* 135 138  K 3.2* 3.4* 4.2 3.9 3.8 3.7  CL 101 96* 97* 96* 97* 101  CO2 27 31 29 29 27 27   GLUCOSE 96 103* 101* 65* 58* 98  BUN 28* 16 23 14 21  27*  CREATININE 7.05* 4.71* 6.03* 4.35* 5.96* 7.18*  CALCIUM 6.7* 7.4* 7.6* 7.5* 7.1* 6.9*  MG 1.8 1.7 1.8  --   --  1.9  PHOS 3.5 2.6 3.0  --   --  3.8   Liver Function Tests: Recent Labs  Lab 02/04/22 0553  AST 30  ALT 9  ALKPHOS 243*  BILITOT 1.1  PROT 6.1*  ALBUMIN 1.6*   CBG: Recent Labs  Lab 02/04/22 2356 02/05/22 0343 02/05/22 0800 02/05/22 1140 02/05/22 1716  GLUCAP 94 89 93 82 84    Discharge time spent: greater than 30 minutes.  Signed: Fritzi Mandes, MD [facility] 02/06/2022

## 2022-02-13 DEATH — deceased

## 2022-03-07 ENCOUNTER — Ambulatory Visit: Payer: Medicare Other | Admitting: Infectious Diseases

## 2022-03-07 LAB — ACID FAST CULTURE WITH REFLEXED SENSITIVITIES (MYCOBACTERIA): Acid Fast Culture: NEGATIVE

## 2022-03-15 LAB — ACID FAST CULTURE WITH REFLEXED SENSITIVITIES (MYCOBACTERIA): Acid Fast Culture: NEGATIVE

## 2022-03-17 LAB — ACID FAST CULTURE WITH REFLEXED SENSITIVITIES (MYCOBACTERIA)
Acid Fast Culture: NEGATIVE
Acid Fast Culture: NEGATIVE
